# Patient Record
Sex: Female | Born: 1945
Health system: Southern US, Community
[De-identification: ages and names within clinical notes are randomized; demographics above are authoritative.]

## PROBLEM LIST (undated history)

## (undated) DIAGNOSIS — F419 Anxiety disorder, unspecified: Secondary | ICD-10-CM

## (undated) DIAGNOSIS — D490 Neoplasm of unspecified behavior of digestive system: Secondary | ICD-10-CM

## (undated) DIAGNOSIS — F329 Major depressive disorder, single episode, unspecified: Secondary | ICD-10-CM

## (undated) DIAGNOSIS — R0789 Other chest pain: Secondary | ICD-10-CM

## (undated) DIAGNOSIS — D126 Benign neoplasm of colon, unspecified: Secondary | ICD-10-CM

## (undated) DIAGNOSIS — E785 Hyperlipidemia, unspecified: Secondary | ICD-10-CM

## (undated) DIAGNOSIS — E041 Nontoxic single thyroid nodule: Secondary | ICD-10-CM

## (undated) DIAGNOSIS — F32A Depression, unspecified: Secondary | ICD-10-CM

## (undated) DIAGNOSIS — K5909 Other constipation: Secondary | ICD-10-CM

## (undated) DIAGNOSIS — N63 Unspecified lump in unspecified breast: Secondary | ICD-10-CM

## (undated) DIAGNOSIS — K219 Gastro-esophageal reflux disease without esophagitis: Secondary | ICD-10-CM

## (undated) DIAGNOSIS — D493 Neoplasm of unspecified behavior of breast: Secondary | ICD-10-CM

## (undated) DIAGNOSIS — F29 Unspecified psychosis not due to a substance or known physiological condition: Secondary | ICD-10-CM

## (undated) DIAGNOSIS — M549 Dorsalgia, unspecified: Secondary | ICD-10-CM

## (undated) DIAGNOSIS — G43909 Migraine, unspecified, not intractable, without status migrainosus: Secondary | ICD-10-CM

## (undated) DIAGNOSIS — T8859XA Other complications of anesthesia, initial encounter: Secondary | ICD-10-CM

## (undated) DIAGNOSIS — G4700 Insomnia, unspecified: Secondary | ICD-10-CM

## (undated) DIAGNOSIS — G4730 Sleep apnea, unspecified: Secondary | ICD-10-CM

## (undated) DIAGNOSIS — I10 Essential (primary) hypertension: Secondary | ICD-10-CM

## (undated) DIAGNOSIS — Z86018 Personal history of other benign neoplasm: Secondary | ICD-10-CM

## (undated) DIAGNOSIS — K5790 Diverticulosis of intestine, part unspecified, without perforation or abscess without bleeding: Secondary | ICD-10-CM

## (undated) DIAGNOSIS — N289 Disorder of kidney and ureter, unspecified: Secondary | ICD-10-CM

## (undated) DIAGNOSIS — G8929 Other chronic pain: Secondary | ICD-10-CM

## (undated) DIAGNOSIS — T4145XA Adverse effect of unspecified anesthetic, initial encounter: Secondary | ICD-10-CM

## (undated) DIAGNOSIS — M797 Fibromyalgia: Secondary | ICD-10-CM

## (undated) DIAGNOSIS — K449 Diaphragmatic hernia without obstruction or gangrene: Secondary | ICD-10-CM

## (undated) DIAGNOSIS — M359 Systemic involvement of connective tissue, unspecified: Secondary | ICD-10-CM

## (undated) DIAGNOSIS — Z9889 Other specified postprocedural states: Secondary | ICD-10-CM

## (undated) DIAGNOSIS — R112 Nausea with vomiting, unspecified: Secondary | ICD-10-CM

## (undated) HISTORY — PX: TONSILLECTOMY: SUR1361

## (undated) HISTORY — DX: Major depressive disorder, single episode, unspecified: F32.9

## (undated) HISTORY — PX: SHOULDER ARTHROSCOPY: SHX128

## (undated) HISTORY — DX: Other chest pain: R07.89

## (undated) HISTORY — DX: Benign neoplasm of colon, unspecified: D12.6

## (undated) HISTORY — DX: Personal history of other benign neoplasm: Z86.018

## (undated) HISTORY — DX: Depression, unspecified: F32.A

## (undated) HISTORY — PX: COLONOSCOPY: SHX174

## (undated) HISTORY — DX: Other constipation: K59.09

## (undated) HISTORY — DX: Nontoxic single thyroid nodule: E04.1

## (undated) HISTORY — DX: Hyperlipidemia, unspecified: E78.5

## (undated) HISTORY — PX: RIGHT OOPHORECTOMY: SHX2359

## (undated) HISTORY — PX: PARATHYROIDECTOMY: SHX19

## (undated) HISTORY — DX: Fibromyalgia: M79.7

## (undated) HISTORY — DX: Diaphragmatic hernia without obstruction or gangrene: K44.9

## (undated) HISTORY — PX: ABDOMINAL HYSTERECTOMY: SHX81

## (undated) HISTORY — DX: Unspecified psychosis not due to a substance or known physiological condition: F29

## (undated) HISTORY — DX: Dorsalgia, unspecified: M54.9

## (undated) HISTORY — DX: Insomnia, unspecified: G47.00

## (undated) HISTORY — DX: Neoplasm of unspecified behavior of breast: D49.3

## (undated) HISTORY — DX: Diverticulosis of intestine, part unspecified, without perforation or abscess without bleeding: K57.90

## (undated) HISTORY — DX: Anxiety disorder, unspecified: F41.9

## (undated) HISTORY — DX: Gastro-esophageal reflux disease without esophagitis: K21.9

## (undated) HISTORY — DX: Other chronic pain: G89.29

## (undated) HISTORY — DX: Essential (primary) hypertension: I10

## (undated) HISTORY — DX: Migraine, unspecified, not intractable, without status migrainosus: G43.909

## (undated) HISTORY — PX: LUNG BIOPSY: SHX232

## (undated) HISTORY — PX: BREAST EXCISIONAL BIOPSY: SUR124

---

## 1998-12-29 ENCOUNTER — Encounter: Payer: Self-pay | Admitting: *Deleted

## 1998-12-29 ENCOUNTER — Ambulatory Visit (HOSPITAL_COMMUNITY): Admission: RE | Admit: 1998-12-29 | Discharge: 1998-12-29 | Payer: Self-pay | Admitting: *Deleted

## 1999-04-16 ENCOUNTER — Ambulatory Visit (HOSPITAL_BASED_OUTPATIENT_CLINIC_OR_DEPARTMENT_OTHER): Admission: RE | Admit: 1999-04-16 | Discharge: 1999-04-16 | Payer: Self-pay | Admitting: Orthopedic Surgery

## 2000-06-24 HISTORY — PX: TOTAL KNEE ARTHROPLASTY: SHX125

## 2000-10-22 ENCOUNTER — Ambulatory Visit (HOSPITAL_COMMUNITY): Admission: RE | Admit: 2000-10-22 | Discharge: 2000-10-22 | Payer: Self-pay | Admitting: General Surgery

## 2000-10-22 ENCOUNTER — Encounter: Payer: Self-pay | Admitting: General Surgery

## 2000-12-09 ENCOUNTER — Encounter: Payer: Self-pay | Admitting: Internal Medicine

## 2000-12-09 ENCOUNTER — Ambulatory Visit (HOSPITAL_COMMUNITY): Admission: RE | Admit: 2000-12-09 | Discharge: 2000-12-09 | Payer: Self-pay | Admitting: Internal Medicine

## 2001-01-20 ENCOUNTER — Ambulatory Visit (HOSPITAL_COMMUNITY): Admission: RE | Admit: 2001-01-20 | Discharge: 2001-01-20 | Payer: Self-pay | Admitting: Internal Medicine

## 2001-01-20 ENCOUNTER — Encounter: Payer: Self-pay | Admitting: Internal Medicine

## 2001-02-10 ENCOUNTER — Encounter: Payer: Self-pay | Admitting: Specialist

## 2001-02-16 ENCOUNTER — Inpatient Hospital Stay (HOSPITAL_COMMUNITY): Admission: RE | Admit: 2001-02-16 | Discharge: 2001-02-21 | Payer: Self-pay | Admitting: Specialist

## 2001-02-16 ENCOUNTER — Encounter: Payer: Self-pay | Admitting: Specialist

## 2001-06-24 ENCOUNTER — Encounter: Payer: Self-pay | Admitting: Emergency Medicine

## 2001-06-24 ENCOUNTER — Emergency Department (HOSPITAL_COMMUNITY): Admission: EM | Admit: 2001-06-24 | Discharge: 2001-06-24 | Payer: Self-pay | Admitting: Emergency Medicine

## 2001-06-25 ENCOUNTER — Encounter: Payer: Self-pay | Admitting: *Deleted

## 2001-06-25 ENCOUNTER — Emergency Department (HOSPITAL_COMMUNITY): Admission: EM | Admit: 2001-06-25 | Discharge: 2001-06-25 | Payer: Self-pay | Admitting: *Deleted

## 2001-12-30 ENCOUNTER — Observation Stay (HOSPITAL_COMMUNITY): Admission: EM | Admit: 2001-12-30 | Discharge: 2002-01-01 | Payer: Self-pay | Admitting: Internal Medicine

## 2001-12-30 ENCOUNTER — Encounter: Payer: Self-pay | Admitting: Internal Medicine

## 2002-01-14 ENCOUNTER — Encounter (HOSPITAL_COMMUNITY): Admission: RE | Admit: 2002-01-14 | Discharge: 2002-02-13 | Payer: Self-pay | Admitting: Specialist

## 2002-01-25 ENCOUNTER — Encounter: Payer: Self-pay | Admitting: Emergency Medicine

## 2002-01-25 ENCOUNTER — Emergency Department (HOSPITAL_COMMUNITY): Admission: EM | Admit: 2002-01-25 | Discharge: 2002-01-25 | Payer: Self-pay | Admitting: Emergency Medicine

## 2002-02-09 ENCOUNTER — Encounter: Payer: Self-pay | Admitting: Emergency Medicine

## 2002-02-09 ENCOUNTER — Emergency Department (HOSPITAL_COMMUNITY): Admission: EM | Admit: 2002-02-09 | Discharge: 2002-02-09 | Payer: Self-pay | Admitting: Emergency Medicine

## 2002-07-30 ENCOUNTER — Encounter: Payer: Self-pay | Admitting: Internal Medicine

## 2002-07-30 ENCOUNTER — Ambulatory Visit (HOSPITAL_COMMUNITY): Admission: RE | Admit: 2002-07-30 | Discharge: 2002-07-30 | Payer: Self-pay | Admitting: Internal Medicine

## 2003-04-13 ENCOUNTER — Ambulatory Visit (HOSPITAL_COMMUNITY): Admission: RE | Admit: 2003-04-13 | Discharge: 2003-04-13 | Payer: Self-pay | Admitting: *Deleted

## 2003-04-13 ENCOUNTER — Encounter: Payer: Self-pay | Admitting: *Deleted

## 2003-04-29 ENCOUNTER — Ambulatory Visit (HOSPITAL_COMMUNITY): Admission: RE | Admit: 2003-04-29 | Discharge: 2003-04-29 | Payer: Self-pay | Admitting: Internal Medicine

## 2003-05-10 ENCOUNTER — Ambulatory Visit (HOSPITAL_COMMUNITY): Admission: RE | Admit: 2003-05-10 | Discharge: 2003-05-10 | Payer: Self-pay | Admitting: Internal Medicine

## 2003-09-19 ENCOUNTER — Ambulatory Visit (HOSPITAL_COMMUNITY): Admission: RE | Admit: 2003-09-19 | Discharge: 2003-09-19 | Payer: Self-pay | Admitting: Internal Medicine

## 2003-12-19 ENCOUNTER — Emergency Department (HOSPITAL_COMMUNITY): Admission: EM | Admit: 2003-12-19 | Discharge: 2003-12-19 | Payer: Self-pay | Admitting: Emergency Medicine

## 2004-01-20 ENCOUNTER — Ambulatory Visit (HOSPITAL_COMMUNITY): Admission: RE | Admit: 2004-01-20 | Discharge: 2004-01-20 | Payer: Self-pay | Admitting: Internal Medicine

## 2004-02-23 ENCOUNTER — Ambulatory Visit (HOSPITAL_COMMUNITY): Payer: Self-pay | Admitting: Psychiatry

## 2004-03-20 ENCOUNTER — Ambulatory Visit: Payer: Self-pay | Admitting: Psychiatry

## 2004-05-08 ENCOUNTER — Ambulatory Visit (HOSPITAL_COMMUNITY): Admission: RE | Admit: 2004-05-08 | Discharge: 2004-05-08 | Payer: Self-pay | Admitting: Family Medicine

## 2004-05-31 ENCOUNTER — Ambulatory Visit: Payer: Self-pay | Admitting: Psychiatry

## 2004-06-24 DIAGNOSIS — D126 Benign neoplasm of colon, unspecified: Secondary | ICD-10-CM

## 2004-06-24 HISTORY — DX: Benign neoplasm of colon, unspecified: D12.6

## 2004-08-28 ENCOUNTER — Ambulatory Visit (HOSPITAL_COMMUNITY): Admission: RE | Admit: 2004-08-28 | Discharge: 2004-08-28 | Payer: Self-pay | Admitting: Family Medicine

## 2004-09-20 ENCOUNTER — Ambulatory Visit (HOSPITAL_COMMUNITY): Admission: RE | Admit: 2004-09-20 | Discharge: 2004-09-20 | Payer: Self-pay | Admitting: Family Medicine

## 2004-10-04 ENCOUNTER — Ambulatory Visit: Payer: Self-pay | Admitting: Psychiatry

## 2004-10-12 ENCOUNTER — Ambulatory Visit (HOSPITAL_COMMUNITY): Admission: RE | Admit: 2004-10-12 | Discharge: 2004-10-12 | Payer: Self-pay | Admitting: Internal Medicine

## 2004-11-01 ENCOUNTER — Ambulatory Visit: Payer: Self-pay | Admitting: Orthopedic Surgery

## 2004-11-05 ENCOUNTER — Ambulatory Visit (HOSPITAL_COMMUNITY): Admission: RE | Admit: 2004-11-05 | Discharge: 2004-11-05 | Payer: Self-pay | Admitting: Orthopedic Surgery

## 2004-12-03 ENCOUNTER — Ambulatory Visit: Payer: Self-pay | Admitting: Orthopedic Surgery

## 2004-12-06 ENCOUNTER — Encounter (HOSPITAL_COMMUNITY): Admission: RE | Admit: 2004-12-06 | Discharge: 2005-01-05 | Payer: Self-pay | Admitting: Orthopedic Surgery

## 2004-12-13 ENCOUNTER — Ambulatory Visit: Payer: Self-pay | Admitting: Psychiatry

## 2005-01-07 ENCOUNTER — Encounter (HOSPITAL_COMMUNITY): Admission: RE | Admit: 2005-01-07 | Discharge: 2005-02-06 | Payer: Self-pay | Admitting: Orthopedic Surgery

## 2005-02-26 ENCOUNTER — Ambulatory Visit: Payer: Self-pay | Admitting: Psychiatry

## 2005-04-11 ENCOUNTER — Ambulatory Visit (HOSPITAL_COMMUNITY): Admission: RE | Admit: 2005-04-11 | Discharge: 2005-04-11 | Payer: Self-pay | Admitting: Family Medicine

## 2005-05-23 ENCOUNTER — Ambulatory Visit: Payer: Self-pay | Admitting: Psychiatry

## 2005-05-28 ENCOUNTER — Ambulatory Visit: Payer: Self-pay | Admitting: Internal Medicine

## 2005-06-05 ENCOUNTER — Encounter: Payer: Self-pay | Admitting: Internal Medicine

## 2005-06-05 ENCOUNTER — Ambulatory Visit (HOSPITAL_COMMUNITY): Admission: RE | Admit: 2005-06-05 | Discharge: 2005-06-05 | Payer: Self-pay | Admitting: Internal Medicine

## 2005-06-05 ENCOUNTER — Ambulatory Visit: Payer: Self-pay | Admitting: Internal Medicine

## 2005-07-24 ENCOUNTER — Ambulatory Visit: Payer: Self-pay | Admitting: Psychiatry

## 2005-07-30 ENCOUNTER — Ambulatory Visit: Payer: Self-pay | Admitting: Psychiatry

## 2005-08-30 ENCOUNTER — Ambulatory Visit (HOSPITAL_COMMUNITY): Payer: Self-pay | Admitting: Psychiatry

## 2005-09-16 ENCOUNTER — Ambulatory Visit (HOSPITAL_COMMUNITY): Payer: Self-pay | Admitting: Psychiatry

## 2005-09-17 ENCOUNTER — Ambulatory Visit (HOSPITAL_COMMUNITY): Payer: Self-pay | Admitting: Psychiatry

## 2005-09-23 ENCOUNTER — Ambulatory Visit (HOSPITAL_COMMUNITY): Admission: RE | Admit: 2005-09-23 | Discharge: 2005-09-23 | Payer: Self-pay | Admitting: Family Medicine

## 2005-10-08 ENCOUNTER — Ambulatory Visit (HOSPITAL_COMMUNITY): Payer: Self-pay | Admitting: Psychiatry

## 2005-11-01 ENCOUNTER — Ambulatory Visit: Payer: Self-pay | Admitting: Internal Medicine

## 2005-11-08 ENCOUNTER — Ambulatory Visit (HOSPITAL_COMMUNITY): Payer: Self-pay | Admitting: Psychiatry

## 2005-11-21 ENCOUNTER — Ambulatory Visit (HOSPITAL_COMMUNITY): Payer: Self-pay | Admitting: Psychiatry

## 2005-12-06 ENCOUNTER — Ambulatory Visit (HOSPITAL_COMMUNITY): Payer: Self-pay | Admitting: Psychiatry

## 2006-01-06 ENCOUNTER — Ambulatory Visit (HOSPITAL_COMMUNITY): Payer: Self-pay | Admitting: Psychiatry

## 2006-01-16 ENCOUNTER — Ambulatory Visit (HOSPITAL_COMMUNITY): Payer: Self-pay | Admitting: Psychiatry

## 2006-02-13 ENCOUNTER — Ambulatory Visit (HOSPITAL_COMMUNITY): Payer: Self-pay | Admitting: Psychiatry

## 2006-03-18 ENCOUNTER — Ambulatory Visit (HOSPITAL_COMMUNITY): Payer: Self-pay | Admitting: Psychiatry

## 2006-03-25 ENCOUNTER — Ambulatory Visit (HOSPITAL_COMMUNITY): Payer: Self-pay | Admitting: Psychiatry

## 2006-04-29 ENCOUNTER — Ambulatory Visit (HOSPITAL_COMMUNITY): Payer: Self-pay | Admitting: Psychiatry

## 2006-05-13 ENCOUNTER — Ambulatory Visit (HOSPITAL_COMMUNITY): Payer: Self-pay | Admitting: Psychiatry

## 2006-05-29 ENCOUNTER — Ambulatory Visit (HOSPITAL_COMMUNITY): Payer: Self-pay | Admitting: Psychiatry

## 2006-06-12 ENCOUNTER — Ambulatory Visit (HOSPITAL_COMMUNITY): Payer: Self-pay | Admitting: Psychiatry

## 2006-06-27 ENCOUNTER — Ambulatory Visit (HOSPITAL_COMMUNITY): Payer: Self-pay | Admitting: Psychiatry

## 2006-07-30 ENCOUNTER — Ambulatory Visit (HOSPITAL_COMMUNITY): Payer: Self-pay | Admitting: Psychiatry

## 2006-08-12 ENCOUNTER — Ambulatory Visit (HOSPITAL_COMMUNITY): Payer: Self-pay | Admitting: Psychiatry

## 2006-08-27 ENCOUNTER — Ambulatory Visit (HOSPITAL_COMMUNITY): Payer: Self-pay | Admitting: Psychiatry

## 2006-09-23 ENCOUNTER — Ambulatory Visit (HOSPITAL_COMMUNITY): Payer: Self-pay | Admitting: Psychiatry

## 2006-10-01 ENCOUNTER — Ambulatory Visit (HOSPITAL_COMMUNITY): Admission: RE | Admit: 2006-10-01 | Discharge: 2006-10-01 | Payer: Self-pay | Admitting: Family Medicine

## 2006-10-07 ENCOUNTER — Ambulatory Visit (HOSPITAL_COMMUNITY): Payer: Self-pay | Admitting: Psychiatry

## 2006-10-21 ENCOUNTER — Ambulatory Visit (HOSPITAL_COMMUNITY): Payer: Self-pay | Admitting: Psychiatry

## 2006-11-21 ENCOUNTER — Ambulatory Visit (HOSPITAL_COMMUNITY): Payer: Self-pay | Admitting: Psychiatry

## 2006-12-19 ENCOUNTER — Ambulatory Visit (HOSPITAL_COMMUNITY): Payer: Self-pay | Admitting: Psychiatry

## 2006-12-30 ENCOUNTER — Ambulatory Visit (HOSPITAL_COMMUNITY): Payer: Self-pay | Admitting: Psychiatry

## 2007-01-16 ENCOUNTER — Ambulatory Visit (HOSPITAL_COMMUNITY): Payer: Self-pay | Admitting: Psychiatry

## 2007-01-27 ENCOUNTER — Ambulatory Visit (HOSPITAL_COMMUNITY): Payer: Self-pay | Admitting: Psychiatry

## 2007-02-13 ENCOUNTER — Ambulatory Visit (HOSPITAL_COMMUNITY): Payer: Self-pay | Admitting: Psychiatry

## 2007-02-26 ENCOUNTER — Ambulatory Visit (HOSPITAL_COMMUNITY): Payer: Self-pay | Admitting: Psychiatry

## 2007-03-02 ENCOUNTER — Ambulatory Visit (HOSPITAL_COMMUNITY): Payer: Self-pay | Admitting: Psychiatry

## 2007-03-26 ENCOUNTER — Ambulatory Visit (HOSPITAL_COMMUNITY): Payer: Self-pay | Admitting: Psychiatry

## 2007-03-30 ENCOUNTER — Ambulatory Visit (HOSPITAL_COMMUNITY): Payer: Self-pay | Admitting: Psychiatry

## 2007-05-08 ENCOUNTER — Ambulatory Visit (HOSPITAL_COMMUNITY): Payer: Self-pay | Admitting: Psychiatry

## 2007-05-18 ENCOUNTER — Ambulatory Visit (HOSPITAL_COMMUNITY): Admission: RE | Admit: 2007-05-18 | Discharge: 2007-05-18 | Payer: Self-pay | Admitting: Family Medicine

## 2007-05-19 ENCOUNTER — Ambulatory Visit (HOSPITAL_COMMUNITY): Payer: Self-pay | Admitting: Psychiatry

## 2007-05-26 ENCOUNTER — Ambulatory Visit (HOSPITAL_COMMUNITY): Payer: Self-pay | Admitting: Psychiatry

## 2007-06-03 ENCOUNTER — Ambulatory Visit (HOSPITAL_COMMUNITY): Payer: Self-pay | Admitting: Psychiatry

## 2007-06-24 ENCOUNTER — Ambulatory Visit (HOSPITAL_COMMUNITY): Payer: Self-pay | Admitting: Psychiatry

## 2007-07-08 ENCOUNTER — Ambulatory Visit (HOSPITAL_COMMUNITY): Payer: Self-pay | Admitting: Psychiatry

## 2007-07-09 ENCOUNTER — Ambulatory Visit (HOSPITAL_COMMUNITY): Payer: Self-pay | Admitting: Psychiatry

## 2007-07-29 ENCOUNTER — Ambulatory Visit (HOSPITAL_COMMUNITY): Payer: Self-pay | Admitting: Psychiatry

## 2007-08-06 ENCOUNTER — Ambulatory Visit (HOSPITAL_COMMUNITY): Payer: Self-pay | Admitting: Psychiatry

## 2007-09-01 ENCOUNTER — Ambulatory Visit (HOSPITAL_COMMUNITY): Payer: Self-pay | Admitting: Psychiatry

## 2007-09-17 ENCOUNTER — Ambulatory Visit (HOSPITAL_COMMUNITY): Payer: Self-pay | Admitting: Psychiatry

## 2007-09-28 ENCOUNTER — Ambulatory Visit (HOSPITAL_COMMUNITY): Payer: Self-pay | Admitting: Psychiatry

## 2007-10-02 ENCOUNTER — Ambulatory Visit (HOSPITAL_COMMUNITY): Admission: RE | Admit: 2007-10-02 | Discharge: 2007-10-02 | Payer: Self-pay | Admitting: Family Medicine

## 2007-10-27 ENCOUNTER — Ambulatory Visit (HOSPITAL_COMMUNITY): Payer: Self-pay | Admitting: Psychiatry

## 2007-11-17 ENCOUNTER — Ambulatory Visit (HOSPITAL_COMMUNITY): Payer: Self-pay | Admitting: Psychiatry

## 2007-11-23 ENCOUNTER — Ambulatory Visit (HOSPITAL_COMMUNITY): Payer: Self-pay | Admitting: Psychiatry

## 2007-12-23 ENCOUNTER — Ambulatory Visit (HOSPITAL_COMMUNITY): Payer: Self-pay | Admitting: Psychiatry

## 2008-01-14 ENCOUNTER — Ambulatory Visit (HOSPITAL_COMMUNITY): Payer: Self-pay | Admitting: Psychiatry

## 2008-01-27 ENCOUNTER — Ambulatory Visit (HOSPITAL_COMMUNITY): Payer: Self-pay | Admitting: Psychiatry

## 2008-02-09 ENCOUNTER — Ambulatory Visit (HOSPITAL_COMMUNITY): Payer: Self-pay | Admitting: Psychiatry

## 2008-02-23 ENCOUNTER — Ambulatory Visit (HOSPITAL_COMMUNITY): Payer: Self-pay | Admitting: Psychiatry

## 2008-03-22 ENCOUNTER — Ambulatory Visit (HOSPITAL_COMMUNITY): Payer: Self-pay | Admitting: Psychiatry

## 2008-04-07 ENCOUNTER — Ambulatory Visit (HOSPITAL_COMMUNITY): Payer: Self-pay | Admitting: Psychiatry

## 2008-04-21 ENCOUNTER — Ambulatory Visit (HOSPITAL_COMMUNITY): Payer: Self-pay | Admitting: Psychiatry

## 2008-05-18 ENCOUNTER — Ambulatory Visit (HOSPITAL_COMMUNITY): Payer: Self-pay | Admitting: Psychiatry

## 2008-06-09 ENCOUNTER — Ambulatory Visit: Payer: Self-pay | Admitting: Internal Medicine

## 2008-06-13 ENCOUNTER — Ambulatory Visit (HOSPITAL_COMMUNITY): Payer: Self-pay | Admitting: Psychiatry

## 2008-06-24 DIAGNOSIS — Z86018 Personal history of other benign neoplasm: Secondary | ICD-10-CM

## 2008-06-24 HISTORY — DX: Personal history of other benign neoplasm: Z86.018

## 2008-06-28 ENCOUNTER — Ambulatory Visit (HOSPITAL_COMMUNITY): Payer: Self-pay | Admitting: Psychiatry

## 2008-07-04 ENCOUNTER — Ambulatory Visit (HOSPITAL_COMMUNITY): Admission: RE | Admit: 2008-07-04 | Discharge: 2008-07-04 | Payer: Self-pay | Admitting: Internal Medicine

## 2008-07-04 ENCOUNTER — Encounter: Payer: Self-pay | Admitting: Internal Medicine

## 2008-07-04 ENCOUNTER — Ambulatory Visit: Payer: Self-pay | Admitting: Internal Medicine

## 2008-07-11 ENCOUNTER — Ambulatory Visit (HOSPITAL_COMMUNITY): Payer: Self-pay | Admitting: Psychiatry

## 2008-08-01 ENCOUNTER — Ambulatory Visit (HOSPITAL_COMMUNITY): Payer: Self-pay | Admitting: Psychiatry

## 2008-08-25 ENCOUNTER — Ambulatory Visit: Payer: Self-pay | Admitting: Cardiology

## 2008-08-25 ENCOUNTER — Encounter (INDEPENDENT_AMBULATORY_CARE_PROVIDER_SITE_OTHER): Payer: Self-pay | Admitting: *Deleted

## 2008-08-25 LAB — CONVERTED CEMR LAB
ALT: 12 units/L
ALT: 12 units/L
AST: 16 units/L
AST: 16 units/L
Albumin: 4.5 g/dL
Albumin: 4.5 g/dL
Alkaline Phosphatase: 58 units/L
Alkaline Phosphatase: 58 units/L
BUN: 6 mg/dL
BUN: 6 mg/dL
CO2: 23 meq/L
CO2: 23 meq/L
Calcium: 9.8 mg/dL
Calcium: 9.8 mg/dL
Chloride: 104 meq/L
Chloride: 104 meq/L
Creatinine, Ser: 0.79 mg/dL
Creatinine, Ser: 0.8 mg/dL
Glucose, Bld: 87 mg/dL
Glucose, Bld: 87 mg/dL
HCT: 39.4 %
HCT: 39.4 %
Hemoglobin: 13.4 g/dL
Hemoglobin: 13.4 g/dL
MCV: 81 fL
MCV: 81.1 fL
Platelets: 194 10*3/uL
Platelets: 194 10*3/uL
Potassium: 3.6 meq/L
Potassium: 3.6 meq/L
Sodium: 142 meq/L
Sodium: 142 meq/L
TSH: 1.234 microintl units/mL
Total Protein: 6.9 g/dL
Total Protein: 6.9 g/dL
WBC: 5 10*3/uL
WBC: 5 10*3/uL

## 2008-09-05 ENCOUNTER — Ambulatory Visit (HOSPITAL_COMMUNITY): Payer: Self-pay | Admitting: Psychiatry

## 2008-09-07 ENCOUNTER — Ambulatory Visit (HOSPITAL_COMMUNITY): Admission: RE | Admit: 2008-09-07 | Discharge: 2008-09-07 | Payer: Self-pay | Admitting: Cardiology

## 2008-09-07 ENCOUNTER — Ambulatory Visit: Payer: Self-pay | Admitting: Cardiology

## 2008-09-07 ENCOUNTER — Encounter: Payer: Self-pay | Admitting: Cardiology

## 2008-09-20 ENCOUNTER — Ambulatory Visit: Payer: Self-pay | Admitting: Cardiology

## 2008-09-26 ENCOUNTER — Ambulatory Visit (HOSPITAL_COMMUNITY): Payer: Self-pay | Admitting: Psychiatry

## 2008-09-28 ENCOUNTER — Ambulatory Visit: Payer: Self-pay | Admitting: Cardiology

## 2008-09-29 ENCOUNTER — Ambulatory Visit (HOSPITAL_COMMUNITY): Payer: Self-pay | Admitting: Psychiatry

## 2008-10-03 ENCOUNTER — Ambulatory Visit (HOSPITAL_COMMUNITY): Admission: RE | Admit: 2008-10-03 | Discharge: 2008-10-03 | Payer: Self-pay | Admitting: Obstetrics & Gynecology

## 2008-11-01 ENCOUNTER — Ambulatory Visit (HOSPITAL_COMMUNITY): Payer: Self-pay | Admitting: Psychiatry

## 2008-11-29 ENCOUNTER — Ambulatory Visit (HOSPITAL_COMMUNITY): Payer: Self-pay | Admitting: Psychiatry

## 2009-01-03 ENCOUNTER — Ambulatory Visit (HOSPITAL_COMMUNITY): Payer: Self-pay | Admitting: Psychiatry

## 2009-01-18 ENCOUNTER — Ambulatory Visit (HOSPITAL_COMMUNITY): Payer: Self-pay | Admitting: Psychiatry

## 2009-01-25 ENCOUNTER — Encounter: Payer: Self-pay | Admitting: Gastroenterology

## 2009-02-15 ENCOUNTER — Ambulatory Visit (HOSPITAL_COMMUNITY): Payer: Self-pay | Admitting: Psychiatry

## 2009-03-22 ENCOUNTER — Ambulatory Visit (HOSPITAL_COMMUNITY): Payer: Self-pay | Admitting: Psychiatry

## 2009-04-19 ENCOUNTER — Ambulatory Visit (HOSPITAL_COMMUNITY): Payer: Self-pay | Admitting: Psychiatry

## 2009-04-25 ENCOUNTER — Ambulatory Visit (HOSPITAL_COMMUNITY): Payer: Self-pay | Admitting: Psychiatry

## 2009-05-15 ENCOUNTER — Ambulatory Visit (HOSPITAL_COMMUNITY): Payer: Self-pay | Admitting: Psychiatry

## 2009-06-12 ENCOUNTER — Ambulatory Visit (HOSPITAL_COMMUNITY): Payer: Self-pay | Admitting: Psychiatry

## 2009-06-15 LAB — CONVERTED CEMR LAB
ALT: 12 units/L
AST: 18 units/L
Albumin: 4.4 g/dL
Alkaline Phosphatase: 73 units/L
Bilirubin, Direct: 0.19 mg/dL
Cholesterol: 176 mg/dL
HDL: 65 mg/dL
LDL Cholesterol: 99 mg/dL
Total Protein: 6.6 g/dL
Triglycerides: 58 mg/dL

## 2009-07-10 ENCOUNTER — Ambulatory Visit (HOSPITAL_COMMUNITY): Payer: Self-pay | Admitting: Psychiatry

## 2009-07-25 ENCOUNTER — Ambulatory Visit (HOSPITAL_COMMUNITY): Payer: Self-pay | Admitting: Psychiatry

## 2009-08-04 ENCOUNTER — Encounter: Payer: Self-pay | Admitting: Internal Medicine

## 2009-08-04 ENCOUNTER — Ambulatory Visit (HOSPITAL_COMMUNITY): Payer: Self-pay | Admitting: Psychiatry

## 2009-08-16 DIAGNOSIS — I10 Essential (primary) hypertension: Secondary | ICD-10-CM | POA: Insufficient documentation

## 2009-08-16 DIAGNOSIS — E041 Nontoxic single thyroid nodule: Secondary | ICD-10-CM | POA: Insufficient documentation

## 2009-08-16 DIAGNOSIS — M1990 Unspecified osteoarthritis, unspecified site: Secondary | ICD-10-CM | POA: Insufficient documentation

## 2009-08-16 DIAGNOSIS — K219 Gastro-esophageal reflux disease without esophagitis: Secondary | ICD-10-CM | POA: Insufficient documentation

## 2009-08-16 DIAGNOSIS — F32A Depression, unspecified: Secondary | ICD-10-CM | POA: Insufficient documentation

## 2009-08-16 DIAGNOSIS — M549 Dorsalgia, unspecified: Secondary | ICD-10-CM | POA: Insufficient documentation

## 2009-08-16 DIAGNOSIS — IMO0001 Reserved for inherently not codable concepts without codable children: Secondary | ICD-10-CM | POA: Insufficient documentation

## 2009-08-16 DIAGNOSIS — F419 Anxiety disorder, unspecified: Secondary | ICD-10-CM

## 2009-08-16 DIAGNOSIS — F329 Major depressive disorder, single episode, unspecified: Secondary | ICD-10-CM | POA: Insufficient documentation

## 2009-08-22 ENCOUNTER — Ambulatory Visit: Payer: Self-pay | Admitting: Cardiology

## 2009-08-22 DIAGNOSIS — E782 Mixed hyperlipidemia: Secondary | ICD-10-CM | POA: Insufficient documentation

## 2009-08-22 DIAGNOSIS — E785 Hyperlipidemia, unspecified: Secondary | ICD-10-CM | POA: Insufficient documentation

## 2009-08-22 DIAGNOSIS — R002 Palpitations: Secondary | ICD-10-CM | POA: Insufficient documentation

## 2009-08-28 ENCOUNTER — Encounter: Payer: Self-pay | Admitting: Cardiology

## 2009-08-31 ENCOUNTER — Encounter: Payer: Self-pay | Admitting: Internal Medicine

## 2009-09-01 ENCOUNTER — Ambulatory Visit (HOSPITAL_COMMUNITY): Payer: Self-pay | Admitting: Psychiatry

## 2009-09-29 ENCOUNTER — Ambulatory Visit (HOSPITAL_COMMUNITY): Payer: Self-pay | Admitting: Psychiatry

## 2009-10-10 ENCOUNTER — Ambulatory Visit (HOSPITAL_COMMUNITY): Admission: RE | Admit: 2009-10-10 | Discharge: 2009-10-10 | Payer: Self-pay | Admitting: Obstetrics & Gynecology

## 2009-10-18 ENCOUNTER — Encounter: Payer: Self-pay | Admitting: Internal Medicine

## 2009-10-20 ENCOUNTER — Encounter (INDEPENDENT_AMBULATORY_CARE_PROVIDER_SITE_OTHER): Payer: Self-pay | Admitting: *Deleted

## 2009-10-24 ENCOUNTER — Ambulatory Visit (HOSPITAL_COMMUNITY): Payer: Self-pay | Admitting: Psychiatry

## 2009-10-30 ENCOUNTER — Ambulatory Visit (HOSPITAL_COMMUNITY): Payer: Self-pay | Admitting: Psychiatry

## 2009-10-31 ENCOUNTER — Ambulatory Visit: Payer: Self-pay | Admitting: Internal Medicine

## 2009-11-06 ENCOUNTER — Emergency Department (HOSPITAL_COMMUNITY): Admission: EM | Admit: 2009-11-06 | Discharge: 2009-11-06 | Payer: Self-pay | Admitting: Emergency Medicine

## 2009-11-09 DIAGNOSIS — D375 Neoplasm of uncertain behavior of rectum: Secondary | ICD-10-CM

## 2009-11-09 DIAGNOSIS — D371 Neoplasm of uncertain behavior of stomach: Secondary | ICD-10-CM | POA: Insufficient documentation

## 2009-11-09 DIAGNOSIS — K5900 Constipation, unspecified: Secondary | ICD-10-CM | POA: Insufficient documentation

## 2009-11-09 DIAGNOSIS — D378 Neoplasm of uncertain behavior of other specified digestive organs: Secondary | ICD-10-CM

## 2009-11-09 DIAGNOSIS — R1310 Dysphagia, unspecified: Secondary | ICD-10-CM | POA: Insufficient documentation

## 2009-11-10 ENCOUNTER — Ambulatory Visit (HOSPITAL_COMMUNITY): Admission: RE | Admit: 2009-11-10 | Discharge: 2009-11-10 | Payer: Self-pay | Admitting: Internal Medicine

## 2009-11-10 ENCOUNTER — Encounter: Payer: Self-pay | Admitting: Internal Medicine

## 2009-11-14 ENCOUNTER — Telehealth (INDEPENDENT_AMBULATORY_CARE_PROVIDER_SITE_OTHER): Payer: Self-pay

## 2009-11-15 ENCOUNTER — Encounter: Payer: Self-pay | Admitting: Internal Medicine

## 2009-11-15 ENCOUNTER — Encounter (INDEPENDENT_AMBULATORY_CARE_PROVIDER_SITE_OTHER): Payer: Self-pay | Admitting: *Deleted

## 2009-11-23 ENCOUNTER — Encounter: Payer: Self-pay | Admitting: Gastroenterology

## 2009-11-27 ENCOUNTER — Ambulatory Visit (HOSPITAL_COMMUNITY): Payer: Self-pay | Admitting: Psychiatry

## 2009-12-26 ENCOUNTER — Ambulatory Visit (HOSPITAL_COMMUNITY): Payer: Self-pay | Admitting: Psychiatry

## 2010-01-23 ENCOUNTER — Ambulatory Visit (HOSPITAL_COMMUNITY): Payer: Self-pay | Admitting: Psychiatry

## 2010-02-02 ENCOUNTER — Ambulatory Visit (HOSPITAL_COMMUNITY): Payer: Self-pay | Admitting: Psychiatry

## 2010-02-27 ENCOUNTER — Encounter (INDEPENDENT_AMBULATORY_CARE_PROVIDER_SITE_OTHER): Payer: Self-pay | Admitting: *Deleted

## 2010-03-02 ENCOUNTER — Ambulatory Visit (HOSPITAL_COMMUNITY): Payer: Self-pay | Admitting: Psychiatry

## 2010-03-27 ENCOUNTER — Ambulatory Visit (HOSPITAL_COMMUNITY): Payer: Self-pay | Admitting: Psychiatry

## 2010-03-30 ENCOUNTER — Ambulatory Visit (HOSPITAL_COMMUNITY): Payer: Self-pay | Admitting: Psychiatry

## 2010-04-02 ENCOUNTER — Ambulatory Visit (HOSPITAL_COMMUNITY): Admission: RE | Admit: 2010-04-02 | Discharge: 2010-04-02 | Payer: Self-pay | Admitting: Internal Medicine

## 2010-04-03 ENCOUNTER — Ambulatory Visit: Payer: Self-pay | Admitting: Internal Medicine

## 2010-04-27 ENCOUNTER — Ambulatory Visit (HOSPITAL_COMMUNITY): Payer: Self-pay | Admitting: Psychiatry

## 2010-05-01 ENCOUNTER — Ambulatory Visit: Payer: Self-pay | Admitting: Orthopedic Surgery

## 2010-05-01 DIAGNOSIS — M5137 Other intervertebral disc degeneration, lumbosacral region: Secondary | ICD-10-CM | POA: Insufficient documentation

## 2010-05-01 DIAGNOSIS — M7660 Achilles tendinitis, unspecified leg: Secondary | ICD-10-CM | POA: Insufficient documentation

## 2010-05-01 DIAGNOSIS — IMO0002 Reserved for concepts with insufficient information to code with codable children: Secondary | ICD-10-CM | POA: Insufficient documentation

## 2010-05-01 DIAGNOSIS — M51379 Other intervertebral disc degeneration, lumbosacral region without mention of lumbar back pain or lower extremity pain: Secondary | ICD-10-CM | POA: Insufficient documentation

## 2010-05-01 DIAGNOSIS — M1710 Unilateral primary osteoarthritis, unspecified knee: Secondary | ICD-10-CM

## 2010-05-10 ENCOUNTER — Encounter (HOSPITAL_COMMUNITY)
Admission: RE | Admit: 2010-05-10 | Discharge: 2010-06-09 | Payer: Self-pay | Source: Home / Self Care | Attending: Orthopedic Surgery | Admitting: Orthopedic Surgery

## 2010-05-23 ENCOUNTER — Encounter: Payer: Self-pay | Admitting: Orthopedic Surgery

## 2010-06-11 ENCOUNTER — Encounter (HOSPITAL_COMMUNITY)
Admission: RE | Admit: 2010-06-11 | Discharge: 2010-07-11 | Payer: Self-pay | Source: Home / Self Care | Attending: Orthopedic Surgery | Admitting: Orthopedic Surgery

## 2010-06-13 ENCOUNTER — Encounter: Payer: Self-pay | Admitting: Orthopedic Surgery

## 2010-06-14 ENCOUNTER — Ambulatory Visit (HOSPITAL_COMMUNITY): Payer: Self-pay | Admitting: Psychiatry

## 2010-06-26 ENCOUNTER — Ambulatory Visit (HOSPITAL_COMMUNITY)
Admission: RE | Admit: 2010-06-26 | Discharge: 2010-06-26 | Payer: Self-pay | Source: Home / Self Care | Attending: Psychiatry | Admitting: Psychiatry

## 2010-07-10 ENCOUNTER — Encounter (INDEPENDENT_AMBULATORY_CARE_PROVIDER_SITE_OTHER): Payer: Self-pay | Admitting: *Deleted

## 2010-07-12 ENCOUNTER — Ambulatory Visit (HOSPITAL_COMMUNITY)
Admission: RE | Admit: 2010-07-12 | Discharge: 2010-07-12 | Payer: Self-pay | Source: Home / Self Care | Attending: Psychiatry | Admitting: Psychiatry

## 2010-07-14 ENCOUNTER — Encounter: Payer: Self-pay | Admitting: Internal Medicine

## 2010-07-15 ENCOUNTER — Encounter: Payer: Self-pay | Admitting: Obstetrics & Gynecology

## 2010-07-24 NOTE — Medication Information (Signed)
Summary: Tax adviser   Imported By: Diana Eves 01/25/2009 13:42:19  _____________________________________________________________________  External Attachment:    Type:   Image     Comment:   External Document  Appended Document: RX Folder - dexilant    Prescriptions: DEXILANT 60 MG CPDR (DEXLANSOPRAZOLE) one by mouth daily  #30 x 11   Entered and Authorized by:   Leanna Battles. Dixon Boos   Signed by:   Leanna Battles Dixon Boos on 01/25/2009   Method used:   Electronically to        [facility] (retail)       [address]       [place], [place]  [address]       Ph: [phone] or [phone]       Fax: [phone]   RxID:   [id number]

## 2010-07-24 NOTE — Assessment & Plan Note (Signed)
Summary: FU OV IN 3MONTHS,DYSPHAGIA,CONSTIPATION/SS   Visit Type:  Follow-up Visit Primary Care Provider:  Fanta  Chief Complaint:  F/U dysphagia/constipation.  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 She is due for repeat EGD to first 2012. She's had some dysphagia and odynophagia. Maybe reflux. She has been on AcipHex 20 mg orally daily which has been better than other proton pump inhibitors. Also progressive constipation going upwards of a week without a bowel movement. History of colonic adenoma; she"s due f/u colonoscopy 2015.  Sometimes waites up to 3 days before taking MiraLax. No rectal bleeding.    Current Medications (verified): 1)  Zolpidem Tartrate 10 Mg Tabs (Zolpidem Tartrate) .... Take 1 Tab At Bedtime 2)  Amlodipine Besylate 5 Mg Tabs (Amlodipine Besylate) .... Take 1 Tab Daily 3)  Cymbalta 60 Mg Cpep (Duloxetine Hcl) .... Take 1 Cap Daily 4)  Lovastatin 20 Mg Tabs (Lovastatin) .... Take 1 Tab Daily 5)  Neurontin 300 Mg Caps (Gabapentin) .... Take 1 Cap Am 4 Caps Pm 6)  Benazepril-Hydrochlorothiazide 20-12.5 Mg Tabs (Benazepril-Hydrochlorothiazide) .... Take 1 Tab Two Times A Day 7)  Atenolol 50 Mg Tabs (Atenolol) .... Take 2 Tablets in Am and 1 Tablet in The Pm 8)  Hydrocodone-Acetaminophen 5-500 Mg Tabs (Hydrocodone-Acetaminophen) .... Take As Needed For Pain 9)  Aciphex 20 Mg Tbec (Rabeprazole Sodium) .... One By Mouth Before Breakfast Daily 10)  Zyrtec Hives Relief 10 Mg Tabs (Cetirizine Hcl) .... Take 1 Tablet By Mouth Once A Day 11)  Miralax  Powd (Polyethylene Glycol 3350) .... One Dose Daily 12)  Benefiber .... Twice Daily 13)  Antibiotic (For Uti ) .... Take 1 Tablet By Mouth Two Times A Day For 5 Days  Allergies (verified): 1)  ! Penicillin 2)  ! Codeine 3)  ! Sulfa  Past History:  Past Medical History: Last updated: 08/22/2009 Chest  pain Hypertension Hyperlipidemia THYROID NODULE (ICD-241.0) COLONIC POLYPS, ADENOMATOUS (ICD-211.3)-excised during colonoscopy in 2006 & 2010 GERD (ICD-530.81); diverticular disease BACK PAIN, CHRONIC (ICD-724.5) ANXIETY DEPRESSION (ICD-300.4) OSTEOARTHRITIS (ICD-715.90) FIBROMYALGIA (ICD-729.1)  Past Surgical History: Last updated: 08/22/2009 Her right total knee arthroplasty in 2002 Her right knee cartilage repair-1999 Right shoulder surgery for bone spurs Excisional biopsy for benign disease of the left breast Tonsillectomy Hysterectomy Right oophorectomy due to cyst Lung biopsy-negative  Family History: Last updated: Sep 14, 2009 Father:deceased age 75 due to stroke Mother:deceased age 80 myocardial infarction  Social History: Last updated: 14-Sep-2009 Retired  Tobacco Use - No.  Alcohol Use - no Regular Exercise - no Drug Use - no  Risk Factors: Exercise: no (09/14/2009)  Risk Factors: Smoking Status: never (14-Sep-2009)  Vital Signs:  Patient profile:   65 year old female Height:      63 inches Weight:      204 pounds BMI:     36.27 Temp:     98.1 degrees F oral Pulse rate:   60 / minute BP sitting:   130 / 80  (left arm) Cuff size:   large  Vitals Entered By: Cloria Spring LPN (April 03, 2010 10:56 AM)  Physical Exam  General:  alert conversant no acute distress Lungs:  clear to auscultation Heart:  regular and rhythm without murmur gallop rub Abdomen:  nondistended positive bowel sounds soft nontender without appreciable mass or organomegaly  Impression & Recommendations: Impression: A 65 year old lady with long-standing GERD better controlled with AcipHex compared to any other agent tried previously;  now has persistent and prominent symptoms of dysphagia and odynaphagia - status post granular cell tumor removal from her  esophagus. She's slated to have a repeat EGD in February at [facility]. Poorly controlled  constipation. History of colonic  adenoma.  Recommendations: Continue AcipHex 20 mg orally daily. Add Carafate suspension 1 g q.i.d.  I've asked the patient to  contact with Dr. Margaretha Glassing over at [facility] to be seen earlier than February for consideration of a followup EGD sooner given ongoing symptoms.  As far as management of constipation is concerned, Would utilize polyethylene glycol 17 gram orally nightly if no bowel movement on any given day so she will not get get so far behind; if she has a bowel movement she may leave the dose of MiraLax off that day.  She is to continue Benefiber 1 tablespoon daily  She is to keep a stool diary.  Surveillance colonoscopy given history of polyps 2015.  O/V here 3 months.  Other Orders: Est. Patient Level IV ([address])

## 2010-07-24 NOTE — Miscellaneous (Signed)
Summary: PT clinical evaluation  PT clinical evaluation   Imported By: Jacklynn Ganong 05/24/2010 09:59:46  _____________________________________________________________________  External Attachment:    Type:   Image     Comment:   External Document

## 2010-07-24 NOTE — Miscellaneous (Signed)
Summary: LABS CBCD,CMP,TSH,08/25/2008  Clinical Lists Changes  Observations: Added new observation of CALCIUM: 9.8 mg/dL (65/78/4696 29:52) Added new observation of ALBUMIN: 4.5 g/dL (84/13/2440 10:27) Added new observation of PROTEIN, TOT: 6.9 g/dL (25/36/6440 34:74) Added new observation of SGPT (ALT): 12 units/L (08/25/2008 10:51) Added new observation of SGOT (AST): 16 units/L (08/25/2008 10:51) Added new observation of ALK PHOS: 58 units/L (08/25/2008 10:51) Added new observation of CREATININE: 0.79 mg/dL (25/95/6387 56:43) Added new observation of BUN: 6 mg/dL (32/95/1884 16:60) Added new observation of BG RANDOM: 87 mg/dL (63/06/6008 93:23) Added new observation of CO2 PLSM/SER: 23 meq/L (08/25/2008 10:51) Added new observation of CL SERUM: 104 meq/L (08/25/2008 10:51) Added new observation of K SERUM: 3.6 meq/L (08/25/2008 10:51) Added new observation of NA: 142 meq/L (08/25/2008 10:51) Added new observation of PLATELETK/UL: 194 K/uL (08/25/2008 10:51) Added new observation of MCV: 81.1 fL (08/25/2008 10:51) Added new observation of HCT: 39.4 % (08/25/2008 10:51) Added new observation of HGB: 13.4 g/dL (55/73/2202 54:27) Added new observation of WBC COUNT: 5.0 10*3/microliter (08/25/2008 10:51) Added new observation of TSH: 1.234 microintl units/mL (08/25/2008 10:51)

## 2010-07-24 NOTE — Progress Notes (Signed)
Summary: Progress note  Progress note   Imported By: Jacklynn Ganong 04/30/2010 11:06:57  _____________________________________________________________________  External Attachment:    Type:   Image     Comment:   External Document

## 2010-07-24 NOTE — Medication Information (Signed)
Summary: PA for aciphex  PA for aciphex   Imported By: Hendricks Limes LPN 16/03/9603 54:09:81  _____________________________________________________________________  External Attachment:    Type:   Image     Comment:   External Document  Appended Document: PA for aciphex Do I need to do another RX for aciphex?  Appended Document: PA for aciphex no, pt has refills untill 10/2010

## 2010-07-24 NOTE — Letter (Signed)
Summary: External Other  External Other   Imported By: Peggyann Shoals 10/18/2009 11:56:21  _____________________________________________________________________  External Attachment:    Type:   Image     Comment:   External Document

## 2010-07-24 NOTE — Progress Notes (Signed)
Summary: Initial evaluation  Initial evaluation   Imported By: Jacklynn Ganong 04/30/2010 09:48:15  _____________________________________________________________________  External Attachment:    Type:   Image     Comment:   External Document

## 2010-07-24 NOTE — Progress Notes (Signed)
Summary: aciphex rx  Phone Note Call from Patient Call back at Home Phone [phone]   Caller: Patient Summary of Call: pt came by office- she stated Aciphex was working great and would like an Rx sent to [facility].  Initial call taken by: Hendricks Limes LPN,  Nov 14, 2009 2:31 PM     Appended Document: aciphex rx    Prescriptions: ACIPHEX 20 MG TBEC (RABEPRAZOLE SODIUM) one by mouth before breakfast daily  #30 x 11   Entered and Authorized by:   Leanna Battles. Dixon Boos   Signed by:   Leanna Battles Dixon Boos on 11/15/2009   Method used:   Electronically to        [facility] (retail)       [address]       [place], [place]  [address]       Ph: [phone] or [phone]       Fax: [phone]   RxID:   [id number]

## 2010-07-24 NOTE — Letter (Signed)
Summary: progress notes  progress notes   Imported By: Faythe Ghee 08/28/2009 12:09:14  _____________________________________________________________________  External Attachment:    Type:   Image     Comment:   External Document

## 2010-07-24 NOTE — Letter (Signed)
Summary: CONFIRMATION FOR PROCEDURE/[facility]  CONFIRMATION FOR PROCEDURE/[facility]   Imported By: Diana Eves 08/04/2009 12:26:53  _____________________________________________________________________  External Attachment:    Type:   Image     Comment:   External Document

## 2010-07-24 NOTE — Letter (Signed)
Summary: Recall Office Visit  [facility]  [address]   [place], [place] [address]   Phone: [phone]  Fax: [phone]      February 27, 2010   [name] [address] [place], [place]  [address] 01-16-1946   Dear Ms. Walthour,   According to our records, it is time for you to schedule a follow-up office visit with [place].   At your convenience, please call [phone] to schedule an office visit. If you have any questions, concerns, or feel that this letter is in error, we would appreciate your call.   Sincerely,    Diana Eves  [facility] Ph: [phone]   Fax: [phone]

## 2010-07-24 NOTE — Letter (Signed)
Summary: [facility]/PATH/[facility]  [facility]   Imported By: Diana Eves 08/31/2009 13:55:33  _____________________________________________________________________  External Attachment:    Type:   Image     Comment:   External Document

## 2010-07-24 NOTE — Assessment & Plan Note (Signed)
Summary: EVAL/TREAT RT LEG PAIN/NEEDS XRAYS/SEC HORIZON/CAF   Visit Type:  new patient Referring Provider:  Dr. Felecia Shelling Primary Provider:  Felecia Shelling  CC:  bilateral leg pain.  History of Present Illness: I saw [name] in the office today for an initial visit.  She is a 65 years old woman with the complaint of:  bilateral leg pain.  Xrays today.  This is a 65 year old female comes to [place] at the request of Dr. Arlester Marker to complaining of pain in her RIGHT knee that radiates up into her RIGHT hip, and LEFT ankle pain, crepitation, and pain, LEFT knee.  In 2002. She had a RIGHT total knee arthroplasty.  In 2006. MRI was obtained of her lumbar spine show 5 level degenerative disc disease.  Her pain is described as sharp throbbing, stabbing, and intermittent unrelieved by hydrocodone.  Pain level is 9/10.  There is some catching and locking related to the knee pain.    Current Medications (verified): 1)  Zolpidem Tartrate 10 Mg Tabs (Zolpidem Tartrate) .... Take 1 Tab At Bedtime 2)  Amlodipine Besylate 5 Mg Tabs (Amlodipine Besylate) .... Take 1 Tab Daily 3)  Cymbalta 60 Mg Cpep (Duloxetine Hcl) .... Take 1 Cap Daily 4)  Lovastatin 20 Mg Tabs (Lovastatin) .... Take 1 Tab Daily 5)  Neurontin 300 Mg Caps (Gabapentin) .... Take 1 Cap Am 4 Caps Pm 6)  Benazepril-Hydrochlorothiazide 20-12.5 Mg Tabs (Benazepril-Hydrochlorothiazide) .... Take 1 Tab Two Times A Day 7)  Atenolol 50 Mg Tabs (Atenolol) .... Take 2 Tablets in Am and 1 Tablet in The Pm 8)  Hydrocodone-Acetaminophen 5-500 Mg Tabs (Hydrocodone-Acetaminophen) .... Take As Needed For Pain 9)  Aciphex 20 Mg Tbec (Rabeprazole Sodium) .... One By Mouth Before Breakfast Daily 10)  Zyrtec Hives Relief 10 Mg Tabs (Cetirizine Hcl) .... Take 1 Tablet By Mouth Once A Day 11)  Miralax  Powd (Polyethylene Glycol 3350) .... One Dose Daily 12)  Benefiber .... Twice Daily 13)  Antibiotic (For Uti ) .... Take 1 Tablet By Mouth Two Times A Day  For 5 Days  Allergies (verified): 1)  ! Penicillin 2)  ! Codeine 3)  ! Sulfa  Past History:  Past Medical History: Chest pain Hypertension Hyperlipidemia THYROID NODULE (ICD-241.0) COLONIC POLYPS, ADENOMATOUS (ICD-211.3)-excised during colonoscopy in 2006 & 2010 GERD (ICD-530.81); diverticular disease BACK PAIN, CHRONIC (ICD-724.5) ANXIETY DEPRESSION (ICD-300.4) OSTEOARTHRITIS (ICD-715.90) FIBROMYALGIA (ICD-729.1) Migraines Reflux Insomnia Chronic constipation High Cholesterol  Past Surgical History: Her right total knee arthroplasty in 2002 Her right knee cartilage repair-1999 Right shoulder surgery for bone spurs Excisional biopsy for benign disease of the left breast Tonsillectomy Hysterectomy Right oophorectomy due to cyst Lung biopsy-negative Ingrown toenails removed  Family History: Father:deceased age 33 due to stroke Mother:deceased age 65 myocardial infarction                               cancer  lung dx  Social History: Reviewed history from 08/16/2009 and no changes required. Retired  Tobacco Use - No.  Alcohol Use - no Regular Exercise - no Drug Use - no  11th grade  Review of Systems Constitutional:  Complains of weight gain; denies fever, chills, and fatigue. Respiratory:  Complains of couch; denies short of breath, wheezing, tightness, pain on inspiration, and snoring . Gastrointestinal:  Complains of heartburn and constipation; denies nausea, vomiting, diarrhea, and blood in your stools. Musculoskeletal:  Complains of joint pain and stiffness; denies swelling, instability,  redness, heat, and muscle pain. Endocrine:  Complains of heat or cold intolerance; denies excessive thirst and exessive urination. Psychiatric:  Complains of nervousness and depression; denies anxiety and hallucinations. Skin:  Complains of rash and itching; denies changes in the skin, poor healing, and redness. Immunology:  Complains of seasonal allergies; denies sinus  problems and allergic to bee stings. Hemoatologic:  Complains of easy bleeding; denies brusing.  The review of systems is negative for Cardiovascular, Genitourinary, Neurologic, and HEENT.  Physical Exam  Msk:  The patient is well developed and nourished, with normal grooming and hygiene. The body habitus is  large  Weight 202 pounds, height 5 feet 2 inches.  Respiratory rate 18 Pulses:  pulses normal in all 4 extremities Extremities:  RIGHT lower extremity.  LEFT lower extremity, ankle:  Tenderness in the posterior portion of the ankle and Achilles tendon with swelling in the retrocalcaneal bursa. Pain on passive stretch and dorsiflexion, which measures 15. The ankle appears a little lax with a grade 1 anterior drawer test with a firm endpoint. There is no pain with inversion, eversion, and that seems normal. Strength assessment is normal as well. She does have some mild pes planus.  LEFT knee flexion is 120, extension is full. There is tenderness along the lateral joint line. The ligaments appear to be stable. Muscle tone is normal. Strength is normal.  She is a RIGHT total knee replacement. Incision   Adequate flexion is noted. The knee is stable. Muscle tone and strength are normal. Hip rotation is normal.   Neurologic:  The coordination and sensation were normal  The reflexes were normal     Impression & Recommendations:  Problem # 1:  OSTEOARTHRITIS, KNEE, LEFT, MILD (ICD-715.96) Assessment New  radiographs were obtained of the pelvis to evaluate the RIGHT hip to rule out hip disease as the cause of her symptoms.  We also took an x-ray of the LEFT knee to evaluate for osteoarthritis.  AP pelvis. Findings.normal contours of the RIGHT and LEFT hip joints with no pelvic bony abnormality. Leg lengths are equal on x-ray Impression normal x-ray of the pelvis and  3 views LEFT knee.  Findings.mild to moderate medial joint space narrowing with peaking of the tibial  spines. Increased sclerosis of the medial tibial plateau. The patellofemoral joint is well centered.  Impression osteoarthritis LEFT knee  Her updated medication list for this problem includes:    Hydrocodone-acetaminophen 5-500 Mg Tabs (Hydrocodone-acetaminophen) .Marland Kitchen... Take as needed for pain  Problem # 2:  DEGENERATIVE DISC DISEASE, LUMBOSACRAL SPINE (ICD-722.52) Assessment: New  Other Orders: New Patient Level III ([address]) Pelvis x-ray, 1/2 views (60454) Knee x-ray,  3 views ([address]) Joint Aspirate / Injection, Large ([address]) Depo- Medrol 40mg  (J1030)  Patient Instructions: 1)  DIAGNOSIS  2)  RIGHT LEG PAIN CAUSED BY DISC DISEASE OF THE LUMBAR SPINE start PT for the back/leg 3)  LEFT KNEE PAIN CAUSED BY OSTEOARTHRITIS OF THE LEFT KNEE [injected] You have received an injection of cortisone today. You may experience increased pain at the injection site.  4)  Apply ice pack to the area for 20 minutes every 2 hours and take 2 xtra strength tylenol every 8 hours. This increased pain will usually resolve in 24 hours. The injection will take effect in 3-10 days.  5)  ACHILLES TENDONITIS LEFT ANKLE [apply aspercremme three times a day] wear elevated heel shoe  6)  return as needed    Orders Added: 1)  New Patient Level III [91478] 2)  Pelvis x-ray, 1/2 views [72170] 3)  Knee x-ray,  3 views [73562] 4)  Joint Aspirate / Injection, Large [[address]] 5)  Depo- Medrol 40mg  [J1030]

## 2010-07-24 NOTE — Letter (Signed)
Summary: External Other  External Other   Imported By: Peggyann Shoals 11/15/2009 09:46:05  _____________________________________________________________________  External Attachment:    Type:   Image     Comment:   External Document  Appended Document: External Other need path report from [facility]  Appended Document: External Other requested

## 2010-07-24 NOTE — Assessment & Plan Note (Signed)
Summary: fu from procedure/ss   Visit Type:  Follow-up Visit Primary Care Provider:  fanta  Chief Complaint:  follow up from procedure- still having some problems.  History of Present Illness: 65 year old lady returns for followup. She apparently had a granular cell tumor in her esophagus removed via EMR on April 12. I do not have those records for review. She is doing well she does have some vague odynophagia.  She continues to have GERD symptoms. She's failed Prilosec/Nexium previously. No dysphagia. Occasional upper abdominal pain irregular bowel movements on a fiber supplement. She takes MiraLax sporadically and sometimes overshoots her endpoint having some diarrhea. History of colonic adenomas removed; she is due for surveillance 2015. Dr. Margaretha Glassing states she needs to return for repeat endoscopy over there in one year. I certainly agree with that approach.  Current Problems (verified): 1)  Palpitations  (ICD-785.1) 2)  Chest Pain  (ICD-786.50) 3)  Hyperlipidemia  (ICD-272.4) 4)  Hypertension  (ICD-401.9) 5)  Thyroid Nodule  (ICD-241.0) 6)  Colonic Polyps, Adenomatous  (ICD-211.3) 7)  Gerd  (ICD-530.81) 8)  Back Pain, Chronic  (ICD-724.5) 9)  Anxiety Depression  (ICD-300.4) 10)  Osteoarthritis  (ICD-715.90) 11)  Fibromyalgia  (ICD-729.1)  Current Medications (verified): 1)  Zolpidem Tartrate 10 Mg Tabs (Zolpidem Tartrate) .... Take 1 Tab At Bedtime 2)  Amlodipine Besylate 5 Mg Tabs (Amlodipine Besylate) .... Take 1 Tab Daily 3)  Cymbalta 60 Mg Cpep (Duloxetine Hcl) .... Take 1 Cap Daily 4)  Lovastatin 20 Mg Tabs (Lovastatin) .... Take 1 Tab Daily 5)  Neurontin 300 Mg Caps (Gabapentin) .... Take 1 Cap Am 4 Caps Pm 6)  Benazepril-Hydrochlorothiazide 20-12.5 Mg Tabs (Benazepril-Hydrochlorothiazide) .... Take 1 Tab Two Times A Day 7)  Dexilant 60 Mg Cpdr (Dexlansoprazole) .... Take 1 Cap Daily 8)  Atenolol 50 Mg Tabs (Atenolol) .... Take 2 Tablets in Am and 1 Tablet in The Pm 9)   Hydrocodone-Acetaminophen 5-500 Mg Tabs (Hydrocodone-Acetaminophen) .... Take As Needed For Pain  Allergies (verified): 1)  ! Penicillin 2)  ! Codeine 3)  ! Sulfa  Past History:  Past Medical History: Last updated: 08/22/2009 Chest pain Hypertension Hyperlipidemia THYROID NODULE (ICD-241.0) COLONIC POLYPS, ADENOMATOUS (ICD-211.3)-excised during colonoscopy in 2006 & 2010 GERD (ICD-530.81); diverticular disease BACK PAIN, CHRONIC (ICD-724.5) ANXIETY DEPRESSION (ICD-300.4) OSTEOARTHRITIS (ICD-715.90) FIBROMYALGIA (ICD-729.1)  Past Surgical History: Last updated: 08/22/2009 Her right total knee arthroplasty in 2002 Her right knee cartilage repair-1999 Right shoulder surgery for bone spurs Excisional biopsy for benign disease of the left breast Tonsillectomy Hysterectomy Right oophorectomy due to cyst Lung biopsy-negative  Family History: Last updated: 2009/08/31 Father:deceased age 49 due to stroke Mother:deceased age 32 myocardial infarction  Social History: Last updated: 08/31/09 Retired  Tobacco Use - No.  Alcohol Use - no Regular Exercise - no Drug Use - no  Risk Factors: Exercise: no (31-Aug-2009)  Risk Factors: Smoking Status: never (08-31-2009)  Vital Signs:  Patient profile:   65 year old female Height:      63 inches Weight:      201 pounds BMI:     35.73 Temp:     98.4 degrees F oral Pulse rate:   68 / minute BP sitting:   128 / 82  (left arm) Cuff size:   large  Vitals Entered By: Hendricks Limes LPN (Oct 31, 2009 8:26 AM)  Physical Exam  General:  very pleasant lady alert conversant no acute distress Eyes:  no scleral icterus Lungs:  clear to auscultation Heart:  regular rate rhythm  without murmur gallop rub Abdomen:  nondistended obese positive bowel sounds soft, nontender without appreciable mass or organomegaly  Impression & Recommendations: Impression: Ongoing symptoms of GERD. Has failed multiple PPIs. I'm concerned about the  possibility of PPI failure; more likely nonerosive reflux disease/non-acidic  reflux. Granular cell tumor removed from her esophagus recently. Likely having some discomfort related to recent resection. Irregular bowel movements ;history diverticulosis and colonic polyps.  Recommendations: Three-week course of AcipHex 20 mg orally daily. Samples provided. Stop Dexalant.  Carafate suspension 1 g q.i.d. x1 will  Begin Benefiber 1 tablespoon daily  MiraLax 17 g orally every other day p.r.n. no bowel movement in 2 days.  Return visit here in 3 weeks.  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 Patient Level IV ([address]) - Signed      Appended Document: fu from procedure/ss reminder in the computer for 3 month fu.

## 2010-07-24 NOTE — Medication Information (Signed)
Summary: RX Folder  RX Folder   Imported By: Peggyann Shoals 11/15/2009 13:39:04  _____________________________________________________________________  External Attachment:    Type:   Image     Comment:   External Document  Appended Document: RX Folder working on [employer]

## 2010-07-24 NOTE — Miscellaneous (Signed)
Summary: PT order  PT order   Imported By: Cammie Sickle 05/08/2010 19:16:44  _____________________________________________________________________  External Attachment:    Type:   Image     Comment:   External Document

## 2010-07-24 NOTE — Assessment & Plan Note (Signed)
Summary: past due for f/u per pt request/tg   Visit Type:  Follow-up Primary Provider:  Phebe Colla   History of Present Illness: [name] is seen at her request for palpitations.  She neglected to appear for her most recent appointment, which was scheduled for mid 2010.  Overall, she has been fairly stable.  She exercises little due to chronic back pain.  She has been evaluated by Dr. Romeo Apple, who recommended surgical intervention; however, she has declined.  She reports a sense of tachypalpitation after she eats certain foods, with anxiety and with modest exertion.  Symptoms last for minutes and resolve spontaneously.  There is no associated dyspnea, diaphoresis, nausea nor chest discomfort.  She does not have a device that is able to measure blood pressure and heart rate at home.  EKG  Procedure date:  08/22/2009  Findings:      Rhythm Strip  Normal sinus rhythm at a rate of 63 bpm   Current Medications (verified): 1)  Zolpidem Tartrate 10 Mg Tabs (Zolpidem Tartrate) .... Take 1 Tab At Bedtime 2)  Amlodipine Besylate 5 Mg Tabs (Amlodipine Besylate) .... Take 1 Tab Daily 3)  Cymbalta 60 Mg Cpep (Duloxetine Hcl) .... Take 1 Cap Daily 4)  Lovastatin 20 Mg Tabs (Lovastatin) .... Take 1 Tab Daily 5)  Neurontin 300 Mg Caps (Gabapentin) .... Take 1 Cap Am 4 Caps Pm 6)  Benazepril-Hydrochlorothiazide 20-12.5 Mg Tabs (Benazepril-Hydrochlorothiazide) .... Take 1 Tab Two Times A Day 7)  Dexilant 60 Mg Cpdr (Dexlansoprazole) .... Take 1 Cap Daily 8)  Atenolol 50 Mg Tabs (Atenolol) .... Take 2 Tablets in Am and 1 Tablet in The Pm 9)  Hydrocodone-Acetaminophen 5-500 Mg Tabs (Hydrocodone-Acetaminophen) .... Take As Needed For Pain  Allergies (verified): No Known Drug Allergies  Past History:  PMH, FH, and Social History reviewed and updated.  Past Medical History: Chest pain Hypertension Hyperlipidemia THYROID NODULE (ICD-241.0) COLONIC POLYPS, ADENOMATOUS (ICD-211.3;  excised during colonoscopy in 2006 and 2010 Gastroesophageal reflux disease; diverticular disease BACK PAIN, CHRONIC (ICD-724.5) ANXIETY DEPRESSION (ICD-300.4) OSTEOARTHRITIS (ICD-715.90) FIBROMYALGIA (ICD-729.1)  Past Surgical History: Right knee cartlidge repair in 1990 Right total knee arthroplasty in 2002 Right shoulder surgery for bone spurs Excisional left breast biopsy for benign disease Tonsillectomy Hysterectomy; right oophorectomy for a cyst Lung biopsy-negative  Review of Systems       The patient complains of peripheral edema.  The patient denies weight loss, weight gain, vision loss, decreased hearing, hoarseness, chest pain, syncope, dyspnea on exertion, prolonged cough, headaches, hemoptysis, abdominal pain, melena, and hematochezia.    Vital Signs:  Patient profile:   65 year old female Height:      63 inches Weight:      189 pounds BMI:     33.60 Pulse rate:   54 / minute BP sitting:   136 / 88  (right arm)  Vitals Entered By: Dreama Saa, CNA (August 22, 2009 12:49 PM)  Physical Exam  General:  Obese; well developed; no acute distress:   Neck-No JVD; no carotid bruits: Lungs-No tachypnea, no rales; no rhonchi; no wheezes: Cardiovascular-normal PMI; normal S1 and S2; S4 present Abdomen-BS normal; soft and non-tender without masses or organomegaly:  Musculoskeletal-No deformities, no cyanosis or clubbing: Neurologic-Normal cranial nerves; symmetric strength and tone:  Skin-Warm, no significant lesions: Extremities-Nl distal pulses; no edema:     Impression & Recommendations:  Problem # 1:  CHEST PAIN (ICD-786.50) No recurrence in recent months.  Specific etiology was not determined; fibromyalgia is  a likely possibility.  No further evaluation warranted at the present time.  Problem # 2:  HYPERLIPIDEMIA (ICD-272.4) Recent laboratory including a lipid profile will be obtained from the patient's primary care physician.  Problem # 3:  HYPERTENSION  (ICD-401.9) Blood pressure control is reasonable based upon today's measurement.  Problem # 4:  PALPITATIONS (ICD-785.1) Rhythm disturbance does not sound to be of great concern.  Patient carried an event recorder last year without any specific arrhythmia identified.  She is neither willing to wear a 21 day recorder or even a 24 hour device.  We will empirically increase her dose of atenolol and plan to see her again in one month for reassessment of symptoms.  Patient Instructions: 1)  Your physician recommends that you schedule a follow-up appointment in: 1 month 2)  Your physician has recommended you make the following change in your medication:  increase atenolol to 100mg  in am and 50mg  in pm Prescriptions: ATENOLOL 50 MG TABS (ATENOLOL) take 2 tablets in am and 1 tablet in the pm  #90 x 3   Entered by:   Teressa Lower RN   Authorized by:   Kathlen Brunswick, MD, [facility]   Signed by:   Teressa Lower RN on 08/22/2009   Method used:   Electronically to        [facility] (retail)       [address]       [place], [place]  [address]       Ph: [phone] or [phone]       Fax: [phone]   RxID:   [id number]

## 2010-07-24 NOTE — Letter (Signed)
Summary: [facility]-EGD REPORT  [facility]-E[facility] REPORT   Imported By: Ave Filter 11/10/2009 10:14:59  _____________________________________________________________________  External Attachment:    Type:   Image     Comment:   External Document

## 2010-07-26 NOTE — Letter (Signed)
Summary: Recall Office Visit  [facility]  [address]   [place], [place] [address]   Phone: [phone]  Fax: [phone]      July 10, 2010   [name] [address] [place], [place]  [address] 26-Jun-1945   Dear [name],   According to our records, it is time for you to schedule a follow-up office visit with [place].   At your convenience, please call [phone] to schedule an office visit. If you have any questions, concerns, or feel that this letter is in error, we would appreciate your call.   Sincerely,    Diana Eves  [facility] Ph: [phone]   Fax: [phone]

## 2010-07-26 NOTE — Miscellaneous (Signed)
Summary: PT progress note  PT progress note   Imported By: Jacklynn Ganong 06/19/2010 10:14:34  _____________________________________________________________________  External Attachment:    Type:   Image     Comment:   External Document

## 2010-07-30 ENCOUNTER — Encounter: Payer: Self-pay | Admitting: Internal Medicine

## 2010-08-08 ENCOUNTER — Encounter: Payer: Self-pay | Admitting: Orthopedic Surgery

## 2010-08-09 NOTE — Letter (Signed)
Summary: [facility] NOTE  [facility] NOTE   Imported By: Rexene Alberts 07/30/2010 08:43:21  _____________________________________________________________________  External Attachment:    Type:   Image     Comment:   External Document

## 2010-08-10 ENCOUNTER — Encounter (INDEPENDENT_AMBULATORY_CARE_PROVIDER_SITE_OTHER): Payer: Medicare Other | Admitting: Psychiatry

## 2010-08-10 DIAGNOSIS — F39 Unspecified mood [affective] disorder: Secondary | ICD-10-CM

## 2010-08-17 ENCOUNTER — Other Ambulatory Visit (HOSPITAL_COMMUNITY): Payer: Self-pay | Admitting: Internal Medicine

## 2010-08-17 DIAGNOSIS — N6459 Other signs and symptoms in breast: Secondary | ICD-10-CM

## 2010-08-20 ENCOUNTER — Other Ambulatory Visit: Payer: Self-pay | Admitting: Internal Medicine

## 2010-08-20 ENCOUNTER — Other Ambulatory Visit (HOSPITAL_COMMUNITY): Payer: Self-pay | Admitting: Internal Medicine

## 2010-08-20 DIAGNOSIS — N6452 Nipple discharge: Secondary | ICD-10-CM

## 2010-08-21 NOTE — Miscellaneous (Signed)
Summary: No Show for Physical Therapy Report  No Show for Physical Therapy Report   Imported By: Cammie Sickle 08/17/2010 13:57:09  _____________________________________________________________________  External Attachment:    Type:   Image     Comment:   External Document

## 2010-08-23 ENCOUNTER — Ambulatory Visit
Admission: RE | Admit: 2010-08-23 | Discharge: 2010-08-23 | Disposition: A | Discharge status: Home / Self Care | Payer: Medicare Other | Source: Ambulatory Visit | Attending: Internal Medicine | Admitting: Internal Medicine

## 2010-08-23 ENCOUNTER — Other Ambulatory Visit: Payer: Self-pay | Admitting: Diagnostic Radiology

## 2010-08-23 ENCOUNTER — Other Ambulatory Visit: Payer: Self-pay | Admitting: Internal Medicine

## 2010-08-23 DIAGNOSIS — N6452 Nipple discharge: Secondary | ICD-10-CM

## 2010-08-23 DIAGNOSIS — N6320 Unspecified lump in the left breast, unspecified quadrant: Secondary | ICD-10-CM

## 2010-08-23 HISTORY — PX: EUS: SHX5427

## 2010-08-29 ENCOUNTER — Ambulatory Visit (HOSPITAL_COMMUNITY): Payer: Medicare Other

## 2010-09-03 ENCOUNTER — Encounter: Payer: Self-pay | Admitting: Internal Medicine

## 2010-09-07 ENCOUNTER — Encounter (INDEPENDENT_AMBULATORY_CARE_PROVIDER_SITE_OTHER): Payer: Medicare Other | Admitting: Psychiatry

## 2010-09-07 DIAGNOSIS — F39 Unspecified mood [affective] disorder: Secondary | ICD-10-CM

## 2010-09-11 ENCOUNTER — Other Ambulatory Visit (HOSPITAL_COMMUNITY): Payer: Self-pay | Admitting: General Surgery

## 2010-09-11 DIAGNOSIS — D493 Neoplasm of unspecified behavior of breast: Secondary | ICD-10-CM

## 2010-09-11 NOTE — Letter (Signed)
Summary: [facility] GI CLINIC NOTE  [facility] GI CLINIC NOTE   Imported By: Rexene Alberts 09/03/2010 14:17:49  _____________________________________________________________________  External Attachment:    Type:   Image     Comment:   External Document  Appended Document: [facility] GI CLINIC NOTE needs EGD in 2 years per [facility] recommendations  Appended Document: [facility] GI CLINIC NOTE reminder in epic

## 2010-09-17 ENCOUNTER — Other Ambulatory Visit: Payer: Self-pay | Admitting: General Surgery

## 2010-09-17 ENCOUNTER — Encounter (HOSPITAL_COMMUNITY): Payer: Medicare Other

## 2010-09-17 DIAGNOSIS — Z01810 Encounter for preprocedural cardiovascular examination: Secondary | ICD-10-CM | POA: Insufficient documentation

## 2010-09-17 DIAGNOSIS — Z01812 Encounter for preprocedural laboratory examination: Secondary | ICD-10-CM | POA: Insufficient documentation

## 2010-09-17 LAB — BASIC METABOLIC PANEL
BUN: 10 mg/dL (ref 6–23)
CO2: 28 mEq/L (ref 19–32)
Calcium: 10.1 mg/dL (ref 8.4–10.5)
Chloride: 100 mEq/L (ref 96–112)
Creatinine, Ser: 0.8 mg/dL (ref 0.4–1.2)
GFR calc Af Amer: >60 mL/min (ref >60)
GFR calc non Af Amer: >60 mL/min (ref >60)
Glucose, Bld: 94 mg/dL (ref 70–99)
Potassium: 3.4 mEq/L — ABNORMAL LOW (ref 3.5–5.1)
Sodium: 138 mEq/L (ref 135–145)

## 2010-09-17 LAB — CBC
HCT: 42.2 % (ref 36.0–46.0)
Hemoglobin: 13.8 g/dL (ref 12.0–15.0)
MCH: 27 pg (ref 26.0–34.0)
MCHC: 32.7 g/dL (ref 30.0–36.0)
MCV: 82.6 fL (ref 78.0–100.0)
Platelets: 186 10*3/uL (ref 150–400)
RBC: 5.11 MIL/uL (ref 3.87–5.11)
RDW: 14.7 % (ref 11.5–15.5)
WBC: 4.1 10*3/uL (ref 4.0–10.5)

## 2010-09-17 LAB — SURGICAL PCR SCREEN
MRSA, PCR: NEGATIVE
Staphylococcus aureus: NEGATIVE

## 2010-09-18 ENCOUNTER — Other Ambulatory Visit: Payer: Self-pay | Admitting: General Surgery

## 2010-09-19 ENCOUNTER — Ambulatory Visit (HOSPITAL_COMMUNITY)
Admission: RE | Admit: 2010-09-19 | Discharge: 2010-09-19 | Disposition: A | Discharge status: Home / Self Care | Payer: Medicare Other | Source: Ambulatory Visit | Attending: General Surgery | Admitting: General Surgery

## 2010-09-19 ENCOUNTER — Ambulatory Visit (HOSPITAL_COMMUNITY): Payer: Medicare Other

## 2010-09-19 DIAGNOSIS — D249 Benign neoplasm of unspecified breast: Secondary | ICD-10-CM | POA: Insufficient documentation

## 2010-09-19 DIAGNOSIS — D499 Neoplasm of unspecified behavior of unspecified site: Secondary | ICD-10-CM

## 2010-09-19 DIAGNOSIS — D493 Neoplasm of unspecified behavior of breast: Secondary | ICD-10-CM

## 2010-09-19 DIAGNOSIS — Z01812 Encounter for preprocedural laboratory examination: Secondary | ICD-10-CM | POA: Insufficient documentation

## 2010-09-19 DIAGNOSIS — Z79899 Other long term (current) drug therapy: Secondary | ICD-10-CM | POA: Insufficient documentation

## 2010-09-19 DIAGNOSIS — Z01810 Encounter for preprocedural cardiovascular examination: Secondary | ICD-10-CM | POA: Insufficient documentation

## 2010-09-19 DIAGNOSIS — I10 Essential (primary) hypertension: Secondary | ICD-10-CM | POA: Insufficient documentation

## 2010-09-20 NOTE — Op Note (Signed)
  NAMEAERITH, [name]               ACCOUNT NO.:  [id number]  MEDICAL RECORD NO.:  [id number]           PATIENT TYPE:  O  LOCATION:  DAYP                          FACILITY:  [facility]  PHYSICIAN:  Dalia Heading, M.D.  DATE OF BIRTH:  May 01, 1946  DATE OF PROCEDURE:  09/19/2010 DATE OF DISCHARGE:                              OPERATIVE REPORT   PREOPERATIVE DIAGNOSIS:  Left breast neoplasm.  POSTOPERATIVE DIAGNOSIS:  Left breast neoplasm.  PROCEDURE:  Left breast biopsy after needle localization.  SURGEON:  Dalia Heading, MD  ANESTHESIA:  General.  INDICATIONS:  The patient is a 65 year old black female who underwent a needle core biopsy and was found to have a papilloma with sclerosis. The patient now comes to the operating room for a completion biopsy. Risks and benefits of the procedure, including bleeding and infection, were fully explained to the patient, gave informed consent.  PROCEDURE NOTE:  The patient was placed in supine position.  She had undergone a left breast needle localization in the x-ray department. The left breast was prepped and draped in the usual sterile technique with DuraPrep once the patient underwent general anesthesia.  Surgical site confirmation was performed.  A curvilinear incision was made along the medial aspect of the areola. Dissection was taken down to the area of concern on mammography.  This was excised without difficulty.  It was sent to the x-ray department. Specimen radiography revealed the suspicious area and clip to be in the specimen that was removed.  The specimen was then sent to pathology for further examination.  Any bleeding was controlled using Bovie electrocautery.  A 0.5% Sensorcaine was instilled into the surrounding wound.  The skin was closed using a 4-0 Vicryl subcuticular suture. Dermabond was then applied.  All tape and needle counts were correct at the end of the procedure. The patient was awakened and transferred to  PACU in stable condition. Complications none.  SPECIMEN:  Left breast biopsy.  BLOOD LOSS:  Minimal.     Dalia Heading, M.D.     MAJ/MEDQ  D:  09/19/2010  T:  09/20/2010  Job:  [id number]  cc:   Tesfaye D. Felecia Shelling, MD Fax: [phone]  Electronically Signed by Franky Macho M.D. on 09/20/2010 11:49:58 AM

## 2010-09-20 NOTE — H&P (Signed)
  NAMEGRAYLEE, [name]               ACCOUNT NO.:  [id number]  MEDICAL RECORD NO.:  [id number]           PATIENT TYPE:  LOCATION:                                 FACILITY:  PHYSICIAN:  Dalia Heading, M.D.  DATE OF BIRTH:  04-Mar-1946  DATE OF ADMISSION: DATE OF DISCHARGE:  LH                             HISTORY & PHYSICAL   CHIEF COMPLAINT:  Left breast neoplasm.  HISTORY OF PRESENT ILLNESS:  The patient is a 65 year old black female who is referred for evaluation and treatment of a left breast papilloma. This was found on routine mammography.  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  She has had a left breast biopsy in the remote past.  PAST MEDICAL HISTORY:  High cholesterol levels, hypertension.  PAST SURGICAL HISTORY:  Knee replacement, hysterectomy, oophorectomy, left breast biopsy, lung biopsy.  CURRENT MEDICATIONS:  Lovastatin, Cymbalta, Zyrtec, atenolol, Ambien, Neurontin, Lotensin, AcipHex.  ALLERGIES:  CODEINE, SULFUR, PENICILLIN.  REVIEW OF SYSTEMS:  The patient denies tobacco or alcohol use.  She denies any other cardiopulmonary difficulties or bleeding disorders.  FAMILY MEDICAL HISTORY:  Positive for breast cancer in her mother.  PHYSICAL EXAMINATION:  The patient is a well-developed, well-nourished black female in no acute distress.  HEENT examination is unremarkable. Neck is supple without lymphadenopathy.  Lungs are clear to auscultation with equal breath sounds bilaterally.  Heart examination reveals regular rate and rhythm without S3, S4, or murmurs.  The abdomen is unremarkable.  Right breast examination reveals no dominant mass, nipple discharge, or dimpling.  The axilla is negative for palpable nodes. Left breast examination reveals no dominant mass, nipple discharge, or dimpling.  The axilla is negative for palpable nodes.  IMPRESSION:  Papilloma, left breast.  PLAN:  The patient is scheduled for left breast biopsy after  needle localization on September 19, 2010.  The risks and benefits of the procedure including bleeding and infection were fully explained to the patient, gave informed consent.     Dalia Heading, M.D.     MAJ/MEDQ  D:  09/11/2010  T:  09/11/2010  Job:  [id number]  cc:   Chase Picket at [facility].  Tesfaye D. Felecia Shelling, MD Fax: [phone]  Electronically Signed by Franky Macho M.D. on 09/20/2010 11:49:56 AM

## 2010-09-25 ENCOUNTER — Encounter (INDEPENDENT_AMBULATORY_CARE_PROVIDER_SITE_OTHER): Payer: Medicare Other | Admitting: Psychiatry

## 2010-09-25 DIAGNOSIS — F3189 Other bipolar disorder: Secondary | ICD-10-CM

## 2010-09-28 ENCOUNTER — Encounter (HOSPITAL_COMMUNITY): Payer: Medicare Other | Admitting: Psychiatry

## 2010-10-01 ENCOUNTER — Encounter (HOSPITAL_COMMUNITY): Payer: Medicare Other | Admitting: Psychiatry

## 2010-10-02 ENCOUNTER — Other Ambulatory Visit (HOSPITAL_COMMUNITY): Payer: Self-pay | Admitting: Internal Medicine

## 2010-10-02 DIAGNOSIS — Z139 Encounter for screening, unspecified: Secondary | ICD-10-CM

## 2010-10-04 ENCOUNTER — Encounter: Payer: Self-pay | Admitting: Gastroenterology

## 2010-10-04 ENCOUNTER — Ambulatory Visit (INDEPENDENT_AMBULATORY_CARE_PROVIDER_SITE_OTHER): Payer: Medicare Other | Admitting: Gastroenterology

## 2010-10-04 VITALS — BP 140/79 | HR 70 | Temp 98.0°F | Ht 62.0 in | Wt 202.6 lb

## 2010-10-04 DIAGNOSIS — K649 Unspecified hemorrhoids: Secondary | ICD-10-CM

## 2010-10-04 DIAGNOSIS — D126 Benign neoplasm of colon, unspecified: Secondary | ICD-10-CM

## 2010-10-04 DIAGNOSIS — K219 Gastro-esophageal reflux disease without esophagitis: Secondary | ICD-10-CM

## 2010-10-04 DIAGNOSIS — K5900 Constipation, unspecified: Secondary | ICD-10-CM

## 2010-10-04 DIAGNOSIS — R110 Nausea: Secondary | ICD-10-CM

## 2010-10-04 DIAGNOSIS — K229 Disease of esophagus, unspecified: Secondary | ICD-10-CM

## 2010-10-04 MED ORDER — LIDOCAINE-HYDROCORTISONE ACE 3-0.5 % RE KIT: PACK | RECTAL | Status: DC

## 2010-10-04 NOTE — Assessment & Plan Note (Signed)
Increase water consumption. Increase dietary fiber. Take MiraLax daily. 17 g daily causes diarrhea per her therefore recommend she try a half cap full daily.

## 2010-10-04 NOTE — Progress Notes (Signed)
Primary Care Physician: Avon Gully, MD  Primary Gastroenterologist: Dr. Roetta Sessions  Chief Complaint  Patient presents with  . Follow-up    EGD    HPI: [name] is a 65 y.o. female here for followup visit here she recently underwent EGD and EUS by Dr. Harlen Labs at [facility]. No evidence of recurrent granular cell esophageal tumor. They recommended she have a followup EGD by Dr. Jena Gauss in one to 2 years. At time a recent EGD she was found to have a substantial amount of food present in her stomach even know she had fasted. She C/O epigastric fullness, feels like food backing up. PP abdominal swelling. Aciphex doesn't seem to help. Heartburn so bad she hates to eat. Burns all the way into throat. Some vague intermittent dysphagia. None of the other PPIs or Zantac help (tried omeprazole, Nexium, Dexilant, Protonix, Zegerid). BM still constipation. Does not take MiraLax on a regular basis. Hemorrhoids bad. Proctofoam cost $75. Wants something cheaper and non-greasy.   Current Outpatient Prescriptions  Medication Sig Dispense Refill  . atenolol (TENORMIN) 50 MG tablet Take 50 mg by mouth daily.        . benazepril-hydrochlorthiazide (LOTENSIN HCT) 20-12.5 MG per tablet Take 1 tablet by mouth daily.        . DULoxetine (CYMBALTA) 60 MG capsule Take 60 mg by mouth daily.        Marland Kitchen gabapentin (NEURONTIN) 300 MG capsule Take 300 mg by mouth 2 (two) times daily.        Marland Kitchen HYDROcodone-acetaminophen (NORCO) 5-325 MG per tablet Take 1 tablet by mouth every 6 (six) hours as needed.       . lovastatin (MEVACOR) 20 MG tablet Take 20 mg by mouth at bedtime.        . polyethylene glycol (MIRALAX / GLYCOLAX) packet Take 17 g by mouth daily.        . RABEprazole (ACIPHEX) 20 MG tablet Take 20 mg by mouth daily.        Marland Kitchen zolpidem (AMBIEN) 10 MG tablet Take 10 mg by mouth daily.       Marland Kitchen DISCONTD: HYDROcodone-acetaminophen (LORTAB 5) 5-500 MG per tablet Take 1 tablet  by mouth every 6 (six) hours as needed.        Marland Kitchen DISCONTD: zolpidem (AMBIEN CR) 12.5 MG CR tablet Take 12.5 mg by mouth at bedtime as needed.        . Lidocaine-Hydrocortisone Ace (ANAMANTLE HC) 3-0.5 % KIT Apply anorectally bid for two weeks.  28 each  0    Allergies as of 10/04/2010 - Review Complete 10/04/2010  Allergen Reaction Noted  . Codeine    . Penicillins    . Sulfonamide derivatives      ROS:  General: Negative for anorexia, weight loss, fever, chills, fatigue, weakness. ENT: Negative for hoarseness, difficulty swallowing , nasal congestion. CV: Negative for chest pain, angina, palpitations, dyspnea on exertion, peripheral edema.  Respiratory: Negative for dyspnea at rest, dyspnea on exertion, cough, sputum, wheezing.  GI: See history of present illness. GU:  Negative for dysuria, hematuria, urinary incontinence, urinary frequency, nocturnal urination.  Endo: Negative for unusual weight change.    Physical Examination:   BP 140/79  Pulse 70  Temp 98 F (36.7 C)  Ht 5\' 2"  (1.575 m)  Wt 202 lb 9.6 oz (91.899 kg)  BMI 37.06 kg/m2  SpO2 100%  General: Well-nourished, well-developed in no acute distress.  Eyes: No icterus. Mouth: Oropharyngeal mucosa  moist and pink , no lesions erythema or exudate. Lungs: Clear to auscultation bilaterally.  Heart: Regular rate and rhythm, no murmurs rubs or gallops.  Abdomen: Bowel sounds are normal, nontender, nondistended, no hepatosplenomegaly or masses, no abdominal bruits or hernia , no rebound or guarding.   Extremities: No lower extremity edema.  Neuro: Alert and oriented x 4   Skin: Warm and dry, no jaundice.   Psych: Alert and cooperative, normal mood and affect.

## 2010-10-04 NOTE — Progress Notes (Signed)
Reviewed by R. Michael Lashawne Dura, MD FACP FACG 

## 2010-10-04 NOTE — Assessment & Plan Note (Signed)
Trial of AnaMantle twice a day for 2 weeks.

## 2010-10-04 NOTE — Assessment & Plan Note (Signed)
Do surveillance colonoscopy in January 2015.

## 2010-10-04 NOTE — Progress Notes (Signed)
Reminder in epic to have EGD in 02/2012 with RMR

## 2010-10-04 NOTE — Assessment & Plan Note (Signed)
Granular cell tumor of the esophagus. Recent EGD/EUS by Dr. Harlen Labs showed no evidence of recurrent tumor. Next EGD due in September of 2013 by Dr. Jena Gauss.

## 2010-10-04 NOTE — Assessment & Plan Note (Addendum)
Poorly controlled. AcipHex works better than the other PPIs. Never been on twice a day dosing. Symptoms may be exacerbated due to underlying gastroparesis. Obtain gastric emptying study. Also complains of chronic nausea and bloating which may be due to gastroparesis. Further recommendations to follow.

## 2010-10-05 ENCOUNTER — Telehealth: Payer: Self-pay | Admitting: Gastroenterology

## 2010-10-05 MED ORDER — PRAMOXINE-HC 1-2.5 % EX CREA: TOPICAL_CREAM | CUTANEOUS | Status: DC

## 2010-10-05 NOTE — Telephone Encounter (Signed)
Received fax from [facility]. Anamantle not covered on insurance. Try Analpram.

## 2010-10-08 ENCOUNTER — Encounter (HOSPITAL_COMMUNITY)
Admission: RE | Admit: 2010-10-08 | Discharge: 2010-10-08 | Disposition: A | Discharge status: Home / Self Care | Payer: Medicare Other | Source: Ambulatory Visit | Attending: Gastroenterology | Admitting: Gastroenterology

## 2010-10-08 ENCOUNTER — Encounter (HOSPITAL_COMMUNITY): Payer: Self-pay

## 2010-10-08 DIAGNOSIS — R110 Nausea: Secondary | ICD-10-CM | POA: Insufficient documentation

## 2010-10-08 DIAGNOSIS — K219 Gastro-esophageal reflux disease without esophagitis: Secondary | ICD-10-CM

## 2010-10-08 MED ORDER — TECHNETIUM TC 99M SULFUR COLLOID
2.0000 | Freq: Once | INTRAVENOUS | Status: AC | PRN
  Administered 2010-10-08: 2 via ORAL

## 2010-10-12 ENCOUNTER — Encounter (INDEPENDENT_AMBULATORY_CARE_PROVIDER_SITE_OTHER): Payer: Medicare Other | Admitting: Psychiatry

## 2010-10-12 DIAGNOSIS — F319 Bipolar disorder, unspecified: Secondary | ICD-10-CM

## 2010-10-15 NOTE — Progress Notes (Signed)
Addended by: Peggyann Shoals on: 10/15/2010 03:56 PM   Modules accepted: Orders

## 2010-10-15 NOTE — Progress Notes (Signed)
Pt is scheduled for 04/25 @ 7:45- she is aware.Marland KitchenMarland Kitchen

## 2010-10-17 ENCOUNTER — Ambulatory Visit (HOSPITAL_COMMUNITY)
Admission: RE | Admit: 2010-10-17 | Discharge: 2010-10-17 | Disposition: A | Discharge status: Home / Self Care | Payer: Medicare Other | Source: Ambulatory Visit | Attending: Gastroenterology | Admitting: Gastroenterology

## 2010-10-17 DIAGNOSIS — R109 Unspecified abdominal pain: Secondary | ICD-10-CM | POA: Insufficient documentation

## 2010-10-17 DIAGNOSIS — R110 Nausea: Secondary | ICD-10-CM

## 2010-10-25 ENCOUNTER — Other Ambulatory Visit: Payer: Self-pay | Admitting: Internal Medicine

## 2010-10-25 DIAGNOSIS — R110 Nausea: Secondary | ICD-10-CM

## 2010-10-29 ENCOUNTER — Encounter (HOSPITAL_COMMUNITY)
Admission: RE | Admit: 2010-10-29 | Discharge: 2010-10-29 | Disposition: A | Discharge status: Home / Self Care | Payer: Medicare Other | Source: Ambulatory Visit | Attending: Internal Medicine | Admitting: Internal Medicine

## 2010-10-29 ENCOUNTER — Encounter (HOSPITAL_COMMUNITY): Payer: Self-pay

## 2010-10-29 DIAGNOSIS — R109 Unspecified abdominal pain: Secondary | ICD-10-CM | POA: Insufficient documentation

## 2010-10-29 DIAGNOSIS — R110 Nausea: Secondary | ICD-10-CM | POA: Insufficient documentation

## 2010-10-29 MED ORDER — TECHNETIUM TC 99M MEBROFENIN IV KIT
5.0000 | PACK | Freq: Once | INTRAVENOUS | Status: AC | PRN
  Administered 2010-10-29: 4.97 via INTRAVENOUS

## 2010-11-05 ENCOUNTER — Ambulatory Visit (HOSPITAL_COMMUNITY)
Admission: RE | Admit: 2010-11-05 | Discharge: 2010-11-05 | Disposition: A | Discharge status: Home / Self Care | Payer: Medicare Other | Source: Ambulatory Visit | Attending: Internal Medicine | Admitting: Internal Medicine

## 2010-11-05 ENCOUNTER — Encounter: Payer: Self-pay | Admitting: Internal Medicine

## 2010-11-05 DIAGNOSIS — Z139 Encounter for screening, unspecified: Secondary | ICD-10-CM

## 2010-11-05 DIAGNOSIS — Z1231 Encounter for screening mammogram for malignant neoplasm of breast: Secondary | ICD-10-CM | POA: Insufficient documentation

## 2010-11-06 NOTE — H&P (Signed)
[name], [name]               ACCOUNT NO.:  [id number]   MEDICAL RECORD NO.:  [id number]          PATIENT TYPE:  AMB   LOCATION:  DAY                           FACILITY:  [facility]   PHYSICIAN:  R. Roetta Sessions, M.D. DATE OF BIRTH:  1945-11-06   DATE OF ADMISSION:  DATE OF DISCHARGE:  LH                              HISTORY & PHYSICAL   CHIEF COMPLAINT:  Consult for colonoscopy, bad reflux.   HISTORY OF PRESENT ILLNESS:  The patient is a very pleasant 65 year old  African American female who presents today to schedule colonoscopy.  She  has a history of adenomatous cecal polyp removed back in December 2006  by Dr. Jena Gauss and she was recommended to have a 3-year followup.  She  also had an EGD at that time and had a patulous EG junction, but  otherwise normal study.  She has been doing reasonably well.  She tells  me that her heartburn is poorly controlled.  She has it all the time.  She complains of nocturnal symptoms.  She has frequent regurgitations.  Denies any dysphagia or odynophagia.  Complains of lots of indigestion.  She has intermittent lower abdominal pain in the setting of  constipation.  Lately, she has not been able to take the MiraLax, she  states they cause her too many bowel movements.  She denies any blood in  the stool or melena.  She has failed Zegerid, Prevacid, and now  Protonix.  She has been on Protonix for about 3 weeks currently.  She  does not recall ever taking Prilosec, Aciphex, Kapidex, Zegerid, or  Nexium.   CURRENT MEDICATIONS:  1. Nasonex 2 puffs p.r.n.  2. Goody's Powder once a week as needed.  3. Cymbalta 60 mg daily.  4. Atenolol 100 mg daily.  5. Neurontin 300 mg in the morning and at lunch and 900 mg in the      evening.  6. Lotensin/hydrochlorothiazide 20/12.5 mg b.i.d.  7. Ambien 10 mg at bedtime.  8. MiraLax 17 g daily as needed.  9. Pantoprazole 40 mg daily.  10.Lovastatin 20 mg daily.  11.Amlodipine 5 mg daily.   ALLERGIES:   PENICILLIN, CODEINE, and SULFA.   PAST MEDICAL HISTORY:  1. Hypertension.  2. Fibromyalgia.  3. Osteoarthritis.  4. Anxiety depression.  5. Chronic back pain.  6. Angina.  7. History of adenomatous polyps as outlined above.  8. Chronic GERD.  9. The patient states she has a history of thyroid nodules in the      remote past.   PAST SURGICAL HISTORY:  1. Right knee surgery.  2. Cartilage repair of her back in the 90s.  3. She had a total knee replacement in 2002.  4. Right shoulder surgery for bone spurs.  5. Left breast lumpectomy without malignancy.  6. Tonsillectomy.  7. Hysterectomy.  8. Right oophorectomy due to cyst.  9. Lung biopsy, which was benign.   FAMILY HISTORY:  Mother died with MI at age 29.  Father died of stroke  at age 86.  Paternal aunt and uncle had colon cancer and other cousin  had a colon cancer.   SOCIAL HISTORY:  She is single.  She has one child.  She is retired Lawyer.  She has never been a smoker.  No alcohol use.   REVIEW OF SYSTEMS:  GI:  See HPI.  CONSTITUTIONAL:  Her weight is down  from 227 in May 2007 down to 184.  CARDIOPULMONARY:  No chest pain,  shortness of breath, palpitations, or cough.  GENITOURINARY:  No dysuria  or hematuria.   PHYSICAL EXAMINATION:  VITAL SIGNS:  Weight 184, height 5 feet 3 inches,  temperature 97.8, blood pressure 118/82, and pulse 64.  GENERAL:  Pleasant, obese, white female in no acute distress.  SKIN:  Warm and dry.  No jaundice.  HEENT:  Sclerae nonicteric.  Oropharyngeal mucosa moist and pink.  No  lesions, erythema, or exudates.  No lymphadenopathy or thyromegaly.  CHEST:  Lungs are clear to auscultation.  CARDIAC:  Regular rate and rhythm.  Normal S1 and S2.  No murmurs, rubs,  or gallops.  ABDOMEN:  Positive bowel sounds.  Abdomen is soft.  She has mild  epigastric tenderness to deep palpation.  No rebound or guarding.  No  organomegaly or masses.  No abdominal bruits or hernias.  LOWER EXTREMITIES:  No  edema.   IMPRESSION:  The patient is a 65 year old lady with history of chronic  constipation, adenomatous polyps, and refractory gastroesophageal reflux  disease.  She is due for a 3-year surveillance colonoscopy.  Her  constipation undertreated because she developed diarrhea with MiraLax  17g daily.  She has daily refractory gastroesophageal reflux disease  despite multiple proton pump inhibitor therapies.   PLAN:  1. Colonoscopy and EGD in the near future.  2. Kapidex 60 mg daily, #15.  3. Stop pantoprazole.  4. Try MiraLax half a capful daily as needed for constipation.  5. Further recommendations to follow.  6. The patient voiced concerns about her history of thyroid nodules      and sensation of fullness in her neck on a daily basis.  We      recommend she follow up with Dr. Felecia Shelling.      Tana Coast, P.AJonathon Bellows, M.D.  Electronically Signed    LL/MEDQ  D:  06/09/2008  T:  06/10/2008  Job:  [id number]   cc:   Tesfaye D. Felecia Shelling, MD  Fax: [phone]

## 2010-11-06 NOTE — Letter (Signed)
August 25, 2008    Tesfaye D. Felecia Shelling, MD  [address]  [place], [place] [address]   RE:  [name], [name]  MRN:  [id number]  /  DOB:  01/27/1946   Dear Ninetta Lights,   It was my pleasure evaluating [name] in the office today for  episodic syncope associated with diaphoresis and chest discomfort.  This  nice woman was evaluated by Dr. Tenny Craw approximately 8 years ago for  palpitations.  No specific cardiology problems were identified at that  time.  She did well until perhaps a year or two ago when she began to  experience episodes of diaphoresis followed by lightheadedness/falls.  She has not sustained any serious injury.  She cannot specify the  frequency of these events, but they appear to occur fairly frequently.  Typically, the onset is sudden and unpredictable and occurs when she is  in a standing or sitting position.  If she can reach for support from  surrounding objects, she frequently does not fall.  Episodes last a  matter of seconds to minutes at the most.  She has no particular history  of orthostatic hypotension.  She has not recently noted palpitations.  She has some associated chest discomfort.  It is difficult to determine  how frequently this occurs in conjunction with her spells or whether it  is a common accompaniment of the other symptoms.   Past medical history is notable for number of gastrointestinal issues  including GERD, asymptomatic diverticuli, and colonic polyps removed in  both 2006 and 2010.  She has hypertension and hyperlipidemia, but no  known vascular disease.  She also has a history of fibromyalgia,  osteoarthritis, anxiety, depression, and chronic low back pain.   Prior surgeries have been extensive and have included right total knee  arthroplasty, orthopedic surgery including on the lumbosacral spine and  right shoulder.  She has had a left breast excisional biopsy for benign  disease.  Remotely, she underwent tonsillectomy, hysterectomy,  right  oophorectomy, and lung biopsy for benign disease.   Recent medications include:  1. Kapidex ER 60 mg daily.  2. Cymbalta 60 mg daily.  3. Amlodipine 5 mg daily.  4. Atenolol 100 mg daily.  5. Benazepril/HCT 20/12.5 mg b.i.d.  6. Neurontin 300 mg a.m. and 900 mg p.m.  7. Lovastatin 20 mg nightly.  8. Ambien 10 mg nightly.  9. Neurontin was started for fibromyalgia, but continued to assist      with her sleep disturbance.  She is not certain that it is      providing benefit in either of these arenas.   She reports allergies to CODEINE, SULFA DRUGS, and PENICILLIN.   SOCIAL HISTORY:  She has never used tobacco products; she denies  excessive use of alcohol.  She is retired from work as a Lawyer and is  single with 1 child.   FAMILY HISTORY:  Father died due to CVA and mother due to myocardial  infarction.  She has 2 sisters, one of whom has cardiac problems.   REVIEW OF SYSTEMS:  Notable for intermittent headaches, the need for  corrective lenses, partial dentures, intermittent constipation, history  of peptic ulcer disease, continuing discomfort in her knees and elbows  due to osteoarthritis and/or fibromyalgia.  All other systems reviewed  and are negative.   PHYSICAL EXAMINATION:  GENERAL:  Pleasant overweight woman in no acute  distress.  VITAL SIGNS:  The weight is 182.  Blood pressure 130/80 without  orthostatic change,  heart rate 65 and regular, respirations 13 and  unlabored.  HEENT:  Anicteric sclerae; normal lids and conjunctivae; normal oral  mucosa.  NECK:  No jugular venous distention; normal carotid upstrokes without  bruits.  ENDOCRINE:  No thyromegaly.  HEMATOPOIETIC:  No adenopathy.  SKIN:  No significant lesions.  LUNGS:  Clear.  CARDIAC:  Normal first and second heart sounds.  ABDOMEN:  Soft and nontender; no organomegaly.  EXTREMITIES:  Distal pulses intact; no edema.  NEUROLOGIC:  Symmetric strength and tone; normal cranial nerves.   EKG:  Sinus  rhythm; borderline first-degree AV block; PVCs and PACs; ST-  T-wave abnormalities consistent with inferior and anterior ischemia or  LVH.  Comparison with prior tracing of January 06, 2001:  Except for the  presence of ventricular and supraventricular ectopy, there is no  significant change.   IMPRESSION:  Ms. Meigs describes symptoms suggestive of transient  cerebral hypoperfusion, most likely related to cardiac issues.  She  could have paroxysmal bradyarrhythmia, less likely a tachyarrhythmia or  neurocardiogenic syncope.  Her chest discomfort is quite atypical for  coronary artery disease.  We will proceed with stress testing and event  recording.  Basic laboratory studies including a TSH will be obtained.  I will plan to reassess this nice woman again in 1 month after initial  testing has been completed.   Thank you so much for sending her to see me.    Sincerely,      Gerrit Friends. Dietrich Pates, MD, [facility]  Electronically Signed    RMR/MedQ  DD: 08/26/2008  DT: 08/26/2008  Job #: [id number]

## 2010-11-06 NOTE — Assessment & Plan Note (Signed)
NAMEMarland Kitchen  Donna, Houston                CHART#:  [id number]   DATE:  07/06/2008                       DOB:  03-22-1946   Review of pathology from EGD and colonoscopy on July 04, 2008:  1. Colon polyp, adenoma.  Recommend repeat colonoscopy 5 years.  2. Esophageal mass.  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  Discussed the case      with Dr. Dierdre Searles, pathologist, who felt this was a benign lesion and      very much remained intact, and this was only cold biopsied.  I have      called [name] to discuss and left a message on her answering      machine for return call.  I feel this lesion needs further      evaluation and consideration of resection over at [facility].  We will      get her to see Dr. Lanell Matar over there for consideration of repeat      EGD, EUS and resection as he deems appropriate.  We will send the      pertinent medical records.       Jonathon Bellows, M.D.  Electronically Signed     RMR/MEDQ  D:  07/06/2008  T:  07/06/2008  Job:  [id number]   cc:   Tesfaye D. Felecia Shelling, MD

## 2010-11-06 NOTE — Letter (Signed)
September 28, 2008    Tesfaye D. Felecia Shelling, MD  [address]  [place], [place] [address]   RE:  [name], [name]  MRN:  [id number]  /  DOB:  06-Jul-1945   Dear [name]:   [name] returns to the office for continued and assessment treatment  of recurrent and chronic syncope.  Since her last visit, she has had no  further spells.  She did note 1 episode of dizziness, but she was seated  and did not lose consciousness nor fall.  She now tells me that she  experiences a vague sensation in her right neck in association with  episodes of lightheadedness.  There is no vertigo.  She cannot  characterize this sensation to any degree that allows me to understand  exactly what it represents.   She wore an event recorder with multiple episodes of fatigue and chest  pain reported, but no lightheadedness or syncope.  Rhythm was normal  sinus with occasional PACs.   Her basic lab work was normal including a CBC, chemistry profile, and  TSH.   Echocardiogram and stress echocardiogram were also normal.   Medications are unchanged from her last visit.   PHYSICAL EXAMINATION:  GENERAL:  Pleasant, somewhat overweight woman in  no acute distress.  VITAL SIGNS:  The weight is 184, 3 pounds more than at her than in 2002.  Blood pressure 125/80, heart rate 65 and regular, respirations 14 and  unlabored.  NECK:  No jugular venous distention; no carotid bruits.  LUNGS:  Clear.  CARDIAC:  Normal first and second heart sounds; modest systolic murmur  at the left sternal border.  ABDOMEN:  Soft and nontender; no organomegaly.  EXTREMITIES:  No edema; normal distal pulses.   Carotid sinus massage was performed.  There is no significant decrease  in heart rate nor blood pressure.   IMPRESSION:  [name] has an impressive history without any apparent  cardiac disease.  She is not interested in an implantable loop recorder.  I suggested that she increase her salt intake and report any additional  symptoms to me immediately after they occur.  Otherwise, I will reassess  this nice woman in 3 months.    Sincerely,      Gerrit Friends. Dietrich Pates, MD, [facility]  Electronically Signed    RMR/MedQ  DD: 09/28/2008  DT: 09/29/2008  Job #: [id number]

## 2010-11-06 NOTE — Op Note (Signed)
NAMEWALLY, BEHAN               ACCOUNT NO.:  [id number]   MEDICAL RECORD NO.:  [id number]          PATIENT TYPE:  AMB   LOCATION:  DAY                           FACILITY:  [facility]   PHYSICIAN:  R. Roetta Sessions, M.D. DATE OF BIRTH:  08-15-1945   DATE OF PROCEDURE:  DATE OF DISCHARGE:                               OPERATIVE REPORT   EGD with biopsy followed by ileocolonoscopy with snare polypectomy.   INDICATIONS FOR PROCEDURE:  A 65 year old lady with history of colonic  polyps here for surveillance.  She also has refractory gastroesophageal  reflux disease.  Symptoms had failed to respond to multiple proton pump  inhibitors.  EGD and colonoscopy are now being done.  Risks, benefits,  alternatives, limitations have been reviewed.  Please see the  documentation in the medical record.  In the preop area. Ms. Niedermeier  stated she did not take the polyethylene glycol powder and just a  flavoring packet, but she tells me that she did not take the flavoring  packet but did mix up the polyethylene glycol into her prep.  We had  initially planned not to do colonoscopy because she did not take her  prep adequately, but appears that she may well have done so, so I told  Ms. Heeg we go ahead and do the EGD and set out to do a colonoscopy  afterwards if the prep was adequate and we can complete the exam.   PROCEDURE NOTE:  O2 saturation, blood pressure, pulse, respirations were  monitored throughout the entirety of the procedure.   CONSCIOUS SEDATION:  Versed 5 mg IV and Demerol 125 mg IV in divided  doses.   INSTRUMENT:  Pentax video chip system.   PROCEDURE NOTE:  Cetacaine spray for topical pharyngeal anesthesia.   FINDINGS:  EGD examination of the tubular esophagus revealed a 1-cm  broadly based pedunculated lesion with central area of ulceration 2 cm  of the EG junction.  Please see photographs.  The surrounding and  remainder of the entire esophageal mucosa appeared entirely normal.   EG  junction was easily traversed.  Stomach:  Gastric cavity was emptied,  insufflated well with air.  Thorough examination of the gastric mucosa  including retroflexion of the proximal stomach and esophagogastric  junction revealed only a small hiatal hernia.  Pylorus was patent,  easily traversed.  Examination of the bulb and second portion revealed  no abnormalities.   THERAPEUTIC/DIAGNOSTIC MANEUVERS:  The nodular mass in the distal  esophagus was biopsied.  It bled minimally.  The patient tolerated the  procedure well, was prepared for colonoscopy.  Digital rectal exam  revealed no abnormalities.  Endoscopic findings:  Prep was adequate.  Colon:  Colonic mucosa was surveyed from the rectosigmoid junction  through the left transverse, right colon, appendiceal orifice, ileocecal  valve, and cecum.  These structures were well seen and photographed for  the record.  Terminal ileum was intubated 5 cm.  From this level, the  scope was slowly and cautiously withdrawn.  All previously mentioned  mucosal surfaces were again seen.  The patient was noted to  have sigmoid  diverticula, and two 4-mm polyps in the mid descending colon which were  cold snared and recovered.  The remainder of colonic mucosa appeared  normal.  Scope was pulled down the rectum.  A thorough examination of  the rectal mucosa including retroflex view of the anal verge  demonstrated only minimal internal hemorrhoids.  The patient tolerated  both procedures well, was reactive to Endoscopy.   IMPRESSION:  1. A 1-cm pedunculated lesion in distal esophagus as described above,      suspicious for leiomyoma versus other process status post biopsy.      Remainder of the esophageal mucosa appeared normal.  2. A small hiatal hernia, otherwise normal stomach, D1 and D2.   COLONOSCOPY FINDINGS:  Normal rectum, sigmoid diverticula, 2 mid  descending colon polyp status post cold snare removed.  Remainder of  colonic mucosa and  terminal ileal mucosa appeared normal.   RECOMMENDATIONS:  1. Begin Kapidex 60 mg orally daily.  This was the plan when she was      in the office, but Ms. Cassell tells me she got neither samples nor      a prescription.  Per her report, we will have her go up to office      for free samples today, and she will be taking Kapidex 60 mg orally      daily to see how this works for her reflux symptoms.  We will      follow up on path from the nodular biopsy today.  This will need      further attention.  She may actually end up with an endoscopic      ultrasound to further characterize this lesion depending on path.  2. Diverticulosis polyp literature provided to Ms. Hart Rochester.  Followup      on path and polyp removed today.  Further recommendations to follow      in the very near future.      Jonathon Bellows, M.D.  Electronically Signed     RMR/MEDQ  D:  07/04/2008  T:  07/04/2008  Job:  [id number]   cc:   Tesfaye D. Felecia Shelling, MD  Fax: [phone]

## 2010-11-09 ENCOUNTER — Encounter (INDEPENDENT_AMBULATORY_CARE_PROVIDER_SITE_OTHER): Payer: Medicare Other | Admitting: Psychiatry

## 2010-11-09 DIAGNOSIS — F319 Bipolar disorder, unspecified: Secondary | ICD-10-CM

## 2010-11-09 NOTE — Procedures (Signed)
   NAME:  [name], [name]                         ACCOUNT NO.:  [id number]   MEDICAL RECORD NO.:  [id number]                   PATIENT TYPE:  INP   LOCATION:  A219                                 FACILITY:  [facility]   PHYSICIAN:  Fredirick Maudlin, M.D.              DATE OF BIRTH:  05/09/1946   DATE OF PROCEDURE:  12/30/2001  DATE OF DISCHARGE:  01/01/2002                                EKG INTERPRETATION   TIME/DATE:  1055 hours/December 30, 2001.   The rhythm is sinus rhythm with a rate in the 80s.  There are diffuse  nonspecific ST-T wave changes.  Minimally abnormal electrocardiogram.                                               Fredirick Maudlin, M.D.    ELH/MEDQ  D:  02/01/2002  T:  02/07/2002  Job:  [id number]

## 2010-11-09 NOTE — H&P (Signed)
[facility]  Patient:    [name], [name] Visit Number: [id number] MRN: [id number]          Service Type: MED Location: 2A A219 01 Attending Physician:  Avon Gully Dictated by:   Avon Gully, M.D. Admit Date:  12/30/2001 Discharge Date: 01/01/2002                           History and Physical  CHIEF COMPLAINT:  Epigastric and chest pain.  HISTORY OF PRESENT ILLNESS:  This is a 65 year old black female who has a history of multiple medical illnesses who came to the emergency room.  The patient claimed she had the sudden onset of epigastric pain which radiated to the left side of the chest.  The pain awakened her from bed, and it has persisted.  She tried to take some pain medications; however, the symptoms continued.  The patient came to the emergency room where she was give pain medication, nitroglycerin, and GI cocktail.  The pain, however, slightly improved, but she continued to have chest pain on the left side.  No nausea, vomiting, or diaphoresis.  The patient was admitted on telemetry, and serial EKGs and cardiac enzymes were done.  REVIEW OF SYSTEMS:  The patient has a headache.  No cough.  No shortness of breath, palpitations, dysuria, urgency, or frequency of urination.  PAST MEDICAL HISTORY: 1. Hypertension. 2. Chronic headache. 3. Back pain. 4. Degenerative joint disease. 5. Status post knee replacement. 6. Bronchitis. 7. Anxiety and depression disorder.  CURRENT MEDICATIONS: 1. Atenolol 100 mg p.o. q.d. 2. Lotensin HCT 20/12.5 mg p.o. b.i.d. 3. Flexeril 10 mg p.o. b.i.d. 4. Xanax 1 mg p.o. t.i.d. 5. K-Dur 10 mEq p.o. q.d. 6. Lortab 5/500 1 tablet p.o. q.6h.  SOCIAL HISTORY:  The patient is single.  She is currently unemployed.  No history of alcohol, tobacco, or substance abuse.  PHYSICAL EXAMINATION:  GENERAL:  Alert, awake, and sick-looking.  VITAL SIGNS:  Blood pressure 120/60, pulse 70, respiratory rate  20, temperature 99.5 degrees Fahrenheit.  HEENT:  Pupils are equal and reactive.  NECK:  Supple.  CHEST:  Decreased air entry.  Bilateral rhonchi.  CARDIOVASCULAR:  First and second heart sounds heard.  No murmur, no rub.  ABDOMEN:  Soft and relaxed.  Bowel sounds are positive.  No mass, no organomegaly.  EXTREMITIES:  No leg edema.  ADMISSION LABORATORY DATA:  WBC 6.1, hemoglobin 13.2, hematocrit 39.8, platelets 195.  Sodium 139, potassium 3.3, chloride 105, carbon dioxide 28, glucose 139, BUN 14, creatinine 1.0, calcium 9.7.  CPK 87, CK-MB 0.3, troponin 0.01.  ASSESSMENT: 1. Chest pain, rule out myocardial ischemia versus gastroesophageal reflux    disease. 2. Hypertension. 3. Degenerative joint disease. 4. Status post knee replacement. 5. Depression and anxiety disorder.  PLAN:  Will continue EKG and cardiac enzymes.  Will continue Protonix 40 mg p.o. q.d.  Will continue patient on her current regimen. Dictated by:   Avon Gully, M.D. Attending Physician:  Avon Gully DD:  12/31/01 TD:  01/03/02 Job: [id number]

## 2010-11-09 NOTE — Discharge Summary (Signed)
[facility]  Patient:    [name], [name] Visit Number: [id number] MRN: [id number]          Service Type: SUR Location: 4W 0469 01 Attending Physician:  Pierce Crane Dictated by:   Ralene Bathe, P.A. Admit Date:  02/16/2001 Discharge Date: 02/21/2001                             Discharge Summary  ADMISSION DIAGNOSES: 1. End-stage osteoarthritis of right lower extremity. 2. History of asthmatic bronchitis. 3. Migraines. 4. Fibromyalgia. 5. Hemorrhoids.  DISCHARGE DIAGNOSES: 1. End-stage osteoarthritis of right lower extremity. 2. History of asthmatic bronchitis. 3. Migraines. 4. Fibromyalgia. 5. Hemorrhoids. 6. Status post right total knee arthroplasty. 7. Postoperative hemorrhagic anemia requiring transfusion. 8. Postoperative hypokalemia, resolved.  OPERATIONS:  Right total knee arthroplasty.  Surgeon:  Dr. Jene Every. Assistant:  Irena Cords, P.A.C.  Anesthesia:  General.  HISTORY OF PRESENT ILLNESS:  [name] is a 65 year old female with end-stage osteoarthritis refractory to conservative measures.  She was found to have end-stage osteoarthritis affecting her activities of daily living.  She wished to proceed with surgical intervention.  The risks and benefits were discussed with the patient in detail, and she wished to proceed.  HOSPITAL COURSE:  The patient was admitted and underwent the above-noted procedure and tolerated this well.  All appropriate IV antibiotics and analgesics were utilized.  Postoperatively she was placed on DVT and PE prophylaxis with Coumadin.  She was also placed weightbearing as tolerated to the right lower extremity.  Postoperatively the patient was noted to be hypokalemic.  Initially she was treated with IV potassium chloride added to her fluids.  She was still noted to be hypokalemic; therefore, oral supplementations were added.  Repeat checks showed this to be normalized prior to her  discharge.  The patient did have some leukocyte esterase on admission urine.  A recheck showed a normal culture with no evidence of infection. Postoperatively the patient did progress to have a symptomatic postoperative hemorrhagic anemia requiring a transfusion.  Her admission hemoglobin was 13, and she dropped to 8.4 and was symptomatic and, on date February 20, 2001, she was transfused two units with good result.  On her date of discharge she was stable at 10.8.  She had some mild elevation of temperatures to 99 as well as 100; however, bedside incentive spirometry was utilized, and this resumed a normal temperature by postoperative day #4.  She progressed well with therapy. After being transfused she had met all of her discharge goals.  At this time she was stable for discharge to home with home health PT and R.N.  At this time her incision was clean and dry.  She had minimal swelling.  She was voiding and having a bowel movement without difficulty.  The patient was stable for discharge on date February 21, 2001.  LABORATORY DATA:  Admission hemoglobin 13.4, postoperatively down to 8.6 and 8.4.  After transfusion she was at 10.8.  Admission chemistries within normal limits except for mild hypokalemia at 3.3.  Postoperatively she dropped to 3.0.  After supplementation she was normalized at 3.6.  Multiple protimes and INRs found on the chart on Coumadin.  Urinalysis showed small leukocyte esterase on admission.  A urine culture on February 19, 2001, showed multiple species and contaminant noted, two units found transfused in the chart.  X-ray shows chest x-ray with no active disease on date February 10, 2001. Status  post right total knee replacement on February 16, 2001, in good alignment.  CONDITION ON DISCHARGE:  Stable and improved.  ACTIVITY:  The patient is being discharged to home with home health PT and R.N.  She is weightbearing as tolerated.  Total knee patient protocol.  FOLLOW-UP:   Two weeks postoperatively, call for a time.  MEDICATIONS:  Prescriptions given for: 1. Coumadin per pharmacy. 2. Robaxin 500 mg 1 every eight hours p.r.n. spasms. 3. Percocet 5/325 mg 1-2 every four to six hours p.r.n. pain. 4. Trinsicon #60 1 b.i.d.  DISCHARGE INSTRUCTIONS:  She will call the office for any further problems. Resume home medications, home diet.  She has K-Dur which she will take at home once a day. Dictated by:   Ralene Bathe, P.A. Attending Physician:  Pierce Crane DD:  02/26/01 TD:  02/27/01 Job: [id number]

## 2010-11-09 NOTE — Op Note (Signed)
NAMECHANCI, OJALA               ACCOUNT NO.:  [id number]   MEDICAL RECORD NO.:  [id number]          PATIENT TYPE:  AMB   LOCATION:  DAY                           FACILITY:  [facility]   PHYSICIAN:  R. Roetta Sessions, M.D. DATE OF BIRTH:  1946/04/15   DATE OF PROCEDURE:  06/05/2005  DATE OF DISCHARGE:                                 OPERATIVE REPORT   PROCEDURE:  Esophagogastroduodenoscopy, diagnostic, followed by colonoscopy  with biopsy.   INDICATIONS FOR PROCEDURE:  The patient is a 65 year old lady with chronic  lower abdominal pain, constipation, intermittent hematochezia. Barium enema  indicated a sigmoid diverticulosis, possible sessile polyp at the splenic  flexure. Sigmoidoscopy back in 2000 was unremarkable. She has long standing  gastroesophageal reflux disease. EGD and colonoscopy are now being done.  This approach has been discussed with the patient at length. Potential  risks, benefits, and alternatives have been reviewed and questions answered.  She is agreeable. Please see documentation in the medical record.   PROCEDURE NOTE:  O2 saturation, blood pressure, pulse, and respirations were  monitored throughout the entirety of both procedures. Conscious sedation  with Versed 6 mg IV and Demerol 150 mg IV in divided doses. Cetacaine spray  for topical oropharyngeal anesthesia.   INSTRUMENT:  Olympus video chip system.   FINDINGS:  Examination of the tubular esophagus revealed somewhat patulous  EG junction. Otherwise esophageal mucosa appeared normal. EG junction easily  traversed.   Stomach:  Gastric cavity was empty and insufflated well with air. Thorough  examination of gastric mucosa including retroflexed view of the proximal  stomach and esophagogastric junction demonstrated no abnormalities. Pylorus  patent and easily traversed. Examination of bulb and second portion revealed  no abnormalities.   THERAPEUTIC/DIAGNOSTIC MANEUVERS:  None.   The patient tolerated  the procedure well and was prepared for colonoscopy.  Digital rectal revealed no abnormalities.   ENDOSCOPIC FINDINGS:  Prep was adequate.   Rectum:  Examination of the rectal mucosa including retroflexed view of the  anal verge and ____________ view of the anal canal demonstrated only anal  canal hemorrhoids.   Colon:  Colonic mucosa was surveyed from the rectosigmoid junction through  the left, transverse, and right colon to the area of the appendiceal  orifice, ileocecal valve, and cecum. These structures were well seen and  photographed for the record. From this level, the scope was slowly  withdrawn, and all previously mentioned mucosal surfaces were again seen.  The patient had a 2-mm polyp at the base of the cecum. She had sigmoid  diverticula. The remainder of the colonic mucosa appeared normal. The  patient tolerated the procedures well and was reactive to endoscopy.   IMPRESSION:  Patulous esophagogastric junction, otherwise normal esophagus,  stomach, D1 and D2.   Colonoscopy findings:  Anal canal hemorrhoids. Otherwise normal rectum.  Sigmoid diverticulosis. Diminutive polyp at the cecum, cold  biopsied/removed. The remainder of the colonic mucosa appeared normal.   RECOMMENDATIONS:  1.  Begin Zegerid 40 mg orally daily.  2.  Gastroesophageal reflux disease literature provided to Ms. Hart Rochester.  3.  Hemorrhoid and diverticulosis  literature provided to Ms. Hart Rochester.  4.  Daily Metamucil or Citrucel fiber supplement.  5.  Ten-day course of Anusol HC suppositories 1 per rectum at bedtime.  6.  Follow up on pathology.  7.  Further recommendations to follow.      Jonathon Bellows, M.D.  Electronically Signed     RMR/MEDQ  D:  06/05/2005  T:  06/05/2005  Job:  [id number]

## 2010-11-09 NOTE — Discharge Summary (Signed)
   NAME:  [name], [name]                         ACCOUNT NO.:  [id number]   MEDICAL RECORD NO.:  [id number]                   PATIENT TYPE:  INP   LOCATION:  A219                                 FACILITY:  [facility]   PHYSICIAN:  Tesfaye D. Felecia Shelling, M.D.              DATE OF BIRTH:  1946-02-04   DATE OF ADMISSION:  12/30/2001  DATE OF DISCHARGE:  01/01/2002                                 DISCHARGE SUMMARY   DISCHARGE DIAGNOSES:  1. Chest pain, probably noncardiac.  2. Hypertension.  3. Degenerative joint disease.  4. Status post knee replacement.  5. Depression.  6. Anxiety disorder.  7. Gastroesophageal reflux disease.   DISCHARGE MEDICATIONS:  1. Lortab 500 mg one tablet p.o. q.6h. p.r.n.  2. Protonix 40 mg p.o. q.d.  3. Atenolol 50 mg p.o. q.d.  4. Lotensin HCT 20/25 one tablet p.o. b.i.d.  5. __________ 10 mg p.o. b.i.d.  6. Xanax 1 mg p.o. t.i.d.  7. K-Dur 10 mg p.o. q.d.   DISPOSITION:  The patient was discharged home in stable condition.   HOSPITAL COURSE:  This is a 65 year old black female with a history of  multiple medical illnesses, who was admitted due to epigastric and left area  chest pain.  She was admitted under telemetry.  Serial EKG and cardiac  enzymes were done.  All the cardiac enzymes and the EKG were within normal  limits.  The patient symptoms improved with pain medications and Protonix.  She was discharged to home in a stable condition, to be followed in  outpatient.                                               Tesfaye D. Felecia Shelling, M.D.    TDF/MEDQ  D:  01/18/2002  T:  01/24/2002  Job:  [id number]

## 2010-11-09 NOTE — H&P (Signed)
[facility]  Patient:    [name], TOSTENSON Visit Number: [id number] MRN: [id number]          Service Type: Attending:  Javier Docker, M.D. Dictated by:   Ottie Glazier. Wynona Neat, P.A.-C.                           History and Physical  SCHEDULED SURGERY DATE:  February 16, 2001.  DATE OF BIRTH:  [birth year].  CHIEF COMPLAINT:  Right knee pain.  HISTORY OF PRESENT ILLNESS:  [name] is a 65 year old black female with a long history of right knee pain. She has underwent two arthroscopies as well as conservative treatment; however, she complains of increasing right knee pain with mechanical symptoms of popping, clicking, and locking. She states that this pain is now interfering with her normal daily activities.  ALLERGIES:  NONSTEROIDAL ANTI-INFLAMMATORY DRUGS, CODEINE, PENICILLIN, and SULFA.  MEDICATIONS: 1. Tylox one to two p.o. q.4-6h. p.r.n. pain. 2. Lotensin HCT 20/12.5 one q.d. 3. Atenolol 100 mg q.d. 4. KCl 10 mEq one p.o. q.d. 5. Seroquel 100 mg one p.o. q.h.s. 6. Xanax 1 mg t.i.d. 7. Remeron 30 mg q.h.s.  PAST MEDICAL HISTORY: 1. Significant for asthmatic bronchitis with six to seven exacerbations per    year. 2. Migraines. 3. Fibromyalgia. 4. Hemorrhoids.  PAST SURGICAL HISTORY:  She had a tonsillectomy in 1964, hysterectomy and RSO in 1974, left breast lumpectomy, benign findings, in 1972, right knee scope x 2 in 1990 and 2001 as well as a left shoulder "bone spurs removed".  SOCIAL HISTORY:  She is a single black female with one child. 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 She denies any tobacco or alcohol abuse. She lives in a two level home.  FAMILY PHYSICIAN:  Dr. ______ .  CARDIOLOGIST:  Dietrich Pates, M.D., [facility]  FAMILY HISTORY:  Mother with coronary artery disease, hypertension, breast cancer and kidney disease. Father has history of colon cancer, hypertension, coronary artery  disease.  REVIEW OF SYSTEMS:  GENERAL: Patient does have night sweats. Currently is on no hormonal manipulation secondary to history of breast lump. Denies any loss in weight, loss in appetite, fever, or chills. HEENT: She does wear corrective lenses. She does have glasses. She states that she has had a history of a left ear infection, now resolving. She has a history of migraines, has a history of thyroid goiter, now resolved. CHEST: Patient denies any current or chronic productive cough, hemoptysis, or nonproductive cough. CARDIOVASCULAR: Patient states that she does have a fast heartbeat at times for which she has recently seen Dr. Tenny Craw with unremarkable findings. No chest pains, no syncopal episodes. GI/GU: Patient states she has a history of irritable bowel syndrome with bouts of diarrhea and constipation. Past history of blood in her bowel movements secondary to hemorrhoids. No dysuria, nocturia, frequency, or urgency. EXTREMITIES: Please see HPI. NEURO: She states that her right arm frequently falls asleep at night.  PHYSICAL EXAMINATION:  VITAL SIGNS:  Blood pressure is 160/98, pulse 60, respirations 12.  GENERAL:  This is a pleasant 65 year old black female in no acute distress.  HEENT:  Head is atraumatic, normocephalic. She is wearing corrective lenses. She does have dentures. Pharynx is negative.  NECK:  Supple, no palpable masses or carotid bruits noted.  CHEST:  Clear to auscultation bilaterally.  BREAST:  Deferred.  HEART:  S1, S2, regular rate and rhythm. No evidence of  murmurs noted.  ABDOMEN:  Soft, no guarding, no rebound. Bowel sounds were positive. No hepatosplenomegaly.  GENITOURINARY:  Deferred.  EXTREMITIES:  Patient is tender along the right medial joint line up the knee. She has mild patellofemoral pain with compression. Range of motion is approximately 0-140. There is no instability. Ipsilateral hip and ankle exams are unremarkable. 1+ dorsalis  pedis pulse.  LABORATORY, X-RAY, MRI:  There is a radicular tricompartmental osteoarthritis with severe degenerative changes in the medial compartment and tibiofemoral articulation.  IMPRESSION:   End-stage osteoarthritis, right knee.  PLAN:  Right total knee arthroplasty per Dr. Shelle Iron. Patient will be admitted on February 16, 2001 to undergo this per Dr. Shelle Iron at 12:30 p.m. All questions were entertained and the surgery was discussed in detail. The patient states good understanding to the risk and benefits of the surgery prior to entering the operating suite. Dictated by:   Ottie Glazier. Wynona Neat, P.A.-C. Attending:  Javier Docker, M.D. DD:  02/10/01 TD:  02/10/01 Job: [id number]

## 2010-11-09 NOTE — Op Note (Signed)
[facility]  Patient:    MAICEE, ULLMAN EVA Visit Number: [id number] MRN: [id number]          Service Type: SUR Location: 4W [id number] [id number] Attending Physician:  Pierce Crane Proc. Date: 02/16/01 Adm. Date:  02/16/2001                             Operative Report  PREOPERATIVE DIAGNOSIS:  Degenerative joint disease right knee.  POSTOPERATIVE DIAGNOSIS:  Degenerative joint disease right knee.  SURGEON:  Javier Docker, M.D.  ASSISTANT:  Harrie Foreman, P.A.-C.  PROCEDURE:  Right total knee arthroplasty.  ANESTHESIA:  General.  COMPONENTS:  Scorpio cruciate-retaining 5 femoral, 5 tibial, 26 mm patella, 12 mm insert.  BRIEF HISTORY AND INDICATION:  A 65 year old with end-stage osteoarthrosis refractory to conservative treatment.  Operative intervention was indicated for replacement of degenerated knee joint.  Risks and benefits discussed including bleeding, infection, damage to neurovascular structures, component failure, need for revision, etc.  DESCRIPTION OF PROCEDURE:  The patient is in supine position.  After the induction of adequate general anesthesia, 1 g of Kefzol, the right lower extremity was prepped and draped and exsanguinated in the usual sterile fashion.  Thigh tourniquet inflated to 350 mmHg.  A standard anterior incision was made over the knee.  Electrocautery was utilized to achieve hemostasis.  A median parapatellar arthrotomy was performed.  The knee was flexed.  Synovial fluid was evacuated and was clear.  Hypertrophic synovium was noted, and this was excised.  Tricompartmental osteoarthrosis was noted and debrided.  Step drill utilized to enter the femoral canal.  This was irrigated.  Distal femoral cutting block was applied 10 mm.  Distal femoral cut was made, sized to a 5 pin.  Anterior and posterior and chamfer cuts performed.  This was done in the usual fashion.  Next, the ACL was removed, menisci removed,  tibial subluxed forward.  Oscillating saw utilized to remove the tibial spine.  This was sized to a base of 5.  Bushing was applied, tibial canal entered, enlarged, irrigated, and evacuated.  Intermedullary guide placed, 4 mm was selected off the defect which was medially.  The proximal tibial jig was then pinned in the appropriate rotation, oscillating saw utilized to make the proximal tibial cut with intravascular structures and all elements well-protected at all times.  The PCL was preserved.  Osteophytes were removed.  A trial of a 5 tibial tray and a 10 insert was applied.  The tibial rotation was marked with utilization of an external alignment guide. Following this, the patella was then prepared, clamp applied, measured to a 26, although it was a small patella.  Reamed 10 mm, peg holes reamed.  Next, all instrumentation was then removed and pulsatile lavage utilized to clean the joint.  Clean towels were applied.  After the appropriate cement curing, knee was flexed, tibial surfaces were dried.  Just prior to this, the base plate was reattached in the appropriate rotation utilizing the external alignment guide, dissected the medial malleolus just medial to the tibial tubercle, and a punch guide was utilized to further prepare the tibia.  After this was then dried, again, cement was placed in the tibia, the 5 component selected.  This was then impacted in the proximal tibia in the appropriate rotation.  A trial 10 mm insert was placed upon that.  The femur was then cemented to the femur and then the cement removed from both.  The knee was then reduced.  ______ extension with the axial mode applied.  Redundant cement removed.  The patella, a 26, was then cemented to the patella recess, clamp was utilized, redundant cement removed after the appropriate curing. Joint inspection after the trial insert was removed, redundant cement removed. Posteriorly was inspected and no intervening soft  tissue.  The ACL was intact. The knee was then subluxed after trialing up to a 12, and a 12 permanent insert was then placed and impacted and inspected.  The polyethylene was flush.  It was then reduced in range to a 0-140 range, good stability varus valgus stressing 0-30 degrees.  Next, due to slight lateral tracking, the lateral retinaculum reducement was performed.  The wound again copiously irrigated, and Hemovac was placed and brought out through a lateral stab wound in the skin.  Patellar arthrotomy was repaired with #1 Vicryl interrupted figure-of-eight sutures.  The subcutaneous tissue reapproximated with two Vicryl simple sutures.  The skin was reapproximated with staples.  The wound was dressed sterilely.  Marcaine with epinephrine was infiltrated into the joint.  The tourniquet was deflated at about 1 hour and 45 minutes, and adequate revascularization of the lower extremity was appreciated.  The patient tolerated the procedure well with no known complications. Attending Physician:  Pierce Crane DD:  02/16/01 TD:  02/17/01 Job: [id number]

## 2010-11-09 NOTE — Procedures (Signed)
   NAME:  [name], [name]                         ACCOUNT NO.:  [id number]   MEDICAL RECORD NO.:  [id number]                   PATIENT TYPE:  INP   LOCATION:  A219                                 FACILITY:  [facility]   PHYSICIAN:  Fredirick Maudlin, M.D.              DATE OF BIRTH:  17-Oct-1945   DATE OF PROCEDURE:  12/30/2001  DATE OF DISCHARGE:  01/01/2002                                EKG INTERPRETATION   TIME/DATE:  1132 hours/December 30, 2001.   The rhythm is sinus rhythm with a rate in the 70s.  Diffuse nonspecific ST-T  wave changes are seen.  Minimally abnormal electrocardiogram.                                               Fredirick Maudlin, M.D.    ELH/MEDQ  D:  02/01/2002  T:  02/07/2002  Job:  [id number]

## 2010-11-09 NOTE — Consult Note (Signed)
NAMEKRUPA, Houston               ACCOUNT NO.:  [id number]   MEDICAL RECORD NO.:  [id number]          PATIENT TYPE:  AMB   LOCATION:                                FACILITY:  [facility]   PHYSICIAN:  Lionel December, M.D.    DATE OF BIRTH:  October 09, 1945   DATE OF CONSULTATION:  05/28/2005  DATE OF DISCHARGE:                                   CONSULTATION   REFERRING PHYSICIAN:  Melvyn Novas, M.D.   REASON FOR CONSULTATION:  Abdominal pain, constipation, abnormal barium  enema.   HISTORY OF PRESENT ILLNESS:  [name] is a 65 year old, African-American  female patient of Dr. Janna Arch who presents today for further evaluation of  abnormal barium enema, abdominal pain and constipation.  She says she has  been having abdominal cramping with alternating constipation and diarrhea  for years.  Dr. Karilyn Cota saw her in the past for IBS.  However, generally at  this time she is having more constipation.  She may go eight days at a time  without a bowel movement.  Her bowels are usually soft when she does go.  She continues to have intermittent abdominal cramping.  She has noted toilet  tissue hematochezia.  Her biggest concern is that of 4 months ago when she  started having fecal soilage of her undergarments.  She would not be aware  that this was occurring until she went to the bathroom.  Otherwise, she does  not have any fecal incontinence.  About 5 years ago, she was treated by Dr.  Lodema Hong for IBS on high-fiber diet, fiber supplement with stool softener.  She did not like the way this worked and therefore she stopped the  medication.  She has chronic nausea, but no vomiting.  She also has  indigestion all the time.  She had been on Prevacid with fair results, but  this was stopped for unclear reasons according to the patient by Dr. Oval Linsey.  In the past, Nexium has worked for her as well.  She had a  flexible sigmoidoscopy in May 2000, with a few petechiae at the sigmoid  colon, but otherwise normal to the splenic flexure.  In 1989, she had an EGD  which revealed erosive gastritis and granular-appearing duodenal mucosa.   CURRENT MEDICATIONS:  1.  Nasonex 2 puffs daily.  2.  Goody's one per week p.r.n.  3.  Cymbalta 60 mg daily.  4.  Atenolol 100 mg daily.  5.  Xanax 1 mg daily.  6.  Neurontin 300 mg t.i.d.  7.  Abilify 5 mg nightly.  8.  Lotensin/HCTZ 20/12.5 mg b.i.d.  9.  Allegra 60 mg b.i.d.  10. Adderall 10 mg b.i.d.  11. Ambien 10 mg nightly.  12. Ultracet one four times a day as needed.   ALLERGIES:  CODEINE.  SULFA.  PENICILLIN.   PAST MEDICAL HISTORY:  1.  Hypertension.  2.  Fibromyalgia.  3.  Osteoarthritis.  4.  Anxiety.  5.  Depression.  6.  Chronic back pain.  7.  Right knee surgery cartilage repair over 10 years ago, but  in 2002, she      had a total knee replacement.  8.  Right shoulder surgery for bone spurs.  9.  She had a left breast lumpectomy without malignancy.  10. Tonsillectomy.  11. Hysterectomy.  12. Right oophorectomy due to cyst.  13. Lung biopsy which was benign.   FAMILY HISTORY:  Mother died of MI at age 30.  Father died of stroke at age  20.  Paternal aunt and uncle had colon cancer.   SOCIAL HISTORY:  She is single and has one child.  She is disabled.  She has  never been a smoker.  Denies any alcohol use.   REVIEW OF SYSTEMS:  GASTROINTESTINAL:  See HPI.  CARDIOPULMONARY:  No chest  pain or shortness of breath.  GENITOURINARY:  No dysuria.   PHYSICAL EXAMINATION:  VITAL SIGNS:  Weight 231, height 5 feet 3 inches,  temperature 98.1, blood pressure 122/70, pulse 62.  GENERAL:  Pleasant, obese, black female in no acute distress.  SKIN:  Warm and dry, no jaundice.  HEENT:  Conjunctivae are pink.  Sclerae nonicteric.  Oropharyngeal mucosa  moist and pink.  No lesions, erythema or exudate.  No lymphadenopathy or  thyromegaly.  CHEST:  Lungs clear to auscultation.  CARDIAC:  Regular rate and rhythm with  normal S1, S2, no murmurs, rubs or  gallops.  ABDOMEN:  Positive bowel sounds, obese, soft.  She has mild epigastric  tenderness to deep palpation.  No organomegaly or masses.  No rebound  tenderness or guarding.  No abdominal bruits or hernias.  EXTREMITIES:  No edema.   IMPRESSION:  [name] is a 65 year old, African-American female with  history of chronic lower abdominal pain, constipation, felt to be due to  irritable bowel syndrome.  She recently has had some hematochezia, fecal  soilage and abnormal barium enema with minimal sigmoid diverticulosis and a  tiny sessile polyp at the splenic flexure.  She has redundant hepatic  flexure.  She has never had a complete colonoscopy.  She also has chronic  gastroesophageal reflux disease, poorly controlled at this time.   PLAN:  1.  Colonoscopy and EGD.  She will receive GoLYTELY prep.  2.  No aspirin or Goody's powders x4 days prior to procedure.  3.  Nexium 40 mg daily, #15 samples provided.   I would like to thank Dr. Vickey Huger DonDiego for allowing [place] to take  part in the care of this patient.      Tana Coast, P.A.      Lionel December, M.D.  Electronically Signed    LL/MEDQ  D:  05/28/2005  T:  05/28/2005  Job:  [id number]

## 2010-11-26 ENCOUNTER — Other Ambulatory Visit: Payer: Self-pay

## 2010-11-27 MED ORDER — RABEPRAZOLE SODIUM 20 MG PO TBEC: 20.0000 mg | DELAYED_RELEASE_TABLET | Freq: Every day | ORAL | Status: DC

## 2010-12-10 ENCOUNTER — Ambulatory Visit (INDEPENDENT_AMBULATORY_CARE_PROVIDER_SITE_OTHER): Payer: Medicare Other | Admitting: Gastroenterology

## 2010-12-10 ENCOUNTER — Encounter: Payer: Self-pay | Admitting: Gastroenterology

## 2010-12-10 VITALS — BP 147/77 | HR 57 | Temp 97.2°F | Ht 62.0 in | Wt 206.0 lb

## 2010-12-10 DIAGNOSIS — K5900 Constipation, unspecified: Secondary | ICD-10-CM

## 2010-12-10 DIAGNOSIS — R1032 Left lower quadrant pain: Secondary | ICD-10-CM

## 2010-12-10 DIAGNOSIS — R1013 Epigastric pain: Secondary | ICD-10-CM | POA: Insufficient documentation

## 2010-12-10 DIAGNOSIS — K219 Gastro-esophageal reflux disease without esophagitis: Secondary | ICD-10-CM

## 2010-12-10 DIAGNOSIS — K5909 Other constipation: Secondary | ICD-10-CM | POA: Insufficient documentation

## 2010-12-10 MED ORDER — RABEPRAZOLE SODIUM 20 MG PO TBEC: 20.0000 mg | DELAYED_RELEASE_TABLET | Freq: Two times a day (BID) | ORAL | Status: DC

## 2010-12-10 NOTE — Progress Notes (Signed)
Cc to PCP 

## 2010-12-10 NOTE — Assessment & Plan Note (Signed)
She never tried taking half cap full of MiraLax daily as recommended at last office visit. Encouraged her to try this. Otherwise she can take MiraLax one capful at least 3-4 times weekly in order to treat her constipation.

## 2010-12-10 NOTE — Assessment & Plan Note (Signed)
Acute on chronic. CT abdomen and pelvis with IV and oral contrast. Obtain labs.

## 2010-12-10 NOTE — Progress Notes (Signed)
Primary Care Physician: Avon Gully, MD  Primary Gastroenterologist:  Roetta Sessions, MD  Chief Complaint  Patient presents with  . Follow-up    abdominal pain, constipation, gerd    HPI: [name] is a 65 y.o. female here for followup of constipation, abdominal pain, GERD. She was a couple months ago for the same. She underwent a gastric empty study which was normal. Abdominal ultrasound and HIDA scan were normal as well. Complains of burning in chest on Aciphex BID. 50% of time symptoms are controlled. Sometimes wakes up with burning. Really careful with diet. BM still sluggish. Miralax and Benefiber prn. Cannot take daily or gets diarrhea. Abdominal bloating. Pain still present in left lower quadrant, chronic and intermittent but lately has been present most days. No dysuria or hematuria. She was treated for urinary tract infection a couple months ago. Since she was here last she had a breast biopsy which she reports to be benign. She is not satisfied with her surgery stating that she has abnormal contour of the breast as well as persistent pain since biopsy.    Current Outpatient Prescriptions  Medication Sig Dispense Refill  . amLODipine (NORVASC) 5 MG tablet 5 mg daily.       Marland Kitchen atenolol (TENORMIN) 50 MG tablet Take 50 mg by mouth daily.        . DULoxetine (CYMBALTA) 60 MG capsule Take 60 mg by mouth daily.        Marland Kitchen gabapentin (NEURONTIN) 300 MG capsule Take 300 mg by mouth 2 (two) times daily.        Marland Kitchen HYDROcodone-acetaminophen (NORCO) 5-325 MG per tablet Take 1 tablet by mouth every 6 (six) hours as needed.       Marland Kitchen losartan (COZAAR) 100 MG tablet Take 100 mg by mouth daily.        Marland Kitchen lovastatin (MEVACOR) 20 MG tablet Take 20 mg by mouth at bedtime.        . polyethylene glycol (MIRALAX / GLYCOLAX) packet Take 17 g by mouth daily.        . RABEprazole (ACIPHEX) 20 MG tablet Take 1 tablet (20 mg total) by mouth 2 (two) times daily.  60 tablet  5  . zolpidem (AMBIEN) 10 MG tablet  Take 10 mg by mouth daily.       Marland Kitchen DISCONTD: RABEprazole (ACIPHEX) 20 MG tablet Take 1 tablet (20 mg total) by mouth daily.  31 tablet  5  . DISCONTD: RABEprazole (ACIPHEX) 20 MG tablet Take 20 mg by mouth 2 (two) times daily.        . benazepril-hydrochlorthiazide (LOTENSIN HCT) 20-12.5 MG per tablet Take 1 tablet by mouth daily.        . Lidocaine-Hydrocortisone Ace (ANAMANTLE HC) 3-0.5 % KIT Apply anorectally bid for two weeks.  28 each  0  . pramoxine-hydrocortisone cream Apply to affected area 3 times daily  30 g  1    Allergies as of 12/10/2010 - Review Complete 12/10/2010  Allergen Reaction Noted  . Codeine    . Penicillins    . Sulfonamide derivatives      ROS:  General: Negative for anorexia, weight loss, fever, chills, fatigue, weakness. ENT: Negative for hoarseness, difficulty swallowing , nasal congestion. CV: Negative for chest pain, angina, palpitations, dyspnea on exertion, peripheral edema.  Respiratory: Negative for dyspnea at rest, dyspnea on exertion, cough, sputum, wheezing.  GI: See history of present illness. GU:  Negative for dysuria, hematuria, urinary incontinence, urinary frequency, nocturnal urination.  Endo:  Negative for unusual weight change.    Physical Examination:   BP 147/77  Pulse 57  Temp(Src) 97.2 F (36.2 C) (Temporal)  Ht 5\' 2"  (1.575 m)  Wt 206 lb (93.441 kg)  BMI 37.68 kg/m2  General: Well-nourished, well-developed in no acute distress.  Eyes: No icterus. Mouth: Oropharyngeal mucosa moist and pink , no lesions erythema or exudate. Lungs: Clear to auscultation bilaterally.  Heart: Regular rate and rhythm, no murmurs rubs or gallops.  Abdomen: Bowel sounds are normal, mild epigastric and LLQ tenderness, nondistended, no hepatosplenomegaly or masses, no abdominal bruits or hernia , no rebound or guarding.   Extremities: No lower extremity edema.  Neuro: Alert and oriented x 4   Skin: Warm and dry, no jaundice.   Psych: Alert and  cooperative, normal mood and affect.

## 2010-12-10 NOTE — Assessment & Plan Note (Signed)
Failed omeprazole, Nexium, Zegerid, protonix, Dexilant. Some modest improvement on AcipHex twice daily. We'll continue for now. Given epigastric pain, will do blood work and CT abdomen and pelvis with contrast.

## 2010-12-11 LAB — CBC WITH DIFFERENTIAL/PLATELET
Basophils Absolute: 0 10*3/uL (ref 0.0–0.1)
Basophils Relative: 0 % (ref 0–1)
Eosinophils Absolute: 0.1 10*3/uL (ref 0.0–0.7)
Eosinophils Relative: 1 % (ref 0–5)
HCT: 40.7 % (ref 36.0–46.0)
Hemoglobin: 13.1 g/dL (ref 12.0–15.0)
Lymphocytes Relative: 38 % (ref 12–46)
Lymphs Abs: 2 10*3/uL (ref 0.7–4.0)
MCH: 27 pg (ref 26.0–34.0)
MCHC: 32.2 g/dL (ref 30.0–36.0)
MCV: 83.7 fL (ref 78.0–100.0)
Monocytes Absolute: 0.4 10*3/uL (ref 0.1–1.0)
Monocytes Relative: 8 % (ref 3–12)
Neutro Abs: 2.8 10*3/uL (ref 1.7–7.7)
Neutrophils Relative %: 53 % (ref 43–77)
Platelets: 208 10*3/uL (ref 150–400)
RBC: 4.86 MIL/uL (ref 3.87–5.11)
RDW: 16.1 % — ABNORMAL HIGH (ref 11.5–15.5)
WBC: 5.3 10*3/uL (ref 4.0–10.5)

## 2010-12-11 LAB — COMPREHENSIVE METABOLIC PANEL
ALT: 10 U/L (ref 0–35)
AST: 17 U/L (ref 0–37)
Albumin: 4.4 g/dL (ref 3.5–5.2)
Alkaline Phosphatase: 73 U/L (ref 39–117)
BUN: 17 mg/dL (ref 6–23)
CO2: 26 mEq/L (ref 19–32)
Calcium: 10.1 mg/dL (ref 8.4–10.5)
Chloride: 106 mEq/L (ref 96–112)
Creat: 0.83 mg/dL (ref 0.50–1.10)
Glucose, Bld: 101 mg/dL — ABNORMAL HIGH (ref 70–99)
Potassium: 3.8 mEq/L (ref 3.5–5.3)
Sodium: 143 mEq/L (ref 135–145)
Total Bilirubin: 0.3 mg/dL (ref 0.3–1.2)
Total Protein: 6.7 g/dL (ref 6.0–8.3)

## 2010-12-11 LAB — LIPASE: Lipase: 17 U/L (ref 0–75)

## 2010-12-11 LAB — TSH: TSH: 1.145 u[IU]/mL (ref 0.350–4.500)

## 2010-12-12 ENCOUNTER — Ambulatory Visit (HOSPITAL_COMMUNITY): Admission: RE | Admit: 2010-12-12 | Payer: Medicare Other | Source: Ambulatory Visit

## 2010-12-12 NOTE — Progress Notes (Signed)
CT canceled.

## 2010-12-18 ENCOUNTER — Encounter (INDEPENDENT_AMBULATORY_CARE_PROVIDER_SITE_OTHER): Payer: Medicare Other | Admitting: Psychiatry

## 2010-12-18 DIAGNOSIS — F3189 Other bipolar disorder: Secondary | ICD-10-CM

## 2011-01-15 ENCOUNTER — Encounter: Payer: Self-pay | Admitting: Internal Medicine

## 2011-03-12 ENCOUNTER — Encounter (INDEPENDENT_AMBULATORY_CARE_PROVIDER_SITE_OTHER): Payer: Medicare Other | Admitting: Psychiatry

## 2011-03-12 DIAGNOSIS — F3189 Other bipolar disorder: Secondary | ICD-10-CM

## 2011-03-18 NOTE — Progress Notes (Signed)
[name], [name]               ACCOUNT NO.:  [id number]  MEDICAL RECORD NO.:  [id number]  LOCATION:                                 FACILITY:  PHYSICIAN:  Ivy Puryear T. Daveon Arpino, M.D.   DATE OF BIRTH:  1946/06/23                                PROGRESS NOTE   HISTORY OF PRESENT ILLNESS: The patient came in today for her follow-up appointment.  She was last seen on December 18, 2010.  At that visit, the patient was concerned that her pain medicine had been stopped by her primary care doctor, Dr. Felecia Shelling.  However, recently, she was able to get pain medicine from him and now her anxiety and sleep have been much better.  She also recently saw Dr. Gerilyn Pilgrim, who is her neurologist, and the patient has been seeing him for chronic headaches.  I reviewed the records from Dr. Gerilyn Pilgrim.  The patient is taking multiple medications for pain and she was also recently started on a muscle relaxant, Robaxin 500 mg four times a day, by him.  The patient continued to endorse chronic headaches and does not feel that any medicine is able to relieve her headache.  It is also recommended by Dr. Gerilyn Pilgrim that the patient may have rebound headaches by taking too many medications.  The patient reported that she sleeps, but does not sleep very good.  Her mood has been stable.  She denies any side effects of the medications at this time.  MEDICAL HISTORY: 1. Chronic headache. 2. Some memory problems. 3. Hypertension. 4. Fibromyalgia. 5. Insomnia.  CURRENT MEDICATIONS: 1. Cymbalta 60 mg daily. 2. Albuterol inhaler as needed. 3. Vicodin 5/500 mg twice a day. 4. Neurontin 300 mg one in the morning and four at bedtime. 5. Losartan 100 mg one daily. 6. Ambien 10 mg daily. 7. Atenolol 50 mg daily. 8. Amlodipine 5 mg daily. 9. Lovastatin 20 mg daily. 10.Zyrtec 10 mg as needed. 11.Rabeprazole 20 mg one daily. 12.MiraLAX 1 tablespoon once a day. 13.Robaxin 500 mg four times a day.  ALLERGIES: THE PATIENT IS  ALLERGIC TO PENICILLIN.  On her last visit with Dr. Gerilyn Pilgrim, her vitals were BMI 36.1, blood pressure 159/87, height 63 inches, pulse 66 per minute and weight 203 pounds.  MENTAL STATUS EXAMINATION: The patient is casually dressed and appeared to be her stated age.  She maintained fair eye contact.  Her speech is soft, clear and coherent. Her thought process is logical, linear and goal-directed.  She denies any auditory hallucinations, suicidal thoughts or homicidal thoughts. There was no psychosis present.  She is alert and oriented x3.  Her insight, judgment and impulse control are okay.  ASSESSMENT: AXIS I:  Major depressive disorder, recurrent. AXIS II:  Deferred. AXIS III:  See medical history. AXIS IV:  Mild.  PLAN: I talked to the patient in detail about the use of psychiatric medication and pain medication at length.  At this time, I do not see that she has been getting any benefit with the Ambien and that may be actually causing her rebound headache.  I recommended to stop the Ambien at this time and continue her Cymbalta and Neurontin along with the pain  medicine she has been getting from her primary care doctor.  She was also recently started on a muscle relaxant, which should help some of her insomnia.  I recommended that, if she wants, she can take the Neurontin at bedtime to relieve her headache and insomnia.  I explained the risks and benefits of medication in detail.  We will discontinue the Ambien at this time.  However, we will continue the Neurontin and Cymbalta at the present doses.  I will see her again in 1 month.     Ritisha Deitrick T. Lolly Mustache, M.D.     STA/MEDQ  D:  03/12/2011  T:  03/12/2011  Job:  [id number]  Electronically Signed by Kathryne Sharper M.D. on 03/18/2011 01:44:55 PM

## 2011-04-01 ENCOUNTER — Ambulatory Visit (INDEPENDENT_AMBULATORY_CARE_PROVIDER_SITE_OTHER): Payer: Medicare Other | Admitting: Internal Medicine

## 2011-04-01 ENCOUNTER — Encounter: Payer: Self-pay | Admitting: Internal Medicine

## 2011-04-01 VITALS — BP 120/60 | HR 62 | Temp 97.6°F | Ht 62.0 in | Wt 205.6 lb

## 2011-04-01 DIAGNOSIS — R1032 Left lower quadrant pain: Secondary | ICD-10-CM

## 2011-04-01 NOTE — Patient Instructions (Signed)
CT scan abdomen and pelvis w IV / oral contrast - L sided abdominal pain  Increase Miralax to 17 grams twice daily   Stool diary   Further recommendations to follow

## 2011-04-01 NOTE — Progress Notes (Addendum)
Primary Care Physician:  Avon Gully, MD Primary Gastroenterologist:  Dr.   Pre-Procedure History & Physical: HPI:  [name] is a 65 y.o. female here for followup of left-sided abdominal pain. Temporally related to constipation. One bowel movement daily to every 3 days in spite of taking MiraLax 17 g orally daily. Chronic intermittent nausea. No vomiting. Prior GeS normal. Reflux symptoms well controlled with AcipHex. Last colonoscopy 2012. She denies rectal bleeding or melena. CT never done because there were issues with the approval from third party payer.  Past Medical History  Diagnosis Date  . HTN (hypertension)   . Hyperlipidemia   . GERD (gastroesophageal reflux disease)   . Diverticulosis   . Anxiety   . Depression   . Adenomatous colon polyp     Due surveillance 06/2013  . Chronic back pain   . Thyroid nodule   . Fibromyalgia   . GI problem     granular cell esophageal tumor (Dx 06/2008), resected via EMR 2011, due repeat EGD 02/2012  . Migraines   . Insomnia   . Chronic constipation   . High cholesterol   . Asthma     Past Surgical History  Procedure Date  . Tonsillectomy   . Abdominal hysterectomy   . Total knee arthroplasty 2002    right  . Right shoulder surgery   . Lung biopsy     negative  . Breast biopsy      benign  . Right oophorectomy   . Colonoscopy 06/2008    sigmoid tics, tubular adenoma  . Eus 08/2010    [facility] with EGD. Retained food. No recurrent esophageal lesion, bx negative.    Prior to Admission medications   Medication Sig Start Date End Date Taking? Authorizing Provider  albuterol (PROVENTIL HFA;VENTOLIN HFA) 108 (90 BASE) MCG/ACT inhaler Inhale 2 puffs into the lungs every 6 (six) hours as needed.     Yes Historical Provider, MD  amLODipine (NORVASC) 5 MG tablet 5 mg daily.  11/24/10  Yes Historical Provider, MD  atenolol (TENORMIN) 50 MG tablet Take 50 mg by mouth daily.     Yes Historical Provider, MD  benazepril-hydrochlorthiazide  (LOTENSIN HCT) 20-12.5 MG per tablet Take 1 tablet by mouth daily.     Yes Historical Provider, MD  cetirizine (ZYRTEC) 10 MG tablet Take 10 mg by mouth daily.     Yes Historical Provider, MD  DULoxetine (CYMBALTA) 60 MG capsule Take 60 mg by mouth daily.     Yes Historical Provider, MD  ergocalciferol (VITAMIN D2) 50000 UNITS capsule Take 50,000 Units by mouth once a week.     Yes Historical Provider, MD  gabapentin (NEURONTIN) 300 MG capsule Take 300 mg by mouth 2 (two) times daily.     Yes Historical Provider, MD  HYDROcodone-acetaminophen (NORCO) 5-325 MG per tablet Take 1 tablet by mouth every 6 (six) hours as needed.  09/19/10  Yes Historical Provider, MD  losartan (COZAAR) 100 MG tablet Take 100 mg by mouth daily.     Yes Historical Provider, MD  lovastatin (MEVACOR) 20 MG tablet Take 20 mg by mouth at bedtime.     Yes Historical Provider, MD  methocarbamol (ROBAXIN) 500 MG tablet Take 500 mg by mouth 4 (four) times daily.     Yes Historical Provider, MD  polyethylene glycol (MIRALAX / GLYCOLAX) packet Take 17 g by mouth daily.     Yes Historical Provider, MD  RABEprazole (ACIPHEX) 20 MG tablet Take 1 tablet (20 mg total) by  mouth 2 (two) times daily. 12/10/10  Yes Tana Coast, PA  zolpidem (AMBIEN) 10 MG tablet Take 10 mg by mouth daily.  09/25/10  Yes Historical Provider, MD  Lidocaine-Hydrocortisone Ace (ANAMANTLE HC) 3-0.5 % KIT Apply anorectally bid for two weeks. 10/04/10   Tana Coast, PA  pramoxine-hydrocortisone cream Apply to affected area 3 times daily 10/05/10 10/05/11  Tana Coast, PA    Allergies as of 04/01/2011 - Review Complete 04/01/2011  Allergen Reaction Noted  . Codeine Hives, Nausea Only, and Other (See Comments)   . Penicillins Hives   . Sulfonamide derivatives Nausea Only     Family History  Problem Relation Age of Onset  . Stroke Father   . Heart attack Mother     History   Social History  . Marital Status: Single    Spouse Name: N/A    Number of  Children: N/A  . Years of Education: N/A   Occupational History  . Not on file.   Social History Main Topics  . Smoking status: Never Smoker   . Smokeless tobacco: Never Used  . Alcohol Use: No  . Drug Use: No  . Sexually Active: No   Other Topics Concern  . Not on file   Social History Narrative  . No narrative on file    Review of Systems: See HPI, otherwise negative ROS  Physical Exam: BP 120/60  Pulse 62  Temp 97.6 F (36.4 C)  Ht 5\' 2"  (1.575 m)  Wt 205 lb 9.6 oz (93.26 kg)  BMI 37.60 kg/m2 General:   Alert,  Well-developed, well-nourished, pleasant and cooperative in NAD Head:  Normocephalic and atraumatic. Eyes:  Sclera clear, no icterus.   Conjunctiva pink. Ears:  Normal auditory acuity. Nose:  No deformity, discharge,  or lesions. Mouth:  No deformity or lesions, dentition normal. Neck:  Supple; no masses or thyromegaly. Lungs:  Clear throughout to auscultation.   No wheezes, crackles, or rhonchi. No acute distress. Heart:  Regular rate and rhythm; no murmurs, clicks, rubs,  or gallops. Abdomen: Positive bowel sounds Soft, nontender and nondistended. No masses, hepatosplenomegaly or hernias noted. Normal bowel sounds, without guarding, and without rebound.  No succussion splash Msk:  Symmetrical without gross deformities. Normal posture. Pulses:  Normal pulses noted. Extremities:  Surgical scar right knee. Without clubbing or edema. Neurologic:  Alert and  oriented x4;  grossly normal neurologically. Skin:  Intact without significant lesions or rashes. Cervical Nodes:  No significant cervical adenopathy. Psych:  Alert and cooperative. Normal mood and affect.  Impression/Plan:

## 2011-04-01 NOTE — Assessment & Plan Note (Signed)
Continued left sided abdominal pain. Constipation continues to be a problem on hydrocodone. Takes a single dose of MiraLax each morning.  Reflux symptoms well controlled on AcipHex. No blood per rectum. Nausea but no vomiting.  May well be underdosing MiraLax. CT previously recommended not yet done  Recommendations: Abdominal and pelvic CT with IV and oral contrast to evaluate left-sided abdominal pain.  Increased MiraLax 17 g orally twice daily.  A stool diary. Telephone progress report with pain in 2 weeks. Followup CT results as they become available.  Office visit 12 weeks.

## 2011-04-04 ENCOUNTER — Other Ambulatory Visit: Payer: Self-pay | Admitting: Internal Medicine

## 2011-04-04 DIAGNOSIS — R1032 Left lower quadrant pain: Secondary | ICD-10-CM

## 2011-04-05 ENCOUNTER — Ambulatory Visit: Payer: Medicare Other | Admitting: Internal Medicine

## 2011-04-05 ENCOUNTER — Other Ambulatory Visit: Payer: Self-pay | Admitting: Internal Medicine

## 2011-04-06 LAB — CREATININE, SERUM: Creat: 0.83 mg/dL (ref 0.50–1.10)

## 2011-04-08 ENCOUNTER — Encounter: Payer: Self-pay | Admitting: Cardiology

## 2011-04-10 ENCOUNTER — Ambulatory Visit (HOSPITAL_COMMUNITY)
Admission: RE | Admit: 2011-04-10 | Discharge: 2011-04-10 | Disposition: A | Discharge status: Home / Self Care | Payer: Medicare Other | Source: Ambulatory Visit | Attending: Internal Medicine | Admitting: Internal Medicine

## 2011-04-10 ENCOUNTER — Encounter: Payer: Self-pay | Admitting: Cardiology

## 2011-04-10 DIAGNOSIS — K5909 Other constipation: Secondary | ICD-10-CM | POA: Insufficient documentation

## 2011-04-10 DIAGNOSIS — R1032 Left lower quadrant pain: Secondary | ICD-10-CM | POA: Insufficient documentation

## 2011-04-10 DIAGNOSIS — R110 Nausea: Secondary | ICD-10-CM | POA: Insufficient documentation

## 2011-04-10 DIAGNOSIS — R933 Abnormal findings on diagnostic imaging of other parts of digestive tract: Secondary | ICD-10-CM | POA: Insufficient documentation

## 2011-04-10 MED ORDER — IOHEXOL 300 MG/ML  SOLN
100.0000 mL | Freq: Once | INTRAMUSCULAR | Status: AC | PRN
  Administered 2011-04-10: 100 mL via INTRAVENOUS

## 2011-04-11 ENCOUNTER — Encounter (INDEPENDENT_AMBULATORY_CARE_PROVIDER_SITE_OTHER): Payer: Medicare Other | Admitting: Psychiatry

## 2011-04-11 DIAGNOSIS — F3189 Other bipolar disorder: Secondary | ICD-10-CM

## 2011-04-15 ENCOUNTER — Encounter: Payer: Self-pay | Admitting: Cardiology

## 2011-04-15 ENCOUNTER — Encounter: Payer: Self-pay | Admitting: *Deleted

## 2011-04-15 ENCOUNTER — Ambulatory Visit (INDEPENDENT_AMBULATORY_CARE_PROVIDER_SITE_OTHER): Payer: Medicare Other | Admitting: Cardiology

## 2011-04-15 DIAGNOSIS — E785 Hyperlipidemia, unspecified: Secondary | ICD-10-CM

## 2011-04-15 DIAGNOSIS — R1310 Dysphagia, unspecified: Secondary | ICD-10-CM

## 2011-04-15 DIAGNOSIS — M5137 Other intervertebral disc degeneration, lumbosacral region: Secondary | ICD-10-CM

## 2011-04-15 DIAGNOSIS — K219 Gastro-esophageal reflux disease without esophagitis: Secondary | ICD-10-CM

## 2011-04-15 DIAGNOSIS — F32A Depression, unspecified: Secondary | ICD-10-CM

## 2011-04-15 DIAGNOSIS — F341 Dysthymic disorder: Secondary | ICD-10-CM

## 2011-04-15 DIAGNOSIS — I10 Essential (primary) hypertension: Secondary | ICD-10-CM

## 2011-04-15 DIAGNOSIS — R0789 Other chest pain: Secondary | ICD-10-CM

## 2011-04-15 DIAGNOSIS — E041 Nontoxic single thyroid nodule: Secondary | ICD-10-CM

## 2011-04-15 DIAGNOSIS — Z86018 Personal history of other benign neoplasm: Secondary | ICD-10-CM | POA: Insufficient documentation

## 2011-04-15 DIAGNOSIS — M51379 Other intervertebral disc degeneration, lumbosacral region without mention of lumbar back pain or lower extremity pain: Secondary | ICD-10-CM

## 2011-04-15 DIAGNOSIS — Z8719 Personal history of other diseases of the digestive system: Secondary | ICD-10-CM

## 2011-04-15 DIAGNOSIS — F419 Anxiety disorder, unspecified: Secondary | ICD-10-CM

## 2011-04-15 DIAGNOSIS — R002 Palpitations: Secondary | ICD-10-CM

## 2011-04-15 DIAGNOSIS — IMO0001 Reserved for inherently not codable concepts without codable children: Secondary | ICD-10-CM

## 2011-04-15 DIAGNOSIS — D126 Benign neoplasm of colon, unspecified: Secondary | ICD-10-CM

## 2011-04-15 MED ORDER — RABEPRAZOLE SODIUM 20 MG PO TBEC: 20.0000 mg | DELAYED_RELEASE_TABLET | Freq: Two times a day (BID) | ORAL | Status: DC

## 2011-04-15 MED ORDER — LOVASTATIN 40 MG PO TABS: 40.0000 mg | ORAL_TABLET | Freq: Every day | ORAL | Status: DC

## 2011-04-15 MED ORDER — TERAZOSIN HCL 5 MG PO CAPS: 5.0000 mg | ORAL_CAPSULE | Freq: Every day | ORAL | Status: DC

## 2011-04-15 NOTE — Assessment & Plan Note (Addendum)
Chest discomfort is very atypical and unlikely to reflect myocardial ischemia.  Stress echocardiogram was negative 2 years ago.  No provocative testing will be undertaken at present.  Patient is uncertain whether treatment with a PPI has improved her symptoms of gastroesophageal reflux disease.  I've asked her to increase her dosage further to 40 mg b.i.d.  If chest discomfort does not resolve, I would recommend decreasing to 20 mg per day

## 2011-04-15 NOTE — Assessment & Plan Note (Addendum)
I reassured [name] that she is 90% of the way to good blood pressure control and added Hytrin 5 mg per day to her regime.  She will continue to monitor blood pressures at home and return in one month for reassessment by the cardiology nurses at which time her dose of Hytrin will be increased if necessary.

## 2011-04-15 NOTE — Patient Instructions (Signed)
Your physician recommends that you schedule a follow-up appointment in:  1) 2 months with Dr Dietrich Pates 2) BP check in 1 month 3) Nurse visit if chest pain does not improve  Your physician has recommended you make the following change in your medication:  1) START Hytrin 5mg  daily 2) Increase Mevacor to 40mg  daily 3) Increase Aciphex to 40mg  Twice a day  Home blood pressures and bring with you to next visit.  Your physician recommends that you return for lab work in: 1 month (CBC, BMET)

## 2011-04-15 NOTE — Progress Notes (Signed)
HPI : Ms. Almanzar is seen again in the office after an 18 month hiatus at the kind request of Dr. Felecia Shelling for evaluation of chest discomfort and hypertension.  This nice woman has been evaluated intermittently by [facility] cardiology over the past decade, once for recurrent syncope and once for palpitations.  An echocardiogram and dobutamine stress echocardiogram in 2010 were normal.  A pharmacologic stress nuclear study was negative in 2002.  She has never undergone cardiac catheterization.  Hypertension has been difficult to control optimally, but recent values reported by the patient at home have been generally good with systolics not exceeding 150 and diastolics below 90.  In recent months, she has noted mild to moderate aching in the mid to lower substernal region that occurs frequently or perhaps nearly constantly.  There are no associated symptoms.  There is no relationship to meals, time of day or body position.  Symptoms are not elicited by exertion and not relieved by rest.  There may be some associated chest wall tenderness.  Current Outpatient Prescriptions on File Prior to Visit  Medication Sig Dispense Refill  . albuterol (PROVENTIL HFA;VENTOLIN HFA) 108 (90 BASE) MCG/ACT inhaler Inhale 2 puffs into the lungs every 6 (six) hours as needed.        Marland Kitchen amLODipine (NORVASC) 5 MG tablet 10 mg daily.       Marland Kitchen atenolol (TENORMIN) 50 MG tablet Take 50 mg by mouth as directed. 2 qam and 1 qhs      . cetirizine (ZYRTEC) 10 MG tablet Take 10 mg by mouth daily.        . DULoxetine (CYMBALTA) 60 MG capsule Take 60 mg by mouth daily.        . ergocalciferol (VITAMIN D2) 50000 UNITS capsule Take 50,000 Units by mouth once a week.        Marland Kitchen HYDROcodone-acetaminophen (NORCO) 5-325 MG per tablet Take 1 tablet by mouth every 6 (six) hours as needed.       . methocarbamol (ROBAXIN) 500 MG tablet Take 500 mg by mouth 4 (four) times daily.        . polyethylene glycol (MIRALAX / GLYCOLAX) packet Take 17 g by mouth  daily.        . pramoxine-hydrocortisone cream Apply to affected area 3 times daily  30 g  1  . zolpidem (AMBIEN) 10 MG tablet Take 10 mg by mouth daily.          Allergies  Allergen Reactions  . Codeine Hives, Nausea Only and Other (See Comments)    Feels funny  . Penicillins Hives  . Sulfonamide Derivatives Nausea Only      Past medical history, social history, and family history reviewed and updated.  ROS: See history of present illness.  PHYSICAL EXAM: There were no vitals taken for this visit.  General-Well developed; no acute distress Body habitus-proportionate weight and height Neck-No JVD; no carotid bruits Lungs-clear lung fields; resonant to percussion Cardiovascular-normal PMI; normal S1 and S2 Abdomen-normal bowel sounds; soft and non-tender without masses or organomegaly Musculoskeletal-No deformities, no cyanosis or clubbing Neurologic-Normal cranial nerves; symmetric strength and tone Skin-Warm, no significant lesions Extremities-distal pulses intact; no edema  EKG: Sinus bradycardia at a rate of 55 bpm; borderline first-degree AV block; nonspecific T-wave abnormality; comparison with prior EKG performed 08/25/08, ectopy is no longer present, and T-wave abnormalities have improved.   ASSESSMENT AND PLAN:

## 2011-04-15 NOTE — Assessment & Plan Note (Signed)
Control of hyperlipidemia was suboptimal when last assessed in 2010.  Lovastatin will be increased to 40 mg per day and lipid profile will be repeated.

## 2011-04-17 ENCOUNTER — Telehealth: Payer: Self-pay | Admitting: Cardiology

## 2011-04-17 NOTE — Telephone Encounter (Signed)
Patient states that she took Terazosin for the first time last night and "passed out".  States that now this morning she "can't hardly stand up".  Would like return phone call. / tg

## 2011-04-17 NOTE — Telephone Encounter (Signed)
Changed to 1 mg each evening immediately prior to sleep.

## 2011-04-17 NOTE — Telephone Encounter (Signed)
Patient states that she "blacked out and hit her head on floor" about 15 minutes after taking first dose of Terazosin yesterday afternoon.  Took blood pressure and was 120/80 after incident.  No injury sustained.  States that she can barely stand up and feels very weak and dizzy, still yet.  States she is feeling a little better as the day progresses, however is not feeling herself.  Advised her to take BP and call me back if it is abnormal.  In addition, advised her to stop taking Terazosin at this point and go to the emergency room for evaluation since she blacked out.

## 2011-04-18 ENCOUNTER — Other Ambulatory Visit: Payer: Self-pay | Admitting: *Deleted

## 2011-04-18 MED ORDER — TERAZOSIN HCL 1 MG PO CAPS: 1.0000 mg | ORAL_CAPSULE | Freq: Every day | ORAL | Status: DC

## 2011-04-18 NOTE — Telephone Encounter (Signed)
Spoke with patient.  Did not go to the emergency room and feels better.  Advised her to decrease dose to 1mg  and take right at bed time and call with any further issues.

## 2011-04-22 NOTE — Progress Notes (Signed)
Letter was sent to pt on 1/22

## 2011-05-14 ENCOUNTER — Ambulatory Visit (HOSPITAL_COMMUNITY): Payer: Medicare Other | Admitting: Psychiatry

## 2011-05-14 ENCOUNTER — Encounter (HOSPITAL_COMMUNITY): Payer: Medicare Other | Admitting: Psychiatry

## 2011-05-15 ENCOUNTER — Other Ambulatory Visit: Payer: Self-pay | Admitting: Cardiology

## 2011-05-16 LAB — COMPREHENSIVE METABOLIC PANEL
ALT: 13 U/L (ref 0–35)
AST: 19 U/L (ref 0–37)
Albumin: 4.1 g/dL (ref 3.5–5.2)
Alkaline Phosphatase: 89 U/L (ref 39–117)
BUN: 8 mg/dL (ref 6–23)
CO2: 25 mEq/L (ref 19–32)
Calcium: 10.3 mg/dL (ref 8.4–10.5)
Chloride: 105 mEq/L (ref 96–112)
Creat: 0.71 mg/dL (ref 0.50–1.10)
Glucose, Bld: 100 mg/dL — ABNORMAL HIGH (ref 70–99)
Potassium: 3.3 mEq/L — ABNORMAL LOW (ref 3.5–5.3)
Sodium: 142 mEq/L (ref 135–145)
Total Bilirubin: 0.5 mg/dL (ref 0.3–1.2)
Total Protein: 6.2 g/dL (ref 6.0–8.3)

## 2011-05-16 LAB — CBC
HCT: 39.3 % (ref 36.0–46.0)
Hemoglobin: 12.3 g/dL (ref 12.0–15.0)
MCH: 26.5 pg (ref 26.0–34.0)
MCHC: 31.3 g/dL (ref 30.0–36.0)
MCV: 84.7 fL (ref 78.0–100.0)
Platelets: 216 10*3/uL (ref 150–400)
RBC: 4.64 MIL/uL (ref 3.87–5.11)
RDW: 14.8 % (ref 11.5–15.5)
WBC: 4 10*3/uL (ref 4.0–10.5)

## 2011-05-17 ENCOUNTER — Telehealth: Payer: Self-pay | Admitting: *Deleted

## 2011-05-17 ENCOUNTER — Other Ambulatory Visit: Payer: Self-pay | Admitting: *Deleted

## 2011-05-17 ENCOUNTER — Encounter: Payer: Self-pay | Admitting: *Deleted

## 2011-05-17 DIAGNOSIS — E876 Hypokalemia: Secondary | ICD-10-CM

## 2011-05-17 NOTE — Telephone Encounter (Signed)
Normal Results called to patient.  Message left on machine.

## 2011-05-20 ENCOUNTER — Encounter: Payer: Self-pay | Admitting: Internal Medicine

## 2011-05-23 ENCOUNTER — Ambulatory Visit (INDEPENDENT_AMBULATORY_CARE_PROVIDER_SITE_OTHER): Payer: Medicare Other

## 2011-05-23 VITALS — BP 132/86 | HR 58 | Ht 63.0 in | Wt 205.0 lb

## 2011-05-23 DIAGNOSIS — I10 Essential (primary) hypertension: Secondary | ICD-10-CM

## 2011-05-23 NOTE — Progress Notes (Signed)
S: Pt. Arrives in office for 1 month BP check. B: On last OV with Dr. Dietrich Pates on 04-15-11 pt. was advised to start taking Hytrin 5 mg daily, increase Mevacor to 40 mg daily and Aciphex to 40 mg bid.  A: Pt. c/o CP (EKG obtained), fatigue and weakness. Her BP this morning is 132/86 and vitals from last OV are not available. Pt. saw Dr. Jena Gauss on 04-01-11 (about 2 weeks before last OV with [place]) and BP noted at that visit was 120/60. Pt. did not bring her BP diary to this visit but states she will try to bring to office by Monday (12-3).  R: Pt. advised to continue current medical treatment and that we will contact her with Dr. Dietrich Pates recommendations, if any./LV

## 2011-05-25 NOTE — Progress Notes (Signed)
I will review her symptoms and further adjust meds at her return office visit in late December.

## 2011-05-28 ENCOUNTER — Encounter: Payer: Self-pay | Admitting: Cardiology

## 2011-05-28 ENCOUNTER — Other Ambulatory Visit (HOSPITAL_COMMUNITY): Payer: Self-pay | Admitting: Psychiatry

## 2011-05-28 MED ORDER — ZOLPIDEM TARTRATE 10 MG PO TABS: 10.0000 mg | ORAL_TABLET | Freq: Every day | ORAL | Status: DC

## 2011-05-30 ENCOUNTER — Ambulatory Visit: Payer: Medicare Other | Attending: Neurology | Admitting: Sleep Medicine

## 2011-05-30 DIAGNOSIS — G4730 Sleep apnea, unspecified: Secondary | ICD-10-CM

## 2011-05-30 DIAGNOSIS — Z6837 Body mass index (BMI) 37.0-37.9, adult: Secondary | ICD-10-CM | POA: Insufficient documentation

## 2011-05-30 DIAGNOSIS — G4733 Obstructive sleep apnea (adult) (pediatric): Secondary | ICD-10-CM | POA: Insufficient documentation

## 2011-05-30 DIAGNOSIS — G4710 Hypersomnia, unspecified: Secondary | ICD-10-CM

## 2011-05-31 ENCOUNTER — Encounter: Payer: Self-pay | Admitting: Cardiology

## 2011-06-01 NOTE — Procedures (Signed)
[name], [name]               ACCOUNT NO.:  [id number]  MEDICAL RECORD NO.:  [id number]          PATIENT TYPE:  OUT  LOCATION:  SLEEP LAB                     FACILITY:  [facility]  PHYSICIAN:  Avanelle Pixley A. Gerilyn Pilgrim, M.D. DATE OF BIRTH:  Mar 22, 1946  DATE OF STUDY:  05/30/2011                           NOCTURNAL POLYSOMNOGRAM  REFERRING PHYSICIAN:  Ettamae Barkett A. Gerilyn Pilgrim, M.D.  INDICATION:  A 65 year old female who presents with headache, restless sleep, snoring, and obesity.  The study is being been done to evaluate for obstructive sleep apnea syndrome.  MEDICATIONS:  Hyzaar, hydrocodone, Glucotrol, AcipHex, Cymbalta, zolpidem, terazosin, amlodipine, gabapentin, atenolol, lovastatin, methocarbamol.  EPWORTH SLEEPINESS SCALE:  1.  BMI:  37.  ARCHITECTURAL SUMMARY:  The total recording time is 3 70 minutes.  Sleep efficiency 83%.  Sleep latency 29 minutes.  REM latency 97 minutes. Stage N1 of 13.5%, N2 of 51.5%, N3 of 13.2%, and REM sleep 21.7%.  RESPIRATORY SUMMARY:  Baseline oxygen saturation 98, lowest saturation 90.  Diagnostic AHI 6 and RDI 8.  LIMB MOVEMENT SUMMARY:  PLM index 6.3.  ELECTROCARDIOGRAM SUMMARY:  Average heart rate is 70 with no significant dysrhythmias observed.  IMPRESSION: 1. Mild obstructive sleep apnea syndrome. 2. Mild periodic limb movement disorder of sleep.   [name] A. Gerilyn Pilgrim, M.D.    KAD/MEDQ  D:  06/01/2011 18:34:06  T:  06/01/2011 47:82:95  Job:  [id number]

## 2011-06-03 ENCOUNTER — Other Ambulatory Visit (HOSPITAL_COMMUNITY): Payer: Self-pay | Admitting: Psychiatry

## 2011-06-14 ENCOUNTER — Encounter: Payer: Self-pay | Admitting: Internal Medicine

## 2011-06-14 ENCOUNTER — Other Ambulatory Visit: Payer: Self-pay | Admitting: Cardiology

## 2011-06-14 ENCOUNTER — Ambulatory Visit (INDEPENDENT_AMBULATORY_CARE_PROVIDER_SITE_OTHER): Payer: Medicare Other | Admitting: Internal Medicine

## 2011-06-14 VITALS — BP 132/74 | HR 58 | Temp 97.5°F | Ht 62.0 in | Wt 208.6 lb

## 2011-06-14 DIAGNOSIS — K5900 Constipation, unspecified: Secondary | ICD-10-CM

## 2011-06-14 DIAGNOSIS — K921 Melena: Secondary | ICD-10-CM | POA: Insufficient documentation

## 2011-06-14 NOTE — Progress Notes (Signed)
Primary Care Physician:  Avon Gully, MD Primary Gastroenterologist:  Dr.   Pre-Procedure History & Physical: HPI:  [name] is a 65 y.o. female here for followup of refractory constipation passed some blood per rectum recently. States one bowel movement weekly. Recent CT demonstrated some mild thickening of the rectal wall. History of colonic adenoma but normal rectum a colonoscopy about 3 years ago  Past Medical History  Diagnosis Date  . Hypertension   . Hyperlipidemia   . Gastroesophageal reflux disease   . Diverticulosis   . Anxiety and depression   . Adenomatous colon polyp 2006    excised in 2006 & 2010Due surveillance 06/2013  . Chronic back pain   . Thyroid nodule   . Fibromyalgia   . Migraines   . Insomnia   . Chronic constipation   . Asthma   . History of benign esophageal tumor 2010    granular cell esophageal tumor (Dx 06/2008), resected via EMR 2011, due repeat EGD 02/2012  . Chest discomfort   . Hiatal hernia     Past Surgical History  Procedure Date  . Tonsillectomy   . Abdominal hysterectomy   . Total knee arthroplasty 2002    Right; previous arthroscopic surgery  . Shoulder arthroscopy     Right; bone spurs removed  . Lung biopsy     negative  . 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 Right oophorectomy     benign disease  . Colonoscopy 06/2008    sigmoid tics, tubular adenoma  . Eus 08/2010    [facility] with EGD. Retained food. No recurrent esophageal lesion, bx negative.    Prior to Admission medications   Medication Sig Start Date End Date Taking? Authorizing Provider  albuterol (PROVENTIL HFA;VENTOLIN HFA) 108 (90 BASE) MCG/ACT inhaler Inhale 2 puffs into the lungs every 6 (six) hours as needed.     Yes Historical Provider, MD  amLODipine (NORVASC) 5 MG tablet 10 mg daily.  11/24/10  Yes Historical Provider, MD  atenolol (TENORMIN) 50 MG tablet Take 50 mg by mouth as directed. 2 qam and 1 qhs   Yes  Historical Provider, MD  cetirizine (ZYRTEC) 10 MG tablet Take 10 mg by mouth daily.     Yes Historical Provider, MD  DULoxetine (CYMBALTA) 60 MG capsule Take 60 mg by mouth daily.     Yes Historical Provider, MD  ergocalciferol (VITAMIN D2) 50000 UNITS capsule Take 50,000 Units by mouth once a week.     Yes Historical Provider, MD  gabapentin (NEURONTIN) 300 MG capsule Take 300 mg by mouth daily. 1 qam and 4 qhs   Yes Historical Provider, MD  HYDROcodone-acetaminophen (NORCO) 5-325 MG per tablet Take 1 tablet by mouth every 6 (six) hours as needed.  09/19/10  Yes Historical Provider, MD  losartan-hydrochlorothiazide (HYZAAR) 100-25 MG per tablet Take 1 tablet by mouth daily.     Yes Historical Provider, MD  lovastatin (MEVACOR) 40 MG tablet Take 1 tablet (40 mg total) by mouth at bedtime. 04/15/11 04/14/12 Yes Gerrit Friends. Rothbart, MD  methocarbamol (ROBAXIN) 500 MG tablet Take 500 mg by mouth 4 (four) times daily.     Yes Historical Provider, MD  polyethylene glycol (MIRALAX / GLYCOLAX) packet Take 17 g by mouth daily.     Yes Historical Provider, MD  pramoxine-hydrocortisone cream Apply to affected area 3 times daily 10/05/10 10/05/11 Yes Tana Coast, PA  RABEprazole (ACIPHEX) 20 MG tablet Take 40 mg by mouth  2 (two) times daily.   04/15/11 04/14/12 Yes Gerrit Friends. Rothbart, MD  terazosin (HYTRIN) 1 MG capsule Take 1 capsule (1 mg total) by mouth at bedtime. 04/18/11 04/17/12 Yes Gerrit Friends. Rothbart, MD  zolpidem (AMBIEN) 10 MG tablet Take 1 tablet (10 mg total) by mouth daily. 05/28/11  Yes Syed T. Arfeen, MD    Allergies as of 06/14/2011 - Review Complete 06/14/2011  Allergen Reaction Noted  . Codeine Hives, Nausea Only, and Other (See Comments)   . Penicillins Hives   . Sulfonamide derivatives Nausea Only     Family History  Problem Relation Age of Onset  . Stroke Father   . Heart attack Mother     History   Social History  . Marital Status: Single    Spouse Name: N/A    Number of  Children: N/A  . Years of Education: N/A   Occupational History  . Not on file.   Social History Main Topics  . Smoking status: Never Smoker   . Smokeless tobacco: Never Used  . Alcohol Use: No  . Drug Use: No  . Sexually Active: No   Other Topics Concern  . Not on file   Social History Narrative  . No narrative on file    Review of Systems: See HPI, otherwise negative ROS  Physical Exam: BP 132/74  Pulse 58  Temp(Src) 97.5 F (36.4 C) (Temporal)  Ht 5\' 2"  (1.575 m)  Wt 208 lb 9.6 oz (94.62 kg)  BMI 38.15 kg/m2 General:   Alert,  Well-developed, well-nourished, pleasant and cooperative in NAD Skin:  Intact without significant lesions or rashes. Eyes:  Sclera clear, no icterus.   Conjunctiva pink. Ears:  Normal auditory acuity. Nose:  No deformity, discharge,  or lesions. Mouth:  No deformity or lesions. Neck:  Supple; no masses or thyromegaly. No significant cervical adenopathy. Lungs:  Clear throughout to auscultation.   No wheezes, crackles, or rhonchi. No acute distress. Heart:  Regular rate and rhythm; no murmurs, clicks, rubs,  or gallops. Abdomen: Non-distended, normal bowel sounds.  Soft and nontender without appreciable mass or hepatosplenomegaly.  Pulses:  Normal pulses noted. Extremities:  Without clubbing or edema.  Impression/Plan:

## 2011-06-14 NOTE — Patient Instructions (Signed)
We will schedule a colonoscopy to further evaluate blood in your stool the abnormal CT and refractory constipation for January of 2013

## 2011-06-14 NOTE — Assessment & Plan Note (Signed)
Hematochezia recently in the setting of refractory constipation and suggestion of an abnormal rectal wall on recent CT. History of abnormal colonoscopy 3 years ago. I suspect this lady's constipation is largely functional plus minus drug-induced. Weber given this scenario she needs her lower GI tract imaged once again.  Recommendations: Diagnostic colonoscopy the gurney her future.The risks, benefits, limitations, alternatives and imponderables have been reviewed with the patient. Questions have been answered. All parties are agreeable.   Further management recommendations constipation following colonoscopy. She may be an Amitiza candidate

## 2011-06-15 LAB — BASIC METABOLIC PANEL
BUN: 11 mg/dL (ref 6–23)
CO2: 27 mEq/L (ref 19–32)
Calcium: 10.8 mg/dL — ABNORMAL HIGH (ref 8.4–10.5)
Chloride: 104 mEq/L (ref 96–112)
Creat: 0.86 mg/dL (ref 0.50–1.10)
Glucose, Bld: 95 mg/dL (ref 70–99)
Potassium: 3.7 mEq/L (ref 3.5–5.3)
Sodium: 141 mEq/L (ref 135–145)

## 2011-06-20 ENCOUNTER — Ambulatory Visit: Payer: Medicare Other | Admitting: Internal Medicine

## 2011-06-21 ENCOUNTER — Ambulatory Visit: Payer: Medicare Other | Admitting: Cardiology

## 2011-06-26 ENCOUNTER — Other Ambulatory Visit (HOSPITAL_COMMUNITY): Payer: Self-pay | Admitting: Psychiatry

## 2011-06-26 ENCOUNTER — Other Ambulatory Visit: Payer: Self-pay | Admitting: Gastroenterology

## 2011-06-26 DIAGNOSIS — K921 Melena: Secondary | ICD-10-CM

## 2011-06-26 MED ORDER — PEG-KCL-NACL-NASULF-NA ASC-C 100 G PO SOLR: 1.0000 | Freq: Once | ORAL | Status: DC

## 2011-06-26 MED ORDER — ZOLPIDEM TARTRATE 10 MG PO TABS: 10.0000 mg | ORAL_TABLET | Freq: Every day | ORAL | Status: DC

## 2011-07-03 ENCOUNTER — Ambulatory Visit (INDEPENDENT_AMBULATORY_CARE_PROVIDER_SITE_OTHER): Payer: Medicare Other | Admitting: Cardiology

## 2011-07-03 ENCOUNTER — Encounter: Payer: Self-pay | Admitting: Cardiology

## 2011-07-03 VITALS — BP 144/82 | HR 61 | Resp 18 | Ht 62.0 in | Wt 205.0 lb

## 2011-07-03 DIAGNOSIS — E785 Hyperlipidemia, unspecified: Secondary | ICD-10-CM

## 2011-07-03 DIAGNOSIS — R609 Edema, unspecified: Secondary | ICD-10-CM

## 2011-07-03 DIAGNOSIS — R0789 Other chest pain: Secondary | ICD-10-CM

## 2011-07-03 DIAGNOSIS — I10 Essential (primary) hypertension: Secondary | ICD-10-CM

## 2011-07-03 DIAGNOSIS — E782 Mixed hyperlipidemia: Secondary | ICD-10-CM

## 2011-07-03 DIAGNOSIS — D126 Benign neoplasm of colon, unspecified: Secondary | ICD-10-CM

## 2011-07-03 MED ORDER — TERAZOSIN HCL 5 MG PO CAPS: 5.0000 mg | ORAL_CAPSULE | Freq: Every day | ORAL | Status: DC

## 2011-07-03 MED ORDER — AMLODIPINE BESYLATE 5 MG PO TABS: 5.0000 mg | ORAL_TABLET | Freq: Every day | ORAL | Status: DC

## 2011-07-03 NOTE — Progress Notes (Signed)
[name], female   DOB: [birth year], 66 y.o.   MRN: [id number] HPI: Scheduled return visit for this very nice woman with multiple cardiovascular risk factors and history of chest discomfort, but without known coronary or vascular disease.  Since her last visit, she has done well from a cardiac standpoint.  She has remained reasonably active, including performing her housework, without cardiopulmonary symptoms.  Over the past week or 2, she has experienced moderately severe back pain that has been limiting.  She has been treating this with a narcotic analgesic.  She denies unaccustomed activity or apparent injury.  She has not had a significant history of back problems before.  Prior to Admission medications   Medication Sig Start Date End Date Taking? Authorizing Provider  albuterol (PROVENTIL HFA;VENTOLIN HFA) 108 (90 BASE) MCG/ACT inhaler Inhale 2 puffs into the lungs every 6 (six) hours as needed.     Yes Historical Provider, MD  amLODipine (NORVASC) 5 MG tablet Take 1 tablet (5 mg total) by mouth daily. 07/03/11  Yes Gerrit Friends. Fong Mccarry, MD  atenolol (TENORMIN) 50 MG tablet Take 50 mg by mouth as directed. 2 qam and 1 qhs   Yes Historical Provider, MD  cetirizine (ZYRTEC) 10 MG tablet Take 10 mg by mouth daily.     Yes Historical Provider, MD  DULoxetine (CYMBALTA) 60 MG capsule Take 60 mg by mouth daily.     Yes Historical Provider, MD  EPINASTINE HCL OP Apply to eye.   Yes Historical Provider, MD  ergocalciferol (VITAMIN D2) 50000 UNITS capsule Take 50,000 Units by mouth once a week.     Yes Historical Provider, MD  gabapentin (NEURONTIN) 300 MG capsule Take 300 mg by mouth daily. 1 qam and 4 qhs   Yes Historical Provider, MD  HYDROcodone-acetaminophen (NORCO) 5-325 MG per tablet Take 1 tablet by mouth every 6 (six) hours as needed.  09/19/10  Yes Historical Provider, MD  losartan-hydrochlorothiazide (HYZAAR) 100-25 MG per tablet Take 1 tablet by mouth daily.     Yes Historical  Provider, MD  lovastatin (MEVACOR) 40 MG tablet Take 1 tablet (40 mg total) by mouth at bedtime. 04/15/11 04/14/12 Yes Gerrit Friends. Kaydenn Mclear, MD  methocarbamol (ROBAXIN) 500 MG tablet Take 500 mg by mouth 4 (four) times daily.     Yes Historical Provider, MD  polyethylene glycol (MIRALAX / GLYCOLAX) packet Take 17 g by mouth daily.     Yes Historical Provider, MD  pramoxine-hydrocortisone cream Apply to affected area 3 times daily 10/05/10 10/05/11 Yes Tana Coast, PA  RABEprazole (ACIPHEX) 20 MG tablet Take 40 mg by mouth 2 (two) times daily.   04/15/11 04/14/12 Yes Gerrit Friends. Anahis Furgeson, MD  terazosin (HYTRIN) 5 MG capsule Take 1 capsule (5 mg total) by mouth at bedtime. 07/03/11 07/02/12 Yes Gerrit Friends. Joslynn Jamroz, MD  zolpidem (AMBIEN) 10 MG tablet Take 1 tablet (10 mg total) by mouth daily. 06/26/11  Yes Nelly Rout, MD    Allergies  Allergen Reactions  . Codeine Hives, Nausea Only and Other (See Comments)    Feels funny  . Penicillins Hives  . Sulfonamide Derivatives Nausea Only      Past medical history, social history, and family history reviewed and updated.  ROS: Denies orthopnea, PND, palpitations, lightheadedness or syncope.  PHYSICAL EXAM: BP 144/82  Pulse 61  Resp 18  Ht 5\' 2"  (1.575 m)  Wt 92.987 kg (205 lb)  BMI 37.49 kg/m2  General-Well developed; no acute distress Body habitus-obese Neck-No JVD; no  carotid bruits Lungs-clear lung fields; resonant to percussion Cardiovascular-normal PMI; normal S1 and S2 Abdomen-normal bowel sounds; soft and non-tender without masses or organomegaly Musculoskeletal-No deformities, no cyanosis or clubbing Neurologic-Normal cranial nerves; symmetric strength and tone Skin-Warm, no significant lesions Extremities-distal pulses intact; 1+ ankle edema  ASSESSMENT AND PLAN:  Donna Bing, MD 07/03/2011 6:09 PM

## 2011-07-03 NOTE — Assessment & Plan Note (Signed)
Most recent lipid profile available to me from 05/2009 revealed adequate control of hyperlipidemia, especially in light of the absence of known vascular disease.  A repeat study will be obtained.

## 2011-07-03 NOTE — Assessment & Plan Note (Signed)
Ankle edema is mild, but patient finds it distressing.  Her dose of amlodipine will be reduced to 5 mg per day, and the dose of Hytrin advanced to maintain adequate control of hypertension.  She experienced a brief syncopal spell after her 1st Hytrin dose of 5 mg, but I doubt that will recur with a more gradual titration.  Dose will be increased to 2 mg per day for the next 2 weeks and then to 5 mg.

## 2011-07-03 NOTE — Assessment & Plan Note (Addendum)
Control is slightly suboptimal.  Medications will be adjusted as noted above.

## 2011-07-03 NOTE — Patient Instructions (Signed)
**Note De-Identified  Obfuscation** Your physician has recommended you make the following change in your medication: decrease Amlodipine to 5 mg daily, increase Hytrin to 2 mg daily until you finish current bottle then increase to 5 mg daily thereafter and you may take Aleve OTC for back pain (take 1 ti 2 tablets twice daily as needed  Your physician has requested that you regularly monitor and record your blood pressure readings at home. Please use the same machine at the same time of day to check your readings and record them to bring to your follow-up visit. Please record BP's at least for 1 week and bring to your next office visit with [place].  Call this office to report blood pressure reading that are greater than 160 systolic (top number) or 100 diastolic (bottom number)  Your physician recommends that you schedule a follow-up appointment in: 6 months

## 2011-07-03 NOTE — Assessment & Plan Note (Signed)
Chest discomfort is quiescent for now.

## 2011-07-06 LAB — LIPID PANEL
Cholesterol: 143 mg/dL (ref 0–200)
HDL: 54 mg/dL (ref >39)
LDL Cholesterol: 75 mg/dL (ref 0–99)
Total CHOL/HDL Ratio: 2.6 Ratio
Triglycerides: 68 mg/dL (ref <150)
VLDL: 14 mg/dL (ref 0–40)

## 2011-07-08 ENCOUNTER — Encounter (HOSPITAL_COMMUNITY): Payer: Self-pay | Admitting: Pharmacy Technician

## 2011-07-09 MED ORDER — SODIUM CHLORIDE 0.45 % IV SOLN
Freq: Once | INTRAVENOUS | Status: AC
  Administered 2011-07-10: 09:00:00 via INTRAVENOUS

## 2011-07-10 ENCOUNTER — Encounter (HOSPITAL_COMMUNITY)
Admission: RE | Disposition: A | Discharge status: Home Health | Payer: Self-pay | Source: Ambulatory Visit | Attending: Internal Medicine

## 2011-07-10 ENCOUNTER — Encounter (HOSPITAL_COMMUNITY): Payer: Self-pay | Admitting: *Deleted

## 2011-07-10 ENCOUNTER — Ambulatory Visit (HOSPITAL_COMMUNITY)
Admission: RE | Admit: 2011-07-10 | Discharge: 2011-07-10 | Disposition: A | Discharge status: Home Health | Payer: Medicare Other | Source: Ambulatory Visit | Attending: Internal Medicine | Admitting: Internal Medicine

## 2011-07-10 DIAGNOSIS — K5900 Constipation, unspecified: Secondary | ICD-10-CM

## 2011-07-10 DIAGNOSIS — I10 Essential (primary) hypertension: Secondary | ICD-10-CM | POA: Insufficient documentation

## 2011-07-10 DIAGNOSIS — Z860100 Personal history of colon polyps, unspecified: Secondary | ICD-10-CM | POA: Insufficient documentation

## 2011-07-10 DIAGNOSIS — K648 Other hemorrhoids: Secondary | ICD-10-CM | POA: Insufficient documentation

## 2011-07-10 DIAGNOSIS — K921 Melena: Secondary | ICD-10-CM

## 2011-07-10 DIAGNOSIS — K5909 Other constipation: Secondary | ICD-10-CM | POA: Insufficient documentation

## 2011-07-10 DIAGNOSIS — Z79899 Other long term (current) drug therapy: Secondary | ICD-10-CM | POA: Insufficient documentation

## 2011-07-10 DIAGNOSIS — E785 Hyperlipidemia, unspecified: Secondary | ICD-10-CM | POA: Insufficient documentation

## 2011-07-10 HISTORY — PX: COLONOSCOPY: SHX5424

## 2011-07-10 SURGERY — COLONOSCOPY
Anesthesia: Moderate Sedation

## 2011-07-10 MED ORDER — MEPERIDINE HCL 100 MG/ML IJ SOLN
INTRAMUSCULAR | Status: AC
  Filled 2011-07-10: qty 2

## 2011-07-10 MED ORDER — STERILE WATER FOR IRRIGATION IR SOLN
Status: DC | PRN
  Administered 2011-07-10: 10:00:00

## 2011-07-10 MED ORDER — MIDAZOLAM HCL 5 MG/5ML IJ SOLN
INTRAMUSCULAR | Status: AC
  Filled 2011-07-10: qty 10

## 2011-07-10 MED ORDER — MEPERIDINE HCL 100 MG/ML IJ SOLN
INTRAMUSCULAR | Status: DC | PRN
  Administered 2011-07-10: 25 mg via INTRAVENOUS
  Administered 2011-07-10: 50 mg via INTRAVENOUS
  Administered 2011-07-10: 25 mg via INTRAVENOUS
  Administered 2011-07-10: 50 mg via INTRAVENOUS

## 2011-07-10 MED ORDER — MIDAZOLAM HCL 5 MG/5ML IJ SOLN
INTRAMUSCULAR | Status: DC | PRN
  Administered 2011-07-10: 2 mg via INTRAVENOUS
  Administered 2011-07-10 (×2): 1 mg via INTRAVENOUS
  Administered 2011-07-10: 2 mg via INTRAVENOUS

## 2011-07-10 NOTE — H&P (Deleted)
  Colonoscopy Discharge Instructions  Read the instructions outlined below and refer to this sheet in the next few weeks. These discharge instructions provide you with general information on caring for yourself after you leave the hospital. Your doctor may also give you specific instructions. While your treatment has been planned according to the most current medical practices available, unavoidable complications occasionally occur. If you have any problems or questions after discharge, call Dr. Evangelene Vora at [phone]. ACTIVITY You may resume your regular activity, but move at a slower pace for the next 24 hours.  Take frequent rest periods for the next 24 hours.  Walking will help get rid of the air and reduce the bloated feeling in your belly (abdomen).  No driving for 24 hours (because of the medicine (anesthesia) used during the test).   Do not sign any important legal documents or operate any machinery for 24 hours (because of the anesthesia used during the test).  NUTRITION Drink plenty of fluids.  You may resume your normal diet as instructed by your doctor.  Begin with a light meal and progress to your normal diet. Heavy or fried foods are harder to digest and may make you feel sick to your stomach (nauseated).  Avoid alcoholic beverages for 24 hours or as instructed.  MEDICATIONS You may resume your normal medications unless your doctor tells you otherwise.  WHAT YOU CAN EXPECT TODAY Some feelings of bloating in the abdomen.  Passage of more gas than usual.  Spotting of blood in your stool or on the toilet paper.  IF YOU HAD POLYPS REMOVED DURING THE COLONOSCOPY: No aspirin products for 7 days or as instructed.  No alcohol for 7 days or as instructed.  Eat a soft diet for the next 24 hours.  FINDING OUT THE RESULTS OF YOUR TEST Not all test results are available during your visit. If your test results are not back during the visit, make an appointment with your caregiver to find out the  results. Do not assume everything is normal if you have not heard from your caregiver or the medical facility. It is important for you to follow up on all of your test results.  SEEK IMMEDIATE MEDICAL ATTENTION IF: You have more than a spotting of blood in your stool.  Your belly is swollen (abdominal distention).  You are nauseated or vomiting.  You have a temperature over 101.  You have abdominal pain or discomfort that is severe or gets worse throughout the day.   

## 2011-07-10 NOTE — H&P (Signed)
  I have seen & examined the patient prior to the procedure(s) today and reviewed the history and physical/consultation.  Miralax has not worked for constipation. One small episode of hematochezia since seen in the office. Otherwise, There have been no changes.  After consideration of the risks, benefits, alternatives and imponderables, the patient has consented to the procedure(s).

## 2011-07-10 NOTE — Op Note (Signed)
[facility] [address] [place], [place]  [address]  COLONOSCOPY PROCEDURE REPORT  PATIENT:  Donna, Houston  MR#:  [id number] BIRTHDATE:  July 22, 1945, 65 yrs. old  GENDER:  female ENDOSCOPIST:  R. Roetta Sessions, MD FACP [facility] REF. BY:          Dr. Felecia Shelling PROCEDURE DATE:  07/10/2011 PROCEDURE:  diagnostic colonoscopy  INDICATIONS:  constipation; hematochezia  INFORMED CONSENT:  The risks, benefits, alternatives and imponderables including but not limited to bleeding, perforation as well as the possibility of a missed lesion have been reviewed. The potential for biopsy, lesion removal, etc. have also been discussed.  Questions have been answered.  All parties agreeable. Please see the history and physical in the medical record for more information.  MEDICATIONS:  Versed 6 mg and Demerol 50 milligrams in divided doses  DESCRIPTION OF PROCEDURE:  After a digital rectal exam was performed, the EC-[id number] ([id number]) colonoscope was advanced from the anus through the rectum and colon to the area of the cecum, ileocecal valve and appendiceal orifice.  The cecum was deeply intubated.  These structures were well-seen and photographed for the record.  From the level of the cecum and ileocecal valve, the scope was slowly and cautiously withdrawn.  The mucosal surfaces were carefully surveyed utilizing scope tip deflection to facilitate fold flattening as needed.  The scope was pulled down into the rectum where a thorough examination including retroflexion was performed. <<PROCEDUREIMAGES>>  FINDINGS:adequate preparation. Anal canal hemorrhoids; otherwise normal rectum. Scattered submucosal petechiae left colon otherwise normal colonic mucosa.  THERAPEUTIC / DIAGNOSTIC MANEUVERS PERFORMED:none  COMPLICATIONS:  none  CECAL WITHDRAWAL TIME:  9 minutes  IMPRESSION:  Anal canal hemorrhoids likely the cause of hematochezia in the setting of constipation; otherwise normal rectum                     ;submucosal  petechiae in left colon of doubtful clinical significance; otherwise, normal colon  RECOMMENDATIONS:  Begin Amitiza 8 mcg once daily x5 days then increase to twice a day thereafter.  One-week course of Anusol suppositories  ______________________________ R. Roetta Sessions, MD Caleen Essex  CC:  n. eSIGNED:   R. Roetta Sessions at 07/10/2011 10:22 AM  Justine Null, [phone]

## 2011-07-16 ENCOUNTER — Encounter (HOSPITAL_COMMUNITY): Payer: Self-pay | Admitting: Internal Medicine

## 2011-07-16 ENCOUNTER — Ambulatory Visit (INDEPENDENT_AMBULATORY_CARE_PROVIDER_SITE_OTHER): Payer: Medicare Other | Admitting: Psychiatry

## 2011-07-16 DIAGNOSIS — F330 Major depressive disorder, recurrent, mild: Secondary | ICD-10-CM

## 2011-07-16 MED ORDER — DULOXETINE HCL 60 MG PO CPEP: 60.0000 mg | ORAL_CAPSULE | Freq: Every day | ORAL | Status: DC

## 2011-07-16 MED ORDER — ZOLPIDEM TARTRATE 10 MG PO TABS: 10.0000 mg | ORAL_TABLET | Freq: Every day | ORAL | Status: DC

## 2011-07-16 MED ORDER — GABAPENTIN 300 MG PO CAPS: 300.0000 mg | ORAL_CAPSULE | Freq: Two times a day (BID) | ORAL | Status: DC

## 2011-07-16 NOTE — Progress Notes (Signed)
Patient came for her followup appointment. She's been compliant with her medication. She continues to have chronic insomnia however she takes Ambien which helps some sleep. She denies any agitation anger or mood swings. She denies any crying spells. She reported no side effects of medication. On her last visit we have decreased her Neurontin to 300 mg twice a day as she's also taking significant pain medication. She is still on hydrocodone for pain.  Mental status examination Patient is casually dressed and fairly groomed. Her speech is slow but clear and coherent. She described her mood is depressed and her affect is constricted. She denies any active or passive suicidal thoughts or homicidal thoughts. She denies any auditory or visual hallucination. There no psychotic symptoms present at this time. She's alert and oriented x3. Her insight judgment impulse control is okay  Assessment Maj. depressive disorder  Plan I will continue her Cymbalta 60 mg daily Neurontin 300 mg twice a day and Ambien 10 mg at bedtime. I have explained risks and benefits of medication especially taking Ambien pain medication that may cause excessive sedation and confusion. However patient is not taking her Ambien with hydrocodone. She is not abusing or asking early refill of her Ambien. She denies any using drugs or alcohol. I will see her again in 2 months

## 2011-08-14 ENCOUNTER — Encounter: Payer: Self-pay | Admitting: Gastroenterology

## 2011-08-14 ENCOUNTER — Ambulatory Visit (INDEPENDENT_AMBULATORY_CARE_PROVIDER_SITE_OTHER): Payer: Medicare Other | Admitting: Gastroenterology

## 2011-08-14 DIAGNOSIS — K5900 Constipation, unspecified: Secondary | ICD-10-CM

## 2011-08-14 DIAGNOSIS — K219 Gastro-esophageal reflux disease without esophagitis: Secondary | ICD-10-CM

## 2011-08-14 MED ORDER — LUBIPROSTONE 24 MCG PO CAPS: 24.0000 ug | ORAL_CAPSULE | Freq: Two times a day (BID) | ORAL | Status: AC

## 2011-08-14 NOTE — Patient Instructions (Signed)
Continue taking Aciphex. Have your pharmacy contact our office if an authorization is needed.   Please review the reflux diet. Try to exercise most days of the week for at least 30 minutes.   Start taking Amitiza 24 mcg twice a day with meals. If you have any diarrhea, cut back to once per day. Please call us if this does not seem to help. Continue to follow a high fiber diet.   Avoid lifting heavy objects. Monitor the belly button discomfort. If you notice this worsening despite avoiding heavy lifting/pushing/pulling, please call us. The CT did not show a hernia, but I will review this with the radiologist.   We will see you back in 3 months!

## 2011-08-14 NOTE — Progress Notes (Signed)
Referring Provider: Avon Gully, MD Primary Care Physician:  Avon Gully, MD, MD Primary Gastroenterologist: Dr. Jena Gauss   Chief Complaint  Patient presents with  . Follow-up    HPI:   Ms. Schmader presents for a follow-up after TCS done 06/2011 with Dr. Jena Gauss. Anal canal hemorrhoids noted, otherwise unremarkable. Started on amitiza BID. BM one time last week, little hard balls. Week before, none. States was having back pain. +discomfort. No nausea. No rectal bleeding. Trying to follow a high fiber diet.  GERD, on Aciphex. Up 1 lb.   States belly button "sunken in". Feels like it is pulling, worse with lifting/movement. No protrusions per pt, no significant pain.   Past Medical History  Diagnosis Date  . Hypertension   . Hyperlipidemia   . Gastroesophageal reflux disease   . Diverticulosis   . Anxiety and depression   . Adenomatous colon polyp 2006    excised in 2006 & 2010Due surveillance 06/2013  . Chronic back pain   . Thyroid nodule   . Fibromyalgia   . Migraines   . Insomnia   . Chronic constipation   . Asthma   . History of benign esophageal tumor 2010    granular cell esophageal tumor (Dx 06/2008), resected via EMR 2011, due repeat EGD 02/2012  . Chest discomfort   . Hiatal hernia     Past Surgical History  Procedure Date  . Tonsillectomy   . Abdominal hysterectomy   . Total knee arthroplasty 2002    Right; previous arthroscopic surgery  . Shoulder arthroscopy     Right; bone spurs removed  . Lung biopsy     negative  . 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 Right oophorectomy     benign disease  . Colonoscopy 06/2008, 06/2011    sigmoid tics, tubular adenoma; 2013: anal canal hemorrhoids  . Eus 08/2010    [facility] with EGD. Retained food. No recurrent esophageal lesion, bx negative.  . Colonoscopy 07/10/2011    Procedure: COLONOSCOPY;  Surgeon: Corbin Ade, MD;  Location: [facility];  Service: Endoscopy;   Laterality: N/A;  9:45    Current Outpatient Prescriptions  Medication Sig Dispense Refill  . albuterol (PROVENTIL HFA;VENTOLIN HFA) 108 (90 BASE) MCG/ACT inhaler Inhale 2 puffs into the lungs every 6 (six) hours as needed. For shortness of breath      . amLODipine (NORVASC) 5 MG tablet Take 1 tablet (5 mg total) by mouth daily.  30 tablet  6  . atenolol (TENORMIN) 50 MG tablet Take 50-100 mg by mouth 2 (two) times daily. 50 mg in the morning and a 100 at night      . cetirizine (ZYRTEC) 10 MG tablet Take 10 mg by mouth daily.        . DULoxetine (CYMBALTA) 60 MG capsule Take 1 capsule (60 mg total) by mouth daily.  30 capsule  1  . EPINASTINE HCL OP Place 1 drop into both eyes 2 (two) times daily.       . ergocalciferol (VITAMIN D2) 50000 UNITS capsule Take 50,000 Units by mouth once a week.        . gabapentin (NEURONTIN) 300 MG capsule Take 1 capsule (300 mg total) by mouth 2 (two) times daily.  60 capsule  0  . HYDROcodone-acetaminophen (NORCO) 5-325 MG per tablet Take 1 tablet by mouth every 6 (six) hours as needed. For pain      . losartan-hydrochlorothiazide (HYZAAR) 100-25 MG per  tablet Take 1 tablet by mouth daily.        Marland Kitchen lovastatin (MEVACOR) 40 MG tablet Take 1 tablet (40 mg total) by mouth at bedtime.  30 tablet  12  . methocarbamol (ROBAXIN) 500 MG tablet Take 500 mg by mouth 4 (four) times daily as needed. Muscle spasms      . polyethylene glycol (MIRALAX / GLYCOLAX) packet Take 17 g by mouth daily.        . RABEprazole (ACIPHEX) 20 MG tablet Take 40 mg by mouth 2 (two) times daily.        Marland Kitchen terazosin (HYTRIN) 5 MG capsule Take 1 capsule (5 mg total) by mouth at bedtime.  30 capsule  6  . zolpidem (AMBIEN) 10 MG tablet Take 1 tablet (10 mg total) by mouth daily.  30 tablet  1  . lubiprostone (AMITIZA) 24 MCG capsule Take 1 capsule (24 mcg total) by mouth 2 (two) times daily with a meal.  60 capsule  1    Allergies as of 08/14/2011 - Review Complete 08/14/2011  Allergen  Reaction Noted  . Codeine Hives, Nausea Only, and Other (See Comments)   . Penicillins Hives   . Sulfonamide derivatives Nausea Only     Family History  Problem Relation Age of Onset  . Stroke Father   . Heart attack Mother   . Colon cancer Paternal Aunt   . Colon cancer Paternal Uncle     History   Social History  . Marital Status: Single    Spouse Name: N/A    Number of Children: N/A  . Years of Education: N/A   Social History Main Topics  . Smoking status: Never Smoker   . Smokeless tobacco: Never Used  . Alcohol Use: No  . Drug Use: No  . Sexually Active: No   Other Topics Concern  . None   Social History Narrative  . None    Review of Systems: Gen: Denies fever, chills, anorexia. Denies fatigue, weakness, weight loss.  CV: Denies chest pain, palpitations, syncope, peripheral edema, and claudication. Resp: Denies dyspnea at rest, cough, wheezing, coughing up blood, and pleurisy. GI: Denies vomiting blood, jaundice, and fecal incontinence.   Denies dysphagia or odynophagia. Derm: Denies rash, itching, dry skin Psych: Denies depression, anxiety, memory loss, confusion. No homicidal or suicidal ideation.  Heme: Denies bruising, bleeding, and enlarged lymph nodes.  Physical Exam: BP 122/73  Pulse 58  Temp(Src) 97.2 F (36.2 C) (Temporal)  Ht 5\' 3"  (1.6 m)  Wt 209 lb 9.6 oz (95.074 kg)  BMI 37.13 kg/m2 General:   Alert and oriented. No distress noted. Pleasant and cooperative.  Head:  Normocephalic and atraumatic. Eyes:  Conjuctiva clear without scleral icterus. Mouth:  Oral mucosa pink and moist. Good dentition. No lesions. Neck:  Supple, without mass or thyromegaly. Heart:  S1, S2 present without murmurs, rubs, or gallops. Regular rate and rhythm. Abdomen:  +BS, soft, non-tender and non-distended. No rebound or guarding. No HSM noted. ?umbilical hernia Msk:  Symmetrical without gross deformities. Normal posture. Extremities:  Without edema. Neurologic:   Alert and  oriented x4;  grossly normal neurologically. Skin:  Intact without significant lesions or rashes. Cervical Nodes:  No significant cervical adenopathy. Psych:  Alert and cooperative. Normal mood and affect.

## 2011-08-18 ENCOUNTER — Encounter: Payer: Self-pay | Admitting: Gastroenterology

## 2011-08-18 DIAGNOSIS — K5900 Constipation, unspecified: Secondary | ICD-10-CM | POA: Insufficient documentation

## 2011-08-18 NOTE — Assessment & Plan Note (Signed)
Rectal bleeding resolved, likely hemorrhoids cause of trivial bleeding in past. Started on Amitiza 8 mcg BID, continued constipation. No nausea or other issues. Will increase to 24 mcg BID.   As of note, reports "pulling sensation" in belly button, states "sinking in more". I do not appreciate an outright umbilical hernia on exam. Sensation worsened with lifting, exertion. Will monitor, most recent CT without mention of hernia. Will review with radiologist. Pt to call if worsening, avoidance of heavy lifting.   Follow-up in 3 mos

## 2011-08-18 NOTE — Assessment & Plan Note (Signed)
Controlled with Aciphex. Up 1 lb. Continue Aciphex, follow GERD diet. Follow-up in 3 mos.

## 2011-08-19 NOTE — Progress Notes (Signed)
Faxed to PCP

## 2011-09-10 ENCOUNTER — Encounter (HOSPITAL_COMMUNITY): Payer: Self-pay | Admitting: Psychiatry

## 2011-09-10 ENCOUNTER — Ambulatory Visit (HOSPITAL_COMMUNITY): Payer: Medicare Other | Admitting: Psychiatry

## 2011-09-10 ENCOUNTER — Ambulatory Visit (INDEPENDENT_AMBULATORY_CARE_PROVIDER_SITE_OTHER): Payer: Medicare Other | Admitting: Psychiatry

## 2011-09-10 DIAGNOSIS — F330 Major depressive disorder, recurrent, mild: Secondary | ICD-10-CM

## 2011-09-10 MED ORDER — GABAPENTIN 300 MG PO CAPS: 300.0000 mg | ORAL_CAPSULE | Freq: Two times a day (BID) | ORAL | Status: DC

## 2011-09-10 MED ORDER — ZOLPIDEM TARTRATE 10 MG PO TABS: 10.0000 mg | ORAL_TABLET | Freq: Every day | ORAL | Status: DC

## 2011-09-10 MED ORDER — DULOXETINE HCL 60 MG PO CPEP: 60.0000 mg | ORAL_CAPSULE | Freq: Every day | ORAL | Status: DC

## 2011-09-10 NOTE — Progress Notes (Signed)
Chief complaint Medication management  History of presenting illness Patient is 66 year old [place] American female who came for her followup appointment. She's been compliant with her medication and reported no side effects. She continues to have residual pain and does not feel she has been given enough pain medication from her primary care physician and pain Dr. She has some insomnia which is due to pain but overall she is less depressed and less isolated. She likes Cymbalta. She denies any agitation anger or severe mood swings. She denies any crying spells however sometimes she becomes very isolated and withdrawn due to significant pain. She denies any tremors or shakes.  Current psychiatric medication Neurontin 300 mg twice a day Cymbalta 60 mg daily Ambien 10 mg at bedtime.  Medical history Patient has history of hypertension, hyperlipidemia, GERD, diverticulosis, colonic polyp, chronic back pain, fibromyalgia, chronic constipation, benign tumor of esophagus and chronic headache.  Mental status examination Patient is casually dressed and fairly groomed. She is calm cooperative and maintained fair eye contact. She described her mood is anxious and sad and her affect is constricted. Her speech is slow slow but clear and coherent. She denies any active or passive suicidal thoughts or homicidal thoughts. She denies any auditory or visual hallucination. There no psychotic symptoms present at this time. She's alert and oriented x3. Her insight judgment impulse control is okay  Assessment Axis I Maj. depressive disorder Axis II deferred Axis III see medical history Axis IV are to moderate.  Plan I reviewed medication, history and psychosocial stressors. At this time her depression is fairly stable on Cymbalta Neurontin. She is taking Ambien which is working very well for her insomnia. I have explained risks and benefits of medication in detail. I recommend to see her primary care doctor to  adjust her pain medication. She is not abusing her Ambien and taking hydrocodone once a day. She denies drinking alcohol or using any illegal substances. I recommended to call if she has any question or concern about the medication or if she feels worsening of the symptoms. I will see her again in 2 months.

## 2011-09-30 ENCOUNTER — Other Ambulatory Visit (HOSPITAL_COMMUNITY): Payer: Self-pay | Admitting: Internal Medicine

## 2011-09-30 DIAGNOSIS — Z139 Encounter for screening, unspecified: Secondary | ICD-10-CM

## 2011-11-11 ENCOUNTER — Ambulatory Visit: Payer: Medicare Other | Admitting: Gastroenterology

## 2011-11-11 ENCOUNTER — Ambulatory Visit (HOSPITAL_COMMUNITY)
Admission: RE | Admit: 2011-11-11 | Discharge: 2011-11-11 | Disposition: A | Discharge status: Home / Self Care | Payer: Medicare Other | Source: Ambulatory Visit | Attending: Internal Medicine | Admitting: Internal Medicine

## 2011-11-11 ENCOUNTER — Ambulatory Visit: Payer: Medicare Other | Admitting: Urgent Care

## 2011-11-11 DIAGNOSIS — Z139 Encounter for screening, unspecified: Secondary | ICD-10-CM

## 2011-11-11 DIAGNOSIS — Z1231 Encounter for screening mammogram for malignant neoplasm of breast: Secondary | ICD-10-CM | POA: Insufficient documentation

## 2011-11-12 ENCOUNTER — Ambulatory Visit (HOSPITAL_COMMUNITY): Payer: Self-pay | Admitting: Psychiatry

## 2011-11-20 ENCOUNTER — Encounter: Payer: Self-pay | Admitting: Internal Medicine

## 2011-11-21 ENCOUNTER — Ambulatory Visit (INDEPENDENT_AMBULATORY_CARE_PROVIDER_SITE_OTHER): Payer: Medicare Other | Admitting: Urgent Care

## 2011-11-21 ENCOUNTER — Encounter: Payer: Self-pay | Admitting: Urgent Care

## 2011-11-21 VITALS — BP 110/70 | HR 75 | Temp 98.6°F | Ht 62.0 in | Wt 203.2 lb

## 2011-11-21 DIAGNOSIS — Z8719 Personal history of other diseases of the digestive system: Secondary | ICD-10-CM

## 2011-11-21 DIAGNOSIS — Z86018 Personal history of other benign neoplasm: Secondary | ICD-10-CM

## 2011-11-21 DIAGNOSIS — K5900 Constipation, unspecified: Secondary | ICD-10-CM

## 2011-11-21 DIAGNOSIS — K219 Gastro-esophageal reflux disease without esophagitis: Secondary | ICD-10-CM

## 2011-11-21 DIAGNOSIS — D126 Benign neoplasm of colon, unspecified: Secondary | ICD-10-CM

## 2011-11-21 MED ORDER — LUBIPROSTONE 24 MCG PO CAPS: 24.0000 ug | ORAL_CAPSULE | Freq: Two times a day (BID) | ORAL | Status: DC

## 2011-11-21 MED ORDER — RABEPRAZOLE SODIUM 20 MG PO TBEC: 20.0000 mg | DELAYED_RELEASE_TABLET | Freq: Two times a day (BID) | ORAL | Status: DC

## 2011-11-21 NOTE — Assessment & Plan Note (Addendum)
Refractory GERD despite AcipHex 20 mg twice a day. Multiple PPI failure. History of granular cell tumor of esophagus 2010. EUS benign last year by Dr. Margaretha Glassing.  EGD with possible Bravo pH probe placement by Dr. Jena Gauss in the near future on PPI.  I have discussed risks & benefits which include, but are not limited to, bleeding, infection, perforation & drug reaction.  The patient agrees with this plan & written consent will be obtained.    Phenergan 12.5 milligrams IV will be given 30 minutes prior to procedure to augment sedation given multiple psychoactive medications.

## 2011-11-21 NOTE — Progress Notes (Signed)
Referring Provider: Fanta, Tesfaye, MD Primary Care Physician:  FANTA,TESFAYE, MD, MD Primary Gastroenterologist:  Dr. Rourk  Chief Complaint  Patient presents with  . Follow-up    GERD, constipation, history clinical granular cell tumor esophagus    HPI:  [name] is a 65 y.o. female here for follow up for GERD and constipation. 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  EUS by Dr. Conway 08/28/10 without evidence of tumor. History of Chronic constipation.  Taking Amitiza 24mcg BID.  Works well w/ BM 4 times per week.  Denies rectal bleeding or melena.  C/o burning in chest & up esophagus every day.  Awakens in AM w/ symptoms.  Takes Aciphex 20mg @10am & 6pm.  Last meal 6pm.  Goes to bed 11:30pm.  No late night snacks, but does admit to sleepwalking and eating w/ ambien.  Weight stable.  Appetite ok.  C/o choking w/ pills & meat.  Denies dysphagia or odynophagia.  Abdominal ultrasound, HIDA, GES, CT negative last year.  C/o intermittent mild umbilicus pain, worse with lifting.  Past Medical History  Diagnosis Date  . Hypertension   . Hyperlipidemia   . Gastroesophageal reflux disease   . Diverticulosis   . Anxiety and depression   . Adenomatous colon polyp 2006    excised in 2006 & 2010Due surveillance 06/2013  . Chronic back pain   . Thyroid nodule   . Fibromyalgia   . Migraines   . Insomnia   . Chronic constipation   . Asthma   . History of benign esophageal tumor 2010    granular cell esophageal tumor (Dx 06/2008), resected via EMR 2011, due repeat EGD 02/2012  . Chest discomfort   . Hiatal hernia     Past Surgical History  Procedure Date  . Tonsillectomy   . Abdominal hysterectomy   . Total knee arthroplasty 2002    Right; previous arthroscopic surgery  . Shoulder arthroscopy     Right; bone spurs removed  . Lung biopsy     negative  . 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 Right  oophorectomy     benign disease  . Colonoscopy 06/2008, 06/2011    sigmoid tics, tubular adenoma; 2013: anal canal hemorrhoids  . Eus 08/2010    Dr Conway-[facility] with EGD. Retained food. No recurrent esophageal lesion, bx negative.  . Colonoscopy 07/10/2011    Anal canal hemorrhoids likely the cause of hematochezia in the setting of constipation; otherwise normal rectum ;submucosal  petechiae in left colon of doubtful clinical significance; otherwise, normal colon    Current Outpatient Prescriptions  Medication Sig Dispense Refill  . albuterol (PROVENTIL HFA;VENTOLIN HFA) 108 (90 BASE) MCG/ACT inhaler Inhale 2 puffs into the lungs every 6 (six) hours as needed. For shortness of breath      . amLODipine (NORVASC) 5 MG tablet Take 1 tablet (5 mg total) by mouth daily.  30 tablet  6  . atenolol (TENORMIN) 50 MG tablet Take 50-100 mg by mouth 2 (two) times daily. 50 mg in the morning and a 100 at night      . DULoxetine (CYMBALTA) 60 MG capsule Take 1 capsule (60 mg total) by mouth daily.  30 capsule  1  . ergocalciferol (VITAMIN D2) 50000 UNITS capsule Take 50,000 Units by mouth once a week.        . gabapentin (NEURONTIN) 300 MG capsule Take 1 capsule (300 mg total)   by mouth 2 (two) times daily.  60 capsule  1  . losartan-hydrochlorothiazide (HYZAAR) 100-25 MG per tablet Take 1 tablet by mouth daily.        . lovastatin (MEVACOR) 40 MG tablet Take 1 tablet (40 mg total) by mouth at bedtime.  30 tablet  12  . lubiprostone (AMITIZA) 24 MCG capsule Take 1 capsule (24 mcg total) by mouth 2 (two) times daily with a meal.  60 capsule  5  . RABEprazole (ACIPHEX) 20 MG tablet Take 1 tablet (20 mg total) by mouth 2 (two) times daily.  60 tablet  11  . terazosin (HYTRIN) 5 MG capsule Take 1 capsule (5 mg total) by mouth at bedtime.  30 capsule  6  . traMADol (ULTRAM) 50 MG tablet Take 50 mg by mouth every 6 (six) hours as needed.      . zolpidem (AMBIEN) 10 MG tablet Take 1 tablet (10 mg total) by mouth daily.   30 tablet  1  . DISCONTD: RABEprazole (ACIPHEX) 20 MG tablet Take 40 mg by mouth 2 (two) times daily.        . DISCONTD: RABEprazole (ACIPHEX) 20 MG tablet Take 1 tablet (20 mg total) by mouth 2 (two) times daily.  60 tablet  12    Allergies as of 11/21/2011 - Review Complete 11/21/2011  Allergen Reaction Noted  . Codeine Hives, Nausea Only, and Other (See Comments)   . Latex Hives 11/21/2011  . Penicillins Hives   . Sulfonamide derivatives Nausea Only     Review of Systems: Gen: Denies any fever, chills, sweats, anorexia, fatigue, weakness, malaise, weight loss. History of insomnia and sleepwalking CV: Denies chest pain, angina, palpitations, syncope, orthopnea, PND,and claudication. Complaining of bilateral lower extremity edema. Resp: Denies dyspnea at rest, dyspnea with exercise, cough, sputum, wheezing, coughing up blood, and pleurisy. GI: Denies vomiting blood, jaundice, and fecal incontinence.    Derm: Denies rash, itching, dry skin, hives, moles, warts, or unhealing ulcers.  Psych: Denies depression, anxiety, memory loss, suicidal ideation, hallucinations, paranoia, and confusion. Heme: Denies bruising, bleeding, and enlarged lymph nodes.  Physical Exam: BP 110/70  Pulse 75  Temp(Src) 98.6 F (37 C) (Temporal)  Ht 5' 2" (1.575 m)  Wt 203 lb 3.2 oz (92.171 kg)  BMI 37.17 kg/m2 General:   Alert,  Well-developed, well-nourished, pleasant and cooperative in NAD Eyes:  Sclera clear, no icterus.   Conjunctiva pink. Mouth:  No deformity or lesions, oropharynx pink and moist. Neck:  Supple; no masses or thyromegaly. Heart:  Regular rate and rhythm; no murmurs, clicks, rubs,  or gallops. Abdomen:  Normal bowel sounds.  No bruits.  Soft, non-tender and non-distended without masses, hepatosplenomegaly or hernias noted.  No guarding or rebound tenderness.   Rectal:  Deferred. Msk:  Symmetrical without gross deformities.  Pulses:  Normal pulses noted. Extremities:  No clubbing or  edema. Neurologic:  Alert and oriented x4;  grossly normal neurologically. Skin:  Intact without significant lesions or rashes.   

## 2011-11-21 NOTE — Assessment & Plan Note (Signed)
Much improved with Amitiza 24 mcg twice a day.

## 2011-11-21 NOTE — Patient Instructions (Signed)
Continue Amitiza 24 mcg with food twice daily You will need an EGD (upper endoscopy) with Dr. Jena Gauss and he may decide to place a pH probe at that time Continue AcipHex 20 mg before breakfast and dinner Next colonoscopy January 2015

## 2011-11-21 NOTE — Progress Notes (Signed)
Faxed to PCP

## 2011-11-21 NOTE — Assessment & Plan Note (Signed)
Scheduled for EGD as above

## 2011-11-26 ENCOUNTER — Other Ambulatory Visit (HOSPITAL_COMMUNITY): Payer: Self-pay | Admitting: *Deleted

## 2011-11-26 ENCOUNTER — Other Ambulatory Visit (HOSPITAL_COMMUNITY): Payer: Self-pay | Admitting: Psychiatry

## 2011-11-26 DIAGNOSIS — F330 Major depressive disorder, recurrent, mild: Secondary | ICD-10-CM

## 2011-11-26 MED ORDER — ZOLPIDEM TARTRATE 10 MG PO TABS: 10.0000 mg | ORAL_TABLET | Freq: Every day | ORAL | Status: DC

## 2011-11-29 ENCOUNTER — Other Ambulatory Visit (HOSPITAL_COMMUNITY): Payer: Self-pay | Admitting: Psychiatry

## 2011-11-29 ENCOUNTER — Other Ambulatory Visit (HOSPITAL_COMMUNITY): Payer: Self-pay | Admitting: *Deleted

## 2011-11-29 DIAGNOSIS — F330 Major depressive disorder, recurrent, mild: Secondary | ICD-10-CM

## 2011-11-29 MED ORDER — DULOXETINE HCL 60 MG PO CPEP: 60.0000 mg | ORAL_CAPSULE | Freq: Every day | ORAL | Status: DC

## 2011-12-04 ENCOUNTER — Encounter (HOSPITAL_COMMUNITY): Payer: Self-pay | Admitting: Pharmacy Technician

## 2011-12-05 ENCOUNTER — Encounter (HOSPITAL_COMMUNITY): Payer: Self-pay | Admitting: Psychiatry

## 2011-12-05 ENCOUNTER — Ambulatory Visit (INDEPENDENT_AMBULATORY_CARE_PROVIDER_SITE_OTHER): Payer: Medicare Other | Admitting: Psychiatry

## 2011-12-05 DIAGNOSIS — F329 Major depressive disorder, single episode, unspecified: Secondary | ICD-10-CM

## 2011-12-05 DIAGNOSIS — F330 Major depressive disorder, recurrent, mild: Secondary | ICD-10-CM

## 2011-12-05 MED ORDER — DULOXETINE HCL 60 MG PO CPEP: 60.0000 mg | ORAL_CAPSULE | Freq: Every day | ORAL | Status: DC

## 2011-12-05 NOTE — Progress Notes (Signed)
Chief complaint Medication management and followup.  History of presenting illness Patient is 66 year old [place] American female who came for her followup appointment. She's been compliant with her medication and reported no side effects. She continues to have residual pain and recently she has been given tramadol .  Her hydrocodone has been stopped by her primary care physician.  She is taking Neurontin 400 mg 3 times a day.  Overall she has been stable on her medication except for chronic pain.  She sleeping better.  She denies any agitation anger mood swing.  She denies any paranoia or any hallucination.  She still has difficulty leaving her house due to pain.  She admitted being isolated and withdrawn but denies any violence anger or feeling of hopelessness.  She's not drinking or using any illegal substance.  Current psychiatric medication Neurontin 300 mg 3 times a day prescribed by Dr. Harrel Carina.  Cymbalta 60 mg daily Ambien 10 mg at bedtime.  Medical history Patient has history of hypertension, hyperlipidemia, GERD, diverticulosis, colonic polyp, chronic back pain, fibromyalgia, chronic constipation, benign tumor of esophagus and chronic headache.  Her primary care physician is Dr. Harrel Carina.  Mental status examination Patient is casually dressed and fairly groomed. She is calm cooperative and maintained fair eye contact. She described her mood is tired and her affect is mood congruent.  Her speech is nonspontaneous and slow slow.  Her thought processes clear and logical. She denies any active or passive suicidal thoughts or homicidal thoughts. She denies any auditory or visual hallucination. There no psychotic symptoms present at this time. She's alert and oriented x3. Her insight judgment impulse control is okay  Assessment Axis I Maj. depressive disorder Axis II deferred Axis III see medical history Axis IV are to moderate.  Plan At this time her depression is fairly stable on Cymbalta.   She sleeping better with Ambien.  She is taking Neurontin from her primary care physician.  At this time patient does not have any side effects of medication.  She's not drinking or using any illegal substance.  I will continue her current psychiatric medication.  I recommend to call us if she is a question or concern about the medication or if she feel worsening of the symptoms.  I will see her again in 2 months.  Portion of this note is generated with voice recognition software and may contain typographical better.

## 2011-12-11 ENCOUNTER — Encounter (HOSPITAL_COMMUNITY): Payer: Self-pay | Admitting: *Deleted

## 2011-12-11 ENCOUNTER — Ambulatory Visit (HOSPITAL_COMMUNITY)
Admission: RE | Admit: 2011-12-11 | Discharge: 2011-12-11 | Disposition: A | Discharge status: Home / Self Care | Payer: Medicare Other | Source: Ambulatory Visit | Attending: Internal Medicine | Admitting: Internal Medicine

## 2011-12-11 ENCOUNTER — Encounter (HOSPITAL_COMMUNITY)
Admission: RE | Disposition: A | Discharge status: Home / Self Care | Payer: Self-pay | Source: Ambulatory Visit | Attending: Internal Medicine

## 2011-12-11 DIAGNOSIS — K449 Diaphragmatic hernia without obstruction or gangrene: Secondary | ICD-10-CM | POA: Insufficient documentation

## 2011-12-11 DIAGNOSIS — E785 Hyperlipidemia, unspecified: Secondary | ICD-10-CM | POA: Insufficient documentation

## 2011-12-11 DIAGNOSIS — K319 Disease of stomach and duodenum, unspecified: Secondary | ICD-10-CM | POA: Insufficient documentation

## 2011-12-11 DIAGNOSIS — K2960 Other gastritis without bleeding: Secondary | ICD-10-CM

## 2011-12-11 DIAGNOSIS — Z79899 Other long term (current) drug therapy: Secondary | ICD-10-CM | POA: Insufficient documentation

## 2011-12-11 DIAGNOSIS — K219 Gastro-esophageal reflux disease without esophagitis: Secondary | ICD-10-CM | POA: Insufficient documentation

## 2011-12-11 DIAGNOSIS — I10 Essential (primary) hypertension: Secondary | ICD-10-CM | POA: Insufficient documentation

## 2011-12-11 DIAGNOSIS — Z86018 Personal history of other benign neoplasm: Secondary | ICD-10-CM

## 2011-12-11 DIAGNOSIS — K222 Esophageal obstruction: Secondary | ICD-10-CM | POA: Insufficient documentation

## 2011-12-11 HISTORY — PX: BRAVO PH STUDY: SHX5421

## 2011-12-11 HISTORY — PX: ESOPHAGOGASTRODUODENOSCOPY: SHX5428

## 2011-12-11 SURGERY — EGD (ESOPHAGOGASTRODUODENOSCOPY)
Anesthesia: Moderate Sedation

## 2011-12-11 MED ORDER — PROMETHAZINE HCL 25 MG/ML IJ SOLN
12.5000 mg | Freq: Once | INTRAMUSCULAR | Status: AC
  Administered 2011-12-11: 12.5 mg via INTRAVENOUS

## 2011-12-11 MED ORDER — MIDAZOLAM HCL 5 MG/5ML IJ SOLN
INTRAMUSCULAR | Status: AC
  Filled 2011-12-11: qty 10

## 2011-12-11 MED ORDER — MEPERIDINE HCL 100 MG/ML IJ SOLN
INTRAMUSCULAR | Status: AC
  Filled 2011-12-11: qty 1

## 2011-12-11 MED ORDER — MEPERIDINE HCL 100 MG/ML IJ SOLN
INTRAMUSCULAR | Status: DC | PRN
  Administered 2011-12-11: 50 mg via INTRAVENOUS
  Administered 2011-12-11: 25 mg via INTRAVENOUS

## 2011-12-11 MED ORDER — SODIUM CHLORIDE 0.45 % IV SOLN
Freq: Once | INTRAVENOUS | Status: AC
  Administered 2011-12-11: 11:00:00 via INTRAVENOUS

## 2011-12-11 MED ORDER — SODIUM CHLORIDE 0.9 % IJ SOLN
INTRAMUSCULAR | Status: AC
  Filled 2011-12-11: qty 10

## 2011-12-11 MED ORDER — MIDAZOLAM HCL 5 MG/5ML IJ SOLN
INTRAMUSCULAR | Status: DC | PRN
  Administered 2011-12-11: 1 mg via INTRAVENOUS
  Administered 2011-12-11: 2 mg via INTRAVENOUS

## 2011-12-11 MED ORDER — BUTAMBEN-TETRACAINE-BENZOCAINE 2-2-14 % EX AERO
INHALATION_SPRAY | CUTANEOUS | Status: DC | PRN
  Administered 2011-12-11: 2 via TOPICAL

## 2011-12-11 MED ORDER — PROMETHAZINE HCL 25 MG/ML IJ SOLN
INTRAMUSCULAR | Status: AC
  Filled 2011-12-11: qty 1

## 2011-12-11 NOTE — Plan of Care (Signed)
Please schedule pH/impedance study at [facility]. Patient is to remain on twice daily AcipHex for the procedure. I would like her to hold the Carafate during the time of the procedure, however.

## 2011-12-11 NOTE — Discharge Instructions (Signed)
EGD Discharge instructions Please read the instructions outlined below and refer to this sheet in the next few weeks. These discharge instructions provide you with general information on caring for yourself after you leave the hospital. Your doctor may also give you specific instructions. While your treatment has been planned according to the most current medical practices available, unavoidable complications occasionally occur. If you have any problems or questions after discharge, please call your doctor. ACTIVITY  You may resume your regular activity but move at a slower pace for the next 24 hours.   Take frequent rest periods for the next 24 hours.   Walking will help expel (get rid of) the air and reduce the bloated feeling in your abdomen.   No driving for 24 hours (because of the anesthesia (medicine) used during the test).   You may shower.   Do not sign any important legal documents or operate any machinery for 24 hours (because of the anesthesia used during the test).  NUTRITION  Drink plenty of fluids.   You may resume your normal diet.   Begin with a light meal and progress to your normal diet.   Avoid alcoholic beverages for 24 hours or as instructed by your caregiver.  MEDICATIONS  You may resume your normal medications unless your caregiver tells you otherwise.  WHAT YOU CAN EXPECT TODAY  You may experience abdominal discomfort such as a feeling of fullness or "gas" pains.  FOLLOW-UP  Your doctor will discuss the results of your test with you.  SEEK IMMEDIATE MEDICAL ATTENTION IF ANY OF THE FOLLOWING OCCUR:  Excessive nausea (feeling sick to your stomach) and/or vomiting.   Severe abdominal pain and distention (swelling).   Trouble swallowing.   Temperature over 101 F (37.8 C).   Rectal bleeding or vomiting of blood.    Continue AcipHex twice daily.  Add Carafate 1 g suspension 4 times daily to the regimen.  My office will schedule a pH/ Impedence  study [facility] in the near future.  Further recommendations to follow pending review of pathology report.

## 2011-12-11 NOTE — Op Note (Signed)
[facility] [address] [place], [place]  [address]  ENDOSCOPY PROCEDURE REPORT  PATIENT:  [name], [name]  MR#:  [id number] BIRTHDATE:  May 04, 1946, 65 yrs. old  GENDER:  female  ENDOSCOPIST:  R. Roetta Sessions, MD Caleen Essex Referred by:  Glenice Laine, M.D.  PROCEDURE DATE:  12/11/2011 PROCEDURE:  EGD with gastric biopsy  INDICATIONS:   refractory reflux symptoms in spite of taking AcipHex 20 mg orally twice a day. Patient denies dysphagia. History of esophageal granular cell tumor status post EMR previously  INFORMED CONSENT:   The risks, benefits, limitations, alternatives and imponderables have been discussed.  The potential for biopsy, esophogeal dilation, etc. have also been reviewed.  Questions have been answered.  All parties agreeable.  Please see the history and physical in the medical record for more information.  MEDICATIONS:   Phenergan 12.5 mg IV and Versed 3 mg IV Demerol 75 mg IV in divided this. Cetacaine spray.  DESCRIPTION OF PROCEDURE:   The EG-[id number] ([id number]) endoscope was introduced through the mouth and advanced to the second portion of the duodenum without difficulty or limitations.  The mucosal surfaces were surveyed very carefully during advancement of the scope and upon withdrawal.  Retroflexion view of the proximal stomach and esophagogastric junction was performed.  <<PROCEDUREIMAGES>>  FINDINGS:  Noncritical, widely patent Schatzki's ring. No esophagitis. Stomach empty. Small hiatal hernia. Antral erosions extending     into the bulb; otherwise normal-appearing gastric mucosa and duodenal mucosa through the second portion.  THERAPEUTIC / DIAGNOSTIC MANEUVERS PERFORMED:  Biopsies the abnormal antral mucosa taken.  COMPLICATIONS:   None  IMPRESSION:             Noncritical Schatzki's ring; otherwise normal patent tubular esophagus. Small hiatal hernia. Antral and bulbar erosions-              status post gastric  biopsy.  RECOMMENDATIONS:   Add Carafate suspension 1 g 4 times a day to her regimen. Plan to proceed with pH/impedance study while on acid suppression therapy.  Etiology of her symptoms could be related to NERD or non-acidic acid reflux. Inadequately         suppressed acid reflux less likely.  ______________________________ R. Roetta Sessions, MD Caleen Essex  CC:  n. eSIGNED:   R. Casimiro Needle Massimiliano Rohleder at 12/11/2011 12:00 PM  Justine Null, [phone]

## 2011-12-11 NOTE — Interval H&P Note (Signed)
History and Physical Interval Note:  12/11/2011 11:30 AM  [name]  has presented today for surgery, with the diagnosis of History of benign esophageal tumor  The various methods of treatment have been discussed with the patient and family. After consideration of risks, benefits and other options for treatment, the patient has consented to  Procedure(s) (LRB): ESOPHAGOGASTRODUODENOSCOPY (EGD) (N/A) BRAVO PH STUDY (N/A) as a surgical intervention .  The patient's history has been reviewed, patient examined, no change in status, stable for surgery.  I have reviewed the patients' chart and labs.  Questions were answered to the patient's satisfaction.     [name]  Patient is failed to improve on even high-dose PPI therapy. She may have NERD or non-acidic reflux. Depending on findings of today's EGD, she may need pH impedance while on acid suppression therapy at a tertiary referral center.

## 2011-12-11 NOTE — H&P (View-Only) (Signed)
Referring Provider: Avon Gully, MD Primary Care Physician:  Avon Gully, MD, MD Primary Gastroenterologist:  Dr. Jena Gauss  Chief Complaint  Patient presents with  . Follow-up    GERD, constipation, history clinical granular cell tumor esophagus    HPI:  [name] is a 66 y.o. female here for follow up for GERD and constipation. 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  EUS by Dr. Margaretha Glassing 08/28/10 without evidence of tumor. History of Chronic constipation.  Taking Amitiza BID.  Works well w/ BM 4 times per week.  Denies rectal bleeding or melena.  C/o burning in chest & up esophagus every day.  Awakens in AM w/ symptoms.  Takes Aciphex 20mg  @10am  & 6pm.  Last meal 6pm.  Goes to bed 11:30pm.  No late night snacks, but does admit to sleepwalking and eating w/ ambien.  Weight stable.  Appetite ok.  C/o choking w/ pills & meat.  Denies dysphagia or odynophagia.  Abdominal ultrasound, HIDA, GES, CT negative last year.  C/o intermittent mild umbilicus pain, worse with lifting.  Past Medical History  Diagnosis Date  . Hypertension   . Hyperlipidemia   . Gastroesophageal reflux disease   . Diverticulosis   . Anxiety and depression   . Adenomatous colon polyp 2006    excised in 2006 & 2010Due surveillance 06/2013  . Chronic back pain   . Thyroid nodule   . Fibromyalgia   . Migraines   . Insomnia   . Chronic constipation   . Asthma   . History of benign esophageal tumor 2010    granular cell esophageal tumor (Dx 06/2008), resected via EMR 2011, due repeat EGD 02/2012  . Chest discomfort   . Hiatal hernia     Past Surgical History  Procedure Date  . Tonsillectomy   . Abdominal hysterectomy   . Total knee arthroplasty 2002    Right; previous arthroscopic surgery  . Shoulder arthroscopy     Right; bone spurs removed  . Lung biopsy     negative  . 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 Right  oophorectomy     benign disease  . Colonoscopy 06/2008, 06/2011    sigmoid tics, tubular adenoma; 2013: anal canal hemorrhoids  . Eus 08/2010    Dr [facility] with EGD. Retained food. No recurrent esophageal lesion, bx negative.  . Colonoscopy 07/10/2011    Anal canal hemorrhoids likely the cause of hematochezia in the setting of constipation; otherwise normal rectum ;submucosal  petechiae in left colon of doubtful clinical significance; otherwise, normal colon    Current Outpatient Prescriptions  Medication Sig Dispense Refill  . albuterol (PROVENTIL HFA;VENTOLIN HFA) 108 (90 BASE) MCG/ACT inhaler Inhale 2 puffs into the lungs every 6 (six) hours as needed. For shortness of breath      . amLODipine (NORVASC) 5 MG tablet Take 1 tablet (5 mg total) by mouth daily.  30 tablet  6  . atenolol (TENORMIN) 50 MG tablet Take 50-100 mg by mouth 2 (two) times daily. 50 mg in the morning and a 100 at night      . DULoxetine (CYMBALTA) 60 MG capsule Take 1 capsule (60 mg total) by mouth daily.  30 capsule  1  . ergocalciferol (VITAMIN D2) 50000 UNITS capsule Take 50,000 Units by mouth once a week.        . gabapentin (NEURONTIN) 300 MG capsule Take 1 capsule (300 mg total)  by mouth 2 (two) times daily.  60 capsule  1  . losartan-hydrochlorothiazide (HYZAAR) 100-25 MG per tablet Take 1 tablet by mouth daily.        Marland Kitchen lovastatin (MEVACOR) 40 MG tablet Take 1 tablet (40 mg total) by mouth at bedtime.  30 tablet  12  . lubiprostone (AMITIZA) 24 MCG capsule Take 1 capsule (24 mcg total) by mouth 2 (two) times daily with a meal.  60 capsule  5  . RABEprazole (ACIPHEX) 20 MG tablet Take 1 tablet (20 mg total) by mouth 2 (two) times daily.  60 tablet  11  . terazosin (HYTRIN) 5 MG capsule Take 1 capsule (5 mg total) by mouth at bedtime.  30 capsule  6  . traMADol (ULTRAM) 50 MG tablet Take 50 mg by mouth every 6 (six) hours as needed.      . zolpidem (AMBIEN) 10 MG tablet Take 1 tablet (10 mg total) by mouth daily.   30 tablet  1  . DISCONTD: RABEprazole (ACIPHEX) 20 MG tablet Take 40 mg by mouth 2 (two) times daily.        Marland Kitchen DISCONTD: RABEprazole (ACIPHEX) 20 MG tablet Take 1 tablet (20 mg total) by mouth 2 (two) times daily.  60 tablet  12    Allergies as of 11/21/2011 - Review Complete 11/21/2011  Allergen Reaction Noted  . Codeine Hives, Nausea Only, and Other (See Comments)   . Latex Hives 11/21/2011  . Penicillins Hives   . Sulfonamide derivatives Nausea Only     Review of Systems: Gen: Denies any fever, chills, sweats, anorexia, fatigue, weakness, malaise, weight loss. History of insomnia and sleepwalking CV: Denies chest pain, angina, palpitations, syncope, orthopnea, PND,and claudication. Complaining of bilateral lower extremity edema. Resp: Denies dyspnea at rest, dyspnea with exercise, cough, sputum, wheezing, coughing up blood, and pleurisy. GI: Denies vomiting blood, jaundice, and fecal incontinence.    Derm: Denies rash, itching, dry skin, hives, moles, warts, or unhealing ulcers.  Psych: Denies depression, anxiety, memory loss, suicidal ideation, hallucinations, paranoia, and confusion. Heme: Denies bruising, bleeding, and enlarged lymph nodes.  Physical Exam: BP 110/70  Pulse 75  Temp(Src) 98.6 F (37 C) (Temporal)  Ht 5\' 2"  (1.575 m)  Wt 203 lb 3.2 oz (92.171 kg)  BMI 37.17 kg/m2 General:   Alert,  Well-developed, well-nourished, pleasant and cooperative in NAD Eyes:  Sclera clear, no icterus.   Conjunctiva pink. Mouth:  No deformity or lesions, oropharynx pink and moist. Neck:  Supple; no masses or thyromegaly. Heart:  Regular rate and rhythm; no murmurs, clicks, rubs,  or gallops. Abdomen:  Normal bowel sounds.  No bruits.  Soft, non-tender and non-distended without masses, hepatosplenomegaly or hernias noted.  No guarding or rebound tenderness.   Rectal:  Deferred. Msk:  Symmetrical without gross deformities.  Pulses:  Normal pulses noted. Extremities:  No clubbing or  edema. Neurologic:  Alert and oriented x4;  grossly normal neurologically. Skin:  Intact without significant lesions or rashes.

## 2011-12-13 ENCOUNTER — Encounter: Payer: Self-pay | Admitting: Internal Medicine

## 2011-12-16 ENCOUNTER — Encounter (HOSPITAL_COMMUNITY): Payer: Self-pay | Admitting: Internal Medicine

## 2011-12-18 ENCOUNTER — Telehealth: Payer: Self-pay

## 2011-12-18 NOTE — Telephone Encounter (Signed)
Pt came by office- c/o still having pain in her throat since procedure last week. Mouth is dry and it feels like there are" razor blades in throat when she swallows" she has not tried chloroseptic spray, it is not causing her to choke when eating. Drinking makes it feel better temporary. Some nausea, no vomiting, no fever. It is not causing her to have breathing problems. Please advise.   pts number [phone] Pt uses [facility].

## 2011-12-18 NOTE — Telephone Encounter (Signed)
I did not biopsy or dilate her esophagus. Her symptoms likely unrelated to EGD. I recommend she use Chloraseptic spray as needed. Hopefully, pH/Impedence study scheduled in the near future

## 2011-12-18 NOTE — Telephone Encounter (Signed)
Pt aware. She stated she has not been scheduled yet.   Benedetto Goad, please make sure pt is scheduled for test. Thanks.

## 2011-12-19 NOTE — Telephone Encounter (Signed)
Referral for the pH/Impedence test was faxed to [facility] with instructions they will contact the patient for appointment date and time and I [facility] for patient and to call me back if she has any questions.

## 2011-12-30 ENCOUNTER — Other Ambulatory Visit (HOSPITAL_COMMUNITY): Payer: Self-pay | Admitting: *Deleted

## 2011-12-30 DIAGNOSIS — F330 Major depressive disorder, recurrent, mild: Secondary | ICD-10-CM

## 2011-12-31 ENCOUNTER — Ambulatory Visit (HOSPITAL_COMMUNITY): Payer: Self-pay | Admitting: Psychiatry

## 2011-12-31 NOTE — Telephone Encounter (Signed)
Per record, RX from 6/4 was for 30 days + 1 refill. Per pharmacy, RX from 6/4 recorded as 30 days only without a refill. Refill authorized based on original order.

## 2012-01-01 ENCOUNTER — Telehealth: Payer: Self-pay | Admitting: Internal Medicine

## 2012-01-01 NOTE — Telephone Encounter (Signed)
[name] is scheduled for her pH+Impedence study at [facility]  on Monday Aug 12th and patient is aware

## 2012-01-03 ENCOUNTER — Ambulatory Visit: Payer: Self-pay | Admitting: Cardiology

## 2012-01-06 NOTE — Progress Notes (Signed)
EGD JUN 2013

## 2012-01-06 NOTE — Progress Notes (Signed)
REVIEWED.  

## 2012-01-07 ENCOUNTER — Ambulatory Visit (HOSPITAL_COMMUNITY)
Admission: RE | Admit: 2012-01-07 | Discharge: 2012-01-07 | Disposition: A | Discharge status: Home / Self Care | Payer: Medicare Other | Source: Ambulatory Visit | Attending: Internal Medicine | Admitting: Internal Medicine

## 2012-01-07 ENCOUNTER — Other Ambulatory Visit (HOSPITAL_COMMUNITY): Payer: Self-pay | Admitting: Internal Medicine

## 2012-01-07 DIAGNOSIS — S161XXA Strain of muscle, fascia and tendon at neck level, initial encounter: Secondary | ICD-10-CM

## 2012-01-07 DIAGNOSIS — M47812 Spondylosis without myelopathy or radiculopathy, cervical region: Secondary | ICD-10-CM | POA: Insufficient documentation

## 2012-01-07 DIAGNOSIS — S139XXA Sprain of joints and ligaments of unspecified parts of neck, initial encounter: Secondary | ICD-10-CM | POA: Insufficient documentation

## 2012-01-07 DIAGNOSIS — X58XXXA Exposure to other specified factors, initial encounter: Secondary | ICD-10-CM | POA: Insufficient documentation

## 2012-01-09 ENCOUNTER — Ambulatory Visit (INDEPENDENT_AMBULATORY_CARE_PROVIDER_SITE_OTHER): Payer: Medicare Other | Admitting: Psychiatry

## 2012-01-09 DIAGNOSIS — F330 Major depressive disorder, recurrent, mild: Secondary | ICD-10-CM

## 2012-01-09 MED ORDER — DULOXETINE HCL 60 MG PO CPEP: 60.0000 mg | ORAL_CAPSULE | Freq: Every day | ORAL | Status: DC

## 2012-01-09 MED ORDER — ZOLPIDEM TARTRATE 10 MG PO TABS: 10.0000 mg | ORAL_TABLET | Freq: Every day | ORAL | Status: DC

## 2012-01-09 NOTE — Progress Notes (Signed)
Chief complaint Medication management and followup.  History of presenting illness Patient is 66 year old [place] American female who came for her followup appointment. She's been compliant with her medication and reported no side effects. She continues to have residual chronic pain.  She is taking tramadol every day prescribed by her neurologist.  She was given Lortab by her primary care physician however she stopped taking it.  She is taking Neurontin 400 mg 3 times a day.  Overall she has been stable on her medication except for chronic pain.  She sleeping better.  She denies any agitation anger mood swing.  She denies any paranoia or any hallucination.  She has chronic residual symptoms of depression but overall her level of functioning is better.  She's not drinking or using any illegal substance.  Current psychiatric medication Neurontin 300 mg 3 times a day prescribed by Dr. Harrel Carina.  Cymbalta 60 mg daily Ambien 10 mg at bedtime.  Psychiatric history Patient has a long history of depression.  She has been seeing in this office since May 1997.  Patient history of suicidal attempt however endorse chronic depression and anxiety.  In the past she has taken Prozac but did not like how to taking few doses, she also has taken Seroquel, Abilify, Xanax, Pamelor, and amitriptyline.  Patient denies a history of paranoia or delusions however there are times when she has been very depressed and have a lot of negative symptoms.  Alcohol and substance use history Patient denies any history of alcohol or substance use.  Medical history Patient has history of hypertension, hyperlipidemia, GERD, diverticulosis, colonic polyp, chronic back pain, fibromyalgia, chronic constipation, benign tumor of esophagus and chronic headache.  Her primary care physician is Dr. Felecia Shelling and she also sees Dr. Harrel Carina for chronic pain, fibromyalgia and headache.  Mental status examination Patient is casually dressed and fairly  groomed. She is calm cooperative and maintained fair eye contact. She described her mood is neutral and her affect is mood appropriate.  Her speech is coherent but slow.  Her thought processes clear and logical. She denies any active or passive suicidal thoughts or homicidal thoughts. She denies any auditory or visual hallucination. There no psychotic symptoms present at this time. She's alert and oriented x3. Her insight judgment impulse control is okay  Assessment Axis I Maj. depressive disorder Axis II deferred Axis III see medical history Axis IV are to moderate.  Plan At this time her depression is fairly stable on Cymbalta.  She sleeping better with Ambien.  She is taking Neurontin from her primary care physician.  She reported no side effects of medication.   will continue her current psychiatric medication.  I recommend to call us if she is a question or concern about the medication or if she feel worsening of the symptoms.  I will see her again in 2 months.  Portion of this note is generated with voice recognition software and may contain typographical better.

## 2012-01-21 ENCOUNTER — Other Ambulatory Visit: Payer: Self-pay | Admitting: Cardiology

## 2012-01-21 ENCOUNTER — Ambulatory Visit (INDEPENDENT_AMBULATORY_CARE_PROVIDER_SITE_OTHER): Payer: Medicare Other | Admitting: Cardiology

## 2012-01-21 ENCOUNTER — Encounter: Payer: Self-pay | Admitting: Cardiology

## 2012-01-21 VITALS — BP 120/80 | HR 67 | Ht 62.0 in | Wt 202.0 lb

## 2012-01-21 DIAGNOSIS — I10 Essential (primary) hypertension: Secondary | ICD-10-CM

## 2012-01-21 DIAGNOSIS — E785 Hyperlipidemia, unspecified: Secondary | ICD-10-CM

## 2012-01-21 LAB — COMPREHENSIVE METABOLIC PANEL
ALT: 11 U/L (ref 0–35)
AST: 18 U/L (ref 0–37)
Albumin: 4 g/dL (ref 3.5–5.2)
Alkaline Phosphatase: 67 U/L (ref 39–117)
BUN: 10 mg/dL (ref 6–23)
CO2: 28 mEq/L (ref 19–32)
Calcium: 10.2 mg/dL (ref 8.4–10.5)
Chloride: 104 mEq/L (ref 96–112)
Creat: 1.07 mg/dL (ref 0.50–1.10)
Glucose, Bld: 85 mg/dL (ref 70–99)
Potassium: 3 mEq/L — ABNORMAL LOW (ref 3.5–5.3)
Sodium: 141 mEq/L (ref 135–145)
Total Bilirubin: 0.3 mg/dL (ref 0.3–1.2)
Total Protein: 6.1 g/dL (ref 6.0–8.3)

## 2012-01-21 MED ORDER — TERAZOSIN HCL 10 MG PO CAPS: 10.0000 mg | ORAL_CAPSULE | Freq: Every day | ORAL | Status: DC

## 2012-01-21 NOTE — Assessment & Plan Note (Signed)
Adequate but not ideal blood pressure control.  Dose of Hytrin will be increased to 10 mg per day, which should provide optimal hypertension treatment.

## 2012-01-21 NOTE — Assessment & Plan Note (Signed)
Excellent control of hyperlipidemia, especially considering the absence of known vascular disease.  Current treatment with moderate dose lovastatin is quite adequate.

## 2012-01-21 NOTE — Patient Instructions (Signed)
Your physician recommends that you schedule a follow-up appointment in: 1 year  Your physician has recommended you make the following change in your medication:  1 - INCREASE Hytrin to 10 mg daily
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
## 2012-01-22 ENCOUNTER — Encounter: Payer: Self-pay | Admitting: Cardiology

## 2012-01-23 ENCOUNTER — Other Ambulatory Visit: Payer: Self-pay | Admitting: *Deleted

## 2012-01-23 DIAGNOSIS — E876 Hypokalemia: Secondary | ICD-10-CM

## 2012-01-23 MED ORDER — POTASSIUM CHLORIDE CRYS ER 20 MEQ PO TBCR: 20.0000 meq | EXTENDED_RELEASE_TABLET | Freq: Every day | ORAL | Status: DC

## 2012-01-31 ENCOUNTER — Telehealth: Payer: Self-pay | Admitting: Cardiology

## 2012-01-31 NOTE — Telephone Encounter (Signed)
Pt states she was put on potassium and wants to know if this could be the cause of her feet swelling.

## 2012-01-31 NOTE — Telephone Encounter (Signed)
Hytrin can cause dependent edema, or edema most notably at HS from being on her feet all day. Should not worry unless the edema is causing discomfort.

## 2012-01-31 NOTE — Telephone Encounter (Signed)
Patient made aware of recommendations.   

## 2012-01-31 NOTE — Telephone Encounter (Signed)
Patient has had increased edema over the past couple of days, without weight gain or shortness of breath. Hytrin was increased to 10 mg at last visit and she was inquiring as to wether or not this may have caused her edema.

## 2012-02-03 ENCOUNTER — Telehealth: Payer: Self-pay | Admitting: Internal Medicine

## 2012-02-03 NOTE — Telephone Encounter (Signed)
[facility] called and stated that they were unable to incubate [name] through her nostril for her Ph study so procedure was aborted and cancelled

## 2012-02-04 NOTE — Telephone Encounter (Signed)
Difficult clinical scenario without pH/ impedance data. Since she had seen one of the GI doctors over at [facility] previously (I believe may be Dr. Margaretha Glassing), I think would be a good idea just to get her an appointment to go back to their be seen once to get their opinion

## 2012-02-05 NOTE — Telephone Encounter (Signed)
Patient is scheduled on Thursday October 31st at 2:45 at [facility] with Dr. Margaretha Glassing and she is aware

## 2012-02-05 NOTE — Telephone Encounter (Signed)
[name], please refer to [facility].

## 2012-02-06 ENCOUNTER — Other Ambulatory Visit: Payer: Self-pay | Admitting: Neurology

## 2012-02-06 DIAGNOSIS — I639 Cerebral infarction, unspecified: Secondary | ICD-10-CM

## 2012-02-07 ENCOUNTER — Other Ambulatory Visit: Payer: Self-pay | Admitting: Cardiology

## 2012-02-14 ENCOUNTER — Ambulatory Visit (HOSPITAL_COMMUNITY)
Admission: RE | Admit: 2012-02-14 | Discharge: 2012-02-14 | Disposition: A | Discharge status: Home / Self Care | Payer: Medicare Other | Source: Ambulatory Visit | Attending: Neurology | Admitting: Neurology

## 2012-02-14 DIAGNOSIS — G939 Disorder of brain, unspecified: Secondary | ICD-10-CM | POA: Insufficient documentation

## 2012-02-14 DIAGNOSIS — H44529 Atrophy of globe, unspecified eye: Secondary | ICD-10-CM | POA: Insufficient documentation

## 2012-02-14 DIAGNOSIS — M629 Disorder of muscle, unspecified: Secondary | ICD-10-CM | POA: Insufficient documentation

## 2012-02-14 DIAGNOSIS — H0520 Unspecified exophthalmos: Secondary | ICD-10-CM | POA: Insufficient documentation

## 2012-02-14 DIAGNOSIS — E236 Other disorders of pituitary gland: Secondary | ICD-10-CM | POA: Insufficient documentation

## 2012-02-14 DIAGNOSIS — I6529 Occlusion and stenosis of unspecified carotid artery: Secondary | ICD-10-CM | POA: Insufficient documentation

## 2012-02-14 DIAGNOSIS — I639 Cerebral infarction, unspecified: Secondary | ICD-10-CM

## 2012-02-14 DIAGNOSIS — M2420 Disorder of ligament, unspecified site: Secondary | ICD-10-CM | POA: Insufficient documentation

## 2012-02-14 DIAGNOSIS — R51 Headache: Secondary | ICD-10-CM | POA: Insufficient documentation

## 2012-02-21 ENCOUNTER — Other Ambulatory Visit: Payer: Self-pay | Admitting: *Deleted

## 2012-02-21 DIAGNOSIS — E876 Hypokalemia: Secondary | ICD-10-CM

## 2012-03-06 ENCOUNTER — Encounter: Payer: Self-pay | Admitting: *Deleted

## 2012-03-10 ENCOUNTER — Ambulatory Visit (INDEPENDENT_AMBULATORY_CARE_PROVIDER_SITE_OTHER): Payer: Medicare Other | Admitting: Psychiatry

## 2012-03-10 ENCOUNTER — Encounter (HOSPITAL_COMMUNITY): Payer: Self-pay | Admitting: Psychiatry

## 2012-03-10 VITALS — Wt 200.0 lb

## 2012-03-10 DIAGNOSIS — F330 Major depressive disorder, recurrent, mild: Secondary | ICD-10-CM

## 2012-03-10 DIAGNOSIS — F329 Major depressive disorder, single episode, unspecified: Secondary | ICD-10-CM

## 2012-03-10 MED ORDER — ZOLPIDEM TARTRATE 10 MG PO TABS: 10.0000 mg | ORAL_TABLET | Freq: Every day | ORAL | Status: DC

## 2012-03-10 MED ORDER — DULOXETINE HCL 60 MG PO CPEP: 60.0000 mg | ORAL_CAPSULE | Freq: Every day | ORAL | Status: DC

## 2012-03-10 NOTE — Progress Notes (Signed)
Chief complaint Medication management and followup.  History of presenting illness Patient came for her followup appointment.  She's compliant with the medication and reported feeling better with the current psychiatric medication.  She still has some time burning mouth and she has seen multiple Dr. for her chronic burning mouth .  She sleeping better with Ambien.  Her blood patient is still issue and she has taken multiple medication to control her blood pressure.  She has recently seen her cardiologist were adjusted software antihypertensive medication.  She is taking pain medication along with Neurontin 300 mg 3 times a day.  She denies any anger agitation or any mood swing.  She denies any paranoia or any hallucination.  She sleeping better.  She has chronic health issues but she is handing these issues somewhat better than past.  She's not drinking or using any illegal substance.  Current psychiatric medication Neurontin 300 mg 3 times a day prescribed by Dr. Harrel Carina.  Cymbalta 60 mg daily Ambien 10 mg at bedtime.  Psychiatric history Patient has a long history of depression.  She has been seeing in this office since May 1997.  Patient denies any history of suicidal attempt however endorse chronic depression and anxiety.  In the past she has taken Prozac but did not like after taking few doses, she also has taken Seroquel, Abilify, Xanax, Pamelor, and amitriptyline.  Patient denies a history of paranoia or delusions however there are times when she has been very depressed and have a lot of negative symptoms.  Alcohol and substance use history Patient denies any history of alcohol or substance use.  Medical history Patient has history of hypertension, hyperlipidemia, GERD, diverticulosis, colonic polyp, chronic back pain, fibromyalgia, chronic constipation, benign tumor of esophagus and chronic headache.  Her primary care physician is Dr. Felecia Shelling, she sees Dr. Harrel Carina for chronic pain, fibromyalgia  and headache. Her cardiologist is Dr Mylo Red.  Mental status examination Patient is casually dressed and fairly groomed. She is calm cooperative and maintained fair eye contact. She described her mood is okay and her affect is mood appropriate.  Her speech is coherent but slow.  Her thought processes clear and logical. She denies any active or passive suicidal thoughts or homicidal thoughts. She denies any auditory or visual hallucination. There no psychotic symptoms present at this time. She's alert and oriented x3. Her insight judgment impulse control is okay  Assessment Axis I Maj. depressive disorder Axis II deferred Axis III see medical history Axis IV are to moderate.  Plan At this time her depression is fairly stable on Cymbalta and Ambien.  She reported no side effects of medication.  She does not ask for early refills of Ambien.  I have explained risks and benefits of medication especially she is taking multiple pain medication.  I recommend to call us if she is any question or concern if she feels worsening of the symptom.  I will see her again in 2 months.  Portion of this note is generated with voice recognition software and may contain typographical better.

## 2012-05-05 ENCOUNTER — Ambulatory Visit (HOSPITAL_COMMUNITY): Payer: Self-pay | Admitting: Psychiatry

## 2012-05-06 ENCOUNTER — Ambulatory Visit (INDEPENDENT_AMBULATORY_CARE_PROVIDER_SITE_OTHER): Payer: Medicare Other | Admitting: Psychiatry

## 2012-05-06 ENCOUNTER — Encounter (HOSPITAL_COMMUNITY): Payer: Self-pay | Admitting: Psychiatry

## 2012-05-06 VITALS — BP 138/78 | HR 60 | Ht 62.0 in | Wt 203.4 lb

## 2012-05-06 DIAGNOSIS — F330 Major depressive disorder, recurrent, mild: Secondary | ICD-10-CM

## 2012-05-06 DIAGNOSIS — IMO0001 Reserved for inherently not codable concepts without codable children: Secondary | ICD-10-CM

## 2012-05-06 DIAGNOSIS — G4700 Insomnia, unspecified: Secondary | ICD-10-CM | POA: Insufficient documentation

## 2012-05-06 DIAGNOSIS — F32A Depression, unspecified: Secondary | ICD-10-CM

## 2012-05-06 DIAGNOSIS — F329 Major depressive disorder, single episode, unspecified: Secondary | ICD-10-CM

## 2012-05-06 DIAGNOSIS — F419 Anxiety disorder, unspecified: Secondary | ICD-10-CM

## 2012-05-06 DIAGNOSIS — F5105 Insomnia due to other mental disorder: Secondary | ICD-10-CM | POA: Insufficient documentation

## 2012-05-06 MED ORDER — AMITRIPTYLINE HCL 25 MG PO TABS: 25.0000 mg | ORAL_TABLET | Freq: Every day | ORAL | Status: DC

## 2012-05-06 MED ORDER — GABAPENTIN 300 MG PO CAPS: ORAL_CAPSULE | ORAL | Status: DC

## 2012-05-06 MED ORDER — DULOXETINE HCL 20 MG PO CPEP: 40.0000 mg | ORAL_CAPSULE | Freq: Two times a day (BID) | ORAL | Status: DC

## 2012-05-06 NOTE — Progress Notes (Signed)
Chief complaint Medication management and followup.  History of presenting illness Patient came for her followup appointment.  She's complining about memory problems.  She has family members who had Alzheimers and she is concerned about herself.  She heard that the statin may be harming her from the TV.  Discussed her pain regimen and her regimen for insomnia.  She asked if there was an alternitive for the Ambien at night.  I offered Neurontin with Elavil for that. She has been on Remeron which caused weird crazy dreams.  She has black outs in the middle of night where she eats or hangs pictures and such on the Ambien.  She asks about the efficacy of Botox in the relief of muscle tension headaches.  I told her that I do not have any experience in that.  She is willing to try the increased dose of Neurontin at HS along with Elavil for her sedation.  She does not walk or engage in any activities to manage her pain.   She is afraid of doing much walking for fear of her (R) knee buckling.    Current psychiatric medication Neurontin 300 mg 3 times a day prescribed by Dr. Harrel Carina.  Cymbalta 60 mg daily Ambien 10 mg at bedtime.  Psychiatric history Patient has a long history of depression.  She has been seeing in this office since May 1997.  Patient denies any history of suicidal attempt however endorse chronic depression and anxiety.  In the past she has taken Prozac but did not like after taking few doses, she also has taken Seroquel, Abilify, Xanax, Pamelor, and amitriptyline.  Patient denies a history of paranoia or delusions however there are times when she has been very depressed and have a lot of negative symptoms.  Alcohol and substance use history Patient denies any history of alcohol or substance use.  Medical history Patient has history of hypertension, hyperlipidemia, GERD, diverticulosis, colonic polyp, chronic back pain, fibromyalgia, chronic constipation, benign tumor of esophagus and chronic  headache.  Her primary care physician is Dr. Felecia Shelling, she sees Dr. Harrel Carina for chronic pain, fibromyalgia and headache. Her cardiologist is Dr Mylo Red.  Mental status examination Patient is casually dressed and fairly groomed. She is calm cooperative and maintained fair eye contact. She described her mood is okay and her affect is mood appropriate.  Her speech is coherent but slow.  Her thought processes clear and logical. She denies any active or passive suicidal thoughts or homicidal thoughts. She denies any auditory or visual hallucination. There no psychotic symptoms present at this time. She's alert and oriented x3. Her insight judgment impulse control is okay  Assessment Axis I Maj. depressive disorder Axis II deferred Axis III see medical history Axis IV are to moderate.  Plan Took her ht, wt, bp, p.  Reviewed her CC, med hx, surg hx, family hx, symptoms, pain management and routine to manage pain, insomnia and the response to Ambien.  Offered replacement and pt agreed to try back on Elavil and double dose of Neurontin at HS in place of the Ambien.

## 2012-05-06 NOTE — Addendum Note (Signed)
Addended by: Mike Craze on: 05/06/2012 09:37 AM   Modules accepted: Level of Service

## 2012-05-06 NOTE — Patient Instructions (Signed)
Cymbalta is lower dose pill.  Take 2 twice a day  Neurontin may help get to sleep with a doubled up dose at bedtime.  Elavil helps with pain, depression. Anxiety, and insomnia  Call if any problems.

## 2012-05-11 ENCOUNTER — Telehealth (HOSPITAL_COMMUNITY): Payer: Self-pay | Admitting: *Deleted

## 2012-05-11 DIAGNOSIS — F5105 Insomnia due to other mental disorder: Secondary | ICD-10-CM

## 2012-05-11 DIAGNOSIS — F329 Major depressive disorder, single episode, unspecified: Secondary | ICD-10-CM

## 2012-05-11 DIAGNOSIS — F32A Depression, unspecified: Secondary | ICD-10-CM

## 2012-05-11 MED ORDER — AMITRIPTYLINE HCL 25 MG PO TABS: 25.0000 mg | ORAL_TABLET | Freq: Every day | ORAL | Status: DC

## 2012-05-11 NOTE — Telephone Encounter (Signed)
Ordered Amitriptyline and associated it with anxiety/depression as well as insomnia

## 2012-05-11 NOTE — Telephone Encounter (Signed)
Preauth obtained for Elavil for insomnia and depression which seemed to confound the system there.

## 2012-05-13 ENCOUNTER — Other Ambulatory Visit: Payer: Self-pay | Admitting: Cardiology

## 2012-06-03 ENCOUNTER — Encounter (HOSPITAL_COMMUNITY): Payer: Self-pay | Admitting: Psychiatry

## 2012-06-03 ENCOUNTER — Ambulatory Visit (INDEPENDENT_AMBULATORY_CARE_PROVIDER_SITE_OTHER): Payer: Medicare Other | Admitting: Psychiatry

## 2012-06-03 VITALS — Wt 200.8 lb

## 2012-06-03 DIAGNOSIS — F330 Major depressive disorder, recurrent, mild: Secondary | ICD-10-CM

## 2012-06-03 DIAGNOSIS — F5105 Insomnia due to other mental disorder: Secondary | ICD-10-CM

## 2012-06-03 DIAGNOSIS — IMO0001 Reserved for inherently not codable concepts without codable children: Secondary | ICD-10-CM

## 2012-06-03 DIAGNOSIS — F329 Major depressive disorder, single episode, unspecified: Secondary | ICD-10-CM

## 2012-06-03 DIAGNOSIS — F32A Depression, unspecified: Secondary | ICD-10-CM

## 2012-06-03 DIAGNOSIS — F419 Anxiety disorder, unspecified: Secondary | ICD-10-CM

## 2012-06-03 MED ORDER — GABAPENTIN 300 MG PO CAPS: ORAL_CAPSULE | ORAL | Status: DC

## 2012-06-03 MED ORDER — ZOLPIDEM TARTRATE 10 MG PO TABS: 10.0000 mg | ORAL_TABLET | Freq: Every evening | ORAL | Status: AC | PRN

## 2012-06-03 MED ORDER — DULOXETINE HCL 20 MG PO CPEP: 40.0000 mg | ORAL_CAPSULE | Freq: Two times a day (BID) | ORAL | Status: DC

## 2012-06-03 NOTE — Patient Instructions (Addendum)
Could use "Move Free" or "Osteo bi Flex" for arthritic pain.   The important ingredients are Chondrotin Sulfate and Glucosamine.  Tumeric is also helpful for arthritis.   Krill oil and cod liver oil may be helpful for arthritis.   Have a happy holiday.

## 2012-06-03 NOTE — Progress Notes (Signed)
Chief complaint Chief Complaint  Patient presents with  . Depression  . Follow-up  . Medication Refill   Subjective: "I had to go back to the Ambien.  I couldn't take the Elavil.  I'm doing sort of okay.  The holidays are rough".  History of presenting illness Patient came for her followup appointment.  Pt returns reporting that she tried the Elavil and then stopped it and then retried it and every time she noted something hold her feet or her arm or a headache in the AM with it.  She had to go back on the Ambien for the sleep.  She and her daughter will have to sit around and she will end up going to bed on the holidays. She is pretty satisfied with the current regimen without any side effects that she has noticed.  She is doing fairly well.   Current psychiatric medication Neurontin 300 mg 3 times a day prescribed by Dr. Harrel Carina.   Cymbalta 40 mg BID Ambien 10 mg at bedtime  Psychiatric history Patient has a long history of depression.  She has been seeing in this office since May 1997.  Patient denies any history of suicidal attempt however endorse chronic depression and anxiety.  In the past she has taken Prozac but did not like after taking few doses, she also has taken Seroquel, Abilify, Xanax, Pamelor, and amitriptyline.  Patient denies a history of paranoia or delusions however there are times when she has been very depressed and have a lot of negative symptoms.  Alcohol and substance use history Patient denies any history of alcohol or substance use.  Medical history Patient has history of hypertension, hyperlipidemia, GERD, diverticulosis, colonic polyp, chronic back pain, fibromyalgia, chronic constipation, benign tumor of esophagus and chronic headache.  Her primary care physician is Dr. Felecia Shelling, she sees Dr. Harrel Carina for chronic pain, fibromyalgia and headache. Her cardiologist is Dr Mylo Red.  Mental status examination Patient is casually dressed and fairly groomed. She is calm  cooperative and maintained fair eye contact. She described her mood is okay and her affect is mood appropriate.  Her speech is coherent but slow.  Her thought processes clear and logical. She denies any active or passive suicidal thoughts or homicidal thoughts. She denies any auditory or visual hallucination. There no psychotic symptoms present at this time. She's alert and oriented x3. Her insight judgment impulse control is okay  Assessment Axis I Maj. depressive disorder Axis II deferred Axis III see medical history Axis IV are to moderate.  Plan I took her vitals.  I reviewed CC, tobacco/med/surg Hx, meds effects/ side effects, problem list, therapies and responses as well as current situation/symptoms discussed options. See orders and pt instructions for more details.

## 2012-06-05 ENCOUNTER — Telehealth (HOSPITAL_COMMUNITY): Payer: Self-pay | Admitting: Psychiatry

## 2012-06-05 NOTE — Telephone Encounter (Signed)
Phone message completed in the phone message section.  

## 2012-06-20 ENCOUNTER — Emergency Department (HOSPITAL_COMMUNITY)
Admission: EM | Admit: 2012-06-20 | Discharge: 2012-06-20 | Disposition: A | Discharge status: Home / Self Care | Payer: Medicare Other | Attending: Emergency Medicine | Admitting: Emergency Medicine

## 2012-06-20 ENCOUNTER — Emergency Department (HOSPITAL_COMMUNITY): Payer: Medicare Other

## 2012-06-20 ENCOUNTER — Encounter (HOSPITAL_COMMUNITY): Payer: Self-pay

## 2012-06-20 DIAGNOSIS — F341 Dysthymic disorder: Secondary | ICD-10-CM | POA: Insufficient documentation

## 2012-06-20 DIAGNOSIS — IMO0001 Reserved for inherently not codable concepts without codable children: Secondary | ICD-10-CM | POA: Insufficient documentation

## 2012-06-20 DIAGNOSIS — Z8719 Personal history of other diseases of the digestive system: Secondary | ICD-10-CM | POA: Insufficient documentation

## 2012-06-20 DIAGNOSIS — Z79899 Other long term (current) drug therapy: Secondary | ICD-10-CM | POA: Insufficient documentation

## 2012-06-20 DIAGNOSIS — Z860100 Personal history of colon polyps, unspecified: Secondary | ICD-10-CM | POA: Insufficient documentation

## 2012-06-20 DIAGNOSIS — G4700 Insomnia, unspecified: Secondary | ICD-10-CM | POA: Insufficient documentation

## 2012-06-20 DIAGNOSIS — K219 Gastro-esophageal reflux disease without esophagitis: Secondary | ICD-10-CM | POA: Insufficient documentation

## 2012-06-20 DIAGNOSIS — E785 Hyperlipidemia, unspecified: Secondary | ICD-10-CM | POA: Insufficient documentation

## 2012-06-20 DIAGNOSIS — I10 Essential (primary) hypertension: Secondary | ICD-10-CM | POA: Insufficient documentation

## 2012-06-20 DIAGNOSIS — M25519 Pain in unspecified shoulder: Secondary | ICD-10-CM | POA: Insufficient documentation

## 2012-06-20 DIAGNOSIS — M25511 Pain in right shoulder: Secondary | ICD-10-CM

## 2012-06-20 DIAGNOSIS — M549 Dorsalgia, unspecified: Secondary | ICD-10-CM | POA: Insufficient documentation

## 2012-06-20 DIAGNOSIS — G8929 Other chronic pain: Secondary | ICD-10-CM | POA: Insufficient documentation

## 2012-06-20 DIAGNOSIS — Z8679 Personal history of other diseases of the circulatory system: Secondary | ICD-10-CM | POA: Insufficient documentation

## 2012-06-20 DIAGNOSIS — J45909 Unspecified asthma, uncomplicated: Secondary | ICD-10-CM | POA: Insufficient documentation

## 2012-06-20 MED ORDER — OXYCODONE-ACETAMINOPHEN 5-325 MG PO TABS: 1.0000 | ORAL_TABLET | ORAL | Status: DC | PRN

## 2012-06-20 MED ORDER — KETOROLAC TROMETHAMINE 60 MG/2ML IM SOLN
60.0000 mg | Freq: Once | INTRAMUSCULAR | Status: AC
  Administered 2012-06-20: 60 mg via INTRAMUSCULAR
  Filled 2012-06-20: qty 2

## 2012-06-20 MED ORDER — ACYCLOVIR 800 MG PO TABS: 800.0000 mg | ORAL_TABLET | Freq: Every day | ORAL | Status: DC

## 2012-06-20 NOTE — ED Notes (Signed)
Pt reports having right side neck pain that radiates to back, denies any known injury

## 2012-06-20 NOTE — ED Notes (Addendum)
Pt c/o neck and back pain. Pt states pain began in right neck and now radiates down back. Pt states pain was intermittent but is now constant.

## 2012-06-23 NOTE — ED Provider Notes (Signed)
History     CSN: [id number]  Arrival date & time 06/20/12  1305   First MD Initiated Contact with Patient 06/20/12 1457      Chief Complaint  Patient presents with  . Neck Pain  . Back Pain    (Consider location/radiation/quality/duration/timing/severity/associated sxs/prior treatment) HPI Comments: [name] presents with a sharp, burning quality pain that started last night while watching tv.  The pain is constant but worse with movement and with even light touch of her skin at this site.  The pain is starting to radiate across her upper back. She denies injury but does have a history of chronic neck and back pain associated with fibromyalgia.  She denies fevers, chills, rash, recent illness and denies shortness of breath and chest pain.  She denies weakness or numbness in her arms.  She has taken her regularly prescribed tramadol which has not relieved her pain.  Patient is a 66 y.o. female presenting with back pain. The history is provided by the patient.  Back Pain     Past Medical History  Diagnosis Date  . Hypertension   . Hyperlipidemia   . Gastroesophageal reflux disease   . Diverticulosis   . Anxiety and depression   . Adenomatous colon polyp 2006    excised in 2006 & 2010Due surveillance 06/2013  . Chronic back pain   . Thyroid nodule   . Fibromyalgia   . Migraines   . Insomnia   . Chronic constipation   . Asthma   . History of benign esophageal tumor 2010    granular cell esophageal tumor (Dx 06/2008), resected via EMR 2011, due repeat EGD 02/2012  . Chest discomfort   . Hiatal hernia   . Depression   . Anxiety     Past Surgical History  Procedure Date  . Tonsillectomy   . Abdominal hysterectomy   . Total knee arthroplasty 2002    Right; previous arthroscopic surgery  . Shoulder arthroscopy     Right; bone spurs removed  . Lung biopsy     negative  . 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 Right  oophorectomy     benign disease  . Colonoscopy 06/2008, 06/2011    sigmoid tics, tubular adenoma; 2013: anal canal hemorrhoids  . Eus 08/2010    Dr [facility] with EGD. Retained food. No recurrent esophageal lesion, bx negative.  . Colonoscopy 07/10/2011    Anal canal hemorrhoids likely the cause of hematochezia in the setting of constipation; otherwise normal rectum ;submucosal  petechiae in left colon of doubtful clinical significance; otherwise, normal colon  . Esophagogastroduodenoscopy 12/11/2011    Procedure: ESOPHAGOGASTRODUODENOSCOPY (EGD);  Surgeon: Corbin Ade, MD;  Location: [facility];  Service: Endoscopy;  Laterality: N/A;  11:00  . Bravo ph study 12/11/2011    Procedure: BRAVO PH STUDY;  Surgeon: Corbin Ade, MD;  Location: [facility];  Service: Endoscopy;  Laterality: N/A;    Family History  Problem Relation Age of Onset  . Stroke Father   . Heart attack Mother   . Depression Mother   . Colon cancer Paternal Aunt   . Colon cancer Paternal Uncle   . Dementia Maternal Uncle     History  Substance Use Topics  . Smoking status: Never Smoker   . Smokeless tobacco: Never Used  . Alcohol Use: No    OB History    Grav Para Term Preterm Abortions TAB SAB Ect Mult  Living                  Review of Systems  Musculoskeletal: Positive for back pain.    Allergies  Elavil; Codeine; Latex; Penicillins; Sulfonamide derivatives; Ambien; and Remeron  Home Medications   Current Outpatient Rx  Name  Route  Sig  Dispense  Refill  . AMLODIPINE BESYLATE 5 MG PO TABS      TAKE ONE (1) TABLET BY MOUTH EVERY      DAY   30 tablet   5   . ATENOLOL 50 MG PO TABS   Oral   Take 50-100 mg by mouth 2 (two) times daily. 50 mg in the morning and a 100 at night         . DESOXIMETASONE 0.05 % EX CREA   Topical   Apply 1 application topically daily.          . DULOXETINE HCL 20 MG PO CPEP   Oral   Take 2 capsules (40 mg total) by mouth 2 (two) times daily.   120  capsule   1   . GABAPENTIN 300 MG PO CAPS      Take by mouth one twice a day for anxiety/pain and 2 at bedtime for insomnia and anxiety/pain. Monitor for effect on acid reflux   120 capsule   1   . LOSARTAN POTASSIUM-HCTZ 100-25 MG PO TABS   Oral   Take 1 tablet by mouth daily.           Marland Kitchen LOVASTATIN 40 MG PO TABS      TAKE ONE TABLET DAILY AT BEDTIME   30 tablet   6   . LUBIPROSTONE 24 MCG PO CAPS   Oral   Take 1 capsule (24 mcg total) by mouth 2 (two) times daily with a meal.   60 capsule   5   . POTASSIUM CHLORIDE CRYS ER 20 MEQ PO TBCR   Oral   Take 1 tablet (20 mEq total) by mouth daily.   30 tablet   12   . RABEPRAZOLE SODIUM 20 MG PO TBEC   Oral   Take 1 tablet (20 mg total) by mouth 2 (two) times daily.   60 tablet   11   . TERAZOSIN HCL 10 MG PO CAPS   Oral   Take 1 capsule (10 mg total) by mouth at bedtime.   30 capsule   12   . TRAMADOL HCL 50 MG PO TABS   Oral   Take 50 mg by mouth every 6 (six) hours as needed. For pain         . ZOLPIDEM TARTRATE 10 MG PO TABS   Oral   Take 1 tablet (10 mg total) by mouth at bedtime as needed (insomnia).   31 tablet   1   . ACYCLOVIR 800 MG PO TABS   Oral   Take 1 tablet (800 mg total) by mouth 5 (five) times daily.   50 tablet   0   . ALBUTEROL SULFATE HFA 108 (90 BASE) MCG/ACT IN AERS   Inhalation   Inhale 2 puffs into the lungs every 6 (six) hours as needed. For shortness of breath         . HYDROCODONE-ACETAMINOPHEN 5-500 MG PO TABS   Oral   Take 1 tablet by mouth 2 (two) times daily as needed. For pain         . OXYCODONE-ACETAMINOPHEN 5-325 MG PO TABS   Oral  Take 1-2 tablets by mouth every 4 (four) hours as needed for pain.   20 tablet   0     BP 109/57  Pulse 52  Temp 97.9 F (36.6 C) (Oral)  Resp 18  Ht 5\' 3"  (1.6 m)  Wt 205 lb (92.987 kg)  BMI 36.31 kg/m2  SpO2 100%  Physical Exam  Nursing note and vitals reviewed. Constitutional: She appears well-developed and  well-nourished.  HENT:  Head: Normocephalic.  Eyes: Conjunctivae normal are normal.  Neck: Normal range of motion. Neck supple.  Cardiovascular: Normal rate and intact distal pulses.        Pedal pulses normal.  Pulmonary/Chest: Effort normal.  Abdominal: Soft. Bowel sounds are normal. She exhibits no distension and no mass.  Musculoskeletal: Normal range of motion. She exhibits tenderness. She exhibits no edema.       Lumbar back: She exhibits tenderness. She exhibits no swelling, no edema and no spasm.       ttp across right paracervical and upper right posterior shoulder.  Tender to even light touch.  No rash,  No muscle spasm appreciated.    Neurological: She is alert. She has normal strength. She displays no atrophy and no tremor. No sensory deficit. Gait normal.  Reflex Scores:      Patellar reflexes are 2+ on the right side and 2+ on the left side.      Achilles reflexes are 2+ on the right side and 2+ on the left side.      No strength deficit noted in arms.  Equal grip strength.  Less than 3 sec cap refill.  Skin: Skin is warm and dry.  Psychiatric: She has a normal mood and affect.    ED Course  Procedures (including critical care time)  Labs Reviewed - No data to display No results found.   1. Shoulder pain, right       MDM  Symptoms suspicious for possible early shingles flare,  Although pt has no rash, no h/o shingles.  She was prescribed oxycodone in place of tramadol.  Also prescribed acyclovir,  But asked to hold this script and only get filled at first sign of rash outbreak.  Plan to f/u with pcp in 1 week if sx persist or worsen.    Patients labs and/or radiological studies were reviewed during the medical decision making and disposition process.         Burgess Amor, PA 06/23/12 1215

## 2012-06-23 NOTE — ED Provider Notes (Signed)
Medical screening examination/treatment/procedure(s) were performed by non-physician practitioner and as supervising physician I was immediately available for consultation/collaboration.  Raeford Razor, MD 06/23/12 1359

## 2012-07-27 ENCOUNTER — Ambulatory Visit (HOSPITAL_COMMUNITY)
Admission: RE | Admit: 2012-07-27 | Discharge: 2012-07-27 | Disposition: A | Discharge status: Home / Self Care | Payer: Medicare Other | Source: Ambulatory Visit | Attending: Podiatry | Admitting: Podiatry

## 2012-07-27 DIAGNOSIS — IMO0001 Reserved for inherently not codable concepts without codable children: Secondary | ICD-10-CM | POA: Insufficient documentation

## 2012-07-27 DIAGNOSIS — I10 Essential (primary) hypertension: Secondary | ICD-10-CM | POA: Insufficient documentation

## 2012-07-27 DIAGNOSIS — R262 Difficulty in walking, not elsewhere classified: Secondary | ICD-10-CM | POA: Insufficient documentation

## 2012-07-27 DIAGNOSIS — M6281 Muscle weakness (generalized): Secondary | ICD-10-CM | POA: Insufficient documentation

## 2012-07-27 DIAGNOSIS — M25579 Pain in unspecified ankle and joints of unspecified foot: Secondary | ICD-10-CM | POA: Insufficient documentation

## 2012-07-27 NOTE — Evaluation (Signed)
Physical Therapy Evaluation  Patient Details  Name: [name] MRN: [id number] Date of Birth: Nov 23, 1945  Today's Date: 07/27/2012 Time: 1000-1058 PT Time Calculation (min): 58 min Charge:  Eval; there ex x 10 Visit#: 1  of 12   Re-eval: 08/26/12 Assessment Diagnosis: Foot pain Next MD Visit:  (08/23/2012) Prior Therapy: none  Authorization: medicare [employer]  Authorization Time Period:    Authorization Visit#: 1  of 10    Past Medical History:  Past Medical History  Diagnosis Date  . Hypertension   . Hyperlipidemia   . Gastroesophageal reflux disease   . Diverticulosis   . Anxiety and depression   . Adenomatous colon polyp 2006    excised in 2006 & 2010Due surveillance 06/2013  . Chronic back pain   . Thyroid nodule   . Fibromyalgia   . Migraines   . Insomnia   . Chronic constipation   . Asthma   . History of benign esophageal tumor 2010    granular cell esophageal tumor (Dx 06/2008), resected via EMR 2011, due repeat EGD 02/2012  . Chest discomfort   . Hiatal hernia   . Depression   . Anxiety    Past Surgical History:  Past Surgical History  Procedure Date  . Tonsillectomy   . Abdominal hysterectomy   . Total knee arthroplasty 2002    Right; previous arthroscopic surgery  . Shoulder arthroscopy     Right; bone spurs removed  . Lung biopsy     negative  . 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 Right oophorectomy     benign disease  . Colonoscopy 06/2008, 06/2011    sigmoid tics, tubular adenoma; 2013: anal canal hemorrhoids  . Eus 08/2010    Dr [facility] with EGD. Retained food. No recurrent esophageal lesion, bx negative.  . Colonoscopy 07/10/2011    Anal canal hemorrhoids likely the cause of hematochezia in the setting of constipation; otherwise normal rectum ;submucosal  petechiae in left colon of doubtful clinical significance; otherwise, normal colon  . Esophagogastroduodenoscopy 12/11/2011    Procedure:  ESOPHAGOGASTRODUODENOSCOPY (EGD);  Surgeon: Corbin Ade, MD;  Location: [facility];  Service: Endoscopy;  Laterality: N/A;  11:00  . Bravo ph study 12/11/2011    Procedure: BRAVO PH STUDY;  Surgeon: Corbin Ade, MD;  Location: [facility];  Service: Endoscopy;  Laterality: N/A;    Subjective Symptoms/Limitations Symptoms: [name] states that she has been having foot pain for about six months.  She states the pain is progressive in nature and her left foot bothers her more than her right .   She stats that the pain started quickly.  She states the pain in her left foot is mainly one bottom of her foot along the medial aspect where the right foot the pain is mainly on the top of the foot.  She would describe the pain as stabbing.  She is currently on Tramadol which does not seem to help.  She states that they took away the Loratab.   How long can you stand comfortably?: She is able to stand for five minutes. How long can you walk comfortably?: The patient states that she wants to stop walking less than five minute. Pain Assessment Currently in Pain?: Yes Pain Score: 0-No pain (10/10) Pain Location: Foot Pain Orientation: Right Pain Type: Chronic pain Pain Onset: More than a month ago Pain Frequency: Intermittent Pain Relieving Factors: pain meds  Effect of Pain on  Daily Activities: increases.  Multiple Pain Sites: Yes  Precautions/Restrictions  Precautions Precautions: None Restrictions Weight Bearing Restrictions: No  Prior Functioning  Prior Function Vocation: Retired Leisure: Hobbies-yes (Comment) Comments: [facility].   Sensation/Coordination/Flexibility/Functional Tests Functional Tests Functional Tests: LEFS 25/80 = 31%  Assessment RLE AROM (degrees) Right Ankle Dorsiflexion: 0  Right Ankle Plantar Flexion: 60  Right Ankle Inversion: 25  Right Ankle Eversion: 15  RLE Strength Right Ankle Dorsiflexion:  (4-/5) Right Ankle Plantar Flexion:   (4-/5) Right Ankle Inversion:  (4-/5) Right Ankle Eversion:  (4-/5) LLE AROM (degrees) Left Ankle Dorsiflexion: 3  Left Ankle Plantar Flexion: 80  Left Ankle Inversion: 30  Left Ankle Eversion: 20  LLE Strength Left Ankle Dorsiflexion: 4/5 Left Ankle Plantar Flexion: 4/5 Left Ankle Inversion: 4/5 Left Ankle Eversion: 4/5  Exercise/Treatments Mobility/Balance  Static Standing Balance Single Leg Stance - Right Leg: 10  Single Leg Stance - Left Leg: 12    Ankle Stretches Gastroc Stretch: 3 reps;30 seconds   Ankle Exercises - Standing SLS:  (R 10 sec/ L 12)   Ankle Exercises - Supine T-Band:  (10 x all B)      Physical Therapy Assessment and Plan PT Assessment and Plan Clinical Impression Statement: Pt with decreased strength , decreeased ROM and pain who now has difficulty standing or walking for any significant period of time.  Pt will benefit from skilled PT to address deficitys and increase pt functional activity tolerance. Pt will benefit from skilled therapeutic intervention in order to improve on the following deficits: Decreased balance;Decreased range of motion;Decreased strength;Difficulty walking;Pain Rehab Potential: Good PT Frequency: Min 3X/week PT Duration: 4 weeks PT Treatment/Interventions: Gait training;Therapeutic exercise;Modalities;Manual techniques PT Plan: begin iontophoresis(if on prescription) if not requests and do manual to decrease pain; marble pick up B, Baps B level 3    Goals Home Exercise Program Pt will Perform Home Exercise Program: Independently PT Short Term Goals Time to Complete Short Term Goals: 2 weeks PT Short Term Goal 1: Pt to be able to stand for 15 minutes without difficulty PT Short Term Goal 2: Pt to be able to walk for 10 minutes without difficulty PT Short Term Goal 3: Pt pain to be no greater than a 6/10 PT Long Term Goals Time to Complete Long Term Goals: 4 weeks PT Long Term Goal 1: Pt to be I in advance HEP PT Long  Term Goal 2: Pt to be able to stand for 25 minutes without increased pain Long Term Goal 4: Pt to be able to walk for 25 mintues without diffculty PT Long Term Goal 5: Pt pain to be no greater than a 3/10  Problem List Patient Active Problem List  Diagnosis  . THYROID NODULE  . HYPERLIPIDEMIA  . Anxiety and depression  . HYPERTENSION  . Gastroesophageal reflux disease  . DEGENERATIVE DISC DISEASE, LUMBOSACRAL SPINE  . FIBROMYALGIA  . History of benign esophageal tumor  . Adenomatous colon polyp  . Insomnia due to mental disorder  . Difficulty in walking    General Behavior During Session: [facility] for tasks performed PT Plan of Care PT Home Exercise Plan: given  GP Functional Assessment Tool Used: LEFS Functional Limitation: Mobility: Walking and moving around Mobility: Walking and Moving Around Current Status (Z6109): At least 60 percent but less than 80 percent impaired, limited or restricted Mobility: Walking and Moving Around Goal Status [phone]): At least 20 percent but less than 40 percent impaired, limited or restricted  Jontae Adebayo,CINDY 07/27/2012, 12:09 PM  Physician Documentation Your signature is required to indicate approval of the treatment plan as stated above.  Please sign and either send electronically or make a copy of this report for your files and return this physician signed original.   Please mark one 1.__approve of plan  2. ___approve of plan with the following conditions.   ______________________________                                                          _____________________ Physician Signature                                                                                                             Date

## 2012-07-30 ENCOUNTER — Ambulatory Visit (HOSPITAL_COMMUNITY)
Admission: RE | Admit: 2012-07-30 | Discharge: 2012-07-30 | Disposition: A | Discharge status: Home / Self Care | Payer: Medicare Other | Source: Ambulatory Visit | Attending: Podiatry | Admitting: Podiatry

## 2012-07-30 DIAGNOSIS — R262 Difficulty in walking, not elsewhere classified: Secondary | ICD-10-CM

## 2012-07-30 NOTE — Progress Notes (Signed)
Physical Therapy Treatment Patient Details  Name: DELAYNE SANZO MRN: [id number] Date of Birth: 12-18-1945 Charge there ex 28;  Massage 10 Today's Date: 07/30/2012 Time: 1020-1100 PT Time Calculation (min): 40 min  Visit#: 2  of 12   Re-eval: 08/26/12   Authorization:   2 or 12  Authorization Visit#: 2  of 10    Subjective: Symptoms/Limitations Symptoms: Pt states that her feet are hurting equally today.  Pt had no difficulty with her exercises. Pain Assessment Currently in Pain?: Yes Pain Score:   4 Pain Location: Foot Pain Orientation: Right;Left Pain Type: Chronic pain     Exercise/Treatments   Ankle Exercises - Standing Rocker Board: 1 minute;Limitations Rocker Board Limitations: A/P; R/L Ankle Exercises - Seated Marble Pickup: 10 pt has container. BAPS: Standing;Level 3;Limitations BAPS Limitations: R/L; A/P and cirlces Manual Therapy Manual Therapy: Massage Massage: B plantar aspect of fot.  Physical Therapy Assessment and Plan PT Assessment and Plan Clinical Impression Statement: Pt able to complete new exercises with good form when verbally cued.  Therapist suggested pt may want to get orthotics to improve support . PT Plan: begin SLS; slant board stretch, heelraise/toeraise.   Will send order to MD for iontophoresis.  Begin ionto when order comes back. Goals    Problem List Patient Active Problem List  Diagnosis  . THYROID NODULE  . HYPERLIPIDEMIA  . Anxiety and depression  . HYPERTENSION  . Gastroesophageal reflux disease  . DEGENERATIVE DISC DISEASE, LUMBOSACRAL SPINE  . FIBROMYALGIA  . History of benign esophageal tumor  . Adenomatous colon polyp  . Insomnia due to mental disorder  . Difficulty in walking       GP    Maze Corniel,CINDY 07/30/2012, 1:13 PM

## 2012-07-31 ENCOUNTER — Other Ambulatory Visit: Payer: Self-pay | Admitting: Cardiology

## 2012-07-31 ENCOUNTER — Telehealth (HOSPITAL_COMMUNITY): Payer: Self-pay | Admitting: Psychiatry

## 2012-07-31 NOTE — Telephone Encounter (Signed)
Needs to schedule appointment to discuss her intolerance to Ambien and her continuing to take it.

## 2012-08-03 ENCOUNTER — Telehealth (HOSPITAL_COMMUNITY): Payer: Self-pay | Admitting: Psychiatry

## 2012-08-04 ENCOUNTER — Ambulatory Visit (INDEPENDENT_AMBULATORY_CARE_PROVIDER_SITE_OTHER): Payer: Medicare Other | Admitting: Psychiatry

## 2012-08-04 ENCOUNTER — Encounter (HOSPITAL_COMMUNITY): Payer: Self-pay | Admitting: Psychiatry

## 2012-08-04 VITALS — Wt 205.0 lb

## 2012-08-04 DIAGNOSIS — F339 Major depressive disorder, recurrent, unspecified: Secondary | ICD-10-CM

## 2012-08-04 DIAGNOSIS — F5105 Insomnia due to other mental disorder: Secondary | ICD-10-CM

## 2012-08-04 DIAGNOSIS — F419 Anxiety disorder, unspecified: Secondary | ICD-10-CM

## 2012-08-04 DIAGNOSIS — F32A Depression, unspecified: Secondary | ICD-10-CM

## 2012-08-04 DIAGNOSIS — F330 Major depressive disorder, recurrent, mild: Secondary | ICD-10-CM

## 2012-08-04 DIAGNOSIS — IMO0001 Reserved for inherently not codable concepts without codable children: Secondary | ICD-10-CM

## 2012-08-04 MED ORDER — GABAPENTIN 300 MG PO CAPS: ORAL_CAPSULE | ORAL | Status: DC

## 2012-08-04 MED ORDER — DULOXETINE HCL 60 MG PO CPEP: 60.0000 mg | ORAL_CAPSULE | Freq: Two times a day (BID) | ORAL | Status: DC

## 2012-08-04 MED ORDER — ZOLPIDEM TARTRATE 10 MG PO TABS: 10.0000 mg | ORAL_TABLET | Freq: Every evening | ORAL | Status: DC | PRN

## 2012-08-04 NOTE — Progress Notes (Addendum)
[facility] [id number] Progress Note [name] MRN: [id number] DOB: 06/10/1946 Age: 67 y.o.  Date: 08/04/2012 Start Time: 1:00 PM End Time: 1:22 PM  Chief Complaint: Chief Complaint  Patient presents with  . Depression  . Anxiety  . Follow-up  . Medication Refill    Subjective: "My holidays were okay.  I still get up and eat and do house work and not remember when I take the Ambien". Depression 1/10 and Anxiety 6/10, where 1 is the best and 10 is the worst.  Pain is 8/10.  History of presenting illness Patient came for her followup appointment.   Pt reports that she is compliant with the psychotropic medications with mixed benefit and no noticeable side effects.  The Neurontin is giving her good results.  The Ambien works is something she would like to find something else to help with for her sleep and she is not sure if the Cymbalta is helping at all.  She feels pretty sure that her memory problem is partially due to the Ambien.  She has tried Seroquel, Remeron, Trazodone, and Viastaril in the past with the best results with the Ambien. She notes dry mouth and a bad taste in her mouth.  Her insurance will only cover 2 pills of Cymbalta a day so will try 60 MG BID and see if that changes her anxiety adn depression and pain and or makes her mouth worse.  Current psychiatric medication Neurontin 300 mg 3 times a day prescribed by Dr. Harrel Carina.   Cymbalta 40 mg BID Ambien 10 mg at bedtime  Psychiatric history Patient has a long history of depression.  She has been seeing in this office since May 1997.  Patient denies any history of suicidal attempt however endorse chronic depression and anxiety.  In the past she has taken Prozac but did not like after taking few doses, she also has taken Seroquel, Abilify, Xanax, Pamelor, and amitriptyline.  Patient denies a history of paranoia or delusions however there are times when she has been very depressed and have a lot of negative  symptoms.  Alcohol and substance use history Patient denies any history of alcohol or substance use.  Medical history Patient has history of hypertension, hyperlipidemia, GERD, diverticulosis, colonic polyp, chronic back pain, fibromyalgia, chronic constipation, benign tumor of esophagus and chronic headache.  Her primary care physician is Dr. Felecia Shelling, she sees Dr. Harrel Carina for chronic pain, fibromyalgia and headache. Her cardiologist is Dr Mylo Red.  Family History family history includes Alcohol abuse in her father; Anxiety disorder in her mother; Colon cancer in her paternal aunt and paternal uncle; Dementia in her maternal uncle; Depression in her mother; Heart attack in her mother; and Stroke in her father.  There is no history of ADD / ADHD, and Bipolar disorder, and Drug abuse, and OCD, and Paranoid behavior, and Schizophrenia, and Seizures, and Sexual abuse, and Physical abuse, .  Mental status examination Patient is casually dressed and fairly groomed. She is calm cooperative and maintained fair eye contact. She described her mood is okay and her affect is mood appropriate.  Her speech is coherent but slow.  Her thought processes clear and logical. She denies any active or passive suicidal thoughts or homicidal thoughts. She denies any auditory or visual hallucination. There no psychotic symptoms present at this time. She's alert and oriented x3. Her insight judgment impulse control is okay  Lab Results: No results found for this or any previous visit (from the past 8736 hour(s)).  Her cardiologist draws her labs and reportedly there are no problems.  The last ones available to me show that her CMET was essentially WNL.  Assessment Axis I Maj. depressive disorder Axis II deferred Axis III see medical history Axis IV are to moderate.  Plan: I took her vitals.  I reviewed CC, tobacco/med/surg Hx, meds effects/ side effects, problem list, therapies and responses as well as current  situation/symptoms discussed options. See orders and pt instructions for more details.  Medical Decision Making Problem Points:  Established problem, worsening (2), Review of last therapy session (1) and Review of psycho-social stressors (1) Data Points:  Review or order clinical lab tests (1) Review of medication regiment & side effects (2) Review of new medications or change in dosage (2)  I certify that outpatient services furnished can reasonably be expected to improve the patient's condition.   Orson Aloe, MD, [facility]

## 2012-08-04 NOTE — Patient Instructions (Signed)
Call if problems or concerns.  

## 2012-08-04 NOTE — Telephone Encounter (Signed)
Phone message completed in the phone message section.  

## 2012-08-05 ENCOUNTER — Inpatient Hospital Stay (HOSPITAL_COMMUNITY): Admission: RE | Admit: 2012-08-05 | Payer: Self-pay | Source: Ambulatory Visit | Admitting: Physical Therapy

## 2012-08-07 ENCOUNTER — Inpatient Hospital Stay (HOSPITAL_COMMUNITY): Admission: RE | Admit: 2012-08-07 | Payer: Self-pay | Source: Ambulatory Visit

## 2012-08-10 ENCOUNTER — Telehealth (HOSPITAL_COMMUNITY): Payer: Self-pay | Admitting: Psychiatry

## 2012-08-10 DIAGNOSIS — F5105 Insomnia due to other mental disorder: Secondary | ICD-10-CM

## 2012-08-10 MED ORDER — TRAZODONE HCL 50 MG PO TABS: 50.0000 mg | ORAL_TABLET | Freq: Every day | ORAL | Status: DC

## 2012-08-10 NOTE — Telephone Encounter (Signed)
Discussed the notice from the insurance about her only getting 90 Ambien per calendar year.  She is willing to try Trazodone.   Will try until her next appointment.

## 2012-08-11 ENCOUNTER — Ambulatory Visit (HOSPITAL_COMMUNITY)
Admission: RE | Admit: 2012-08-11 | Discharge: 2012-08-11 | Disposition: A | Discharge status: Home / Self Care | Payer: Medicare Other | Source: Ambulatory Visit | Attending: Podiatry | Admitting: Podiatry

## 2012-08-11 NOTE — Progress Notes (Signed)
Physical Therapy Treatment Patient Details  Name: ARLISA LECLERE MRN: [id number] Date of Birth: [birth year]  Today's Date: 08/11/2012 Time: 1020-1115 PT Time Calculation (min): 55 min  Visit#: 3 of 12  Re-eval: 08/26/12 Charges: Therex x 20' Ionto x 20'  Authorization: medicare [employer]  Authorization Visit#: 3 of 10   Subjective: Symptoms/Limitations Symptoms: Pt states that her pain comes and goes. Pain Assessment Currently in Pain?: Yes Pain Score:   5 Pain Location: Foot Pain Orientation: Right;Left   Exercise/Treatments Ankle Exercises - Standing BAPS: Level 3;10 reps;Standing;Limitations BAPS Limitations: Bilateral Rocker Board: 2 minutes Rocker Board Limitations: A/P; R/L  Modalities Modalities: Iontophoresis Iontophoresis Type of Iontophoresis: Dexamethasone Location: B medial dorsal foot Dose: 2 cc  Time: 20'  Physical Therapy Assessment and Plan PT Assessment and Plan Clinical Impression Statement: Pt displays decreased ankle strategy and control. Pt requires multimodal cueing to properly complete BAPS board. Began iontophoresis to decrease pain. PT Plan: Begin SLS; slant board stretch, heelraise/toeraise per PT POC.     Problem List Patient Active Problem List  Diagnosis  . THYROID NODULE  . HYPERLIPIDEMIA  . Anxiety and depression  . HYPERTENSION  . Gastroesophageal reflux disease  . DEGENERATIVE DISC DISEASE, LUMBOSACRAL SPINE  . FIBROMYALGIA  . History of benign esophageal tumor  . Adenomatous colon polyp  . Insomnia due to mental disorder  . Difficulty in walking    General Behavior During Session: [facility] for tasks performed Cognition: [facility] for tasks performed  Seth Bake, PTA  08/11/2012, 12:24 PM

## 2012-08-13 ENCOUNTER — Ambulatory Visit (HOSPITAL_COMMUNITY)
Admission: RE | Admit: 2012-08-13 | Discharge: 2012-08-13 | Disposition: A | Discharge status: Home / Self Care | Payer: Medicare Other | Source: Ambulatory Visit | Attending: Podiatry | Admitting: Podiatry

## 2012-08-13 NOTE — Progress Notes (Signed)
Physical Therapy Treatment Patient Details  Name: [name] MRN: [id number] Date of Birth: 1946/01/07  Today's Date: 08/13/2012 Time: 1030-1120 PT Time Calculation (min): 50 min  Visit#: 4 of 12  Re-eval: 08/26/12 Charges: Therex x 20' Ionto x 25'  Authorization: medicare [employer]  Authorization Visit#: 4 of 10   Subjective: Symptoms/Limitations Symptoms: Pt reports decreased pain after ionto tx. Pain Assessment Currently in Pain?: Yes Pain Score:   3 Pain Location: Foot Pain Orientation: Right;Left   Exercise/Treatments Ankle Stretches Slant Board Stretch: 2 reps;30 seconds Ankle Exercises - Standing BAPS: Level 3;10 reps;Standing;Limitations BAPS Limitations: Bilateral Rocker Board: 2 minutes Rocker Board Limitations: A/P; R/L  Modalities Modalities: Iontophoresis Iontophoresis Type of Iontophoresis: Dexamethasone Location: B medial dorsal foot Dose: 2 cc  Time: 25'  Physical Therapy Assessment and Plan PT Assessment and Plan Clinical Impression Statement: Pt displays improved ankle strategy and control compared to last session. Pt requires decreased cueing for exercises. Began gastroc stretch on slant board with minimal difficulty. Ionto continues secondary to decrease pain with last session. PT Plan: Begin SLS, heelraise/toeraise per PT POC.     Problem List Patient Active Problem List  Diagnosis  . THYROID NODULE  . HYPERLIPIDEMIA  . Anxiety and depression  . HYPERTENSION  . Gastroesophageal reflux disease  . DEGENERATIVE DISC DISEASE, LUMBOSACRAL SPINE  . FIBROMYALGIA  . History of benign esophageal tumor  . Adenomatous colon polyp  . Insomnia due to mental disorder  . Difficulty in walking    General Behavior During Session: [facility] for tasks performed Cognition: [facility] for tasks performed  Seth Bake, PTA  08/13/2012, 12:05 PM

## 2012-08-18 ENCOUNTER — Ambulatory Visit (HOSPITAL_COMMUNITY)
Admission: RE | Admit: 2012-08-18 | Discharge: 2012-08-18 | Disposition: A | Discharge status: Home / Self Care | Payer: Medicare Other | Source: Ambulatory Visit | Attending: Podiatry | Admitting: Podiatry

## 2012-08-18 NOTE — Progress Notes (Signed)
Physical Therapy Treatment Patient Details  Name: [name] MRN: [id number] Date of Birth: [birth year]  Today's Date: 08/18/2012 Time: 1025-1122 PT Time Calculation (min): 57 min  Visit#: 5 of 12  Re-eval: 08/26/12 Authorization: medicare [employer]  Authorization Visit#: 5 of 10  Charges:  therex 28', ionto X 2 units  Subjective: Symptoms/Limitations Symptoms: Pt. reports both her feet have been swelling; reports overall decrease in pain and currently 0/10 B feet. Pain Assessment Currently in Pain?: No/denies   Exercise/Treatments Ankle Stretches Slant Board Stretch: 2 reps;30 seconds Ankle Exercises - Standing BAPS: Level 3;10 reps;Standing;Limitations BAPS Limitations: Bilateral CW/CCW SLS: L: 15", R: 18" Rocker Board: 2 minutes Rocker Board Limitations: A/P; R/L Heel Raises: 10 reps Toe Raise: 10 reps   Modalities Modalities: Iontophoresis Manual Therapy Massage: B plantar aspect of each foot with elevation Iontophoresis Type of Iontophoresis: Dexamethasone Location: B medial dorsal foot Dose: 2 cc  Time: 25'  Physical Therapy Assessment and Plan PT Assessment and Plan Clinical Impression Statement: Pt. hypersensitive to iontophoresis, having to increase slowly.  Added SLS, heel and toe raises without difficulty.  Pt. requires tactile cues to isolate ankle strategy with BAPS board.   Progressing well with overall pain reduction. PT Plan: Continue per POC, elevate LE's with iontophoresis.    Problem List Patient Active Problem List  Diagnosis  . THYROID NODULE  . HYPERLIPIDEMIA  . Anxiety and depression  . HYPERTENSION  . Gastroesophageal reflux disease  . DEGENERATIVE DISC DISEASE, LUMBOSACRAL SPINE  . FIBROMYALGIA  . History of benign esophageal tumor  . Adenomatous colon polyp  . Insomnia due to mental disorder  . Difficulty in walking    General Behavior During Session: [facility] for tasks performed Cognition: [facility] for tasks performed   Lurena Nida, PTA/CLT 08/18/2012, 11:03 AM

## 2012-08-20 ENCOUNTER — Inpatient Hospital Stay (HOSPITAL_COMMUNITY): Admission: RE | Admit: 2012-08-20 | Payer: Self-pay | Source: Ambulatory Visit | Admitting: Physical Therapy

## 2012-08-24 ENCOUNTER — Ambulatory Visit (HOSPITAL_COMMUNITY)
Admission: RE | Admit: 2012-08-24 | Discharge: 2012-08-24 | Disposition: A | Discharge status: Home / Self Care | Payer: Medicare Other | Source: Ambulatory Visit | Attending: Internal Medicine | Admitting: Internal Medicine

## 2012-08-24 DIAGNOSIS — M25579 Pain in unspecified ankle and joints of unspecified foot: Secondary | ICD-10-CM | POA: Insufficient documentation

## 2012-08-24 DIAGNOSIS — R262 Difficulty in walking, not elsewhere classified: Secondary | ICD-10-CM | POA: Insufficient documentation

## 2012-08-24 DIAGNOSIS — IMO0001 Reserved for inherently not codable concepts without codable children: Secondary | ICD-10-CM | POA: Insufficient documentation

## 2012-08-24 DIAGNOSIS — M6281 Muscle weakness (generalized): Secondary | ICD-10-CM | POA: Insufficient documentation

## 2012-08-24 DIAGNOSIS — I10 Essential (primary) hypertension: Secondary | ICD-10-CM | POA: Insufficient documentation

## 2012-08-24 NOTE — Progress Notes (Signed)
Physical Therapy Treatment Patient Details  Name: [name] MRN: [id number] Date of Birth: [birth year]  Today's Date: 08/24/2012 Time: 1020-1110 PT Time Calculation (min): 50 min  Visit#: 6 of 12  Re-eval: 08/26/12 Charges: Therex x 23' Ionto x 23'  Authorization: medicare [employer]   Authorization Visit#: 6 of 10   Subjective: Symptoms/Limitations Symptoms: Pt states that overall her pain has decreased in both feet. Pain Assessment Currently in Pain?: Yes Pain Score:   3 Pain Location: Foot Pain Orientation: Left   Exercise/Treatments Ankle Exercises - Standing BAPS: Level 3;10 reps;Standing;Limitations BAPS Limitations: All directions Rocker Board: 2 minutes Rocker Board Limitations: A/P; R/L Heel Raises: 15 reps Toe Raise: 15 reps Heel Walk (Round Trip): 1 RT Toe Walk (Round Trip): 1 RT  Modalities Modalities: Iontophoresis Iontophoresis Type of Iontophoresis: Dexamethasone Location: B medial dorsal foot Dose: 2 cc  Time: 23'  Physical Therapy Assessment and Plan PT Assessment and Plan Clinical Impression Statement: Pt presents with improved ankle strategy on BAPS but continues to requires vc's for proper technique. Began heel walk and toe walk to improve strength and stability. PT continues to be sensitive to iontophoresis. Pt's pain appears to be gradually decreasing.  PT Plan: Continue to progress ankle strenth and decrease pain per PT POC.     Problem List Patient Active Problem List  Diagnosis  . THYROID NODULE  . HYPERLIPIDEMIA  . Anxiety and depression  . HYPERTENSION  . Gastroesophageal reflux disease  . DEGENERATIVE DISC DISEASE, LUMBOSACRAL SPINE  . FIBROMYALGIA  . History of benign esophageal tumor  . Adenomatous colon polyp  . Insomnia due to mental disorder  . Difficulty in walking    General Behavior During Session: [facility] for tasks performed Cognition: [facility] for tasks performed  Seth Bake, PTA  08/24/2012, 11:41 AM

## 2012-08-26 ENCOUNTER — Inpatient Hospital Stay (HOSPITAL_COMMUNITY): Admission: RE | Admit: 2012-08-26 | Payer: Self-pay | Source: Ambulatory Visit

## 2012-08-28 ENCOUNTER — Inpatient Hospital Stay (HOSPITAL_COMMUNITY): Admission: RE | Admit: 2012-08-28 | Payer: Self-pay | Source: Ambulatory Visit

## 2012-08-31 ENCOUNTER — Ambulatory Visit (HOSPITAL_COMMUNITY)
Admission: RE | Admit: 2012-08-31 | Discharge: 2012-08-31 | Disposition: A | Discharge status: Home / Self Care | Payer: Medicare Other | Source: Ambulatory Visit | Attending: Internal Medicine | Admitting: Internal Medicine

## 2012-08-31 ENCOUNTER — Ambulatory Visit (INDEPENDENT_AMBULATORY_CARE_PROVIDER_SITE_OTHER): Payer: Medicare Other | Admitting: Psychiatry

## 2012-08-31 ENCOUNTER — Encounter (HOSPITAL_COMMUNITY): Payer: Self-pay | Admitting: Psychiatry

## 2012-08-31 VITALS — Wt 206.2 lb

## 2012-08-31 DIAGNOSIS — F32A Depression, unspecified: Secondary | ICD-10-CM

## 2012-08-31 DIAGNOSIS — IMO0001 Reserved for inherently not codable concepts without codable children: Secondary | ICD-10-CM

## 2012-08-31 DIAGNOSIS — F5105 Insomnia due to other mental disorder: Secondary | ICD-10-CM

## 2012-08-31 DIAGNOSIS — F329 Major depressive disorder, single episode, unspecified: Secondary | ICD-10-CM

## 2012-08-31 DIAGNOSIS — F330 Major depressive disorder, recurrent, mild: Secondary | ICD-10-CM

## 2012-08-31 MED ORDER — ZOLPIDEM TARTRATE 10 MG PO TABS: 10.0000 mg | ORAL_TABLET | Freq: Every evening | ORAL | Status: DC | PRN

## 2012-08-31 MED ORDER — DULOXETINE HCL 60 MG PO CPEP: 60.0000 mg | ORAL_CAPSULE | Freq: Two times a day (BID) | ORAL | Status: DC

## 2012-08-31 NOTE — Progress Notes (Signed)
[facility] [id number] Progress Note [name] MRN: [id number] DOB: 20-Oct-1945 Age: 67 y.o.  Date: 08/31/2012 Start Time: 10:30 AM End Time: 11:00AM  Chief Complaint: Chief Complaint  Patient presents with  . Anxiety  . Depression  . Follow-up  . Medication Refill    Subjective: "I sleep much better on the Ambien and the Trazodone cause me to remember some things I didn't want to.  I would rather pay for the Ambien myself it that would be okay". Depression 0/10 and Anxiety 8/10, where 1 is the best and 10 is the worst.  Pain is 10/10.  The Vicodan does not seem to work and the [employer] only helps for a few hours and the pain come right back.  History of presenting illness Patient came for her followup appointment.   Pt reports that she is compliant with the psychotropic medications with mixed benefit and some side effects.  The trazodone caused the above dreams.  Her sleep is best on the Ambien and she would rather pay for it herself since the insurance is not wanting to give her more than 90 tabs a year.  Discussed the options of Minipress and Seroquel.  Seroquel did not help her sleep that much.  She chooses to have a script of both to try the Minipress first and then have an Ambien script to fall back on.  In consulting with her pharmacist it is ill advised to be on both Hytrin and Minipress.  Will give her that medication name and have her discuss a switch to minipress from Hytrin with the prescriber of that medication.  She manages to walk to the mail box each day.  She says that she hurts too much to do much activity at all.    Current psychiatric medication Neurontin 300 mg 3 times a day prescribed by Dr. Harrel Carina.   Cymbalta 60 mg BID Ambien 10 mg at bedtime  Psychiatric history Patient has a long history of depression.  She has been seeing in this office since May 1997.  Patient denies any history of suicidal attempt however endorse chronic depression and anxiety.   In the past she has taken Prozac but did not like after taking few doses, she also has taken Seroquel, Abilify, Xanax, Pamelor, and amitriptyline.  Patient denies a history of paranoia or delusions however there are times when she has been very depressed and have a lot of negative symptoms.  Alcohol and substance use history Patient denies any history of alcohol or substance use.  Medical history Patient has history of hypertension, hyperlipidemia, GERD, diverticulosis, colonic polyp, chronic back pain, fibromyalgia, chronic constipation, benign tumor of esophagus and chronic headache.  Her primary care physician is Dr. Felecia Shelling, she sees Dr. Harrel Carina for chronic pain, fibromyalgia and headache. Her cardiologist is Dr Mylo Red.  Family History family history includes Alcohol abuse in her father; Anxiety disorder in her mother; Colon cancer in her paternal aunt and paternal uncle; Dementia in her maternal uncle; Depression in her mother; Heart attack in her mother; and Stroke in her father.  There is no history of ADD / ADHD, and Bipolar disorder, and Drug abuse, and OCD, and Paranoid behavior, and Schizophrenia, and Seizures, and Sexual abuse, and Physical abuse, .  Mental status examination Patient is casually dressed and fairly groomed. She is calm cooperative and maintained fair eye contact. She described her mood is okay and her affect is mood appropriate.  Her speech is coherent but slow.  Her thought  processes clear and logical. She denies any active or passive suicidal thoughts or homicidal thoughts. She denies any auditory or visual hallucination. There no psychotic symptoms present at this time. She's alert and oriented x3. Her insight judgment impulse control is okay  Lab Results:  Results for orders placed in visit on 01/21/12 (from the past 8736 hour(s))  COMPREHENSIVE METABOLIC PANEL   Collection Time    01/21/12 11:59 AM      Result Value Range   Sodium 141  135 - 145 mEq/L   Potassium  3.0 (*) 3.5 - 5.3 mEq/L   Chloride 104  96 - 112 mEq/L   CO2 28  19 - 32 mEq/L   Glucose, Bld 85  70 - 99 mg/dL   BUN 10  6 - 23 mg/dL   Creat 0.86  5.78 - 4.69 mg/dL   Total Bilirubin 0.3  0.3 - 1.2 mg/dL   Alkaline Phosphatase 67  39 - 117 U/L   AST 18  0 - 37 U/L   ALT 11  0 - 35 U/L   Total Protein 6.1  6.0 - 8.3 g/dL   Albumin 4.0  3.5 - 5.2 g/dL   Calcium 62.9  8.4 - 52.8 mg/dL   Her cardiologist draws her labs and reportedly there are no problems.  The last ones available to me still show that her CMET was essentially WNL.  She has had Vitamin D levels drawn the last was in July when it was determined that she no longer needed it.   Assessment Axis I Maj. depressive disorder Axis II deferred Axis III see medical history Axis IV are to moderate.  Plan: I took her vitals.  I reviewed CC, tobacco/med/surg Hx, meds effects/ side effects, problem list, therapies and responses as well as current situation/symptoms discussed options. Continue current effective medications.  Encourage some extra walking by timer each day.  See orders and pt instructions for more details.  Medical Decision Making Problem Points:  Established problem, worsening (2), Review of last therapy session (1) and Review of psycho-social stressors (1) Data Points:  Review or order clinical lab tests (1) Review of medication regiment & side effects (2)  I certify that outpatient services furnished can reasonably be expected to improve the patient's condition.   Orson Aloe, MD, [facility]

## 2012-08-31 NOTE — Progress Notes (Signed)
Physical Therapy Treatment Patient Details  Name: [name] MRN: [id number] Date of Birth: 1945/08/22  Today's Date: 08/31/2012 Time: 1302-1400 PT Time Calculation (min): 58 min   Visit#: 7 of 12  Re-eval: 08/26/12 Charges: Therex x 25' Ionto x 25'  Authorization: medicare [employer]  Authorization Visit#: 7 of 10   Subjective: Symptoms/Limitations Symptoms: Pt states that she was in bed all weekend with a headache. Pain Assessment Currently in Pain?: Yes Pain Score:   7 Pain Location: Foot  Exercise/Treatments Ankle Exercises - Standing BAPS: Level 3;10 reps;Standing;Limitations BAPS Limitations: All directions Rocker Board: 2 minutes Rocker Board Limitations: A/P; R/L Heel Walk (Round Trip): 1 RT Toe Walk (Round Trip): 1 RT   Modalities Modalities: Iontophoresis Manual Therapy Manual Therapy: Massage Massage: STM to distal anterior tibialis to decrease tightness and spasms Iontophoresis Type of Iontophoresis: Dexamethasone Location: B distal anterior tibialis Dose: 2 cc  Time: 25'  Physical Therapy Assessment and Plan PT Assessment and Plan Clinical Impression Statement: Last iontophoresis tx completed this session. Inflammation noted at distal anterior tibialis. This area is also very sensitive to the touch. STM completed to area to decrease tightness and spasms. Pt completed standing therex well with improved ankle strategy. Pt reports 0/10 pain in L foot and 1/10 pain in R foot at end of session. PT Plan: Continue to progress ankle strenth and decrease pain per PT POC. Assess need for pulsed [place] to B distal anterior tibialis.     Problem List Patient Active Problem List  Diagnosis  . THYROID NODULE  . HYPERLIPIDEMIA  . Anxiety and depression  . HYPERTENSION  . Gastroesophageal reflux disease  . DEGENERATIVE DISC DISEASE, LUMBOSACRAL SPINE  . FIBROMYALGIA  . History of benign esophageal tumor  . Adenomatous colon polyp  . Insomnia due to mental disorder   . Difficulty in walking    General Behavior During Session: [facility] for tasks performed Cognition: [facility] for tasks performed  Seth Bake, PTA 08/31/2012, 2:37 PM

## 2012-08-31 NOTE — Patient Instructions (Signed)
Set a timer for 2 to 3 minutes and walk for that amount of time in the house or in the yard.  Mark "2" or "3" on a calendar for that day.  Do that every day this week.  Then next week or so increase the time by one minute and then mark the calendar with the number of minutes that you  walked for that day.  Each week increase your exercise by one minute.  Keep a record of this so you can see what progress you are making.  Do this every day, just like eating and sleeping.  It is good for pain control, depression, and for your soul/spirit.  Bring the record in for your next visit so we can talk about your effort and how you feel with the new exercise program going and working for you. You can do a combination of holding legs up and at an angle and then gradually increase the overall activity.   Call if problems or concerns.

## 2012-09-01 ENCOUNTER — Telehealth (HOSPITAL_COMMUNITY): Payer: Self-pay | Admitting: Psychiatry

## 2012-09-01 DIAGNOSIS — IMO0001 Reserved for inherently not codable concepts without codable children: Secondary | ICD-10-CM

## 2012-09-01 DIAGNOSIS — F32A Depression, unspecified: Secondary | ICD-10-CM

## 2012-09-01 DIAGNOSIS — F5105 Insomnia due to other mental disorder: Secondary | ICD-10-CM

## 2012-09-01 MED ORDER — GABAPENTIN 300 MG PO CAPS: ORAL_CAPSULE | ORAL | Status: DC

## 2012-09-01 NOTE — Telephone Encounter (Signed)
Refill request approved via eScripts.  

## 2012-09-02 ENCOUNTER — Ambulatory Visit (HOSPITAL_COMMUNITY)
Admission: RE | Admit: 2012-09-02 | Discharge: 2012-09-02 | Disposition: A | Discharge status: Home / Self Care | Payer: Medicare Other | Source: Ambulatory Visit | Attending: Internal Medicine | Admitting: Internal Medicine

## 2012-09-02 ENCOUNTER — Telehealth (HOSPITAL_COMMUNITY): Payer: Self-pay | Admitting: Psychiatry

## 2012-09-02 DIAGNOSIS — F5105 Insomnia due to other mental disorder: Secondary | ICD-10-CM

## 2012-09-02 DIAGNOSIS — IMO0001 Reserved for inherently not codable concepts without codable children: Secondary | ICD-10-CM

## 2012-09-02 DIAGNOSIS — F32A Depression, unspecified: Secondary | ICD-10-CM

## 2012-09-02 MED ORDER — GABAPENTIN 300 MG PO CAPS: ORAL_CAPSULE | ORAL | Status: DC

## 2012-09-02 NOTE — Progress Notes (Signed)
Physical Therapy Treatment Patient Details  Name: [name] MRN: [id number] Date of Birth: 1946/03/16  Today's Date: 09/02/2012 Time: 1021-1120 PT Time Calculation (min): 59 min  Visit#: 8 of 12  Re-eval: 08/26/12 Authorization: medicare [employer]  Authorization Visit#: 8 of 10  Charges:  therex 30', manual 8', ionto Bilateral AT insertion X 1  Subjective: Symptoms/Limitations Symptoms: Pt. states she is not hurting at all today in her ankles.  Reports some mild discomfort in Lt. lateral hip (ITB area) but states that is common for her. Pain Assessment Currently in Pain?: No/denies   Exercise/Treatments Ankle Exercises - Standing BAPS: Level 3;15 reps;Standing;Limitations BAPS Limitations: All directions Rocker Board: 2 minutes Rocker Board Limitations: A/P; R/L Heel Walk (Round Trip): 1 RT Toe Walk (Round Trip): 1 RT    Modalities Modalities: Iontophoresis Manual Therapy Manual Therapy: Massage Massage: STM to distal AT bilaterally to decrease tightness and spasms Iontophoresis Type of Iontophoresis: Dexamethasone Location: B distal anterior tibialis Dose: 2 cc  Time: 25'  Physical Therapy Assessment and Plan PT Assessment and Plan Clinical Impression Statement: Overall improved without pain today.  Pt. does report some discomfort in Lt ITB area but states it hurts from time to time.  Less sensitivity to touch with STM today with overall less swelling and spasms noted.  Lt. continues to be worse than Rt.  Pt had one ionto treatement remaining per 6 treatment POC. PT Plan: Continue to progress ankle strenth and decrease pain per PT POC. Assess need for pulsed [place] to B distal anterior tibialis if pain returns as ionto is discontinued.     Problem List Patient Active Problem List  Diagnosis  . THYROID NODULE  . HYPERLIPIDEMIA  . Anxiety and depression  . HYPERTENSION  . Gastroesophageal reflux disease  . DEGENERATIVE DISC DISEASE, LUMBOSACRAL SPINE  . FIBROMYALGIA   . History of benign esophageal tumor  . Adenomatous colon polyp  . Insomnia due to mental disorder  . Difficulty in walking    General Behavior During Session: [name] Cognition: [name]    Lurena Nida, PTA/CLT 09/02/2012, 11:15 AM

## 2012-09-02 NOTE — Telephone Encounter (Signed)
Refill request approved via eScripts.  

## 2012-09-04 ENCOUNTER — Ambulatory Visit (HOSPITAL_COMMUNITY)
Admission: RE | Admit: 2012-09-04 | Discharge: 2012-09-04 | Disposition: A | Discharge status: Home / Self Care | Payer: Medicare Other | Source: Ambulatory Visit | Attending: Internal Medicine | Admitting: Internal Medicine

## 2012-09-04 NOTE — Progress Notes (Signed)
Physical Therapy Treatment Patient Details  Name: [name] MRN: [id number] Date of Birth: 1945-10-06  Today's Date: 09/04/2012 Time: 1005-1058 PT Time Calculation (min): 53 min  Visit#: 9 of 12  Re-eval: 09/11/12  charge there ex x 53'  Authorization: medicare [employer]  Authorization Time Period:    Authorization Visit#: 9 of 10   Subjective:  Pt states she has no pain.  Completing exercises at home.    Exercise/Treatments  Ankle Stretches Plantar Fascia Stretch: 3 reps;30 seconds Gastroc Stretch: 3 reps;30 seconds Slant Board Stretch: 3 reps;30 seconds   Ankle Exercises - Standing BAPS: Level 3;10 reps BAPS Limitations: all directions Rocker Board: 2 minutes Rocker Board Limitations: A/P; R/L Heel Walk (Round Trip): 1 RT Toe Walk (Round Trip): D/C as tightens gastroc Ankle Exercises - Seated Other Seated Ankle Exercises: Ankle dorsiflexion 4# x 10 Ankle Exercises - Sidelying Ankle Inversion: Right;Left;10 reps;Weights Ankle Inversion Weights (lbs): 4 Ankle Eversion: Right;Left;10 reps;Weights Ankle Eversion Weights (lbs): 4    Physical Therapy Assessment and Plan PT Assessment and Plan Clinical Impression Statement: Pt continues to have decreased strength;  add ROM with wt to improve.  Therapist discontinued toe walk as this contracts gastroc for extended time increasing tightness.  Pt had no pain at end of session today. Pt will benefit from skilled therapeutic intervention in order to improve on the following deficits: Decreased balance;Decreased range of motion;Decreased strength;Difficulty walking;Pain Rehab Potential: Good PT Plan: LEFS to be completed next treatment.  Check to see if pt is complteting t-band exercises at home .    Goals Home Exercise Program Pt will Perform Home Exercise Program: Independently PT Goal: Perform Home Exercise Program - Progress: Met PT Short Term Goals PT Short Term Goal 1: Pt to be able to stand for 15 minutes without  difficulty (due to back not feet.) PT Short Term Goal 1 - Progress: Not met PT Short Term Goal 2: Pt to be able to walk for 10 minutes without difficulty PT Short Term Goal 2 - Progress: Progressing toward goal PT Short Term Goal 3: Pt pain to be no greater than a 6/10 PT Short Term Goal 3 - Progress: Progressing toward goal PT Long Term Goals PT Long Term Goal 1: Pt to be I in advance HEP PT Long Term Goal 1 - Progress: Met PT Long Term Goal 2: Pt to be able to stand for 25 minutes without increased pain PT Long Term Goal 2 - Progress: Not met Long Term Goal 3 Progress: Not met Long Term Goal 4: Pt to be able to walk for 25 mintues without diffculty PT Long Term Goal 5: Pt pain to be no greater than a 3/10 Long Term Goal 5 Progress: Not met  Problem List Patient Active Problem List  Diagnosis  . THYROID NODULE  . HYPERLIPIDEMIA  . Anxiety and depression  . HYPERTENSION  . Gastroesophageal reflux disease  . DEGENERATIVE DISC DISEASE, LUMBOSACRAL SPINE  . FIBROMYALGIA  . History of benign esophageal tumor  . Adenomatous colon polyp  . Insomnia due to mental disorder  . Difficulty in walking    General Behavior During Session: [facility] for tasks performed Cognition: [facility] for tasks performed  GP    RUSSELL,[name] 09/04/2012, 11:07 AM

## 2012-09-08 ENCOUNTER — Inpatient Hospital Stay (HOSPITAL_COMMUNITY): Admission: RE | Admit: 2012-09-08 | Payer: Self-pay | Source: Ambulatory Visit

## 2012-09-09 ENCOUNTER — Ambulatory Visit (HOSPITAL_COMMUNITY)
Admission: RE | Admit: 2012-09-09 | Discharge: 2012-09-09 | Disposition: A | Discharge status: Home / Self Care | Payer: Medicare Other | Source: Ambulatory Visit | Attending: Internal Medicine | Admitting: Internal Medicine

## 2012-09-09 NOTE — Evaluation (Signed)
Physical Therapy Re-evaluation / discharge  Patient Details  Name: [name] MRN: [id number] Date of Birth: [birth year]  Today's Date: 09/09/2012 Time: 4098-1191 PT Time Calculation (min): 34 min       Visit#: 10 of 12  Authorization: medicare [employer]    Authorization Visit#: 10 of 10  Charges:  MMT, ROM testing, PPT  Subjective Symptoms/Limitations Symptoms: Pt states she has not had any pain since last week.  States an overall improvement of 80%.  Pt. voiced feels she can continue HEP on her own. Pain Assessment Currently in Pain?: No/denies   Objective: Functional Tests Functional Tests: LEFS 46/80 = 42% (was 25/80 69%)  RLE AROM (degrees) Right Ankle Dorsiflexion: 0 Right Ankle Plantar Flexion: 40 Right Ankle Inversion: 40 Right Ankle Eversion: 20  RLE Strength Right Ankle Dorsiflexion: 5/5 Right Ankle Plantar Flexion: 5/5 Right Ankle Inversion: 5/5 Right Ankle Eversion: 5/5  LLE AROM (degrees) Left Ankle Dorsiflexion: 0 Left Ankle Plantar Flexion: 40 Left Ankle Inversion: 30 Left Ankle Eversion: 20  LLE Strength Left Ankle Dorsiflexion: 5/5 Left Ankle Plantar Flexion: 5/5 Left Ankle Inversion: 5/5 Left Ankle Eversion: 5/5   Physical Therapy Assessment and Plan PT Assessment and Plan Clinical Impression Statement: Pt. with overall improvement with B ankle ROM and strength now [facility].  Pt has improved 27 points on LEFS and has met all goals at initial evaluation applicable to her individual goals.  Pt. states her only functional limitations now are due to her B knee dysfunction and pain and is to see an orthopedist regarding this.  Pt is independent an compliant with HEP and feels she is ready for discharge to HEP.   PT Plan: Discharge to HEP as all goals met.    Goals PT Short Term Goals Pt will Perform Home Exercise Program: Independently:  Progress:  MET PT Short Term Goal 1: Pt to be able to stand for 15 minutes without difficulty Progress: N/A  per  patient not a goal; has not attempted  PT Short Term Goal 2: Pt to be able to walk for 10 minutes without difficulty:  Progress: Met PT Short Term Goal 3: Pt pain to be no greater than a 6/10:  Progress:  Met  PT Long Term Goals PT Long Term Goal 1: Pt to be I in advance HEP Progress: Met PT Long Term Goal 2: Pt to be able to stand for 25 minutes without increased pain:  Progress: n/a  (Pt has not tried and is not a functional goal of patients) PT Long Term Goal 3: Pt to be able to walk for 25 mintues without diffculty  : Progress: Met PT Long Term Goal 4: Pt pain to be no greater than a 3/10 :  Progress: Met  Problem List Patient Active Problem List  Diagnosis  . THYROID NODULE  . HYPERLIPIDEMIA  . Anxiety and depression  . HYPERTENSION  . Gastroesophageal reflux disease  . DEGENERATIVE DISC DISEASE, LUMBOSACRAL SPINE  . FIBROMYALGIA  . History of benign esophageal tumor  . Adenomatous colon polyp  . Insomnia due to mental disorder  . Difficulty in walking    General Behavior During Session: [facility] for tasks performed Cognition: [facility] for tasks performed  GP Functional Assessment Tool Used: LEFS Functional Limitation: Mobility: Walking and moving around Mobility: Walking and Moving Around Current Status [phone]): At least 20 percent but less than 40 percent impaired, limited or restricted Mobility: Walking and Moving Around Goal Status [phone]): At least 20 percent but less than 40  percent impaired, limited or restricted Mobility: Walking and Moving Around Discharge Status [phone]): At least 20 percent but less than 40 percent impaired, limited or restricted  Lurena Nida, PTA/CLT 09/09/2012, 7:23 PM

## 2012-09-14 ENCOUNTER — Ambulatory Visit (HOSPITAL_COMMUNITY): Payer: Self-pay | Admitting: Physical Therapy

## 2012-09-16 ENCOUNTER — Ambulatory Visit (HOSPITAL_COMMUNITY): Payer: Self-pay | Admitting: *Deleted

## 2012-09-18 ENCOUNTER — Ambulatory Visit (HOSPITAL_COMMUNITY): Payer: Self-pay | Admitting: Physical Therapy

## 2012-09-25 ENCOUNTER — Encounter (HOSPITAL_COMMUNITY): Payer: Self-pay | Admitting: Psychiatry

## 2012-09-25 ENCOUNTER — Ambulatory Visit (INDEPENDENT_AMBULATORY_CARE_PROVIDER_SITE_OTHER): Payer: Medicare Other | Admitting: Psychiatry

## 2012-09-25 VITALS — Wt 210.0 lb

## 2012-09-25 DIAGNOSIS — F419 Anxiety disorder, unspecified: Secondary | ICD-10-CM

## 2012-09-25 DIAGNOSIS — F32A Depression, unspecified: Secondary | ICD-10-CM

## 2012-09-25 DIAGNOSIS — D126 Benign neoplasm of colon, unspecified: Secondary | ICD-10-CM

## 2012-09-25 DIAGNOSIS — F329 Major depressive disorder, single episode, unspecified: Secondary | ICD-10-CM

## 2012-09-25 DIAGNOSIS — F5105 Insomnia due to other mental disorder: Secondary | ICD-10-CM

## 2012-09-25 DIAGNOSIS — IMO0001 Reserved for inherently not codable concepts without codable children: Secondary | ICD-10-CM

## 2012-09-25 DIAGNOSIS — F330 Major depressive disorder, recurrent, mild: Secondary | ICD-10-CM

## 2012-09-25 MED ORDER — GABAPENTIN 100 MG PO CAPS: ORAL_CAPSULE | ORAL | Status: DC

## 2012-09-25 MED ORDER — DULOXETINE HCL 60 MG PO CPEP: 60.0000 mg | ORAL_CAPSULE | Freq: Every day | ORAL | Status: DC

## 2012-09-25 MED ORDER — ZOLPIDEM TARTRATE 10 MG PO TABS: 10.0000 mg | ORAL_TABLET | Freq: Every evening | ORAL | Status: DC | PRN

## 2012-09-25 NOTE — Patient Instructions (Signed)
Keep the walking program going and record your results.  Take care of yourself.  No one else is standing up to do the job and only you know what you need.   GET SERIOUS about taking care of yourself.  Do the next right thing and that often means doing something to care for yourself along the lines of are you hungry, are you angry, are you lonely, are you tired, are you scared?  [employer] is what that stands for.  Call if problems or concerns.

## 2012-09-25 NOTE — Addendum Note (Signed)
Addended by: Mike Craze on: 09/25/2012 11:27 AM   Modules accepted: Orders

## 2012-09-25 NOTE — Progress Notes (Signed)
[facility] [id number] Progress Note BRIAN KOCOUREK MRN: [id number] DOB: 05-11-1946 Age: 67 y.o.  Date: 09/25/2012 Start Time: 11:00 AM End Time: 11:25 AM  Chief Complaint: Chief Complaint  Patient presents with  . Anxiety  . Depression  . Follow-up  . Medication Refill    Subjective: "The Neurontin during the day helps me sleep better during the day". Depression 1/10 and Anxiety 3/10, where 0 is none and 10 is the worst.  Pain is 4 or 5 /10 in her knees and her (L) elbow.  History of presenting illness Patient came for her followup appointment.   Pt reports that she is compliant with the psychotropic medications with mixed benefit and some side effects.  She is noting considerable weight gain and has recently started a new allergy pill.  She will call her prescriber about that.  Discussed how to cut back some on the Neurontin during the day and use the sedation at night for her sleep.  She has been increasing her walking some.  She has been walking further than the mail box.  On her walks she is wondering if she will make it back to the house as she is so sleepy.   Current psychiatric medication Neurontin 300 mg 3 times a day  Cymbalta 60 mg BID Ambien 10 mg at bedtime Vicodan 5/500 rarely Ultram 50 mg about 3 times a day this past week, more than she has ever used it.  Psychiatric history Patient has a long history of depression.  She has been seeing in this office since May 1997.  Patient denies any history of suicidal attempt however endorse chronic depression and anxiety.  In the past she has taken Prozac but did not like after taking few doses, she also has taken Seroquel, Abilify, Xanax, Pamelor, and amitriptyline.  Patient denies a history of paranoia or delusions however there are times when she has been very depressed and have a lot of negative symptoms.  Alcohol and substance use history Patient denies any history of alcohol or substance use.  Medical  history Patient has history of hypertension, hyperlipidemia, GERD, diverticulosis, colonic polyp, chronic back pain, fibromyalgia, chronic constipation, benign tumor of esophagus and chronic headache.  Her primary care physician is Dr. Felecia Shelling, she sees Dr. Harrel Carina for chronic pain, fibromyalgia and headache. Her cardiologist is Dr Mylo Red.  Family History family history includes Alcohol abuse in her father; Anxiety disorder in her mother; Colon cancer in her paternal aunt and paternal uncle; Dementia in her maternal uncle; Depression in her mother; Heart attack in her mother; and Stroke in her father.  There is no history of ADD / ADHD, and Bipolar disorder, and Drug abuse, and OCD, and Paranoid behavior, and Schizophrenia, and Seizures, and Sexual abuse, and Physical abuse, .  Mental status examination Patient is casually dressed and fairly groomed. She is calm cooperative and maintained fair eye contact. She described her mood is okay and her affect is mood appropriate.  Her speech is coherent but slow.  Her thought processes clear and logical. She notes some suicidal thoughts, but no plans, no active or passive homicidal thoughts. She denies any auditory or visual hallucination. There no psychotic symptoms present at this time. She's alert and oriented x3. Her insight judgment impulse control is okay  Lab Results:  Results for orders placed in visit on 01/21/12 (from the past 8736 hour(s))  COMPREHENSIVE METABOLIC PANEL   Collection Time    01/21/12 11:59 AM  Result Value Range   Sodium 141  135 - 145 mEq/L   Potassium 3.0 (*) 3.5 - 5.3 mEq/L   Chloride 104  96 - 112 mEq/L   CO2 28  19 - 32 mEq/L   Glucose, Bld 85  70 - 99 mg/dL   BUN 10  6 - 23 mg/dL   Creat 0.96  0.45 - 4.09 mg/dL   Total Bilirubin 0.3  0.3 - 1.2 mg/dL   Alkaline Phosphatase 67  39 - 117 U/L   AST 18  0 - 37 U/L   ALT 11  0 - 35 U/L   Total Protein 6.1  6.0 - 8.3 g/dL   Albumin 4.0  3.5 - 5.2 g/dL   Calcium 81.1   8.4 - 10.5 mg/dL   Her cardiologist draws her labs and reportedly there are no problems.    Assessment Axis I Maj. depressive disorder Axis II deferred Axis III see medical history Axis IV are to moderate.  Plan: I took her vitals.  I reviewed CC, tobacco/med/surg Hx, meds effects/ side effects, problem list, therapies and responses as well as current situation/symptoms discussed options. Cut back on the daytime Neurontin and Cymbalta per pt request. Keep Ambien See orders and pt instructions for more details.  MEDICATIONS this encounter: Meds ordered this encounter  Medications  . zolpidem (AMBIEN) 10 MG tablet    Sig: Take 1 tablet (10 mg total) by mouth at bedtime as needed for sleep.    Dispense:  30 tablet    Refill:  0  . gabapentin (NEURONTIN) 100 MG capsule    Sig: Take by mouth one to two caps three times a day for anxiety/pain.  May take 3 or 4 or more at night for getting to sleep    Dispense:  900 capsule    Refill:  0    90 day supply  . DULoxetine (CYMBALTA) 60 MG capsule    Sig: Take 1 capsule (60 mg total) by mouth daily.    Dispense:  30 capsule    Refill:  1    Medical Decision Making Problem Points:  Established problem, stable/improving (1), Established problem, worsening (2), Review of last therapy session (1) and Review of psycho-social stressors (1) Data Points:  Review or order clinical lab tests (1) Review of medication regiment & side effects (2) Review of new medications or change in dosage (2)  I certify that outpatient services furnished can reasonably be expected to improve the patient's condition.   Orson Aloe, MD, [facility]

## 2012-10-13 ENCOUNTER — Telehealth (HOSPITAL_COMMUNITY): Payer: Self-pay | Admitting: Psychiatry

## 2012-10-13 NOTE — Telephone Encounter (Signed)
Pt called and described what sounded like restless leg.  She woke and her legs would not stop jumping and she had to calm them by walking around.  She wanted to know what to do with the Ambien.  I mentioned to her that it sounded like restless leg and that iron supplementation helps that.  She did onto connect to that.  I offered her an appointment for tomorrow to discuss this further and she was advised to get Iron wither elemental 100 mg or Iron Sulfate 325 mg and try that.  She wanted to know what to do with the Ambien and I reiterated the notion of using iron to help the restless legs.  She seemed not completely satisfied that I didn't increase her Ambien back up to a higher dose that caused her side effects in the past.

## 2012-10-15 ENCOUNTER — Other Ambulatory Visit (HOSPITAL_COMMUNITY): Payer: Self-pay | Admitting: Internal Medicine

## 2012-10-15 DIAGNOSIS — Z139 Encounter for screening, unspecified: Secondary | ICD-10-CM

## 2012-10-30 ENCOUNTER — Telehealth: Payer: Self-pay

## 2012-10-30 NOTE — Telephone Encounter (Signed)
Pt came by office requesting Amitiza  samples. Pt has not been seen since 10/2011 by KJ. Is it ok to give samples?

## 2012-10-30 NOTE — Telephone Encounter (Signed)
OK to give #[phone].

## 2012-11-03 NOTE — Telephone Encounter (Signed)
Samples at the front desk. Pt is aware. 

## 2012-11-17 ENCOUNTER — Ambulatory Visit (HOSPITAL_COMMUNITY)
Admission: RE | Admit: 2012-11-17 | Discharge: 2012-11-17 | Disposition: A | Discharge status: Home / Self Care | Payer: Medicare Other | Source: Ambulatory Visit | Attending: Internal Medicine | Admitting: Internal Medicine

## 2012-11-17 DIAGNOSIS — Z139 Encounter for screening, unspecified: Secondary | ICD-10-CM

## 2012-11-17 DIAGNOSIS — Z1231 Encounter for screening mammogram for malignant neoplasm of breast: Secondary | ICD-10-CM | POA: Insufficient documentation

## 2012-11-22 DIAGNOSIS — R42 Dizziness and giddiness: Secondary | ICD-10-CM

## 2012-11-25 ENCOUNTER — Ambulatory Visit (INDEPENDENT_AMBULATORY_CARE_PROVIDER_SITE_OTHER): Payer: Medicare Other | Admitting: Psychiatry

## 2012-11-25 ENCOUNTER — Telehealth (HOSPITAL_COMMUNITY): Payer: Self-pay | Admitting: Psychiatry

## 2012-11-25 ENCOUNTER — Encounter (HOSPITAL_COMMUNITY): Payer: Self-pay | Admitting: Psychiatry

## 2012-11-25 VITALS — BP 110/67 | Ht 61.75 in | Wt 207.2 lb

## 2012-11-25 DIAGNOSIS — IMO0001 Reserved for inherently not codable concepts without codable children: Secondary | ICD-10-CM

## 2012-11-25 DIAGNOSIS — F419 Anxiety disorder, unspecified: Secondary | ICD-10-CM

## 2012-11-25 DIAGNOSIS — F5105 Insomnia due to other mental disorder: Secondary | ICD-10-CM

## 2012-11-25 DIAGNOSIS — F329 Major depressive disorder, single episode, unspecified: Secondary | ICD-10-CM

## 2012-11-25 DIAGNOSIS — F32A Depression, unspecified: Secondary | ICD-10-CM

## 2012-11-25 DIAGNOSIS — T50905A Adverse effect of unspecified drugs, medicaments and biological substances, initial encounter: Secondary | ICD-10-CM | POA: Insufficient documentation

## 2012-11-25 MED ORDER — ZOLPIDEM TARTRATE 10 MG PO TABS: 10.0000 mg | ORAL_TABLET | Freq: Every evening | ORAL | Status: DC | PRN

## 2012-11-25 MED ORDER — DULOXETINE HCL 20 MG PO CPEP: ORAL_CAPSULE | ORAL | Status: DC

## 2012-11-25 NOTE — Addendum Note (Signed)
Addended by: Mike Craze on: 11/25/2012 09:43 AM   Modules accepted: Orders

## 2012-11-25 NOTE — Progress Notes (Addendum)
[facility] [id number] Progress Note [name] MRN: [id number] DOB: 1946-04-07 Age: 67 y.o.  Date: 11/25/2012 Start Time: 9:15 AM End Time: 9:42 AM  Chief Complaint: Chief Complaint  Patient presents with  . Anxiety  . Depression  . Follow-up  . Medication Refill    Subjective: "The Iron didn't help the restless legs much.  I noticed on TV that Cymbalta can cause a rash and I have a rash on both wrists and cheek bones.". Depression 0/10 and Anxiety 3/10, where 0 is none and 10 is the worst.  Pain is 2/10 with a headache  History of presenting illness Patient came for her followup appointment.   Pt reports that she is compliant with the psychotropic medications with good benefit and some side effects.  She is noting a rash with the Cymbalta. Will stop and have that go away completely first.  Discussed several options, but she has tried and had poor results or side effects with Wellbutrin, Remeron, Elavil, Abilify, Trazodone.  Discussed with psychopharmacologist and was offered very low dose and very slow titration on Lamictal as an option.  Explained the skin reaction with Lamictal to pt and she agrees to try it.    Day time sleepiness not as much a problem with the Neurontin at 100 mg caps  Current psychiatric medication Neurontin 100 mg 3 times a day  Cymbalta 60 mg daily Ambien 10 mg at bedtime Vicodan 5/500 rarely Ultram 50 mg about 3 times a day this past week, more than she has ever used it.  Psychiatric history Patient has a long history of depression.  She has been seeing in this office since May 1997.  Patient denies any history of suicidal attempt however endorse chronic depression and anxiety.  In the past she has taken Prozac but did not like after taking few doses, she also has taken Seroquel, Abilify, Xanax, Pamelor, and amitriptyline.  Patient denies a history of paranoia or delusions however there are times when she has been very depressed and have a lot of  negative symptoms.  Alcohol and substance use history Patient denies any history of alcohol or substance use.  Allergies: Allergies  Allergen Reactions  . Elavil (Amitriptyline) Other (See Comments)    Felt really nervous and felt like something was hold her feet or arm and/ or AM headache on it and back on it and went away off it.   . Abilify (Aripiprazole) Other (See Comments)    Dystonic reaction  . Trazodone And Nefazodone Other (See Comments)    Brought back bad dreams of things in the past  . Codeine Hives, Nausea Only and Other (See Comments)    Feels funny  . Latex Hives  . Penicillins Hives  . Sulfonamide Derivatives Nausea And Vomiting  . Ambien (Zolpidem) Other (See Comments)    Causes to 'black out'and eat and do work and not remember in the AM.   . Remeron (Mirtazapine) Other (See Comments)    Caused lots of strange, weird, crazy dreams.   Medical History: Past Medical History  Diagnosis Date  . Hypertension   . Hyperlipidemia   . Gastroesophageal reflux disease   . Diverticulosis   . Anxiety and depression   . Adenomatous colon polyp 2006    excised in 2006 & 2010Due surveillance 06/2013  . Chronic back pain   . Thyroid nodule   . Fibromyalgia   . Migraines   . Insomnia   . Chronic constipation   . Asthma   .  History of benign esophageal tumor 2010    granular cell esophageal tumor (Dx 06/2008), resected via EMR 2011, due repeat EGD 02/2012  . Chest discomfort   . Hiatal hernia   . Depression   . Anxiety   Patient has history of hypertension, hyperlipidemia, GERD, diverticulosis, colonic polyp, chronic back pain, fibromyalgia, chronic constipation, benign tumor of esophagus and chronic headache.  Her primary care physician is Dr. Felecia Shelling, she sees Dr. Harrel Carina for chronic pain, fibromyalgia and headache. Her cardiologist is Dr Mylo Red. Surgical History: Past Surgical History  Procedure Laterality Date  . Tonsillectomy    . Abdominal hysterectomy    .  Total knee arthroplasty  2002    Right; previous arthroscopic surgery  . Shoulder arthroscopy      Right; bone spurs removed  . Lung biopsy      negative  . 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 Right oophorectomy      benign disease  . Colonoscopy  06/2008, 06/2011    sigmoid tics, tubular adenoma; 2013: anal canal hemorrhoids  . Eus  08/2010    Dr [facility] with EGD. Retained food. No recurrent esophageal lesion, bx negative.  . Colonoscopy  07/10/2011    Anal canal hemorrhoids likely the cause of hematochezia in the setting of constipation; otherwise normal rectum ;submucosal  petechiae in left colon of doubtful clinical significance; otherwise, normal colon  . Esophagogastroduodenoscopy  12/11/2011    Procedure: ESOPHAGOGASTRODUODENOSCOPY (EGD);  Surgeon: Corbin Ade, MD;  [facility]: [facility];  Service: Endoscopy;  Laterality: N/A;  11:00  . Bravo ph study  12/11/2011    Procedure: BRAVO PH STUDY;  Surgeon: Corbin Ade, MD;  Location: [facility];  Service: Endoscopy;  Laterality: N/A;   Family History: family history includes Alcohol abuse in her father; Anxiety disorder in her mother; Colon cancer in her paternal aunt and paternal uncle; Dementia in her maternal uncle; Depression in her mother; Heart attack in her mother; and Stroke in her father.  There is no history of ADD / ADHD, and Bipolar disorder, and Drug abuse, and OCD, and Paranoid behavior, and Schizophrenia, and Seizures, and Sexual abuse, and Physical abuse, . Reviewed and nothing new today.  Mental status examination Patient is casually dressed and fairly groomed. She is calm cooperative and maintained fair eye contact. She described her mood is okay and her affect is mood appropriate.  Her speech is coherent but slow.  Her thought processes clear and logical. She notes some suicidal thoughts, but no plans, no active or passive homicidal thoughts. She denies any  auditory or visual hallucination. There no psychotic symptoms present at this time. She's alert and oriented x3. Her insight judgment impulse control is okay  Lab Results:  Results for orders placed in visit on 01/21/12 (from the past 8736 hour(s))  COMPREHENSIVE METABOLIC PANEL   Collection Time    01/21/12 11:59 AM      Result Value Range   Sodium 141  135 - 145 mEq/L   Potassium 3.0 (*) 3.5 - 5.3 mEq/L   Chloride 104  96 - 112 mEq/L   CO2 28  19 - 32 mEq/L   Glucose, Bld 85  70 - 99 mg/dL   BUN 10  6 - 23 mg/dL   Creat 1.61  0.96 - 0.45 mg/dL   Total Bilirubin 0.3  0.3 - 1.2 mg/dL   Alkaline Phosphatase 67  39 - 117 U/L  AST 18  0 - 37 U/L   ALT 11  0 - 35 U/L   Total Protein 6.1  6.0 - 8.3 g/dL   Albumin 4.0  3.5 - 5.2 g/dL   Calcium 21.3  8.4 - 08.6 mg/dL   Her cardiologist draws her labs and reportedly there are no problems.    Assessment Axis I Maj. depressive disorder Axis II deferred Axis III see medical history Axis IV are to moderate.  Plan: I took her vitals.  I reviewed CC, tobacco/med/surg Hx, meds effects/ side effects, problem list, therapies and responses as well as current situation/symptoms discussed options. Taper off Cymbalta and 3 days after the rash goes away then try very low dose Lamictal See orders and pt instructions for more details.  MEDICATIONS this encounter: Meds ordered this encounter  Medications  . DULoxetine (CYMBALTA) 20 MG capsule    Sig: Take by mouth 2 on the 5th and 6th then 1 a day for 7th, 8th, 9th, and 10th then STOP    Dispense:  6 capsule    Refill:  0  . zolpidem (AMBIEN) 10 MG tablet    Sig: Take 1 tablet (10 mg total) by mouth at bedtime as needed for sleep.    Dispense:  30 tablet    Refill:  0    Medical Decision Making Problem Points:  Established problem, stable/improving (1), New problem, with additional work-up planned (4), Review of last therapy session (1) and Review of psycho-social stressors (1) Data  Points:  Review or order clinical lab tests (1) Review of new medications or change in dosage (2)  I certify that outpatient services furnished can reasonably be expected to improve the patient's condition.   Orson Aloe, MD, [facility]

## 2012-11-25 NOTE — Patient Instructions (Signed)
Stop the 60 mg Cymbalta and taper off with the new script.  Return in 2 weeks  Call if problems or concerns.

## 2012-12-03 ENCOUNTER — Other Ambulatory Visit: Payer: Self-pay | Admitting: Cardiology

## 2012-12-03 NOTE — Telephone Encounter (Signed)
Medication sent via escribe.  

## 2012-12-08 ENCOUNTER — Other Ambulatory Visit: Payer: Self-pay

## 2012-12-09 ENCOUNTER — Ambulatory Visit (INDEPENDENT_AMBULATORY_CARE_PROVIDER_SITE_OTHER): Payer: Medicare Other | Admitting: Psychiatry

## 2012-12-09 ENCOUNTER — Encounter (HOSPITAL_COMMUNITY): Payer: Self-pay | Admitting: Psychiatry

## 2012-12-09 VITALS — BP 155/90 | Wt 207.4 lb

## 2012-12-09 DIAGNOSIS — IMO0001 Reserved for inherently not codable concepts without codable children: Secondary | ICD-10-CM

## 2012-12-09 DIAGNOSIS — T50905D Adverse effect of unspecified drugs, medicaments and biological substances, subsequent encounter: Secondary | ICD-10-CM

## 2012-12-09 DIAGNOSIS — F329 Major depressive disorder, single episode, unspecified: Secondary | ICD-10-CM

## 2012-12-09 DIAGNOSIS — F32A Depression, unspecified: Secondary | ICD-10-CM

## 2012-12-09 DIAGNOSIS — F5105 Insomnia due to other mental disorder: Secondary | ICD-10-CM

## 2012-12-09 DIAGNOSIS — F419 Anxiety disorder, unspecified: Secondary | ICD-10-CM

## 2012-12-09 MED ORDER — RABEPRAZOLE SODIUM 20 MG PO TBEC: 20.0000 mg | DELAYED_RELEASE_TABLET | Freq: Two times a day (BID) | ORAL | Status: DC

## 2012-12-09 MED ORDER — ZOLPIDEM TARTRATE 10 MG PO TABS: 10.0000 mg | ORAL_TABLET | Freq: Every evening | ORAL | Status: DC | PRN

## 2012-12-09 MED ORDER — GABAPENTIN 100 MG PO CAPS: ORAL_CAPSULE | ORAL | Status: DC

## 2012-12-09 NOTE — Patient Instructions (Addendum)
CUT BACK/CUT OUT on sugar and carbohydrates, that means very limited fruits and starchy vegetables and very limited grains, breads  The goal is low GLYCEMIC INDEX.  CUT OUT all wheat, rye, or barley for the GLUTEN in them.  HIGH fat and LOW carbohydrate diet is the KEY.  Eat avocados, eggs, lean meat like grass fed beef and chicken  Nuts and seeds would be good foods as well.   Stevia is an excellent sweetener.  Safe for the brain.   Truvia is also a good safe sweetener, not the baking blend form of Truvia  Almond butter is awesome.  Check out all this on the Internet.  Dr Purlmutter is on the Internet with some good info about this.   http://www.drperlmutter.com is where that is.  An excellent site for info on this diet is http://paleoleap.com  [employer] makes dark chocolate that is sweetened with Stevia that is safe.  Zevia is a soda sweetened with Stevia and is available at [employer] among other places.  Call if problems or concerns.  

## 2012-12-09 NOTE — Progress Notes (Signed)
[facility] [id number] Progress Note NYKIRA REDDIX MRN: [id number] DOB: December 09, 1945 Age: 67 y.o.  Date: 12/09/2012 Start Time: 9:45 AM End Time: 10:10 AM  Chief Complaint: Chief Complaint  Patient presents with  . Anxiety  . Depression  . Follow-up  . Medication Refill    Subjective: "The rash is clearing up after I got on Predisone.  I'm off the Lamictal too and note no change in mood.  They are taking me off all kinds of medicines to see if that clears up my skin". Depression 2/10 and Anxiety 3/10, where 0 is none and 10 is the worst.  Pain is 6/10 with back and head.  Allergies are bothering me at 8/10.  History of presenting illness Patient came for her followup appointment.   Pt reports that she is compliant with the psychotropic medications with good benefit and some side effects.  She is noting some dizziness.  That could be related ot her BP, very high today. Discussed how diet and getting into creative things could be helpful for her.  She likes to bake things.  Current psychiatric medication Neurontin 100 mg 3 times a day  Ambien 10 mg at bedtime Vicodan 5/500 rarely Ultram 50 mg about 3 times a day this past week, more than she has ever used it.  Vitals: BP 155/90  Wt 207 lb 6.4 oz (94.076 kg)  BMI 38.26 kg/m2  Psychiatric history Patient has a long history of depression.  She has been seeing in this office since May 1997.  Patient denies any history of suicidal attempt however endorse chronic depression and anxiety.  In the past she has taken Prozac but did not like after taking few doses, she also has taken Seroquel, Abilify, Xanax, Pamelor, and amitriptyline.  Patient denies a history of paranoia or delusions however there are times when she has been very depressed and have a lot of negative symptoms.  Alcohol and substance use history Patient denies any history of alcohol or substance use.  Allergies: Allergies  Allergen Reactions  . Elavil  (Amitriptyline) Other (See Comments)    Felt really nervous and felt like something was hold her feet or arm and/ or AM headache on it and back on it and went away off it.   . Abilify (Aripiprazole) Other (See Comments)    Dystonic reaction  . Trazodone And Nefazodone Other (See Comments)    Brought back bad dreams of things in the past  . Codeine Hives, Nausea Only and Other (See Comments)    Feels funny  . Latex Hives  . Penicillins Hives  . Sulfonamide Derivatives Nausea And Vomiting  . Ambien (Zolpidem) Other (See Comments)    Causes to 'black out'and eat and do work and not remember in the AM.   . Remeron (Mirtazapine) Other (See Comments)    Caused lots of strange, weird, crazy dreams.   Medical History: Past Medical History  Diagnosis Date  . Hypertension   . Hyperlipidemia   . Gastroesophageal reflux disease   . Diverticulosis   . Anxiety and depression   . Adenomatous colon polyp 2006    excised in 2006 & 2010Due surveillance 06/2013  . Chronic back pain   . Thyroid nodule   . Fibromyalgia   . Migraines   . Insomnia   . Chronic constipation   . Asthma   . History of benign esophageal tumor 2010    granular cell esophageal tumor (Dx 06/2008), resected via EMR 2011, due repeat EGD  02/2012  . Chest discomfort   . Hiatal hernia   . Depression   . Anxiety   Patient has history of hypertension, hyperlipidemia, GERD, diverticulosis, colonic polyp, chronic back pain, fibromyalgia, chronic constipation, benign tumor of esophagus and chronic headache.  Her primary care physician is Dr. Felecia Shelling, she sees Dr. Harrel Carina for chronic pain, fibromyalgia and headache. Her cardiologist is Dr Mylo Red. Surgical History: Past Surgical History  Procedure Laterality Date  . Tonsillectomy    . Abdominal hysterectomy    . Total knee arthroplasty  2002    Right; previous arthroscopic surgery  . Shoulder arthroscopy      Right; bone spurs removed  . Lung biopsy      negative  . Breast  excisional biopsy  1990s, 2012    Left x2-sclerosing ductal papilloma-2012  . Right oophorectomy      benign disease  . Colonoscopy  06/2008, 06/2011    sigmoid tics, tubular adenoma; 2013: anal canal hemorrhoids  . Eus  08/2010    Dr [facility] with EGD. Retained food. No recurrent esophageal lesion, bx negative.  . Colonoscopy  07/10/2011    Anal canal hemorrhoids likely the cause of hematochezia in the setting of constipation; otherwise normal rectum ;submucosal  petechiae in left colon of doubtful clinical significance; otherwise, normal colon  . Esophagogastroduodenoscopy  12/11/2011    Procedure: ESOPHAGOGASTRODUODENOSCOPY (EGD);  Surgeon: Corbin Ade, MD;  [facility]: [facility];  Service: Endoscopy;  Laterality: N/A;  11:00  . Bravo ph study  12/11/2011    Procedure: BRAVO PH STUDY;  Surgeon: Corbin Ade, MD;  Location: [facility];  Service: Endoscopy;  Laterality: N/A;   Family History: family history includes Alcohol abuse in her father; Anxiety disorder in her mother; Colon cancer in her paternal aunt and paternal uncle; Dementia in her maternal uncle; Depression in her mother; Heart attack in her mother; and Stroke in her father.  There is no history of ADD / ADHD, and Bipolar disorder, and Drug abuse, and OCD, and Paranoid behavior, and Schizophrenia, and Seizures, and Sexual abuse, and Physical abuse, . Reviewed and nothing new today again.  Mental status examination Patient is casually dressed and fairly groomed. She is calm cooperative and maintained fair eye contact. She described her mood is okay and her affect is mood appropriate.  Her speech is coherent but slow.  Her thought processes clear and logical. She notes some suicidal thoughts, but no plans, no active or passive homicidal thoughts. She denies any auditory or visual hallucination. There no psychotic symptoms present at this time. She's alert and oriented x3. Her insight judgment impulse control is okay  Lab  Results:  Results for orders placed in visit on 01/21/12 (from the past 8736 hour(s))  COMPREHENSIVE METABOLIC PANEL   Collection Time    01/21/12 11:59 AM      Result Value Range   Sodium 141  135 - 145 mEq/L   Potassium 3.0 (*) 3.5 - 5.3 mEq/L   Chloride 104  96 - 112 mEq/L   CO2 28  19 - 32 mEq/L   Glucose, Bld 85  70 - 99 mg/dL   BUN 10  6 - 23 mg/dL   Creat 1.61  0.96 - 0.45 mg/dL   Total Bilirubin 0.3  0.3 - 1.2 mg/dL   Alkaline Phosphatase 67  39 - 117 U/L   AST 18  0 - 37 U/L   ALT 11  0 - 35 U/L   Total  Protein 6.1  6.0 - 8.3 g/dL   Albumin 4.0  3.5 - 5.2 g/dL   Calcium 25.3  8.4 - 66.4 mg/dL   Her cardiologist draws her labs and reportedly there are no problems.    Assessment Axis I Maj. depressive disorder Axis II deferred Axis III see medical history Axis IV are to moderate.  Plan: I took her vitals.  I reviewed CC, tobacco/med/surg Hx, meds effects/ side effects, problem list, therapies and responses as well as current situation/symptoms discussed options. Continue Neurontin and Ambien for sleep. See orders and pt instructions for more details.  MEDICATIONS this encounter: No orders of the defined types were placed in this encounter.    Medical Decision Making Problem Points:  Established problem, stable/improving (1), New problem, with additional work-up planned (4), Review of last therapy session (1) and Review of psycho-social stressors (1) Data Points:  Review or order clinical lab tests (1) Review of new medications or change in dosage (2)  I certify that outpatient services furnished can reasonably be expected to improve the patient's condition.   Orson Aloe, MD, [facility]

## 2012-12-26 ENCOUNTER — Inpatient Hospital Stay (HOSPITAL_COMMUNITY)
Admission: EM | Admit: 2012-12-26 | Discharge: 2012-12-31 | DRG: 392 | Disposition: A | Discharge status: Home / Self Care | Payer: Medicare Other | Attending: Internal Medicine | Admitting: Internal Medicine

## 2012-12-26 ENCOUNTER — Observation Stay (HOSPITAL_COMMUNITY): Payer: Medicare Other

## 2012-12-26 ENCOUNTER — Encounter (HOSPITAL_COMMUNITY): Payer: Self-pay | Admitting: Emergency Medicine

## 2012-12-26 ENCOUNTER — Emergency Department (HOSPITAL_COMMUNITY): Payer: Medicare Other

## 2012-12-26 DIAGNOSIS — G43909 Migraine, unspecified, not intractable, without status migrainosus: Secondary | ICD-10-CM | POA: Diagnosis present

## 2012-12-26 DIAGNOSIS — Z96659 Presence of unspecified artificial knee joint: Secondary | ICD-10-CM

## 2012-12-26 DIAGNOSIS — R1013 Epigastric pain: Principal | ICD-10-CM | POA: Diagnosis present

## 2012-12-26 DIAGNOSIS — IMO0001 Reserved for inherently not codable concepts without codable children: Secondary | ICD-10-CM | POA: Diagnosis present

## 2012-12-26 DIAGNOSIS — J9691 Respiratory failure, unspecified with hypoxia: Secondary | ICD-10-CM

## 2012-12-26 DIAGNOSIS — E785 Hyperlipidemia, unspecified: Secondary | ICD-10-CM | POA: Diagnosis present

## 2012-12-26 DIAGNOSIS — J189 Pneumonia, unspecified organism: Secondary | ICD-10-CM

## 2012-12-26 DIAGNOSIS — F411 Generalized anxiety disorder: Secondary | ICD-10-CM | POA: Diagnosis present

## 2012-12-26 DIAGNOSIS — J45909 Unspecified asthma, uncomplicated: Secondary | ICD-10-CM | POA: Diagnosis present

## 2012-12-26 DIAGNOSIS — R7989 Other specified abnormal findings of blood chemistry: Secondary | ICD-10-CM | POA: Diagnosis present

## 2012-12-26 DIAGNOSIS — K219 Gastro-esophageal reflux disease without esophagitis: Secondary | ICD-10-CM | POA: Diagnosis present

## 2012-12-26 DIAGNOSIS — R748 Abnormal levels of other serum enzymes: Secondary | ICD-10-CM | POA: Diagnosis present

## 2012-12-26 DIAGNOSIS — I10 Essential (primary) hypertension: Secondary | ICD-10-CM | POA: Diagnosis present

## 2012-12-26 DIAGNOSIS — K5900 Constipation, unspecified: Secondary | ICD-10-CM | POA: Diagnosis present

## 2012-12-26 DIAGNOSIS — R7402 Elevation of levels of lactic acid dehydrogenase (LDH): Secondary | ICD-10-CM | POA: Diagnosis present

## 2012-12-26 DIAGNOSIS — R7401 Elevation of levels of liver transaminase levels: Secondary | ICD-10-CM | POA: Diagnosis present

## 2012-12-26 DIAGNOSIS — R079 Chest pain, unspecified: Secondary | ICD-10-CM

## 2012-12-26 DIAGNOSIS — G8929 Other chronic pain: Secondary | ICD-10-CM | POA: Diagnosis present

## 2012-12-26 DIAGNOSIS — M549 Dorsalgia, unspecified: Secondary | ICD-10-CM | POA: Diagnosis present

## 2012-12-26 LAB — URINALYSIS, ROUTINE W REFLEX MICROSCOPIC
Bilirubin Urine: NEGATIVE
Glucose, UA: NEGATIVE mg/dL
Hgb urine dipstick: NEGATIVE
Ketones, ur: NEGATIVE mg/dL
Nitrite: NEGATIVE
Protein, ur: NEGATIVE mg/dL
Specific Gravity, Urine: >1.03 — ABNORMAL HIGH (ref 1.005–1.030)
Urobilinogen, UA: 0.2 mg/dL (ref 0.0–1.0)
pH: 5.5 (ref 5.0–8.0)

## 2012-12-26 LAB — LIPASE, BLOOD: Lipase: 47 U/L (ref 11–59)

## 2012-12-26 LAB — CBC WITH DIFFERENTIAL/PLATELET
Basophils Absolute: 0 10*3/uL (ref 0.0–0.1)
Basophils Relative: 0 % (ref 0–1)
Eosinophils Absolute: 0 10*3/uL (ref 0.0–0.7)
Eosinophils Relative: 0 % (ref 0–5)
HCT: 38.8 % (ref 36.0–46.0)
Hemoglobin: 12.6 g/dL (ref 12.0–15.0)
Lymphocytes Relative: 7 % — ABNORMAL LOW (ref 12–46)
Lymphs Abs: 0.3 10*3/uL — ABNORMAL LOW (ref 0.7–4.0)
MCH: 26.6 pg (ref 26.0–34.0)
MCHC: 32.5 g/dL (ref 30.0–36.0)
MCV: 81.9 fL (ref 78.0–100.0)
Monocytes Absolute: 0.1 10*3/uL (ref 0.1–1.0)
Monocytes Relative: 1 % — ABNORMAL LOW (ref 3–12)
Neutro Abs: 4.4 10*3/uL (ref 1.7–7.7)
Neutrophils Relative %: 91 % — ABNORMAL HIGH (ref 43–77)
Platelets: 125 10*3/uL — ABNORMAL LOW (ref 150–400)
RBC: 4.74 MIL/uL (ref 3.87–5.11)
RDW: 15.2 % (ref 11.5–15.5)
WBC: 4.9 10*3/uL (ref 4.0–10.5)

## 2012-12-26 LAB — TROPONIN I
Troponin I: <0.3 ng/mL (ref <0.30)
Troponin I: <0.3 ng/mL (ref <0.30)
Troponin I: <0.3 ng/mL (ref <0.30)

## 2012-12-26 LAB — BASIC METABOLIC PANEL
BUN: 16 mg/dL (ref 6–23)
CO2: 27 mEq/L (ref 19–32)
Calcium: 10.8 mg/dL — ABNORMAL HIGH (ref 8.4–10.5)
Chloride: 104 mEq/L (ref 96–112)
Creatinine, Ser: 0.89 mg/dL (ref 0.50–1.10)
GFR calc Af Amer: 77 mL/min — ABNORMAL LOW (ref >90)
GFR calc non Af Amer: 66 mL/min — ABNORMAL LOW (ref >90)
Glucose, Bld: 137 mg/dL — ABNORMAL HIGH (ref 70–99)
Potassium: 3.2 mEq/L — ABNORMAL LOW (ref 3.5–5.1)
Sodium: 139 mEq/L (ref 135–145)

## 2012-12-26 LAB — D-DIMER, QUANTITATIVE: D-Dimer, Quant: 0.31 ug/mL-FEU (ref 0.00–0.48)

## 2012-12-26 LAB — HEPATIC FUNCTION PANEL
ALT: 121 U/L — ABNORMAL HIGH (ref 0–35)
AST: 247 U/L — ABNORMAL HIGH (ref 0–37)
Albumin: 3.8 g/dL (ref 3.5–5.2)
Alkaline Phosphatase: 90 U/L (ref 39–117)
Bilirubin, Direct: 0.4 mg/dL — ABNORMAL HIGH (ref 0.0–0.3)
Indirect Bilirubin: 0.4 mg/dL (ref 0.3–0.9)
Total Bilirubin: 0.8 mg/dL (ref 0.3–1.2)
Total Protein: 6.5 g/dL (ref 6.0–8.3)

## 2012-12-26 LAB — URINE MICROSCOPIC-ADD ON

## 2012-12-26 MED ORDER — TERAZOSIN HCL 5 MG PO CAPS
10.0000 mg | ORAL_CAPSULE | Freq: Every day | ORAL | Status: DC
  Administered 2012-12-26 – 2012-12-30 (×5): 10 mg via ORAL
  Filled 2012-12-26 (×5): qty 2

## 2012-12-26 MED ORDER — HYDROCODONE-ACETAMINOPHEN 5-325 MG PO TABS
1.0000 | ORAL_TABLET | Freq: Four times a day (QID) | ORAL | Status: DC | PRN
  Administered 2012-12-26 – 2012-12-30 (×3): 1 via ORAL
  Filled 2012-12-26 (×3): qty 1

## 2012-12-26 MED ORDER — ACETAMINOPHEN 650 MG RE SUPP: 650.0000 mg | Freq: Four times a day (QID) | RECTAL | Status: DC | PRN

## 2012-12-26 MED ORDER — GI COCKTAIL ~~LOC~~
30.0000 mL | Freq: Two times a day (BID) | ORAL | Status: DC | PRN
  Administered 2012-12-26 – 2012-12-28 (×4): 30 mL via ORAL
  Filled 2012-12-26 (×4): qty 30

## 2012-12-26 MED ORDER — ENOXAPARIN SODIUM 40 MG/0.4ML ~~LOC~~ SOLN
40.0000 mg | SUBCUTANEOUS | Status: DC
  Administered 2012-12-26 – 2012-12-29 (×4): 40 mg via SUBCUTANEOUS
  Filled 2012-12-26 (×4): qty 0.4

## 2012-12-26 MED ORDER — ATENOLOL 25 MG PO TABS
50.0000 mg | ORAL_TABLET | Freq: Every day | ORAL | Status: DC
  Administered 2012-12-27 – 2012-12-30 (×4): 50 mg via ORAL
  Filled 2012-12-26 (×4): qty 2

## 2012-12-26 MED ORDER — ACETAMINOPHEN 325 MG PO TABS: 650.0000 mg | ORAL_TABLET | Freq: Once | ORAL | Status: AC

## 2012-12-26 MED ORDER — LORATADINE 10 MG PO TABS
10.0000 mg | ORAL_TABLET | Freq: Every day | ORAL | Status: DC
  Administered 2012-12-26 – 2012-12-30 (×5): 10 mg via ORAL
  Filled 2012-12-26 (×5): qty 1

## 2012-12-26 MED ORDER — NITROGLYCERIN 0.4 MG SL SUBL
0.4000 mg | SUBLINGUAL_TABLET | SUBLINGUAL | Status: DC | PRN
  Administered 2012-12-26: 0.4 mg via SUBLINGUAL
  Filled 2012-12-26: qty 25

## 2012-12-26 MED ORDER — PANTOPRAZOLE SODIUM 40 MG PO TBEC
40.0000 mg | DELAYED_RELEASE_TABLET | Freq: Every day | ORAL | Status: DC
  Administered 2012-12-26 – 2012-12-30 (×5): 40 mg via ORAL
  Filled 2012-12-26 (×5): qty 1

## 2012-12-26 MED ORDER — ACETAMINOPHEN 325 MG PO TABS
650.0000 mg | ORAL_TABLET | Freq: Four times a day (QID) | ORAL | Status: DC | PRN
  Administered 2012-12-27 – 2012-12-31 (×3): 650 mg via ORAL
  Filled 2012-12-26 (×3): qty 2

## 2012-12-26 MED ORDER — ATENOLOL 25 MG PO TABS
100.0000 mg | ORAL_TABLET | Freq: Every day | ORAL | Status: DC
  Administered 2012-12-27 – 2012-12-30 (×4): 100 mg via ORAL
  Filled 2012-12-26 (×4): qty 4

## 2012-12-26 MED ORDER — SODIUM CHLORIDE 0.9 % IJ SOLN
3.0000 mL | Freq: Two times a day (BID) | INTRAMUSCULAR | Status: DC
  Administered 2012-12-27 – 2012-12-28 (×2): 3 mL via INTRAVENOUS

## 2012-12-26 MED ORDER — ACETAMINOPHEN 325 MG PO TABS
ORAL_TABLET | ORAL | Status: AC
  Administered 2012-12-26: 650 mg via ORAL
  Filled 2012-12-26: qty 2

## 2012-12-26 MED ORDER — ALBUTEROL SULFATE HFA 108 (90 BASE) MCG/ACT IN AERS
2.0000 | INHALATION_SPRAY | Freq: Four times a day (QID) | RESPIRATORY_TRACT | Status: DC | PRN
  Filled 2012-12-26: qty 6.7

## 2012-12-26 MED ORDER — GABAPENTIN 300 MG PO CAPS
300.0000 mg | ORAL_CAPSULE | Freq: Every day | ORAL | Status: DC
  Administered 2012-12-26 – 2012-12-30 (×5): 300 mg via ORAL
  Filled 2012-12-26 (×5): qty 1

## 2012-12-26 MED ORDER — LEVOCETIRIZINE DIHYDROCHLORIDE 5 MG PO TABS: 5.0000 mg | ORAL_TABLET | Freq: Every evening | ORAL | Status: DC

## 2012-12-26 MED ORDER — AMLODIPINE BESYLATE 5 MG PO TABS
5.0000 mg | ORAL_TABLET | Freq: Every day | ORAL | Status: DC
  Administered 2012-12-27 – 2012-12-30 (×4): 5 mg via ORAL
  Filled 2012-12-26 (×4): qty 1

## 2012-12-26 MED ORDER — SODIUM CHLORIDE 0.9 % IV SOLN
INTRAVENOUS | Status: DC
  Administered 2012-12-26 – 2012-12-31 (×8): via INTRAVENOUS

## 2012-12-26 MED ORDER — GABAPENTIN 100 MG PO CAPS
200.0000 mg | ORAL_CAPSULE | Freq: Three times a day (TID) | ORAL | Status: DC
  Administered 2012-12-26 – 2012-12-31 (×14): 200 mg via ORAL
  Filled 2012-12-26 (×8): qty 2
  Filled 2012-12-26: qty 1
  Filled 2012-12-26 (×4): qty 2
  Filled 2012-12-26: qty 1
  Filled 2012-12-26: qty 2

## 2012-12-26 MED ORDER — SIMVASTATIN 20 MG PO TABS
20.0000 mg | ORAL_TABLET | Freq: Every day | ORAL | Status: DC
  Administered 2012-12-26 – 2012-12-30 (×5): 20 mg via ORAL
  Filled 2012-12-26 (×5): qty 1

## 2012-12-26 MED ORDER — ZOLPIDEM TARTRATE 5 MG PO TABS
5.0000 mg | ORAL_TABLET | Freq: Every evening | ORAL | Status: DC | PRN
  Administered 2012-12-26 – 2012-12-30 (×5): 5 mg via ORAL
  Filled 2012-12-26 (×6): qty 1

## 2012-12-26 MED ORDER — POTASSIUM CHLORIDE CRYS ER 20 MEQ PO TBCR
20.0000 meq | EXTENDED_RELEASE_TABLET | Freq: Every day | ORAL | Status: DC
  Administered 2012-12-26 – 2012-12-30 (×5): 20 meq via ORAL
  Filled 2012-12-26 (×5): qty 1

## 2012-12-26 MED ORDER — ATENOLOL 25 MG PO TABS: 50.0000 mg | ORAL_TABLET | Freq: Two times a day (BID) | ORAL | Status: DC

## 2012-12-26 MED ORDER — SODIUM CHLORIDE 0.9 % IV SOLN: INTRAVENOUS | Status: AC

## 2012-12-26 NOTE — ED Provider Notes (Signed)
Date: 12/26/2012    1610  Rate:98  Rhythm: normal sinus rhythm  QRS Axis: normal  Intervals: normal  ST/T Wave abnormalities: nonspecific T wave changes  Conduction Disutrbances:none  Narrative Interpretation:   Old EKG Reviewed: unchanged c/w 09/17/10   Nicoletta Dress. Colon Branch, MD 12/26/12 [phone]

## 2012-12-26 NOTE — ED Notes (Signed)
Onset of anterior chest pain, describes as sharp, with radiation into abdominal area.  NTG and ASA administered by EMS without any change.  States she now has a headache. NO nausea or vomiting

## 2012-12-26 NOTE — H&P (Signed)
[facility] History and Physical  [name] WJX:[id number] DOB: 05/29/1946 DOA: 12/26/2012  Referring physician: Dr. Clarene Duke PCP: Avon Gully, MD  Specialists: none  Chief Complaint: epigastric pain  HPI: [name] is a 67 y.o. female has a past medical history significant for HTN, GERD, Depression, anxiety, presents with a chief complaint of chest/epigastric pain that started this morning around 5 am. She points to her epigastric area and states that it irradiates down in her abdomen. She initially thought that it is her reflux pain but this time it persisted. Mild nausea without vomiting. Pain is worse with a deep breath and is worse with palpation. Her pain irradiates in her back. She reports similar pains in the past somewhat related to what she eats and has certain foods that she avoids because of her GERD. No lightheadedness or dizziness, endorses mild HA after initial nitro given by EMS. In the ED she had mild LFT elevation of unclear significance at this point.   Review of Systems: as per HPI otherwise negative.  Past Medical History  Diagnosis Date  . Hypertension   . Hyperlipidemia   . Gastroesophageal reflux disease   . Diverticulosis   . Anxiety and depression   . Adenomatous colon polyp 2006    excised in 2006 & 2010Due surveillance 06/2013  . Chronic back pain   . Thyroid nodule   . Fibromyalgia   . Migraines   . Insomnia   . Chronic constipation   . Asthma   . History of benign esophageal tumor 2010    granular cell esophageal tumor (Dx 06/2008), resected via EMR 2011, due repeat EGD 02/2012  . Chest discomfort   . Hiatal hernia   . Depression   . Anxiety    Past Surgical History  Procedure Laterality Date  . Tonsillectomy    . Abdominal hysterectomy    . Total knee arthroplasty  2002    Right; previous arthroscopic surgery  . Shoulder arthroscopy      Right; bone spurs removed  . Lung biopsy      negative  . Breast excisional biopsy   1990s, 2012    Left x2-sclerosing ductal papilloma-2012  . Right oophorectomy      benign disease  . Colonoscopy  06/2008, 06/2011    sigmoid tics, tubular adenoma; 2013: anal canal hemorrhoids  . Eus  08/2010    Dr [facility] with EGD. Retained food. No recurrent esophageal lesion, bx negative.  . Colonoscopy  07/10/2011    Anal canal hemorrhoids likely the cause of hematochezia in the setting of constipation; otherwise normal rectum ;submucosal  petechiae in left colon of doubtful clinical significance; otherwise, normal colon  . Esophagogastroduodenoscopy  12/11/2011    Procedure: ESOPHAGOGASTRODUODENOSCOPY (EGD);  Surgeon: Corbin Ade, MD;  Location: [facility];  Service: Endoscopy;  Laterality: N/A;  11:00  . Bravo ph study  12/11/2011    Procedure: BRAVO PH STUDY;  Surgeon: Corbin Ade, MD;  Location: [facility];  Service: Endoscopy;  Laterality: N/A;   Social History:  reports that she has never smoked. She has never used smokeless tobacco. She reports that she does not drink alcohol or use illicit drugs.  Allergies  Allergen Reactions  . Elavil (Amitriptyline) Other (See Comments)    Felt really nervous and felt like something was hold her feet or arm and/ or AM headache on it and back on it and went away off it.   . Abilify (Aripiprazole) Other (See Comments)  Dystonic reaction  . Trazodone And Nefazodone Other (See Comments)    Brought back bad dreams of things in the past  . Codeine Hives, Nausea Only and Other (See Comments)    Feels funny  . Latex Hives  . Penicillins Hives  . Polyethylene Glycol     Per allergy test  . Sulfonamide Derivatives Nausea And Vomiting  . Remeron (Mirtazapine) Other (See Comments)    Caused lots of strange, weird, crazy dreams.    Family History  Problem Relation Age of Onset  . Stroke Father   . Alcohol abuse Father   . Heart attack Mother   . Depression Mother   . Anxiety disorder Mother   . Colon cancer Paternal Aunt   .  Colon cancer Paternal Uncle   . Dementia Maternal Uncle   . ADD / ADHD Neg Hx   . Bipolar disorder Neg Hx   . Drug abuse Neg Hx   . OCD Neg Hx   . Paranoid behavior Neg Hx   . Schizophrenia Neg Hx   . Seizures Neg Hx   . Sexual abuse Neg Hx   . Physical abuse Neg Hx    Prior to Admission medications   Medication Sig Start Date End Date Taking? Authorizing Provider  albuterol (PROVENTIL HFA;VENTOLIN HFA) 108 (90 BASE) MCG/ACT inhaler Inhale 2 puffs into the lungs every 6 (six) hours as needed. For shortness of breath   Yes Historical Provider, MD  amLODipine (NORVASC) 5 MG tablet TAKE ONE (1) TABLET BY MOUTH EVERY DAY 07/31/12  Yes Kathlen Brunswick, MD  atenolol (TENORMIN) 50 MG tablet Take 50-100 mg by mouth 2 (two) times daily. 100 mg in the morning and  50 at night   Yes Historical Provider, MD  ferrous fumarate (HEMOCYTE - 106 MG FE) 325 (106 FE) MG TABS Take 1 tablet by mouth.   Yes Historical Provider, MD  gabapentin (NEURONTIN) 100 MG capsule Take 100-200 mg by mouth 3 (three) times daily. May take 3-4 more at night to help get to sleep   Yes Historical Provider, MD  HYDROcodone-acetaminophen (NORCO/VICODIN) 5-325 MG per tablet Take 1 tablet by mouth 2 (two) times daily as needed for pain.   Yes Historical Provider, MD  levocetirizine (XYZAL) 5 MG tablet Take 5 mg by mouth every evening.   Yes Historical Provider, MD  lovastatin (MEVACOR) 40 MG tablet TAKE ONE (1) TABLET AT BEDTIME 12/03/12  Yes Kathlen Brunswick, MD  potassium chloride SA (K-DUR,KLOR-CON) 20 MEQ tablet Take 1 tablet (20 mEq total) by mouth daily. 01/23/12 01/22/13 Yes Kathlen Brunswick, MD  promethazine (PHENERGAN) 25 MG tablet Take 25 mg by mouth every 6 (six) hours as needed for nausea.   Yes Historical Provider, MD  RABEprazole (ACIPHEX) 20 MG tablet Take 1 tablet (20 mg total) by mouth 2 (two) times daily. 12/08/12 12/08/13 Yes Tiffany Kocher, PA-C  terazosin (HYTRIN) 10 MG capsule Take 1 capsule (10 mg total) by mouth at  bedtime. 01/21/12 01/20/13 Yes Kathlen Brunswick, MD  traMADol (ULTRAM) 50 MG tablet Take 50 mg by mouth every 6 (six) hours as needed. For pain   Yes Historical Provider, MD  zolpidem (AMBIEN) 10 MG tablet Take 1 tablet (10 mg total) by mouth at bedtime as needed for sleep. 12/09/12  Yes Mike Craze, MD   Physical Exam: Filed Vitals:   12/26/12 0610 12/26/12 0700 12/26/12 0843 12/26/12 0900  BP: 102/47 116/63 161/89 136/75  Pulse: 93 97 109 104  Temp: 99.7 F (37.6 C)  99.7 F (37.6 C)   TempSrc: Oral  Oral   Resp: 20  12 19   Height: 5\' 3"  (1.6 m)     Weight: 92.987 kg (205 lb)     SpO2: 96% 98% 98% 97%     General:  No apparent distress  Eyes: PERRL, EOMI, no scleral icterus  ENT: moist oropharynx  Neck: supple, no JVD  Cardiovascular: regular rate without MRG; 2+ peripheral pulses  Respiratory: CTA biL, good air movement without wheezing, rhonchi or crackled  Abdomen: soft, tender to palpation epigastric area, positive bowel sounds, no guarding, no rebound  Skin: no rashes  Musculoskeletal: no peripheral edema  Psychiatric: normal mood and affect  Neurologic: CN 2-12 grossly intact, MS 5/5 in all 4  Labs on Admission:  Basic Metabolic Panel:  Recent Labs Lab 12/26/12 0649  NA 139  K 3.2*  CL 104  CO2 27  GLUCOSE 137*  BUN 16  CREATININE 0.89  CALCIUM 10.8*   Liver Function Tests:  Recent Labs Lab 12/26/12 0743  AST 247*  ALT 121*  ALKPHOS 90  BILITOT 0.8  PROT 6.5  ALBUMIN 3.8    Recent Labs Lab 12/26/12 0743  LIPASE 47   CBC:  Recent Labs Lab 12/26/12 0649  WBC 4.9  NEUTROABS 4.4  HGB 12.6  HCT 38.8  MCV 81.9  PLT 125*   Cardiac Enzymes:  Recent Labs Lab 12/26/12 0649  TROPONINI <0.30   Radiological Exams on Admission: Dg Chest Port 1 View  12/26/2012   *RADIOLOGY REPORT*  Clinical Data: Shortness of breath.  Onset of anterior chest pain. Radiates to the abdominal area.  PORTABLE CHEST - 1 VIEW  Comparison: None.   Findings: Borderline heart size with normal pulmonary vascularity. No focal consolidation or airspace disease in the lungs.  No blunting of costophrenic angles.  No pneumothorax.  Mediastinal contours appear intact.  IMPRESSION: Borderline heart size.  No evidence of active pulmonary disease.   Original Report Authenticated By: Burman Nieves, M.D.    EKG: Independently reviewed.  Assessment/Plan Active Problems:   HYPERTENSION   Gastroesophageal reflux disease   Epigastric pain   Elevated LFTs  Epigastric pain - admit to telemetry to r/o ACS, CE x 3 - with LFT elevation c/f other etiologies, check abdominal US.  - some features of pancreatitis in her pain but lipase normal, monitor clinically  GERD - continue PPI  DVT Prophylaxis - lovenox s.q.  Code Status: Presumed Full  Family Communication: none  Disposition Plan: obs  Time spent: 30  Costin M. Elvera Lennox, MD [facility] Pager [phone]  If 7PM-7AM, please contact night-coverage [email or web address] Password [email or web address] 12/26/2012, 10:19 AM

## 2012-12-26 NOTE — ED Notes (Signed)
Pt also c/o headache.  edp notified.

## 2012-12-26 NOTE — ED Provider Notes (Signed)
History    CSN: [id number] Arrival date & time 12/26/12  0605  First MD Initiated Contact with Patient 12/26/12 [phone]     Chief Complaint  Patient presents with  . Chest Pain    HPI Pt was seen at 0730.  Per pt, c/o gradual onset and persistence of constant lower mid-sternal chest "pain" that woke her up from sleep approx 0500 PTA. Describes the pain as "heaviness," with radiation into her upper abd area. EMS gave ASA and SL ntg with partial improvement in pain. Denies palpitations, no back pain, no SOB/cough, no N/V/D, no fevers.     Past Medical History  Diagnosis Date  . Hypertension   . Hyperlipidemia   . Gastroesophageal reflux disease   . Diverticulosis   . Anxiety and depression   . Adenomatous colon polyp 2006    excised in 2006 & 2010Due surveillance 06/2013  . Chronic back pain   . Thyroid nodule   . Fibromyalgia   . Migraines   . Insomnia   . Chronic constipation   . Asthma   . History of benign esophageal tumor 2010    granular cell esophageal tumor (Dx 06/2008), resected via EMR 2011, due repeat EGD 02/2012  . Chest discomfort   . Hiatal hernia   . Depression   . Anxiety    Past Surgical History  Procedure Laterality Date  . Tonsillectomy    . Abdominal hysterectomy    . Total knee arthroplasty  2002    Right; previous arthroscopic surgery  . Shoulder arthroscopy      Right; bone spurs removed  . Lung biopsy      negative  . 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 Right oophorectomy      benign disease  . Colonoscopy  06/2008, 06/2011    sigmoid tics, tubular adenoma; 2013: anal canal hemorrhoids  . Eus  08/2010    Dr [facility] with EGD. Retained food. No recurrent esophageal lesion, bx negative.  . Colonoscopy  07/10/2011    Anal canal hemorrhoids likely the cause of hematochezia in the setting of constipation; otherwise normal rectum ;submucosal  petechiae in left colon of doubtful clinical significance;  otherwise, normal colon  . Esophagogastroduodenoscopy  12/11/2011    Procedure: ESOPHAGOGASTRODUODENOSCOPY (EGD);  Surgeon: Corbin Ade, MD;  Location: [facility];  Service: Endoscopy;  Laterality: N/A;  11:00  . Bravo ph study  12/11/2011    Procedure: BRAVO PH STUDY;  Surgeon: Corbin Ade, MD;  Location: [facility];  Service: Endoscopy;  Laterality: N/A;   Family History  Problem Relation Age of Onset  . Stroke Father   . Alcohol abuse Father   . Heart attack Mother   . Depression Mother   . Anxiety disorder Mother   . Colon cancer Paternal Aunt   . Colon cancer Paternal Uncle   . Dementia Maternal Uncle   . ADD / ADHD Neg Hx   . Bipolar disorder Neg Hx   . Drug abuse Neg Hx   . OCD Neg Hx   . Paranoid behavior Neg Hx   . Schizophrenia Neg Hx   . Seizures Neg Hx   . Sexual abuse Neg Hx   . Physical abuse Neg Hx    History  Substance Use Topics  . Smoking status: Never Smoker   . Smokeless tobacco: Never Used  . Alcohol Use: No    Review of Systems\ ROS: Statement: All  systems negative except as marked or noted in the HPI; Constitutional: Negative for fever and chills. ; ; Eyes: Negative for eye pain, redness and discharge. ; ; ENMT: Negative for ear pain, hoarseness, nasal congestion, sinus pressure and sore throat. ; ; Cardiovascular: +CP. Negative for palpitations, diaphoresis, dyspnea and peripheral edema. ; ; Respiratory: Negative for cough, wheezing and stridor. ; ; Gastrointestinal: Negative for nausea, vomiting, diarrhea, abdominal pain, blood in stool, hematemesis, jaundice and rectal bleeding. . ; ; Genitourinary: Negative for dysuria, flank pain and hematuria. ; ; Musculoskeletal: Negative for back pain and neck pain. Negative for swelling and trauma.; ; Skin: Negative for pruritus, rash, abrasions, blisters, bruising and skin lesion.; ; Neuro: Negative for headache, lightheadedness and neck stiffness. Negative for weakness, altered level of consciousness ,  altered mental status, extremity weakness, paresthesias, involuntary movement, seizure and syncope.       Allergies  Elavil; Abilify; Trazodone and nefazodone; Codeine; Latex; Penicillins; Polyethylene glycol; Sulfonamide derivatives; and Remeron  Home Medications   Current Outpatient Rx  Name  Route  Sig  Dispense  Refill  . albuterol (PROVENTIL HFA;VENTOLIN HFA) 108 (90 BASE) MCG/ACT inhaler   Inhalation   Inhale 2 puffs into the lungs every 6 (six) hours as needed. For shortness of breath         . amLODipine (NORVASC) 5 MG tablet      TAKE ONE (1) TABLET BY MOUTH EVERY DAY   30 tablet   11   . atenolol (TENORMIN) 50 MG tablet   Oral   Take 50-100 mg by mouth 2 (two) times daily. 100 mg in the morning and  50 at night         . ferrous fumarate (HEMOCYTE - 106 MG FE) 325 (106 FE) MG TABS   Oral   Take 1 tablet by mouth.         . gabapentin (NEURONTIN) 100 MG capsule   Oral   Take 100-200 mg by mouth 3 (three) times daily. May take 3-4 more at night to help get to sleep         . HYDROcodone-acetaminophen (NORCO/VICODIN) 5-325 MG per tablet   Oral   Take 1 tablet by mouth 2 (two) times daily as needed for pain.         Marland Kitchen levocetirizine (XYZAL) 5 MG tablet   Oral   Take 5 mg by mouth every evening.         . lovastatin (MEVACOR) 40 MG tablet      TAKE ONE (1) TABLET AT BEDTIME   30 tablet   1   . potassium chloride SA (K-DUR,KLOR-CON) 20 MEQ tablet   Oral   Take 1 tablet (20 mEq total) by mouth daily.   30 tablet   12   . promethazine (PHENERGAN) 25 MG tablet   Oral   Take 25 mg by mouth every 6 (six) hours as needed for nausea.         . RABEprazole (ACIPHEX) 20 MG tablet   Oral   Take 1 tablet (20 mg total) by mouth 2 (two) times daily.   60 tablet   5   . terazosin (HYTRIN) 10 MG capsule   Oral   Take 1 capsule (10 mg total) by mouth at bedtime.   30 capsule   12   . traMADol (ULTRAM) 50 MG tablet   Oral   Take 50 mg by mouth  every 6 (six) hours as needed. For pain         .  zolpidem (AMBIEN) 10 MG tablet   Oral   Take 1 tablet (10 mg total) by mouth at bedtime as needed for sleep.   30 tablet   3    BP 136/75  Pulse 104  Temp(Src) 99.7 F (37.6 C) (Oral)  Resp 19  Ht 5\' 3"  (1.6 m)  Wt 205 lb (92.987 kg)  BMI 36.32 kg/m2  SpO2 97% Physical Exam 0735: Physical examination:  Nursing notes reviewed; Vital signs and O2 SAT reviewed;  Constitutional: Well developed, Well nourished, Well hydrated, In no acute distress; Head:  Normocephalic, atraumatic; Eyes: EOMI, PERRL, No scleral icterus; ENMT: Mouth and pharynx normal, Mucous membranes moist; Neck: Supple, Full range of motion, No lymphadenopathy; Cardiovascular: Regular rate and rhythm, No gallop; Respiratory: Breath sounds clear & equal bilaterally, No rales, rhonchi, wheezes.  Speaking full sentences with ease, Normal respiratory effort/excursion; Chest: Nontender, Movement normal; Abdomen: Soft, Nontender, Nondistended, Normal bowel sounds; Genitourinary: No CVA tenderness; Extremities: Pulses normal, No tenderness, No edema, No calf edema or asymmetry.; Neuro: AA&Ox3, Major CN grossly intact.  Speech clear. No gross focal motor or sensory deficits in extremities.; Skin: Color normal, Warm, Dry.; Psych:  Affect flat, poor eye contact.    ED Course  Procedures    MDM  MDM Reviewed: previous chart, nursing note and vitals Reviewed previous: labs and ECG Interpretation: labs and ECG    Date: 12/26/2012  Rate: 98  Rhythm: normal sinus rhythm  QRS Axis: normal  Intervals: PR prolonged  ST/T Wave abnormalities: nonspecific T wave changes, flattened T-waves diffusely  Conduction Disutrbances:first-degree A-V block   Narrative Interpretation:   Old EKG Reviewed: unchanged; no significant changes from previous EKG dated 09/17/2010.   Results for orders placed during the hospital encounter of 12/26/12  CBC WITH DIFFERENTIAL      Result Value Range    WBC 4.9  4.0 - 10.5 K/uL   RBC 4.74  3.87 - 5.11 MIL/uL   Hemoglobin 12.6  12.0 - 15.0 g/dL   HCT 16.1  09.6 - 04.5 %   MCV 81.9  78.0 - 100.0 fL   MCH 26.6  26.0 - 34.0 pg   MCHC 32.5  30.0 - 36.0 g/dL   RDW 40.9  81.1 - 91.4 %   Platelets 125 (*) 150 - 400 K/uL   Neutrophils Relative % 91 (*) 43 - 77 %   Neutro Abs 4.4  1.7 - 7.7 K/uL   Lymphocytes Relative 7 (*) 12 - 46 %   Lymphs Abs 0.3 (*) 0.7 - 4.0 K/uL   Monocytes Relative 1 (*) 3 - 12 %   Monocytes Absolute 0.1  0.1 - 1.0 K/uL   Eosinophils Relative 0  0 - 5 %   Eosinophils Absolute 0.0  0.0 - 0.7 K/uL   Basophils Relative 0  0 - 1 %   Basophils Absolute 0.0  0.0 - 0.1 K/uL  BASIC METABOLIC PANEL      Result Value Range   Sodium 139  135 - 145 mEq/L   Potassium 3.2 (*) 3.5 - 5.1 mEq/L   Chloride 104  96 - 112 mEq/L   CO2 27  19 - 32 mEq/L   Glucose, Bld 137 (*) 70 - 99 mg/dL   BUN 16  6 - 23 mg/dL   Creatinine, Ser 7.82  0.50 - 1.10 mg/dL   Calcium 95.6 (*) 8.4 - 10.5 mg/dL   GFR calc non Af Amer 66 (*) >90 mL/min   GFR calc Af Amer 77 (*) >  90 mL/min  URINALYSIS, ROUTINE W REFLEX MICROSCOPIC      Result Value Range   Color, Urine YELLOW  YELLOW   APPearance CLEAR  CLEAR   Specific Gravity, Urine >1.030 (*) 1.005 - 1.030   pH 5.5  5.0 - 8.0   Glucose, UA NEGATIVE  NEGATIVE mg/dL   Hgb urine dipstick NEGATIVE  NEGATIVE   Bilirubin Urine NEGATIVE  NEGATIVE   Ketones, ur NEGATIVE  NEGATIVE mg/dL   Protein, ur NEGATIVE  NEGATIVE mg/dL   Urobilinogen, UA 0.2  0.0 - 1.0 mg/dL   Nitrite NEGATIVE  NEGATIVE   Leukocytes, UA TRACE (*) NEGATIVE  TROPONIN I      Result Value Range   Troponin I <0.30  <0.30 ng/mL  LIPASE, BLOOD      Result Value Range   Lipase 47  11 - 59 U/L  HEPATIC FUNCTION PANEL      Result Value Range   Total Protein 6.5  6.0 - 8.3 g/dL   Albumin 3.8  3.5 - 5.2 g/dL   AST 308 (*) 0 - 37 U/L   ALT 121 (*) 0 - 35 U/L   Alkaline Phosphatase 90  39 - 117 U/L   Total Bilirubin 0.8  0.3 - 1.2  mg/dL   Bilirubin, Direct 0.4 (*) 0.0 - 0.3 mg/dL   Indirect Bilirubin 0.4  0.3 - 0.9 mg/dL  D-DIMER, QUANTITATIVE      Result Value Range   D-Dimer, Quant 0.31  0.00 - 0.48 ug/mL-FEU  URINE MICROSCOPIC-ADD ON      Result Value Range   Squamous Epithelial / LPF RARE  RARE   WBC, UA 3-6  <3 WBC/hpf   RBC / HPF 0-2  <3 RBC/hpf   Bacteria, UA RARE  RARE   Urine-Other MUCOUS PRESENT     Dg Chest Port 1 View 12/26/2012   *RADIOLOGY REPORT*  Clinical Data: Shortness of breath.  Onset of anterior chest pain. Radiates to the abdominal area.  PORTABLE CHEST - 1 VIEW  Comparison: None.  Findings: Borderline heart size with normal pulmonary vascularity. No focal consolidation or airspace disease in the lungs.  No blunting of costophrenic angles.  No pneumothorax.  Mediastinal contours appear intact.  IMPRESSION: Borderline heart size.  No evidence of active pulmonary disease.   Original Report Authenticated By: Burman Nieves, M.D.   Results for HAPPY, BEGEMAN (MRN [id number]) as of 12/26/2012 09:45  Ref. Range 08/25/2008 00:00 06/15/2009 00:00 12/10/2010 12:06 05/15/2011 10:50 01/21/2012 11:59 12/26/2012 07:43  AST Latest Range: 0-37 U/L 16 18 17 19 18  247 (H)  ALT Latest Range: 0-35 U/L 12 12 10 13 11  121 (H)    0920: Pt given ASA and SL ntg with improvement in her CP. LFT's elevated; no hx of same. Will obtain US abd.  Dx and testing d/w pt.  Questions answered.  Verb understanding, agreeable to observation admit.  T/C to [facility] Dr. Elvera Lennox, case discussed, including:  HPI, pertinent PM/SHx, VS/PE, dx testing, ED course and treatment:  Agreeable to observation admit, requests to write temporary orders, obtain tele bed to Dr. Letitia Neri service.        Laray Anger, DO 12/27/12 [phone]

## 2012-12-27 LAB — COMPREHENSIVE METABOLIC PANEL
ALT: 263 U/L — ABNORMAL HIGH (ref 0–35)
AST: 211 U/L — ABNORMAL HIGH (ref 0–37)
Albumin: 3.2 g/dL — ABNORMAL LOW (ref 3.5–5.2)
Alkaline Phosphatase: 96 U/L (ref 39–117)
BUN: 12 mg/dL (ref 6–23)
CO2: 24 mEq/L (ref 19–32)
Calcium: 9.8 mg/dL (ref 8.4–10.5)
Chloride: 112 mEq/L (ref 96–112)
Creatinine, Ser: 0.81 mg/dL (ref 0.50–1.10)
GFR calc Af Amer: 86 mL/min — ABNORMAL LOW (ref >90)
GFR calc non Af Amer: 74 mL/min — ABNORMAL LOW (ref >90)
Glucose, Bld: 104 mg/dL — ABNORMAL HIGH (ref 70–99)
Potassium: 3.7 mEq/L (ref 3.5–5.1)
Sodium: 142 mEq/L (ref 135–145)
Total Bilirubin: 0.8 mg/dL (ref 0.3–1.2)
Total Protein: 6 g/dL (ref 6.0–8.3)

## 2012-12-27 LAB — URINE CULTURE: Colony Count: 7000

## 2012-12-27 LAB — CBC
HCT: 37.3 % (ref 36.0–46.0)
Hemoglobin: 12.1 g/dL (ref 12.0–15.0)
MCH: 26.4 pg (ref 26.0–34.0)
MCHC: 32.4 g/dL (ref 30.0–36.0)
MCV: 81.3 fL (ref 78.0–100.0)
Platelets: 139 10*3/uL — ABNORMAL LOW (ref 150–400)
RBC: 4.59 MIL/uL (ref 3.87–5.11)
RDW: 15.3 % (ref 11.5–15.5)
WBC: 5.1 10*3/uL (ref 4.0–10.5)

## 2012-12-27 LAB — HEPATITIS C ANTIBODY: HCV Ab: NEGATIVE

## 2012-12-27 LAB — LIPASE, BLOOD: Lipase: 80 U/L — ABNORMAL HIGH (ref 11–59)

## 2012-12-27 LAB — HEPATITIS B SURFACE ANTIGEN: Hepatitis B Surface Ag: NEGATIVE

## 2012-12-27 NOTE — Progress Notes (Signed)
[id number] 

## 2012-12-28 ENCOUNTER — Observation Stay (HOSPITAL_COMMUNITY): Payer: Medicare Other

## 2012-12-28 LAB — HEPATIC FUNCTION PANEL
ALT: 162 U/L — ABNORMAL HIGH (ref 0–35)
AST: 70 U/L — ABNORMAL HIGH (ref 0–37)
Albumin: 3.2 g/dL — ABNORMAL LOW (ref 3.5–5.2)
Alkaline Phosphatase: 87 U/L (ref 39–117)
Bilirubin, Direct: 0.1 mg/dL (ref 0.0–0.3)
Indirect Bilirubin: 0.4 mg/dL (ref 0.3–0.9)
Total Bilirubin: 0.5 mg/dL (ref 0.3–1.2)
Total Protein: 5.9 g/dL — ABNORMAL LOW (ref 6.0–8.3)

## 2012-12-28 LAB — BASIC METABOLIC PANEL
BUN: 11 mg/dL (ref 6–23)
CO2: 22 mEq/L (ref 19–32)
Calcium: 10 mg/dL (ref 8.4–10.5)
Chloride: 113 mEq/L — ABNORMAL HIGH (ref 96–112)
Creatinine, Ser: 0.79 mg/dL (ref 0.50–1.10)
GFR calc Af Amer: >90 mL/min (ref >90)
GFR calc non Af Amer: 85 mL/min — ABNORMAL LOW (ref >90)
Glucose, Bld: 106 mg/dL — ABNORMAL HIGH (ref 70–99)
Potassium: 3.8 mEq/L (ref 3.5–5.1)
Sodium: 143 mEq/L (ref 135–145)

## 2012-12-28 NOTE — Progress Notes (Signed)
NAMEFRONIE, HOLSTEIN NO.:  [id number]  MEDICAL RECORD NO.:  [id number]  LOCATION:  A321                          FACILITY:  [facility]  PHYSICIAN:  Melvyn Novas, MDDATE OF BIRTH:  1945-11-13  DATE OF PROCEDURE: DATE OF DISCHARGE:                                PROGRESS NOTE   The patient has history of GERD, hypertension, depression, anxiety. Yesterday, she complained of some midsternal chest discomfort, mostly burning which went down to her epigastric area, and felt some epigastric discomfort.  She has seen in the ER at the hospital evaluated and admitted to rule out any acute coronary syndrome, as well as acid reflux symptomatology.  She has liver function test elevations and were not sure as the etiology of this could be steatohepatitis.  Her cardiac enzymes are negative x2.  Lipase is within normal limits.  The patient currently tolerating dysphagia diet.  Blood pressure 134/76, temperature 98.6, pulse 78 and regular, respiratory rate is 20, hemoglobin 12.1, troponins negative x2.  Potassium 3.2, currently controlled on potassium 20 mEq daily, atenolol 100, and Norvasc 5, as well as Zocor 20.  Lungs are clear.  No rales, wheezes, or rhonchi.  Heart, regular rhythm.  No murmurs, gallops, or rubs.  No epigastric tenderness.  No right upper quadrant tenderness.  The plan right now is to obtain repeat lipase, hepatitis B surface antigen, hepatitis C antibody.  Continue PPI, IV q.12 hours.  Continue dysphagia diet.  The patient was seen for EGD by Dr. __________ 6 months ago.  We will allow Dr. Felecia Shelling to decide a GI consult.  We will also do abdominal ultrasound.  She has not had a cholecystectomy.     Melvyn Novas, MD     RMD/MEDQ  D:  12/27/2012  T:  12/27/2012  Job:  [id number]

## 2012-12-28 NOTE — Progress Notes (Signed)
Subjective: Patient was admitted over the weekend due to epigastric and chest pain. Her LFT and lipase level was elevated.  Objective: Vital signs in last 24 hours: Temp:  [98.3 F (36.8 C)-98.7 F (37.1 C)] 98.3 F (36.8 C) (07/07 0634) Pulse Rate:  [71-85] 73 (07/07 0634) Resp:  [18-20] 18 (07/07 0634) BP: (134-150)/(72-84) 144/78 mmHg (07/07 0634) SpO2:  [96 %-100 %] 100 % (07/07 0634) Weight change:  Last BM Date: 12/25/12  Intake/Output from previous day: 07/06 0701 - 07/07 0700 In: 2308.8 [P.O.:420; I.V.:1888.8] Out: 1200 [Urine:1200]  PHYSICAL EXAM General appearance: alert and no distress Resp: clear to auscultation bilaterally Cardio: S1, S2 normal GI: soft, non-tender; bowel sounds normal; no masses,  no organomegaly Extremities: extremities normal, atraumatic, no cyanosis or edema  Lab Results:    @labtest @ [facility] No results found for this basename: PHART, PCO2, PO2ART, TCO2, HCO3,  in the last 72 hours CULTURES Recent Results (from the past 240 hour(s))  URINE CULTURE     Status: None   Collection Time    12/26/12  8:41 AM      Result Value Range Status   Specimen Description URINE, CLEAN CATCH   Final   Special Requests NONE   Final   Culture  Setup Time 12/26/2012 19:29   Final   Colony Count 7,000 COLONIES/ML   Final   Culture INSIGNIFICANT GROWTH   Final   Report Status 12/27/2012 FINAL   Final   Studies/Results: US Abdomen Limited Ruq  12/26/2012   *RADIOLOGY REPORT*  Clinical Data:  Elevated LFTs  GALLBLADDER ULTRASOUND  Comparison:  None  Findings:  Gallbladder:  The gallbladder wall is prominent measuring to 2.9 mm in thickness.  No gallstones or pericholecystic fluid.  Negative sonographic Murphy's sign.  Common Bile Duct:  Measures 7 mm in diameter  Liver:  No focal lesion identified.  Within normal limits in parenchymal echogenicity.  IMPRESSION:  1.  Prominence of the gallbladder wall.  No secondary signs of acute cholecystitis. 2.  Mild  increased caliber of the common bile duct.   Original Report Authenticated By: Signa Kell, M.D.    Medications: I have reviewed the patient's current medications.  Assesment:   Active Problems:   HYPERTENSION   Gastroesophageal reflux disease   Epigastric pain   Elevated LFTs elevated lipase   Plan: Medications reviewed Will follow ultrasound result GI consult,    LOS: 2 days   Gwendolen Hewlett 12/28/2012, 8:01 AM

## 2012-12-29 ENCOUNTER — Encounter (HOSPITAL_COMMUNITY): Payer: Self-pay | Admitting: Anesthesiology

## 2012-12-29 DIAGNOSIS — K219 Gastro-esophageal reflux disease without esophagitis: Secondary | ICD-10-CM

## 2012-12-29 DIAGNOSIS — R7401 Elevation of levels of liver transaminase levels: Secondary | ICD-10-CM

## 2012-12-29 DIAGNOSIS — R740 Nonspecific elevation of levels of transaminase and lactic acid dehydrogenase [LDH]: Secondary | ICD-10-CM

## 2012-12-29 DIAGNOSIS — K838 Other specified diseases of biliary tract: Secondary | ICD-10-CM

## 2012-12-29 LAB — HEPATIC FUNCTION PANEL
ALT: 112 U/L — ABNORMAL HIGH (ref 0–35)
AST: 31 U/L (ref 0–37)
Albumin: 3.3 g/dL — ABNORMAL LOW (ref 3.5–5.2)
Alkaline Phosphatase: 86 U/L (ref 39–117)
Bilirubin, Direct: 0.1 mg/dL (ref 0.0–0.3)
Indirect Bilirubin: 0.4 mg/dL (ref 0.3–0.9)
Total Bilirubin: 0.5 mg/dL (ref 0.3–1.2)
Total Protein: 6.2 g/dL (ref 6.0–8.3)

## 2012-12-29 MED ORDER — LEVOFLOXACIN IN D5W 500 MG/100ML IV SOLN
500.0000 mg | INTRAVENOUS | Status: DC
  Administered 2012-12-29 – 2012-12-30 (×2): 500 mg via INTRAVENOUS
  Filled 2012-12-29 (×3): qty 100

## 2012-12-29 MED ORDER — SODIUM CHLORIDE 0.9 % IV SOLN: INTRAVENOUS | Status: DC

## 2012-12-29 NOTE — Consult Note (Signed)
Reason for Consult: elevated LFTS Referring Physician:   [name] is an 67 y.o. female.  HPI: Admitted Saturday morning. She was admitted with substernal chest pain radiating into her left upper abdomen. The pain lasted for a couple of hrs. She says the NTG relieved her pain. Noted on admission transaminases were elevated. 12/26/2012 AST 247, ALT 121, ALP 90. 12/28/2012 AST 31, ALT 112, ALP 86. Numbers are trending down.  She tells me she has had pain rt upper quadrant for over a year. She denies jaundice.  Her appetite has been okay. Frequent acid reflux. Takes Aciphex for reflux. She has actually gained weight. She tells me she will gain and then loose. She takes Amitiza for her BM. She has a BM once every 3 days.  No tattoos or IV drug use. On admission she did have a fever of 100.4 Hepatitis B and C markers are negative. Total rt knee 2002. She is a patient of Dr. Jena Houston.    CMP     Component Value Date/Time   NA 143 12/28/2012 0514   K 3.8 12/28/2012 0514   CL 113* 12/28/2012 0514   CO2 22 12/28/2012 0514   GLUCOSE 106* 12/28/2012 0514   BUN 11 12/28/2012 0514   CREATININE 0.79 12/28/2012 0514   CREATININE 1.07 01/21/2012 1159   CALCIUM 10.0 12/28/2012 0514   PROT 6.2 12/29/2012 0559   ALBUMIN 3.3* 12/29/2012 0559   AST 31 12/29/2012 0559   ALT 112* 12/29/2012 0559   ALKPHOS 86 12/29/2012 0559   BILITOT 0.5 12/29/2012 0559   GFRNONAA 85* 12/28/2012 0514   GFRAA >90 12/28/2012 0514      Past Medical History  Diagnosis Date  . Hypertension   . Hyperlipidemia   . Gastroesophageal reflux disease   . Diverticulosis   . Anxiety and depression   . Adenomatous colon polyp 2006    excised in 2006 & 2010Due surveillance 06/2013  . Chronic back pain   . Thyroid nodule   . Fibromyalgia   . Migraines   . Insomnia   . Chronic constipation   . Asthma   . History of benign esophageal tumor 2010    granular cell esophageal tumor (Dx 06/2008), resected via EMR 2011, due repeat EGD 02/2012  . Chest  discomfort   . Hiatal hernia   . Depression   . Anxiety     Past Surgical History  Procedure Laterality Date  . Tonsillectomy    . Abdominal hysterectomy    . Total knee arthroplasty  2002    Right; previous arthroscopic surgery  . Shoulder arthroscopy      Right; bone spurs removed  . Lung biopsy      negative  . 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 Right oophorectomy      benign disease  . Colonoscopy  06/2008, 06/2011    sigmoid tics, tubular adenoma; 2013: anal canal hemorrhoids  . Eus  08/2010    Dr [facility] with EGD. Retained food. No recurrent esophageal lesion, bx negative.  . Colonoscopy  07/10/2011    Anal canal hemorrhoids likely the cause of hematochezia in the setting of constipation; otherwise normal rectum ;submucosal  petechiae in left colon of doubtful clinical significance; otherwise, normal colon  . Esophagogastroduodenoscopy  12/11/2011    Procedure: ESOPHAGOGASTRODUODENOSCOPY (EGD);  Surgeon: Corbin Ade, MD;  Location: [facility];  Service: Endoscopy;  Laterality: N/A;  11:00  . Bravo ph study  12/11/2011    Procedure: BRAVO PH STUDY;  Surgeon: Corbin Ade, MD;  Location: [facility];  Service: Endoscopy;  Laterality: N/A;    Family History  Problem Relation Age of Onset  . Stroke Father   . Alcohol abuse Father   . Heart attack Mother   . Depression Mother   . Anxiety disorder Mother   . Colon cancer Paternal Aunt   . Colon cancer Paternal Uncle   . Dementia Maternal Uncle   . ADD / ADHD Neg Hx   . Bipolar disorder Neg Hx   . Drug abuse Neg Hx   . OCD Neg Hx   . Paranoid behavior Neg Hx   . Schizophrenia Neg Hx   . Seizures Neg Hx   . Sexual abuse Neg Hx   . Physical abuse Neg Hx     Social History:  reports that she has never smoked. She has never used smokeless tobacco. She reports that she does not drink alcohol or use illicit drugs.  Allergies:  Allergies  Allergen Reactions  .  Elavil (Amitriptyline) Other (See Comments)    Felt really nervous and felt like something was hold her feet or arm and/ or AM headache on it and back on it and went away off it.   . Abilify (Aripiprazole) Other (See Comments)    Dystonic reaction  . Trazodone And Nefazodone Other (See Comments)    Brought back bad dreams of things in the past  . Codeine Hives, Nausea Only and Other (See Comments)    Feels funny  . Latex Hives  . Penicillins Hives  . Polyethylene Glycol     Per allergy test  . Sulfonamide Derivatives Nausea And Vomiting  . Remeron (Mirtazapine) Other (See Comments)    Caused lots of strange, weird, crazy dreams.    Medications: I have reviewed the patient's current medications.  Results for orders placed during the hospital encounter of 12/26/12 (from the past 48 hour(s))  HEPATITIS B SURFACE ANTIGEN     Status: None   Collection Time    12/27/12  1:47 PM      Result Value Range   Hepatitis B Surface Ag NEGATIVE  NEGATIVE  HEPATITIS C ANTIBODY     Status: None   Collection Time    12/27/12  1:47 PM      Result Value Range   HCV Ab NEGATIVE  NEGATIVE  LIPASE, BLOOD     Status: Abnormal   Collection Time    12/27/12  1:47 PM      Result Value Range   Lipase 80 (*) 11 - 59 U/L  HEPATIC FUNCTION PANEL     Status: Abnormal   Collection Time    12/28/12  5:14 AM      Result Value Range   Total Protein 5.9 (*) 6.0 - 8.3 g/dL   Albumin 3.2 (*) 3.5 - 5.2 g/dL   AST 70 (*) 0 - 37 U/L   ALT 162 (*) 0 - 35 U/L   Alkaline Phosphatase 87  39 - 117 U/L   Total Bilirubin 0.5  0.3 - 1.2 mg/dL   Bilirubin, Direct 0.1  0.0 - 0.3 mg/dL   Indirect Bilirubin 0.4  0.3 - 0.9 mg/dL  BASIC METABOLIC PANEL     Status: Abnormal   Collection Time    12/28/12  5:14 AM      Result Value Range   Sodium 143  135 - 145 mEq/L   Potassium 3.8  3.5 - 5.1 mEq/L   Chloride 113 (*) 96 - 112 mEq/L   CO2 22  19 - 32 mEq/L   Glucose, Bld 106 (*) 70 - 99 mg/dL   BUN 11  6 - 23 mg/dL    Creatinine, Ser 1.61  0.50 - 1.10 mg/dL   Calcium 09.6  8.4 - 04.5 mg/dL   GFR calc non Af Amer 85 (*) >90 mL/min   GFR calc Af Amer >90  >90 mL/min   Comment:            The eGFR has been calculated     using the CKD EPI equation.     This calculation has not been     validated in all clinical     situations.     eGFR's persistently     <90 mL/min signify     possible Chronic Kidney Disease.  HEPATIC FUNCTION PANEL     Status: Abnormal   Collection Time    12/29/12  5:59 AM      Result Value Range   Total Protein 6.2  6.0 - 8.3 g/dL   Albumin 3.3 (*) 3.5 - 5.2 g/dL   AST 31  0 - 37 U/L   ALT 112 (*) 0 - 35 U/L   Alkaline Phosphatase 86  39 - 117 U/L   Total Bilirubin 0.5  0.3 - 1.2 mg/dL   Bilirubin, Direct 0.1  0.0 - 0.3 mg/dL   Indirect Bilirubin 0.4  0.3 - 0.9 mg/dL    US Abdomen Complete  12/28/2012   *RADIOLOGY REPORT*  Clinical Data:  Elevated liver function tests.  COMPLETE ABDOMINAL ULTRASOUND  Comparison:  10/17/2010 and CT dated 04/10/2011.  Findings:  Gallbladder:  Borderline diffuse wall thickening measuring up to 2.9 mm in thickness.  No gallstones or pericholecystic fluid.  The patient was not tender over the gallbladder.  Common bile duct:  Mildly dilated, measuring up to 8.5 mm in diameter proximally.  Liver:  No focal lesion identified.  Within normal limits in parenchymal echogenicity.  IVC:  Appears normal.  Pancreas:  No focal abnormality seen.  Spleen:  Normal, measuring 8.6 cm in length.  Right Kidney:  Normal, measuring 10.6 cm in length.  Left Kidney:  Normal, measuring 10.3 cm in length.  Abdominal aorta:  Atheromatous plaque without aneurysm.  IMPRESSION: Mildly dilated common duct.  That this is concerning for a non- visualized distal common duct stone or mass.  Consideration of MRCP or ERCP is recommended.   Original Report Authenticated By: Beckie Salts, M.D.    ROS Blood pressure 149/89, pulse 66, temperature 98.5 F (36.9 C), temperature source Oral,  resp. rate 20, height 5\' 2"  (1.575 m), weight 205 lb 14.6 oz (93.4 kg), SpO2 99.00%. Physical Exam Alert and oriented. Skin warm and dry. Oral mucosa is moist.   . Sclera anicteric, conjunctivae is pink. Thyroid not enlarged. No cervical lymphadenopathy. Lungs clear. Heart regular rate and rhythm.  Abdomen is soft. Bowel sounds are positive. No hepatomegaly. No abdominal masses felt. No tenderness.  No edema to lower extremities.    Assessment/Plan: Elevated transaminases trending down.  Mildly dilated CBD. ? CBD stone or mass.  Will discuss with Dr. Karilyn Cota.   SETZER,TERRI W 12/29/2012, 8:39 AM   GI attending note; Patient interviewed and examined. Lab studies and ultrasound reviewed. Patient's symptoms are suggestive of choledocholithiasis or she could also have SOD dysfunction. Serum lipase is mildly elevated and what appeared to be nonspecific. Her examination does  not suggest biliary pancreatitis. Patient will need ERCP for diagnostic and therapeutic purposes which be delayed until tomorrow since she received Lovenox this morning. Procedure will be performed by Dr. Jena Houston, patient's primary gastroenterologist. Will check INR and start patient on Levaquin 500 mg IV every 24 hours

## 2012-12-29 NOTE — Progress Notes (Signed)
Subjective: Patient is resting. She feels better. Her abdominal ultrasound showed dilated CBD. Gi consult is pending. Objective: Vital signs in last 24 hours: Temp:  [98.1 F (36.7 C)-98.6 F (37 C)] 98.5 F (36.9 C) (07/08 0530) Pulse Rate:  [66-82] 66 (07/08 0530) Resp:  [18-20] 20 (07/08 0530) BP: (138-170)/(72-97) 149/89 mmHg (07/08 0530) SpO2:  [98 %-100 %] 99 % (07/08 0530) Weight change:  Last BM Date: 12/27/12  Intake/Output from previous day: 07/07 0701 - 07/08 0700 In: 3003.8 [P.O.:1200; I.V.:1803.8] Out: -   PHYSICAL EXAM General appearance: alert and no distress Resp: clear to auscultation bilaterally Cardio: S1, S2 normal GI: soft, non-tender; bowel sounds normal; no masses,  no organomegaly Extremities: extremities normal, atraumatic, no cyanosis or edema  Lab Results:    @labtest @ [facility] No results found for this basename: PHART, PCO2, PO2ART, TCO2, HCO3,  in the last 72 hours CULTURES Recent Results (from the past 240 hour(s))  URINE CULTURE     Status: None   Collection Time    12/26/12  8:41 AM      Result Value Range Status   Specimen Description URINE, CLEAN CATCH   Final   Special Requests NONE   Final   Culture  Setup Time 12/26/2012 19:29   Final   Colony Count 7,000 COLONIES/ML   Final   Culture INSIGNIFICANT GROWTH   Final   Report Status 12/27/2012 FINAL   Final   Studies/Results: US Abdomen Complete  12/28/2012   *RADIOLOGY REPORT*  Clinical Data:  Elevated liver function tests.  COMPLETE ABDOMINAL ULTRASOUND  Comparison:  10/17/2010 and CT dated 04/10/2011.  Findings:  Gallbladder:  Borderline diffuse wall thickening measuring up to 2.9 mm in thickness.  No gallstones or pericholecystic fluid.  The patient was not tender over the gallbladder.  Common bile duct:  Mildly dilated, measuring up to 8.5 mm in diameter proximally.  Liver:  No focal lesion identified.  Within normal limits in parenchymal echogenicity.  IVC:  Appears normal.  Pancreas:   No focal abnormality seen.  Spleen:  Normal, measuring 8.6 cm in length.  Right Kidney:  Normal, measuring 10.6 cm in length.  Left Kidney:  Normal, measuring 10.3 cm in length.  Abdominal aorta:  Atheromatous plaque without aneurysm.  IMPRESSION: Mildly dilated common duct.  That this is concerning for a non- visualized distal common duct stone or mass.  Consideration of MRCP or ERCP is recommended.   Original Report Authenticated By: Beckie Salts, M.D.    Medications: I have reviewed the patient's current medications.  Assesment:   Active Problems:   HYPERTENSION   Gastroesophageal reflux disease   Epigastric pain   Elevated LFTs elevated lipase   Plan: Medications reviewed GI consult pending    LOS: 3 days   [name] 12/29/2012, 7:33 AM

## 2012-12-29 NOTE — Progress Notes (Signed)
Utilization Review Complete  

## 2012-12-30 ENCOUNTER — Inpatient Hospital Stay (HOSPITAL_COMMUNITY): Payer: Medicare Other

## 2012-12-30 ENCOUNTER — Encounter (HOSPITAL_COMMUNITY)
Admission: EM | Disposition: A | Discharge status: Home / Self Care | Payer: Self-pay | Source: Home / Self Care | Attending: Internal Medicine

## 2012-12-30 DIAGNOSIS — R7989 Other specified abnormal findings of blood chemistry: Secondary | ICD-10-CM

## 2012-12-30 LAB — PROTIME-INR
INR: 1.09 (ref 0.00–1.49)
Prothrombin Time: 13.9 seconds (ref 11.6–15.2)

## 2012-12-30 LAB — HEPATIC FUNCTION PANEL
ALT: 82 U/L — ABNORMAL HIGH (ref 0–35)
AST: 22 U/L (ref 0–37)
Albumin: 3.4 g/dL — ABNORMAL LOW (ref 3.5–5.2)
Alkaline Phosphatase: 84 U/L (ref 39–117)
Bilirubin, Direct: 0.1 mg/dL (ref 0.0–0.3)
Indirect Bilirubin: 0.4 mg/dL (ref 0.3–0.9)
Total Bilirubin: 0.5 mg/dL (ref 0.3–1.2)
Total Protein: 6.2 g/dL (ref 6.0–8.3)

## 2012-12-30 SURGERY — ERCP, WITH INTERVENTION IF INDICATED
Anesthesia: General

## 2012-12-30 MED ORDER — GADOBENATE DIMEGLUMINE 529 MG/ML IV SOLN
19.0000 mL | Freq: Once | INTRAVENOUS | Status: AC | PRN
  Administered 2012-12-30: 19 mL via INTRAVENOUS

## 2012-12-30 NOTE — Care Management Note (Unsigned)
    Page 1 of 1   12/30/2012     3:13:07 PM   CARE MANAGEMENT NOTE 12/30/2012  Patient:  [name], [name]   Account Number:  [id number]  Date Initiated:  12/30/2012  Documentation initiated by:  Rosemary Holms  Subjective/Objective Assessment:   Pt admitted from home where she lives with her daughter (67yo). They "take care of each other". States she will return home and follow up later with the procedure. "polyps"     Action/Plan:   Anticipated DC Date:  12/30/2012   Anticipated DC Plan:  HOME/SELF CARE      DC Planning Services  CM consult      Choice offered to / List presented to:             Status of service:  In process, will continue to follow Medicare Important Message given?   (If response is "NO", the following Medicare IM given date fields will be blank) Date Medicare IM given:   Date Additional Medicare IM given:    Discharge Disposition:    Per UR Regulation:    If discussed at Long Length of Stay Meetings, dates discussed:    Comments:  12/30/12 Tamey Wanek Leanord Hawking RN BSN

## 2012-12-30 NOTE — Progress Notes (Signed)
Subjective: Vague, left-sided abdominal discomfort, intermittent, but no nausea, vomiting. Feels better since admission.   Objective: Vital signs in last 24 hours: Temp:  [98.3 F (36.8 C)-99.2 F (37.3 C)] 98.3 F (36.8 C) (07/09 0500) Pulse Rate:  [61-74] 61 (07/09 0500) Resp:  [17-20] 17 (07/09 0500) BP: (129-185)/(74-93) 165/79 mmHg (07/09 0500) SpO2:  [95 %-99 %] 95 % (07/09 0500) Last BM Date: 12/28/12 General:   Alert and oriented, pleasant Head:  Normocephalic and atraumatic. Eyes:  No icterus, sclera clear. Conjuctiva pink.  Heart:  S1, S2 present, no murmurs noted.  Lungs: Clear to auscultation bilaterally, without wheezing, rales, or rhonchi.  Abdomen:  Bowel sounds present, soft, very mild tenderness LUQ but no rebound or guarding, non-distended. No RUQ discomfort with palpation.  Msk:  Symmetrical without gross deformities. Normal posture. Extremities:  Without clubbing or edema. Neurologic:  Alert and  oriented x4;  grossly normal neurologically. Skin:  Warm and dry, intact without significant lesions.  Psych:  Alert and cooperative. Normal mood and affect.  Intake/Output from previous day: 07/08 0701 - 07/09 0700 In: 2484.3 [P.O.:480; I.V.:1904.3; IV Piggyback:100] Out: -  Intake/Output this shift:    BMET  Recent Labs  12/28/12 0514  NA 143  K 3.8  CL 113*  CO2 22  GLUCOSE 106*  BUN 11  CREATININE 0.79  CALCIUM 10.0   LFT  Recent Labs  12/28/12 0514 12/29/12 0559 12/30/12 0545  PROT 5.9* 6.2 6.2  ALBUMIN 3.2* 3.3* 3.4*  AST 70* 31 22  ALT 162* 112* 82*  ALKPHOS 87 86 84  BILITOT 0.5 0.5 0.5  BILIDIR 0.1 0.1 0.1  IBILI 0.4 0.4 0.4   PT/INR  Recent Labs  12/30/12 0545  LABPROT 13.9  INR 1.09   Hepatitis Panel  Recent Labs  12/27/12 1347  HEPBSAG NEGATIVE  HCVAB NEGATIVE     Studies/Results: [place] of abdomen December 28, 2012: Mildly dilated common duct. Measures up to 8.5 mm in diameter proximally. Borderline diffuse  wall-thickening of gallbladder  Assessment: 67 year old female admitted with elevated transaminases, normal bilirubin, epigastric pain, with [place] of abdomen noted mildly dilated common bile duct. She is clinically improving since admission, with LFTs continuing to trend towards baseline. Question microlithiasis as culprit; we will hold off on ERCP this morning and proceed with an MRCP first. She may ultimately need an ERCP, but this will be decided after further imaging. She has had a knee replacement, and Kathlene November in MRI is aware. No contraindication.  Plan: MRCP today Remain NPO for now Consider ERCP if evidence of choledocholithiasis on MRCP Further recommendations to follow  Nira Retort, ANP-BC [facility]  8:37 AM   LOS: 4 days    12/30/2012, 8:28 AM

## 2012-12-30 NOTE — Progress Notes (Signed)
Subjective: Patient is resting. She is scheduled for ERCP today. No new complaint. Objective: Vital signs in last 24 hours: Temp:  [98.3 F (36.8 C)-99.2 F (37.3 C)] 98.3 F (36.8 C) (07/09 0500) Pulse Rate:  [61-74] 61 (07/09 0500) Resp:  [17-20] 17 (07/09 0500) BP: (129-185)/(74-93) 165/79 mmHg (07/09 0500) SpO2:  [95 %-99 %] 95 % (07/09 0500) Weight change:  Last BM Date: 12/28/12  Intake/Output from previous day: 07/08 0701 - 07/09 0700 In: 2484.3 [P.O.:480; I.V.:1904.3; IV Piggyback:100] Out: -   PHYSICAL EXAM General appearance: alert and no distress Resp: clear to auscultation bilaterally Cardio: S1, S2 normal GI: soft, non-tender; bowel sounds normal; no masses,  no organomegaly Extremities: extremities normal, atraumatic, no cyanosis or edema  Lab Results:    @labtest @ [facility] No results found for this basename: PHART, PCO2, PO2ART, TCO2, HCO3,  in the last 72 hours CULTURES Recent Results (from the past 240 hour(s))  URINE CULTURE     Status: None   Collection Time    12/26/12  8:41 AM      Result Value Range Status   Specimen Description URINE, CLEAN CATCH   Final   Special Requests NONE   Final   Culture  Setup Time 12/26/2012 19:29   Final   Colony Count 7,000 COLONIES/ML   Final   Culture INSIGNIFICANT GROWTH   Final   Report Status 12/27/2012 FINAL   Final   Studies/Results: US Abdomen Complete  12/28/2012   *RADIOLOGY REPORT*  Clinical Data:  Elevated liver function tests.  COMPLETE ABDOMINAL ULTRASOUND  Comparison:  10/17/2010 and CT dated 04/10/2011.  Findings:  Gallbladder:  Borderline diffuse wall thickening measuring up to 2.9 mm in thickness.  No gallstones or pericholecystic fluid.  The patient was not tender over the gallbladder.  Common bile duct:  Mildly dilated, measuring up to 8.5 mm in diameter proximally.  Liver:  No focal lesion identified.  Within normal limits in parenchymal echogenicity.  IVC:  Appears normal.  Pancreas:  No focal  abnormality seen.  Spleen:  Normal, measuring 8.6 cm in length.  Right Kidney:  Normal, measuring 10.6 cm in length.  Left Kidney:  Normal, measuring 10.3 cm in length.  Abdominal aorta:  Atheromatous plaque without aneurysm.  IMPRESSION: Mildly dilated common duct.  That this is concerning for a non- visualized distal common duct stone or mass.  Consideration of MRCP or ERCP is recommended.   Original Report Authenticated By: Beckie Salts, M.D.    Medications: I have reviewed the patient's current medications.  Assesment:   Active Problems:   HYPERTENSION   Gastroesophageal reflux disease   Epigastric pain   Elevated LFTs elevated lipase Dilated CBD   Plan: Medications reviewed GI consult appreciated ERCP as planned    LOS: 4 days   [name] 12/30/2012, 7:27 AM

## 2012-12-30 NOTE — Progress Notes (Signed)
Attending note:  Reviewed MRCP with Dr. Molli Posey. Maximal diameter of common bile duct no more than 4 mm. No filling defect. The pancreas appears normal. Small gallbladder polyp likely. No biliary stones seen. Cystic duct runs parallel to common bile duct and may have contributed to an over estimation of CBD diameter on ultrasonography.  I agree, patient most likely passed a small stone. No need for an ERCP at this time. Given findings of ultrasound and MRCP, would recommend elective cholecystectomy. The patient is hungry. Abdominal pain has essentially completely resolved at this time. Will advance to a low fat diet. Surgical consultation per Dr. Felecia Shelling.

## 2012-12-31 LAB — HEPATIC FUNCTION PANEL
ALT: 65 U/L — ABNORMAL HIGH (ref 0–35)
AST: 18 U/L (ref 0–37)
Albumin: 3.7 g/dL (ref 3.5–5.2)
Alkaline Phosphatase: 89 U/L (ref 39–117)
Bilirubin, Direct: 0.1 mg/dL (ref 0.0–0.3)
Indirect Bilirubin: 0.5 mg/dL (ref 0.3–0.9)
Total Bilirubin: 0.6 mg/dL (ref 0.3–1.2)
Total Protein: 6.8 g/dL (ref 6.0–8.3)

## 2012-12-31 NOTE — Progress Notes (Signed)
Patient states understanding of discharge instructions.  

## 2012-12-31 NOTE — Discharge Summary (Signed)
Physician Discharge Summary  [name] MRN: [id number] DOB/AGE: Nov 02, 1945 67 y.o. Primary Care Physician:Jillayne Witte, MD Admit date: 12/26/2012 Discharge date: 12/31/2012    Discharge Diagnoses:   Active Problems:   HYPERTENSION   Gastroesophageal reflux disease   Epigastric pain   Elevated LFTs Dilated CBD   Medication List         albuterol 108 (90 BASE) MCG/ACT inhaler  Commonly known as:  PROVENTIL HFA;VENTOLIN HFA  Inhale 2 puffs into the lungs every 6 (six) hours as needed. For shortness of breath     amLODipine 5 MG tablet  Commonly known as:  NORVASC  TAKE ONE (1) TABLET BY MOUTH EVERY DAY     atenolol 50 MG tablet  Commonly known as:  TENORMIN  Take 50-100 mg by mouth 2 (two) times daily. 100 mg in the morning and  50 at night     BOTOX 200 UNITS Solr  Generic drug:  Botulinum Toxin Type A  Inject 200 Units into the skin every 3 (three) months.     ferrous fumarate 325 (106 FE) MG Tabs  Commonly known as:  HEMOCYTE - 106 mg FE  Take 1 tablet by mouth.     gabapentin 100 MG capsule  Commonly known as:  NEURONTIN  Take 200 mg by mouth 3 (three) times daily.     gabapentin 300 MG capsule  Commonly known as:  NEURONTIN  Take 300 mg by mouth at bedtime.     HYDROcodone-acetaminophen 5-325 MG per tablet  Commonly known as:  NORCO/VICODIN  Take 1 tablet by mouth 2 (two) times daily as needed for pain.     levocetirizine 5 MG tablet  Commonly known as:  XYZAL  Take 5 mg by mouth every evening.     lovastatin 40 MG tablet  Commonly known as:  MEVACOR  TAKE ONE (1) TABLET AT BEDTIME     montelukast 10 MG tablet  Commonly known as:  SINGULAIR  Take 1 tablet by mouth daily.     PATANASE 0.6 % Soln  Generic drug:  Olopatadine HCl  Place 1 drop into both eyes daily.     potassium chloride SA 20 MEQ tablet  Commonly known as:  K-DUR,KLOR-CON  Take 1 tablet (20 mEq total) by mouth daily.     PRESCRIPTION MEDICATION  Inject 1 Units  into the skin once. Patient received steroid injection at MD office on Thursday 12/24/12     promethazine 25 MG tablet  Commonly known as:  PHENERGAN  Take 25 mg by mouth every 6 (six) hours as needed for nausea.     RABEprazole 20 MG tablet  Commonly known as:  ACIPHEX  Take 1 tablet (20 mg total) by mouth 2 (two) times daily.     terazosin 10 MG capsule  Commonly known as:  HYTRIN  Take 1 capsule (10 mg total) by mouth at bedtime.     traMADol 50 MG tablet  Commonly known as:  ULTRAM  Take 50 mg by mouth every 6 (six) hours as needed. For pain     zolpidem 10 MG tablet  Commonly known as:  AMBIEN  Take 1 tablet (10 mg total) by mouth at bedtime as needed for sleep.        Discharged Condition: improved    Consults: GI   Significant Diagnostic Studies: US Abdomen Complete  12/28/2012   *RADIOLOGY REPORT*  Clinical Data:  Elevated liver function tests.  COMPLETE ABDOMINAL ULTRASOUND  Comparison:  10/17/2010  and CT dated 04/10/2011.  Findings:  Gallbladder:  Borderline diffuse wall thickening measuring up to 2.9 mm in thickness.  No gallstones or pericholecystic fluid.  The patient was not tender over the gallbladder.  Common bile duct:  Mildly dilated, measuring up to 8.5 mm in diameter proximally.  Liver:  No focal lesion identified.  Within normal limits in parenchymal echogenicity.  IVC:  Appears normal.  Pancreas:  No focal abnormality seen.  Spleen:  Normal, measuring 8.6 cm in length.  Right Kidney:  Normal, measuring 10.6 cm in length.  Left Kidney:  Normal, measuring 10.3 cm in length.  Abdominal aorta:  Atheromatous plaque without aneurysm.  IMPRESSION: Mildly dilated common duct.  That this is concerning for a non- visualized distal common duct stone or mass.  Consideration of MRCP or ERCP is recommended.   Original Report Authenticated By: Beckie Salts, M.D.   Mr 3d Recon At Scanner  12/30/2012   *RADIOLOGY REPORT*  Clinical Data:  .  Mild common bile duct dilatation with  elevated transaminases.  The patient is improving clinically and lab work is also improving.  MRI ABDOMEN WITHOUT AND WITH CONTRAST (MRCP)  Technique:  Multiplanar multisequence MR imaging of the abdomen was performed without and with contrast, including heavily T2-weighted images of the biliary and pancreatic ducts.  Three-dimensional MR images were rendered by post processing of the original MR data.  Contrast: 19mL MULTIHANCE GADOBENATE DIMEGLUMINE 529 MG/ML IV SOLN  Comparison:  Ultrasound exam from 12/28/2012.  Findings:  Liver size is normal at 14 cm in cranial caudal length. No focal intrahepatic parenchymal abnormality. Portal vein is patent.  The hepatic veins are patent.  Spleen is unremarkable. The stomach, duodenum, and adrenal glands are normal.  T2-weighted images show a 2 mm nodular focus along the non dependent wall of the gallbladder which is probably a tiny cholesterol polyp.  No definite gallstones.  Cystic duct is not dilated.  The common duct is not dilated, measuring 4 mm in diameter.  After confluence of the cystic and common duct, the common bile duct in the head of the pancreas also measures 4 mm in diameter.  There is no pancreatic ductal dilatation.  No mass lesion within the head of the pancreas.  Tiny cortical cysts are noted in the kidneys.  No enhancing renal mass.  No hydronephrosis.  No abdominal aortic aneurysm.  No evidence for lymphadenopathy within the visualized portions of the abdomen.  No free fluid is seen within the visualized abdomen.  IMPRESSION: No evidence for intra or extrahepatic biliary duct dilatation. No pancreatic head mass or pancreatic ductal dilatation.  Probable tiny cholesterol polyp in the gallbladder.   Original Report Authenticated By: Kennith Center, M.D.   Dg Chest Port 1 View  12/26/2012   *RADIOLOGY REPORT*  Clinical Data: Shortness of breath.  Onset of anterior chest pain. Radiates to the abdominal area.  PORTABLE CHEST - 1 VIEW  Comparison: None.   Findings: Borderline heart size with normal pulmonary vascularity. No focal consolidation or airspace disease in the lungs.  No blunting of costophrenic angles.  No pneumothorax.  Mediastinal contours appear intact.  IMPRESSION: Borderline heart size.  No evidence of active pulmonary disease.   Original Report Authenticated By: Burman Nieves, M.D.   Mr Abd W/wo Cm/mrcp  12/30/2012   *RADIOLOGY REPORT*  Clinical Data:  .  Mild common bile duct dilatation with elevated transaminases.  The patient is improving clinically and lab work is also improving.  MRI ABDOMEN WITHOUT  AND WITH CONTRAST (MRCP)  Technique:  Multiplanar multisequence MR imaging of the abdomen was performed without and with contrast, including heavily T2-weighted images of the biliary and pancreatic ducts.  Three-dimensional MR images were rendered by post processing of the original MR data.  Contrast: 19mL MULTIHANCE GADOBENATE DIMEGLUMINE 529 MG/ML IV SOLN  Comparison:  Ultrasound exam from 12/28/2012.  Findings:  Liver size is normal at 14 cm in cranial caudal length. No focal intrahepatic parenchymal abnormality. Portal vein is patent.  The hepatic veins are patent.  Spleen is unremarkable. The stomach, duodenum, and adrenal glands are normal.  T2-weighted images show a 2 mm nodular focus along the non dependent wall of the gallbladder which is probably a tiny cholesterol polyp.  No definite gallstones.  Cystic duct is not dilated.  The common duct is not dilated, measuring 4 mm in diameter.  After confluence of the cystic and common duct, the common bile duct in the head of the pancreas also measures 4 mm in diameter.  There is no pancreatic ductal dilatation.  No mass lesion within the head of the pancreas.  Tiny cortical cysts are noted in the kidneys.  No enhancing renal mass.  No hydronephrosis.  No abdominal aortic aneurysm.  No evidence for lymphadenopathy within the visualized portions of the abdomen.  No free fluid is seen within the  visualized abdomen.  IMPRESSION: No evidence for intra or extrahepatic biliary duct dilatation. No pancreatic head mass or pancreatic ductal dilatation.  Probable tiny cholesterol polyp in the gallbladder.   Original Report Authenticated By: Kennith Center, M.D.   US Abdomen Limited Ruq  12/26/2012   *RADIOLOGY REPORT*  Clinical Data:  Elevated LFTs  GALLBLADDER ULTRASOUND  Comparison:  None  Findings:  Gallbladder:  The gallbladder wall is prominent measuring to 2.9 mm in thickness.  No gallstones or pericholecystic fluid.  Negative sonographic Murphy's sign.  Common Bile Duct:  Measures 7 mm in diameter  Liver:  No focal lesion identified.  Within normal limits in parenchymal echogenicity.  IMPRESSION:  1.  Prominence of the gallbladder wall.  No secondary signs of acute cholecystitis. 2.  Mild increased caliber of the common bile duct.   Original Report Authenticated By: Signa Kell, M.D.    Lab Results: Basic Metabolic Panel: No results found for this basename: NA, K, CL, CO2, GLUCOSE, BUN, CREATININE, CALCIUM, MG, PHOS,  in the last 72 hours Liver Function Tests:  Recent Labs  12/29/12 0559 12/30/12 0545  AST 31 22  ALT 112* 82*  ALKPHOS 86 84  BILITOT 0.5 0.5  PROT 6.2 6.2  ALBUMIN 3.3* 3.4*     CBC: No results found for this basename: WBC, NEUTROABS, HGB, HCT, MCV, PLT,  in the last 72 hours  Recent Results (from the past 240 hour(s))  URINE CULTURE     Status: None   Collection Time    12/26/12  8:41 AM      Result Value Range Status   Specimen Description URINE, CLEAN CATCH   Final   Special Requests NONE   Final   Culture  Setup Time 12/26/2012 19:29   Final   Colony Count 7,000 COLONIES/ML   Final   Culture INSIGNIFICANT GROWTH   Final   Report Status 12/27/2012 FINAL   Final     Hospital Course:  This is a 67 years old female patient was admitted due to vague abdominal and chest pain. Her LFT was elevated. Ultrasound of the abdomen showed dilated CBD. GI consult  was done and MRCP was done which confirmed dilated duct without stone or obstruction. Patient improved and will discharge home in stable condition with a plan to refer to general surgery for possible elective cholecystectomy.  Discharge Exam: Blood pressure 156/72, pulse 66, temperature 98 F (36.7 C), temperature source Oral, resp. rate 20, height 5\' 2"  (1.575 m), weight 93.4 kg (205 lb 14.6 oz), SpO2 100.00%.    Disposition:  stable       Future Appointments Provider Department Dept Phone   01/22/2013 11:30 AM Kathlen Brunswick, MD [facility] [place] [phone]      Follow-up Information   Follow up with [facility], MD In 1 week.   Contact information:   [address] [place] [address] [phone]       Signed: Avon Gully  12/31/2012, 7:48 AM

## 2013-01-07 ENCOUNTER — Emergency Department (HOSPITAL_COMMUNITY): Payer: Medicare Other

## 2013-01-07 ENCOUNTER — Emergency Department (HOSPITAL_COMMUNITY)
Admission: EM | Admit: 2013-01-07 | Discharge: 2013-01-07 | Disposition: A | Discharge status: Home / Self Care | Payer: Medicare Other | Attending: Emergency Medicine | Admitting: Emergency Medicine

## 2013-01-07 ENCOUNTER — Encounter (HOSPITAL_COMMUNITY): Payer: Self-pay | Admitting: *Deleted

## 2013-01-07 DIAGNOSIS — Z8639 Personal history of other endocrine, nutritional and metabolic disease: Secondary | ICD-10-CM | POA: Insufficient documentation

## 2013-01-07 DIAGNOSIS — G43909 Migraine, unspecified, not intractable, without status migrainosus: Secondary | ICD-10-CM | POA: Insufficient documentation

## 2013-01-07 DIAGNOSIS — G4700 Insomnia, unspecified: Secondary | ICD-10-CM | POA: Insufficient documentation

## 2013-01-07 DIAGNOSIS — I10 Essential (primary) hypertension: Secondary | ICD-10-CM | POA: Insufficient documentation

## 2013-01-07 DIAGNOSIS — Z862 Personal history of diseases of the blood and blood-forming organs and certain disorders involving the immune mechanism: Secondary | ICD-10-CM | POA: Insufficient documentation

## 2013-01-07 DIAGNOSIS — Z8719 Personal history of other diseases of the digestive system: Secondary | ICD-10-CM | POA: Insufficient documentation

## 2013-01-07 DIAGNOSIS — F3289 Other specified depressive episodes: Secondary | ICD-10-CM | POA: Insufficient documentation

## 2013-01-07 DIAGNOSIS — S52123A Displaced fracture of head of unspecified radius, initial encounter for closed fracture: Secondary | ICD-10-CM | POA: Insufficient documentation

## 2013-01-07 DIAGNOSIS — Z880 Allergy status to penicillin: Secondary | ICD-10-CM | POA: Insufficient documentation

## 2013-01-07 DIAGNOSIS — J45909 Unspecified asthma, uncomplicated: Secondary | ICD-10-CM | POA: Insufficient documentation

## 2013-01-07 DIAGNOSIS — F329 Major depressive disorder, single episode, unspecified: Secondary | ICD-10-CM | POA: Insufficient documentation

## 2013-01-07 DIAGNOSIS — Z860100 Personal history of colon polyps, unspecified: Secondary | ICD-10-CM | POA: Insufficient documentation

## 2013-01-07 DIAGNOSIS — E785 Hyperlipidemia, unspecified: Secondary | ICD-10-CM | POA: Insufficient documentation

## 2013-01-07 DIAGNOSIS — W010XXA Fall on same level from slipping, tripping and stumbling without subsequent striking against object, initial encounter: Secondary | ICD-10-CM | POA: Insufficient documentation

## 2013-01-07 DIAGNOSIS — M549 Dorsalgia, unspecified: Secondary | ICD-10-CM | POA: Insufficient documentation

## 2013-01-07 DIAGNOSIS — Z8679 Personal history of other diseases of the circulatory system: Secondary | ICD-10-CM | POA: Insufficient documentation

## 2013-01-07 DIAGNOSIS — K219 Gastro-esophageal reflux disease without esophagitis: Secondary | ICD-10-CM | POA: Insufficient documentation

## 2013-01-07 DIAGNOSIS — G8929 Other chronic pain: Secondary | ICD-10-CM | POA: Insufficient documentation

## 2013-01-07 DIAGNOSIS — Z79899 Other long term (current) drug therapy: Secondary | ICD-10-CM | POA: Insufficient documentation

## 2013-01-07 DIAGNOSIS — Y9301 Activity, walking, marching and hiking: Secondary | ICD-10-CM | POA: Insufficient documentation

## 2013-01-07 DIAGNOSIS — S52121A Displaced fracture of head of right radius, initial encounter for closed fracture: Secondary | ICD-10-CM

## 2013-01-07 DIAGNOSIS — F411 Generalized anxiety disorder: Secondary | ICD-10-CM | POA: Insufficient documentation

## 2013-01-07 DIAGNOSIS — Z91040 Latex allergy status: Secondary | ICD-10-CM | POA: Insufficient documentation

## 2013-01-07 DIAGNOSIS — Y929 Unspecified place or not applicable: Secondary | ICD-10-CM | POA: Insufficient documentation

## 2013-01-07 MED ORDER — OXYCODONE-ACETAMINOPHEN 5-325 MG PO TABS
2.0000 | ORAL_TABLET | Freq: Once | ORAL | Status: AC
  Administered 2013-01-07: 2 via ORAL
  Filled 2013-01-07: qty 2

## 2013-01-07 MED ORDER — NAPROXEN 500 MG PO TABS: 500.0000 mg | ORAL_TABLET | Freq: Two times a day (BID) | ORAL | Status: DC

## 2013-01-07 MED ORDER — OXYCODONE-ACETAMINOPHEN 5-325 MG PO TABS: 2.0000 | ORAL_TABLET | ORAL | Status: DC | PRN

## 2013-01-07 NOTE — ED Notes (Signed)
Tripped on sidewalk and fell hitting R arm.  Pain in R elbow, increased w/movement.

## 2013-01-07 NOTE — ED Provider Notes (Signed)
History    This chart was scribed for Donna Roller, MD by Quintella Reichert, ED scribe.  This patient was seen in room APFT21/APFT21 and the patient's care was started at 3:34 PM.  CSN: [id number]  Arrival date & time 01/07/13  1446     Chief Complaint  Patient presents with  . Fall  . Arm Pain    The history is provided by the patient. No language interpreter was used.    HPI Comments: [name] is a 67 y.o. female who presents to the Emergency Department complaining of a fall that occurred several hours ago, with subsequent arm pain.  Pt states she was walking to her mailbox when she tripped and fell onto her right arm and bilateral knees.  She denies head impact or LOC.  She states that she immediately developed constant, moderate pain throughout the right arm but most severe at the right elbow that is exacerbated by movement.  She attempted to treat pain with one hydrocodone 2 hours ago, without relief.  She also notes mild bilateral knee pain.  She denies pain or injury to any other area.  She has been ambulatory since the fall and denies balance problems.  No neck pain, no numbness, no swelling.  Past Medical History  Diagnosis Date  . Hypertension   . Hyperlipidemia   . Gastroesophageal reflux disease   . Diverticulosis   . Anxiety and depression   . Adenomatous colon polyp 2006    excised in 2006 & 2010Due surveillance 06/2013  . Chronic back pain   . Thyroid nodule   . Fibromyalgia   . Migraines   . Insomnia   . Chronic constipation   . Asthma   . History of benign esophageal tumor 2010    granular cell esophageal tumor (Dx 06/2008), resected via EMR 2011, due repeat EGD 02/2012  . Chest discomfort   . Hiatal hernia   . Depression   . Anxiety    Past Surgical History  Procedure Laterality Date  . Tonsillectomy    . Abdominal hysterectomy    . Total knee arthroplasty  2002    Right; previous arthroscopic surgery  . Shoulder arthroscopy      Right; bone  spurs removed  . Lung biopsy      negative  . 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 Right oophorectomy      benign disease  . Colonoscopy  06/2008, 06/2011    sigmoid tics, tubular adenoma; 2013: anal canal hemorrhoids  . Eus  08/2010    Dr [facility] with EGD. Retained food. No recurrent esophageal lesion, bx negative.  . Colonoscopy  07/10/2011    Anal canal hemorrhoids likely the cause of hematochezia in the setting of constipation; otherwise normal rectum ;submucosal  petechiae in left colon of doubtful clinical significance; otherwise, normal colon  . Esophagogastroduodenoscopy  12/11/2011    Procedure: ESOPHAGOGASTRODUODENOSCOPY (EGD);  Surgeon: Corbin Ade, MD;  Location: [facility];  Service: Endoscopy;  Laterality: N/A;  11:00  . Bravo ph study  12/11/2011    Procedure: BRAVO PH STUDY;  Surgeon: Corbin Ade, MD;  Location: [facility];  Service: Endoscopy;  Laterality: N/A;   Family History  Problem Relation Age of Onset  . Stroke Father   . Alcohol abuse Father   . Heart attack Mother   . Depression Mother   . Anxiety disorder Mother   . Colon cancer Paternal  Aunt   . Colon cancer Paternal Uncle   . Dementia Maternal Uncle   . ADD / ADHD Neg Hx   . Bipolar disorder Neg Hx   . Drug abuse Neg Hx   . OCD Neg Hx   . Paranoid behavior Neg Hx   . Schizophrenia Neg Hx   . Seizures Neg Hx   . Sexual abuse Neg Hx   . Physical abuse Neg Hx    History  Substance Use Topics  . Smoking status: Never Smoker   . Smokeless tobacco: Never Used  . Alcohol Use: No   OB History   Grav Para Term Preterm Abortions TAB SAB Ect Mult Living                  Review of Systems  Musculoskeletal: Positive for arthralgias.  Skin: Negative for wound.  Neurological: Negative for weakness.     Allergies  Elavil; Abilify; Trazodone and nefazodone; Codeine; Latex; Penicillins; Polyethylene glycol; Sulfonamide derivatives; and  Remeron  Home Medications   Current Outpatient Rx  Name  Route  Sig  Dispense  Refill  . albuterol (PROVENTIL HFA;VENTOLIN HFA) 108 (90 BASE) MCG/ACT inhaler   Inhalation   Inhale 2 puffs into the lungs every 6 (six) hours as needed. For shortness of breath         . amLODipine (NORVASC) 5 MG tablet      TAKE ONE (1) TABLET BY MOUTH EVERY DAY   30 tablet   11   . atenolol (TENORMIN) 50 MG tablet   Oral   Take 50-100 mg by mouth 2 (two) times daily. 100 mg in the morning and  50 at night         . BOTOX 200 UNITS SOLR   Subcutaneous   Inject 200 Units into the skin every 3 (three) months.         . ferrous fumarate (HEMOCYTE - 106 MG FE) 325 (106 FE) MG TABS   Oral   Take 1 tablet by mouth.         . gabapentin (NEURONTIN) 100 MG capsule   Oral   Take 200 mg by mouth 3 (three) times daily.         Marland Kitchen gabapentin (NEURONTIN) 300 MG capsule   Oral   Take 300 mg by mouth at bedtime.         Marland Kitchen HYDROcodone-acetaminophen (NORCO/VICODIN) 5-325 MG per tablet   Oral   Take 1 tablet by mouth 2 (two) times daily as needed for pain.         Marland Kitchen levocetirizine (XYZAL) 5 MG tablet   Oral   Take 5 mg by mouth every evening.         . lovastatin (MEVACOR) 40 MG tablet      TAKE ONE (1) TABLET AT BEDTIME   30 tablet   1   . montelukast (SINGULAIR) 10 MG tablet   Oral   Take 1 tablet by mouth daily.         . naproxen (NAPROSYN) 500 MG tablet   Oral   Take 1 tablet (500 mg total) by mouth 2 (two) times daily with a meal.   30 tablet   0   . oxyCODONE-acetaminophen (PERCOCET/ROXICET) 5-325 MG per tablet   Oral   Take 2 tablets by mouth every 4 (four) hours as needed for pain.   15 tablet   0   . PATANASE 0.6 % SOLN   Both Eyes  Place 1 drop into both eyes daily.         . potassium chloride SA (K-DUR,KLOR-CON) 20 MEQ tablet   Oral   Take 1 tablet (20 mEq total) by mouth daily.   30 tablet   12   . PRESCRIPTION MEDICATION   Subcutaneous    Inject 1 Units into the skin once. Patient received steroid injection at MD office on Thursday 12/24/12         . promethazine (PHENERGAN) 25 MG tablet   Oral   Take 25 mg by mouth every 6 (six) hours as needed for nausea.         . RABEprazole (ACIPHEX) 20 MG tablet   Oral   Take 1 tablet (20 mg total) by mouth 2 (two) times daily.   60 tablet   5   . terazosin (HYTRIN) 10 MG capsule   Oral   Take 1 capsule (10 mg total) by mouth at bedtime.   30 capsule   12   . traMADol (ULTRAM) 50 MG tablet   Oral   Take 50 mg by mouth every 6 (six) hours as needed. For pain         . zolpidem (AMBIEN) 10 MG tablet   Oral   Take 1 tablet (10 mg total) by mouth at bedtime as needed for sleep.   30 tablet   3    BP 144/72  Pulse 56  Temp(Src) 98.4 F (36.9 C) (Oral)  Resp 16  Ht 5\' 2"  (1.575 m)  Wt 204 lb (92.534 kg)  BMI 37.3 kg/m2  SpO2 98%  Physical Exam  Nursing note and vitals reviewed. Constitutional: She appears well-developed and well-nourished. No distress.  HENT:  Head: Normocephalic and atraumatic.  Mouth/Throat: Oropharynx is clear and moist. No oropharyngeal exudate.  Eyes: Conjunctivae and EOM are normal. Pupils are equal, round, and reactive to light. Right eye exhibits no discharge. Left eye exhibits no discharge. No scleral icterus.  Neck: Normal range of motion. Neck supple. No JVD present. No thyromegaly present.  Cardiovascular: Normal rate, regular rhythm, normal heart sounds and intact distal pulses.  Exam reveals no gallop and no friction rub.   No murmur heard. Pulmonary/Chest: Effort normal and breath sounds normal. No respiratory distress. She has no wheezes. She has no rales.  Abdominal: Soft. Bowel sounds are normal. She exhibits no distension and no mass. There is no tenderness.  Musculoskeletal: She exhibits tenderness. She exhibits no edema.  No pain with pronation and supination of the right forearm and elbow. Tenderness over the proximal ulna  on the extensor surface Pain at extremes of extension. Of the right upper extremity at the elbow Pain at 90 degrees of flexion. Of the right upper extremity at the elbow No pain with abduction or adduction of the shoulder, no pain at the wrist. Normal grip on the right. The patient is able to straight leg raise bilaterally without difficulty and meds both of her knees without difficulty, no obvious skin injuries, swelling, hematomas or contusions of the bilateral lower extremities especially at the knees  Lymphadenopathy:    She has no cervical adenopathy.  Neurological: She is alert. Coordination normal.  Motor sensation intact to right upper extremity.  Skin: Skin is warm and dry. No rash noted. No erythema.  Normal skin, no abrasions, no contusion.  Psychiatric: She has a normal mood and affect. Her behavior is normal.    ED Course  Procedures (including critical care time)  DIAGNOSTIC STUDIES: Oxygen  Saturation is 98% on room air, normal by my interpretation.    COORDINATION OF CARE: 3:37 PM-Discussed treatment plan which includes pain medication and elbow x-ray with pt at bedside and pt agreed to plan.     Labs Reviewed - No data to display Dg Elbow Complete Right  01/07/2013   *RADIOLOGY REPORT*  Clinical Data: Larey Seat and landed on the right elbow.  RIGHT ELBOW - COMPLETE 3+ VIEW  Comparison: None.  Findings: Possible nondisplaced fracture involving the radial head. No visible fractures elsewhere.  No evidence of dislocation.  Well- preserved joint spaces.  Well-preserved bone mineral density. Posterior fat pad on the lateral image.  IMPRESSION: Possible nondisplaced fracture involving the radial head, please correlate with point tenderness.  No visible fractures elsewhere. Elbow joint effusion/hemarthrosis with posterior fat pad.   Original Report Authenticated By: Hulan Saas, M.D.   Dg Forearm Right  01/07/2013   *RADIOLOGY REPORT*  Clinical Data: Larey Seat onto the right elbow.   Proximal forearm pain.  RIGHT FOREARM - 2 VIEW  Comparison: Right elbow x-rays obtained concurrently.  Findings: Again, possible nondisplaced fracture involving the radial head.  No fractures elsewhere involving the radius or ulna. Mild degenerative changes involving the visualized wrist joint.  IMPRESSION: Possible nondisplaced fracture involving the radial head.  No fractures elsewhere involving the radius or ulna.   Original Report Authenticated By: Hulan Saas, M.D.   1. Radial head fracture, closed, right, initial encounter     MDM  Overall the patient appears well, she does have some tenderness in her right elbow but no obvious deformity. The patient is quite obese and has a significant amount of soft tissue surrounding her elbow as it is difficult to tell clinically if there is deformity however given her range of motion I doubt any significant or large fracture. Imaging pending, pain medications ordered, patient appears clinically stable and has no focal neurologic deficit or head injury or neck pain. The cause of the fall appears to be mechanical  I have personally seen and interpreted the x-rays and agree with the radiology interpretation that there is a likely minimally or nondisplaced radial head fracture. She has a slight posterior fat pad sign consistent with a possible fracture, she will be immobilized, sling, splint, followup with orthopedics.   Meds given in ED:  Medications  oxyCODONE-acetaminophen (PERCOCET/ROXICET) 5-325 MG per tablet 2 tablet (2 tablets Oral Given 01/07/13 1546)    New Prescriptions   NAPROXEN (NAPROSYN) 500 MG TABLET    Take 1 tablet (500 mg total) by mouth 2 (two) times daily with a meal.   OXYCODONE-ACETAMINOPHEN (PERCOCET/ROXICET) 5-325 MG PER TABLET    Take 2 tablets by mouth every 4 (four) hours as needed for pain.    I personally performed the services described in this documentation, which was scribed in my presence. The recorded information has  been reviewed and is accurate.        Donna Roller, MD 01/07/13 [phone]

## 2013-01-13 ENCOUNTER — Encounter: Payer: Self-pay | Admitting: Orthopedic Surgery

## 2013-01-13 ENCOUNTER — Ambulatory Visit (INDEPENDENT_AMBULATORY_CARE_PROVIDER_SITE_OTHER): Payer: Medicare Other | Admitting: Orthopedic Surgery

## 2013-01-13 VITALS — BP 158/96 | Ht 62.0 in | Wt 205.0 lb

## 2013-01-13 DIAGNOSIS — S42401A Unspecified fracture of lower end of right humerus, initial encounter for closed fracture: Secondary | ICD-10-CM

## 2013-01-13 DIAGNOSIS — S42409A Unspecified fracture of lower end of unspecified humerus, initial encounter for closed fracture: Secondary | ICD-10-CM

## 2013-01-13 DIAGNOSIS — S52121A Displaced fracture of head of right radius, initial encounter for closed fracture: Secondary | ICD-10-CM

## 2013-01-13 DIAGNOSIS — S52123A Displaced fracture of head of unspecified radius, initial encounter for closed fracture: Secondary | ICD-10-CM

## 2013-01-13 MED ORDER — HYDROCODONE-ACETAMINOPHEN 5-325 MG PO TABS: 1.0000 | ORAL_TABLET | ORAL | Status: DC | PRN

## 2013-01-13 NOTE — Progress Notes (Signed)
  Subjective:    [name], female    DOB: 1945/09/03, 67 y.o.   MRN: [id number]  Chief Complaint  Patient presents with  . Elbow Pain    Right elbow fracture d/t injury 01/04/13    HPI 67 years old. Date of injury July 14 Mechanism fall Symptoms sharp throbbing stabbing burning pain right elbow intensity 9/10 Timing constant Associated symptoms tingling swelling decreased range of motion pain increases with movement Current pain medication hydrocodone current treatment sling   Review of Systems Positive findings fever and chills, alert vision and watering of the eyes. Chest pain and palpitations. Shortness of breath. Heartburn, nausea with vomiting constipation difficulty urinating numbness tingling dizziness nervousness anxiety depression easy bruising seasonal allergies negative findings excessive thirst musculoskeletal as stated  The past, family history and social history have been reviewed and are recorded in the corresponding sections of epic      Objective:   Physical Exam BP 158/96  Ht 5\' 2"  (1.575 m)  Wt 205 lb (92.987 kg)  BMI 37.49 kg/m2 General appearance is normal, the patient is alert and oriented x3 with normal mood and affect. She is ambulating entirely well  Her right elbow is tender she has decreased range of motion but passive range of motion is normal she has tenderness over the lateral elbow and radial head. Her elbow is reduced without instability. The wrist and hand have normal range of motion and strength muscle tone is normal in the right arm the skin is intact without laceration is a good radial and ulnar pulses normal color and shows normal sensation in the hand      Assessment & Plan:Radiographs show nondisplaced radial head fracture  Impression radial head fracture  Plan active range of motion sling support continue hydrocodone x-ray when she comes back    diagnosis radial head fracture  Plan active range of motion sling  support hydrocodone for pain x-ray when she comes back

## 2013-01-20 ENCOUNTER — Ambulatory Visit: Payer: Self-pay | Admitting: Cardiology

## 2013-01-21 ENCOUNTER — Encounter: Payer: Self-pay | Admitting: Cardiology

## 2013-01-21 ENCOUNTER — Ambulatory Visit (INDEPENDENT_AMBULATORY_CARE_PROVIDER_SITE_OTHER): Payer: Medicare Other | Admitting: Cardiology

## 2013-01-21 VITALS — BP 126/72 | HR 60 | Ht 62.5 in | Wt 201.0 lb

## 2013-01-21 DIAGNOSIS — I10 Essential (primary) hypertension: Secondary | ICD-10-CM

## 2013-01-21 DIAGNOSIS — R42 Dizziness and giddiness: Secondary | ICD-10-CM | POA: Insufficient documentation

## 2013-01-21 DIAGNOSIS — R7989 Other specified abnormal findings of blood chemistry: Secondary | ICD-10-CM

## 2013-01-21 MED ORDER — PRAZOSIN HCL 1 MG PO CAPS: 2.0000 mg | ORAL_CAPSULE | Freq: Two times a day (BID) | ORAL | Status: DC

## 2013-01-21 MED ORDER — AMLODIPINE BESYLATE 5 MG PO TABS: 2.5000 mg | ORAL_TABLET | Freq: Every day | ORAL | Status: DC

## 2013-01-21 NOTE — Assessment & Plan Note (Signed)
Blood pressure control was somewhat suboptimal in hospital and by patient's determinations at home. Her psychiatrist, Dr. Dan Humphreys, recommends prazosin, as he feels it may provide benefit for her restless leg syndrome.  I was not aware of that effect of this drug, but have no objection to trying it as an additional antihypertensive agent.

## 2013-01-21 NOTE — Patient Instructions (Addendum)
Your physician recommends that you schedule a follow-up appointment in:ONE MONTH   Your physician has recommended that you wear an event monitor. Event monitors are medical devices that record the heart's electrical activity. Doctors most often [place] these monitors to diagnose arrhythmias. Arrhythmias are problems with the speed or rhythm of the heartbeat. The monitor is a small, portable device. You can wear one while you do your normal daily activities. This is usually used to diagnose what is causing palpitations/syncope (passing out).FOR 3 WEEKS,UNTIL 02-10-13 WE WILL CALL YOU WITH RESULTS  Your physician has recommended you make the following change in your medication:   1) PRAZOSIN 2MG  TWICE DAILY

## 2013-01-21 NOTE — Progress Notes (Deleted)
Name: [name]    DOB: 02-09-1946  Age: 67 y.o.  MR#: [id number]       PCP:  Avon Gully, MD      Insurance: Payor: [employer] MEDICARE / Plan: AARP MEDICARE COMPLETE / Product Type: *No Product type* /   CC:    Chief Complaint  Patient presents with  . Hypertension   LIST VS Filed Vitals:   01/21/13 1150  BP: 124/82  Pulse: 60  Height: 5' 2.5" (1.588 m)  Weight: 201 lb (91.173 kg)    Weights Current Weight  01/21/13 201 lb (91.173 kg)  01/13/13 205 lb (92.987 kg)  01/07/13 204 lb (92.534 kg)    Blood Pressure  BP Readings from Last 3 Encounters:  01/21/13 124/82  01/13/13 158/96  01/07/13 144/72     Admit date:  (Not on file) Last encounter with RMR:  01/20/2013   Allergy Elavil; Abilify; Trazodone and nefazodone; Codeine; Latex; Penicillins; Polyethylene glycol; Sulfonamide derivatives; and Remeron  Current Outpatient Prescriptions  Medication Sig Dispense Refill  . albuterol (PROVENTIL HFA;VENTOLIN HFA) 108 (90 BASE) MCG/ACT inhaler Inhale 2 puffs into the lungs every 6 (six) hours as needed. For shortness of breath      . atenolol (TENORMIN) 50 MG tablet Take 50-100 mg by mouth 2 (two) times daily. 100 mg in the morning and  50 at night      . BOTOX 200 UNITS SOLR Inject 200 Units into the skin every 3 (three) months.      . ferrous fumarate (HEMOCYTE - 106 MG FE) 325 (106 FE) MG TABS Take 1 tablet by mouth.      . gabapentin (NEURONTIN) 100 MG capsule Take 200 mg by mouth 3 (three) times daily.      Marland Kitchen gabapentin (NEURONTIN) 300 MG capsule Take 300 mg by mouth at bedtime.      Marland Kitchen HYDROcodone-acetaminophen (NORCO/VICODIN) 5-325 MG per tablet Take 1 tablet by mouth every 4 (four) hours as needed for pain.  60 tablet  0  . levocetirizine (XYZAL) 5 MG tablet Take 5 mg by mouth every evening.      . lovastatin (MEVACOR) 40 MG tablet TAKE ONE (1) TABLET AT BEDTIME  30 tablet  1  . montelukast (SINGULAIR) 10 MG tablet Take 1 tablet by mouth daily.      .  naproxen (NAPROSYN) 500 MG tablet Take 1 tablet (500 mg total) by mouth 2 (two) times daily with a meal.  30 tablet  0  . PATANASE 0.6 % SOLN Place 1 drop into both eyes daily.      . potassium chloride SA (K-DUR,KLOR-CON) 20 MEQ tablet Take 1 tablet (20 mEq total) by mouth daily.  30 tablet  12  . PRESCRIPTION MEDICATION Inject 1 Units into the skin once. Patient received steroid injection at MD office on Thursday 12/24/12      . RABEprazole (ACIPHEX) 20 MG tablet Take 1 tablet (20 mg total) by mouth 2 (two) times daily.  60 tablet  5  . traMADol (ULTRAM) 50 MG tablet Take 50 mg by mouth every 6 (six) hours as needed. For pain      . zolpidem (AMBIEN) 10 MG tablet Take 1 tablet (10 mg total) by mouth at bedtime as needed for sleep.  30 tablet  3  . terazosin (HYTRIN) 10 MG capsule Take 1 capsule (10 mg total) by mouth at bedtime.  30 capsule  12   No current facility-administered medications for this visit.  Discontinued Meds:    Medications Discontinued During This Encounter  Medication Reason  . amLODipine (NORVASC) 5 MG tablet Error  . promethazine (PHENERGAN) 25 MG tablet Error  . oxyCODONE-acetaminophen (PERCOCET/ROXICET) 5-325 MG per tablet Error    Patient Active Problem List   Diagnosis Date Noted  . Epigastric pain 12/26/2012  . Elevated LFTs 12/26/2012  . Drug reaction 11/25/2012  . Difficulty in walking 07/27/2012  . Insomnia due to mental disorder 05/06/2012  . History of benign esophageal tumor   . Adenomatous colon polyp   . DEGENERATIVE DISC DISEASE, LUMBOSACRAL SPINE 05/01/2010  . HYPERLIPIDEMIA 08/22/2009  . THYROID NODULE 08/16/2009  . Anxiety and depression 08/16/2009  . HYPERTENSION 08/16/2009  . Gastroesophageal reflux disease 08/16/2009  . FIBROMYALGIA 08/16/2009    LABS    Component Value Date/Time   NA 143 12/28/2012 0514   NA 142 12/27/2012 0603   NA 139 12/26/2012 0649   K 3.8 12/28/2012 0514   K 3.7 12/27/2012 0603   K 3.2* 12/26/2012 0649   CL 113*  12/28/2012 0514   CL 112 12/27/2012 0603   CL 104 12/26/2012 0649   CO2 22 12/28/2012 0514   CO2 24 12/27/2012 0603   CO2 27 12/26/2012 0649   GLUCOSE 106* 12/28/2012 0514   GLUCOSE 104* 12/27/2012 0603   GLUCOSE 137* 12/26/2012 0649   BUN 11 12/28/2012 0514   BUN 12 12/27/2012 0603   BUN 16 12/26/2012 0649   CREATININE 0.79 12/28/2012 0514   CREATININE 0.81 12/27/2012 0603   CREATININE 0.89 12/26/2012 0649   CREATININE 1.07 01/21/2012 1159   CREATININE 0.86 06/14/2011 1145   CREATININE 0.71 05/15/2011 1050   CALCIUM 10.0 12/28/2012 0514   CALCIUM 9.8 12/27/2012 0603   CALCIUM 10.8* 12/26/2012 0649   GFRNONAA 85* 12/28/2012 0514   GFRNONAA 74* 12/27/2012 0603   GFRNONAA 66* 12/26/2012 0649   GFRAA >90 12/28/2012 0514   GFRAA 86* 12/27/2012 0603   GFRAA 77* 12/26/2012 0649   CMP     Component Value Date/Time   NA 143 12/28/2012 0514   K 3.8 12/28/2012 0514   CL 113* 12/28/2012 0514   CO2 22 12/28/2012 0514   GLUCOSE 106* 12/28/2012 0514   BUN 11 12/28/2012 0514   CREATININE 0.79 12/28/2012 0514   CREATININE 1.07 01/21/2012 1159   CALCIUM 10.0 12/28/2012 0514   PROT 6.8 12/31/2012 0836   ALBUMIN 3.7 12/31/2012 0836   AST 18 12/31/2012 0836   ALT 65* 12/31/2012 0836   ALKPHOS 89 12/31/2012 0836   BILITOT 0.6 12/31/2012 0836   GFRNONAA 85* 12/28/2012 0514   GFRAA >90 12/28/2012 0514       Component Value Date/Time   WBC 5.1 12/27/2012 0603   WBC 4.9 12/26/2012 0649   WBC 4.0 05/15/2011 1050   HGB 12.1 12/27/2012 0603   HGB 12.6 12/26/2012 0649   HGB 12.3 05/15/2011 1050   HCT 37.3 12/27/2012 0603   HCT 38.8 12/26/2012 0649   HCT 39.3 05/15/2011 1050   MCV 81.3 12/27/2012 0603   MCV 81.9 12/26/2012 0649   MCV 84.7 05/15/2011 1050    Lipid Panel     Component Value Date/Time   CHOL 143 07/06/2011 0920   TRIG 68 07/06/2011 0920   HDL 54 07/06/2011 0920   CHOLHDL 2.6 07/06/2011 0920   VLDL 14 07/06/2011 0920   LDLCALC 75 07/06/2011 0920    ABG No results found for this basename: phart, pco2, pco2art, po2, po2art, hco3, tco2, acidbasedef, o2sat  Lab Results  Component Value Date   TSH 1.145 12/10/2010   BNP (last 3 results) No results found for this basename: PROBNP,  in the last 8760 hours Cardiac Panel (last 3 results) No results found for this basename: CKTOTAL, CKMB, TROPONINI, RELINDX,  in the last 72 hours  Iron/TIBC/Ferritin No results found for this basename: iron, tibc, ferritin     EKG Orders placed during the hospital encounter of 12/26/12  . ED EKG  . ED EKG  . EKG  . EKG 12-LEAD  . EKG 12-LEAD     Prior Assessment and Plan Problem List as of 01/21/2013   Insomnia due to mental disorder   Drug reaction   THYROID NODULE   HYPERLIPIDEMIA   Last Assessment & Plan   01/21/2012 Office Visit Written 01/21/2012 12:16 PM by Kathlen Brunswick, MD     Excellent control of hyperlipidemia, especially considering the absence of known vascular disease.  Current treatment with moderate dose lovastatin is quite adequate.    Anxiety and depression   HYPERTENSION   Last Assessment & Plan   01/21/2012 Office Visit Written 01/21/2012 12:15 PM by Kathlen Brunswick, MD     Adequate but not ideal blood pressure control.  Dose of Hytrin will be increased to 10 mg per day, which should provide optimal hypertension treatment.    Gastroesophageal reflux disease   Last Assessment & Plan   11/21/2011 Office Visit Edited 11/21/2011  8:56 AM by Joselyn Arrow, NP     Refractory GERD despite AcipHex 20 mg twice a day. Multiple PPI failure. History of granular cell tumor of esophagus 2010. EUS benign last year by Dr. Margaretha Glassing.  EGD with possible Bravo pH probe placement by Dr. Jena Gauss in the near future on PPI.  I have discussed risks & benefits which include, but are not limited to, bleeding, infection, perforation & drug reaction.  The patient agrees with this plan & written consent will be obtained.    Phenergan 12.5 milligrams IV will be given 30 minutes prior to procedure to augment sedation given multiple psychoactive medications.      DEGENERATIVE DISC DISEASE, LUMBOSACRAL SPINE   FIBROMYALGIA   History of benign esophageal tumor   Last Assessment & Plan   11/21/2011 Office Visit Written 11/21/2011  8:55 AM by Joselyn Arrow, NP     Scheduled for EGD as above    Adenomatous colon polyp   Difficulty in walking   Epigastric pain   Elevated LFTs       Imaging: Dg Elbow Complete Right  01/07/2013   *RADIOLOGY REPORT*  Clinical Data: Larey Seat and landed on the right elbow.  RIGHT ELBOW - COMPLETE 3+ VIEW  Comparison: None.  Findings: Possible nondisplaced fracture involving the radial head. No visible fractures elsewhere.  No evidence of dislocation.  Well- preserved joint spaces.  Well-preserved bone mineral density. Posterior fat pad on the lateral image.  IMPRESSION: Possible nondisplaced fracture involving the radial head, please correlate with point tenderness.  No visible fractures elsewhere. Elbow joint effusion/hemarthrosis with posterior fat pad.   Original Report Authenticated By: Hulan Saas, M.D.   Dg Forearm Right  01/07/2013   *RADIOLOGY REPORT*  Clinical Data: Larey Seat onto the right elbow.  Proximal forearm pain.  RIGHT FOREARM - 2 VIEW  Comparison: Right elbow x-rays obtained concurrently.  Findings: Again, possible nondisplaced fracture involving the radial head.  No fractures elsewhere involving the radius or ulna. Mild degenerative changes involving the visualized wrist joint.  IMPRESSION:  Possible nondisplaced fracture involving the radial head.  No fractures elsewhere involving the radius or ulna.   Original Report Authenticated By: Hulan Saas, M.D.   US Abdomen Complete  12/28/2012   *RADIOLOGY REPORT*  Clinical Data:  Elevated liver function tests.  COMPLETE ABDOMINAL ULTRASOUND  Comparison:  10/17/2010 and CT dated 04/10/2011.  Findings:  Gallbladder:  Borderline diffuse wall thickening measuring up to 2.9 mm in thickness.  No gallstones or pericholecystic fluid.  The patient was not tender over the  gallbladder.  Common bile duct:  Mildly dilated, measuring up to 8.5 mm in diameter proximally.  Liver:  No focal lesion identified.  Within normal limits in parenchymal echogenicity.  IVC:  Appears normal.  Pancreas:  No focal abnormality seen.  Spleen:  Normal, measuring 8.6 cm in length.  Right Kidney:  Normal, measuring 10.6 cm in length.  Left Kidney:  Normal, measuring 10.3 cm in length.  Abdominal aorta:  Atheromatous plaque without aneurysm.  IMPRESSION: Mildly dilated common duct.  That this is concerning for a non- visualized distal common duct stone or mass.  Consideration of MRCP or ERCP is recommended.   Original Report Authenticated By: Beckie Salts, M.D.   Mr 3d Recon At Scanner  12/30/2012   *RADIOLOGY REPORT*  Clinical Data:  .  Mild common bile duct dilatation with elevated transaminases.  The patient is improving clinically and lab work is also improving.  MRI ABDOMEN WITHOUT AND WITH CONTRAST (MRCP)  Technique:  Multiplanar multisequence MR imaging of the abdomen was performed without and with contrast, including heavily T2-weighted images of the biliary and pancreatic ducts.  Three-dimensional MR images were rendered by post processing of the original MR data.  Contrast: 19mL MULTIHANCE GADOBENATE DIMEGLUMINE 529 MG/ML IV SOLN  Comparison:  Ultrasound exam from 12/28/2012.  Findings:  Liver size is normal at 14 cm in cranial caudal length. No focal intrahepatic parenchymal abnormality. Portal vein is patent.  The hepatic veins are patent.  Spleen is unremarkable. The stomach, duodenum, and adrenal glands are normal.  T2-weighted images show a 2 mm nodular focus along the non dependent wall of the gallbladder which is probably a tiny cholesterol polyp.  No definite gallstones.  Cystic duct is not dilated.  The common duct is not dilated, measuring 4 mm in diameter.  After confluence of the cystic and common duct, the common bile duct in the head of the pancreas also measures 4 mm in diameter.   There is no pancreatic ductal dilatation.  No mass lesion within the head of the pancreas.  Tiny cortical cysts are noted in the kidneys.  No enhancing renal mass.  No hydronephrosis.  No abdominal aortic aneurysm.  No evidence for lymphadenopathy within the visualized portions of the abdomen.  No free fluid is seen within the visualized abdomen.  IMPRESSION: No evidence for intra or extrahepatic biliary duct dilatation. No pancreatic head mass or pancreatic ductal dilatation.  Probable tiny cholesterol polyp in the gallbladder.   Original Report Authenticated By: Kennith Center, M.D.   Dg Chest Port 1 View  12/26/2012   *RADIOLOGY REPORT*  Clinical Data: Shortness of breath.  Onset of anterior chest pain. Radiates to the abdominal area.  PORTABLE CHEST - 1 VIEW  Comparison: None.  Findings: Borderline heart size with normal pulmonary vascularity. No focal consolidation or airspace disease in the lungs.  No blunting of costophrenic angles.  No pneumothorax.  Mediastinal contours appear intact.  IMPRESSION: Borderline heart size.  No evidence of active pulmonary  disease.   Original Report Authenticated By: Burman Nieves, M.D.   Mr Abd W/wo Cm/mrcp  12/30/2012   *RADIOLOGY REPORT*  Clinical Data:  .  Mild common bile duct dilatation with elevated transaminases.  The patient is improving clinically and lab work is also improving.  MRI ABDOMEN WITHOUT AND WITH CONTRAST (MRCP)  Technique:  Multiplanar multisequence MR imaging of the abdomen was performed without and with contrast, including heavily T2-weighted images of the biliary and pancreatic ducts.  Three-dimensional MR images were rendered by post processing of the original MR data.  Contrast: 19mL MULTIHANCE GADOBENATE DIMEGLUMINE 529 MG/ML IV SOLN  Comparison:  Ultrasound exam from 12/28/2012.  Findings:  Liver size is normal at 14 cm in cranial caudal length. No focal intrahepatic parenchymal abnormality. Portal vein is patent.  The hepatic veins are patent.   Spleen is unremarkable. The stomach, duodenum, and adrenal glands are normal.  T2-weighted images show a 2 mm nodular focus along the non dependent wall of the gallbladder which is probably a tiny cholesterol polyp.  No definite gallstones.  Cystic duct is not dilated.  The common duct is not dilated, measuring 4 mm in diameter.  After confluence of the cystic and common duct, the common bile duct in the head of the pancreas also measures 4 mm in diameter.  There is no pancreatic ductal dilatation.  No mass lesion within the head of the pancreas.  Tiny cortical cysts are noted in the kidneys.  No enhancing renal mass.  No hydronephrosis.  No abdominal aortic aneurysm.  No evidence for lymphadenopathy within the visualized portions of the abdomen.  No free fluid is seen within the visualized abdomen.  IMPRESSION: No evidence for intra or extrahepatic biliary duct dilatation. No pancreatic head mass or pancreatic ductal dilatation.  Probable tiny cholesterol polyp in the gallbladder.   Original Report Authenticated By: Kennith Center, M.D.   US Abdomen Limited Ruq  12/26/2012   *RADIOLOGY REPORT*  Clinical Data:  Elevated LFTs  GALLBLADDER ULTRASOUND  Comparison:  None  Findings:  Gallbladder:  The gallbladder wall is prominent measuring to 2.9 mm in thickness.  No gallstones or pericholecystic fluid.  Negative sonographic Murphy's sign.  Common Bile Duct:  Measures 7 mm in diameter  Liver:  No focal lesion identified.  Within normal limits in parenchymal echogenicity.  IMPRESSION:  1.  Prominence of the gallbladder wall.  No secondary signs of acute cholecystitis. 2.  Mild increased caliber of the common bile duct.   Original Report Authenticated By: Signa Kell, M.D.

## 2013-01-21 NOTE — Assessment & Plan Note (Addendum)
Patient complains of fairly frequent episodes of lightheadedness. She denies vertigo. Blood pressure medications have been adjusted in the past without benefit. I doubt that there is a cardiac cause for these symptoms, but we'll undertake event recording to verify that arrhythmia is not the cause.  Lightheadedness is of significant concern, since it was associated with her fall and right radial fracture.

## 2013-01-21 NOTE — Progress Notes (Signed)
[name], female   DOB: 06-23-1946, 67 y.o.   MRN: [id number]  HPI: Scheduled return visit for this nice woman with long-standing hypertension plus additional cardiovascular risk factors but no known vascular disease. She was hospitalized earlier this month for chest and abdominal pain and found to have abnormal LFTs and a dilated common bile duct; however, ERCP was otherwise unremarkable. She fell recently fracturing her right radial head. She reports tripping over an uneven sidewalk, but also notes that she was dizzy at the time. She experiences dizziness 2 or 3 times per week with episodes lasting briefly to nearly all day. She notes no palpitations. She sometimes experiences orthostatic symptoms, but not regularly.  Current Outpatient Prescriptions  Medication Sig Dispense Refill  . albuterol (PROVENTIL HFA;VENTOLIN HFA) 108 (90 BASE) MCG/ACT inhaler Inhale 2 puffs into the lungs every 6 (six) hours as needed. For shortness of breath      . atenolol (TENORMIN) 50 MG tablet Take 50-100 mg by mouth 2 (two) times daily. 100 mg in the morning and  50 at night      . BOTOX 200 UNITS SOLR Inject 200 Units into the skin every 3 (three) months.      . ferrous fumarate (HEMOCYTE - 106 MG FE) 325 (106 FE) MG TABS Take 1 tablet by mouth.      . gabapentin (NEURONTIN) 100 MG capsule Take 200 mg by mouth 3 (three) times daily.      Marland Kitchen gabapentin (NEURONTIN) 300 MG capsule Take 300 mg by mouth at bedtime.      Marland Kitchen HYDROcodone-acetaminophen (NORCO/VICODIN) 5-325 MG per tablet Take 1 tablet by mouth every 4 (four) hours as needed for pain.  60 tablet  0  . levocetirizine (XYZAL) 5 MG tablet Take 5 mg by mouth every evening.      . lovastatin (MEVACOR) 40 MG tablet TAKE ONE (1) TABLET AT BEDTIME  30 tablet  1  . montelukast (SINGULAIR) 10 MG tablet Take 1 tablet by mouth daily.      . naproxen (NAPROSYN) 500 MG tablet Take 1 tablet (500 mg total) by mouth 2 (two) times daily with a meal.  30 tablet   0  . PATANASE 0.6 % SOLN Place 1 drop into both eyes daily.      . potassium chloride SA (K-DUR,KLOR-CON) 20 MEQ tablet Take 1 tablet (20 mEq total) by mouth daily.  30 tablet  12  . PRESCRIPTION MEDICATION Inject 1 Units into the skin once. Patient received steroid injection at MD office on Thursday 12/24/12      . RABEprazole (ACIPHEX) 20 MG tablet Take 1 tablet (20 mg total) by mouth 2 (two) times daily.  60 tablet  5  . traMADol (ULTRAM) 50 MG tablet Take 50 mg by mouth every 6 (six) hours as needed. For pain      . zolpidem (AMBIEN) 10 MG tablet Take 1 tablet (10 mg total) by mouth at bedtime as needed for sleep.  30 tablet  3  . desoximetasone (TOPICORT) 0.05 % cream       . metoCLOPramide (REGLAN) 5 MG tablet       . terazosin (HYTRIN) 10 MG capsule Take 1 capsule (10 mg total) by mouth at bedtime.  30 capsule  12   No current facility-administered medications for this visit.   Allergies  Allergen Reactions  . Elavil (Amitriptyline) Other (See Comments)    Felt really nervous and felt like something was hold her feet or  arm and/ or AM headache on it and back on it and went away off it.   . Abilify (Aripiprazole) Other (See Comments)    Dystonic reaction  . Trazodone And Nefazodone Other (See Comments)    Brought back bad dreams of things in the past  . Codeine Hives, Nausea Only and Other (See Comments)    Feels funny  . Latex Hives  . Penicillins Hives  . Polyethylene Glycol     Per allergy test  . Sulfonamide Derivatives Nausea And Vomiting  . Remeron (Mirtazapine) Other (See Comments)    Caused lots of strange, weird, crazy dreams.     Past medical history, social history, and family history reviewed and updated.  ROS: Denies chest or abdominal pain since hospital discharge. No episodes of loss of consciousness. No nausea, emesis, diarrhea or constipation. All other systems reviewed and are negative.  PHYSICAL EXAM: BP 124/82  Pulse 60  Ht 5' 2.5" (1.588 m)  Wt 91.173  kg (201 lb)  BMI 36.15 kg/m2;  Body mass index is 36.15 kg/(m^2).  No orthostatic change in blood pressure. General-Well developed; no acute distress Body habitus-moderately overweight Neck-No JVD; no carotid bruits Lungs-clear lung fields; resonant to percussion Cardiovascular-normal PMI; distant S1 and S2 Abdomen-normal bowel sounds; soft and non-tender without masses or organomegaly Musculoskeletal-No deformities, no cyanosis or clubbing Neurologic-Normal cranial nerves; symmetric strength and tone Skin-Warm, no significant lesions Extremities-distal pulses intact; no edema; right arm in a sling  EKG: Sinus bradycardia at a rate of 54 bpm; otherwise normal. No previous tracing for comparison.  Corinth Bing, MD 01/21/2013  12:12 PM  ASSESSMENT AND PLAN

## 2013-01-22 ENCOUNTER — Ambulatory Visit: Payer: Self-pay | Admitting: Cardiology

## 2013-01-26 ENCOUNTER — Ambulatory Visit (INDEPENDENT_AMBULATORY_CARE_PROVIDER_SITE_OTHER): Payer: Medicare Other | Admitting: Orthopedic Surgery

## 2013-01-26 ENCOUNTER — Ambulatory Visit (INDEPENDENT_AMBULATORY_CARE_PROVIDER_SITE_OTHER): Payer: Medicare Other

## 2013-01-26 ENCOUNTER — Encounter: Payer: Self-pay | Admitting: Orthopedic Surgery

## 2013-01-26 VITALS — BP 151/84 | Ht 62.0 in | Wt 205.0 lb

## 2013-01-26 DIAGNOSIS — S42309D Unspecified fracture of shaft of humerus, unspecified arm, subsequent encounter for fracture with routine healing: Secondary | ICD-10-CM

## 2013-01-26 DIAGNOSIS — S42401D Unspecified fracture of lower end of right humerus, subsequent encounter for fracture with routine healing: Secondary | ICD-10-CM

## 2013-01-26 MED ORDER — HYDROCODONE-ACETAMINOPHEN 7.5-325 MG PO TABS: 1.0000 | ORAL_TABLET | Freq: Four times a day (QID) | ORAL | Status: DC | PRN

## 2013-01-26 NOTE — Patient Instructions (Signed)
Trigger thumb  You have received a steroid shot. 15% of patients experience increased pain at the injection site with in the next 24 hours. This is best treated with ice and tylenol extra strength 2 tabs every 8 hours. If you are still having pain please call the office.   Knee contusion

## 2013-01-26 NOTE — Progress Notes (Signed)
[name], female   DOB: [birth year], 67 y.o.   MRN: [id number] Chief Complaint  Patient presents with  . Follow-up    2 week recheck on right elbow with xray. DOI 01-07-13.   BP 151/84  Ht 5\' 2"  (1.575 m)  Wt 205 lb (92.987 kg)  BMI 37.49 kg/m2  Right radial head fracture nondisplaced treated with sling immobilization and pain medication  Current medication Norco 5 mg not relieving pain also complains of pain in the forearm  X-ray today shows fracture healing without displacement  Clinical exam shows she does have some tenderness in the forearm but this appears to be from keeping the arm in flexion  We will remove the splint today. We will increase her pain medication to Norco 7.5 mg. Followup in 4 weeks.

## 2013-02-05 ENCOUNTER — Other Ambulatory Visit: Payer: Self-pay | Admitting: Cardiology

## 2013-02-17 ENCOUNTER — Other Ambulatory Visit: Payer: Self-pay | Admitting: *Deleted

## 2013-02-17 ENCOUNTER — Ambulatory Visit: Payer: Self-pay | Admitting: Cardiology

## 2013-02-17 DIAGNOSIS — I10 Essential (primary) hypertension: Secondary | ICD-10-CM

## 2013-02-17 DIAGNOSIS — R42 Dizziness and giddiness: Secondary | ICD-10-CM

## 2013-02-23 ENCOUNTER — Ambulatory Visit (INDEPENDENT_AMBULATORY_CARE_PROVIDER_SITE_OTHER): Payer: Medicare Other | Admitting: Orthopedic Surgery

## 2013-02-23 ENCOUNTER — Encounter: Payer: Self-pay | Admitting: Orthopedic Surgery

## 2013-02-23 VITALS — BP 165/85 | Ht 62.0 in | Wt 205.0 lb

## 2013-02-23 DIAGNOSIS — S52121D Displaced fracture of head of right radius, subsequent encounter for closed fracture with routine healing: Secondary | ICD-10-CM

## 2013-02-23 DIAGNOSIS — S5290XD Unspecified fracture of unspecified forearm, subsequent encounter for closed fracture with routine healing: Secondary | ICD-10-CM

## 2013-02-23 NOTE — Patient Instructions (Addendum)
maxfreeze apply 3 times a day to painful areas   Shoulder Range of Motion Exercises The shoulder is the most flexible joint in the human body. Because of this it is also the most unstable joint in the body. All ages can develop shoulder problems. Early treatment of problems is necessary for a good outcome. People react to shoulder pain by decreasing the movement of the joint. After a brief period of time, the shoulder can become "frozen". This is an almost complete loss of the ability to move the damaged shoulder. Following injuries your caregivers can give you instructions on exercises to keep your range of motion (ability to move your shoulder freely), or regain it if it has been lost.  EXERCISES EXERCISES TO MAINTAIN THE MOBILITY OF YOUR SHOULDER: Codman's Exercise or Pendulum Exercise  This exercise may be performed in a prone (face-down) lying position or standing while leaning on a chair with the opposite arm. Its purpose is to relax the muscles in your shoulder and slowly but surely increase the range of motion and to relieve pain.  Lie on your stomach close to the side edge of the bed. Let your weak arm hang over the edge of the bed. Relax your shoulder, arm and hand. Let your shoulder blade relax and drop down.  Slowly and gently swing your arm forward and back. Do not use your neck muscles; relax them. It might be easier to have someone else gently start swinging your arm.  As pain decreases, increase your swing. To start, arm swing should begin at 15 degree angles. In time and as pain lessens, move to 30-45 degree angles. Start with swinging for about 15 seconds, and work towards swinging for 3 to 5 minutes.  This exercise may also be performed in a standing/bent over position.  Stand and hold onto a sturdy chair with your good arm. Bend forward at the waist and bend your knees slightly to help protect your back. Relax your weak arm, let it hang limp. Relax your shoulder blade and let it  drop.  Keep your shoulder relaxed and use body motion to swing your arm in small circles.  Stand up tall and relax.  Repeat motion and change direction of circles.  Start with swinging for about 30 seconds, and work towards swinging for 3 to 5 minutes. STRETCHING EXERCISES:  Lift your arm out in front of you with the elbow bent at 90 degrees. Using your other arm gently pull the elbow forward and across your body.  Bend one arm behind you with the palm facing outward. Using the other arm, hold a towel or rope and reach this arm up above your head, then bend it at the elbow to move your wrist to behind your neck. Grab the free end of the towel with the hand behind your back. Gently pull the towel up with the hand behind your neck, gradually increasing the pull on the hand behind the small of your back. Then, gradually pull down with the hand behind the small of your back. This will pull the hand and arm behind your neck further. Both shoulders will have an increased range of motion with repetition of this exercise. STRENGTHENING EXERCISES:  Standing with your arm at your side and straight out from your shoulder with the elbow bent at 90 degrees, hold onto a small weight and slowly raise your hand so it points straight up in the air. Repeat this five times to begin with, and gradually increase to  ten times. Do this four times per day. As you grow stronger you can gradually increase the weight.  Repeat the above exercise, only this time using an elastic band. Start with your hand up in the air and pull down until your hand is by your side. As you grow stronger, gradually increase the amount you pull by increasing the number or size of the elastic bands. Use the same amount of repetitions.  Standing with your hand at your side and holding onto a weight, gradually lift the hand in front of you until it is over your head. Do the same also with the hand remaining at your side and lift the hand away from  your body until it is again over your head. Repeat this five times to begin with, and gradually increase to ten times. Do this four times per day. As you grow stronger you can gradually increase the weight. Document Released: 03/09/2003 Document Revised: 09/02/2011 Document Reviewed: 06/10/2005 [facility] Patient Information 2014 [place], [place].

## 2013-02-23 NOTE — Progress Notes (Signed)
[name], female   DOB: [birth year], 67 y.o.   MRN: [id number]  Chief Complaint  Patient presents with  . Follow-up    4 week recheck right elbow fracture DOI 01/07/13    Ht 5\' 2"  (1.575 m)  Wt 205 lb (92.987 kg)  BMI 37.49 kg/m2  Encounter Diagnosis  Name Primary?  . Radial head fracture, closed, right, with routine healing, subsequent encounter Yes    The elbow is doing well she has some mild elbow pain some mild forearm pain near the distal portion of the forearm just above the wrist area on the volar side some palpable tenderness there no strength deficits no neurologic deficit she also has some pain at her deltoid insertion with some painful for elevation suggesting rotator cuff disease without tear  Recommend [facility] [employer] in physical therapy which she wants to do at home  I did not take this is a new problem is related to her fracture and her elbow  Followup with [place] as needed  OK to have gallbladder surgery

## 2013-03-04 ENCOUNTER — Ambulatory Visit (INDEPENDENT_AMBULATORY_CARE_PROVIDER_SITE_OTHER): Payer: Medicare Other | Admitting: Cardiology

## 2013-03-04 ENCOUNTER — Encounter: Payer: Self-pay | Admitting: Cardiology

## 2013-03-04 VITALS — BP 139/74 | HR 56 | Ht 62.0 in | Wt 199.8 lb

## 2013-03-04 DIAGNOSIS — R42 Dizziness and giddiness: Secondary | ICD-10-CM

## 2013-03-04 DIAGNOSIS — I10 Essential (primary) hypertension: Secondary | ICD-10-CM

## 2013-03-04 NOTE — Progress Notes (Signed)
Clinical Summary [name] is a 67 y.o.female  1. Lightheadedness  - started approx 1 year ago. Feeling of lightheadedness, but at times can feel like the room is spinning. Can happen laying down, sitting down, or standing. No association w/ exertion. No SOB, no palpitaions, no chest pain associated w/ it. Sedentary lifestyle b/c of chronic knee pain, highest level of exertion is walking around house. Stable in frequency over the last year, notes symptoms feels slightly worst to her. No history of syncope.  -bp meds altered multiple times in the past w/o benefit per notes - event monitor shows just mild sinus brady which is her baseline - fall w/ radial fracture, described as mechanical fall. Tripped over uneven sidewalk but does report she was dizzy at the time.  - event monitor shows baseline sinus brady in 50s, symptoms of dizziness correlate w/ sinus brady in 50s.    Past Medical History  Diagnosis Date  . Hypertension   . Hyperlipidemia   . Gastroesophageal reflux disease   . Diverticulosis   . Anxiety and depression   . Adenomatous colon polyp 2006    excised in 2006 & 2010Due surveillance 06/2013  . Chronic back pain   . Thyroid nodule   . Fibromyalgia   . Migraines   . Insomnia   . Chronic constipation   . Asthma   . History of benign esophageal tumor 2010    granular cell esophageal tumor (Dx 06/2008), resected via EMR 2011, due repeat EGD 02/2012  . Chest discomfort   . Hiatal hernia   . Depression   . Anxiety      Allergies  Allergen Reactions  . Elavil [Amitriptyline] Other (See Comments)    Felt really nervous and felt like something was hold her feet or arm and/ or AM headache on it and back on it and went away off it.   . Abilify [Aripiprazole] Other (See Comments)    Dystonic reaction  . Trazodone And Nefazodone Other (See Comments)    Brought back bad dreams of things in the past  . Codeine Hives, Nausea Only and Other (See Comments)    Feels funny  .  Latex Hives  . Penicillins Hives  . Polyethylene Glycol     Per allergy test  . Sulfonamide Derivatives Nausea And Vomiting  . Remeron [Mirtazapine] Other (See Comments)    Caused lots of strange, weird, crazy dreams.     Current Outpatient Prescriptions  Medication Sig Dispense Refill  . albuterol (PROVENTIL HFA;VENTOLIN HFA) 108 (90 BASE) MCG/ACT inhaler Inhale 2 puffs into the lungs every 6 (six) hours as needed. For shortness of breath      . amLODipine (NORVASC) 5 MG tablet Take 0.5 tablets (2.5 mg total) by mouth daily.  90 tablet  1  . atenolol (TENORMIN) 50 MG tablet Take 50-100 mg by mouth 2 (two) times daily. 100 mg in the morning and  50 at night      . BOTOX 200 UNITS SOLR Inject 200 Units into the skin every 3 (three) months.      . desoximetasone (TOPICORT) 0.05 % cream       . ferrous fumarate (HEMOCYTE - 106 MG FE) 325 (106 FE) MG TABS Take 1 tablet by mouth.      . gabapentin (NEURONTIN) 100 MG capsule Take 200 mg by mouth 3 (three) times daily.      Marland Kitchen gabapentin (NEURONTIN) 300 MG capsule Take 300 mg by mouth at bedtime.      Marland Kitchen  HYDROcodone-acetaminophen (NORCO) 7.5-325 MG per tablet Take 1 tablet by mouth every 6 (six) hours as needed for pain.  56 tablet  2  . KLOR-CON M20 20 MEQ tablet TAKE ONE (1) TABLET EACH DAY  30 tablet  6  . levocetirizine (XYZAL) 5 MG tablet Take 5 mg by mouth every evening.      . lovastatin (MEVACOR) 40 MG tablet TAKE ONE (1) TABLET AT BEDTIME  30 tablet  6  . metoCLOPramide (REGLAN) 5 MG tablet       . montelukast (SINGULAIR) 10 MG tablet Take 1 tablet by mouth daily.      . naproxen (NAPROSYN) 500 MG tablet Take 1 tablet (500 mg total) by mouth 2 (two) times daily with a meal.  30 tablet  0  . PATANASE 0.6 % SOLN Place 1 drop into both eyes daily.      . prazosin (MINIPRESS) 1 MG capsule Take 2 capsules (2 mg total) by mouth 2 (two) times daily.  120 capsule  6  . PRESCRIPTION MEDICATION Inject 1 Units into the skin once. Patient received  steroid injection at MD office on Thursday 12/24/12      . RABEprazole (ACIPHEX) 20 MG tablet Take 1 tablet (20 mg total) by mouth 2 (two) times daily.  60 tablet  5  . terazosin (HYTRIN) 10 MG capsule TAKE ONE CAPSULE AT BEDTIME  30 capsule  6  . traMADol (ULTRAM) 50 MG tablet Take 50 mg by mouth every 6 (six) hours as needed. For pain      . zolpidem (AMBIEN) 10 MG tablet Take 1 tablet (10 mg total) by mouth at bedtime as needed for sleep.  30 tablet  3   No current facility-administered medications for this visit.     Past Surgical History  Procedure Laterality Date  . Tonsillectomy    . Abdominal hysterectomy    . Total knee arthroplasty  2002    Right; previous arthroscopic surgery  . Shoulder arthroscopy      Right; bone spurs removed  . Lung biopsy      negative  . 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 Right oophorectomy      benign disease  . Colonoscopy  06/2008, 06/2011    sigmoid tics, tubular adenoma; 2013: anal canal hemorrhoids  . Eus  08/2010    Dr [facility] with EGD. Retained food. No recurrent esophageal lesion, bx negative.  . Colonoscopy  07/10/2011    Anal canal hemorrhoids likely the cause of hematochezia in the setting of constipation; otherwise normal rectum ;submucosal  petechiae in left colon of doubtful clinical significance; otherwise, normal colon  . Esophagogastroduodenoscopy  12/11/2011    Procedure: ESOPHAGOGASTRODUODENOSCOPY (EGD);  Surgeon: Corbin Ade, MD;  Location: [facility];  Service: Endoscopy;  Laterality: N/A;  11:00  . Bravo ph study  12/11/2011    Procedure: BRAVO PH STUDY;  Surgeon: Corbin Ade, MD;  Location: [facility];  Service: Endoscopy;  Laterality: N/A;     Allergies  Allergen Reactions  . Elavil [Amitriptyline] Other (See Comments)    Felt really nervous and felt like something was hold her feet or arm and/ or AM headache on it and back on it and went away off it.   .  Abilify [Aripiprazole] Other (See Comments)    Dystonic reaction  . Trazodone And Nefazodone Other (See Comments)    Brought back bad dreams of things in the  past  . Codeine Hives, Nausea Only and Other (See Comments)    Feels funny  . Latex Hives  . Penicillins Hives  . Polyethylene Glycol     Per allergy test  . Sulfonamide Derivatives Nausea And Vomiting  . Remeron [Mirtazapine] Other (See Comments)    Caused lots of strange, weird, crazy dreams.      Family History  Problem Relation Age of Onset  . Stroke Father   . Alcohol abuse Father   . Heart attack Mother   . Depression Mother   . Anxiety disorder Mother   . Colon cancer Paternal Aunt   . Colon cancer Paternal Uncle   . Dementia Maternal Uncle   . ADD / ADHD Neg Hx   . Bipolar disorder Neg Hx   . Drug abuse Neg Hx   . OCD Neg Hx   . Paranoid behavior Neg Hx   . Schizophrenia Neg Hx   . Seizures Neg Hx   . Sexual abuse Neg Hx   . Physical abuse Neg Hx      Social History Ms. Quarry reports that she has never smoked. She has never used smokeless tobacco. Ms. Ferrante reports that she does not drink alcohol.   Review of Systems 12 point ROS negative other than reported in HPI  Physical Examination p 139/74 p 56 Gen: NAD CV: RRR, no m/r/g, no JVD, no carotid bruits Pulm: CTAB Abd: soft, NT, ND Ext: warm, no edema Neuro:A&Ox3, no focal deficits     Diagnostic Studies 01/21/13 EKG: sinus brady rate 54, no block, no ischemic changes 02/17/13 Event monitor: baseline sinus brady in 50s, symptoms of dizziness correlate w/ sinus brady in 50s. Occasional PVCs, 1 PVC couplet.  01/2012 Carotid US: minimal plaque at bifurcations, no stenosis  Pertinent labs 12/2012: Na 142 K 3.7 Cl 112 Cr 0.8  Assessment and Plan  1. Dizziness: mixed history, she gives me symptoms of lightheadedness as well as vertigo at times. Event monitor shows mild sinus brady in mid 50s associated w/ her symptoms, however this is also her  baseline. She is on several other medications that are associated w/ dizziness. I have asked her to ask the physicians who are prescribing these meds if she could stop or try alternative (ex. Gabapentin, norco, reglan etc.) I have asked her to keep a blood pressure log, her bp in clinic is at goal but she reports elevated numbers at home sometimes. I educated her on appropriate technique on measuring her bp at home, and asked she bring her cuff w/ her next visit. She is to call in 1 week w/ her bp numbers, at that time will likely adjust meds including likely decreasing atenolol.    Antoine Poche, M.D., F.A.C.C.

## 2013-03-04 NOTE — Patient Instructions (Addendum)
Your physician has requested that you regularly monitor and record your blood pressure readings at home. Please use the same machine at the same time of day to check your readings and record them to bring to your follow-up visit.  Call office in one week to report BP reading. [phone]  Your physician recommends that you schedule a follow-up appointment in: TO BE DETERMINED

## 2013-03-10 ENCOUNTER — Ambulatory Visit (INDEPENDENT_AMBULATORY_CARE_PROVIDER_SITE_OTHER): Payer: Medicare Other | Admitting: Psychiatry

## 2013-03-10 ENCOUNTER — Ambulatory Visit (HOSPITAL_COMMUNITY): Payer: Self-pay | Admitting: Psychiatry

## 2013-03-10 ENCOUNTER — Encounter (HOSPITAL_COMMUNITY): Payer: Self-pay | Admitting: Psychiatry

## 2013-03-10 VITALS — BP 160/98 | Ht 62.0 in | Wt 196.0 lb

## 2013-03-10 DIAGNOSIS — F5105 Insomnia due to other mental disorder: Secondary | ICD-10-CM

## 2013-03-10 DIAGNOSIS — F329 Major depressive disorder, single episode, unspecified: Secondary | ICD-10-CM

## 2013-03-10 DIAGNOSIS — F32A Depression, unspecified: Secondary | ICD-10-CM

## 2013-03-10 MED ORDER — GABAPENTIN 300 MG PO CAPS: 300.0000 mg | ORAL_CAPSULE | Freq: Every day | ORAL | Status: DC

## 2013-03-10 MED ORDER — GABAPENTIN 100 MG PO CAPS: 100.0000 mg | ORAL_CAPSULE | Freq: Two times a day (BID) | ORAL | Status: DC

## 2013-03-10 MED ORDER — ESCITALOPRAM OXALATE 10 MG PO TABS: 10.0000 mg | ORAL_TABLET | Freq: Every day | ORAL | Status: DC

## 2013-03-10 MED ORDER — ZOLPIDEM TARTRATE 10 MG PO TABS: 10.0000 mg | ORAL_TABLET | Freq: Every evening | ORAL | Status: DC | PRN

## 2013-03-10 NOTE — Progress Notes (Signed)
[name], female   DOB: 10-Nov-1945, 67 y.o.   MRN: [id number] [facility] [id number] Progress Note LILIAH DORIAN MRN: [id number] DOB: February 25, 1946 Age: 67 y.o.  Date: 03/10/2013 Start Time: 9:45 AM End Time: 10:10 AM  Chief Complaint: Chief Complaint  Patient presents with  . Anxiety  . Depression  . Medication Refill    Subjective: "I've been worried lately."  This patient is a 67 year old black female who lives with her 8 year old daughter who has mild mental retardation. She lives in [place]. She is a retired Lawyer. The patient states that she's been depressed "all my life.". She can't give any specific reasons for the depression but notes that at age 41 she was already taking medication to help with sleep. In her 29s her family doctor diagnosed with schizophrenia although she had no symptoms of paranoia or delusions or hallucinations. She was given medication for this.  Since 1997 she's been coming here with symptoms of depression. She's also had significant problems with fibromyalgia and fatigue. In Neurontin and Ambien have helped. Lately however she's become more depressed and worried. She has had gallbladder surgery and she's concerned about how is going to go. For some reason she's been thinking a lot about death. She doesn't have anyone to stay with her daughter something goes wrong. She's been more negative but is densely not suicidal but her energy has dropped. In general she is a very functional person.  Current psychiatric medication Neurontin 100 mg every morning and 400 mg each bedtime Ambien 10 mg at bedtime   Vitals: BP 160/98  Ht 5\' 2"  (1.575 m)  Wt 196 lb (88.905 kg)  BMI 35.84 kg/m2  Psychiatric history Patient has a long history of depression.  She has been seeing in this office since May 1997.  Patient denies any history of suicidal attempt however endorse chronic depression and anxiety.  In the past she has taken Prozac but did not  like after taking few doses, she also has taken Seroquel, Abilify, Xanax, Pamelor, and amitriptyline.  Patient denies a history of paranoia or delusions however there are times when she has been very depressed and have a lot of negative symptoms.  Alcohol and substance use history Patient denies any history of alcohol or substance use.  Allergies: Allergies  Allergen Reactions  . Elavil [Amitriptyline] Other (See Comments)    Felt really nervous and felt like something was hold her feet or arm and/ or AM headache on it and back on it and went away off it.   . Abilify [Aripiprazole] Other (See Comments)    Dystonic reaction  . Trazodone And Nefazodone Other (See Comments)    Brought back bad dreams of things in the past  . Codeine Hives, Nausea Only and Other (See Comments)    Feels funny  . Cymbalta [Duloxetine Hcl]     rash  . Latex Hives  . Penicillins Hives  . Polyethylene Glycol     Per allergy test  . Sulfonamide Derivatives Nausea And Vomiting  . Remeron [Mirtazapine] Other (See Comments)    Caused lots of strange, weird, crazy dreams.   Medical History: Past Medical History  Diagnosis Date  . Hypertension   . Hyperlipidemia   . Gastroesophageal reflux disease   . Diverticulosis   . Anxiety and depression   . Adenomatous colon polyp 2006    excised in 2006 & 2010Due surveillance 06/2013  . Chronic back pain   . Thyroid nodule   .  Fibromyalgia   . Migraines   . Insomnia   . Chronic constipation   . Asthma   . History of benign esophageal tumor 2010    granular cell esophageal tumor (Dx 06/2008), resected via EMR 2011, due repeat EGD 02/2012  . Chest discomfort   . Hiatal hernia   . Depression   . Anxiety   Patient has history of hypertension, hyperlipidemia, GERD, diverticulosis, colonic polyp, chronic back pain, fibromyalgia, chronic constipation, benign tumor of esophagus and chronic headache.  Her primary care physician is Dr. Felecia Shelling, she sees Dr. Harrel Carina for  chronic pain, fibromyalgia and headache. Her cardiologist is Dr Mylo Red. Surgical History: Past Surgical History  Procedure Laterality Date  . Tonsillectomy    . Abdominal hysterectomy    . Total knee arthroplasty  2002    Right; previous arthroscopic surgery  . Shoulder arthroscopy      Right; bone spurs removed  . Lung biopsy      negative  . 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 Right oophorectomy      benign disease  . Colonoscopy  06/2008, 06/2011    sigmoid tics, tubular adenoma; 2013: anal canal hemorrhoids  . Eus  08/2010    Dr [facility] with EGD. Retained food. No recurrent esophageal lesion, bx negative.  . Colonoscopy  07/10/2011    Anal canal hemorrhoids likely the cause of hematochezia in the setting of constipation; otherwise normal rectum ;submucosal  petechiae in left colon of doubtful clinical significance; otherwise, normal colon  . Esophagogastroduodenoscopy  12/11/2011    Procedure: ESOPHAGOGASTRODUODENOSCOPY (EGD);  Surgeon: Corbin Ade, MD;  Location: [facility];  Service: Endoscopy;  Laterality: N/A;  11:00  . Bravo ph study  12/11/2011    Procedure: BRAVO PH STUDY;  Surgeon: Corbin Ade, MD;  Location: [facility];  Service: Endoscopy;  Laterality: N/A;   Family History: family history includes Alcohol abuse in her father; Anxiety disorder in her mother; Colon cancer in her paternal aunt and paternal uncle; Dementia in her maternal uncle; Depression in her mother; Heart attack in her mother; Stroke in her father. There is no history of ADD / ADHD, Bipolar disorder, Drug abuse, OCD, Paranoid behavior, Schizophrenia, Seizures, Sexual abuse, or Physical abuse. Reviewed and nothing new today again.  Mental status examination Patient is casually dressed and fairly groomed. She is calm cooperative and maintained fair eye contact. She described her mood is sad and worried and her affect is mood appropriate.  Her  speech is coherent but slow.  Her thought processes clear and logical. She admits to being sad but denies suicidal ideation but no plans, no active or passive homicidal thoughts. She denies any auditory or visual hallucination. There no psychotic symptoms present at this time. She's alert and oriented x3. Her insight judgment impulse control is okay  Lab Results:  Results for orders placed during the hospital encounter of 12/26/12 (from the past 8736 hour(s))  CBC WITH DIFFERENTIAL   Collection Time    12/26/12  6:49 AM      Result Value Range   WBC 4.9  4.0 - 10.5 K/uL   RBC 4.74  3.87 - 5.11 MIL/uL   Hemoglobin 12.6  12.0 - 15.0 g/dL   HCT 96.0  45.4 - 09.8 %   MCV 81.9  78.0 - 100.0 fL   MCH 26.6  26.0 - 34.0 pg   MCHC 32.5  30.0 - 36.0 g/dL  RDW 15.2  11.5 - 15.5 %   Platelets 125 (*) 150 - 400 K/uL   Neutrophils Relative % 91 (*) 43 - 77 %   Neutro Abs 4.4  1.7 - 7.7 K/uL   Lymphocytes Relative 7 (*) 12 - 46 %   Lymphs Abs 0.3 (*) 0.7 - 4.0 K/uL   Monocytes Relative 1 (*) 3 - 12 %   Monocytes Absolute 0.1  0.1 - 1.0 K/uL   Eosinophils Relative 0  0 - 5 %   Eosinophils Absolute 0.0  0.0 - 0.7 K/uL   Basophils Relative 0  0 - 1 %   Basophils Absolute 0.0  0.0 - 0.1 K/uL  BASIC METABOLIC PANEL   Collection Time    12/26/12  6:49 AM      Result Value Range   Sodium 139  135 - 145 mEq/L   Potassium 3.2 (*) 3.5 - 5.1 mEq/L   Chloride 104  96 - 112 mEq/L   CO2 27  19 - 32 mEq/L   Glucose, Bld 137 (*) 70 - 99 mg/dL   BUN 16  6 - 23 mg/dL   Creatinine, Ser 4.09  0.50 - 1.10 mg/dL   Calcium 81.1 (*) 8.4 - 10.5 mg/dL   GFR calc non Af Amer 66 (*) >90 mL/min   GFR calc Af Amer 77 (*) >90 mL/min  TROPONIN I   Collection Time    12/26/12  6:49 AM      Result Value Range   Troponin I <0.30  <0.30 ng/mL  LIPASE, BLOOD   Collection Time    12/26/12  7:43 AM      Result Value Range   Lipase 47  11 - 59 U/L  HEPATIC FUNCTION PANEL   Collection Time    12/26/12  7:43 AM       Result Value Range   Total Protein 6.5  6.0 - 8.3 g/dL   Albumin 3.8  3.5 - 5.2 g/dL   AST 914 (*) 0 - 37 U/L   ALT 121 (*) 0 - 35 U/L   Alkaline Phosphatase 90  39 - 117 U/L   Total Bilirubin 0.8  0.3 - 1.2 mg/dL   Bilirubin, Direct 0.4 (*) 0.0 - 0.3 mg/dL   Indirect Bilirubin 0.4  0.3 - 0.9 mg/dL  D-DIMER, QUANTITATIVE   Collection Time    12/26/12  7:43 AM      Result Value Range   D-Dimer, Quant 0.31  0.00 - 0.48 ug/mL-FEU  URINE CULTURE   Collection Time    12/26/12  8:41 AM      Result Value Range   Specimen Description URINE, CLEAN CATCH     Special Requests NONE     Culture  Setup Time 12/26/2012 19:29     Colony Count 7,000 COLONIES/ML     Culture INSIGNIFICANT GROWTH     Report Status 12/27/2012 FINAL    URINALYSIS, ROUTINE W REFLEX MICROSCOPIC   Collection Time    12/26/12  8:41 AM      Result Value Range   Color, Urine YELLOW  YELLOW   APPearance CLEAR  CLEAR   Specific Gravity, Urine >1.030 (*) 1.005 - 1.030   pH 5.5  5.0 - 8.0   Glucose, UA NEGATIVE  NEGATIVE mg/dL   Hgb urine dipstick NEGATIVE  NEGATIVE   Bilirubin Urine NEGATIVE  NEGATIVE   Ketones, ur NEGATIVE  NEGATIVE mg/dL   Protein, ur NEGATIVE  NEGATIVE mg/dL   Urobilinogen, UA 0.2  0.0 - 1.0 mg/dL   Nitrite NEGATIVE  NEGATIVE   Leukocytes, UA TRACE (*) NEGATIVE  URINE MICROSCOPIC-ADD ON   Collection Time    12/26/12  8:41 AM      Result Value Range   Squamous Epithelial / LPF RARE  RARE   WBC, UA 3-6  <3 WBC/hpf   RBC / HPF 0-2  <3 RBC/hpf   Bacteria, UA RARE  RARE   Urine-Other MUCOUS PRESENT    TROPONIN I   Collection Time    12/26/12 12:30 PM      Result Value Range   Troponin I <0.30  <0.30 ng/mL  TROPONIN I   Collection Time    12/26/12  5:25 PM      Result Value Range   Troponin I <0.30  <0.30 ng/mL  COMPREHENSIVE METABOLIC PANEL   Collection Time    12/27/12  6:03 AM      Result Value Range   Sodium 142  135 - 145 mEq/L   Potassium 3.7  3.5 - 5.1 mEq/L   Chloride 112  96 -  112 mEq/L   CO2 24  19 - 32 mEq/L   Glucose, Bld 104 (*) 70 - 99 mg/dL   BUN 12  6 - 23 mg/dL   Creatinine, Ser 6.21  0.50 - 1.10 mg/dL   Calcium 9.8  8.4 - 30.8 mg/dL   Total Protein 6.0  6.0 - 8.3 g/dL   Albumin 3.2 (*) 3.5 - 5.2 g/dL   AST 657 (*) 0 - 37 U/L   ALT 263 (*) 0 - 35 U/L   Alkaline Phosphatase 96  39 - 117 U/L   Total Bilirubin 0.8  0.3 - 1.2 mg/dL   GFR calc non Af Amer 74 (*) >90 mL/min   GFR calc Af Amer 86 (*) >90 mL/min  CBC   Collection Time    12/27/12  6:03 AM      Result Value Range   WBC 5.1  4.0 - 10.5 K/uL   RBC 4.59  3.87 - 5.11 MIL/uL   Hemoglobin 12.1  12.0 - 15.0 g/dL   HCT 84.6  96.2 - 95.2 %   MCV 81.3  78.0 - 100.0 fL   MCH 26.4  26.0 - 34.0 pg   MCHC 32.4  30.0 - 36.0 g/dL   RDW 84.1  32.4 - 40.1 %   Platelets 139 (*) 150 - 400 K/uL  HEPATITIS B SURFACE ANTIGEN   Collection Time    12/27/12  1:47 PM      Result Value Range   Hepatitis B Surface Ag NEGATIVE  NEGATIVE  HEPATITIS C ANTIBODY   Collection Time    12/27/12  1:47 PM      Result Value Range   HCV Ab NEGATIVE  NEGATIVE  LIPASE, BLOOD   Collection Time    12/27/12  1:47 PM      Result Value Range   Lipase 80 (*) 11 - 59 U/L  HEPATIC FUNCTION PANEL   Collection Time    12/28/12  5:14 AM      Result Value Range   Total Protein 5.9 (*) 6.0 - 8.3 g/dL   Albumin 3.2 (*) 3.5 - 5.2 g/dL   AST 70 (*) 0 - 37 U/L   ALT 162 (*) 0 - 35 U/L   Alkaline Phosphatase 87  39 - 117 U/L   Total Bilirubin 0.5  0.3 - 1.2 mg/dL   Bilirubin, Direct 0.1  0.0 - 0.3  mg/dL   Indirect Bilirubin 0.4  0.3 - 0.9 mg/dL  BASIC METABOLIC PANEL   Collection Time    12/28/12  5:14 AM      Result Value Range   Sodium 143  135 - 145 mEq/L   Potassium 3.8  3.5 - 5.1 mEq/L   Chloride 113 (*) 96 - 112 mEq/L   CO2 22  19 - 32 mEq/L   Glucose, Bld 106 (*) 70 - 99 mg/dL   BUN 11  6 - 23 mg/dL   Creatinine, Ser 6.21  0.50 - 1.10 mg/dL   Calcium 30.8  8.4 - 65.7 mg/dL   GFR calc non Af Amer 85 (*) >90  mL/min   GFR calc Af Amer >90  >90 mL/min  HEPATIC FUNCTION PANEL   Collection Time    12/29/12  5:59 AM      Result Value Range   Total Protein 6.2  6.0 - 8.3 g/dL   Albumin 3.3 (*) 3.5 - 5.2 g/dL   AST 31  0 - 37 U/L   ALT 112 (*) 0 - 35 U/L   Alkaline Phosphatase 86  39 - 117 U/L   Total Bilirubin 0.5  0.3 - 1.2 mg/dL   Bilirubin, Direct 0.1  0.0 - 0.3 mg/dL   Indirect Bilirubin 0.4  0.3 - 0.9 mg/dL  HEPATIC FUNCTION PANEL   Collection Time    12/30/12  5:45 AM      Result Value Range   Total Protein 6.2  6.0 - 8.3 g/dL   Albumin 3.4 (*) 3.5 - 5.2 g/dL   AST 22  0 - 37 U/L   ALT 82 (*) 0 - 35 U/L   Alkaline Phosphatase 84  39 - 117 U/L   Total Bilirubin 0.5  0.3 - 1.2 mg/dL   Bilirubin, Direct 0.1  0.0 - 0.3 mg/dL   Indirect Bilirubin 0.4  0.3 - 0.9 mg/dL  PROTIME-INR   Collection Time    12/30/12  5:45 AM      Result Value Range   Prothrombin Time 13.9  11.6 - 15.2 seconds   INR 1.09  0.00 - 1.49  HEPATIC FUNCTION PANEL   Collection Time    12/31/12  8:36 AM      Result Value Range   Total Protein 6.8  6.0 - 8.3 g/dL   Albumin 3.7  3.5 - 5.2 g/dL   AST 18  0 - 37 U/L   ALT 65 (*) 0 - 35 U/L   Alkaline Phosphatase 89  39 - 117 U/L   Total Bilirubin 0.6  0.3 - 1.2 mg/dL   Bilirubin, Direct 0.1  0.0 - 0.3 mg/dL   Indirect Bilirubin 0.5  0.3 - 0.9 mg/dL   Her cardiologist draws her labs and reportedly there are no problems.    Assessment Axis I Maj. depressive disorder Axis II deferred Axis III see medical history Axis IV are to moderate.  Plan: I took her vitals.  I reviewed CC, tobacco/med/surg Hx, meds effects/ side effects, problem list, therapies and responses as well as current situation/symptoms discussed options. Continue Neurontin and Ambien for sleep. I will add Lexapro 10 mg every morning. She'll return in 6 weeks after she's had her gallbladder surgery See orders and pt instructions for more details.  MEDICATIONS this encounter: Meds ordered this  encounter  Medications  . gabapentin (NEURONTIN) 300 MG capsule    Sig: Take 1 capsule (300 mg total) by mouth at bedtime.  Dispense:  30 capsule    Refill:  2  . gabapentin (NEURONTIN) 100 MG capsule    Sig: Take 1 capsule (100 mg total) by mouth 2 (two) times daily.    Dispense:  60 capsule    Refill:  2  . zolpidem (AMBIEN) 10 MG tablet    Sig: Take 1 tablet (10 mg total) by mouth at bedtime as needed for sleep.    Dispense:  30 tablet    Refill:  3  . escitalopram (LEXAPRO) 10 MG tablet    Sig: Take 1 tablet (10 mg total) by mouth daily.    Dispense:  30 tablet    Refill:  2    Medical Decision Making Problem Points:  Established problem, stable/improving (1), New problem, with additional work-up planned (4), Review of last therapy session (1) and Review of psycho-social stressors (1) Data Points:  Review or order clinical lab tests (1) Review of new medications or change in dosage (2)  I certify that outpatient services furnished can reasonably be expected to improve the patient's condition.   Diannia Ruder, MD

## 2013-04-01 ENCOUNTER — Encounter (HOSPITAL_COMMUNITY): Payer: Self-pay | Admitting: Pharmacy Technician

## 2013-04-01 NOTE — H&P (Signed)
  NTS SOAP Note  Vital Signs:  Vitals as of: 04/01/2013: Systolic 169: Diastolic 80: Heart Rate 64: Temp 97.45F: Height 76ft 2.5in: Weight 205Lbs 0 Ounces: BMI 36.9  BMI : 36.9 kg/m2  Subjective: This 67 Years old Female presents for of    ABDOMINAL PAIN : ,Was in hospital for abdominal pain.  MRCP showed no choledocholithiasis, did have gallstones.  Now presents for cholecystectomy.  Patient recently fell and injured her right elbow, which is in a sling.  Being seen by Ortho.  Currently asymptomatic.  Review of Symptoms:  Constitutional:unremarkable   Head:unremarkable    Eyes:unremarkable   Nose/Mouth/Throat:unremarkable Cardiovascular:  unremarkable   Respiratory:unremarkable   Gastrointestinal:  unremarkable   Genitourinary:unremarkable     Skin:unremarkable Hematolgic/Lymphatic:unremarkable     Allergic/Immunologic:unremarkable     Past Medical History:    Reviewed   Past Medical History  Surgical History: left breast biopsy 2012, knee replacement, [facility]-BSO, lumg biopsy Medical Problems:  High Blood pressure, High cholesterol Allergies: codeine, sulfur, PCN Medications: lovastatin, zyrtec, atenolol, ambien, neurotin, lotensin   Social History:Reviewed  Social History  Preferred Language: English Race:  Black or African American Ethnicity: Not Hispanic / Latino Age: 87 Years 11 Months Marital Status:  M Alcohol:  No Recreational drug(s):  No   Smoking Status: Never smoker reviewed on 01/14/2013 Functional Status reviewed on mm/dd/yyyy ------------------------------------------------ Bathing: Normal Cooking: Normal Dressing: Normal Driving: Normal Eating: Normal Managing Meds: Normal Oral Care: Normal Shopping: Normal Toileting: Normal Transferring: Normal Walking: Normal Cognitive Status reviewed on mm/dd/yyyy ------------------------------------------------ Attention: Normal Decision Making: Normal Language:  Normal Memory: Normal Motor: Normal Perception: Normal Problem Solving: Normal Visual and Spatial: Normal   Family History:  Reviewed  Family Health History Family History is Unknown    Objective Information: General:  Well appearing, well nourished in no distress. Heart:  RRR, no murmur Lungs:    CTA bilaterally, no wheezes, rhonchi, rales.  Breathing unlabored. Abdomen:Soft, NT/ND, no HSM, no masses.   Right arm in sling  Assessment:Cholelithaisis, biliary colic  Diagnosis &amp; Procedure Smart Code   Plan:Scheduled for laparoscopic cholecystectomy o 04/09/13.   Patient Education:Alternative treatments to surgery were discussed with patient (and family).  Risks and benefits  of procedure were fully explained to the patient (and family) who gave informed consent. Patient/family questions were addressed.  Follow-up:Pending Surgery

## 2013-04-05 ENCOUNTER — Encounter (HOSPITAL_COMMUNITY): Payer: Self-pay

## 2013-04-05 ENCOUNTER — Encounter (HOSPITAL_COMMUNITY)
Admission: RE | Admit: 2013-04-05 | Discharge: 2013-04-05 | Disposition: A | Discharge status: Home / Self Care | Payer: Medicare Other | Source: Ambulatory Visit | Attending: General Surgery | Admitting: General Surgery

## 2013-04-05 ENCOUNTER — Other Ambulatory Visit: Payer: Self-pay

## 2013-04-05 DIAGNOSIS — Z01810 Encounter for preprocedural cardiovascular examination: Secondary | ICD-10-CM | POA: Insufficient documentation

## 2013-04-05 DIAGNOSIS — Z01812 Encounter for preprocedural laboratory examination: Secondary | ICD-10-CM | POA: Insufficient documentation

## 2013-04-05 DIAGNOSIS — Z01818 Encounter for other preprocedural examination: Secondary | ICD-10-CM | POA: Insufficient documentation

## 2013-04-05 HISTORY — DX: Sleep apnea, unspecified: G47.30

## 2013-04-05 LAB — HEPATIC FUNCTION PANEL
ALT: 12 U/L (ref 0–35)
AST: 14 U/L (ref 0–37)
Albumin: 3.8 g/dL (ref 3.5–5.2)
Alkaline Phosphatase: 70 U/L (ref 39–117)
Bilirubin, Direct: 0.1 mg/dL (ref 0.0–0.3)
Indirect Bilirubin: 0.4 mg/dL (ref 0.3–0.9)
Total Bilirubin: 0.5 mg/dL (ref 0.3–1.2)
Total Protein: 6.4 g/dL (ref 6.0–8.3)

## 2013-04-05 LAB — BASIC METABOLIC PANEL
BUN: 14 mg/dL (ref 6–23)
CO2: 28 mEq/L (ref 19–32)
Calcium: 10.7 mg/dL — ABNORMAL HIGH (ref 8.4–10.5)
Chloride: 105 mEq/L (ref 96–112)
Creatinine, Ser: 0.95 mg/dL (ref 0.50–1.10)
GFR calc Af Amer: 70 mL/min — ABNORMAL LOW (ref >90)
GFR calc non Af Amer: 61 mL/min — ABNORMAL LOW (ref >90)
Glucose, Bld: 93 mg/dL (ref 70–99)
Potassium: 4.4 mEq/L (ref 3.5–5.1)
Sodium: 143 mEq/L (ref 135–145)

## 2013-04-05 LAB — CBC WITH DIFFERENTIAL/PLATELET
Basophils Absolute: 0 10*3/uL (ref 0.0–0.1)
Basophils Relative: 0 % (ref 0–1)
Eosinophils Absolute: 0.1 10*3/uL (ref 0.0–0.7)
Eosinophils Relative: 2 % (ref 0–5)
HCT: 40.9 % (ref 36.0–46.0)
Hemoglobin: 13.2 g/dL (ref 12.0–15.0)
Lymphocytes Relative: 32 % (ref 12–46)
Lymphs Abs: 1.5 10*3/uL (ref 0.7–4.0)
MCH: 26.8 pg (ref 26.0–34.0)
MCHC: 32.3 g/dL (ref 30.0–36.0)
MCV: 83.1 fL (ref 78.0–100.0)
Monocytes Absolute: 0.3 10*3/uL (ref 0.1–1.0)
Monocytes Relative: 7 % (ref 3–12)
Neutro Abs: 2.8 10*3/uL (ref 1.7–7.7)
Neutrophils Relative %: 59 % (ref 43–77)
Platelets: 175 10*3/uL (ref 150–400)
RBC: 4.92 MIL/uL (ref 3.87–5.11)
RDW: 15.8 % — ABNORMAL HIGH (ref 11.5–15.5)
WBC: 4.8 10*3/uL (ref 4.0–10.5)

## 2013-04-05 MED ORDER — CHLORHEXIDINE GLUCONATE 4 % EX LIQD: 1.0000 "application " | Freq: Once | CUTANEOUS | Status: DC

## 2013-04-05 NOTE — Progress Notes (Signed)
04/05/13 1308  OBSTRUCTIVE SLEEP APNEA  Have you ever been diagnosed with sleep apnea through a sleep study? Yes  If yes, do you have and use a CPAP or BPAP machine every night? 0 (Patient said Dr. Gerilyn Pilgrim told her she had "a little bit" of sleep apnea, but not bad enough to treat)  Do you snore loudly (loud enough to be heard through closed doors)?  1  Do you often feel tired, fatigued, or sleepy during the daytime? 1  Has anyone observed you stop breathing during your sleep? 0  Do you have, or are you being treated for high blood pressure? 1  BMI more than 35 kg/m2? 1  Age over 71 years old? 1  Neck circumference greater than 40 cm/18 inches? 0  Gender: 0  Obstructive Sleep Apnea Score 5  Score 4 or greater  Results sent to PCP

## 2013-04-05 NOTE — Patient Instructions (Signed)
[name]  04/05/2013   Your procedure is scheduled on:  04/09/13  Report to [facility] at 08:25 AM.  Call this number if you have problems the morning of surgery: [phone]   Remember:   Do not eat food or drink liquids after midnight.   Take these medicines the morning of surgery with A SIP OF WATER: Amlodipine, Atenolol, Lexapro, Aciphex and Gabapentin. You make take your Hydrocodone, Zofran and Tramadol if needed.   Do not wear jewelry, make-up or nail polish.  Do not wear lotions, powders, or perfumes.   Do not shave 48 hours prior to surgery. Men may shave face and neck.  Do not bring valuables to the hospital.  [facility] is not responsible for any belongings or valuables.               Contacts, dentures or bridgework may not be worn into surgery.  Leave suitcase in the car. After surgery it may be brought to your room.  For patients admitted to the hospital, discharge time is determined by your treatment team.               Patients discharged the day of surgery will not be allowed to drive home.   Special Instructions: Shower using CHG 2 nights before surgery and the night before surgery.  If you shower the day of surgery use CHG.  Use special wash - you have one bottle of CHG for all showers.  You should use approximately 1/3 of the bottle for each shower.   Please read over the following fact sheets that you were given: Pain Booklet, Surgical Site Infection Prevention, Anesthesia Post-op Instructions and Care and Recovery After Surgery    Laparoscopic Cholecystectomy Laparoscopic cholecystectomy is surgery to remove the gallbladder. The gallbladder is located slightly to the right of center in the abdomen, behind the liver. It is a concentrating and storage sac for the bile produced in the liver. Bile aids in the digestion and absorption of fats. Gallbladder disease (cholecystitis) is an inflammation of your gallbladder. This condition is usually caused by a  buildup of gallstones (cholelithiasis) in your gallbladder. Gallstones can block the flow of bile, resulting in inflammation and pain. In severe cases, emergency surgery may be required. When emergency surgery is not required, you will have time to prepare for the procedure. Laparoscopic surgery is an alternative to open surgery. Laparoscopic surgery usually has a shorter recovery time. Your common bile duct may also need to be examined and explored. Your caregiver will discuss this with you if he or she feels this should be done. If stones are found in the common bile duct, they may be removed. LET YOUR CAREGIVER KNOW ABOUT:  Allergies to food or medicine.  Medicines taken, including vitamins, herbs, eyedrops, over-the-counter medicines, and creams.  Use of steroids (by mouth or creams).  Previous problems with anesthetics or numbing medicines.  History of bleeding problems or blood clots.  Previous surgery.  Other health problems, including diabetes and kidney problems.  Possibility of pregnancy, if this applies. RISKS AND COMPLICATIONS All surgery is associated with risks. Some problems that may occur following this procedure include:  Infection.  Damage to the common bile duct, nerves, arteries, veins, or other internal organs such as the stomach or intestines.  Bleeding.  A stone may remain in the common bile duct. BEFORE THE PROCEDURE  Do not take aspirin for 3 days prior to surgery or blood thinners for 1 week prior to  surgery.  Do not eat or drink anything after midnight the night before surgery.  Let your caregiver know if you develop a cold or other infectious problem prior to surgery.  You should be present 60 minutes before the procedure or as directed. PROCEDURE  You will be given medicine that makes you sleep (general anesthetic). When you are asleep, your surgeon will make several small cuts (incisions) in your abdomen. One of these incisions is used to insert a  small, lighted scope (laparoscope) into the abdomen. The laparoscope helps the surgeon see into your abdomen. Carbon dioxide gas will be pumped into your abdomen. The gas allows more room for the surgeon to perform your surgery. Other operating instruments are inserted through the other incisions. Laparoscopic procedures may not be appropriate when:  There is major scarring from previous surgery.  The gallbladder is extremely inflamed.  There are bleeding disorders or unexpected cirrhosis of the liver.  A pregnancy is near term.  Other conditions make the laparoscopic procedure impossible. If your surgeon feels it is not safe to continue with a laparoscopic procedure, he or she will perform an open abdominal procedure. In this case, the surgeon will make an incision to open the abdomen. This gives the surgeon a larger view and field to work within. This may allow the surgeon to perform procedures that sometimes cannot be performed with a laparoscope alone. Open surgery has a longer recovery time. AFTER THE PROCEDURE  You will be taken to the recovery area where a nurse will watch and check your progress.  You may be allowed to go home the same day.  Do not resume physical activities until directed by your caregiver.  You may resume a normal diet and activities as directed. Document Released: 06/10/2005 Document Revised: 09/02/2011 Document Reviewed: 11/23/2010 [facility] Patient Information 2014 [place], [place].    PATIENT INSTRUCTIONS POST-ANESTHESIA  IMMEDIATELY FOLLOWING SURGERY:  Do not drive or operate machinery for the first twenty four hours after surgery.  Do not make any important decisions for twenty four hours after surgery or while taking narcotic pain medications or sedatives.  If you develop intractable nausea and vomiting or a severe headache please notify your doctor immediately.  FOLLOW-UP:  Please make an appointment with your surgeon as instructed. You do not need to  follow up with anesthesia unless specifically instructed to do so.  WOUND CARE INSTRUCTIONS (if applicable):  Keep a dry clean dressing on the anesthesia/puncture wound site if there is drainage.  Once the wound has quit draining you may leave it open to air.  Generally you should leave the bandage intact for twenty four hours unless there is drainage.  If the epidural site drains for more than 36-48 hours please call the anesthesia department.  QUESTIONS?:  Please feel free to call your physician or the hospital operator if you have any questions, and they will be happy to assist you.

## 2013-04-09 ENCOUNTER — Encounter (HOSPITAL_COMMUNITY): Payer: Medicare Other | Admitting: Anesthesiology

## 2013-04-09 ENCOUNTER — Encounter (HOSPITAL_COMMUNITY)
Admission: RE | Disposition: A | Discharge status: Home / Self Care | Payer: Self-pay | Source: Ambulatory Visit | Attending: General Surgery

## 2013-04-09 ENCOUNTER — Encounter (HOSPITAL_COMMUNITY): Payer: Self-pay | Admitting: *Deleted

## 2013-04-09 ENCOUNTER — Ambulatory Visit (HOSPITAL_COMMUNITY)
Admission: RE | Admit: 2013-04-09 | Discharge: 2013-04-09 | Disposition: A | Discharge status: Home / Self Care | Payer: Medicare Other | Source: Ambulatory Visit | Attending: General Surgery | Admitting: General Surgery

## 2013-04-09 ENCOUNTER — Ambulatory Visit (HOSPITAL_COMMUNITY): Payer: Medicare Other | Admitting: Anesthesiology

## 2013-04-09 DIAGNOSIS — I10 Essential (primary) hypertension: Secondary | ICD-10-CM | POA: Insufficient documentation

## 2013-04-09 DIAGNOSIS — K8010 Calculus of gallbladder with chronic cholecystitis without obstruction: Secondary | ICD-10-CM | POA: Insufficient documentation

## 2013-04-09 DIAGNOSIS — S42401D Unspecified fracture of lower end of right humerus, subsequent encounter for fracture with routine healing: Secondary | ICD-10-CM

## 2013-04-09 HISTORY — PX: CHOLECYSTECTOMY: SHX55

## 2013-04-09 SURGERY — LAPAROSCOPIC CHOLECYSTECTOMY
Anesthesia: General | Site: Abdomen | Wound class: Contaminated

## 2013-04-09 MED ORDER — ONDANSETRON HCL 4 MG/2ML IJ SOLN
4.0000 mg | Freq: Once | INTRAMUSCULAR | Status: AC
  Administered 2013-04-09: 4 mg via INTRAVENOUS

## 2013-04-09 MED ORDER — PROPOFOL 10 MG/ML IV BOLUS
INTRAVENOUS | Status: AC
  Filled 2013-04-09: qty 20

## 2013-04-09 MED ORDER — CLINDAMYCIN PHOSPHATE 900 MG/50ML IV SOLN
INTRAVENOUS | Status: AC
  Filled 2013-04-09: qty 50

## 2013-04-09 MED ORDER — LIDOCAINE HCL (CARDIAC) 10 MG/ML IV SOLN
INTRAVENOUS | Status: DC | PRN
  Administered 2013-04-09: 20 mg via INTRAVENOUS

## 2013-04-09 MED ORDER — GLYCOPYRROLATE 0.2 MG/ML IJ SOLN
INTRAMUSCULAR | Status: AC
  Filled 2013-04-09: qty 2

## 2013-04-09 MED ORDER — HEMOSTATIC AGENTS (NO CHARGE) OPTIME
TOPICAL | Status: DC | PRN
  Administered 2013-04-09: 1 via TOPICAL

## 2013-04-09 MED ORDER — LACTATED RINGERS IV SOLN
INTRAVENOUS | Status: DC
  Administered 2013-04-09 (×2): 1000 mL via INTRAVENOUS

## 2013-04-09 MED ORDER — GLYCOPYRROLATE 0.2 MG/ML IJ SOLN
INTRAMUSCULAR | Status: AC
  Filled 2013-04-09: qty 1

## 2013-04-09 MED ORDER — SODIUM CHLORIDE 0.9 % IR SOLN
Status: DC | PRN
  Administered 2013-04-09: 500 mL

## 2013-04-09 MED ORDER — ROCURONIUM BROMIDE 50 MG/5ML IV SOLN
INTRAVENOUS | Status: AC
  Filled 2013-04-09: qty 1

## 2013-04-09 MED ORDER — MIDAZOLAM HCL 2 MG/2ML IJ SOLN
INTRAMUSCULAR | Status: AC
  Filled 2013-04-09: qty 2

## 2013-04-09 MED ORDER — PROPOFOL 10 MG/ML IV BOLUS
INTRAVENOUS | Status: DC | PRN
  Administered 2013-04-09: 30 mg via INTRAVENOUS
  Administered 2013-04-09: 120 mg via INTRAVENOUS

## 2013-04-09 MED ORDER — LIDOCAINE HCL (PF) 1 % IJ SOLN
INTRAMUSCULAR | Status: AC
  Filled 2013-04-09: qty 5

## 2013-04-09 MED ORDER — FENTANYL CITRATE 0.05 MG/ML IJ SOLN
25.0000 ug | INTRAMUSCULAR | Status: DC | PRN
  Administered 2013-04-09 (×3): 50 ug via INTRAVENOUS
  Filled 2013-04-09 (×2): qty 2

## 2013-04-09 MED ORDER — ONDANSETRON HCL 4 MG/2ML IJ SOLN
INTRAMUSCULAR | Status: AC
  Filled 2013-04-09: qty 2

## 2013-04-09 MED ORDER — FENTANYL CITRATE 0.05 MG/ML IJ SOLN
INTRAMUSCULAR | Status: DC | PRN
  Administered 2013-04-09 (×2): 50 ug via INTRAVENOUS

## 2013-04-09 MED ORDER — HYDROCODONE-ACETAMINOPHEN 7.5-325 MG PO TABS: 1.0000 | ORAL_TABLET | Freq: Four times a day (QID) | ORAL | Status: DC | PRN

## 2013-04-09 MED ORDER — GLYCOPYRROLATE 0.2 MG/ML IJ SOLN
0.2000 mg | Freq: Once | INTRAMUSCULAR | Status: AC
  Administered 2013-04-09: 0.2 mg via INTRAVENOUS

## 2013-04-09 MED ORDER — FENTANYL CITRATE 0.05 MG/ML IJ SOLN
INTRAMUSCULAR | Status: AC
  Filled 2013-04-09: qty 5

## 2013-04-09 MED ORDER — ROCURONIUM BROMIDE 100 MG/10ML IV SOLN
INTRAVENOUS | Status: DC | PRN
  Administered 2013-04-09: 5 mg via INTRAVENOUS
  Administered 2013-04-09: 25 mg via INTRAVENOUS

## 2013-04-09 MED ORDER — BUPIVACAINE HCL (PF) 0.5 % IJ SOLN
INTRAMUSCULAR | Status: AC
  Filled 2013-04-09: qty 30

## 2013-04-09 MED ORDER — MIDAZOLAM HCL 2 MG/2ML IJ SOLN
1.0000 mg | INTRAMUSCULAR | Status: DC | PRN
  Administered 2013-04-09 (×2): 2 mg via INTRAVENOUS

## 2013-04-09 MED ORDER — GLYCOPYRROLATE 0.2 MG/ML IJ SOLN
INTRAMUSCULAR | Status: DC | PRN
  Administered 2013-04-09: 0.2 mg via INTRAVENOUS
  Administered 2013-04-09: 0.4 mg via INTRAVENOUS

## 2013-04-09 MED ORDER — CLINDAMYCIN PHOSPHATE 900 MG/50ML IV SOLN
900.0000 mg | Freq: Once | INTRAVENOUS | Status: AC
  Administered 2013-04-09: 900 mg via INTRAVENOUS

## 2013-04-09 MED ORDER — KETOROLAC TROMETHAMINE 30 MG/ML IJ SOLN
30.0000 mg | Freq: Once | INTRAMUSCULAR | Status: AC
  Administered 2013-04-09: 30 mg via INTRAVENOUS
  Filled 2013-04-09: qty 1

## 2013-04-09 MED ORDER — NEOSTIGMINE METHYLSULFATE 1 MG/ML IJ SOLN
INTRAMUSCULAR | Status: AC
  Filled 2013-04-09: qty 1

## 2013-04-09 MED ORDER — ONDANSETRON HCL 4 MG/2ML IJ SOLN
4.0000 mg | Freq: Once | INTRAMUSCULAR | Status: AC | PRN
  Administered 2013-04-09: 4 mg via INTRAVENOUS
  Filled 2013-04-09: qty 2

## 2013-04-09 MED ORDER — NEOSTIGMINE METHYLSULFATE 1 MG/ML IJ SOLN
INTRAMUSCULAR | Status: DC | PRN
  Administered 2013-04-09: 2 mg via INTRAVENOUS
  Administered 2013-04-09: 1 mg via INTRAVENOUS

## 2013-04-09 MED ORDER — BUPIVACAINE HCL (PF) 0.5 % IJ SOLN
INTRAMUSCULAR | Status: DC | PRN
  Administered 2013-04-09: 10 mL

## 2013-04-09 SURGICAL SUPPLY — 44 items
APPLIER CLIP LAPSCP 10X32 DD (CLIP) ×2 IMPLANT
BAG HAMPER (MISCELLANEOUS) ×2 IMPLANT
BAG SPEC RTRVL LRG 6X4 10 (ENDOMECHANICALS) ×1
CLOTH BEACON ORANGE TIMEOUT ST (SAFETY) ×2 IMPLANT
COVER LIGHT HANDLE STERIS (MISCELLANEOUS) ×4 IMPLANT
DECANTER SPIKE VIAL GLASS SM (MISCELLANEOUS) ×2 IMPLANT
DURAPREP 26ML APPLICATOR (WOUND CARE) ×2 IMPLANT
ELECT REM PT RETURN 9FT ADLT (ELECTROSURGICAL) ×2
ELECTRODE REM PT RTRN 9FT ADLT (ELECTROSURGICAL) ×1 IMPLANT
FILTER SMOKE EVAC LAPAROSHD (FILTER) ×2 IMPLANT
FORMALIN 10 PREFIL 120ML (MISCELLANEOUS) ×2 IMPLANT
GLOVE BIO SURGEON STRL SZ7.5 (GLOVE) IMPLANT
GLOVE INDICATOR 7.0 STRL GRN (GLOVE) ×2 IMPLANT
GLOVE INDICATOR 7.5 STRL GRN (GLOVE) ×2 IMPLANT
GLOVE SKINSENSE NS SZ6.5 (GLOVE) ×1
GLOVE SKINSENSE NS SZ7.0 (GLOVE) ×1
GLOVE SKINSENSE NS SZ7.5 (GLOVE) ×1
GLOVE SKINSENSE STRL SZ6.5 (GLOVE) ×1 IMPLANT
GLOVE SKINSENSE STRL SZ7.0 (GLOVE) ×1 IMPLANT
GLOVE SKINSENSE STRL SZ7.5 (GLOVE) ×1 IMPLANT
GOWN STRL REIN XL XLG (GOWN DISPOSABLE) ×6 IMPLANT
HEMOSTAT SNOW SURGICEL 2X4 (HEMOSTASIS) ×2 IMPLANT
INST SET LAPROSCOPIC AP (KITS) ×2 IMPLANT
IV NS IRRIG 3000ML ARTHROMATIC (IV SOLUTION) IMPLANT
KIT ROOM TURNOVER APOR (KITS) ×2 IMPLANT
MANIFOLD NEPTUNE II (INSTRUMENTS) ×2 IMPLANT
NEEDLE INSUFFLATION 14GA 120MM (NEEDLE) ×2 IMPLANT
NS IRRIG 1000ML POUR BTL (IV SOLUTION) ×2 IMPLANT
PACK LAP CHOLE LZT030E (CUSTOM PROCEDURE TRAY) ×2 IMPLANT
PAD ARMBOARD 7.5X6 YLW CONV (MISCELLANEOUS) ×2 IMPLANT
POUCH SPECIMEN RETRIEVAL 10MM (ENDOMECHANICALS) ×2 IMPLANT
SET BASIN LINEN APH (SET/KITS/TRAYS/PACK) ×2 IMPLANT
SET TUBE IRRIG SUCTION NO TIP (IRRIGATION / IRRIGATOR) IMPLANT
SLEEVE ENDOPATH XCEL 5M (ENDOMECHANICALS) ×2 IMPLANT
SPONGE GAUZE 2X2 8PLY STRL LF (GAUZE/BANDAGES/DRESSINGS) ×8 IMPLANT
STAPLER VISISTAT (STAPLE) ×2 IMPLANT
SUT VICRYL 0 UR6 27IN ABS (SUTURE) ×2 IMPLANT
TAPE CLOTH SURG 4X10 WHT LF (GAUZE/BANDAGES/DRESSINGS) ×2 IMPLANT
TROCAR ENDO BLADELESS 11MM (ENDOMECHANICALS) ×2 IMPLANT
TROCAR XCEL NON-BLD 5MMX100MML (ENDOMECHANICALS) ×2 IMPLANT
TROCAR XCEL UNIV SLVE 11M 100M (ENDOMECHANICALS) ×2 IMPLANT
TUBING INSUFFLATION (TUBING) ×2 IMPLANT
WARMER LAPAROSCOPE (MISCELLANEOUS) ×2 IMPLANT
YANKAUER SUCT 12FT TUBE ARGYLE (SUCTIONS) ×2 IMPLANT

## 2013-04-09 NOTE — Anesthesia Preprocedure Evaluation (Signed)
Anesthesia Evaluation  Patient identified by MRN, date of birth, ID band Patient awake    Reviewed: Allergy & Precautions, H&P , NPO status , Patient's Chart, lab work & pertinent test results, reviewed documented beta blocker date and time   History of Anesthesia Complications Negative for: history of anesthetic complications  Airway Mallampati: I TM Distance: >3 FB Neck ROM: Full    Dental  (+) Edentulous Upper and Partial Lower   Pulmonary asthma , sleep apnea ,  breath sounds clear to auscultation        Cardiovascular hypertension, Pt. on medications and Pt. on home beta blockers Rhythm:Regular Rate:Normal     Neuro/Psych  Headaches, PSYCHIATRIC DISORDERS Anxiety Depression    GI/Hepatic hiatal hernia, GERD-  Medicated,  Endo/Other    Renal/GU      Musculoskeletal   Abdominal   Peds  Hematology   Anesthesia Other Findings   Reproductive/Obstetrics                           Anesthesia Physical Anesthesia Plan  ASA: III  Anesthesia Plan: General   Post-op Pain Management:    Induction: Intravenous, Rapid sequence and Cricoid pressure planned  Airway Management Planned: Oral ETT  Additional Equipment:   Intra-op Plan:   Post-operative Plan: Extubation in OR  Informed Consent: I have reviewed the patients History and Physical, chart, labs and discussed the procedure including the risks, benefits and alternatives for the proposed anesthesia with the patient or authorized representative who has indicated his/her understanding and acceptance.     Plan Discussed with:   Anesthesia Plan Comments:         Anesthesia Quick Evaluation

## 2013-04-09 NOTE — Op Note (Signed)
Patient:  [name]  DOB:  25-Jul-1945  MRN:  [id number]   Preop Diagnosis:  Biliary colic, cholelithiasis  Postop Diagnosis:  Same  Procedure:  Laparoscopic cholecystectomy  Surgeon:  Franky Macho, M.D.  Anes:  General endotracheal  Indications:  Patient is a 67 year old black female presents with biliary colic secondary to cholelithiasis. The risks and benefits of the procedure including bleeding, infection, hepatobiliary injury, and the possibility of an open procedure were fully explained to the patient, who gave informed consent.  Procedure note:  The patient was placed in the supine position. After induction of general endotracheal anesthesia, the abdomen was prepped and draped using usual sterile technique with DuraPrep. Surgical site confirmation was performed.  A supraumbilical incision was made down to the fascia. A Veress needle was introduced into the abdominal cavity and confirmation of placement was done using the saline drop test. The abdomen was then insufflated to 16 mm mercury pressure. An 11 mm trocar was introduced into the abdominal cavity under direct visualization without difficulty. The patient was placed in reverse Trendelenburg position and additional 11 mm trocar was placed the epigastric region and 5 mm trochars were placed the right upper quadrant and right flank regions. The liver was inspected and noted within normal limits. The gallbladder was retracted in a dynamic fashion in order to expose the triangle of Calot. The cystic duct was first identified. Its juncture to the infundibulum was fully identified. Endoclips were placed proximally and distally on the cystic duct, and the cystic duct was divided. This was likewise done to the cystic artery. The gallbladder was then freed away from the gallbladder fossa using Bovie electrocautery. The gallbladder was delivered through the epigastric trocar site using an Endo Catch bag. The gallbladder fossa was inspected  and no abnormal bleeding or bile leakage was noted. Surgicel is placed the gallbladder fossa. All fluid and air were then evacuated from the abdominal cavity prior to removal of the trochars.  All wounds were irrigated with normal saline. All wounds were checked with 0.5% Sensorcaine. The supraumbilical fascia was reapproximated using an 0 Vicryl interrupted suture. All skin incisions were closed using staples. Betadine ointment and dry sterile dressings were applied.  All tape and needle counts were correct at the end of the procedure. Patient was extubated in the operating room and transferred to PACU in stable condition.  Complications:  None  EBL:  Minimal  Specimen:  Gallbladder

## 2013-04-09 NOTE — Anesthesia Procedure Notes (Signed)

## 2013-04-09 NOTE — Transfer of Care (Signed)
Immediate Anesthesia Transfer of Care Note  Patient: [name]  Procedure(s) Performed: Procedure(s) (LRB): LAPAROSCOPIC CHOLECYSTECTOMY (N/A)  Patient Location: PACU  Anesthesia Type: General  Level of Consciousness: awake  Airway & Oxygen Therapy: Patient Spontanous Breathing and non-rebreather face mask  Post-op Assessment: Report given to PACU RN, Post -op Vital signs reviewed and stable and Patient moving all extremities  Post vital signs: Reviewed and stable  Complications: No apparent anesthesia complications

## 2013-04-09 NOTE — Anesthesia Postprocedure Evaluation (Signed)
Anesthesia Post Note  Patient: [name]  Procedure(s) Performed: Procedure(s) (LRB): LAPAROSCOPIC CHOLECYSTECTOMY (N/A)  Anesthesia type: General  Patient location: PACU  Post pain: Pain level controlled  Post assessment: Post-op Vital signs reviewed, Patient's Cardiovascular Status Stable, Respiratory Function Stable, Patent Airway, No signs of Nausea or vomiting and Pain level controlled  Last Vitals:  Filed Vitals:   04/09/13 0952  BP: 156/86  Pulse: 83  Temp: 36.6 C  Resp: 18    Post vital signs: Reviewed and stable  Level of consciousness: awake and alert   Complications: No apparent anesthesia complications

## 2013-04-09 NOTE — Interval H&P Note (Signed)
History and Physical Interval Note:  04/09/2013 8:51 AM  [name]  has presented today for surgery, with the diagnosis of cholelithiasis  The various methods of treatment have been discussed with the patient and family. After consideration of risks, benefits and other options for treatment, the patient has consented to  Procedure(s): LAPAROSCOPIC CHOLECYSTECTOMY (N/A) as a surgical intervention .  The patient's history has been reviewed, patient examined, no change in status, stable for surgery.  I have reviewed the patient's chart and labs.  Questions were answered to the patient's satisfaction.     Franky Macho A

## 2013-04-12 ENCOUNTER — Encounter (HOSPITAL_COMMUNITY): Payer: Self-pay | Admitting: General Surgery

## 2013-04-21 ENCOUNTER — Encounter (HOSPITAL_COMMUNITY): Payer: Self-pay | Admitting: Psychiatry

## 2013-04-21 ENCOUNTER — Ambulatory Visit (INDEPENDENT_AMBULATORY_CARE_PROVIDER_SITE_OTHER): Payer: Medicare Other | Admitting: Psychiatry

## 2013-04-21 VITALS — BP 130/88 | Ht 62.0 in | Wt 194.0 lb

## 2013-04-21 DIAGNOSIS — F32A Depression, unspecified: Secondary | ICD-10-CM

## 2013-04-21 DIAGNOSIS — F329 Major depressive disorder, single episode, unspecified: Secondary | ICD-10-CM

## 2013-04-21 MED ORDER — GABAPENTIN 300 MG PO CAPS: 300.0000 mg | ORAL_CAPSULE | Freq: Two times a day (BID) | ORAL | Status: DC

## 2013-04-21 MED ORDER — ESCITALOPRAM OXALATE 20 MG PO TABS: 20.0000 mg | ORAL_TABLET | Freq: Every day | ORAL | Status: DC

## 2013-04-21 NOTE — Progress Notes (Signed)
[name], female   DOB: [birth year], 67 y.o.   MRN: [id number] Patient ID: CECELIA Houston, female   DOB: 20-Aug-1945, 67 y.o.   MRN: [id number] [facility] [id number] Progress Note [name] MRN: [id number] DOB: 07-Oct-1945 Age: 67 y.o.  Date: 04/21/2013 Start Time: 9:45 AM End Time: 10:10 AM  Chief Complaint: Chief Complaint  Patient presents with  . Anxiety  . Depression  . Follow-up    Subjective: "I'm doing a little better."  This patient is a 67 year old black female who lives with her 55 year old daughter who has mild mental retardation. She lives in [place]. She is a retired Lawyer. The patient states that she's been depressed "all my life.". She can't give any specific reasons for the depression but notes that at age 45 she was already taking medication to help with sleep. In her 63s her family doctor diagnosed with schizophrenia although she had no symptoms of paranoia or delusions or hallucinations. She was given medication for this.  Since 1997 she's been coming here with symptoms of depression. She's also had significant problems with fibromyalgia and fatigue. In Neurontin and Ambien have helped. Lately however she's become more depressed and worried. She has had gallbladder surgery and she's concerned about how is going to go. For some reason she's been thinking a lot about death. She doesn't have anyone to stay with her daughter something goes wrong. She's been more negative but is densely not suicidal but her energy has dropped. In general she is a very functional person  The patient returns after 4 weeks. She had her gallbladder surgery 2 weeks ago which went well She's now on Lexapro 10 mg every morning. She's feeling a little bit better. Her energy waxes and wanes. Last couple of days she's been depressed again and thinking about death. She wonders how  her funeral. Could be arranged and how her daughter would handle it. I suggested she talk to  funeral home and get this arranged in advance to give her some peace of mind. We also discussed how she should improve her life while she is still living. She used to be involved at the senior center and also working on [employer]. She's got involved with helping other people she met at the senior center with all her appointments. It's hard for her to say no but she knows she needs to. She agrees to work on this and take more time for herself and her interests.  Current psychiatric medication Neurontin 200 mg twice a day Ambien 10 mg at bedtime Lexapro 10 mg every morning  Vitals: BP 130/88  Ht 5\' 2"  (1.575 m)  Wt 194 lb (87.998 kg)  BMI 35.47 kg/m2  Psychiatric history Patient has a long history of depression.  She has been seeing in this office since May 1997.  Patient denies any history of suicidal attempt however endorse chronic depression and anxiety.  In the past she has taken Prozac but did not like after taking few doses, she also has taken Seroquel, Abilify, Xanax, Pamelor, and amitriptyline.  Patient denies a history of paranoia or delusions however there are times when she has been very depressed and have a lot of negative symptoms.  Alcohol and substance use history Patient denies any history of alcohol or substance use.  Allergies: Allergies  Allergen Reactions  . Elavil [Amitriptyline] Other (See Comments)    Felt really nervous and felt like something was hold her feet or arm and/ or AM  headache on it and back on it and went away off it.   . Abilify [Aripiprazole] Other (See Comments)    Dystonic reaction  . Trazodone And Nefazodone Other (See Comments)    Brought back bad dreams of things in the past  . Codeine Hives, Nausea Only and Other (See Comments)    Feels funny  . Latex Hives  . Penicillins Hives  . Polyethylene Glycol Other (See Comments)    Per allergy test  . Sulfonamide Derivatives Nausea And Vomiting  . Cymbalta [Duloxetine Hcl] Rash  .  Remeron [Mirtazapine] Other (See Comments)    Caused lots of strange, weird, crazy dreams.   Medical History: Past Medical History  Diagnosis Date  . Hypertension   . Hyperlipidemia   . Gastroesophageal reflux disease   . Diverticulosis   . Anxiety and depression   . Adenomatous colon polyp 2006    excised in 2006 & 2010Due surveillance 06/2013  . Chronic back pain   . Thyroid nodule   . Fibromyalgia   . Migraines   . Insomnia   . Chronic constipation   . Asthma   . History of benign esophageal tumor 2010    granular cell esophageal tumor (Dx 06/2008), resected via EMR 2011, due repeat EGD 02/2012  . Chest discomfort   . Hiatal hernia   . Depression   . Anxiety   . Sleep apnea     Stop Bang score of 5. Pt said she was told by Dr. Gerilyn Pilgrim that she had "a little bit" of sleep apena, but not bad enough to treat.  Patient has history of hypertension, hyperlipidemia, GERD, diverticulosis, colonic polyp, chronic back pain, fibromyalgia, chronic constipation, benign tumor of esophagus and chronic headache.  Her primary care physician is Dr. Felecia Shelling, she sees Dr. Harrel Carina for chronic pain, fibromyalgia and headache. Her cardiologist is Dr Mylo Red. Surgical History: Past Surgical History  Procedure Laterality Date  . Tonsillectomy    . Abdominal hysterectomy    . Total knee arthroplasty  2002    Right; previous arthroscopic surgery  . Shoulder arthroscopy      Right; bone spurs removed  . Lung biopsy      negative  . 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 Right oophorectomy      benign disease  . Colonoscopy  06/2008, 06/2011    sigmoid tics, tubular adenoma; 2013: anal canal hemorrhoids  . Eus  08/2010    Dr [facility] with EGD. Retained food. No recurrent esophageal lesion, bx negative.  . Colonoscopy  07/10/2011    Anal canal hemorrhoids likely the cause of hematochezia in the setting of constipation; otherwise normal rectum  ;submucosal  petechiae in left colon of doubtful clinical significance; otherwise, normal colon  . Esophagogastroduodenoscopy  12/11/2011    Procedure: ESOPHAGOGASTRODUODENOSCOPY (EGD);  Surgeon: Corbin Ade, MD;  Location: [facility];  Service: Endoscopy;  Laterality: N/A;  11:00  . Bravo ph study  12/11/2011    Procedure: BRAVO PH STUDY;  Surgeon: Corbin Ade, MD;  Location: [facility];  Service: Endoscopy;  Laterality: N/A;  . Cholecystectomy N/A 04/09/2013    Procedure: LAPAROSCOPIC CHOLECYSTECTOMY;  Surgeon: Dalia Heading, MD;  Location: [facility];  Service: General;  Laterality: N/A;   Family History: family history includes Alcohol abuse in her father; Anxiety disorder in her mother; Colon cancer in her paternal aunt and paternal uncle; Dementia in her maternal uncle; Depression in  her mother; Heart attack in her mother; Stroke in her father. There is no history of ADD / ADHD, Bipolar disorder, Drug abuse, OCD, Paranoid behavior, Schizophrenia, Seizures, Sexual abuse, or Physical abuse. Reviewed and nothing new today again.  Mental status examination Patient is casually dressed and fairly groomed. She is calm cooperative and maintained fair eye contact. She described her mood is better but she still is a bit sad and her affect is mood appropriate. She still has chronic pain  Her speech is coherent but slow.  Her thought processes clear and logical. She admits to being sad but denies suicidal ideation but no plans, no active or passive homicidal thoughts. She denies any auditory or visual hallucination. There no psychotic symptoms present at this time. She's alert and oriented x3. Her insight judgment impulse control is okay  Lab Results:  Results for orders placed during the hospital encounter of 04/05/13 (from the past 8736 hour(s))  HEPATIC FUNCTION PANEL   Collection Time    04/05/13  1:15 PM      Result Value Range   Total Protein 6.4  6.0 - 8.3 g/dL   Albumin 3.8  3.5 - 5.2  g/dL   AST 14  0 - 37 U/L   ALT 12  0 - 35 U/L   Alkaline Phosphatase 70  39 - 117 U/L   Total Bilirubin 0.5  0.3 - 1.2 mg/dL   Bilirubin, Direct 0.1  0.0 - 0.3 mg/dL   Indirect Bilirubin 0.4  0.3 - 0.9 mg/dL  BASIC METABOLIC PANEL   Collection Time    04/05/13  1:15 PM      Result Value Range   Sodium 143  135 - 145 mEq/L   Potassium 4.4  3.5 - 5.1 mEq/L   Chloride 105  96 - 112 mEq/L   CO2 28  19 - 32 mEq/L   Glucose, Bld 93  70 - 99 mg/dL   BUN 14  6 - 23 mg/dL   Creatinine, Ser 4.54  0.50 - 1.10 mg/dL   Calcium 09.8 (*) 8.4 - 10.5 mg/dL   GFR calc non Af Amer 61 (*) >90 mL/min   GFR calc Af Amer 70 (*) >90 mL/min  CBC WITH DIFFERENTIAL   Collection Time    04/05/13  1:15 PM      Result Value Range   WBC 4.8  4.0 - 10.5 K/uL   RBC 4.92  3.87 - 5.11 MIL/uL   Hemoglobin 13.2  12.0 - 15.0 g/dL   HCT 11.9  14.7 - 82.9 %   MCV 83.1  78.0 - 100.0 fL   MCH 26.8  26.0 - 34.0 pg   MCHC 32.3  30.0 - 36.0 g/dL   RDW 56.2 (*) 13.0 - 86.5 %   Platelets 175  150 - 400 K/uL   Neutrophils Relative % 59  43 - 77 %   Neutro Abs 2.8  1.7 - 7.7 K/uL   Lymphocytes Relative 32  12 - 46 %   Lymphs Abs 1.5  0.7 - 4.0 K/uL   Monocytes Relative 7  3 - 12 %   Monocytes Absolute 0.3  0.1 - 1.0 K/uL   Eosinophils Relative 2  0 - 5 %   Eosinophils Absolute 0.1  0.0 - 0.7 K/uL   Basophils Relative 0  0 - 1 %   Basophils Absolute 0.0  0.0 - 0.1 K/uL  Results for orders placed during the hospital encounter of 12/26/12 (from the past 8736  hour(s))  CBC WITH DIFFERENTIAL   Collection Time    12/26/12  6:49 AM      Result Value Range   WBC 4.9  4.0 - 10.5 K/uL   RBC 4.74  3.87 - 5.11 MIL/uL   Hemoglobin 12.6  12.0 - 15.0 g/dL   HCT 16.1  09.6 - 04.5 %   MCV 81.9  78.0 - 100.0 fL   MCH 26.6  26.0 - 34.0 pg   MCHC 32.5  30.0 - 36.0 g/dL   RDW 40.9  81.1 - 91.4 %   Platelets 125 (*) 150 - 400 K/uL   Neutrophils Relative % 91 (*) 43 - 77 %   Neutro Abs 4.4  1.7 - 7.7 K/uL   Lymphocytes  Relative 7 (*) 12 - 46 %   Lymphs Abs 0.3 (*) 0.7 - 4.0 K/uL   Monocytes Relative 1 (*) 3 - 12 %   Monocytes Absolute 0.1  0.1 - 1.0 K/uL   Eosinophils Relative 0  0 - 5 %   Eosinophils Absolute 0.0  0.0 - 0.7 K/uL   Basophils Relative 0  0 - 1 %   Basophils Absolute 0.0  0.0 - 0.1 K/uL  BASIC METABOLIC PANEL   Collection Time    12/26/12  6:49 AM      Result Value Range   Sodium 139  135 - 145 mEq/L   Potassium 3.2 (*) 3.5 - 5.1 mEq/L   Chloride 104  96 - 112 mEq/L   CO2 27  19 - 32 mEq/L   Glucose, Bld 137 (*) 70 - 99 mg/dL   BUN 16  6 - 23 mg/dL   Creatinine, Ser 7.82  0.50 - 1.10 mg/dL   Calcium 95.6 (*) 8.4 - 10.5 mg/dL   GFR calc non Af Amer 66 (*) >90 mL/min   GFR calc Af Amer 77 (*) >90 mL/min  TROPONIN I   Collection Time    12/26/12  6:49 AM      Result Value Range   Troponin I <0.30  <0.30 ng/mL  LIPASE, BLOOD   Collection Time    12/26/12  7:43 AM      Result Value Range   Lipase 47  11 - 59 U/L  HEPATIC FUNCTION PANEL   Collection Time    12/26/12  7:43 AM      Result Value Range   Total Protein 6.5  6.0 - 8.3 g/dL   Albumin 3.8  3.5 - 5.2 g/dL   AST 213 (*) 0 - 37 U/L   ALT 121 (*) 0 - 35 U/L   Alkaline Phosphatase 90  39 - 117 U/L   Total Bilirubin 0.8  0.3 - 1.2 mg/dL   Bilirubin, Direct 0.4 (*) 0.0 - 0.3 mg/dL   Indirect Bilirubin 0.4  0.3 - 0.9 mg/dL  D-DIMER, QUANTITATIVE   Collection Time    12/26/12  7:43 AM      Result Value Range   D-Dimer, Quant 0.31  0.00 - 0.48 ug/mL-FEU  URINE CULTURE   Collection Time    12/26/12  8:41 AM      Result Value Range   Specimen Description URINE, CLEAN CATCH     Special Requests NONE     Culture  Setup Time 12/26/2012 19:29     Colony Count 7,000 COLONIES/ML     Culture INSIGNIFICANT GROWTH     Report Status 12/27/2012 FINAL    URINALYSIS, ROUTINE W REFLEX MICROSCOPIC   Collection Time  12/26/12  8:41 AM      Result Value Range   Color, Urine YELLOW  YELLOW   APPearance CLEAR  CLEAR   Specific  Gravity, Urine >1.030 (*) 1.005 - 1.030   pH 5.5  5.0 - 8.0   Glucose, UA NEGATIVE  NEGATIVE mg/dL   Hgb urine dipstick NEGATIVE  NEGATIVE   Bilirubin Urine NEGATIVE  NEGATIVE   Ketones, ur NEGATIVE  NEGATIVE mg/dL   Protein, ur NEGATIVE  NEGATIVE mg/dL   Urobilinogen, UA 0.2  0.0 - 1.0 mg/dL   Nitrite NEGATIVE  NEGATIVE   Leukocytes, UA TRACE (*) NEGATIVE  URINE MICROSCOPIC-ADD ON   Collection Time    12/26/12  8:41 AM      Result Value Range   Squamous Epithelial / LPF RARE  RARE   WBC, UA 3-6  <3 WBC/hpf   RBC / HPF 0-2  <3 RBC/hpf   Bacteria, UA RARE  RARE   Urine-Other MUCOUS PRESENT    TROPONIN I   Collection Time    12/26/12 12:30 PM      Result Value Range   Troponin I <0.30  <0.30 ng/mL  TROPONIN I   Collection Time    12/26/12  5:25 PM      Result Value Range   Troponin I <0.30  <0.30 ng/mL  COMPREHENSIVE METABOLIC PANEL   Collection Time    12/27/12  6:03 AM      Result Value Range   Sodium 142  135 - 145 mEq/L   Potassium 3.7  3.5 - 5.1 mEq/L   Chloride 112  96 - 112 mEq/L   CO2 24  19 - 32 mEq/L   Glucose, Bld 104 (*) 70 - 99 mg/dL   BUN 12  6 - 23 mg/dL   Creatinine, Ser 4.54  0.50 - 1.10 mg/dL   Calcium 9.8  8.4 - 09.8 mg/dL   Total Protein 6.0  6.0 - 8.3 g/dL   Albumin 3.2 (*) 3.5 - 5.2 g/dL   AST 119 (*) 0 - 37 U/L   ALT 263 (*) 0 - 35 U/L   Alkaline Phosphatase 96  39 - 117 U/L   Total Bilirubin 0.8  0.3 - 1.2 mg/dL   GFR calc non Af Amer 74 (*) >90 mL/min   GFR calc Af Amer 86 (*) >90 mL/min  CBC   Collection Time    12/27/12  6:03 AM      Result Value Range   WBC 5.1  4.0 - 10.5 K/uL   RBC 4.59  3.87 - 5.11 MIL/uL   Hemoglobin 12.1  12.0 - 15.0 g/dL   HCT 14.7  82.9 - 56.2 %   MCV 81.3  78.0 - 100.0 fL   MCH 26.4  26.0 - 34.0 pg   MCHC 32.4  30.0 - 36.0 g/dL   RDW 13.0  86.5 - 78.4 %   Platelets 139 (*) 150 - 400 K/uL  HEPATITIS B SURFACE ANTIGEN   Collection Time    12/27/12  1:47 PM      Result Value Range   Hepatitis B Surface  Ag NEGATIVE  NEGATIVE  HEPATITIS C ANTIBODY   Collection Time    12/27/12  1:47 PM      Result Value Range   HCV Ab NEGATIVE  NEGATIVE  LIPASE, BLOOD   Collection Time    12/27/12  1:47 PM      Result Value Range   Lipase 80 (*) 11 - 59 U/L  HEPATIC FUNCTION PANEL   Collection Time    12/28/12  5:14 AM      Result Value Range   Total Protein 5.9 (*) 6.0 - 8.3 g/dL   Albumin 3.2 (*) 3.5 - 5.2 g/dL   AST 70 (*) 0 - 37 U/L   ALT 162 (*) 0 - 35 U/L   Alkaline Phosphatase 87  39 - 117 U/L   Total Bilirubin 0.5  0.3 - 1.2 mg/dL   Bilirubin, Direct 0.1  0.0 - 0.3 mg/dL   Indirect Bilirubin 0.4  0.3 - 0.9 mg/dL  BASIC METABOLIC PANEL   Collection Time    12/28/12  5:14 AM      Result Value Range   Sodium 143  135 - 145 mEq/L   Potassium 3.8  3.5 - 5.1 mEq/L   Chloride 113 (*) 96 - 112 mEq/L   CO2 22  19 - 32 mEq/L   Glucose, Bld 106 (*) 70 - 99 mg/dL   BUN 11  6 - 23 mg/dL   Creatinine, Ser 4.54  0.50 - 1.10 mg/dL   Calcium 09.8  8.4 - 11.9 mg/dL   GFR calc non Af Amer 85 (*) >90 mL/min   GFR calc Af Amer >90  >90 mL/min  HEPATIC FUNCTION PANEL   Collection Time    12/29/12  5:59 AM      Result Value Range   Total Protein 6.2  6.0 - 8.3 g/dL   Albumin 3.3 (*) 3.5 - 5.2 g/dL   AST 31  0 - 37 U/L   ALT 112 (*) 0 - 35 U/L   Alkaline Phosphatase 86  39 - 117 U/L   Total Bilirubin 0.5  0.3 - 1.2 mg/dL   Bilirubin, Direct 0.1  0.0 - 0.3 mg/dL   Indirect Bilirubin 0.4  0.3 - 0.9 mg/dL  HEPATIC FUNCTION PANEL   Collection Time    12/30/12  5:45 AM      Result Value Range   Total Protein 6.2  6.0 - 8.3 g/dL   Albumin 3.4 (*) 3.5 - 5.2 g/dL   AST 22  0 - 37 U/L   ALT 82 (*) 0 - 35 U/L   Alkaline Phosphatase 84  39 - 117 U/L   Total Bilirubin 0.5  0.3 - 1.2 mg/dL   Bilirubin, Direct 0.1  0.0 - 0.3 mg/dL   Indirect Bilirubin 0.4  0.3 - 0.9 mg/dL  PROTIME-INR   Collection Time    12/30/12  5:45 AM      Result Value Range   Prothrombin Time 13.9  11.6 - 15.2 seconds   INR  1.09  0.00 - 1.49  HEPATIC FUNCTION PANEL   Collection Time    12/31/12  8:36 AM      Result Value Range   Total Protein 6.8  6.0 - 8.3 g/dL   Albumin 3.7  3.5 - 5.2 g/dL   AST 18  0 - 37 U/L   ALT 65 (*) 0 - 35 U/L   Alkaline Phosphatase 89  39 - 117 U/L   Total Bilirubin 0.6  0.3 - 1.2 mg/dL   Bilirubin, Direct 0.1  0.0 - 0.3 mg/dL   Indirect Bilirubin 0.5  0.3 - 0.9 mg/dL   Her cardiologist draws her labs and reportedly there are no problems.    Assessment Axis I Maj. depressive disorder Axis II deferred Axis III see medical history Axis IV are to moderate.  Plan: I took her vitals.  I  reviewed CC, tobacco/med/surg Hx, meds effects/ side effects, problem list, therapies and responses as well as current situation/symptoms discussed options. She will increase Neurontin to 300 mg twice a day and Lexapro to 20 mg every morning. She will continue to use Ambien as needed. At her request she'll return in 3 months but call if any symptoms worsen before that  See orders and pt instructions for more details.  MEDICATIONS this encounter: Meds ordered this encounter  Medications  . gabapentin (NEURONTIN) 300 MG capsule    Sig: Take 1 capsule (300 mg total) by mouth 2 (two) times daily.    Dispense:  30 capsule    Refill:  2  . escitalopram (LEXAPRO) 20 MG tablet    Sig: Take 1 tablet (20 mg total) by mouth daily.    Dispense:  30 tablet    Refill:  2    Medical Decision Making Problem Points:  Established problem, stable/improving (1), New problem, with additional work-up planned (4), Review of last therapy session (1) and Review of psycho-social stressors (1) Data Points:  Review or order clinical lab tests (1) Review of new medications or change in dosage (2)  I certify that outpatient services furnished can reasonably be expected to improve the patient's condition.   Diannia Ruder, MD

## 2013-05-11 ENCOUNTER — Telehealth: Payer: Self-pay | Admitting: Cardiovascular Disease

## 2013-05-11 MED ORDER — LOVASTATIN 40 MG PO TABS: 40.0000 mg | ORAL_TABLET | Freq: Every day | ORAL | Status: DC

## 2013-05-11 NOTE — Telephone Encounter (Signed)
Needs RX for Lovastatin sent to [facility]

## 2013-05-11 NOTE — Telephone Encounter (Signed)
rx sent to pharmacy by e-script  

## 2013-06-08 ENCOUNTER — Ambulatory Visit (HOSPITAL_COMMUNITY)
Admission: RE | Admit: 2013-06-08 | Discharge: 2013-06-08 | Disposition: A | Discharge status: Home / Self Care | Payer: Medicare Other | Source: Ambulatory Visit | Attending: Internal Medicine | Admitting: Internal Medicine

## 2013-06-08 ENCOUNTER — Other Ambulatory Visit (HOSPITAL_COMMUNITY): Payer: Self-pay | Admitting: Internal Medicine

## 2013-06-08 DIAGNOSIS — M25522 Pain in left elbow: Secondary | ICD-10-CM

## 2013-06-08 DIAGNOSIS — M25529 Pain in unspecified elbow: Secondary | ICD-10-CM | POA: Insufficient documentation

## 2013-06-28 ENCOUNTER — Telehealth: Payer: Self-pay

## 2013-06-28 NOTE — Telephone Encounter (Signed)
Message copied by Jolee Ewing on Mon Jun 28, 2013  1:54 PM ------      Message from: Shara Blazing A      Created: Mon Jun 28, 2013  1:32 PM      Regarding: RE: F/U Appt?       Pt was supposed to call back a week after 9-11 apt to update BP and pt never called the office back to advise BP per that was the determining factor, office number and instructions to call office printed in AVS      ----- Message -----         From: Leveda Anna         Sent: 06/28/2013   1:16 PM           To: Alba Destine, LPN      Subject: F/U Appt?                                                Kim,            Can you tell when this patient is supposed to come back?       ------

## 2013-06-28 NOTE — Telephone Encounter (Signed)
Called patient and left message for patient to call back for a f/u appt.  Per last Office note, patient was to call with BP readings to determine next appointment.  Patient did not call with readings, patient contacted to see if appointment needed.

## 2013-07-07 ENCOUNTER — Other Ambulatory Visit (HOSPITAL_COMMUNITY): Payer: Self-pay | Admitting: Psychiatry

## 2013-07-15 ENCOUNTER — Encounter: Payer: Self-pay | Admitting: Gastroenterology

## 2013-07-15 ENCOUNTER — Encounter (INDEPENDENT_AMBULATORY_CARE_PROVIDER_SITE_OTHER): Payer: Self-pay

## 2013-07-15 ENCOUNTER — Ambulatory Visit (INDEPENDENT_AMBULATORY_CARE_PROVIDER_SITE_OTHER): Payer: Medicare HMO | Admitting: Gastroenterology

## 2013-07-15 VITALS — BP 114/69 | HR 73 | Temp 97.4°F | Wt 195.8 lb

## 2013-07-15 DIAGNOSIS — K5900 Constipation, unspecified: Secondary | ICD-10-CM

## 2013-07-15 DIAGNOSIS — K5909 Other constipation: Secondary | ICD-10-CM | POA: Insufficient documentation

## 2013-07-15 DIAGNOSIS — R1013 Epigastric pain: Secondary | ICD-10-CM

## 2013-07-15 DIAGNOSIS — D126 Benign neoplasm of colon, unspecified: Secondary | ICD-10-CM

## 2013-07-15 DIAGNOSIS — R1032 Left lower quadrant pain: Secondary | ICD-10-CM

## 2013-07-15 DIAGNOSIS — K219 Gastro-esophageal reflux disease without esophagitis: Secondary | ICD-10-CM

## 2013-07-15 MED ORDER — LINACLOTIDE 145 MCG PO CAPS: ORAL_CAPSULE | ORAL | Status: DC

## 2013-07-15 NOTE — Progress Notes (Addendum)
Primary Care Physician: Rosita Fire, MD  Primary Gastroenterologist:  Garfield Cornea, MD   Chief Complaint  Patient presents with  . Colonoscopy    HPI: [name] is a 68 y.o. female here for f/u. Last seen during hospitalization in July 2014 when she presented with abdominal pain, abnormal LFTs. It was felt that she likely passed a stone. She ultimately underwent a cholecystectomy by Dr. Arnoldo Morale in October 2014.  She states she continues to have similar abdominal pain as before. She has pain located in the left lower abdomen which radiates upward. She has pain in epigastrium. Sometimes hurts with meals. Denies any right upper quadrant pain. BM every 3-4 days. Strains/stools hard. No melena. No brbpr. +mucous. Appetite good. Heartburn terrible, burning in esophagus. Has been on all the PPIs. No difference when does not take the Aciphex. Pepcid OTC helped for short period of time. Heartburn every day. Abdominal pain every day but some days worse than others. Daily pain in the left abdomen, epigastric pain comes and goes. No dysphagia to solid foods or pills.  Neurontin seems to make heartburn worse. Doesn't feel like getting gallbladder out has helped.  Her last endoscopy was in June 2013. She had a noncritical, widely patent Schatzki ring, small hiatal hernia, antral erosions. Biopsy showed reactive gastropathy. H. pylori negative. Last colonoscopy January 2013 with anal canal hemorrhoids, scattered submucosal petechiae left: Of doubtful clinical significance. She has a history of previous 2 adenomas and next colonoscopy is due for January 2018.   She was sent to [facility] back in 2013 for pH/impedance study given her refractory heartburn. Unfortunately they could not do the study as her nasal cavities could not be intubated with the probe. She subsequently saw Dr. Steward Drone who offered repeat EGD and gastric emptying study but the patient was not interested.   Current Outpatient  Prescriptions  Medication Sig Dispense Refill  . albuterol (PROVENTIL HFA;VENTOLIN HFA) 108 (90 BASE) MCG/ACT inhaler Inhale 2 puffs into the lungs every 6 (six) hours as needed. For shortness of breath      . amLODipine (NORVASC) 5 MG tablet Take 5 mg by mouth daily.      Marland Kitchen atenolol (TENORMIN) 50 MG tablet Take 50-100 mg by mouth 2 (two) times daily. 100 mg in the morning and  50 at night      . BOTOX 200 UNITS SOLR Inject 200 Units into the skin every 3 (three) months.      . escitalopram (LEXAPRO) 20 MG tablet Take 1 tablet (20 mg total) by mouth daily.  30 tablet  2  . gabapentin (NEURONTIN) 300 MG capsule Take 1 capsule (300 mg total) by mouth 2 (two) times daily.  30 capsule  2  . HYDROcodone-acetaminophen (NORCO) 7.5-325 MG per tablet Take 1 tablet by mouth every 6 (six) hours as needed for pain.  30 tablet  0  . levocetirizine (XYZAL) 5 MG tablet Take 5 mg by mouth every evening.       . lovastatin (MEVACOR) 40 MG tablet Take 1 tablet (40 mg total) by mouth at bedtime.  30 tablet  5  . ondansetron (ZOFRAN) 4 MG tablet Take 4 mg by mouth every 8 (eight) hours as needed.       . potassium chloride SA (K-DUR,KLOR-CON) 20 MEQ tablet Take 20 mEq by mouth 2 (two) times daily.      . RABEprazole (ACIPHEX) 20 MG tablet Take 1 tablet (20 mg total) by mouth 2 (two) times daily.  60 tablet  5  . terazosin (HYTRIN) 10 MG capsule Take 10 mg by mouth at bedtime.      . traMADol (ULTRAM) 50 MG tablet Take 50 mg by mouth every 6 (six) hours as needed. For pain      . zolpidem (AMBIEN) 10 MG tablet Take 1 tablet (10 mg total) by mouth at bedtime as needed for sleep.  30 tablet  3   No current facility-administered medications for this visit.    Allergies as of 07/15/2013 - Review Complete 07/15/2013  Allergen Reaction Noted  . Elavil [amitriptyline] Other (See Comments) 06/03/2012  . Abilify [aripiprazole] Other (See Comments) 11/25/2012  . Trazodone and nefazodone Other (See Comments) 08/31/2012  .  Codeine Hives, Nausea Only, and Other (See Comments)   . Latex Hives 11/21/2011  . Penicillins Hives   . Polyethylene glycol Other (See Comments) 12/26/2012  . Sulfonamide derivatives Nausea And Vomiting   . Cymbalta [duloxetine hcl] Rash 03/10/2013  . Remeron [mirtazapine] Other (See Comments) 05/06/2012   Past Medical History  Diagnosis Date  . Hypertension   . Hyperlipidemia   . Gastroesophageal reflux disease   . Diverticulosis   . Anxiety and depression   . Adenomatous colon polyp 2006    excised in 2006 & 2010Due surveillance 06/2013  . Chronic back pain   . Thyroid nodule   . Fibromyalgia   . Migraines   . Insomnia   . Chronic constipation   . Asthma   . History of benign esophageal tumor 2010    granular cell esophageal tumor (Dx 06/2008), resected via EMR 2011, due repeat EGD 02/2012  . Chest discomfort   . Hiatal hernia   . Depression   . Anxiety   . Sleep apnea     Stop Bang score of 5. Pt said she was told by Dr. Merlene Laughter that she had "a little bit" of sleep apena, but not bad enough to treat.   Past Surgical History  Procedure Laterality Date  . Tonsillectomy    . Abdominal hysterectomy    . Total knee arthroplasty  2002    Right; previous arthroscopic surgery  . Shoulder arthroscopy      Right; bone spurs removed  . Lung biopsy      negative  . 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 Right oophorectomy      benign disease  . Colonoscopy  06/2008, 06/2011    sigmoid tics, tubular adenoma; 2013: anal canal hemorrhoids  . Eus  08/2010    Dr [facility] with EGD. Retained food. No recurrent esophageal lesion, bx negative.  . Colonoscopy  07/10/2011    Anal canal hemorrhoids likely the cause of hematochezia in the setting of constipation; otherwise normal rectum ;submucosal  petechiae in left colon of doubtful clinical significance; otherwise, normal colon  . Esophagogastroduodenoscopy  12/11/2011    [id number]  Schatzki's ring; otherwise normal/Small hiatal hernia. Antral and bulbar erosions  . [facility] ph study  12/11/2011    Procedure: BRAVO [place] STUDY;  Surgeon: Daneil Dolin, MD;  Location: [facility] SUITE;  Service: Endoscopy;  Laterality: N/A;  . Cholecystectomy N/A 04/09/2013    Procedure: LAPAROSCOPIC CHOLECYSTECTOMY;  Surgeon: Jamesetta So, MD;  Location: [facility] ORS;  Service: General;  Laterality: N/A;   Family History  Problem Relation Age of Onset  . Stroke Father   . Alcohol abuse Father   . Heart attack Mother   . Depression Mother   .  Anxiety disorder Mother   . Colon cancer Paternal Aunt   . Colon cancer Paternal Uncle   . Dementia Maternal Uncle   . ADD / ADHD Neg Hx   . Bipolar disorder Neg Hx   . Drug abuse Neg Hx   . OCD Neg Hx   . Paranoid behavior Neg Hx   . Schizophrenia Neg Hx   . Seizures Neg Hx   . Sexual abuse Neg Hx   . Physical abuse Neg Hx    History   Social History  . Marital Status: Single    Spouse Name: N/A    Number of Children: 1  . Years of Education: N/A   Occupational History  . disabled    Social History Main Topics  . Smoking status: Never Smoker   . Smokeless tobacco: Never Used  . Alcohol Use: No  . Drug Use: No  . Sexual Activity: No   Other Topics Concern  . None   Social History Narrative  . None    ROS:  General: Negative for anorexia, weight loss, fever, chills, fatigue, weakness. ENT: Negative for hoarseness, difficulty swallowing , nasal congestion. CV: Negative for chest pain, angina, palpitations, dyspnea on exertion, peripheral edema.  Respiratory: Negative for dyspnea at rest, dyspnea on exertion, cough, sputum, wheezing.  GI: See history of present illness. GU:  Negative for dysuria, hematuria, urinary incontinence, urinary frequency, nocturnal urination.  Endo: Negative for unusual weight change.    Physical Examination:   BP 114/69  Pulse 73  Temp(Src) 97.4 F (36.3 C) (Oral)  Wt 195 lb 12.8 oz (88.814  kg)  General: Well-nourished, well-developed in no acute distress.  Eyes: No icterus. Mouth: Oropharyngeal mucosa moist and pink , no lesions erythema or exudate. Lungs: Clear to auscultation bilaterally.  Heart: Regular rate and rhythm, no murmurs rubs or gallops.  Abdomen: Bowel sounds are normal, minimal LLQ/epig tenderness, nondistended, no hepatosplenomegaly or masses, no abdominal bruits or hernia , no rebound or guarding.   Extremities: No lower extremity edema. No clubbing or deformities. Neuro: Alert and oriented x 4   Skin: Warm and dry, no jaundice.   Psych: Alert and cooperative, normal mood and affect.  Labs:  Lab Results  Component Value Date   ALT 12 04/05/2013   AST 14 04/05/2013   ALKPHOS 70 04/05/2013   BILITOT 0.5 04/05/2013   Lab Results  Component Value Date   CREATININE 0.95 04/05/2013   BUN 14 04/05/2013   NA 143 04/05/2013   K 4.4 04/05/2013   CL 105 04/05/2013   CO2 28 04/05/2013   Lab Results  Component Value Date   WBC 4.8 04/05/2013   HGB 13.2 04/05/2013   HCT 40.9 04/05/2013   MCV 83.1 04/05/2013   PLT 175 04/05/2013    Imaging Studies: No results found.

## 2013-07-15 NOTE — Assessment & Plan Note (Signed)
Likely secondary to constipation. She is up-to-date on her colonoscopy. Not due until 2018. Trial of Linzess 14mcg daily. Keep a stool diary and notations about abdominal pain. Ov in six weeks with Dr. Gala Romney.

## 2013-07-15 NOTE — Assessment & Plan Note (Addendum)
Ongoing, intermittent epigastric pain. States no better since her gb was removed. ?related to GERD or gastropathy. We will have her check LFTs 6-8 hours after next significant episode to make sure no biliary etiology. Ov with Dr. Gala Romney in six weeks.

## 2013-07-15 NOTE — Assessment & Plan Note (Signed)
Continues to be ongoing issues for her. She has had multiple PPI failure as previously noted. H/O granular cell tumor of the esophagus in 2010. EUS by Dr. Newman Pies in 2012 showed no recurrent tumor. EGD in 2013 showed no recurrent tumor. She saw Dr. Newman Pies in January 2013 for refractory heartburn, EGD and gastric emptying study offered the patient did not want to pursue any further workup at that time. Patient states she to stop the AcipHex and has no change in her symptoms. To discuss further with Dr. Gala Romney. Since she was unable to have a pH/impeded study due to difficult nasal intubation, wonder if she would be a candidate for pH study through [facility] a pH probe placement instead.

## 2013-07-15 NOTE — Patient Instructions (Signed)
1. Start Linzess 162mcg daily on empty stomach for your constipation. 2. Have your lab work done 6-8 hours after your next episode of upper abdominal pain. 3. Office visit with Dr. Gala Romney in 6 weeks.  4. Keep a stool diary and document your abdominal pain as well.

## 2013-07-19 ENCOUNTER — Telehealth (HOSPITAL_COMMUNITY): Payer: Self-pay | Admitting: Psychiatry

## 2013-07-19 NOTE — Telephone Encounter (Signed)
Supposed to be on neurontin 300 mg bid

## 2013-07-19 NOTE — Progress Notes (Signed)
cc'd to pcp 

## 2013-07-22 ENCOUNTER — Encounter (HOSPITAL_COMMUNITY): Payer: Self-pay | Admitting: Psychiatry

## 2013-07-22 ENCOUNTER — Ambulatory Visit (INDEPENDENT_AMBULATORY_CARE_PROVIDER_SITE_OTHER): Payer: Managed Care, Other (non HMO) | Admitting: Psychiatry

## 2013-07-22 VITALS — BP 140/88 | Ht 62.0 in | Wt 193.0 lb

## 2013-07-22 DIAGNOSIS — F32A Depression, unspecified: Secondary | ICD-10-CM

## 2013-07-22 DIAGNOSIS — F329 Major depressive disorder, single episode, unspecified: Secondary | ICD-10-CM

## 2013-07-22 DIAGNOSIS — F5105 Insomnia due to other mental disorder: Secondary | ICD-10-CM

## 2013-07-22 DIAGNOSIS — F341 Dysthymic disorder: Secondary | ICD-10-CM

## 2013-07-22 DIAGNOSIS — F419 Anxiety disorder, unspecified: Principal | ICD-10-CM

## 2013-07-22 DIAGNOSIS — F489 Nonpsychotic mental disorder, unspecified: Secondary | ICD-10-CM

## 2013-07-22 MED ORDER — GABAPENTIN 300 MG PO CAPS: 300.0000 mg | ORAL_CAPSULE | Freq: Two times a day (BID) | ORAL | Status: DC

## 2013-07-22 MED ORDER — ZOLPIDEM TARTRATE 10 MG PO TABS: 10.0000 mg | ORAL_TABLET | Freq: Every evening | ORAL | Status: DC | PRN

## 2013-07-22 MED ORDER — CLONAZEPAM 0.25 MG PO TBDP: ORAL_TABLET | ORAL | Status: DC

## 2013-07-22 MED ORDER — HYDROXYZINE PAMOATE 50 MG PO CAPS: 50.0000 mg | ORAL_CAPSULE | Freq: Every day | ORAL | Status: DC

## 2013-07-22 MED ORDER — ESCITALOPRAM OXALATE 20 MG PO TABS: 20.0000 mg | ORAL_TABLET | Freq: Every day | ORAL | Status: DC

## 2013-07-22 MED ORDER — CLONAZEPAM 0.5 MG PO TABS: ORAL_TABLET | ORAL | Status: DC

## 2013-07-22 NOTE — Progress Notes (Signed)
[name], female   DOB: [birth year], 68 y.o.   MRN: [id number] [name], female   DOB: [birth year], 68 y.o.   MRN: [id number] [name], female   DOB: [birth year], 68 y.o.   MRN: [id number] [facility] [id number] Progress Note [name] MRN: [id number] DOB: January 02, 1946 Age: 68 y.o.  Date: 07/22/2013 Start Time: 9:45 AM End Time: 10:10 AM  Chief Complaint: Chief Complaint  Patient presents with  . Anxiety  . Depression  . Follow-up    Subjective: "I'm doing a little better."  This patient is a 68 year old black female who lives with her 25 year old daughter who has mild mental retardation. She lives in [place]. She is a retired [employer]. The patient states that she's been depressed "all my life.". She can't give any specific reasons for the depression but notes that at age 68 she was already taking medication to help with sleep. In her 4s her family doctor diagnosed with schizophrenia although she had no symptoms of paranoia or delusions or hallucinations. She was given medication for this.  Since 1997 she's been coming here with symptoms of depression. She's also had significant problems with fibromyalgia and fatigue. In Neurontin and Ambien have helped. Lately however she's become more depressed and worried. She has had gallbladder surgery and she's concerned about how is going to go. For some reason she's been thinking a lot about death. She doesn't have anyone to stay with her daughter something goes wrong. She's been more negative but is densely not suicidal but her energy has dropped. In general she is a very functional person  The patient returns after 2 months. She's been a lot more anxious and having panic attacks. 4 of her cousins died in the last month. She seems more stressed. She also mentions mild memory loss which may be secondary to [place]. She's tried hydroxyzine for sleep in the past and this might be a safer  alternative. She's not getting out as much because of the cold but plans to go back to the senior center when it warms up.   Current psychiatric medication Neurontin 300 mg twice a day Ambien 10 mg at bedtime Lexapro 10 mg every morning  Vitals: BP 140/88  Ht 5\' 2"  (1.575 m)  Wt 193 lb (87.544 kg)  BMI 35.29 kg/m2  Psychiatric history Patient has a long history of depression.  She has been seeing in this office since May 1997.  Patient denies any history of suicidal attempt however endorse chronic depression and anxiety.  In the past she has taken Prozac but did not like after taking few doses, she also has taken Seroquel, Abilify, Xanax, Pamelor, and amitriptyline.  Patient denies a history of paranoia or delusions however there are times when she has been very depressed and have a lot of negative symptoms.  Alcohol and substance use history Patient denies any history of alcohol or substance use.  Allergies: Allergies  Allergen Reactions  . Elavil [Amitriptyline] Other (See Comments)    Felt really nervous and felt like something was hold her feet or arm and/ or AM headache on it and back on it and went away off it.   . Abilify [Aripiprazole] Other (See Comments)    Dystonic reaction  . Trazodone And Nefazodone Other (See Comments)    Brought back bad dreams of things in the past  . Codeine Hives, Nausea Only and Other (See Comments)    Feels funny  .  Latex Hives  . Penicillins Hives  . Polyethylene Glycol Other (See Comments)    Per allergy test  . Sulfonamide Derivatives Nausea And Vomiting  . Cymbalta [Duloxetine Hcl] Rash  . Remeron [Mirtazapine] Other (See Comments)    Caused lots of strange, weird, crazy dreams.   Medical History: Past Medical History  Diagnosis Date  . Hypertension   . Hyperlipidemia   . Gastroesophageal reflux disease   . Diverticulosis   . Anxiety and depression   . Adenomatous colon polyp 2006    excised in 2006 & 2010Due surveillance 06/2013   . Chronic back pain   . Thyroid nodule   . Fibromyalgia   . Migraines   . Insomnia   . Chronic constipation   . Asthma   . History of benign esophageal tumor 2010    granular cell esophageal tumor (Dx 06/2008), resected via EMR 2011, due repeat EGD 02/2012  . Chest discomfort   . Hiatal hernia   . Depression   . Anxiety   . Sleep apnea     Stop Bang score of 5. Pt said she was told by Dr. Merlene Laughter that she had "a little bit" of sleep apena, but not bad enough to treat.  Patient has history of hypertension, hyperlipidemia, GERD, diverticulosis, colonic polyp, chronic back pain, fibromyalgia, chronic constipation, benign tumor of esophagus and chronic headache.  Her primary care physician is Dr. Legrand Rams, she sees Dr. Paulita Cradle for chronic pain, fibromyalgia and headache. Her cardiologist is Dr Elvera Maria. Surgical History: Past Surgical History  Procedure Laterality Date  . Tonsillectomy    . Abdominal hysterectomy    . Total knee arthroplasty  2002    Right; previous arthroscopic surgery  . Shoulder arthroscopy      Right; bone spurs removed  . Lung biopsy      negative  . 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 Right oophorectomy      benign disease  . Colonoscopy  06/2008, 06/2011    sigmoid tics, tubular adenoma; 2013: anal canal hemorrhoids  . Eus  08/2010    Dr [facility] with EGD. Retained food. No recurrent esophageal lesion, bx negative.  . Colonoscopy  07/10/2011    Anal canal hemorrhoids likely the cause of hematochezia in the setting of constipation; otherwise normal rectum ;submucosal  petechiae in left colon of doubtful clinical significance; otherwise, normal colon  . Esophagogastroduodenoscopy  12/11/2011    [id number] Schatzki's ring; otherwise normal/Small hiatal hernia. Antral and bulbar erosions  . Bravo ph study  12/11/2011    Procedure: BRAVO [place] STUDY;  Surgeon: Daneil Dolin, MD;  Location: [facility];  Service:  Endoscopy;  Laterality: N/A;  . Cholecystectomy N/A 04/09/2013    Procedure: LAPAROSCOPIC CHOLECYSTECTOMY;  Surgeon: Jamesetta So, MD;  Location: [facility];  Service: General;  Laterality: N/A;   Family History: family history includes Alcohol abuse in her father; Anxiety disorder in her mother; Colon cancer in her paternal aunt and paternal uncle; Dementia in her maternal uncle; Depression in her mother; Heart attack in her mother; Stroke in her father. There is no history of ADD / ADHD, Bipolar disorder, Drug abuse, OCD, Paranoid behavior, Schizophrenia, Seizures, Sexual abuse, or Physical abuse. Reviewed and nothing new today again.  Mental status examination Patient is casually dressed and fairly groomed. She is calm cooperative and maintained fair eye contact. She described her mood is  is a bit sad and her affect is  mood appropriate. She still has chronic pain  Her speech is coherent but slow.  Her thought processes clear and logical. She admits to being sad but denies suicidal ideation but no plans, no active or passive homicidal thoughts. She denies any auditory or visual hallucination. There no psychotic symptoms present at this time. She's alert and oriented x3. Her insight judgment impulse control is okay  Lab Results:  Results for orders placed during the hospital encounter of 04/05/13 (from the past 8736 hour(s))  HEPATIC FUNCTION PANEL   Collection Time    04/05/13  1:15 PM      Result Value Range   Total Protein 6.4  6.0 - 8.3 g/dL   Albumin 3.8  3.5 - 5.2 g/dL   AST 14  0 - 37 U/L   ALT 12  0 - 35 U/L   Alkaline Phosphatase 70  39 - 117 U/L   Total Bilirubin 0.5  0.3 - 1.2 mg/dL   Bilirubin, Direct 0.1  0.0 - 0.3 mg/dL   Indirect Bilirubin 0.4  0.3 - 0.9 mg/dL  BASIC METABOLIC PANEL   Collection Time    04/05/13  1:15 PM      Result Value Range   Sodium 143  135 - 145 mEq/L   Potassium 4.4  3.5 - 5.1 mEq/L   Chloride 105  96 - 112 mEq/L   CO2 28  19 - 32 mEq/L    Glucose, Bld 93  70 - 99 mg/dL   BUN 14  6 - 23 mg/dL   Creatinine, Ser 0.95  0.50 - 1.10 mg/dL   Calcium 10.7 (*) 8.4 - 10.5 mg/dL   GFR calc non Af Amer 61 (*) >90 mL/min   GFR calc Af Amer 70 (*) >90 mL/min  CBC WITH DIFFERENTIAL   Collection Time    04/05/13  1:15 PM      Result Value Range   WBC 4.8  4.0 - 10.5 K/uL   RBC 4.92  3.87 - 5.11 MIL/uL   Hemoglobin 13.2  12.0 - 15.0 g/dL   HCT 40.9  36.0 - 46.0 %   MCV 83.1  78.0 - 100.0 fL   MCH 26.8  26.0 - 34.0 pg   MCHC 32.3  30.0 - 36.0 g/dL   RDW 15.8 (*) 11.5 - 15.5 %   Platelets 175  150 - 400 K/uL   Neutrophils Relative % 59  43 - 77 %   Neutro Abs 2.8  1.7 - 7.7 K/uL   Lymphocytes Relative 32  12 - 46 %   Lymphs Abs 1.5  0.7 - 4.0 K/uL   Monocytes Relative 7  3 - 12 %   Monocytes Absolute 0.3  0.1 - 1.0 K/uL   Eosinophils Relative 2  0 - 5 %   Eosinophils Absolute 0.1  0.0 - 0.7 K/uL   Basophils Relative 0  0 - 1 %   Basophils Absolute 0.0  0.0 - 0.1 K/uL  Results for orders placed during the hospital encounter of 12/26/12 (from the past 8736 hour(s))  CBC WITH DIFFERENTIAL   Collection Time    12/26/12  6:49 AM      Result Value Range   WBC 4.9  4.0 - 10.5 K/uL   RBC 4.74  3.87 - 5.11 MIL/uL   Hemoglobin 12.6  12.0 - 15.0 g/dL   HCT 38.8  36.0 - 46.0 %   MCV 81.9  78.0 - 100.0 fL   MCH 26.6  26.0 -  34.0 pg   MCHC 32.5  30.0 - 36.0 g/dL   RDW 15.2  11.5 - 15.5 %   Platelets 125 (*) 150 - 400 K/uL   Neutrophils Relative % 91 (*) 43 - 77 %   Neutro Abs 4.4  1.7 - 7.7 K/uL   Lymphocytes Relative 7 (*) 12 - 46 %   Lymphs Abs 0.3 (*) 0.7 - 4.0 K/uL   Monocytes Relative 1 (*) 3 - 12 %   Monocytes Absolute 0.1  0.1 - 1.0 K/uL   Eosinophils Relative 0  0 - 5 %   Eosinophils Absolute 0.0  0.0 - 0.7 K/uL   Basophils Relative 0  0 - 1 %   Basophils Absolute 0.0  0.0 - 0.1 K/uL  BASIC METABOLIC PANEL   Collection Time    12/26/12  6:49 AM      Result Value Range   Sodium 139  135 - 145 mEq/L   Potassium 3.2 (*)  3.5 - 5.1 mEq/L   Chloride 104  96 - 112 mEq/L   CO2 27  19 - 32 mEq/L   Glucose, Bld 137 (*) 70 - 99 mg/dL   BUN 16  6 - 23 mg/dL   Creatinine, Ser 0.89  0.50 - 1.10 mg/dL   Calcium 10.8 (*) 8.4 - 10.5 mg/dL   GFR calc non Af Amer 66 (*) >90 mL/min   GFR calc Af Amer 77 (*) >90 mL/min  TROPONIN I   Collection Time    12/26/12  6:49 AM      Result Value Range   Troponin I <0.30  <0.30 ng/mL  LIPASE, BLOOD   Collection Time    12/26/12  7:43 AM      Result Value Range   Lipase 47  11 - 59 U/L  HEPATIC FUNCTION PANEL   Collection Time    12/26/12  7:43 AM      Result Value Range   Total Protein 6.5  6.0 - 8.3 g/dL   Albumin 3.8  3.5 - 5.2 g/dL   AST 247 (*) 0 - 37 U/L   ALT 121 (*) 0 - 35 U/L   Alkaline Phosphatase 90  39 - 117 U/L   Total Bilirubin 0.8  0.3 - 1.2 mg/dL   Bilirubin, Direct 0.4 (*) 0.0 - 0.3 mg/dL   Indirect Bilirubin 0.4  0.3 - 0.9 mg/dL  D-DIMER, QUANTITATIVE   Collection Time    12/26/12  7:43 AM      Result Value Range   D-Dimer, Quant 0.31  0.00 - 0.48 ug/mL-FEU  URINE CULTURE   Collection Time    12/26/12  8:41 AM      Result Value Range   Specimen Description URINE, CLEAN CATCH     Special Requests NONE     Culture  Setup Time 12/26/2012 19:29     Colony Count 7,000 COLONIES/ML     Culture INSIGNIFICANT GROWTH     Report Status 12/27/2012 FINAL    URINALYSIS, ROUTINE W REFLEX MICROSCOPIC   Collection Time    12/26/12  8:41 AM      Result Value Range   Color, Urine YELLOW  YELLOW   APPearance CLEAR  CLEAR   Specific Gravity, Urine >1.030 (*) 1.005 - 1.030   pH 5.5  5.0 - 8.0   Glucose, UA NEGATIVE  NEGATIVE mg/dL   Hgb urine dipstick NEGATIVE  NEGATIVE   Bilirubin Urine NEGATIVE  NEGATIVE   Ketones, ur NEGATIVE  NEGATIVE mg/dL  Protein, ur NEGATIVE  NEGATIVE mg/dL   Urobilinogen, UA 0.2  0.0 - 1.0 mg/dL   Nitrite NEGATIVE  NEGATIVE   Leukocytes, UA TRACE (*) NEGATIVE  URINE MICROSCOPIC-ADD ON   Collection Time    12/26/12  8:41 AM       Result Value Range   Squamous Epithelial / LPF RARE  RARE   WBC, UA 3-6  <3 WBC/hpf   RBC / HPF 0-2  <3 RBC/hpf   Bacteria, UA RARE  RARE   Urine-Other MUCOUS PRESENT    TROPONIN I   Collection Time    12/26/12 12:30 PM      Result Value Range   Troponin I <0.30  <0.30 ng/mL  TROPONIN I   Collection Time    12/26/12  5:25 PM      Result Value Range   Troponin I <0.30  <0.30 ng/mL  COMPREHENSIVE METABOLIC PANEL   Collection Time    12/27/12  6:03 AM      Result Value Range   Sodium 142  135 - 145 mEq/L   Potassium 3.7  3.5 - 5.1 mEq/L   Chloride 112  96 - 112 mEq/L   CO2 24  19 - 32 mEq/L   Glucose, Bld 104 (*) 70 - 99 mg/dL   BUN 12  6 - 23 mg/dL   Creatinine, Ser 0.81  0.50 - 1.10 mg/dL   Calcium 9.8  8.4 - 10.5 mg/dL   Total Protein 6.0  6.0 - 8.3 g/dL   Albumin 3.2 (*) 3.5 - 5.2 g/dL   AST 211 (*) 0 - 37 U/L   ALT 263 (*) 0 - 35 U/L   Alkaline Phosphatase 96  39 - 117 U/L   Total Bilirubin 0.8  0.3 - 1.2 mg/dL   GFR calc non Af Amer 74 (*) >90 mL/min   GFR calc Af Amer 86 (*) >90 mL/min  CBC   Collection Time    12/27/12  6:03 AM      Result Value Range   WBC 5.1  4.0 - 10.5 K/uL   RBC 4.59  3.87 - 5.11 MIL/uL   Hemoglobin 12.1  12.0 - 15.0 g/dL   HCT 37.3  36.0 - 46.0 %   MCV 81.3  78.0 - 100.0 fL   MCH 26.4  26.0 - 34.0 pg   MCHC 32.4  30.0 - 36.0 g/dL   RDW 15.3  11.5 - 15.5 %   Platelets 139 (*) 150 - 400 K/uL  HEPATITIS B SURFACE ANTIGEN   Collection Time    12/27/12  1:47 PM      Result Value Range   Hepatitis B Surface Ag NEGATIVE  NEGATIVE  HEPATITIS C ANTIBODY   Collection Time    12/27/12  1:47 PM      Result Value Range   HCV Ab NEGATIVE  NEGATIVE  LIPASE, BLOOD   Collection Time    12/27/12  1:47 PM      Result Value Range   Lipase 80 (*) 11 - 59 U/L  HEPATIC FUNCTION PANEL   Collection Time    12/28/12  5:14 AM      Result Value Range   Total Protein 5.9 (*) 6.0 - 8.3 g/dL   Albumin 3.2 (*) 3.5 - 5.2 g/dL   AST 70 (*) 0 - 37  U/L   ALT 162 (*) 0 - 35 U/L   Alkaline Phosphatase 87  39 - 117 U/L   Total Bilirubin 0.5  0.3 - 1.2 mg/dL   Bilirubin, Direct 0.1  0.0 - 0.3 mg/dL   Indirect Bilirubin 0.4  0.3 - 0.9 mg/dL  BASIC METABOLIC PANEL   Collection Time    12/28/12  5:14 AM      Result Value Range   Sodium 143  135 - 145 mEq/L   Potassium 3.8  3.5 - 5.1 mEq/L   Chloride 113 (*) 96 - 112 mEq/L   CO2 22  19 - 32 mEq/L   Glucose, Bld 106 (*) 70 - 99 mg/dL   BUN 11  6 - 23 mg/dL   Creatinine, Ser 0.79  0.50 - 1.10 mg/dL   Calcium 10.0  8.4 - 10.5 mg/dL   GFR calc non Af Amer 85 (*) >90 mL/min   GFR calc Af Amer >90  >90 mL/min  HEPATIC FUNCTION PANEL   Collection Time    12/29/12  5:59 AM      Result Value Range   Total Protein 6.2  6.0 - 8.3 g/dL   Albumin 3.3 (*) 3.5 - 5.2 g/dL   AST 31  0 - 37 U/L   ALT 112 (*) 0 - 35 U/L   Alkaline Phosphatase 86  39 - 117 U/L   Total Bilirubin 0.5  0.3 - 1.2 mg/dL   Bilirubin, Direct 0.1  0.0 - 0.3 mg/dL   Indirect Bilirubin 0.4  0.3 - 0.9 mg/dL  HEPATIC FUNCTION PANEL   Collection Time    12/30/12  5:45 AM      Result Value Range   Total Protein 6.2  6.0 - 8.3 g/dL   Albumin 3.4 (*) 3.5 - 5.2 g/dL   AST 22  0 - 37 U/L   ALT 82 (*) 0 - 35 U/L   Alkaline Phosphatase 84  39 - 117 U/L   Total Bilirubin 0.5  0.3 - 1.2 mg/dL   Bilirubin, Direct 0.1  0.0 - 0.3 mg/dL   Indirect Bilirubin 0.4  0.3 - 0.9 mg/dL  PROTIME-INR   Collection Time    12/30/12  5:45 AM      Result Value Range   Prothrombin Time 13.9  11.6 - 15.2 seconds   INR 1.09  0.00 - 1.49  HEPATIC FUNCTION PANEL   Collection Time    12/31/12  8:36 AM      Result Value Range   Total Protein 6.8  6.0 - 8.3 g/dL   Albumin 3.7  3.5 - 5.2 g/dL   AST 18  0 - 37 U/L   ALT 65 (*) 0 - 35 U/L   Alkaline Phosphatase 89  39 - 117 U/L   Total Bilirubin 0.6  0.3 - 1.2 mg/dL   Bilirubin, Direct 0.1  0.0 - 0.3 mg/dL   Indirect Bilirubin 0.5  0.3 - 0.9 mg/dL   Her cardiologist draws her labs and  reportedly there are no problems.    Assessment Axis I Maj. depressive disorder Axis II deferred Axis III see medical history Axis IV are to moderate.  Plan: I took her vitals.  I reviewed CC, tobacco/med/surg Hx, meds effects/ side effects, problem list, therapies and responses as well as current situation/symptoms discussed options. She will continue Lexapro Neurontin and add clonazepam 0.25 mg twice a day as needed for panic attacks She will continue to use Ambien as needed. She'll return in four-week and I will also have her restart her counseling with Maurice Small but call if any symptoms worsen before that  See orders and pt  instructions for more details.  MEDICATIONS this encounter: Meds ordered this encounter  Medications  . gabapentin (NEURONTIN) 300 MG capsule    Sig: Take 1 capsule (300 mg total) by mouth 2 (two) times daily.    Dispense:  60 capsule    Refill:  2  . escitalopram (LEXAPRO) 20 MG tablet    Sig: Take 1 tablet (20 mg total) by mouth daily.    Dispense:  30 tablet    Refill:  2  . zolpidem (AMBIEN) 10 MG tablet    Sig: Take 1 tablet (10 mg total) by mouth at bedtime as needed for sleep.    Dispense:  30 tablet    Refill:  3  . hydrOXYzine (VISTARIL) 50 MG capsule    Sig: Take 1 capsule (50 mg total) by mouth at bedtime.    Dispense:  30 capsule    Refill:  0  . DISCONTD: clonazePAM (KLONOPIN) 0.25 MG disintegrating tablet    Sig: Take one tablet twice a day as needed for anxiety    Dispense:  60 tablet    Refill:  2  . clonazePAM (KLONOPIN) 0.5 MG tablet    Sig: Take one half table bid as needed for anxiety    Dispense:  30 tablet    Refill:  2    Medical Decision Making Problem Points:  Established problem, stable/improving (1), New problem, with additional work-up planned (4), Review of last therapy session (1) and Review of psycho-social stressors (1) Data Points:  Review or order clinical lab tests (1) Review of new medications or change in  dosage (2)  I certify that outpatient services furnished can reasonably be expected to improve the patient's condition.   Levonne Spiller, MD

## 2013-08-02 ENCOUNTER — Ambulatory Visit (INDEPENDENT_AMBULATORY_CARE_PROVIDER_SITE_OTHER): Payer: Managed Care, Other (non HMO) | Admitting: Cardiology

## 2013-08-02 ENCOUNTER — Encounter: Payer: Self-pay | Admitting: Cardiology

## 2013-08-02 VITALS — BP 127/58 | HR 55 | Ht 62.0 in | Wt 195.0 lb

## 2013-08-02 DIAGNOSIS — R002 Palpitations: Secondary | ICD-10-CM

## 2013-08-02 DIAGNOSIS — R42 Dizziness and giddiness: Secondary | ICD-10-CM

## 2013-08-02 NOTE — Patient Instructions (Signed)
Your physician recommends that you schedule a follow-up appointment in: [place] will receive a reminder letter two months in advance reminding you to call and schedule your appointment. If you don't receive this letter, please contact our office.  Your physician has recommended you make the following change in your medication:  STOP HYTRIN

## 2013-08-02 NOTE — Progress Notes (Signed)
Clinical Summary [name] is a 68 y.o.female seen today for follow up of the following medical problems.   1. Lightheadedness/Dizziness - started approx 1 year ago. Feeling of lightheadedness, but at times can feel like the room is spinning. Can happen laying down, sitting down, or standing. No association w/ exertion. Symptoms today reported as more the feeling of the room spinning, often can roll over in bed and feel as if bed is spinning.  - event monitor shows just mild sinus brady which is her baseline   2. Palpitations - no significant arrythmias on 21 day event monitor - continued on atenolol  Past Medical History  Diagnosis Date  . Hypertension   . Hyperlipidemia   . Gastroesophageal reflux disease   . Diverticulosis   . Anxiety and depression   . Adenomatous colon polyp 2006    excised in 2006 & 2010Due surveillance 06/2013  . Chronic back pain   . Thyroid nodule   . Fibromyalgia   . Migraines   . Insomnia   . Chronic constipation   . Asthma   . History of benign esophageal tumor 2010    granular cell esophageal tumor (Dx 06/2008), resected via EMR 2011, due repeat EGD 02/2012  . Chest discomfort   . Hiatal hernia   . Depression   . Anxiety   . Sleep apnea     Stop Bang score of 5. Pt said she was told by Dr. Merlene Laughter that she had "a little bit" of sleep apena, but not bad enough to treat.     Allergies  Allergen Reactions  . Elavil [Amitriptyline] Other (See Comments)    Felt really nervous and felt like something was hold her feet or arm and/ or AM headache on it and back on it and went away off it.   . Abilify [Aripiprazole] Other (See Comments)    Dystonic reaction  . Trazodone And Nefazodone Other (See Comments)    Brought back bad dreams of things in the past  . Codeine Hives, Nausea Only and Other (See Comments)    Feels funny  . Latex Hives  . Penicillins Hives  . Polyethylene Glycol Other (See Comments)    Per allergy test  . Sulfonamide  Derivatives Nausea And Vomiting  . Cymbalta [Duloxetine Hcl] Rash  . Remeron [Mirtazapine] Other (See Comments)    Caused lots of strange, weird, crazy dreams.     Current Outpatient Prescriptions  Medication Sig Dispense Refill  . albuterol (PROVENTIL HFA;VENTOLIN HFA) 108 (90 BASE) MCG/ACT inhaler Inhale 2 puffs into the lungs every 6 (six) hours as needed. For shortness of breath      . amLODipine (NORVASC) 5 MG tablet Take 5 mg by mouth daily.      Marland Kitchen atenolol (TENORMIN) 50 MG tablet Take 50-100 mg by mouth 2 (two) times daily. 100 mg in the morning and  50 at night      . BOTOX 200 UNITS SOLR Inject 200 Units into the skin every 3 (three) months.      . clonazePAM (KLONOPIN) 0.5 MG tablet Take one half table bid as needed for anxiety  30 tablet  2  . escitalopram (LEXAPRO) 20 MG tablet Take 1 tablet (20 mg total) by mouth daily.  30 tablet  2  . gabapentin (NEURONTIN) 300 MG capsule Take 1 capsule (300 mg total) by mouth 2 (two) times daily.  60 capsule  2  . HYDROcodone-acetaminophen (NORCO) 7.5-325 MG per tablet Take 1  tablet by mouth every 6 (six) hours as needed for pain.  30 tablet  0  . hydrOXYzine (VISTARIL) 50 MG capsule Take 1 capsule (50 mg total) by mouth at bedtime.  30 capsule  0  . levocetirizine (XYZAL) 5 MG tablet Take 5 mg by mouth every evening.       Marland Kitchen Linaclotide (LINZESS) 145 MCG CAPS capsule One capsule daily on empty stomach.  30 capsule  5  . lovastatin (MEVACOR) 40 MG tablet Take 1 tablet (40 mg total) by mouth at bedtime.  30 tablet  5  . ondansetron (ZOFRAN) 4 MG tablet Take 4 mg by mouth every 8 (eight) hours as needed.       . potassium chloride SA (K-DUR,KLOR-CON) 20 MEQ tablet Take 20 mEq by mouth 2 (two) times daily.      . RABEprazole (ACIPHEX) 20 MG tablet Take 1 tablet (20 mg total) by mouth 2 (two) times daily.  60 tablet  5  . terazosin (HYTRIN) 10 MG capsule Take 10 mg by mouth at bedtime.      . traMADol (ULTRAM) 50 MG tablet Take 50 mg by mouth  every 6 (six) hours as needed. For pain      . zolpidem (AMBIEN) 10 MG tablet Take 1 tablet (10 mg total) by mouth at bedtime as needed for sleep.  30 tablet  3   No current facility-administered medications for this visit.     Past Surgical History  Procedure Laterality Date  . Tonsillectomy    . Abdominal hysterectomy    . Total knee arthroplasty  2002    Right; previous arthroscopic surgery  . Shoulder arthroscopy      Right; bone spurs removed  . Lung biopsy      negative  . 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 Right oophorectomy      benign disease  . Colonoscopy  06/2008, 06/2011    sigmoid tics, tubular adenoma; 2013: anal canal hemorrhoids  . Eus  08/2010    Dr [facility] with EGD. Retained food. No recurrent esophageal lesion, bx negative.  . Colonoscopy  07/10/2011    Anal canal hemorrhoids likely the cause of hematochezia in the setting of constipation; otherwise normal rectum ;submucosal  petechiae in left colon of doubtful clinical significance; otherwise, normal colon  . Esophagogastroduodenoscopy  12/11/2011    [id number] Schatzki's ring; otherwise normal/Small hiatal hernia. Antral and bulbar erosions  . Bravo ph study  12/11/2011    Procedure: BRAVO [place] STUDY;  Surgeon: Daneil Dolin, MD;  Location: [facility];  Service: Endoscopy;  Laterality: N/A;  . Cholecystectomy N/A 04/09/2013    Procedure: LAPAROSCOPIC CHOLECYSTECTOMY;  Surgeon: Jamesetta So, MD;  Location: [facility];  Service: General;  Laterality: N/A;     Allergies  Allergen Reactions  . Elavil [Amitriptyline] Other (See Comments)    Felt really nervous and felt like something was hold her feet or arm and/ or AM headache on it and back on it and went away off it.   . Abilify [Aripiprazole] Other (See Comments)    Dystonic reaction  . Trazodone And Nefazodone Other (See Comments)    Brought back bad dreams of things in the past  . Codeine Hives,  Nausea Only and Other (See Comments)    Feels funny  . Latex Hives  . Penicillins Hives  . Polyethylene Glycol Other (See Comments)    Per allergy test  . Sulfonamide  Derivatives Nausea And Vomiting  . Cymbalta [Duloxetine Hcl] Rash  . Remeron [Mirtazapine] Other (See Comments)    Caused lots of strange, weird, crazy dreams.      Family History  Problem Relation Age of Onset  . Stroke Father   . Alcohol abuse Father   . Heart attack Mother   . Depression Mother   . Anxiety disorder Mother   . Colon cancer Paternal Aunt   . Colon cancer Paternal Uncle   . Dementia Maternal Uncle   . ADD / ADHD Neg Hx   . Bipolar disorder Neg Hx   . Drug abuse Neg Hx   . OCD Neg Hx   . Paranoid behavior Neg Hx   . Schizophrenia Neg Hx   . Seizures Neg Hx   . Sexual abuse Neg Hx   . Physical abuse Neg Hx      Social History [name] reports that she has never smoked. She has never used smokeless tobacco. [name] reports that she does not drink alcohol.   Review of Systems CONSTITUTIONAL: No weight loss, fever, chills, weakness or fatigue.  HEENT: Eyes: No visual loss, blurred vision, double vision or yellow sclerae.No hearing loss, sneezing, congestion, runny nose or sore throat.  SKIN: No rash or itching.  CARDIOVASCULAR: per HPI RESPIRATORY: No shortness of breath, cough or sputum.  GASTROINTESTINAL: No anorexia, nausea, vomiting or diarrhea. No abdominal pain or blood.  GENITOURINARY: No burning on urination, no polyuria NEUROLOGICAL: Dizziness MUSCULOSKELETAL: No muscle, back pain, joint pain or stiffness.  LYMPHATICS: No enlarged nodes. No history of splenectomy.  PSYCHIATRIC: No history of depression or anxiety.  ENDOCRINOLOGIC: No reports of sweating, cold or heat intolerance. No polyuria or polydipsia.  Marland Kitchen   Physical Examination p 55 bp 127/58 Wt 195 lbs BMI 36  Orthostatics: Lying p 56 bp 132/72 Sitting p 55 bp 126/61 Standing p 55 bp 112/60 Gen: resting  comfortably, no acute distress HEENT: no scleral icterus, pupils equal round and reactive, no palptable cervical adenopathy,  CV: RRR, no m/r/g, no JVD, no carotid bruits Resp: Clear to auscultation bilaterally GI: abdomen is soft, non-tender, non-distended, normal bowel sounds, no hepatosplenomegaly MSK: extremities are warm, no edema.  Skin: warm, no rash Neuro:  no focal deficits Psych: appropriate affect   Diagnostic Studies 01/21/13 EKG: sinus brady rate 54, no block, no ischemic changes   02/17/13 Event monitor: baseline sinus brady in 50s, symptoms of dizziness correlate w/ sinus brady in 50s. Occasional PVCs, 1 PVC couplet.   01/2012 Carotid US: minimal plaque at bifurcations, no stenosis   Pertinent labs 12/2012: Na 142 K 3.7 Cl 112 Cr 0.8     Assessment and Plan  1. Dizziness - symptoms most suggestive of vertigo - she is orthostatic by blood pressure today in clinic - will stop her hytrin - I have asked her to discuss vertigo with her PCP and neurologist.  2. Palpitations - negative event monitor - continue atenolol   Follow up 6 monhts   Arnoldo Lenis, M.D., F.A.C.C.

## 2013-08-04 ENCOUNTER — Telehealth: Payer: Self-pay | Admitting: Gastroenterology

## 2013-08-04 MED ORDER — SUCRALFATE 1 G PO TABS: 1.0000 g | ORAL_TABLET | Freq: Three times a day (TID) | ORAL | Status: DC

## 2013-08-04 NOTE — Telephone Encounter (Signed)
Spoke with Pam at [facility]- tablets can be crushed by pt and put into water. She said it wont be as thick as the suspension and she will try to instruct the patient as much as she can.   Tried to call pt- LMOM

## 2013-08-04 NOTE — Telephone Encounter (Signed)
Pt is aware.  [name], please schedule ov in March with RMR

## 2013-08-04 NOTE — Telephone Encounter (Signed)
Please let patient know, Dr. Gala Romney does not recommend pH study or EGD done here. He wants to try her on Carafate. Looks like pills on formulary but suspension is not. Please call [facility] and find out if the pills can be made into a suspension to provide better esophageal coating.  She is supposed to come back and see Dr. Gala Romney first of 08/2013. Schedule now if possible.

## 2013-08-05 ENCOUNTER — Encounter: Payer: Self-pay | Admitting: Internal Medicine

## 2013-08-05 NOTE — Telephone Encounter (Signed)
Pt is aware of OV on 3/17 at 9am with RMR and appt card was mailed

## 2013-08-11 ENCOUNTER — Telehealth (HOSPITAL_COMMUNITY): Payer: Self-pay | Admitting: *Deleted

## 2013-08-12 ENCOUNTER — Telehealth (HOSPITAL_COMMUNITY): Payer: Self-pay | Admitting: *Deleted

## 2013-08-12 NOTE — Telephone Encounter (Signed)
done

## 2013-08-13 ENCOUNTER — Telehealth (HOSPITAL_COMMUNITY): Payer: Self-pay | Admitting: *Deleted

## 2013-08-13 NOTE — Telephone Encounter (Signed)
done

## 2013-08-17 ENCOUNTER — Ambulatory Visit (HOSPITAL_COMMUNITY): Payer: Self-pay | Admitting: Psychiatry

## 2013-08-23 ENCOUNTER — Ambulatory Visit (INDEPENDENT_AMBULATORY_CARE_PROVIDER_SITE_OTHER): Payer: Managed Care, Other (non HMO) | Admitting: Psychiatry

## 2013-08-23 DIAGNOSIS — F330 Major depressive disorder, recurrent, mild: Secondary | ICD-10-CM

## 2013-08-23 NOTE — Progress Notes (Signed)
Patient:  [name]   DOB: 10-25-1945  MR Number: [id number]  Location: [place]:  [address] [place],  [place], [address]  Start: Monday 09/02/2013 10:00 AM End: Monday 09/02/2013 10:50 AM  Provider/Observer:     Maurice Small, MSW, LCSW   Chief Complaint:      Chief Complaint  Patient presents with  . Anxiety  . Depression    Reason For Service:     The patient is a returning patient to this clinician and is resuming services per her psychiatrist Dr.  Harrington Challenger' recommendation. Patient has a long-standing history of recurrent periods of depression. She is experiencing increased anxiety and depressed mood which appears to have been triggered by the recent  deaths of four relatives.  Interventions Strategy:  Supportive therapy, cognitive behavioral therapy  Participation Level:   Active  Participation Quality:  Appropriate      Behavioral Observation:  Casual, Alert, and Tearful.   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  Patient Progress:   Patient reports increased nervousness and sadness since the recent deaths 04 family members. She also reports stress and anxiety related to her best friend of 14 years moving to [facility] tomorrow. Patient has increased thoughts about previous losses.  Shealso reports worry about who will take care of her should she become ill and need assistance. Therapist works with patient to process feelings and to identify her thinking patterns and effects on mood and behavior. Therapist also works with patient to discuss possible resources as well as identify coping statements. Therapist and patient also discuss possible  ways to maintain contact with her friend who was moving. Therapist recommends that patient returns for follow up appointment in 3-4 weeks. However patient is worried about copayment but says she will attend next session in about 8 weeks but will call should she need services for that time.  Target Goals:   Reduce anxiety  Last Reviewed:     Goals Addressed Today:    Reduce anxiety  Impression/Diagnosis:   The patient has a long-standing history of recurrent periods of depression. Symptoms have worsened in recent weeks which appears to have been triggered by several losses in patient's life in the past few weeks. She currently is experiencing increased anxiety and depressed mood along with grief and loss issues. Diagnoses major depressive disorder, recurrent  Diagnosis:  Axis I: Major depressive disorder, recurrent episode, mild          Axis II: Deferred

## 2013-08-23 NOTE — Patient Instructions (Signed)
Discussed orally 

## 2013-08-26 ENCOUNTER — Ambulatory Visit (HOSPITAL_COMMUNITY): Payer: Self-pay | Admitting: Psychiatry

## 2013-09-06 ENCOUNTER — Other Ambulatory Visit (HOSPITAL_COMMUNITY): Payer: Self-pay | Admitting: Psychiatry

## 2013-09-07 ENCOUNTER — Encounter (INDEPENDENT_AMBULATORY_CARE_PROVIDER_SITE_OTHER): Payer: Self-pay

## 2013-09-07 ENCOUNTER — Ambulatory Visit (INDEPENDENT_AMBULATORY_CARE_PROVIDER_SITE_OTHER): Payer: Managed Care, Other (non HMO) | Admitting: Internal Medicine

## 2013-09-07 ENCOUNTER — Encounter: Payer: Self-pay | Admitting: Internal Medicine

## 2013-09-07 VITALS — BP 134/78 | HR 58 | Temp 98.3°F | Wt 193.4 lb

## 2013-09-07 DIAGNOSIS — K219 Gastro-esophageal reflux disease without esophagitis: Secondary | ICD-10-CM

## 2013-09-07 DIAGNOSIS — K648 Other hemorrhoids: Secondary | ICD-10-CM

## 2013-09-07 DIAGNOSIS — K5900 Constipation, unspecified: Secondary | ICD-10-CM

## 2013-09-07 NOTE — Progress Notes (Signed)
Primary Care Physician:  Rosita Fire, MD Primary Gastroenterologist:  Dr. Gala Romney  Pre-Procedure History & Physical: HPI:  [name] is a 68 y.o. female here for followup of GERD. Off acid suppression therapy for now; taking Carafate suspension. Reflux symptoms not well controlled. No dysphagia. Complains of ongoing constipation (Linzess 145 has helped). Has intermittent low volume paper hematochezia, rectal burning itching and discharge. Wants hemorrhoid banding.  No dysphagia. Has a history of granular cell tumor of the esophagus. Has seen Dr. Newman Pies at [facility] since that diagnosis. Offered GES and EGD there previously but patient declined. Patient did not get LFTs done. Past Medical History  Diagnosis Date  . Hypertension   . Hyperlipidemia   . Gastroesophageal reflux disease   . Diverticulosis   . Anxiety and depression   . Adenomatous colon polyp 2006    excised in 2006 & 2010Due surveillance 06/2013  . Chronic back pain   . Thyroid nodule   . Fibromyalgia   . Migraines   . Insomnia   . Chronic constipation   . Asthma   . History of benign esophageal tumor 2010    granular cell esophageal tumor (Dx 06/2008), resected via EMR 2011, due repeat EGD 02/2012  . Chest discomfort   . Hiatal hernia   . Depression   . Anxiety   . Sleep apnea     Stop Bang score of 5. Pt said she was told by Dr. Merlene Laughter that she had "a little bit" of sleep apena, but not bad enough to treat.    Past Surgical History  Procedure Laterality Date  . Tonsillectomy    . Abdominal hysterectomy    . Total knee arthroplasty  2002    Right; previous arthroscopic surgery  . Shoulder arthroscopy      Right; bone spurs removed  . Lung biopsy      negative  . 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 Right oophorectomy      benign disease  . Colonoscopy  06/2008, 06/2011    sigmoid tics, tubular adenoma; 2013: anal canal hemorrhoids  . Eus  08/2010    Dr  [facility] with EGD. Retained food. No recurrent esophageal lesion, bx negative.  . Colonoscopy  07/10/2011    Anal canal hemorrhoids likely the cause of hematochezia in the setting of constipation; otherwise normal rectum ;submucosal  petechiae in left colon of doubtful clinical significance; otherwise, normal colon  . Esophagogastroduodenoscopy  12/11/2011    [id number] Schatzki's ring; otherwise normal/Small hiatal hernia. Antral and bulbar erosions  . Bravo ph study  12/11/2011    Procedure: BRAVO [place] STUDY;  Surgeon: Daneil Dolin, MD;  Location: [facility];  Service: Endoscopy;  Laterality: N/A;  . Cholecystectomy N/A 04/09/2013    Procedure: LAPAROSCOPIC CHOLECYSTECTOMY;  Surgeon: Jamesetta So, MD;  Location: [facility];  Service: General;  Laterality: N/A;    Prior to Admission medications   Medication Sig Start Date End Date Taking? Authorizing Provider  albuterol (PROVENTIL HFA;VENTOLIN HFA) 108 (90 BASE) MCG/ACT inhaler Inhale 2 puffs into the lungs every 6 (six) hours as needed. For shortness of breath   Yes Historical Provider, MD  amLODipine (NORVASC) 5 MG tablet Take 10 mg by mouth daily.  01/21/13  Yes Yehuda Savannah, MD  clonazePAM Bobbye Charleston) 0.5 MG tablet Take one half table bid as needed for anxiety 07/22/13  Yes Levonne Spiller, MD  escitalopram (LEXAPRO) 20 MG tablet Take 1 tablet (20  mg total) by mouth daily. 07/22/13 07/22/14 Yes Levonne Spiller, MD  gabapentin (NEURONTIN) 300 MG capsule Take 1 capsule (300 mg total) by mouth 2 (two) times daily. 07/22/13  Yes Levonne Spiller, MD  hydrOXYzine (VISTARIL) 50 MG capsule Take 1 capsule (50 mg total) by mouth at bedtime. 07/22/13  Yes Levonne Spiller, MD  levocetirizine (XYZAL) 5 MG tablet Take 5 mg by mouth every evening.  07/07/13  Yes Historical Provider, MD  Linaclotide Rolan Lipa) 145 MCG CAPS capsule One capsule daily on empty stomach. 07/15/13  Yes Mahala Menghini, PA-C  lovastatin (MEVACOR) 40 MG tablet Take 1 tablet (40 mg total) by  mouth at bedtime. 05/11/13  Yes Arnoldo Lenis, MD  ondansetron (ZOFRAN) 4 MG tablet Take 4 mg by mouth every 8 (eight) hours as needed.  02/27/13  Yes Historical Provider, MD  potassium chloride SA (K-DUR,KLOR-CON) 20 MEQ tablet Take 20 mEq by mouth 2 (two) times daily.   Yes Historical Provider, MD  sucralfate (CARAFATE) 1 G tablet Take 1 tablet (1 g total) by mouth 4 (four) times daily -  with meals and at bedtime. As needed for "heartburn" and upper abdominal pain. 08/04/13  Yes Mahala Menghini, PA-C  traMADol (ULTRAM) 50 MG tablet Take 50 mg by mouth every 6 (six) hours as needed. For pain   Yes Historical Provider, MD  zolpidem (AMBIEN) 10 MG tablet Take 1 tablet (10 mg total) by mouth at bedtime as needed for sleep. 07/22/13  Yes Levonne Spiller, MD  atenolol (TENORMIN) 50 MG tablet Take 50-100 mg by mouth 2 (two) times daily. 100 mg in the morning and  50 at night    Historical Provider, MD  BOTOX 200 UNITS SOLR Inject 200 Units into the skin every 3 (three) months. 11/11/12   Historical Provider, MD  HYDROcodone-acetaminophen (NORCO) 7.5-325 MG per tablet Take 1 tablet by mouth every 6 (six) hours as needed for pain. 04/09/13   Jamesetta So, MD  RABEprazole (ACIPHEX) 20 MG tablet Take 1 tablet (20 mg total) by mouth 2 (two) times daily. 12/08/12 12/08/13  Mahala Menghini, PA-C    Allergies as of 09/07/2013 - Review Complete 09/07/2013  Allergen Reaction Noted  . Elavil [amitriptyline] Other (See Comments) 06/03/2012  . Abilify [aripiprazole] Other (See Comments) 11/25/2012  . Trazodone and nefazodone Other (See Comments) 08/31/2012  . Codeine Hives, Nausea Only, and Other (See Comments)   . Latex Hives 11/21/2011  . Penicillin v Itching 08/02/2013  . Penicillins Hives   . Polyethylene glycol Other (See Comments) 12/26/2012  . Sulfonamide derivatives Nausea And Vomiting   . Cymbalta [duloxetine hcl] Rash 03/10/2013  . Remeron [mirtazapine] Other (See Comments) 05/06/2012    Family History   Problem Relation Age of Onset  . Stroke Father   . Alcohol abuse Father   . Heart attack Mother   . Depression Mother   . Anxiety disorder Mother   . Colon cancer Paternal Aunt   . Colon cancer Paternal Uncle   . Dementia Maternal Uncle   . ADD / ADHD Neg Hx   . Bipolar disorder Neg Hx   . Drug abuse Neg Hx   . OCD Neg Hx   . Paranoid behavior Neg Hx   . Schizophrenia Neg Hx   . Seizures Neg Hx   . Sexual abuse Neg Hx   . Physical abuse Neg Hx     History   Social History  . Marital Status: Single    Spouse Name:  N/A    Number of Children: 1  . Years of Education: N/A   Occupational History  . disabled    Social History Main Topics  . Smoking status: Never Smoker   . Smokeless tobacco: Never Used  . Alcohol Use: No  . Drug Use: No  . Sexual Activity: No   Other Topics Concern  . Not on file   Social History Narrative  . No narrative on file    Review of Systems: See HPI, otherwise negative ROS  Physical Exam: BP 134/78  Pulse 58  Temp(Src) 98.3 F (36.8 C) (Oral)  Wt 193 lb 6.4 oz (87.726 kg) General:   Alert,  Well-developed, obese well-nourished, pleasant and cooperative in NAD Skin:  Intact without significant lesions or rashes. Eyes:  Sclera clear, no icterus.   Conjunctiva pink. Ears:  Normal auditory acuity. Nose:  No deformity, discharge,  or lesions. Mouth:  No deformity or lesions. Neck:  Supple; no masses or thyromegaly. No significant cervical adenopathy. Lungs:  Clear throughout to auscultation.   No wheezes, crackles, or rhonchi. No acute distress. Heart:  Regular rate and rhythm; no murmurs, clicks, rubs,  or gallops. Abdomen: Obese. Positive bowel sounds soft minimal epigastric tenderness. The mass or organomegaly Pulses:  Normal pulses noted. Extremities:  Without clubbing or edema.   Impression:  68 year old obese lady long-standing GERD. History of granular cell tumor of the esophagus-likely not an issue these days. Off acid  suppression therapy. Carafate making some difference, however. No alarm symptoms. Constipation notably improved still not optimized on low-dose linzess. She wants hemorrhoids banded. She has a current colonoscopy on file. She does have hemorrhoids.    Recommendations:  Weight loss recommended  GERD information  Try linzess 290 daily - can alternate 298 with 145 every other day if 290 daily too much.  3 week trial of Dexilant 60 mg daily  Continue Caraate suspension 4x daily  OV with [place] in 4 months- Hopefully, constipation treatment will be optimized and we could likely perform banding at that time. That procedure has been discussed today.  Telephone progress report in 3 weeks

## 2013-09-07 NOTE — Patient Instructions (Addendum)
Weight loss recommended  GERD information  Try linzess 290 daily  3 week trial of Dexilant 60 mg daily  Continue Caraate suspension 4x daily  OV with [place] in 4 months  Telephone progress report in 3 weeks

## 2013-09-28 ENCOUNTER — Other Ambulatory Visit: Payer: Self-pay | Admitting: Cardiology

## 2013-09-29 ENCOUNTER — Other Ambulatory Visit: Payer: Self-pay | Admitting: *Deleted

## 2013-09-29 MED ORDER — LOVASTATIN 40 MG PO TABS: 40.0000 mg | ORAL_TABLET | Freq: Every day | ORAL | Status: DC

## 2013-10-12 ENCOUNTER — Ambulatory Visit (INDEPENDENT_AMBULATORY_CARE_PROVIDER_SITE_OTHER): Payer: Medicare HMO | Admitting: Orthopedic Surgery

## 2013-10-12 ENCOUNTER — Encounter: Payer: Self-pay | Admitting: Orthopedic Surgery

## 2013-10-12 VITALS — BP 153/78 | Ht 62.0 in | Wt 192.0 lb

## 2013-10-12 DIAGNOSIS — M6530 Trigger finger, unspecified finger: Secondary | ICD-10-CM | POA: Insufficient documentation

## 2013-10-12 NOTE — Patient Instructions (Signed)

## 2013-10-12 NOTE — Progress Notes (Signed)
[name], female   DOB: 1946-01-28, 68 y.o.   MRN: [id number]  Chief Complaint  Patient presents with  . Hand Pain    Left thumb locking and jumping out of place. Referred by Dr. Legrand Rams    HISTORY: Triggering left thumb last treated 6 months ago started again back in March of this year treated with oral prednisone no improvement locking catching left thumb no trauma pain is 10 out of 10 over the A1 pulley  History of blurred vision eye pain and watering of the eyes chest pain shortness of breath wheezing heartburn nausea constipation amongst other things  Past Medical History  Diagnosis Date  . Hypertension   . Hyperlipidemia   . Gastroesophageal reflux disease   . Diverticulosis   . Anxiety and depression   . Adenomatous colon polyp 2006    excised in 2006 & 2010Due surveillance 06/2013  . Chronic back pain   . Thyroid nodule   . Fibromyalgia   . Migraines   . Insomnia   . Chronic constipation   . Asthma   . History of benign esophageal tumor 2010    granular cell esophageal tumor (Dx 06/2008), resected via EMR 2011, due repeat EGD 02/2012  . Chest discomfort   . Hiatal hernia   . Depression   . Anxiety   . Sleep apnea     Stop Bang score of 5. Pt said she was told by Dr. Merlene Laughter that she had "a little bit" of sleep apena, but not bad enough to treat.    Vital signs: BP 153/78  Ht 5\' 2"  (1.575 m)  Wt 192 lb (87.091 kg)  BMI 35.11 kg/m2   General the patient is well-developed and well-nourished grooming and hygiene are normal Oriented x3 Mood and affect normal Ambulation normal  Inspection of the left thumb shows tenderness and swelling over the A1 pulley with locking catching despite full range of motion at DIP joint. No instability. Flexion power is normal. Full range of motion All joints are stable Motor exam is normal Skin clean dry and intact  Cardiovascular exam is normal Sensory exam normal   Encounter Diagnosis  Name Primary?  Marland Kitchen  Acquired trigger finger Yes    Procedure note trigger finger injection  Diagnosis trigger finger Postop diagnosis trigger finger Procedure injection of trigger finger Finger injected left thumb Details of procedure: After verbal consent and timeout to confirm site left thumb injected with 1 cc of 40 mg of Depo-Medrol and 1 cc of 1% lidocaine  The procedure was tolerated well without complication

## 2013-10-13 ENCOUNTER — Other Ambulatory Visit (HOSPITAL_COMMUNITY): Payer: Self-pay | Admitting: Internal Medicine

## 2013-10-13 DIAGNOSIS — Z1231 Encounter for screening mammogram for malignant neoplasm of breast: Secondary | ICD-10-CM

## 2013-10-18 ENCOUNTER — Ambulatory Visit (INDEPENDENT_AMBULATORY_CARE_PROVIDER_SITE_OTHER): Payer: Managed Care, Other (non HMO) | Admitting: Psychiatry

## 2013-10-18 DIAGNOSIS — F330 Major depressive disorder, recurrent, mild: Secondary | ICD-10-CM

## 2013-10-18 NOTE — Patient Instructions (Signed)
Discussed orally 

## 2013-10-18 NOTE — Progress Notes (Signed)
   THERAPIST PROGRESS NOTE  Session Time: Monday 10/18/2013 11:05 AM - 11:40 AM  Participation Level: Active  Behavioral Response: CasualAlertAnxious/sad  Type of Therapy: Individual Therapy  Treatment Goals addressed: Reduce anxiety  Interventions: CBT and Supportive  Summary: [name] is a 68 y.o. female who is a returning patient to this clinician and is resuming services per her psychiatrist Dr. Harrington Challenger' recommendation. Patient has a long-standing history of recurrent periods of depression. She is experiencing decreased anxiety and depressed mood which appears to have been triggered by the recent deaths of four relatives as well as a close friend moving away. Since last session 7 weeks ago, patient reports decreased anxiety but continued sadness. She says she hasn't been dwelling on the loss of her relatives but expresses acceptance they are gone and will not be back. She is tearful as she discusses her friend moving away and shares memories of their friendship. She has had no contact with friend since the move but plans to write friend a letter. She has concerns about her future health and welfare but has not been dwelling on this. She was sick three weeks ago and reports her daughter was very helpful.   Suicidal/Homicidal: No  Therapist Response: Therapist works with patient to process her feelings, identify coping techniques and coping statements, identify ways to expand support system and nurture current relationships.  Plan: Patient reports feeling better and able to manage issues without therapy. Therefore, psychotherapy services will be discontinued at this time. Patient is encouraged to contact this practice should she decide to pursue services in the future. Patient will continue to see psychiatrist Dr. Harrington Challenger for medication management..  Diagnosis: Axis I: MDD    Axis II: Deferred    BYNUM,PEGGY, LCSW 10/18/2013

## 2013-10-28 ENCOUNTER — Other Ambulatory Visit (HOSPITAL_COMMUNITY): Payer: Self-pay | Admitting: Psychiatry

## 2013-10-28 ENCOUNTER — Ambulatory Visit (INDEPENDENT_AMBULATORY_CARE_PROVIDER_SITE_OTHER): Payer: Medicare HMO | Admitting: Orthopedic Surgery

## 2013-10-28 VITALS — BP 153/85 | Ht 62.0 in | Wt 198.0 lb

## 2013-10-28 DIAGNOSIS — M6530 Trigger finger, unspecified finger: Secondary | ICD-10-CM

## 2013-10-28 MED ORDER — OXYCODONE-ACETAMINOPHEN 5-325 MG PO TABS: 1.0000 | ORAL_TABLET | ORAL | Status: AC | PRN

## 2013-10-28 NOTE — Progress Notes (Signed)
[name], female   DOB: 04-27-1946, 68 y.o.   MRN: [id number]  Chief Complaint  Patient presents with  . Follow-up    2 week recheck trigger finger left thumb s/p injection    The patient reports improvement with catching and locking but still having some clicking  Repeat injection left thumb Procedure note trigger finger injection Procedure note trigger thumb injection  Diagnosis trigger finger Postop diagnosis trigger finger Procedure injection of trigger finger Finger injected left thumb Details of procedure: After verbal consent and timeout to confirm site the RIGHT thumb was injected with 1 cc of 40 mg of Depo-Medrol and 1 cc of 1% lidocaine  The procedure was tolerated well without complication

## 2013-10-29 ENCOUNTER — Ambulatory Visit (INDEPENDENT_AMBULATORY_CARE_PROVIDER_SITE_OTHER): Payer: Managed Care, Other (non HMO) | Admitting: Psychiatry

## 2013-10-29 ENCOUNTER — Encounter (HOSPITAL_COMMUNITY): Payer: Self-pay | Admitting: Psychiatry

## 2013-10-29 VITALS — BP 140/90 | Ht 62.0 in | Wt 198.0 lb

## 2013-10-29 DIAGNOSIS — F5105 Insomnia due to other mental disorder: Secondary | ICD-10-CM

## 2013-10-29 DIAGNOSIS — F330 Major depressive disorder, recurrent, mild: Secondary | ICD-10-CM

## 2013-10-29 DIAGNOSIS — F329 Major depressive disorder, single episode, unspecified: Secondary | ICD-10-CM

## 2013-10-29 MED ORDER — CLONAZEPAM 0.5 MG PO TABS: ORAL_TABLET | ORAL | Status: DC

## 2013-10-29 MED ORDER — ZOLPIDEM TARTRATE 10 MG PO TABS: 10.0000 mg | ORAL_TABLET | Freq: Every evening | ORAL | Status: DC | PRN

## 2013-10-29 MED ORDER — ESCITALOPRAM OXALATE 20 MG PO TABS: 20.0000 mg | ORAL_TABLET | Freq: Every day | ORAL | Status: DC

## 2013-10-29 MED ORDER — GABAPENTIN 300 MG PO CAPS
300.0000 mg | ORAL_CAPSULE | Freq: Two times a day (BID) | ORAL | Status: DC
Start: 2013-10-29 — End: 2014-02-22

## 2013-10-29 NOTE — Progress Notes (Signed)
[name], female   DOB: 12-11-1945, 68 y.o.   MRN: [id number] [name], female   DOB: Nov 15, 1945, 68 y.o.   MRN: [id number] [name], female   DOB: 1946/05/29, 68 y.o.   MRN: [id number] [name], female   DOB: 06-08-1946, 68 y.o.   MRN: [id number] [facility] [id number] Progress Note [name] MRN: [id number] DOB: [birth year] Age: 68 y.o.  Date: 10/29/2013 Start Time: 9:45 AM End Time: 10:10 AM  Chief Complaint: Chief Complaint  Patient presents with  . Anxiety  . Depression  . Follow-up    Subjective: "I'm doing a little better."  This patient is a 68 year old black female who lives with her 62 year old daughter who has mild mental retardation. She lives in [place]. She is a retired [employer] manager. The patient states that she's been depressed "all my life.". She can't give any specific reasons for the depression but notes that at age 89 she was already taking medication to help with sleep. In her 86s her family doctor diagnosed with schizophrenia although she had no symptoms of paranoia or delusions or hallucinations. She was given medication for this.  Since 1997 she's been coming here with symptoms of depression. She's also had significant problems with fibromyalgia and fatigue. In Neurontin and Ambien have helped. Lately however she's become more depressed and worried. She has had gallbladder surgery and she's concerned about how is going to go. For some reason she's been thinking a lot about death. She doesn't have anyone to stay with her daughter something goes wrong. She's been more negative but is densely not suicidal but her energy has dropped. In general she is a very functional person  The patient returns after 4 months. She's doing a bit better. Her mood has improved. She's watching a 68 year old girl after school and her daughter seems to be enjoying the company. She's sleeping well with Ambien. She denies  suicidal ideation.  Current psychiatric medication Neurontin 300 mg twice a day Ambien 10 mg at bedtime Lexapro 10 mg every morning  Vitals: BP 140/90  Ht 5\' 2"  (1.575 m)  Wt 198 lb (89.812 kg)  BMI 36.21 kg/m2  Psychiatric history Patient has a long history of depression.  She has been seeing in this office since May 1997.  Patient denies any history of suicidal attempt however endorse chronic depression and anxiety.  In the past she has taken Prozac but did not like after taking few doses, she also has taken Seroquel, Abilify, Xanax, Pamelor, and amitriptyline.  Patient denies a history of paranoia or delusions however there are times when she has been very depressed and have a lot of negative symptoms.  Alcohol and substance use history Patient denies any history of alcohol or substance use.  Allergies: Allergies  Allergen Reactions  . Elavil [Amitriptyline] Other (See Comments)    Felt really nervous and felt like something was hold her feet or arm and/ or AM headache on it and back on it and went away off it.   . Abilify [Aripiprazole] Other (See Comments)    Dystonic reaction  . Trazodone And Nefazodone Other (See Comments)    Brought back bad dreams of things in the past  . Codeine Hives, Nausea Only and Other (See Comments)    Feels funny  . Latex Hives  . Penicillin V Itching  . Penicillins Hives  . Polyethylene Glycol Other (See Comments)    Per allergy test  .  Sulfonamide Derivatives Nausea And Vomiting  . Cymbalta [Duloxetine Hcl] Rash  . Remeron [Mirtazapine] Other (See Comments)    Caused lots of strange, weird, crazy dreams.   Medical History: Past Medical History  Diagnosis Date  . Hypertension   . Hyperlipidemia   . Gastroesophageal reflux disease   . Diverticulosis   . Anxiety and depression   . Adenomatous colon polyp 2006    excised in 2006 & 2010Due surveillance 06/2013  . Chronic back pain   . Thyroid nodule   . Fibromyalgia   . Migraines   .  Insomnia   . Chronic constipation   . Asthma   . History of benign esophageal tumor 2010    granular cell esophageal tumor (Dx 06/2008), resected via EMR 2011, due repeat EGD 02/2012  . Chest discomfort   . Hiatal hernia   . Depression   . Anxiety   . Sleep apnea     Stop Bang score of 5. Pt said she was told by Dr. Merlene Laughter that she had "a little bit" of sleep apena, but not bad enough to treat.  Patient has history of hypertension, hyperlipidemia, GERD, diverticulosis, colonic polyp, chronic back pain, fibromyalgia, chronic constipation, benign tumor of esophagus and chronic headache.  Her primary care physician is Dr. Legrand Rams, she sees Dr. Paulita Cradle for chronic pain, fibromyalgia and headache. Her cardiologist is Dr Elvera Maria. Surgical History: Past Surgical History  Procedure Laterality Date  . Tonsillectomy    . Abdominal hysterectomy    . Total knee arthroplasty  2002    Right; previous arthroscopic surgery  . Shoulder arthroscopy      Right; bone spurs removed  . Lung biopsy      negative  . 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 Right oophorectomy      benign disease  . Colonoscopy  06/2008, 06/2011    sigmoid tics, tubular adenoma; 2013: anal canal hemorrhoids  . Eus  08/2010    Dr [facility] with EGD. Retained food. No recurrent esophageal lesion, bx negative.  . Colonoscopy  07/10/2011    Anal canal hemorrhoids likely the cause of hematochezia in the setting of constipation; otherwise normal rectum ;submucosal  petechiae in left colon of doubtful clinical significance; otherwise, normal colon  . Esophagogastroduodenoscopy  12/11/2011    [id number] Schatzki's ring; otherwise normal/Small hiatal hernia. Antral and bulbar erosions  . Bravo ph study  12/11/2011    Procedure: BRAVO [place]ing STUDY;  Surgeon: Daneil Dolin, MD;  Location: [facility] SUITE;  Service: Endoscopy;  Laterality: N/A;  . Cholecystectomy N/A 04/09/2013    Procedure:  LAPAROSCOPIC CHOLECYSTECTOMY;  Surgeon: Jamesetta So, MD;  Location: [facility] ORS;  Service: General;  Laterality: N/A;   Family History: family history includes Alcohol abuse in her father; Anxiety disorder in her mother; Colon cancer in her paternal aunt and paternal uncle; Dementia in her maternal uncle; Depression in her mother; Heart attack in her mother; Stroke in her father. There is no history of ADD / ADHD, Bipolar disorder, Drug abuse, OCD, Paranoid behavior, Schizophrenia, Seizures, Sexual abuse, or Physical abuse. Reviewed and nothing new today again.  Mental status examination Patient is casually dressed and fairly groomed. She is calm cooperative and maintained fair eye contact. She described her mood as good and her affect is mood appropriate. She still has chronic pain  Her speech is coherent but slow.  Her thought processes clear and logical. Shedenies suicidal ideation but  no plans, no active or passive homicidal thoughts. She denies any auditory or visual hallucination. There no psychotic symptoms present at this time. She's alert and oriented x3. Her insight judgment impulse control is okay  Lab Results:  Results for orders placed during the hospital encounter of 04/05/13 (from the past 8736 hour(s))  HEPATIC FUNCTION PANEL   Collection Time    04/05/13  1:15 PM      Result Value Ref Range   Total Protein 6.4  6.0 - 8.3 g/dL   Albumin 3.8  3.5 - 5.2 g/dL   AST 14  0 - 37 U/L   ALT 12  0 - 35 U/L   Alkaline Phosphatase 70  39 - 117 U/L   Total Bilirubin 0.5  0.3 - 1.2 mg/dL   Bilirubin, Direct 0.1  0.0 - 0.3 mg/dL   Indirect Bilirubin 0.4  0.3 - 0.9 mg/dL  BASIC METABOLIC PANEL   Collection Time    04/05/13  1:15 PM      Result Value Ref Range   Sodium 143  135 - 145 mEq/L   Potassium 4.4  3.5 - 5.1 mEq/L   Chloride 105  96 - 112 mEq/L   CO2 28  19 - 32 mEq/L   Glucose, Bld 93  70 - 99 mg/dL   BUN 14  6 - 23 mg/dL   Creatinine, Ser 0.95  0.50 - 1.10 mg/dL   Calcium  10.7 (*) 8.4 - 10.5 mg/dL   GFR calc non Af Amer 61 (*) >90 mL/min   GFR calc Af Amer 70 (*) >90 mL/min  CBC WITH DIFFERENTIAL   Collection Time    04/05/13  1:15 PM      Result Value Ref Range   WBC 4.8  4.0 - 10.5 K/uL   RBC 4.92  3.87 - 5.11 MIL/uL   Hemoglobin 13.2  12.0 - 15.0 g/dL   HCT 40.9  36.0 - 46.0 %   MCV 83.1  78.0 - 100.0 fL   MCH 26.8  26.0 - 34.0 pg   MCHC 32.3  30.0 - 36.0 g/dL   RDW 15.8 (*) 11.5 - 15.5 %   Platelets 175  150 - 400 K/uL   Neutrophils Relative % 59  43 - 77 %   Neutro Abs 2.8  1.7 - 7.7 K/uL   Lymphocytes Relative 32  12 - 46 %   Lymphs Abs 1.5  0.7 - 4.0 K/uL   Monocytes Relative 7  3 - 12 %   Monocytes Absolute 0.3  0.1 - 1.0 K/uL   Eosinophils Relative 2  0 - 5 %   Eosinophils Absolute 0.1  0.0 - 0.7 K/uL   Basophils Relative 0  0 - 1 %   Basophils Absolute 0.0  0.0 - 0.1 K/uL  Results for orders placed during the hospital encounter of 12/26/12 (from the past 8736 hour(s))  CBC WITH DIFFERENTIAL   Collection Time    12/26/12  6:49 AM      Result Value Ref Range   WBC 4.9  4.0 - 10.5 K/uL   RBC 4.74  3.87 - 5.11 MIL/uL   Hemoglobin 12.6  12.0 - 15.0 g/dL   HCT 38.8  36.0 - 46.0 %   MCV 81.9  78.0 - 100.0 fL   MCH 26.6  26.0 - 34.0 pg   MCHC 32.5  30.0 - 36.0 g/dL   RDW 15.2  11.5 - 15.5 %   Platelets 125 (*) 150 -  400 K/uL   Neutrophils Relative % 91 (*) 43 - 77 %   Neutro Abs 4.4  1.7 - 7.7 K/uL   Lymphocytes Relative 7 (*) 12 - 46 %   Lymphs Abs 0.3 (*) 0.7 - 4.0 K/uL   Monocytes Relative 1 (*) 3 - 12 %   Monocytes Absolute 0.1  0.1 - 1.0 K/uL   Eosinophils Relative 0  0 - 5 %   Eosinophils Absolute 0.0  0.0 - 0.7 K/uL   Basophils Relative 0  0 - 1 %   Basophils Absolute 0.0  0.0 - 0.1 K/uL  BASIC METABOLIC PANEL   Collection Time    12/26/12  6:49 AM      Result Value Ref Range   Sodium 139  135 - 145 mEq/L   Potassium 3.2 (*) 3.5 - 5.1 mEq/L   Chloride 104  96 - 112 mEq/L   CO2 27  19 - 32 mEq/L   Glucose, Bld 137 (*)  70 - 99 mg/dL   BUN 16  6 - 23 mg/dL   Creatinine, Ser 0.89  0.50 - 1.10 mg/dL   Calcium 10.8 (*) 8.4 - 10.5 mg/dL   GFR calc non Af Amer 66 (*) >90 mL/min   GFR calc Af Amer 77 (*) >90 mL/min  TROPONIN I   Collection Time    12/26/12  6:49 AM      Result Value Ref Range   Troponin I <0.30  <0.30 ng/mL  LIPASE, BLOOD   Collection Time    12/26/12  7:43 AM      Result Value Ref Range   Lipase 47  11 - 59 U/L  HEPATIC FUNCTION PANEL   Collection Time    12/26/12  7:43 AM      Result Value Ref Range   Total Protein 6.5  6.0 - 8.3 g/dL   Albumin 3.8  3.5 - 5.2 g/dL   AST 247 (*) 0 - 37 U/L   ALT 121 (*) 0 - 35 U/L   Alkaline Phosphatase 90  39 - 117 U/L   Total Bilirubin 0.8  0.3 - 1.2 mg/dL   Bilirubin, Direct 0.4 (*) 0.0 - 0.3 mg/dL   Indirect Bilirubin 0.4  0.3 - 0.9 mg/dL  D-DIMER, QUANTITATIVE   Collection Time    12/26/12  7:43 AM      Result Value Ref Range   D-Dimer, Quant 0.31  0.00 - 0.48 ug/mL-FEU  URINE CULTURE   Collection Time    12/26/12  8:41 AM      Result Value Ref Range   Specimen Description URINE, CLEAN CATCH     Special Requests NONE     Culture  Setup Time 12/26/2012 19:29     Colony Count 7,000 COLONIES/ML     Culture INSIGNIFICANT GROWTH     Report Status 12/27/2012 FINAL    URINALYSIS, ROUTINE W REFLEX MICROSCOPIC   Collection Time    12/26/12  8:41 AM      Result Value Ref Range   Color, Urine YELLOW  YELLOW   APPearance CLEAR  CLEAR   Specific Gravity, Urine >1.030 (*) 1.005 - 1.030   pH 5.5  5.0 - 8.0   Glucose, UA NEGATIVE  NEGATIVE mg/dL   Hgb urine dipstick NEGATIVE  NEGATIVE   Bilirubin Urine NEGATIVE  NEGATIVE   Ketones, ur NEGATIVE  NEGATIVE mg/dL   Protein, ur NEGATIVE  NEGATIVE mg/dL   Urobilinogen, UA 0.2  0.0 - 1.0 mg/dL  Nitrite NEGATIVE  NEGATIVE   Leukocytes, UA TRACE (*) NEGATIVE  URINE MICROSCOPIC-ADD ON   Collection Time    12/26/12  8:41 AM      Result Value Ref Range   Squamous Epithelial / LPF RARE  RARE    WBC, UA 3-6  <3 WBC/hpf   RBC / HPF 0-2  <3 RBC/hpf   Bacteria, UA RARE  RARE   Urine-Other MUCOUS PRESENT    TROPONIN I   Collection Time    12/26/12 12:30 PM      Result Value Ref Range   Troponin I <0.30  <0.30 ng/mL  TROPONIN I   Collection Time    12/26/12  5:25 PM      Result Value Ref Range   Troponin I <0.30  <0.30 ng/mL  COMPREHENSIVE METABOLIC PANEL   Collection Time    12/27/12  6:03 AM      Result Value Ref Range   Sodium 142  135 - 145 mEq/L   Potassium 3.7  3.5 - 5.1 mEq/L   Chloride 112  96 - 112 mEq/L   CO2 24  19 - 32 mEq/L   Glucose, Bld 104 (*) 70 - 99 mg/dL   BUN 12  6 - 23 mg/dL   Creatinine, Ser 0.81  0.50 - 1.10 mg/dL   Calcium 9.8  8.4 - 10.5 mg/dL   Total Protein 6.0  6.0 - 8.3 g/dL   Albumin 3.2 (*) 3.5 - 5.2 g/dL   AST 211 (*) 0 - 37 U/L   ALT 263 (*) 0 - 35 U/L   Alkaline Phosphatase 96  39 - 117 U/L   Total Bilirubin 0.8  0.3 - 1.2 mg/dL   GFR calc non Af Amer 74 (*) >90 mL/min   GFR calc Af Amer 86 (*) >90 mL/min  CBC   Collection Time    12/27/12  6:03 AM      Result Value Ref Range   WBC 5.1  4.0 - 10.5 K/uL   RBC 4.59  3.87 - 5.11 MIL/uL   Hemoglobin 12.1  12.0 - 15.0 g/dL   HCT 37.3  36.0 - 46.0 %   MCV 81.3  78.0 - 100.0 fL   MCH 26.4  26.0 - 34.0 pg   MCHC 32.4  30.0 - 36.0 g/dL   RDW 15.3  11.5 - 15.5 %   Platelets 139 (*) 150 - 400 K/uL  HEPATITIS B SURFACE ANTIGEN   Collection Time    12/27/12  1:47 PM      Result Value Ref Range   Hepatitis B Surface Ag NEGATIVE  NEGATIVE  HEPATITIS C ANTIBODY   Collection Time    12/27/12  1:47 PM      Result Value Ref Range   HCV Ab NEGATIVE  NEGATIVE  LIPASE, BLOOD   Collection Time    12/27/12  1:47 PM      Result Value Ref Range   Lipase 80 (*) 11 - 59 U/L  HEPATIC FUNCTION PANEL   Collection Time    12/28/12  5:14 AM      Result Value Ref Range   Total Protein 5.9 (*) 6.0 - 8.3 g/dL   Albumin 3.2 (*) 3.5 - 5.2 g/dL   AST 70 (*) 0 - 37 U/L   ALT 162 (*) 0 - 35 U/L    Alkaline Phosphatase 87  39 - 117 U/L   Total Bilirubin 0.5  0.3 - 1.2 mg/dL   Bilirubin, Direct 0.1  0.0 - 0.3 mg/dL   Indirect Bilirubin 0.4  0.3 - 0.9 mg/dL  BASIC METABOLIC PANEL   Collection Time    12/28/12  5:14 AM      Result Value Ref Range   Sodium 143  135 - 145 mEq/L   Potassium 3.8  3.5 - 5.1 mEq/L   Chloride 113 (*) 96 - 112 mEq/L   CO2 22  19 - 32 mEq/L   Glucose, Bld 106 (*) 70 - 99 mg/dL   BUN 11  6 - 23 mg/dL   Creatinine, Ser 0.79  0.50 - 1.10 mg/dL   Calcium 10.0  8.4 - 10.5 mg/dL   GFR calc non Af Amer 85 (*) >90 mL/min   GFR calc Af Amer >90  >90 mL/min  HEPATIC FUNCTION PANEL   Collection Time    12/29/12  5:59 AM      Result Value Ref Range   Total Protein 6.2  6.0 - 8.3 g/dL   Albumin 3.3 (*) 3.5 - 5.2 g/dL   AST 31  0 - 37 U/L   ALT 112 (*) 0 - 35 U/L   Alkaline Phosphatase 86  39 - 117 U/L   Total Bilirubin 0.5  0.3 - 1.2 mg/dL   Bilirubin, Direct 0.1  0.0 - 0.3 mg/dL   Indirect Bilirubin 0.4  0.3 - 0.9 mg/dL  HEPATIC FUNCTION PANEL   Collection Time    12/30/12  5:45 AM      Result Value Ref Range   Total Protein 6.2  6.0 - 8.3 g/dL   Albumin 3.4 (*) 3.5 - 5.2 g/dL   AST 22  0 - 37 U/L   ALT 82 (*) 0 - 35 U/L   Alkaline Phosphatase 84  39 - 117 U/L   Total Bilirubin 0.5  0.3 - 1.2 mg/dL   Bilirubin, Direct 0.1  0.0 - 0.3 mg/dL   Indirect Bilirubin 0.4  0.3 - 0.9 mg/dL  PROTIME-INR   Collection Time    12/30/12  5:45 AM      Result Value Ref Range   Prothrombin Time 13.9  11.6 - 15.2 seconds   INR 1.09  0.00 - 1.49  HEPATIC FUNCTION PANEL   Collection Time    12/31/12  8:36 AM      Result Value Ref Range   Total Protein 6.8  6.0 - 8.3 g/dL   Albumin 3.7  3.5 - 5.2 g/dL   AST 18  0 - 37 U/L   ALT 65 (*) 0 - 35 U/L   Alkaline Phosphatase 89  39 - 117 U/L   Total Bilirubin 0.6  0.3 - 1.2 mg/dL   Bilirubin, Direct 0.1  0.0 - 0.3 mg/dL   Indirect Bilirubin 0.5  0.3 - 0.9 mg/dL   Her cardiologist draws her labs and reportedly there are  no problems.    Assessment Axis I Maj. depressive disorder Axis II deferred Axis III see medical history Axis IV are to moderate.  Plan: I took her vitals.  I reviewed CC, tobacco/med/surg Hx, meds effects/ side effects, problem list, therapies and responses as well as current situation/symptoms discussed options. She will continue Lexapro Neurontin and  clonazepam 0.25 mg twice a day as needed for panic attacks She will continue to use Ambien as needed. She'll return in 3 months but call if any symptoms worsen before that  See orders and pt instructions for more details.  MEDICATIONS this encounter: Meds ordered this encounter  Medications  .  escitalopram (LEXAPRO) 20 MG tablet    Sig: Take 1 tablet (20 mg total) by mouth daily.    Dispense:  30 tablet    Refill:  2  . gabapentin (NEURONTIN) 300 MG capsule    Sig: Take 1 capsule (300 mg total) by mouth 2 (two) times daily.    Dispense:  60 capsule    Refill:  2  . clonazePAM (KLONOPIN) 0.5 MG tablet    Sig: Take one half table bid as needed for anxiety    Dispense:  30 tablet    Refill:  2  . zolpidem (AMBIEN) 10 MG tablet    Sig: Take 1 tablet (10 mg total) by mouth at bedtime as needed for sleep.    Dispense:  30 tablet    Refill:  3    Medical Decision Making Problem Points:  Established problem, stable/improving (1), New problem, with additional work-up planned (4), Review of last therapy session (1) and Review of psycho-social stressors (1) Data Points:  Review or order clinical lab tests (1) Review of new medications or change in dosage (2)  I certify that outpatient services furnished can reasonably be expected to improve the patient's condition.   Levonne Spiller, MD

## 2013-11-18 ENCOUNTER — Ambulatory Visit (HOSPITAL_COMMUNITY)
Admission: RE | Admit: 2013-11-18 | Discharge: 2013-11-18 | Disposition: A | Discharge status: Home / Self Care | Payer: Medicare HMO | Source: Ambulatory Visit | Attending: Internal Medicine | Admitting: Internal Medicine

## 2013-11-18 DIAGNOSIS — Z803 Family history of malignant neoplasm of breast: Secondary | ICD-10-CM | POA: Insufficient documentation

## 2013-11-18 DIAGNOSIS — Z1231 Encounter for screening mammogram for malignant neoplasm of breast: Secondary | ICD-10-CM

## 2013-11-23 ENCOUNTER — Ambulatory Visit (INDEPENDENT_AMBULATORY_CARE_PROVIDER_SITE_OTHER): Payer: Medicare HMO | Admitting: Orthopedic Surgery

## 2013-11-23 VITALS — BP 135/75 | Ht 62.0 in | Wt 198.0 lb

## 2013-11-23 DIAGNOSIS — M6530 Trigger finger, unspecified finger: Secondary | ICD-10-CM | POA: Diagnosis not present

## 2013-11-23 NOTE — Progress Notes (Signed)
[name], female   DOB: 12-29-1945, 68 y.o.   MRN: [id number] Chief Complaint  Patient presents with  . Follow-up    Recheck left trigger thumb.    To injections triggering left thumb still has pain  Request repeat injection  I told her if she doesn't improve then we will have to do a surgical release.  Procedure note trigger thumb injection  Diagnosis trigger finger Postop diagnosis trigger finger Procedure injection of trigger finger Finger injected left  thumb Details of procedure: After verbal consent and timeout to confirm site the left thumb was injected with 1 cc of 40 mg of Depo-Medrol and 1 cc of 1% lidocaine  The procedure was tolerated well without complication

## 2013-12-20 ENCOUNTER — Other Ambulatory Visit: Payer: Self-pay | Admitting: Gastroenterology

## 2013-12-21 ENCOUNTER — Ambulatory Visit: Payer: Medicare HMO | Admitting: Orthopedic Surgery

## 2013-12-22 ENCOUNTER — Encounter: Payer: Self-pay | Admitting: Orthopedic Surgery

## 2014-01-05 ENCOUNTER — Other Ambulatory Visit (HOSPITAL_COMMUNITY): Payer: Self-pay | Admitting: Psychiatry

## 2014-01-11 ENCOUNTER — Other Ambulatory Visit: Payer: Self-pay | Admitting: Gastroenterology

## 2014-01-12 LAB — HEPATIC FUNCTION PANEL
ALT: 17 U/L (ref 0–35)
AST: 23 U/L (ref 0–37)
Albumin: 3.9 g/dL (ref 3.5–5.2)
Alkaline Phosphatase: 56 U/L (ref 39–117)
Bilirubin, Direct: 0.1 mg/dL (ref 0.0–0.3)
Indirect Bilirubin: 0.4 mg/dL (ref 0.2–1.2)
Total Bilirubin: 0.5 mg/dL (ref 0.2–1.2)
Total Protein: 6.3 g/dL (ref 6.0–8.3)

## 2014-01-18 ENCOUNTER — Ambulatory Visit: Payer: Medicare HMO | Admitting: Orthopedic Surgery

## 2014-01-18 ENCOUNTER — Telehealth (HOSPITAL_COMMUNITY): Payer: Self-pay | Admitting: *Deleted

## 2014-01-18 NOTE — Progress Notes (Signed)
Quick Note:  Let pt know LFTS are normal. ______

## 2014-01-19 ENCOUNTER — Encounter: Payer: Self-pay | Admitting: Internal Medicine

## 2014-01-24 NOTE — Telephone Encounter (Signed)
noted 

## 2014-01-28 ENCOUNTER — Ambulatory Visit (HOSPITAL_COMMUNITY): Payer: Self-pay | Admitting: Psychiatry

## 2014-01-31 ENCOUNTER — Encounter (HOSPITAL_COMMUNITY): Payer: Self-pay | Admitting: *Deleted

## 2014-01-31 ENCOUNTER — Other Ambulatory Visit (HOSPITAL_COMMUNITY): Payer: Self-pay | Admitting: *Deleted

## 2014-01-31 NOTE — Progress Notes (Signed)
[phone] Prior Auth # [id number] Zolpidem 10 mg QD PRN 30 Tablet Dispense  Authorized from 01-31-2014 to 06-23-2014  Spoke with Juliann Pulse  The Pharmacist (01-31-2014)

## 2014-01-31 NOTE — Telephone Encounter (Signed)
Opened in Error.

## 2014-02-01 ENCOUNTER — Ambulatory Visit (HOSPITAL_COMMUNITY): Payer: Self-pay | Admitting: Psychiatry

## 2014-02-03 ENCOUNTER — Ambulatory Visit (INDEPENDENT_AMBULATORY_CARE_PROVIDER_SITE_OTHER): Payer: Medicare HMO

## 2014-02-03 ENCOUNTER — Ambulatory Visit (INDEPENDENT_AMBULATORY_CARE_PROVIDER_SITE_OTHER): Payer: Medicare HMO | Admitting: Orthopedic Surgery

## 2014-02-03 VITALS — BP 168/92 | Ht 62.0 in | Wt 198.0 lb

## 2014-02-03 DIAGNOSIS — M25569 Pain in unspecified knee: Secondary | ICD-10-CM

## 2014-02-03 DIAGNOSIS — M25562 Pain in left knee: Secondary | ICD-10-CM

## 2014-02-03 DIAGNOSIS — M1710 Unilateral primary osteoarthritis, unspecified knee: Secondary | ICD-10-CM

## 2014-02-03 DIAGNOSIS — M1712 Unilateral primary osteoarthritis, left knee: Secondary | ICD-10-CM

## 2014-02-03 MED ORDER — HYDROCODONE-ACETAMINOPHEN 5-325 MG PO TABS: 1.0000 | ORAL_TABLET | Freq: Four times a day (QID) | ORAL | Status: DC | PRN

## 2014-02-03 NOTE — Patient Instructions (Signed)
You have received a steroid shot. 15% of patients experience increased pain at the injection site with in the next 24 hours. This is best treated with ice and tylenol extra strength 2 tabs every 8 hours. If you are still having pain please call the office.    

## 2014-02-04 ENCOUNTER — Encounter: Payer: Self-pay | Admitting: Orthopedic Surgery

## 2014-02-04 NOTE — Progress Notes (Signed)
Chief Complaint  Patient presents with  . Knee Pain    Left knee pain, no injry.    BP 168/92  Ht 5\' 2"  (1.575 m)  Wt 198 lb (89.812 kg)  BMI 36.21 kg/m2  HISTORY: PAIN SWELLING CATCHING STIFFNESS GIVING OUT  STABBING RADIATING   10/10  Past Medical History  Diagnosis Date  . Hypertension   . Hyperlipidemia   . Gastroesophageal reflux disease   . Diverticulosis   . Anxiety and depression   . Adenomatous colon polyp 2006    excised in 2006 & 2010Due surveillance 06/2013  . Chronic back pain   . Thyroid nodule   . Fibromyalgia   . Migraines   . Insomnia   . Chronic constipation   . Asthma   . History of benign esophageal tumor 2010    granular cell esophageal tumor (Dx 06/2008), resected via EMR 2011, due repeat EGD 02/2012  . Chest discomfort   . Hiatal hernia   . Depression   . Anxiety   . Sleep apnea     Stop Bang score of 5. Pt said she was told by Dr. Merlene Laughter that she had "a little bit" of sleep apena, but not bad enough to treat.   Past Surgical History  Procedure Laterality Date  . Tonsillectomy    . Abdominal hysterectomy    . Total knee arthroplasty  2002    Right; previous arthroscopic surgery  . Shoulder arthroscopy      Right; bone spurs removed  . Lung biopsy      negative  . 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 Right oophorectomy      benign disease  . Colonoscopy  06/2008, 06/2011    sigmoid tics, tubular adenoma; 2013: anal canal hemorrhoids  . Eus  08/2010    Dr [facility] with EGD. Retained food. No recurrent esophageal lesion, bx negative.  . Colonoscopy  07/10/2011    Anal canal hemorrhoids likely the cause of hematochezia in the setting of constipation; otherwise normal rectum ;submucosal  petechiae in left colon of doubtful clinical significance; otherwise, normal colon  . Esophagogastroduodenoscopy  12/11/2011    [id number] Schatzki's ring; otherwise normal/Small hiatal hernia.  Antral and bulbar erosions  . Bravo ph study  12/11/2011    Procedure: BRAVO [place] STUDY;  Surgeon: Daneil Dolin, MD;  Location: [facility];  Service: Endoscopy;  Laterality: N/A;  . Cholecystectomy N/A 04/09/2013    Procedure: LAPAROSCOPIC CHOLECYSTECTOMY;  Surgeon: Jamesetta So, MD;  Location: [facility] OR[facility];  Service: General;  Laterality: N/A;   Review of systems has been recorded reviewed and signed and scanned into the chart   General appearance is normal, the patient is alert and oriented x3 with normal mood and affect. BP 168/92  Ht 5\' 2"  (1.575 m)  Wt 198 lb (89.812 kg)  BMI 36.21 kg/m2 GAIT NORMAL   LEFT KNEE:  MEDIAL JOINT LINE PAIN, TENDERNESS ROM 120 STABLE  MOTOR 5/5  SKIN NORMAL   CV NORMAL  SENSATION NORMAL LYMPH NORMAL  DTR's normal   OA  on x-rays MILD  OA KNEE  Knee  Injection Procedure Note  Pre-operative Diagnosis: left knee oa  Post-operative Diagnosis: same  Indications: pain  Anesthesia: ethyl chloride   Procedure Details   Verbal consent was obtained for the procedure. Time out was completed.The joint was prepped with alcohol, followed by  Ethyl chloride spray and A 20 gauge needle was inserted into  the knee via lateral approach; 22ml 1% lidocaine and 1 ml of depomedrol  was then injected into the joint . The needle was removed and the area cleansed and dressed.  Complications:  None; patient tolerated the procedure well.

## 2014-02-07 ENCOUNTER — Ambulatory Visit (INDEPENDENT_AMBULATORY_CARE_PROVIDER_SITE_OTHER): Payer: Medicare HMO | Admitting: Cardiology

## 2014-02-07 ENCOUNTER — Telehealth: Payer: Self-pay | Admitting: *Deleted

## 2014-02-07 ENCOUNTER — Other Ambulatory Visit: Payer: Self-pay

## 2014-02-07 ENCOUNTER — Encounter: Payer: Self-pay | Admitting: Cardiology

## 2014-02-07 VITALS — BP 142/82 | HR 65 | Ht 62.0 in | Wt 197.0 lb

## 2014-02-07 DIAGNOSIS — R002 Palpitations: Secondary | ICD-10-CM

## 2014-02-07 DIAGNOSIS — I10 Essential (primary) hypertension: Secondary | ICD-10-CM

## 2014-02-07 MED ORDER — ATENOLOL 50 MG PO TABS: 50.0000 mg | ORAL_TABLET | Freq: Two times a day (BID) | ORAL | Status: DC

## 2014-02-07 NOTE — Progress Notes (Signed)
Clinical Summary [name] is a 68 y.o.female seen today for follow up of the following medical problems.   1. Lightheadedness/Dizziness  - started approx 1 year ago. Feeling of lightheadedness, but at times can feel like the room is spinning. Can happen laying down, sitting down, or standing. No association w/ exertion. Symptoms today reported as more the feeling of the room spinning, often can roll over in bed and feel as if bed is spinning.  - event monitor shows just mild sinus brady which is her baseline  - she was orthostatic at last visit, last visit stopped her terazosin  - from medication review she is actually taking atenolol 50mg  in AM and 100mg  in PM. - she was also orthostatic in clinic last visit, continues to report poor oral intake  2. Palpitations  - no significant arrythmias on 21 day event monitor  - continued on atenolol - reports occasional palpitations, approx once a week   Past Medical History  Diagnosis Date  . Hypertension   . Hyperlipidemia   . Gastroesophageal reflux disease   . Diverticulosis   . Anxiety and depression   . Adenomatous colon polyp 2006    excised in 2006 & 2010Due surveillance 06/2013  . Chronic back pain   . Thyroid nodule   . Fibromyalgia   . Migraines   . Insomnia   . Chronic constipation   . Asthma   . History of benign esophageal tumor 2010    granular cell esophageal tumor (Dx 06/2008), resected via EMR 2011, due repeat EGD 02/2012  . Chest discomfort   . Hiatal hernia   . Depression   . Anxiety   . Sleep apnea     Stop Bang score of 5. Pt said she was told by Dr. Merlene Laughter that she had "a little bit" of sleep apena, but not bad enough to treat.     Allergies  Allergen Reactions  . Elavil [Amitriptyline] Other (See Comments)    Felt really nervous and felt like something was hold her feet or arm and/ or AM headache on it and back on it and went away off it.   . Abilify [Aripiprazole] Other (See Comments)   Dystonic reaction  . Trazodone And Nefazodone Other (See Comments)    Brought back bad dreams of things in the past  . Codeine Hives, Nausea Only and Other (See Comments)    Feels funny  . Latex Hives  . Penicillin V Itching  . Penicillins Hives  . Polyethylene Glycol Other (See Comments)    Per allergy test  . Sulfonamide Derivatives Nausea And Vomiting  . Cymbalta [Duloxetine Hcl] Rash  . Remeron [Mirtazapine] Other (See Comments)    Caused lots of strange, weird, crazy dreams.     Current Outpatient Prescriptions  Medication Sig Dispense Refill  . albuterol (PROVENTIL HFA;VENTOLIN HFA) 108 (90 BASE) MCG/ACT inhaler Inhale 2 puffs into the lungs every 6 (six) hours as needed. For shortness of breath      . amLODipine (NORVASC) 5 MG tablet Take 10 mg by mouth daily.       Marland Kitchen atenolol (TENORMIN) 50 MG tablet Take 50-100 mg by mouth 2 (two) times daily. 100 mg in the morning and  50 at night      . BOTOX 200 UNITS SOLR Inject 200 Units into the skin every 3 (three) months.      . clonazePAM (KLONOPIN) 0.5 MG tablet Take one half table bid as needed for anxiety  30 tablet  2  . escitalopram (LEXAPRO) 20 MG tablet Take 1 tablet (20 mg total) by mouth daily.  30 tablet  2  . gabapentin (NEURONTIN) 300 MG capsule Take 1 capsule (300 mg total) by mouth 2 (two) times daily.  60 capsule  2  . HYDROcodone-acetaminophen (NORCO) 5-325 MG per tablet Take 1 tablet by mouth every 6 (six) hours as needed for moderate pain.  120 tablet  0  . levocetirizine (XYZAL) 5 MG tablet Take 5 mg by mouth every evening.       Marland Kitchen Linaclotide (LINZESS) 145 MCG CAPS capsule One capsule daily on empty stomach.  30 capsule  5  . lovastatin (MEVACOR) 40 MG tablet Take 1 tablet (40 mg total) by mouth at bedtime.  90 tablet  3  . ondansetron (ZOFRAN) 4 MG tablet Take 4 mg by mouth every 8 (eight) hours as needed.       . potassium chloride SA (K-DUR,KLOR-CON) 20 MEQ tablet Take 20 mEq by mouth 2 (two) times daily.      .  potassium chloride SA (K-DUR,KLOR-CON) 20 MEQ tablet TAKE ONE (1) TABLET EACH DAY  180 tablet  3  . RABEprazole (ACIPHEX) 20 MG tablet Take 1 tablet (20 mg total) by mouth 2 (two) times daily.  60 tablet  5  . sucralfate (CARAFATE) 1 G tablet TAKE ONE TABLET BY MOUTH FOUR TIMES A DAY WITH MEALS AND AT BEDTIME AS NEEDED FOR HEARTBURN AND UPPER ABDOMINAL PAIN  120 tablet  0  . traMADol (ULTRAM) 50 MG tablet Take 50 mg by mouth every 6 (six) hours as needed. For pain      . zolpidem (AMBIEN) 10 MG tablet Take 1 tablet (10 mg total) by mouth at bedtime as needed for sleep.  30 tablet  3   No current facility-administered medications for this visit.     Past Surgical History  Procedure Laterality Date  . Tonsillectomy    . Abdominal hysterectomy    . Total knee arthroplasty  2002    Right; previous arthroscopic surgery  . Shoulder arthroscopy      Right; bone spurs removed  . Lung biopsy      negative  . 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 Right oophorectomy      benign disease  . Colonoscopy  06/2008, 06/2011    sigmoid tics, tubular adenoma; 2013: anal canal hemorrhoids  . Eus  08/2010    Dr [facility] with EGD. Retained food. No recurrent esophageal lesion, bx negative.  . Colonoscopy  07/10/2011    Anal canal hemorrhoids likely the cause of hematochezia in the setting of constipation; otherwise normal rectum ;submucosal  petechiae in left colon of doubtful clinical significance; otherwise, normal colon  . Esophagogastroduodenoscopy  12/11/2011    [id number] Schatzki's ring; otherwise normal/Small hiatal hernia. Antral and bulbar erosions  . Bravo ph study  12/11/2011    Procedure: BRAVO [place] STUDY;  Surgeon: Daneil Dolin, MD;  Location: [facility];  Service: Endoscopy;  Laterality: N/A;  . Cholecystectomy N/A 04/09/2013    Procedure: LAPAROSCOPIC CHOLECYSTECTOMY;  Surgeon: Jamesetta So, MD;  Location: [facility];  Service: General;   Laterality: N/A;     Allergies  Allergen Reactions  . Elavil [Amitriptyline] Other (See Comments)    Felt really nervous and felt like something was hold her feet or arm and/ or AM headache on it and back on it and went away  off it.   . Abilify [Aripiprazole] Other (See Comments)    Dystonic reaction  . Trazodone And Nefazodone Other (See Comments)    Brought back bad dreams of things in the past  . Codeine Hives, Nausea Only and Other (See Comments)    Feels funny  . Latex Hives  . Penicillin V Itching  . Penicillins Hives  . Polyethylene Glycol Other (See Comments)    Per allergy test  . Sulfonamide Derivatives Nausea And Vomiting  . Cymbalta [Duloxetine Hcl] Rash  . Remeron [Mirtazapine] Other (See Comments)    Caused lots of strange, weird, crazy dreams.      Family History  Problem Relation Age of Onset  . Stroke Father   . Alcohol abuse Father   . Heart attack Mother   . Depression Mother   . Anxiety disorder Mother   . Colon cancer Paternal Aunt   . Colon cancer Paternal Uncle   . Dementia Maternal Uncle   . ADD / ADHD Neg Hx   . Bipolar disorder Neg Hx   . Drug abuse Neg Hx   . OCD Neg Hx   . Paranoid behavior Neg Hx   . Schizophrenia Neg Hx   . Seizures Neg Hx   . Sexual abuse Neg Hx   . Physical abuse Neg Hx      Social History [name] reports that she has never smoked. She has never used smokeless tobacco. [name] reports that she does not drink alcohol.   Review of Systems CONSTITUTIONAL: No weight loss, fever, chills, weakness or fatigue.  HEENT: Eyes: No visual loss, blurred vision, double vision or yellow sclerae.No hearing loss, sneezing, congestion, runny nose or sore throat.  SKIN: No rash or itching.  CARDIOVASCULAR: no chest pain, no orthopnea, no PND RESPIRATORY: No shortness of breath, cough or sputum.  GASTROINTESTINAL: No anorexia, nausea, vomiting or diarrhea. No abdominal pain or blood.  GENITOURINARY: No burning on  urination, no polyuria NEUROLOGICAL: per HPI MUSCULOSKELETAL: No muscle, back pain, joint pain or stiffness.  LYMPHATICS: No enlarged nodes. No history of splenectomy.  PSYCHIATRIC: No history of depression or anxiety.  ENDOCRINOLOGIC: No reports of sweating, cold or heat intolerance. No polyuria or polydipsia.  Marland Kitchen   Physical Examination p 65 bp 142/82 Wt 197 lbs BMI 36 Gen: resting comfortably, no acute distress HEENT: no scleral icterus, pupils equal round and reactive, no palptable cervical adenopathy,  CV: RRR, no m/r/g, no JVD, no carotid bruits Resp: Clear to auscultation bilaterally GI: abdomen is soft, non-tender, non-distended, normal bowel sounds, no hepatosplenomegaly MSK: extremities are warm, no edema.  Skin: warm, no rash Neuro:  no focal deficits Psych: appropriate affect   Diagnostic Studies 01/21/13 EKG: sinus brady rate 54, no block, no ischemic changes   02/17/13 Event monitor: baseline sinus brady in 50s, symptoms of dizziness correlate w/ sinus brady in 50s. Occasional PVCs, 1 PVC couplet.   01/2012 Carotid US: minimal plaque at bifurcations, no stenosis   Pertinent labs 12/2012: Na 142 K 3.7 Cl 112 Cr 0.8     Assessment and Plan  1. Dizziness  - symptoms most suggestive of vertigo  - she was however orthostatic by blood pressure at our last appointment, terazosin was stopped - encouraged better oral hydration - she is also taking more than recommended dose of atenolol, with sinus brady on EKG. Prior monitor showed sinus brady in 50s at baseline. Decrease atenolol to 50mg  bid - she will discuss possible vertigo with pcp, she  will discuss her pscyh meds and possible side effects including neurontin with her psychiatrist if these initial changes don't help  2. Palpitations  - negative event monitor  - continue atenolol         Arnoldo Lenis, M.D., F.A.C.C.

## 2014-02-07 NOTE — Telephone Encounter (Signed)
Pharmacy didn't get our e-scribe stating dose was changed, they are unable to view,gave verbal to pharmacist

## 2014-02-07 NOTE — Telephone Encounter (Signed)
Pharmacy has questions about directions for atenolol that was called in today.

## 2014-02-07 NOTE — Patient Instructions (Signed)
Your physician wants you to follow-up in: 6 months You will receive a reminder letter in the mail two months in advance. If you don't receive a letter, please call our office to schedule the follow-up appointment.     Your physician has recommended you make the following change in your medication:      DECREASE Atenolol to 50 mg twice a day      Thank you for choosing [facility] !

## 2014-02-22 ENCOUNTER — Ambulatory Visit (INDEPENDENT_AMBULATORY_CARE_PROVIDER_SITE_OTHER): Payer: Medicare HMO | Admitting: Psychiatry

## 2014-02-22 ENCOUNTER — Encounter (HOSPITAL_COMMUNITY): Payer: Self-pay | Admitting: Psychiatry

## 2014-02-22 VITALS — BP 139/73 | HR 61 | Ht 62.0 in | Wt 192.2 lb

## 2014-02-22 DIAGNOSIS — F330 Major depressive disorder, recurrent, mild: Secondary | ICD-10-CM

## 2014-02-22 DIAGNOSIS — F329 Major depressive disorder, single episode, unspecified: Secondary | ICD-10-CM

## 2014-02-22 MED ORDER — ZOLPIDEM TARTRATE 10 MG PO TABS: 10.0000 mg | ORAL_TABLET | Freq: Every day | ORAL | Status: DC

## 2014-02-22 MED ORDER — CLONAZEPAM 0.5 MG PO TABS: ORAL_TABLET | ORAL | Status: DC

## 2014-02-22 MED ORDER — GABAPENTIN 300 MG PO CAPS: 300.0000 mg | ORAL_CAPSULE | Freq: Three times a day (TID) | ORAL | Status: DC

## 2014-02-22 MED ORDER — ESCITALOPRAM OXALATE 20 MG PO TABS: 20.0000 mg | ORAL_TABLET | Freq: Every day | ORAL | Status: DC

## 2014-02-22 NOTE — Progress Notes (Signed)
[name], female   DOB: 12-25-1945, 68 y.o.   MRN: [id number] [name], female   DOB: Apr 07, 1946, 68 y.o.   MRN: [id number] [name], female   DOB: October 20, 1945, 68 y.o.   MRN: [id number] [name], female   DOB: 11-04-1945, 68 y.o.   MRN: [id number] [name], female   DOB: 08/10/1945, 68 y.o.   MRN: [id number] [facility] [id number] Progress Note [name] MRN: [id number] DOB: April 02, 1946 Age: 68 y.o.  Date: 02/22/2014 Start Time: 9:45 AM End Time: 10:10 AM  Chief Complaint: Chief Complaint  Patient presents with  . Anxiety  . Depression  . Follow-up    Subjective: "I'm not sleeping well."  This patient is a 68 year old black female who lives with her 65 year old daughter who has mild mental retardation. She lives in [place]. She is a retired [employer]. The patient states that she's been depressed "all my life.". She can't give any specific reasons for the depression but notes that at age 53 she was already taking medication to help with sleep. In her 88s her family doctor diagnosed with schizophrenia although she had no symptoms of paranoia or delusions or hallucinations. She was given medication for this.  Since 1997 she's been coming here with symptoms of depression. She's also had significant problems with fibromyalgia and fatigue. In Neurontin and Ambien have helped. Lately however she's become more depressed and worried. She has had gallbladder surgery and she's concerned about how is going to go. For some reason she's been thinking a lot about death. She doesn't have anyone to stay with her daughter something goes wrong. She's been more negative but is densely not suicidal but her energy has dropped. In general she is a very functional person  The patient returns after 4 months. She's doing a bit better. She's been more depressed lately. Several cousins have died in the last month. She's not  sleeping well and doesn't think the Ambien is working for her anymore. She's tried trazodone mirtazapine and they don't work. I told her we could try a new medicine called Belsommra. Her mood is somewhat low but she's not suicidal and I think we need to get her sleep improved  Current psychiatric medication Neurontin 300 mg 3 times a day Ambien 10 mg at bedtime Lexapro 20 mg every morning Clonazepam 0.5 mg when necessary twice a day Vitals: BP 139/73  Pulse 61  Ht 5\' 2"  (1.575 m)  Wt 192 lb 3.2 oz (87.181 kg)  BMI 35.14 kg/m2  Psychiatric history Patient has a long history of depression.  She has been seeing in this office since May 1997.  Patient denies any history of suicidal attempt however endorse chronic depression and anxiety.  In the past she has taken Prozac but did not like after taking few doses, she also has taken Seroquel, Abilify, Xanax, Pamelor, and amitriptyline.  Patient denies a history of paranoia or delusions however there are times when she has been very depressed and have a lot of negative symptoms.  Alcohol and substance use history Patient denies any history of alcohol or substance use.  Allergies: Allergies  Allergen Reactions  . Elavil [Amitriptyline] Other (See Comments)    Felt really nervous and felt like something was hold her feet or arm and/ or AM headache on it and back on it and went away off it.   . Abilify [Aripiprazole] Other (See Comments)  Dystonic reaction  . Trazodone And Nefazodone Other (See Comments)    Brought back bad dreams of things in the past  . Codeine Hives, Nausea Only and Other (See Comments)    Feels funny  . Latex Hives  . Penicillin V Itching  . Penicillins Hives  . Polyethylene Glycol Other (See Comments)    Per allergy test  . Sulfonamide Derivatives Nausea And Vomiting  . Cymbalta [Duloxetine Hcl] Rash  . Remeron [Mirtazapine] Other (See Comments)    Caused lots of strange, weird, crazy dreams.   Medical  History: Past Medical History  Diagnosis Date  . Hypertension   . Hyperlipidemia   . Gastroesophageal reflux disease   . Diverticulosis   . Anxiety and depression   . Adenomatous colon polyp 2006    excised in 2006 & 2010Due surveillance 06/2013  . Chronic back pain   . Thyroid nodule   . Fibromyalgia   . Migraines   . Insomnia   . Chronic constipation   . Asthma   . History of benign esophageal tumor 2010    granular cell esophageal tumor (Dx 06/2008), resected via EMR 2011, due repeat EGD 02/2012  . Chest discomfort   . Hiatal hernia   . Depression   . Anxiety   . Sleep apnea     Stop Bang score of 5. Pt said she was told by Dr. Merlene Laughter that she had "a little bit" of sleep apena, but not bad enough to treat.  Patient has history of hypertension, hyperlipidemia, GERD, diverticulosis, colonic polyp, chronic back pain, fibromyalgia, chronic constipation, benign tumor of esophagus and chronic headache.  Her primary care physician is Dr. Legrand Rams, she sees Dr. Paulita Cradle for chronic pain, fibromyalgia and headache. Her cardiologist is Dr Elvera Maria. Surgical History: Past Surgical History  Procedure Laterality Date  . Tonsillectomy    . Abdominal hysterectomy    . Total knee arthroplasty  2002    Right; previous arthroscopic surgery  . Shoulder arthroscopy      Right; bone spurs removed  . Lung biopsy      negative  . 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 Right oophorectomy      benign disease  . Colonoscopy  06/2008, 06/2011    sigmoid tics, tubular adenoma; 2013: anal canal hemorrhoids  . Eus  08/2010    [facility] with [facility]GD. Retained food. No recurrent esophageal lesion, bx negative.  . Colonoscopy  07/10/2011    Anal canal hemorrhoids likely the cause of hematochezia in the setting of constipation; otherwise normal rectum ;submucosal  petechiae in left colon of doubtful clinical significance; otherwise, normal colon  .  Esophagogastroduodenoscopy  12/11/2011    [id number] Schatzki's ring; otherwise normal/Small hiatal hernia. Antral and bulbar erosions  . Bravo ph study  12/11/2011    Procedure: BRAVO Tiro STUDY;  Surgeon: Daneil Dolin, MD;  Location: [facility] SUITE;  Service: Endoscopy;  Laterality: N/A;  . Cholecystectomy N/A 04/09/2013    Procedure: LAPAROSCOPIC CHOLECYSTECTOMY;  Surgeon: Jamesetta So, MD;  Location: [facility] ORS;  Service: General;  Laterality: N/A;   Family History: family history includes Alcohol abuse in her father; Anxiety disorder in her mother; Colon cancer in her paternal aunt and paternal uncle; Dementia in her maternal uncle; Depression in her mother; Heart attack in her mother; Stroke in her father. There is no history of ADD / ADHD, Bipolar disorder, Drug abuse, OCD, Paranoid behavior, Schizophrenia, Seizures, Sexual  abuse, or Physical abuse. Reviewed and nothing new today again.  Mental status examination Patient is casually dressed and fairly groomed. She is calm cooperative and maintained fair eye contact. She described her mood as a bit low and her affect is somewhat flat She still has chronic pain  Her speech is coherent but slow.  Her thought processes clear and logical. She denies suicidal ideation but no plans, no active or passive homicidal thoughts. She denies any auditory or visual hallucination. There no psychotic symptoms present at this time. She's alert and oriented x3. Her insight judgment impulse control is okay  Lab Results:  Results for orders placed in visit on 01/11/14 (from the past 8736 hour(s))  HEPATIC FUNCTION PANEL   Collection Time    01/11/14  4:14 PM      Result Value Ref Range   Total Bilirubin 0.5  0.2 - 1.2 mg/dL   Bilirubin, Direct 0.1  0.0 - 0.3 mg/dL   Indirect Bilirubin 0.4  0.2 - 1.2 mg/dL   Alkaline Phosphatase 56  39 - 117 U/L   AST 23  0 - 37 U/L   ALT 17  0 - 35 U/L   Total Protein 6.3  6.0 - 8.3 g/dL   Albumin 3.9  3.5 - 5.2 g/dL   Results for orders placed during the hospital encounter of 04/05/13 (from the past 8736 hour(s))  HEPATIC FUNCTION PANEL   Collection Time    04/05/13  1:15 PM      Result Value Ref Range   Total Protein 6.4  6.0 - 8.3 g/dL   Albumin 3.8  3.5 - 5.2 g/dL   AST 14  0 - 37 U/L   ALT 12  0 - 35 U/L   Alkaline Phosphatase 70  39 - 117 U/L   Total Bilirubin 0.5  0.3 - 1.2 mg/dL   Bilirubin, Direct 0.1  0.0 - 0.3 mg/dL   Indirect Bilirubin 0.4  0.3 - 0.9 mg/dL  BASIC METABOLIC PANEL   Collection Time    04/05/13  1:15 PM      Result Value Ref Range   Sodium 143  135 - 145 mEq/L   Potassium 4.4  3.5 - 5.1 mEq/L   Chloride 105  96 - 112 mEq/L   CO2 28  19 - 32 mEq/L   Glucose, Bld 93  70 - 99 mg/dL   BUN 14  6 - 23 mg/dL   Creatinine, Ser 0.95  0.50 - 1.10 mg/dL   Calcium 10.7 (*) 8.4 - 10.5 mg/dL   GFR calc non Af Amer 61 (*) >90 mL/min   GFR calc Af Amer 70 (*) >90 mL/min  CBC WITH DIFFERENTIAL   Collection Time    04/05/13  1:15 PM      Result Value Ref Range   WBC 4.8  4.0 - 10.5 K/uL   RBC 4.92  3.87 - 5.11 MIL/uL   Hemoglobin 13.2  12.0 - 15.0 g/dL   HCT 40.9  36.0 - 46.0 %   MCV 83.1  78.0 - 100.0 fL   MCH 26.8  26.0 - 34.0 pg   MCHC 32.3  30.0 - 36.0 g/dL   RDW 15.8 (*) 11.5 - 15.5 %   Platelets 175  150 - 400 K/uL   Neutrophils Relative % 59  43 - 77 %   Neutro Abs 2.8  1.7 - 7.7 K/uL   Lymphocytes Relative 32  12 - 46 %   Lymphs Abs 1.5  0.7 - 4.0 K/uL   Monocytes Relative 7  3 - 12 %   Monocytes Absolute 0.3  0.1 - 1.0 K/uL   Eosinophils Relative 2  0 - 5 %   Eosinophils Absolute 0.1  0.0 - 0.7 K/uL   Basophils Relative 0  0 - 1 %   Basophils Absolute 0.0  0.0 - 0.1 K/uL   Her cardiologist draws her labs and reportedly there are no problems.    Assessment Axis I Maj. depressive disorder Axis II deferred Axis III see medical history Axis IV are to moderate.  Plan: I took her vitals.  I reviewed CC, tobacco/med/surg Hx, meds effects/ side effects, problem  list, therapies and responses as well as current situation/symptoms discussed options. She will continue Lexapro Neurontin and  clonazepam 0.25 mg twice a day as needed for panic attacks She will discontinue Ambien and try Belsommra 20 mg at bedtime. She's been given samples and will let us know if this helps She'll return in  4 weeks but call if any symptoms worsen before that  See orders and pt instructions for more details.  MEDICATIONS this encounter: Meds ordered this encounter  Medications  . DISCONTD: gabapentin (NEURONTIN) 300 MG capsule    Sig: Take 300 mg by mouth 3 (three) times daily.  Marland Kitchen DISCONTD: zolpidem (AMBIEN) 10 MG tablet    Sig: Take 10 mg by mouth at bedtime.  Marland Kitchen zolpidem (AMBIEN) 10 MG tablet    Sig: Take 1 tablet (10 mg total) by mouth at bedtime.    Dispense:  30 tablet    Refill:  2  . clonazePAM (KLONOPIN) 0.5 MG tablet    Sig: Take one half table bid as needed for anxiety    Dispense:  30 tablet    Refill:  2  . escitalopram (LEXAPRO) 20 MG tablet    Sig: Take 1 tablet (20 mg total) by mouth daily.    Dispense:  30 tablet    Refill:  2  . gabapentin (NEURONTIN) 300 MG capsule    Sig: Take 1 capsule (300 mg total) by mouth 3 (three) times daily.    Dispense:  90 capsule    Refill:  2    Medical Decision Making Problem Points:  Established problem, stable/improving (1), New problem, with additional work-up planned (4), Review of last therapy session (1) and Review of psycho-social stressors (1) Data Points:  Review or order clinical lab tests (1) Review of new medications or change in dosage (2)  I certify that outpatient services furnished can reasonably be expected to improve the patient's condition.   Levonne Spiller, MD

## 2014-03-16 ENCOUNTER — Ambulatory Visit: Payer: Self-pay | Admitting: Gastroenterology

## 2014-03-24 ENCOUNTER — Encounter (HOSPITAL_COMMUNITY): Payer: Self-pay | Admitting: Psychiatry

## 2014-03-24 ENCOUNTER — Ambulatory Visit (HOSPITAL_COMMUNITY): Payer: Self-pay | Admitting: Psychiatry

## 2014-04-12 ENCOUNTER — Ambulatory Visit (INDEPENDENT_AMBULATORY_CARE_PROVIDER_SITE_OTHER): Payer: Medicare HMO | Admitting: Gastroenterology

## 2014-04-12 ENCOUNTER — Telehealth: Payer: Self-pay

## 2014-04-12 ENCOUNTER — Encounter: Payer: Self-pay | Admitting: Gastroenterology

## 2014-04-12 VITALS — BP 148/88 | HR 60 | Temp 98.6°F | Resp 18 | Ht 62.0 in | Wt 194.0 lb

## 2014-04-12 DIAGNOSIS — K219 Gastro-esophageal reflux disease without esophagitis: Secondary | ICD-10-CM

## 2014-04-12 DIAGNOSIS — R1032 Left lower quadrant pain: Secondary | ICD-10-CM

## 2014-04-12 DIAGNOSIS — K5900 Constipation, unspecified: Secondary | ICD-10-CM

## 2014-04-12 MED ORDER — LUBIPROSTONE 8 MCG PO CAPS: 8.0000 ug | ORAL_CAPSULE | Freq: Two times a day (BID) | ORAL | Status: DC

## 2014-04-12 MED ORDER — DEXLANSOPRAZOLE 60 MG PO CPDR: 60.0000 mg | DELAYED_RELEASE_CAPSULE | Freq: Every day | ORAL | Status: DC

## 2014-04-12 NOTE — Telephone Encounter (Signed)
She has failed every other PPI. Let's try a P.A.

## 2014-04-12 NOTE — Telephone Encounter (Signed)
[facility] called- dexilant is not covered and they are wanting to know if we can change her to something else.

## 2014-04-12 NOTE — Patient Instructions (Signed)
1. Start Dexilant 60mg  daily before breakfast. RX sent to pharmacy. 2. Start Amitiza 72mcg one to two times daily with food. RX sent to pharmacy. 3. Today, take one bottle of Magnesium Citrate to purge your colon. 4. Please request referral to gynecologist from Dr. Legrand Rams. Derrek Monaco NP at [facility] in [place] [phone]) or [facility] in [place] [phone]). 5. Call in 2-3 weeks with a progress report. If you still have abdominal pain despite improvement in bowel function, then we will consider labs and CT scan.

## 2014-04-12 NOTE — Progress Notes (Signed)
Primary Care Physician: Rosita Fire, MD  Primary Gastroenterologist:  Garfield Cornea, MD   Chief Complaint  Patient presents with  . Follow-up    HPI: [name] is a 68 y.o. female here for followup. Last seen in March 2015. History of refractory GERD, constipation. Also with history of granular cell tumor of the esophagus (2010) followed previously by Dr. Newman Pies at [place] in 2012 Dr. Newman Pies showed no recurrent tumor. EGD in 2013 by Dr. Gala Romney showed no recurrent tumor. Seen by Dr. Newman Pies in January 2013 for refractory heartburn. EGD and gastric emptying study offered, the patient declined further workup.  No longer on the carafate because it was worse than the heartburn. Dexilant seemed to help some but didn't call for RX after ran out of samples. Complains of burning all the way into mouth. Intermittent but every day. Wake up in the morning with it. No nocturnal heartburn. No dysphagia. No n/v. No early satiety since cholecystectomy. BM once per week but not very much. Linzess not covered by insurance. They will pay for amitiza. C/o griping. MOM just causes a big mess. LLQ intermittently, not sure if related to BMs or food. No melena, brbpr. Last BM Saturday. Four months of vaginal itching, has tried multiple OTC antiyeast creams.   Interested in hemorrhoid banding at some point. This is been delayed until constipation better managed.   Current Outpatient Prescriptions  Medication Sig Dispense Refill  . albuterol (PROVENTIL HFA;VENTOLIN HFA) 108 (90 BASE) MCG/ACT inhaler Inhale 2 puffs into the lungs every 6 (six) hours as needed. For shortness of breath      . amLODipine (NORVASC) 5 MG tablet Take 10 mg by mouth daily.       Marland Kitchen atenolol (TENORMIN) 50 MG tablet Take 1 tablet (50 mg total) by mouth 2 (two) times daily.  180 tablet  3  . BOTOX 200 UNITS SOLR Inject 200 Units into the skin every 3 (three) months.      . clonazePAM (KLONOPIN) 0.5 MG tablet Take one half  table bid as needed for anxiety  30 tablet  2  . escitalopram (LEXAPRO) 20 MG tablet Take 1 tablet (20 mg total) by mouth daily.  30 tablet  2  . gabapentin (NEURONTIN) 300 MG capsule Take 1 capsule (300 mg total) by mouth 3 (three) times daily.  90 capsule  2  . HYDROcodone-acetaminophen (NORCO) 5-325 MG per tablet Take 1 tablet by mouth every 6 (six) hours as needed for moderate pain.  120 tablet  0  . levocetirizine (XYZAL) 5 MG tablet Take 5 mg by mouth every evening.       . lovastatin (MEVACOR) 40 MG tablet Take 1 tablet (40 mg total) by mouth at bedtime.  90 tablet  3  . ondansetron (ZOFRAN) 4 MG tablet Take 4 mg by mouth every 8 (eight) hours as needed.       . potassium chloride SA (K-DUR,KLOR-CON) 20 MEQ tablet Take 20 mEq by mouth once.       . traMADol (ULTRAM) 50 MG tablet Take 50 mg by mouth every 6 (six) hours as needed. For pain      . zolpidem (AMBIEN) 10 MG tablet Take 1 tablet (10 mg total) by mouth at bedtime.  30 tablet  2  . alendronate (FOSAMAX) 70 MG tablet 70 mg once a week.       No current facility-administered medications for this visit.    Allergies as of 04/12/2014 - Review  Complete 04/12/2014  Allergen Reaction Noted  . Elavil [amitriptyline] Other (See Comments) 06/03/2012  . Abilify [aripiprazole] Other (See Comments) 11/25/2012  . Trazodone and nefazodone Other (See Comments) 08/31/2012  . Codeine Hives, Nausea Only, and Other (See Comments)   . Latex Hives 11/21/2011  . Penicillin v Itching 08/02/2013  . Penicillins Hives   . Polyethylene glycol Other (See Comments) 12/26/2012  . Sulfonamide derivatives Nausea And Vomiting   . Cymbalta [duloxetine hcl] Rash 03/10/2013  . Remeron [mirtazapine] Other (See Comments) 05/06/2012    ROS:  General: Negative for anorexia, weight loss, fever, chills, fatigue, weakness. ENT: Negative for hoarseness, difficulty swallowing , nasal congestion. CV: Negative for chest pain, angina, palpitations, dyspnea on  exertion, peripheral edema.  Respiratory: Negative for dyspnea at rest, dyspnea on exertion, cough, sputum, wheezing.  GI: See history of present illness. GU:  Negative for dysuria, hematuria, urinary incontinence, urinary frequency, nocturnal urination. See history of present illness Endo: Negative for unusual weight change.    Physical Examination:   BP 148/88  Pulse 60  Temp(Src) 98.6 F (37 C) (Oral)  Resp 18  Ht 5\' 2"  (1.575 m)  Wt 194 lb (87.998 kg)  BMI 35.47 kg/m2  General: Well-nourished, well-developed in no acute distress.  Eyes: No icterus. Mouth: Oropharyngeal mucosa moist and pink , no lesions erythema or exudate. Lungs: Clear to auscultation bilaterally.  Heart: Regular rate and rhythm, no murmurs rubs or gallops.  Abdomen: Bowel sounds are normal, nontender, nondistended, no hepatosplenomegaly or masses, no abdominal bruits or hernia , no rebound or guarding.   Extremities: No lower extremity edema. No clubbing or deformities. Neuro: Alert and oriented x 4   Skin: Warm and dry, no jaundice.   Psych: Alert and cooperative, normal mood and affect.    Impression/plan: Ongoing GERD symptoms, currently not on any PPI therapy or Carafate. Samples of Dexilant provided at last office visit, took 3 weeks worth and seemed to do better. Never called for prescription. Has failed all other PPI therapies. We will work on getting this covered by her insurance company. She states Carafate caused more burning so she stopped it. Discussed antireflux measures.  Continues to have constipation issues. Reluctant to try anything that suggested today stating she either had abdominal cramping/griping or was ineffective. She decided to try Amitiza again 74mcg BID. She will do MagCitrate purge first. Suspect her left lower quadrant pain related to constipation. If she continues to have pain despite adequate improvement in bowel function, consider labs and CT scan at that time. She will call in  2-3 weeks with a progress report.  Regarding chronic vaginal itching, suggested that she followup with a gynecologist. She was given numbers for [facility] and [facility].

## 2014-04-13 NOTE — Telephone Encounter (Signed)
Pharmacy is aware and will fax info to [place].

## 2014-04-15 ENCOUNTER — Encounter: Payer: Self-pay | Admitting: Gastroenterology

## 2014-04-20 NOTE — Progress Notes (Signed)
cc'ed to pcp °

## 2014-04-22 ENCOUNTER — Ambulatory Visit (INDEPENDENT_AMBULATORY_CARE_PROVIDER_SITE_OTHER): Payer: Medicare HMO | Admitting: Psychiatry

## 2014-04-22 ENCOUNTER — Encounter (HOSPITAL_COMMUNITY): Payer: Self-pay | Admitting: Psychiatry

## 2014-04-22 VITALS — BP 150/82 | HR 64 | Ht 62.0 in | Wt 193.4 lb

## 2014-04-22 DIAGNOSIS — F329 Major depressive disorder, single episode, unspecified: Secondary | ICD-10-CM

## 2014-04-22 DIAGNOSIS — F330 Major depressive disorder, recurrent, mild: Secondary | ICD-10-CM

## 2014-04-22 MED ORDER — CLONAZEPAM 1 MG PO TABS
1.0000 mg | ORAL_TABLET | Freq: Two times a day (BID) | ORAL | Status: DC
Start: 2014-04-22 — End: 2014-06-09

## 2014-04-22 MED ORDER — ESCITALOPRAM OXALATE 20 MG PO TABS: 20.0000 mg | ORAL_TABLET | Freq: Two times a day (BID) | ORAL | Status: DC

## 2014-04-22 MED ORDER — ZOLPIDEM TARTRATE 10 MG PO TABS: 10.0000 mg | ORAL_TABLET | Freq: Every day | ORAL | Status: DC

## 2014-04-22 MED ORDER — GABAPENTIN 300 MG PO CAPS: 300.0000 mg | ORAL_CAPSULE | Freq: Three times a day (TID) | ORAL | Status: DC

## 2014-04-22 NOTE — Progress Notes (Signed)
[name], female   DOB: 01-24-1946, 68 y.o.   MRN: [id number] [name], female   DOB: 20-Jan-1946, 68 y.o.   MRN: [id number] [name], female   DOB: 1945/09/21, 68 y.o.   MRN: [id number] [name], female   DOB: 14-Jul-1945, 68 y.o.   MRN: [id number] [name], female   DOB: November 27, 1945, 68 y.o.   MRN: [id number] [name], female   DOB: 12-03-1945, 68 y.o.   MRN: [id number] [facility] [address] Progress Note [name] MRN: [id number] DOB: [birth year] Age: 68 y.o.  Date: 04/22/2014 Start Time: 9:45 AM End Time: 10:10 AM  Chief Complaint: Chief Complaint  Patient presents with  . Anxiety  . Depression  . Follow-up    Subjective: "I'm stressed about money "  This patient is a 68 year old black female who lives with her 24 year old daughter who has mild mental retardation. She lives in [place]. She is a retired [employer]. The patient states that she's been depressed "all my life.". She can't give any specific reasons for the depression but notes that at age 68 she was already taking medication to help with sleep. In her 72s her family doctor diagnosed with schizophrenia although she had no symptoms of paranoia or delusions or hallucinations. She was given medication for this.  Since 1997 she's been coming here with symptoms of depression. She's also had significant problems with fibromyalgia and fatigue. In Neurontin and Ambien have helped. Lately however she's become more depressed and worried. She has had gallbladder surgery and she's concerned about how is going to go. For some reason she's been thinking a lot about death. She doesn't have anyone to stay with her daughter something goes wrong. She's been more negative but is densely not suicidal but her energy has dropped. In general she is a very functional person  The patient returns after 2 months. Several more cousins have died  since I last saw her. She's had numerous deaths in her family this year which has gotten her more depressed. 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 She denies being suicidal but just feels more sad and overwhelmed. Her sleep is variable in none of the medicines ever help all that well even the newest one, Belsomra. I suggested we increase her antidepressant and perhaps the medicine for anxiety as well  Current psychiatric medication Neurontin 300 mg 3 times a day Ambien 10 mg at bedtime Lexapro 20 mg every morning Clonazepam 0.5 mg when necessary twice a day Vitals: BP 150/82  Pulse 64  Ht 5\' 2"  (1.575 m)  Wt 193 lb 6.4 oz (87.726 kg)  BMI 35.36 kg/m2  Psychiatric history Patient has a long history of depression.  She has been seeing in this office since May 1997.  Patient denies any history of suicidal attempt however endorse chronic depression and anxiety.  In the past she has taken Prozac but did not like after taking few doses, she also has taken Seroquel, Abilify, Xanax, Pamelor, and amitriptyline.  Patient denies a history of paranoia or delusions however there are times when she has been very depressed and have a lot of negative symptoms.  Alcohol and substance use history Patient denies any history of alcohol or substance use.  Allergies: Allergies  Allergen Reactions  . Elavil [Amitriptyline] Other (See Comments)    Felt really nervous and felt like something was hold her feet or  arm and/ or AM headache on it and back on it and went away off it.   . Abilify [Aripiprazole] Other (See Comments)    Dystonic reaction  . Trazodone And Nefazodone Other (See Comments)    Brought back bad dreams of things in the past  . Codeine Hives, Nausea Only and Other (See Comments)    Feels funny  . Latex Hives  . Penicillin V Itching  . Penicillins Hives  . Polyethylene Glycol Other (See Comments)    Per allergy test  . Sulfonamide Derivatives Nausea And Vomiting   . Cymbalta [Duloxetine Hcl] Rash  . Remeron [Mirtazapine] Other (See Comments)    Caused lots of strange, weird, crazy dreams.   Medical History: Past Medical History  Diagnosis Date  . Hypertension   . Hyperlipidemia   . Gastroesophageal reflux disease   . Diverticulosis   . Anxiety and depression   . Adenomatous colon polyp 2006    excised in 2006 & 2010Due surveillance 06/2013  . Chronic back pain   . Thyroid nodule   . Fibromyalgia   . Migraines   . Insomnia   . Chronic constipation   . Asthma   . History of benign esophageal tumor 2010    granular cell esophageal tumor (Dx 06/2008), resected via EMR 2011, due repeat EGD 02/2012  . Chest discomfort   . Hiatal hernia   . Depression   . Anxiety   . Sleep apnea     Stop Bang score of 5. Pt said she was told by Dr. Merlene Laughter that she had "a little bit" of sleep apena, but not bad enough to treat.  Patient has history of hypertension, hyperlipidemia, GERD, diverticulosis, colonic polyp, chronic back pain, fibromyalgia, chronic constipation, benign tumor of esophagus and chronic headache.  Her primary care physician is Dr. Legrand Rams, she sees Dr. Paulita Cradle for chronic pain, fibromyalgia and headache. Her cardiologist is Dr Elvera Maria. Surgical History: Past Surgical History  Procedure Laterality Date  . Tonsillectomy    . Abdominal hysterectomy    . Total knee arthroplasty  2002    Right; previous arthroscopic surgery  . Shoulder arthroscopy      Right; bone spurs removed  . Lung biopsy      negative  . 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 Right oophorectomy      benign disease  . Colonoscopy  06/2008, 06/2011    sigmoid tics, tubular adenoma; 2013: anal canal hemorrhoids  . Eus  08/2010    Dr [facility] with EGD. Retained food. No recurrent esophageal lesion, bx negative.  . Colonoscopy  07/10/2011    Anal canal hemorrhoids likely the cause of hematochezia in the setting of  constipation; otherwise normal rectum ;submucosal  petechiae in left colon of doubtful clinical significance; otherwise, normal colon  . Esophagogastroduodenoscopy  12/11/2011    [id number] Schatzki's ring; otherwise normal/Small hiatal hernia. Antral and bulbar erosions  . Bravo ph study  12/11/2011    Procedure: BRAVO [place] STUDY;  Surgeon: Daneil Dolin, MD;  Location: [facility];  Service: Endoscopy;  Laterality: N/A;  . Cholecystectomy N/A 04/09/2013    Procedure: LAPAROSCOPIC CHOLECYSTECTOMY;  Surgeon: Jamesetta So, MD;  Location: [facility] ORS;  Service: General;  Laterality: N/A;   Family History: family history includes Alcohol abuse in her father; Anxiety disorder in her mother; Colon cancer in her paternal aunt and paternal uncle; Dementia in her maternal uncle; Depression in her mother;  Heart attack in her mother; Stroke in her father. There is no history of ADD / ADHD, Bipolar disorder, Drug abuse, OCD, Paranoid behavior, Schizophrenia, Seizures, Sexual abuse, or Physical abuse. Reviewed and nothing new today again.  Mental status examination Patient is casually dressed and fairly groomed. She is calm cooperative and maintained fair eye contact. She described her mood as as depressed and her affect is somewhat flat She still has chronic pain  Her speech is coherent but slow.  Her thought processes clear and logical. She denies suicidal ideation but no plans, no active or passive homicidal thoughts. She denies any auditory or visual hallucination. There no psychotic symptoms present at this time. She's alert and oriented x3. Her insight judgment impulse control is okay  Lab Results:  Results for orders placed in visit on 01/11/14 (from the past 8736 hour(s))  HEPATIC FUNCTION PANEL   Collection Time    01/11/14  4:14 PM      Result Value Ref Range   Total Bilirubin 0.5  0.2 - 1.2 mg/dL   Bilirubin, Direct 0.1  0.0 - 0.3 mg/dL   Indirect Bilirubin 0.4  0.2 - 1.2 mg/dL   Alkaline  Phosphatase 56  39 - 117 U/L   AST 23  0 - 37 U/L   ALT 17  0 - 35 U/L   Total Protein 6.3  6.0 - 8.3 g/dL   Albumin 3.9  3.5 - 5.2 g/dL   Her cardiologist draws her labs and reportedly there are no problems.    Assessment Axis I Maj. depressive disorder Axis II deferred Axis III see medical history Axis IV are to moderate.  Plan: I took her vitals.  I reviewed CC, tobacco/med/surg Hx, meds effects/ side effects, problem list, therapies and responses as well as current situation/symptoms discussed options. She will increase Lexapro to 20 mg twice a day, continue Neurontin 300 mg 3 times a day and Ambien 10 mg daily at bedtime. She'll increase clonazepam to 1 mg twice a day as needed She will return in 2 months  See orders and pt instructions for more details.  MEDICATIONS this encounter: Meds ordered this encounter  Medications  . escitalopram (LEXAPRO) 20 MG tablet    Sig: Take 1 tablet (20 mg total) by mouth 2 (two) times daily.    Dispense:  60 tablet    Refill:  2  . gabapentin (NEURONTIN) 300 MG capsule    Sig: Take 1 capsule (300 mg total) by mouth 3 (three) times daily.    Dispense:  90 capsule    Refill:  2  . clonazePAM (KLONOPIN) 1 MG tablet    Sig: Take 1 tablet (1 mg total) by mouth 2 (two) times daily.    Dispense:  60 tablet    Refill:  2  . zolpidem (AMBIEN) 10 MG tablet    Sig: Take 1 tablet (10 mg total) by mouth at bedtime.    Dispense:  30 tablet    Refill:  2    Medical Decision Making Problem Points:  Established problem, stable/improving (1), New problem, with additional work-up planned (4), Review of last therapy session (1) and Review of psycho-social stressors (1) Data Points:  Review or order clinical lab tests (1) Review of new medications or change in dosage (2)  I certify that outpatient services furnished can reasonably be expected to improve the patient's condition.   Levonne Spiller, MD

## 2014-04-25 ENCOUNTER — Other Ambulatory Visit (HOSPITAL_COMMUNITY): Payer: Self-pay | Admitting: Internal Medicine

## 2014-04-25 ENCOUNTER — Ambulatory Visit (HOSPITAL_COMMUNITY)
Admission: RE | Admit: 2014-04-25 | Discharge: 2014-04-25 | Disposition: A | Discharge status: Home / Self Care | Payer: Medicare HMO | Source: Ambulatory Visit | Attending: Internal Medicine | Admitting: Internal Medicine

## 2014-04-25 DIAGNOSIS — M25562 Pain in left knee: Secondary | ICD-10-CM | POA: Insufficient documentation

## 2014-04-25 DIAGNOSIS — M179 Osteoarthritis of knee, unspecified: Secondary | ICD-10-CM | POA: Insufficient documentation

## 2014-04-25 DIAGNOSIS — W19XXXA Unspecified fall, initial encounter: Secondary | ICD-10-CM | POA: Insufficient documentation

## 2014-04-26 ENCOUNTER — Telehealth (HOSPITAL_COMMUNITY): Payer: Self-pay | Admitting: *Deleted

## 2014-04-27 ENCOUNTER — Encounter (HOSPITAL_COMMUNITY): Payer: Self-pay | Admitting: Emergency Medicine

## 2014-04-27 ENCOUNTER — Emergency Department (HOSPITAL_COMMUNITY): Payer: Medicare HMO

## 2014-04-27 ENCOUNTER — Emergency Department (HOSPITAL_COMMUNITY)
Admission: EM | Admit: 2014-04-27 | Discharge: 2014-04-27 | Disposition: A | Discharge status: Home / Self Care | Payer: Medicare HMO | Attending: Emergency Medicine | Admitting: Emergency Medicine

## 2014-04-27 DIAGNOSIS — F419 Anxiety disorder, unspecified: Secondary | ICD-10-CM | POA: Insufficient documentation

## 2014-04-27 DIAGNOSIS — G43909 Migraine, unspecified, not intractable, without status migrainosus: Secondary | ICD-10-CM | POA: Insufficient documentation

## 2014-04-27 DIAGNOSIS — Z79899 Other long term (current) drug therapy: Secondary | ICD-10-CM | POA: Diagnosis not present

## 2014-04-27 DIAGNOSIS — M549 Dorsalgia, unspecified: Secondary | ICD-10-CM | POA: Insufficient documentation

## 2014-04-27 DIAGNOSIS — I10 Essential (primary) hypertension: Secondary | ICD-10-CM | POA: Insufficient documentation

## 2014-04-27 DIAGNOSIS — G479 Sleep disorder, unspecified: Secondary | ICD-10-CM | POA: Diagnosis not present

## 2014-04-27 DIAGNOSIS — M25462 Effusion, left knee: Secondary | ICD-10-CM

## 2014-04-27 DIAGNOSIS — Z91040 Latex allergy status: Secondary | ICD-10-CM | POA: Diagnosis not present

## 2014-04-27 DIAGNOSIS — M1712 Unilateral primary osteoarthritis, left knee: Secondary | ICD-10-CM | POA: Diagnosis not present

## 2014-04-27 DIAGNOSIS — Z8639 Personal history of other endocrine, nutritional and metabolic disease: Secondary | ICD-10-CM | POA: Diagnosis not present

## 2014-04-27 DIAGNOSIS — Z880 Allergy status to penicillin: Secondary | ICD-10-CM | POA: Diagnosis not present

## 2014-04-27 DIAGNOSIS — K219 Gastro-esophageal reflux disease without esophagitis: Secondary | ICD-10-CM | POA: Insufficient documentation

## 2014-04-27 DIAGNOSIS — Z7983 Long term (current) use of bisphosphonates: Secondary | ICD-10-CM | POA: Insufficient documentation

## 2014-04-27 DIAGNOSIS — G8929 Other chronic pain: Secondary | ICD-10-CM | POA: Diagnosis not present

## 2014-04-27 DIAGNOSIS — M25562 Pain in left knee: Secondary | ICD-10-CM | POA: Diagnosis present

## 2014-04-27 DIAGNOSIS — Z86010 Personal history of colonic polyps: Secondary | ICD-10-CM | POA: Insufficient documentation

## 2014-04-27 DIAGNOSIS — W19XXXA Unspecified fall, initial encounter: Secondary | ICD-10-CM

## 2014-04-27 DIAGNOSIS — F329 Major depressive disorder, single episode, unspecified: Secondary | ICD-10-CM | POA: Insufficient documentation

## 2014-04-27 DIAGNOSIS — K5900 Constipation, unspecified: Secondary | ICD-10-CM | POA: Insufficient documentation

## 2014-04-27 NOTE — ED Notes (Signed)
Pt reports she was seen by Dr. Legrand Rams on Monday for her knee, xrays completed then.

## 2014-04-27 NOTE — ED Notes (Signed)
Pt reports she is tired of waiting. Did not want to wait for discharge papers. Pt was seen and evaluated by the EDPa. Pt ambulatory and in no acute distress.

## 2014-04-27 NOTE — ED Provider Notes (Signed)
CSN: [id number]     Arrival date & time 04/27/14  1140 History   None    Chief Complaint  Patient presents with  . Knee Pain     (Consider location/radiation/quality/duration/timing/severity/associated sxs/prior Treatment) HPI Comments: Patient is a 68 year old female presents to the emergency department with complaint of left knee pain. The patient states that she fell from a standing position, tripping over the threshold for a door and injured the left knee 4 days ago. The patient was seen by Dr. Legrand Rams, had x-rays done, but has not had the results. The patient states that she is able to bear weight and ambulate, but has pain, she is also frustrated that she has not heard from the x-ray reports. She presents to the emergency department for assistance with her pain and to see if she can obtain the report from x-ray. The patient has not had previous operations or procedures on the left knee.Tylenol and over-the-counter arthritis ribs are not helping the knee pain.  Patient is a 68 y.o. female presenting with knee pain. The history is provided by the patient.  Knee Pain Associated symptoms: back pain   Associated symptoms: no neck pain     Past Medical History  Diagnosis Date  . Hypertension   . Hyperlipidemia   . Gastroesophageal reflux disease   . Diverticulosis   . Anxiety and depression   . Adenomatous colon polyp 2006    excised in 2006 & 2010Due surveillance 06/2013  . Chronic back pain   . Thyroid nodule   . Fibromyalgia   . Migraines   . Insomnia   . Chronic constipation   . Asthma   . History of benign esophageal tumor 2010    granular cell esophageal tumor (Dx 06/2008), resected via EMR 2011, due repeat EGD 02/2012  . Chest discomfort   . Hiatal hernia   . Depression   . Anxiety   . Sleep apnea     Stop Bang score of 5. Pt said she was told by Dr. Merlene Laughter that she had "a little bit" of sleep apena, but not bad enough to treat.   Past Surgical History  Procedure  Laterality Date  . Tonsillectomy    . Abdominal hysterectomy    . Total knee arthroplasty  2002    Right; previous arthroscopic surgery  . Shoulder arthroscopy      Right; bone spurs removed  . Lung biopsy      negative  . 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 Right oophorectomy      benign disease  . Colonoscopy  06/2008, 06/2011    sigmoid tics, tubular adenoma; 2013: anal canal hemorrhoids  . Eus  08/2010    Dr [facility] with EGD. Retained food. No recurrent esophageal lesion, bx negative.  . Colonoscopy  07/10/2011    Anal canal hemorrhoids likely the cause of hematochezia in the setting of constipation; otherwise normal rectum ;submucosal  petechiae in left colon of doubtful clinical significance; otherwise, normal colon  . Esophagogastroduodenoscopy  12/11/2011    [id number] Schatzki's ring; otherwise normal/Small hiatal hernia. Antral and bulbar erosions  . Bravo ph study  12/11/2011    Procedure: BRAVO [place] STUDY;  Surgeon: Daneil Dolin, MD;  Location: [facility];  Service: Endoscopy;  Laterality: N/A;  . Cholecystectomy N/A 04/09/2013    Procedure: LAPAROSCOPIC CHOLECYSTECTOMY;  Surgeon: Jamesetta So, MD;  Location: [facility];  Service: General;  Laterality: N/A;  Family History  Problem Relation Age of Onset  . Stroke Father   . Alcohol abuse Father   . Heart attack Mother   . Depression Mother   . Anxiety disorder Mother   . Colon cancer Paternal Aunt   . Colon cancer Paternal Uncle   . Dementia Maternal Uncle   . ADD / ADHD Neg Hx   . Bipolar disorder Neg Hx   . Drug abuse Neg Hx   . OCD Neg Hx   . Paranoid behavior Neg Hx   . Schizophrenia Neg Hx   . Seizures Neg Hx   . Sexual abuse Neg Hx   . Physical abuse Neg Hx    History  Substance Use Topics  . Smoking status: Never Smoker   . Smokeless tobacco: Never Used  . Alcohol Use: No   OB History    No data available     Review of Systems   Constitutional: Negative for activity change.       All ROS Neg except as noted in HPI  Eyes: Negative for photophobia and discharge.  Respiratory: Negative for cough, shortness of breath and wheezing.   Cardiovascular: Negative for chest pain and palpitations.  Gastrointestinal: Positive for constipation. Negative for abdominal pain and blood in stool.  Genitourinary: Negative for dysuria, frequency and hematuria.  Musculoskeletal: Positive for back pain and arthralgias. Negative for neck pain.  Skin: Negative.   Neurological: Positive for headaches. Negative for dizziness, seizures and speech difficulty.  Psychiatric/Behavioral: Positive for sleep disturbance. Negative for hallucinations and confusion. The patient is nervous/anxious.       Allergies  Elavil; Abilify; Trazodone and nefazodone; Codeine; Latex; Penicillin v; Penicillins; Polyethylene glycol; Sulfonamide derivatives; Cymbalta; and Remeron  Home Medications   Prior to Admission medications   Medication Sig Start Date End Date Taking? Authorizing Provider  albuterol (PROVENTIL HFA;VENTOLIN HFA) 108 (90 BASE) MCG/ACT inhaler Inhale 2 puffs into the lungs every 6 (six) hours as needed. For shortness of breath    Historical Provider, MD  alendronate (FOSAMAX) 70 MG tablet 70 mg once a week. 02/24/14   Historical Provider, MD  amLODipine (NORVASC) 5 MG tablet Take 10 mg by mouth daily.  01/21/13   Yehuda Savannah, MD  atenolol (TENORMIN) 50 MG tablet Take 1 tablet (50 mg total) by mouth 2 (two) times daily. 02/07/14   Arnoldo Lenis, MD  BOTOX 200 UNITS SOLR Inject 200 Units into the skin every 3 (three) months. 11/11/12   Historical Provider, MD  clonazePAM (KLONOPIN) 1 MG tablet Take 1 tablet (1 mg total) by mouth 2 (two) times daily. 04/22/14 04/22/15  Levonne Spiller, MD  dexlansoprazole (DEXILANT) 60 MG capsule Take 1 capsule (60 mg total) by mouth daily. 04/12/14   Mahala Menghini, PA-C  escitalopram (LEXAPRO) 20 MG tablet  Take 1 tablet (20 mg total) by mouth 2 (two) times daily. 04/22/14 04/22/15  Levonne Spiller, MD  gabapentin (NEURONTIN) 300 MG capsule Take 1 capsule (300 mg total) by mouth 3 (three) times daily. 04/22/14   Levonne Spiller, MD  HYDROcodone-acetaminophen (NORCO) 5-325 MG per tablet Take 1 tablet by mouth every 6 (six) hours as needed for moderate pain. 02/03/14   Carole Civil, MD  levocetirizine (XYZAL) 5 MG tablet Take 5 mg by mouth as needed.  07/07/13   Historical Provider, MD  lovastatin (MEVACOR) 40 MG tablet Take 1 tablet (40 mg total) by mouth at bedtime. 09/29/13   Arnoldo Lenis, MD  l[facility]) 8  MCG capsule Take 1 capsule (8 mcg total) by mouth 2 (two) times daily with a meal. 04/12/14   Mahala Menghini, PA-C  ondansetron (ZOFRAN) 4 MG tablet Take 4 mg by mouth every 8 (eight) hours as needed.  02/27/13   Historical Provider, MD  potassium chloride SA (K-DUR,KLOR-CON) 20 MEQ tablet Take 20 mEq by mouth once.     Historical Provider, MD  traMADol (ULTRAM) 50 MG tablet Take 50 mg by mouth every 6 (six) hours as needed. For pain    Historical Provider, MD  Vitamin D, Ergocalciferol, (DRISDOL) 50000 UNITS CAPS capsule Take 50,000 Units by mouth every 7 (seven) days.    Historical Provider, MD  zolpidem (AMBIEN) 10 MG tablet Take 1 tablet (10 mg total) by mouth at bedtime. 04/22/14   Levonne Spiller, MD   BP 145/71 mmHg  Pulse 77  Temp(Src) 98.7 F (37.1 C) (Oral)  Resp 18  Ht 5\' 2"  (1.575 m)  Wt 193 lb (87.544 kg)  BMI 35.29 kg/m2  SpO2 100% Physical Exam  Constitutional: She is oriented to person, place, and time. She appears well-developed and well-nourished.  Non-toxic appearance.  HENT:  Head: Normocephalic.  Right Ear: Tympanic membrane and external ear normal.  Left Ear: Tympanic membrane and external ear normal.  Eyes: EOM and lids are normal. Pupils are equal, round, and reactive to light.  Neck: Normal range of motion. Neck supple. Carotid bruit is not present.   Cardiovascular: Normal rate, regular rhythm, normal heart sounds, intact distal pulses and normal pulses.   Pulmonary/Chest: Breath sounds normal. No respiratory distress.  Abdominal: Soft. Bowel sounds are normal. There is no tenderness. There is no guarding.  Musculoskeletal:       Left knee: She exhibits decreased range of motion and effusion. She exhibits no deformity and no erythema. Tenderness found. Medial joint line and patellar tendon tenderness noted.  Lymphadenopathy:       Head (right side): No submandibular adenopathy present.       Head (left side): No submandibular adenopathy present.    She has no cervical adenopathy.  Neurological: She is alert and oriented to person, place, and time. She has normal strength. No cranial nerve deficit or sensory deficit.  Skin: Skin is warm and dry.  Psychiatric: She has a normal mood and affect. Her speech is normal.  Nursing note and vitals reviewed.   ED Course  Procedures (including critical care time) Labs Review Labs Reviewed - No data to display  Imaging Review No results found. Results for orders placed or performed in visit on 01/11/14  Hepatic function panel  Result Value Ref Range   Total Bilirubin 0.5 0.2 - 1.2 mg/dL   Bilirubin, Direct 0.1 0.0 - 0.3 mg/dL   Indirect Bilirubin 0.4 0.2 - 1.2 mg/dL   Alkaline Phosphatase 56 39 - 117 U/L   AST 23 0 - 37 U/L   ALT 17 0 - 35 U/L   Total Protein 6.3 6.0 - 8.3 g/dL   Albumin 3.9 3.5 - 5.2 g/dL   Dg Knee Complete 4 Views Left  04/25/2014   CLINICAL DATA:  Generalized left knee pain 1 year. Fall two days ago with bilateral knee pain left greater than right.  EXAM: LEFT KNEE - COMPLETE 4+ VIEW  COMPARISON:  02/03/2014  FINDINGS: Examination demonstrates mild to moderate tricompartmental osteoarthritis most prominent over the patellofemoral joint. There is a suggestion of a small joint effusion. There is no acute fracture or dislocation. Mild chondrocalcinosis over  the medial and  lateral compartments.  IMPRESSION: No acute fracture.  Mild to moderate osteoarthritis.  Small joint effusion.   Electronically Signed   By: Marin Olp M.D.   On: 04/25/2014 14:16     EKG Interpretation None      MDM  Vital signs are well within normal limits. X-ray of the left knee that was done on November 2 was retrieved and reviewed by me. No acute fractures noted. There is a moderate amount of bone osteoarthritis present as well as a small joint effusion, present.  I presented this information to the patient. We discussed pain management and the patient now acknowledges that she is being treated for her pain by Dr. Aline Brochure, she says sometimes it works and sometimes does not. I offered the patient an injection here in the emergency department, however she states she is driving and does not have anyone to come and pick her up. I requested the patient to follow-up with her primary doctor as well as her specialist to inform them of her difficulty with pain management at this point.   Final diagnoses:  Fall    *I have reviewed nursing notes, vital signs, and all appropriate lab and imaging results for this patient.**    Lenox Ahr, PA-C 04/29/14 [place] Alvino Chapel, MD 04/29/14 [id number]

## 2014-04-27 NOTE — ED Notes (Signed)
Pt reports fell and landed on left knee after tripping over door frame on Saturday. No obvious deformity noted. Pt able to bear weight and ambulate with steady gait.

## 2014-04-29 NOTE — Discharge Instructions (Signed)
Arthritis, Nonspecific PLEASE SEE DR Aline Brochure AND DR Legrand Rams CONCERNING YOUR PAIN MANAGEMENT. YOU XRAY IS NEGATIVE FOR FRACTURE OR DISLOCATION. IT DOES REVEAL A SMALL AMOUNT OF FLUID(EFFUSION), AND SEVERAL AREAS OF ARTHRITIS.                                                        Arthritis is pain, redness, warmth, or puffiness (inflammation) of a joint. The joint may be stiff or hurt when you move it. One or more joints may be affected. There are many types of arthritis. Your doctor may not know what type you have right away. The most common cause of arthritis is wear and tear on the joint (osteoarthritis). HOME CARE   Only take medicine as told by your doctor.  Rest the joint as much as possible.  Raise (elevate) your joint if it is puffy.  Use crutches if the painful joint is in your leg.  Drink enough fluids to keep your pee (urine) clear or pale yellow.  Follow your doctor's diet instructions.  Use cold packs for very bad joint pain for 10 to 15 minutes every hour. Ask your doctor if it is okay for you to use hot packs.  Exercise as told by your doctor.  Take a warm shower if you have stiffness in the morning.  Move your sore joints throughout the day. GET HELP RIGHT AWAY IF:   You have a fever.  You have very bad joint pain, puffiness, or redness.  You have many joints that are painful and puffy.  You are not getting better with treatment.  You have very bad back pain or leg weakness.  You cannot control when you poop (bowel movement) or pee (urinate).  You do not feel better in 24 hours or are getting worse.  You are having side effects from your medicine. MAKE SURE YOU:   Understand these instructions.  Will watch your condition.  Will get help right away if you are not doing well or get worse. Document Released: 09/04/2009 Document Revised: 12/10/2011 Document Reviewed: 09/04/2009 [facility] Patient Information 2015 [place], [place]. This information is not  intended to replace advice given to you by your health care provider. Make sure you discuss any questions you have with your health care provider.

## 2014-05-02 ENCOUNTER — Ambulatory Visit (INDEPENDENT_AMBULATORY_CARE_PROVIDER_SITE_OTHER): Payer: Medicare HMO | Admitting: Orthopedic Surgery

## 2014-05-02 ENCOUNTER — Encounter: Payer: Self-pay | Admitting: Orthopedic Surgery

## 2014-05-02 VITALS — BP 137/80 | Ht 62.0 in | Wt 193.0 lb

## 2014-05-02 DIAGNOSIS — S8000XA Contusion of unspecified knee, initial encounter: Secondary | ICD-10-CM | POA: Insufficient documentation

## 2014-05-02 MED ORDER — HYDROCODONE-ACETAMINOPHEN 10-325 MG PO TABS: 1.0000 | ORAL_TABLET | ORAL | Status: DC | PRN

## 2014-05-02 NOTE — Patient Instructions (Signed)
Apply heat three times daily Heel slides #25 3 times daily

## 2014-05-02 NOTE — Progress Notes (Signed)
[name], female   DOB: [birth year], 68 y.o.   MRN: [id number] Chief Complaint  Patient presents with  . Knee Injury    left knee injury, fall, DOI 04/23/14   History 68 year old female tripped over the threshold of her doors at home and landed on both knees. Status post right total knee. Sustained bilateral knee contusions with negative x-ray in the ER. Patient fell approximately 9 days ago presents with continued complaints of pain. The hydrocodone 5 mg not helping her. She takes that for other pain-related issues.  She is status post visit with her primary care doctor I have his notes he is referred her on to me for further treatment after x-rays were negative  Most of her pain is over the front of the knee associated with ecchymosis stiffness swelling.  Past Medical History  Diagnosis Date  . Hypertension   . Hyperlipidemia   . Gastroesophageal reflux disease   . Diverticulosis   . Anxiety and depression   . Adenomatous colon polyp 2006    excised in 2006 & 2010Due surveillance 06/2013  . Chronic back pain   . Thyroid nodule   . Fibromyalgia   . Migraines   . Insomnia   . Chronic constipation   . Asthma   . History of benign esophageal tumor 2010    granular cell esophageal tumor (Dx 06/2008), resected via EMR 2011, due repeat EGD 02/2012  . Chest discomfort   . Hiatal hernia   . Depression   . Anxiety   . Sleep apnea     Stop Bang score of 5. Pt said she was told by Dr. Merlene Laughter that she had "a little bit" of sleep apena, but not bad enough to treat.    Review of systems denies numbness or tingling Complains of some weakness in the legs  BP 137/80 mmHg  Ht 5\' 2"  (1.575 m)  Wt 193 lb (87.544 kg)  BMI 35.29 kg/m2 She has no gross abnormalities. She is oriented 3. Her mood is normal. Affect flat. Ambulation is slow but no limp  Right knee tender to palpation over the anterior knee with ecchymosis and the skin well-healed incision is noted passive range  of motion is 110 knee feels stable. Strength is normal straight leg raise intact skin ecchymotic pulses normal sensation normal  Left knee much more ecchymosis in the knee and lower leg with tenderness throughout passive range of motion 115 knees feel stable motor exam is normal skin is ecchymotic pulses are intact and sensation is normal  My independent interpretation of the left knee x-ray is that she has no acute process but does have underlying osteoarthritis and chondrocalcinosis  Impression.dx  Encounter Diagnosis  Name Primary?  . Knee contusion, unspecified laterality, initial encounter Yes    My recommendation is that she use heat Knee slides Hydrocodone 10 mg Return in 2 weeks  Meds ordered this encounter  Medications  . HYDROcodone-acetaminophen (NORCO) 10-325 MG per tablet    Sig: Take 1 tablet by mouth every 4 (four) hours as needed.    Dispense:  84 tablet    Refill:  0

## 2014-05-09 ENCOUNTER — Encounter (HOSPITAL_COMMUNITY): Payer: Self-pay | Admitting: *Deleted

## 2014-05-09 NOTE — Progress Notes (Signed)
Prior Auth Escitalopram 20mg  BID Approved until 06-24-15 Approval code [id number]

## 2014-05-10 ENCOUNTER — Ambulatory Visit (INDEPENDENT_AMBULATORY_CARE_PROVIDER_SITE_OTHER): Payer: Medicare HMO | Admitting: Orthopedic Surgery

## 2014-05-10 ENCOUNTER — Encounter: Payer: Self-pay | Admitting: Orthopedic Surgery

## 2014-05-10 VITALS — BP 153/87 | Ht 62.0 in | Wt 193.0 lb

## 2014-05-10 DIAGNOSIS — M7150 Other bursitis, not elsewhere classified, unspecified site: Secondary | ICD-10-CM

## 2014-05-10 DIAGNOSIS — S8000XD Contusion of unspecified knee, subsequent encounter: Secondary | ICD-10-CM

## 2014-05-10 DIAGNOSIS — M7050 Other bursitis of knee, unspecified knee: Secondary | ICD-10-CM

## 2014-05-10 MED ORDER — PREDNISONE (PAK) 10 MG PO TABS: ORAL_TABLET | ORAL | Status: DC

## 2014-05-10 NOTE — Patient Instructions (Signed)
Start dose pack for 12 days

## 2014-05-10 NOTE — Progress Notes (Signed)
Subjective:     [name], female   DOB: 02/11/1946, 68 y.o.   MRN: [id number] Chief Complaint  Patient presents with  . Follow-up    3 month recheck Left knee s/p injection     HPI 68 year old female tripped over the threshold of the doors of her home on October 31 injured both knees. I treated her with hydrocodone 10 which made her itch and knee slides. She presents now with continued pain swelling and tenderness and ecchymosisin both legs left worse than right  Review of Systems Complains of plantar foot pain    Objective:   Physical Exam Left knee tenderness and swelling of the pes bursa right knee the same. No instability in either knee. Ambulatory status normal. Tenderness both feet plantar. Ecchymosis and swelling left ankle.    Assessment:     pes bursitis and inflammation from subcutaneous bleeding     Plan:     Steroid Dosepak for 2 weeks

## 2014-05-18 ENCOUNTER — Telehealth: Payer: Self-pay | Admitting: *Deleted

## 2014-05-18 NOTE — Telephone Encounter (Signed)
PA  DEXILANT CAPSULE DR APPROVED FROM 06/24/2013--06/24/2015

## 2014-05-31 ENCOUNTER — Encounter: Payer: Self-pay | Admitting: Orthopedic Surgery

## 2014-05-31 ENCOUNTER — Ambulatory Visit (INDEPENDENT_AMBULATORY_CARE_PROVIDER_SITE_OTHER): Payer: Medicare HMO | Admitting: Orthopedic Surgery

## 2014-05-31 ENCOUNTER — Other Ambulatory Visit: Payer: Self-pay | Admitting: *Deleted

## 2014-05-31 VITALS — BP 130/70 | Ht 62.0 in | Wt 193.0 lb

## 2014-05-31 DIAGNOSIS — M7050 Other bursitis of knee, unspecified knee: Secondary | ICD-10-CM

## 2014-05-31 DIAGNOSIS — M7150 Other bursitis, not elsewhere classified, unspecified site: Secondary | ICD-10-CM

## 2014-05-31 MED ORDER — HYDROCODONE-ACETAMINOPHEN 10-325 MG PO TABS: 1.0000 | ORAL_TABLET | ORAL | Status: DC | PRN

## 2014-05-31 NOTE — Progress Notes (Signed)
[name], female   DOB: [birth year], 68 y.o.   MRN: [id number]  Complaint recheck left knee and right knee status post injury October 31 where she sustained contusions to both legs. She was treated with prednisone for bursitis of legs.  She still having bursitis although she did get some improvement from the Dosepak  Review of systems leg swelling.  She has tenderness to palpation of the pain is answering bursa of both legs with swelling she has excellent range of motion in both knees symmetric both knees are stable. Motor exam bilaterally normal. Skin normal as well and each leg.  We injected both lower extremities at the pes bursa  Procedure note  Injection  Verbal consent was obtained to inject the  right and left has anserine bursa  Timeout procedure was completed to confirm injection site  Diagnosis bursitis  Medications used Depo-Medrol 40 mg 1 cc Lidocaine 1% plain 3 cc  Anesthesia was provided by ethyl chloride spray  Prep was performed with alcohol  Technique of injection  25-gauge needle was used to inject the area of maximal tenderness   No complications were noted

## 2014-05-31 NOTE — Patient Instructions (Signed)

## 2014-06-09 ENCOUNTER — Telehealth (HOSPITAL_COMMUNITY): Payer: Self-pay | Admitting: *Deleted

## 2014-06-09 ENCOUNTER — Ambulatory Visit (INDEPENDENT_AMBULATORY_CARE_PROVIDER_SITE_OTHER): Payer: Medicare HMO | Admitting: Psychiatry

## 2014-06-09 ENCOUNTER — Encounter (HOSPITAL_COMMUNITY): Payer: Self-pay | Admitting: Psychiatry

## 2014-06-09 VITALS — BP 160/100 | Ht 62.0 in | Wt 203.0 lb

## 2014-06-09 DIAGNOSIS — F330 Major depressive disorder, recurrent, mild: Secondary | ICD-10-CM

## 2014-06-09 DIAGNOSIS — F329 Major depressive disorder, single episode, unspecified: Secondary | ICD-10-CM

## 2014-06-09 MED ORDER — GABAPENTIN 300 MG PO CAPS: 300.0000 mg | ORAL_CAPSULE | Freq: Three times a day (TID) | ORAL | Status: DC

## 2014-06-09 MED ORDER — SUVOREXANT 20 MG PO TABS: 20.0000 mg | ORAL_TABLET | Freq: Every day | ORAL | Status: DC

## 2014-06-09 MED ORDER — CLONAZEPAM 1 MG PO TABS: 1.0000 mg | ORAL_TABLET | Freq: Two times a day (BID) | ORAL | Status: DC

## 2014-06-09 MED ORDER — VILAZODONE HCL 40 MG PO TABS: 40.0000 mg | ORAL_TABLET | Freq: Every day | ORAL | Status: DC

## 2014-06-09 MED ORDER — ZOLPIDEM TARTRATE 10 MG PO TABS: 10.0000 mg | ORAL_TABLET | Freq: Every day | ORAL | Status: DC

## 2014-06-09 NOTE — Telephone Encounter (Signed)
She can come back and get samples. In the meantime try to get it aurhorized

## 2014-06-09 NOTE — Progress Notes (Signed)
[name], female   DOB: 1945-12-04, 68 y.o.   MRN: [id number] [name], female   DOB: 29-Dec-1945, 68 y.o.   MRN: [id number] [name], female   DOB: 1945/07/14, 68 y.o.   MRN: [id number] [name], female   DOB: 1945-08-08, 68 y.o.   MRN: [id number] [name], female   DOB: [birth year], 68 y.o.   MRN: [id number] [name], female   DOB: 1945-11-12, 68 y.o.   MRN: [id number] [name], female   DOB: 1946-01-09, 68 y.o.   MRN: [id number] [facility] [id number] Progress Note [name] MRN: [id number] DOB: Jun 25, 1945 Age: 68 y.o.  Date: 06/09/2014 Start Time: 9:45 AM End Time: 10:10 AM  Chief Complaint: Chief Complaint  Patient presents with  . Depression  . Anxiety  . Follow-up    Subjective: "I'm stressed about money "  This patient is a 68 year old black female who lives with her 56 year old daughter who has mild mental retardation. She lives in [place]. She is a retired [employer]. The patient states that she's been depressed "all my life.". She can't give any specific reasons for the depression but notes that at age 68 she was already taking medication to help with sleep. In her 68s her family doctor diagnosed with schizophrenia although she had no symptoms of paranoia or delusions or hallucinations. She was given medication for this.  Since 1997 she's been coming here with symptoms of depression. She's also had significant problems with fibromyalgia and fatigue. In Neurontin and Ambien have helped. Lately however she's become more depressed and worried. She has had gallbladder surgery and she's concerned about how is going to go. For some reason she's been thinking a lot about death. She doesn't have anyone to stay with her daughter something goes wrong. She's been more negative but is densely not suicidal but her energy has dropped. In general she is a very  functional person  The patient returns after 2 months. She states that she is still depressed despite the increase in the Lexapro. She fell recently at a family gathering and hurt her knee so she is in chronic pain now. She is getting injections in her knee and is taking Norco. Her mood has been low. Her sister was supposed to visit her on Thanksgiving but never showed up which is really upset her. She only sleeps with Ambien but she reports confusion and memory loss and I strongly suggested that she go back to [place]. She would also like to try a different antidepressant and she is already been on many so we will try Viibryd. She denies suicidal ideation  Current psychiatric medication Neurontin 300 mg 3 times a day Ambien 10 mg at bedtime Lexapro 20 mg every morning Clonazepam 0.5 mg when necessary twice a day Vitals: BP 160/100 mmHg  Ht 5\' 2"  (1.575 m)  Wt 203 lb (92.08 kg)  BMI 37.12 kg/m2  Psychiatric history Patient has a long history of depression.  She has been seeing in this office since May 1997.  Patient denies any history of suicidal attempt however endorse chronic depression and anxiety.  In the past she has taken Prozac but did not like after taking few doses, she also has taken Seroquel, Abilify, Xanax, Pamelor, and amitriptyline.  Patient denies a history of paranoia or delusions however there are times when she has been very depressed and have a lot of  negative symptoms.  Alcohol and substance use history Patient denies any history of alcohol or substance use.  Allergies: Allergies  Allergen Reactions  . Elavil [Amitriptyline] Other (See Comments)    Felt really nervous and felt like something was hold her feet or arm and/ or AM headache on it and back on it and went away off it.   . Abilify [Aripiprazole] Other (See Comments)    Dystonic reaction  . Trazodone And Nefazodone Other (See Comments)    Brought back bad dreams of things in the past  . Codeine Hives, Nausea  Only and Other (See Comments)    Feels funny  . Latex Hives  . Penicillin V Itching  . Penicillins Hives  . Polyethylene Glycol Other (See Comments)    Per allergy test  . Sulfonamide Derivatives Nausea And Vomiting  . Cymbalta [Duloxetine Hcl] Rash  . Remeron [Mirtazapine] Other (See Comments)    Caused lots of strange, weird, crazy dreams.   Medical History: Past Medical History  Diagnosis Date  . Hypertension   . Hyperlipidemia   . Gastroesophageal reflux disease   . Diverticulosis   . Anxiety and depression   . Adenomatous colon polyp 2006    excised in 2006 & 2010Due surveillance 06/2013  . Chronic back pain   . Thyroid nodule   . Fibromyalgia   . Migraines   . Insomnia   . Chronic constipation   . Asthma   . History of benign esophageal tumor 2010    granular cell esophageal tumor (Dx 06/2008), resected via EMR 2011, due repeat EGD 02/2012  . Chest discomfort   . Hiatal hernia   . Depression   . Anxiety   . Sleep apnea     Stop Bang score of 5. Pt said she was told by Dr. Merlene Laughter that she had "a little bit" of sleep apena, but not bad enough to treat.  Patient has history of hypertension, hyperlipidemia, GERD, diverticulosis, colonic polyp, chronic back pain, fibromyalgia, chronic constipation, benign tumor of esophagus and chronic headache.  Her primary care physician is Dr. Legrand Rams, she sees Dr. Paulita Cradle for chronic pain, fibromyalgia and headache. Her cardiologist is Dr Elvera Maria. Surgical History: Past Surgical History  Procedure Laterality Date  . Tonsillectomy    . Abdominal hysterectomy    . Total knee arthroplasty  2002    Right; previous arthroscopic surgery  . Shoulder arthroscopy      Right; bone spurs removed  . Lung biopsy      negative  . 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 Right oophorectomy      benign disease  . Colonoscopy  06/2008, 06/2011    sigmoid tics, tubular adenoma; 2013: anal canal  hemorrhoids  . Eus  08/2010    Dr [facility] with EGD. Retained food. No recurrent esophageal lesion, bx negative.  . Colonoscopy  07/10/2011    Anal canal hemorrhoids likely the cause of hematochezia in the setting of constipation; otherwise normal rectum ;submucosal  petechiae in left colon of doubtful clinical significance; otherwise, normal colon  . Esophagogastroduodenoscopy  12/11/2011    IRJ:JOACZYSAYTK Schatzki's ring; otherwise normal/Small hiatal hernia. Antral and bulbar erosions  . Bravo ph study  12/11/2011    Procedure: BRAVO [place] STUDY;  Surgeon: Daneil Dolin, MD;  Location: [facility] SUITE;  Service: Endoscopy;  Laterality: N/A;  . Cholecystectomy N/A 04/09/2013    Procedure: LAPAROSCOPIC CHOLECYSTECTOMY;  Surgeon: Jamesetta So, MD;  Location: [facility] ORS;  Service: General;  Laterality: N/A;   Family History: family history includes Alcohol abuse in her father; Anxiety disorder in her mother; Colon cancer in her paternal aunt and paternal uncle; Dementia in her maternal uncle; Depression in her mother; Heart attack in her mother; Stroke in her father. There is no history of ADD / ADHD, Bipolar disorder, Drug abuse, OCD, Paranoid behavior, Schizophrenia, Seizures, Sexual abuse, or Physical abuse. Reviewed and nothing new today again.  Mental status examination Patient is casually dressed and fairly groomed. She is calm cooperative and maintained fair eye contact. She described her mood as as depressed and her affect ist flat She still has chronic pain  Her speech is coherent but slow.  Her thought processes clear and logical. She denies suicidal ideation but no plans, no active or passive homicidal thoughts. She denies any auditory or visual hallucination. There no psychotic symptoms present at this time. She's alert and oriented x3. Her insight judgment impulse control is okay  Lab Results:  Results for orders placed or performed in visit on 01/11/14 (from the past 8736 hour(s))   Hepatic function panel   Collection Time: 01/11/14  4:14 PM  Result Value Ref Range   Total Bilirubin 0.5 0.2 - 1.2 mg/dL   Bilirubin, Direct 0.1 0.0 - 0.3 mg/dL   Indirect Bilirubin 0.4 0.2 - 1.2 mg/dL   Alkaline Phosphatase 56 39 - 117 U/L   AST 23 0 - 37 U/L   ALT 17 0 - 35 U/L   Total Protein 6.3 6.0 - 8.3 g/dL   Albumin 3.9 3.5 - 5.2 g/dL   Her cardiologist draws her labs and reportedly there are no problems.    Assessment Axis I Maj. depressive disorder Axis II deferred Axis III see medical history Axis IV are to moderate.  Plan: I took her vitals.  I reviewed CC, tobacco/med/surg Hx, meds effects/ side effects, problem list, therapies and responses as well as current situation/symptoms discussed options. She will discontinue Lexapro and start Viibryd dose pack ending and 40 mg,, continue Neurontin 300 mg 3 times a day and clonazepam 1 mg twice a day . She will discontinue Ambien 10 mg daily at bedtime and start Belsomra 20 mg at bedtime . She'll return in 6 weeks  See orders and pt instructions for more details.  MEDICATIONS this encounter: Meds ordered this encounter  Medications  . gabapentin (NEURONTIN) 300 MG capsule    Sig: Take 1 capsule (300 mg total) by mouth 3 (three) times daily.    Dispense:  90 capsule    Refill:  2  . Vilazodone HCl (VIIBRYD) 40 MG TABS    Sig: Take 1 tablet (40 mg total) by mouth daily.    Dispense:  30 tablet    Refill:  2  . clonazePAM (KLONOPIN) 1 MG tablet    Sig: Take 1 tablet (1 mg total) by mouth 2 (two) times daily.    Dispense:  60 tablet    Refill:  2  . zolpidem (AMBIEN) 10 MG tablet    Sig: Take 1 tablet (10 mg total) by mouth at bedtime.    Dispense:  30 tablet    Refill:  2  . Suvorexant (BELSOMRA) 20 MG TABS    Sig: Take 20 mg by mouth at bedtime.    Dispense:  30 tablet    Refill:  2    Medical Decision Making Problem Points:  Established problem, stable/improving (1), New problem, with additional work-up  planned (4), Review of last therapy session (1) and Review of psycho-social stressors (1) Data Points:  Review or order clinical lab tests (1) Review of new medications or change in dosage (2)  I certify that outpatient services furnished can reasonably be expected to improve the patient's condition.   Levonne Spiller, MD

## 2014-06-09 NOTE — Telephone Encounter (Signed)
Pt pharmacy states they tried to fill pt Belsomra and it's coming back as not covered and it does not say it needs prior auth. Pharmarcy would like to know what to do. Pharmacy number is [phone]

## 2014-06-10 ENCOUNTER — Ambulatory Visit (HOSPITAL_COMMUNITY): Payer: Self-pay | Admitting: Psychiatry

## 2014-06-14 ENCOUNTER — Encounter: Payer: Self-pay | Admitting: Orthopedic Surgery

## 2014-06-14 ENCOUNTER — Ambulatory Visit (INDEPENDENT_AMBULATORY_CARE_PROVIDER_SITE_OTHER): Payer: Medicare HMO | Admitting: Orthopedic Surgery

## 2014-06-14 VITALS — BP 147/78 | Ht 62.0 in | Wt 193.0 lb

## 2014-06-14 DIAGNOSIS — M7050 Other bursitis of knee, unspecified knee: Secondary | ICD-10-CM

## 2014-06-14 DIAGNOSIS — M7150 Other bursitis, not elsewhere classified, unspecified site: Secondary | ICD-10-CM

## 2014-06-14 MED ORDER — DICLOFENAC POTASSIUM 50 MG PO TABS: 50.0000 mg | ORAL_TABLET | Freq: Two times a day (BID) | ORAL | Status: DC

## 2014-06-14 NOTE — Telephone Encounter (Signed)
Called pt pharmacy and spoke the pharmacist and he stated that pt insurance will not pay for pt Belsomra due to them not participating with this medication. Per pharmacist, pt insurance are not asking or requiring Prior Auth, pt insurance will not pay for medication. Pt pharmacist to talk to provider to either switch pt to another medication or pt can just pay out of pocket for this medication. Called pt to inform her of what was going on. Informed pt that she could go on the [place] web-site to apply for financial assistance to still be receiving this medication. Pt stated she would rather just come to our office to keep picking up samples for now. Informed pt that we are limited on supply and pt showed understanding.

## 2014-06-14 NOTE — Progress Notes (Signed)
Chief Complaint  Patient presents with  . Follow-up    2 week recheck on bilateral knees after injections and medication.    Recheck both knees still having some bursitis pain recommend repeat injections  Procedure note  Injection  Verbal consent was obtained to inject the  pes bursa of the right and left knee  Timeout procedure was completed to confirm injection site  Diagnosis has bursitis  Medications used Depo-Medrol 40 mg 1 cc Lidocaine 1% plain 3 cc  Anesthesia was provided by ethyl chloride spray  Prep was performed with alcohol  Technique of injection  right knee and then left knee was injected with the medication as listed above with the following technique. The patient's knee was prepped with alcohol and ethyl chloride injection was performed right knee and repeated left knee  No complications were noted

## 2014-06-14 NOTE — Telephone Encounter (Signed)
Please give pts 2 weeks of samples

## 2014-06-21 NOTE — Telephone Encounter (Signed)
Pt is aware and shows understanding 

## 2014-06-28 ENCOUNTER — Ambulatory Visit: Payer: Self-pay | Admitting: Orthopedic Surgery

## 2014-07-06 ENCOUNTER — Telehealth (HOSPITAL_COMMUNITY): Payer: Self-pay | Admitting: *Deleted

## 2014-07-06 NOTE — Telephone Encounter (Signed)
Please route to [facility]

## 2014-07-06 NOTE — Telephone Encounter (Signed)
Patient is here to pick up medication, sample.   She don't know which one she is supposed to pick up.   [name] told her to come when she run out of the Ambien.

## 2014-07-07 ENCOUNTER — Encounter: Payer: Self-pay | Admitting: Orthopedic Surgery

## 2014-07-07 ENCOUNTER — Ambulatory Visit (INDEPENDENT_AMBULATORY_CARE_PROVIDER_SITE_OTHER): Payer: Medicare HMO | Admitting: Orthopedic Surgery

## 2014-07-07 VITALS — BP 140/84 | Ht 62.0 in | Wt 193.0 lb

## 2014-07-07 DIAGNOSIS — S8002XA Contusion of left knee, initial encounter: Secondary | ICD-10-CM

## 2014-07-07 NOTE — Progress Notes (Signed)
[name], female   DOB: Jan 10, 1946, 69 y.o.   MRN: [id number]  Chief Complaint  Patient presents with  . Follow-up    Left knee new injury, DOI 06/22/14 FALL- previously treated for bursitis 06/14/14 w/ injection    HPI [name] is a 69 y.o. female.  Who I have been following for bursitis of her knees who fell on December 30 after breast biopsy at [facility]. She felt the hospital was readmitted to the hospital after ER visit. At that time she had a CT of her head which showed no acute finding she had CT of the cervical spine which showed some degenerative disc disease she had x-rays of her knee which showed osteoarthritis with no acute fracture  She was eventually discharged. She comes in for follow-up. She complains of pain over the anteromedial aspect of her left knee. Swelling bruising over the same area. Knee motion seems intact and she is not requiring assistive device for ambulation  HPI  Review of Systems  System review she reports no dizziness no real weakness no fever chills. No dizziness tremors seizure activity.  Past Medical History  Diagnosis Date  . Hypertension   . Hyperlipidemia   . Gastroesophageal reflux disease   . Diverticulosis   . Anxiety and depression   . Adenomatous colon polyp 2006    excised in 2006 & 2010Due surveillance 06/2013  . Chronic back pain   . Thyroid nodule   . Fibromyalgia   . Migraines   . Insomnia   . Chronic constipation   . Asthma   . History of benign esophageal tumor 2010    granular cell esophageal tumor (Dx 06/2008), resected via EMR 2011, due repeat EGD 02/2012  . Chest discomfort   . Hiatal hernia   . Depression   . Anxiety   . Sleep apnea     Stop Bang score of 5. Pt said she was told by Dr. Merlene Laughter that she had "a little bit" of sleep apena, but not bad enough to treat.    Past Surgical History  Procedure Laterality Date  . Tonsillectomy    . Abdominal hysterectomy    . Total knee  arthroplasty  2002    Right; previous arthroscopic surgery  . Shoulder arthroscopy      Right; bone spurs removed  . Lung biopsy      negative  . 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 Right oophorectomy      benign disease  . Colonoscopy  06/2008, 06/2011    sigmoid tics, tubular adenoma; 2013: anal canal hemorrhoids  . Eus  08/2010    Dr [facility] with EGD. Retained food. No recurrent esophageal lesion, bx negative.  . Colonoscopy  07/10/2011    Anal canal hemorrhoids likely the cause of hematochezia in the setting of constipation; otherwise normal rectum ;submucosal  petechiae in left colon of doubtful clinical significance; otherwise, normal colon  . Esophagogastroduodenoscopy  12/11/2011    BPZ:WCHENIDPOEU Schatzki's ring; otherwise normal/Small hiatal hernia. Antral and bulbar erosions  . Bravo ph study  12/11/2011    Procedure: BRAVO [place] STUDY;  Surgeon: Daneil Dolin, MD;  Location: [facility];  Service: Endoscopy;  Laterality: N/A;  . Cholecystectomy N/A 04/09/2013    Procedure: LAPAROSCOPIC CHOLECYSTECTOMY;  Surgeon: Jamesetta So, MD;  Location: [facility] ORS;  Service: General;  Laterality: N/A;    Family History  Problem Relation Age of Onset  .  Stroke Father   . Alcohol abuse Father   . Heart attack Mother   . Depression Mother   . Anxiety disorder Mother   . Colon cancer Paternal Aunt   . Colon cancer Paternal Uncle   . Dementia Maternal Uncle   . ADD / ADHD Neg Hx   . Bipolar disorder Neg Hx   . Drug abuse Neg Hx   . OCD Neg Hx   . Paranoid behavior Neg Hx   . Schizophrenia Neg Hx   . Seizures Neg Hx   . Sexual abuse Neg Hx   . Physical abuse Neg Hx     Social History History  Substance Use Topics  . Smoking status: Never Smoker   . Smokeless tobacco: Never Used  . Alcohol Use: No    Allergies  Allergen Reactions  . Elavil [Amitriptyline] Other (See Comments)    Felt really nervous and felt like something  was hold her feet or arm and/ or AM headache on it and back on it and went away off it.   . Abilify [Aripiprazole] Other (See Comments)    Dystonic reaction  . Trazodone And Nefazodone Other (See Comments)    Brought back bad dreams of things in the past  . Codeine Hives, Nausea Only and Other (See Comments)    Feels funny  . Latex Hives  . Penicillin V Itching  . Penicillins Hives  . Polyethylene Glycol Other (See Comments)    Per allergy test  . Sulfonamide Derivatives Nausea And Vomiting  . Cymbalta [Duloxetine Hcl] Rash  . Remeron [Mirtazapine] Other (See Comments)    Caused lots of strange, weird, crazy dreams.    Current Outpatient Prescriptions  Medication Sig Dispense Refill  . albuterol (PROVENTIL HFA;VENTOLIN HFA) 108 (90 BASE) MCG/ACT inhaler Inhale 2 puffs into the lungs every 6 (six) hours as needed. For shortness of breath    . alendronate (FOSAMAX) 70 MG tablet 70 mg once a week.    Marland Kitchen amLODipine (NORVASC) 5 MG tablet Take 10 mg by mouth daily.     Marland Kitchen atenolol (TENORMIN) 50 MG tablet Take 1 tablet (50 mg total) by mouth 2 (two) times daily. 180 tablet 3  . BOTOX 200 UNITS SOLR Inject 200 Units into the skin every 3 (three) months.    . clonazePAM (KLONOPIN) 1 MG tablet Take 1 tablet (1 mg total) by mouth 2 (two) times daily. 60 tablet 2  . dexlansoprazole (DEXILANT) 60 MG capsule Take 1 capsule (60 mg total) by mouth daily. 30 capsule 11  . diclofenac (CATAFLAM) 50 MG tablet Take 1 tablet (50 mg total) by mouth 2 (two) times daily. 60 tablet 1  . doxycycline (VIBRA-TABS) 100 MG tablet Take 100 mg by mouth 2 (two) times daily.    Marland Kitchen gabapentin (NEURONTIN) 300 MG capsule Take 1 capsule (300 mg total) by mouth 3 (three) times daily. 90 capsule 2  . HYDROcodone-acetaminophen (NORCO) 10-325 MG per tablet Take 1 tablet by mouth every 4 (four) hours as needed. 180 tablet 0  . levocetirizine (XYZAL) 5 MG tablet Take 5 mg by mouth as needed.     . lovastatin (MEVACOR) 40 MG tablet  Take 1 tablet (40 mg total) by mouth at bedtime. 90 tablet 3  . lubiprostone (AMITIZA) 8 MCG capsule Take 1 capsule (8 mcg total) by mouth 2 (two) times daily with a meal. 60 capsule 11  . metroNIDAZOLE (METROGEL) 0.75 % gel Apply 1 application topically 2 (two) times daily.    Marland Kitchen  mupirocin ointment (BACTROBAN) 2 % Place 1 application into the nose 2 (two) times daily.    Marland Kitchen nystatin ointment (MYCOSTATIN) Apply 1 application topically 2 (two) times daily.    . ondansetron (ZOFRAN) 4 MG tablet Take 4 mg by mouth every 8 (eight) hours as needed.     . potassium chloride SA (K-DUR,KLOR-CON) 20 MEQ tablet Take 20 mEq by mouth once.     . Suvorexant (BELSOMRA) 20 MG TABS Take 20 mg by mouth at bedtime. 30 tablet 2  . traMADol (ULTRAM) 50 MG tablet Take 50 mg by mouth every 6 (six) hours as needed. For pain    . Vilazodone HCl (VIIBRYD) 40 MG TABS Take 1 tablet (40 mg total) by mouth daily. 30 tablet 2  . Vitamin D, Ergocalciferol, (DRISDOL) 50000 UNITS CAPS capsule Take 50,000 Units by mouth every 7 (seven) days.    Marland Kitchen zolpidem (AMBIEN) 10 MG tablet Take 1 tablet (10 mg total) by mouth at bedtime. 30 tablet 2   No current facility-administered medications for this visit.       Physical Exam Blood pressure 140/84, height 5\' 2"  (1.575 m), weight 193 lb (87.544 kg). Physical Exam No gross deformities. She is ambulatory without assistive device. She's oriented 3. Her mood is flatter affect is flat. She has palpable tenderness over the medial femoral condyle medial joint line and proximal tibia. Knee flexion is free and easy without restriction. Mild pain. Knee stable in anterior posterior plane and medial collateral plane. Motor exam is normal skin is bruised but intact over the medial portion of her leg. She has some distal peripheral edema which is chronic but has normal perfusion to the limb sensation remains intact   Data Reviewed Reports of her knee x-rays CT scans are included and they are  negative except for the osteoarthritis which is chronic she had a left tib-fib x-ray as well which was negative she has some calcific tendinitis noted in the Achilles tendon. We do not have the x-rays because they are on the disc and the computer software will not allow visualization. I find no reason to repeat these x-rays as reports are negative and a clinical exam does not warrant repeating film.  Assessment    Encounter Diagnosis  Name Primary?  . Knee contusion, left, initial encounter Yes   I will see any structural damage. She does seem to have a contusion. I'll reexamine her knee next week on her regular scheduled appointment     Plan    Follow-up next week repeat exam determine if MRI necessary.

## 2014-07-07 NOTE — Telephone Encounter (Signed)
Pt was instructed to come to office to get samples of her Belsomra due to pt insurance not covering this medication and pt rejecting to fill out pt assistant form for this medication. Per pt she will just come to office to pick up samples for now. Called pt phone at 8:12am 07-07-14 but it was giving a busy tone and was unable to leave message.

## 2014-07-07 NOTE — Patient Instructions (Signed)
Keep already scheduled follow up appointment next week

## 2014-07-11 ENCOUNTER — Telehealth (HOSPITAL_COMMUNITY): Payer: Self-pay | Admitting: *Deleted

## 2014-07-11 NOTE — Telephone Encounter (Signed)
Per pt, she is unable to afford her co-pay for her Belsomra ($75). Pt Dr. Harrington Challenger to give pt 2-3 samples of Belsomra  And have pt apply for pt assistance for this medication. Per pt she will come to office 07-11-14 morning to pick samples up and will try to apply for pt assistance for this medication

## 2014-07-11 NOTE — Telephone Encounter (Signed)
Per Dr. Harrington Challenger to give pick at least 2-3 packs of Belsomra due to pt request and to inform pt that she would need to apply for pt assistant to get medication for free or at a reduced rate. Per pt she can not afford her co-pay for this medication ($75). Pt came in 07-11-14 to pick up samples for Belsomra. Pt D/L number is [id number]. Pt assistance form was printed for pt and given to her and pt agreed to bring completed form to office when she completes her portion of the form she will bring it back to office for office to submit the form.

## 2014-07-12 ENCOUNTER — Encounter: Payer: Self-pay | Admitting: Orthopedic Surgery

## 2014-07-12 ENCOUNTER — Ambulatory Visit (INDEPENDENT_AMBULATORY_CARE_PROVIDER_SITE_OTHER): Payer: Medicare HMO | Admitting: Orthopedic Surgery

## 2014-07-12 VITALS — BP 136/81 | Ht 62.0 in | Wt 193.0 lb

## 2014-07-12 DIAGNOSIS — S8002XD Contusion of left knee, subsequent encounter: Secondary | ICD-10-CM

## 2014-07-12 MED ORDER — HYDROCODONE-ACETAMINOPHEN 10-325 MG PO TABS: 1.0000 | ORAL_TABLET | ORAL | Status: DC | PRN

## 2014-07-12 NOTE — Progress Notes (Signed)
Chief Complaint  Patient presents with  . Follow-up    One-week follow-up after repeat injury to her left knee   History the patient fell after procedure over [facility] presents for reevaluation of left knee  Review of systems no numbness or tingling but persistent pain both knees although improving  She is ambulatory now without assistive devices no noticeable limp she still has tenderness over the medial aspect of her left knee and proximal tibia. She exhibits good range of motion in her left knee 0-1 20 stability tests were normal strength normal skin intact sensation intact  Right knee doing well post knee replacement without swelling or tenderness and full range of motion as been restored  Impression knee contusion  Follow-up one month continue hydrocodone 10 mg

## 2014-07-21 ENCOUNTER — Encounter (HOSPITAL_COMMUNITY): Payer: Self-pay | Admitting: Psychiatry

## 2014-07-21 ENCOUNTER — Ambulatory Visit (INDEPENDENT_AMBULATORY_CARE_PROVIDER_SITE_OTHER): Payer: Medicare HMO | Admitting: Psychiatry

## 2014-07-21 VITALS — BP 140/77 | HR 78 | Ht 62.0 in | Wt 202.0 lb

## 2014-07-21 DIAGNOSIS — F330 Major depressive disorder, recurrent, mild: Secondary | ICD-10-CM

## 2014-07-21 MED ORDER — ZOLPIDEM TARTRATE 10 MG PO TABS: 10.0000 mg | ORAL_TABLET | Freq: Every day | ORAL | Status: DC

## 2014-07-21 MED ORDER — GABAPENTIN 300 MG PO CAPS: 300.0000 mg | ORAL_CAPSULE | Freq: Three times a day (TID) | ORAL | Status: DC

## 2014-07-21 MED ORDER — VILAZODONE HCL 40 MG PO TABS: 40.0000 mg | ORAL_TABLET | Freq: Every day | ORAL | Status: DC

## 2014-07-21 MED ORDER — CLONAZEPAM 1 MG PO TABS: 1.0000 mg | ORAL_TABLET | Freq: Two times a day (BID) | ORAL | Status: DC

## 2014-07-21 NOTE — Progress Notes (Signed)
[name], female   DOB: 1946-01-06, 69 y.o.   MRN: [id number] [name], female   DOB: 1945/08/04, 69 y.o.   MRN: [id number] [name], female   DOB: 07/01/1945, 69 y.o.   MRN: [id number] [name], female   DOB: [birth year], 69 y.o.   MRN: [id number] [name], female   DOB: 1946-02-28, 69 y.o.   MRN: [id number] [name], female   DOB: [birth year], 69 y.o.   MRN: [id number] [name], female   DOB: [birth year], 69 y.o.   MRN: [id number] [name], female   DOB: 1945/08/28, 69 y.o.   MRN: [id number] [facility] [id number] Progress Note [name] MRN: [id number] DOB: September 24, 1945 Age: 69 y.o.  Date: 07/21/2014 Start Time: 9:45 AM End Time: 10:10 AM  Chief Complaint: Chief Complaint  Patient presents with  . Depression  . Anxiety  . Follow-up    Subjective: "I'm a little better "  This patient is a 69 year old black female who lives with her 41 year old daughter who has mild mental retardation. She lives in [place]. She is a retired [employer]. The patient states that she's been depressed "all my life.". She can't give any specific reasons for the depression but notes that at age 79 she was already taking medication to help with sleep. In her 77s her family doctor diagnosed with schizophrenia although she had no symptoms of paranoia or delusions or hallucinations. She was given medication for this.  Since 1997 she's been coming here with symptoms of depression. She's also had significant problems with fibromyalgia and fatigue. In Neurontin and Ambien have helped. Lately however she's become more depressed and worried. She has had gallbladder surgery and she's concerned about how is going to go. For some reason she's been thinking a lot about death. She doesn't have anyone to stay with her daughter something goes wrong. She's been more negative but is densely  not suicidal but her energy has dropped. In general she is a very functional person  The patient returns after 4 weeks. She's been having more medical issues. She has a benign tumor in her right breast and has to have part of the breast removed next week. When she went in for the biopsy she fell in the hospital afterwards and reinjured her left knee. She is still stressed about money issues. Nevertheless, she thinks the Viibryd is helping her mood. She could not get the Belsomra covered by her insurance and wants to go back to Ambien to help her sleep. She is applying for patient assistance for the [place]. She denies suicidal ideation  Current psychiatric medication Neurontin 300 mg 3 times a day Ambien 10 mg at bedtime Viibryd 40 mg daily Clonazepam 0.5 mg when necessary twice a day Vitals: BP 140/77 mmHg  Pulse 78  Ht 5\' 2"  (1.575 m)  Wt 202 lb (91.627 kg)  BMI 36.94 kg/m2  Psychiatric history Patient has a long history of depression.  She has been seeing in this office since May 1997.  Patient denies any history of suicidal attempt however endorse chronic depression and anxiety.  In the past she has taken Prozac but did not like after taking few doses, she also has taken Seroquel, Abilify, Xanax, Pamelor, and amitriptyline.  Patient denies a history of paranoia or delusions however there are times when she has been very depressed and have a lot of  negative symptoms.  Alcohol and substance use history Patient denies any history of alcohol or substance use.  Allergies: Allergies  Allergen Reactions  . Elavil [Amitriptyline] Other (See Comments)    Felt really nervous and felt like something was hold her feet or arm and/ or AM headache on it and back on it and went away off it.   . Abilify [Aripiprazole] Other (See Comments)    Dystonic reaction  . Trazodone And Nefazodone Other (See Comments)    Brought back bad dreams of things in the past  . Codeine Hives, Nausea Only and Other  (See Comments)    Feels funny  . Latex Hives  . Penicillin V Itching  . Penicillins Hives  . Polyethylene Glycol Other (See Comments)    Per allergy test  . Sulfonamide Derivatives Nausea And Vomiting  . Cymbalta [Duloxetine Hcl] Rash  . Remeron [Mirtazapine] Other (See Comments)    Caused lots of strange, weird, crazy dreams.   Medical History: Past Medical History  Diagnosis Date  . Hypertension   . Hyperlipidemia   . Gastroesophageal reflux disease   . Diverticulosis   . Anxiety and depression   . Adenomatous colon polyp 2006    excised in 2006 & 2010Due surveillance 06/2013  . Chronic back pain   . Thyroid nodule   . Fibromyalgia   . Migraines   . Insomnia   . Chronic constipation   . Asthma   . History of benign esophageal tumor 2010    granular cell esophageal tumor (Dx 06/2008), resected via EMR 2011, due repeat EGD 02/2012  . Chest discomfort   . Hiatal hernia   . Depression   . Anxiety   . Sleep apnea     Stop Bang score of 5. Pt said she was told by Dr. Merlene Laughter that she had "a little bit" of sleep apena, but not bad enough to treat.  . Breast tumor   Patient has history of hypertension, hyperlipidemia, GERD, diverticulosis, colonic polyp, chronic back pain, fibromyalgia, chronic constipation, benign tumor of esophagus and chronic headache.  Her primary care physician is Dr. Legrand Rams, she sees Dr. Paulita Cradle for chronic pain, fibromyalgia and headache. Her cardiologist is Dr Elvera Maria. Surgical History: Past Surgical History  Procedure Laterality Date  . Tonsillectomy    . Abdominal hysterectomy    . Total knee arthroplasty  2002    Right; previous arthroscopic surgery  . Shoulder arthroscopy      Right; bone spurs removed  . Lung biopsy      negative  . 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 Right oophorectomy      benign disease  . Colonoscopy  06/2008, 06/2011    sigmoid tics, tubular adenoma; 2013: anal canal  hemorrhoids  . Eus  08/2010    Dr [facility] with EGD. Retained food. No recurrent esophageal lesion, bx negative.  . Colonoscopy  07/10/2011    Anal canal hemorrhoids likely the cause of hematochezia in the setting of constipation; otherwise normal rectum ;submucosal  petechiae in left colon of doubtful clinical significance; otherwise, normal colon  . Esophagogastroduodenoscopy  12/11/2011    LKG:[id number] Schatzki's ring; otherwise normal/Small hiatal hernia. Antral and bulbar erosions  . Bravo ph study  12/11/2011    Procedure: BRAVO [place] STUDY;  Surgeon: Daneil Dolin, MD;  Location: [facility];  Service: Endoscopy;  Laterality: N/A;  . Cholecystectomy N/A 04/09/2013    Procedure: LAPAROSCOPIC CHOLECYSTECTOMY;  Surgeon: Jamesetta So, MD;  Location: [facility] ORS;  Service: General;  Laterality: N/A;   Family History: family history includes Alcohol abuse in her father; Anxiety disorder in her mother; Colon cancer in her paternal aunt and paternal uncle; Dementia in her maternal uncle; Depression in her mother; Heart attack in her mother; Stroke in her father. There is no history of ADD / ADHD, Bipolar disorder, Drug abuse, OCD, Paranoid behavior, Schizophrenia, Seizures, Sexual abuse, or Physical abuse. Reviewed and nothing new today again.  Mental status examination Patient is casually dressed and fairly groomed. She is calm cooperative and maintained fair eye contact. She described her mood as somewhat depressed but improved and her affect remains flat She still has chronic pain  Her speech is coherent but slow.  Her thought processes clear and logical. She denies suicidal ideation but no plans, no active or passive homicidal thoughts. She denies any auditory or visual hallucination. There no psychotic symptoms present at this time. She's alert and oriented x3. Her insight judgment impulse control is okay  Lab Results:  Results for orders placed or performed in visit on 01/11/14 (from the past  8736 hour(s))  Hepatic function panel   Collection Time: 01/11/14  4:14 PM  Result Value Ref Range   Total Bilirubin 0.5 0.2 - 1.2 mg/dL   Bilirubin, Direct 0.1 0.0 - 0.3 mg/dL   Indirect Bilirubin 0.4 0.2 - 1.2 mg/dL   Alkaline Phosphatase 56 39 - 117 U/L   AST 23 0 - 37 U/L   ALT 17 0 - 35 U/L   Total Protein 6.3 6.0 - 8.3 g/dL   Albumin 3.9 3.5 - 5.2 g/dL   Her cardiologist draws her labs and reportedly there are no problems.    Assessment Axis I Maj. depressive disorder Axis II deferred Axis III see medical history Axis IV are to moderate.  Plan: I took her vitals.  I reviewed CC, tobacco/med/surg Hx, meds effects/ side effects, problem list, therapies and responses as well as current situation/symptoms discussed options. She will continue Viibryd  40 mg,, continue Neurontin 300 mg 3 times a day and clonazepam 1 mg twice a day . She will continue Ambien 10 mg daily at bedtime for now. She'll return in 2 months  See orders and pt instructions for more details.  MEDICATIONS this encounter: Meds ordered this encounter  Medications  . gabapentin (NEURONTIN) 300 MG capsule    Sig: Take 1 capsule (300 mg total) by mouth 3 (three) times daily.    Dispense:  90 capsule    Refill:  2  . Vilazodone HCl (VIIBRYD) 40 MG TABS    Sig: Take 1 tablet (40 mg total) by mouth daily.    Dispense:  30 tablet    Refill:  2  . clonazePAM (KLONOPIN) 1 MG tablet    Sig: Take 1 tablet (1 mg total) by mouth 2 (two) times daily.    Dispense:  60 tablet    Refill:  2  . zolpidem (AMBIEN) 10 MG tablet    Sig: Take 1 tablet (10 mg total) by mouth at bedtime.    Dispense:  30 tablet    Refill:  2    Medical Decision Making Problem Points:  Established problem, stable/improving (1), New problem, with additional work-up planned (4), Review of last therapy session (1) and Review of psycho-social stressors (1) Data Points:  Review or order clinical lab tests (1) Review of new medications or change  in dosage (2)  I certify that outpatient services furnished can reasonably be expected to improve the patient's condition.   Levonne Spiller, MD

## 2014-08-05 ENCOUNTER — Other Ambulatory Visit (HOSPITAL_COMMUNITY): Payer: Self-pay | Admitting: Psychiatry

## 2014-08-11 ENCOUNTER — Telehealth (HOSPITAL_COMMUNITY): Payer: Self-pay | Admitting: *Deleted

## 2014-08-16 ENCOUNTER — Ambulatory Visit: Payer: Medicare HMO | Admitting: Orthopedic Surgery

## 2014-08-23 ENCOUNTER — Encounter: Payer: Self-pay | Admitting: Orthopedic Surgery

## 2014-08-23 ENCOUNTER — Ambulatory Visit: Payer: Medicare HMO | Admitting: Orthopedic Surgery

## 2014-08-29 ENCOUNTER — Encounter: Payer: Self-pay | Admitting: Orthopedic Surgery

## 2014-08-29 ENCOUNTER — Ambulatory Visit (INDEPENDENT_AMBULATORY_CARE_PROVIDER_SITE_OTHER): Payer: Medicare HMO | Admitting: Orthopedic Surgery

## 2014-08-29 VITALS — BP 107/65 | Ht 62.0 in | Wt 193.0 lb

## 2014-08-29 DIAGNOSIS — M48061 Spinal stenosis, lumbar region without neurogenic claudication: Secondary | ICD-10-CM

## 2014-08-29 DIAGNOSIS — M7050 Other bursitis of knee, unspecified knee: Secondary | ICD-10-CM

## 2014-08-29 DIAGNOSIS — M1712 Unilateral primary osteoarthritis, left knee: Secondary | ICD-10-CM

## 2014-08-29 DIAGNOSIS — M7150 Other bursitis, not elsewhere classified, unspecified site: Secondary | ICD-10-CM

## 2014-08-29 DIAGNOSIS — S8002XD Contusion of left knee, subsequent encounter: Secondary | ICD-10-CM

## 2014-08-29 DIAGNOSIS — M4806 Spinal stenosis, lumbar region: Secondary | ICD-10-CM

## 2014-08-29 MED ORDER — METHYLPREDNISOLONE ACETATE 80 MG/ML IJ SUSP: 80.0000 mg | Freq: Once | INTRAMUSCULAR | Status: DC

## 2014-08-29 MED ORDER — METHYLPREDNISOLONE ACETATE 40 MG/ML IJ SUSP
80.0000 mg | Freq: Once | INTRAMUSCULAR | Status: AC
  Administered 2014-08-29: 80 mg via INTRAMUSCULAR

## 2014-08-29 NOTE — Progress Notes (Signed)
[name], female   DOB: 11/14/1945, 69 y.o.   MRN: [id number]  Chief Complaint  Patient presents with  . Follow-up    1 month follow up bilateral knees    HPI [name] is a 69 y.o. female.  This patient fell in his had subsequent and frequent falls over the last years with a history of lumbar spinal stenosis diagnosed in 2006 with MRI. Left leg will give way on her at times. She has back pain she says is stable  I'm concerned that her giving way symptoms are lumbar-related. Her knee contusion has subsequently resolved her bursitis has improved with injections     Past Medical History  Diagnosis Date  . Hypertension   . Hyperlipidemia   . Gastroesophageal reflux disease   . Diverticulosis   . Anxiety and depression   . Adenomatous colon polyp 2006    excised in 2006 & 2010Due surveillance 06/2013  . Chronic back pain   . Thyroid nodule   . Fibromyalgia   . Migraines   . Insomnia   . Chronic constipation   . Asthma   . History of benign esophageal tumor 2010    granular cell esophageal tumor (Dx 06/2008), resected via EMR 2011, due repeat EGD 02/2012  . Chest discomfort   . Hiatal hernia   . Depression   . Anxiety   . Sleep apnea     Stop Bang score of 5. Pt said she was told by Dr. Merlene Laughter that she had "a little bit" of sleep apena, but not bad enough to treat.  . Breast tumor     Past Surgical History  Procedure Laterality Date  . Tonsillectomy    . Abdominal hysterectomy    . Total knee arthroplasty  2002    Right; previous arthroscopic surgery  . Shoulder arthroscopy      Right; bone spurs removed  . Lung biopsy      negative  . 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 Right oophorectomy      benign disease  . Colonoscopy  06/2008, 06/2011    sigmoid tics, tubular adenoma; 2013: anal canal hemorrhoids  . Eus  08/2010    Dr [facility] with EGD. Retained food. No recurrent esophageal lesion, bx  negative.  . Colonoscopy  07/10/2011    Anal canal hemorrhoids likely the cause of hematochezia in the setting of constipation; otherwise normal rectum ;submucosal  petechiae in left colon of doubtful clinical significance; otherwise, normal colon  . Esophagogastroduodenoscopy  12/11/2011    [id number] Schatzki's ring; otherwise normal/Small hiatal hernia. Antral and bulbar erosions  . Bravo ph study  12/11/2011    Procedure: BRAVO [place] STUDY;  Surgeon: Daneil Dolin, MD;  Location: [facility];  Service: Endoscopy;  Laterality: N/A;  . Cholecystectomy N/A 04/09/2013    Procedure: LAPAROSCOPIC CHOLECYSTECTOMY;  Surgeon: Jamesetta So, MD;  Location: [facility];  Service: General;  Laterality: N/A;     Allergies  Allergen Reactions  . Elavil [Amitriptyline] Other (See Comments)    Felt really nervous and felt like something was hold her feet or arm and/ or AM headache on it and back on it and went away off it.   . Abilify [Aripiprazole] Other (See Comments)    Dystonic reaction  . Trazodone And Nefazodone Other (See Comments)    Brought back bad dreams of things in the past  . Codeine Hives, Nausea Only  and Other (See Comments)    Feels funny  . Latex Hives  . Penicillin V Itching  . Penicillins Hives  . Polyethylene Glycol Other (See Comments)    Per allergy test  . Sulfonamide Derivatives Nausea And Vomiting  . Cymbalta [Duloxetine Hcl] Rash  . Remeron [Mirtazapine] Other (See Comments)    Caused lots of strange, weird, crazy dreams.    Current Outpatient Prescriptions  Medication Sig Dispense Refill  . albuterol (PROVENTIL HFA;VENTOLIN HFA) 108 (90 BASE) MCG/ACT inhaler Inhale 2 puffs into the lungs every 6 (six) hours as needed. For shortness of breath    . alendronate (FOSAMAX) 70 MG tablet 70 mg once a week.    Marland Kitchen amLODipine (NORVASC) 5 MG tablet Take 10 mg by mouth daily.     Marland Kitchen atenolol (TENORMIN) 50 MG tablet Take 1 tablet (50 mg total) by mouth 2 (two) times daily. 180  tablet 3  . BOTOX 200 UNITS SOLR Inject 200 Units into the skin every 3 (three) months.    . clonazePAM (KLONOPIN) 1 MG tablet Take 1 tablet (1 mg total) by mouth 2 (two) times daily. 60 tablet 2  . dexlansoprazole (DEXILANT) 60 MG capsule Take 1 capsule (60 mg total) by mouth daily. 30 capsule 11  . diclofenac (CATAFLAM) 50 MG tablet Take 1 tablet (50 mg total) by mouth 2 (two) times daily. 60 tablet 1  . escitalopram (LEXAPRO) 20 MG tablet Take 20 mg by mouth daily.    Marland Kitchen gabapentin (NEURONTIN) 300 MG capsule Take 1 capsule (300 mg total) by mouth 3 (three) times daily. 90 capsule 2  . HYDROcodone-acetaminophen (NORCO) 10-325 MG per tablet Take 1 tablet by mouth every 4 (four) hours as needed. 180 tablet 0  . lisinopril-hydrochlorothiazide (PRINZIDE,ZESTORETIC) 20-25 MG per tablet Take 1 tablet by mouth daily.    Marland Kitchen lovastatin (MEVACOR) 40 MG tablet Take 1 tablet (40 mg total) by mouth at bedtime. 90 tablet 3  . lubiprostone (AMITIZA) 8 MCG capsule Take 1 capsule (8 mcg total) by mouth 2 (two) times daily with a meal. 60 capsule 11  . meclizine (ANTIVERT) 25 MG tablet Take 25 mg by mouth 3 (three) times daily as needed for dizziness.    . mupirocin ointment (BACTROBAN) 2 % Place 1 application into the nose 2 (two) times daily.    Marland Kitchen nystatin ointment (MYCOSTATIN) Apply 1 application topically 2 (two) times daily.    . Olopatadine HCl 0.2 % SOLN Apply to eye.    . potassium chloride SA (K-DUR,KLOR-CON) 20 MEQ tablet Take 20 mEq by mouth once.     . promethazine (PHENERGAN) 25 MG tablet Take 25 mg by mouth every 6 (six) hours as needed for nausea or vomiting.    . traMADol (ULTRAM) 50 MG tablet Take 50 mg by mouth every 6 (six) hours as needed. For pain    . Vilazodone HCl (VIIBRYD) 40 MG TABS Take 1 tablet (40 mg total) by mouth daily. 30 tablet 2  . Vitamin D, Ergocalciferol, (DRISDOL) 50000 UNITS CAPS capsule Take 50,000 Units by mouth every 7 (seven) days.    Marland Kitchen zolpidem (AMBIEN) 10 MG tablet  Take 1 tablet (10 mg total) by mouth at bedtime. 30 tablet 2  . Suvorexant (BELSOMRA) 20 MG TABS Take 20 mg by mouth at bedtime. (Patient not taking: Reported on 08/29/2014) 30 tablet 2   No current facility-administered medications for this visit.    Review of Systems Review of Systems  Again frequent lumbar spine pain  with giving way symptoms left leg knee will collapse she has generalized aches and pains could not get out of bed last week for couple of days. She is maxed out on hydrocodone 10 mg at this point and cannot have any further narcotic pain medication  Physical Exam Blood pressure 107/65, height 5\' 2"  (1.575 m), weight 193 lb (87.544 kg). Physical Exam  Constitutional: She is oriented to person, place, and time. She appears well-developed and well-nourished. No distress.  Musculoskeletal:  She has bilateral peripheral edema She has tenderness lumbar spine and both SI joints left buttock  Do not detect weakness or sensory changes in either leg  Neurological: She is oriented to person, place, and time.  Skin: Skin is warm and dry.  Psychiatric: She has a normal mood and affect. Her behavior is normal.  Vitals reviewed.   Data Reviewed MRI from 2006 that report indicates she had spinal stenosis Bactroban  Assessment    Encounter Diagnoses  Name Primary?  . Knee contusion, left, subsequent encounter   . Primary osteoarthritis of left knee   . Pes anserinus bursitis   . Spinal stenosis of lumbar region Yes        Plan    I think she will need her lumbar spine worked up. I gave her 80 mg Depo-Medrol IM shot today to help with her generalized inflammation. Again she is maxed out on Norco's 10 mg  Recommend lumbar spine x-ray when she comes back       [employer] 08/29/2014, 10:36 AM

## 2014-08-29 NOTE — Addendum Note (Signed)
Addended by: Baldomero Lamy B on: 08/29/2014 10:57 AM   Modules accepted: Orders

## 2014-08-30 ENCOUNTER — Encounter: Payer: Self-pay | Admitting: Cardiology

## 2014-08-30 ENCOUNTER — Ambulatory Visit (INDEPENDENT_AMBULATORY_CARE_PROVIDER_SITE_OTHER): Payer: Medicare HMO | Admitting: Cardiology

## 2014-08-30 VITALS — BP 132/74 | HR 67 | Ht 62.0 in | Wt 212.0 lb

## 2014-08-30 DIAGNOSIS — R600 Localized edema: Secondary | ICD-10-CM

## 2014-08-30 DIAGNOSIS — R0602 Shortness of breath: Secondary | ICD-10-CM

## 2014-08-30 DIAGNOSIS — R42 Dizziness and giddiness: Secondary | ICD-10-CM

## 2014-08-30 MED ORDER — FUROSEMIDE 40 MG PO TABS: ORAL_TABLET | ORAL | Status: DC

## 2014-08-30 NOTE — Progress Notes (Signed)
Clinical Summary Ms. Bouton is a 69 y.o.female seen today for follow up of the following medical problems.   1. Lightheadedness/Dizziness  - started approx 1 year ago. Feeling of lightheadedness, but at times can feel like the room is spinning. Can happen laying down, sitting down, or standing. No association w/ exertion.Sometimes brought on by rolling over in bed. - event monitor shows just mild sinus brady which is her baseline  - she was orthostatic at last visit. We stopped her terazosin and encouraged increased oral intake which she has done. We also decreased her atenolol to 50mg  bid, she had been mistakingly taking 50mg  in AM and 100mg  in pm  - since last visit continues to have dizziness. 2 falling episodes but both were mechnical.   2. LE edema - bilateral, started around December - not improved with HCTZ started by her pcp   Past Medical History  Diagnosis Date  . Hypertension   . Hyperlipidemia   . Gastroesophageal reflux disease   . Diverticulosis   . Anxiety and depression   . Adenomatous colon polyp 2006    excised in 2006 & 2010Due surveillance 06/2013  . Chronic back pain   . Thyroid nodule   . Fibromyalgia   . Migraines   . Insomnia   . Chronic constipation   . Asthma   . History of benign esophageal tumor 2010    granular cell esophageal tumor (Dx 06/2008), resected via EMR 2011, due repeat EGD 02/2012  . Chest discomfort   . Hiatal hernia   . Depression   . Anxiety   . Sleep apnea     Stop Bang score of 5. Pt said she was told by Dr. Merlene Laughter that she had "a little bit" of sleep apena, but not bad enough to treat.  . Breast tumor      Allergies  Allergen Reactions  . Elavil [Amitriptyline] Other (See Comments)    Felt really nervous and felt like something was hold her feet or arm and/ or AM headache on it and back on it and went away off it.   . Abilify [Aripiprazole] Other (See Comments)    Dystonic reaction  . Trazodone And Nefazodone  Other (See Comments)    Brought back bad dreams of things in the past  . Codeine Hives, Nausea Only and Other (See Comments)    Feels funny  . Latex Hives  . Penicillin V Itching  . Penicillins Hives  . Polyethylene Glycol Other (See Comments)    Per allergy test  . Sulfonamide Derivatives Nausea And Vomiting  . Cymbalta [Duloxetine Hcl] Rash  . Remeron [Mirtazapine] Other (See Comments)    Caused lots of strange, weird, crazy dreams.     Current Outpatient Prescriptions  Medication Sig Dispense Refill  . albuterol (PROVENTIL HFA;VENTOLIN HFA) 108 (90 BASE) MCG/ACT inhaler Inhale 2 puffs into the lungs every 6 (six) hours as needed. For shortness of breath    . alendronate (FOSAMAX) 70 MG tablet 70 mg once a week.    Marland Kitchen atenolol (TENORMIN) 50 MG tablet Take 1 tablet (50 mg total) by mouth 2 (two) times daily. 180 tablet 3  . BOTOX 200 UNITS SOLR Inject 200 Units into the skin every 3 (three) months.    . clonazePAM (KLONOPIN) 1 MG tablet Take 1 tablet (1 mg total) by mouth 2 (two) times daily. 60 tablet 2  . dexlansoprazole (DEXILANT) 60 MG capsule Take 1 capsule (60 mg total) by mouth daily. (  Patient taking differently: Take 60 mg by mouth 2 (two) times daily. ) 30 capsule 11  . diclofenac (CATAFLAM) 50 MG tablet Take 1 tablet (50 mg total) by mouth 2 (two) times daily. 60 tablet 1  . gabapentin (NEURONTIN) 300 MG capsule Take 1 capsule (300 mg total) by mouth 3 (three) times daily. 90 capsule 2  . HYDROcodone-acetaminophen (NORCO) 10-325 MG per tablet Take 1 tablet by mouth every 4 (four) hours as needed. 180 tablet 0  . lisinopril-hydrochlorothiazide (PRINZIDE,ZESTORETIC) 20-25 MG per tablet Take 1 tablet by mouth daily.    Marland Kitchen lovastatin (MEVACOR) 40 MG tablet Take 1 tablet (40 mg total) by mouth at bedtime. 90 tablet 3  . lubiprostone (AMITIZA) 8 MCG capsule Take 1 capsule (8 mcg total) by mouth 2 (two) times daily with a meal. 60 capsule 11  . meclizine (ANTIVERT) 25 MG tablet Take  25 mg by mouth 3 (three) times daily as needed for dizziness.    . nystatin ointment (MYCOSTATIN) Apply 1 application topically 2 (two) times daily.    . Olopatadine HCl 0.2 % SOLN Apply to eye.    . potassium chloride SA (K-DUR,KLOR-CON) 20 MEQ tablet Take 20 mEq by mouth once.     . promethazine (PHENERGAN) 25 MG tablet Take 25 mg by mouth every 6 (six) hours as needed for nausea or vomiting.    . traMADol (ULTRAM) 50 MG tablet Take 50 mg by mouth every 6 (six) hours as needed. For pain    . Vilazodone HCl (VIIBRYD) 40 MG TABS Take 1 tablet (40 mg total) by mouth daily. 30 tablet 2  . Vitamin D, Ergocalciferol, (DRISDOL) 50000 UNITS CAPS capsule Take 50,000 Units by mouth every 30 (thirty) days.     Marland Kitchen zolpidem (AMBIEN) 10 MG tablet Take 1 tablet (10 mg total) by mouth at bedtime. 30 tablet 2  . amLODipine (NORVASC) 5 MG tablet Take 10 mg by mouth daily.      No current facility-administered medications for this visit.     Past Surgical History  Procedure Laterality Date  . Tonsillectomy    . Abdominal hysterectomy    . Total knee arthroplasty  2002    Right; previous arthroscopic surgery  . Shoulder arthroscopy      Right; bone spurs removed  . Lung biopsy      negative  . 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 Right oophorectomy      benign disease  . Colonoscopy  06/2008, 06/2011    sigmoid tics, tubular adenoma; 2013: anal canal hemorrhoids  . Eus  08/2010    Dr [facility] with EGD. Retained food. No recurrent esophageal lesion, bx negative.  . Colonoscopy  07/10/2011    Anal canal hemorrhoids likely the cause of hematochezia in the setting of constipation; otherwise normal rectum ;submucosal  petechiae in left colon of doubtful clinical significance; otherwise, normal colon  . Esophagogastroduodenoscopy  12/11/2011    [id number] Schatzki's ring; otherwise normal/Small hiatal hernia. Antral and bulbar erosions  . Bravo ph study   12/11/2011    Procedure: BRAVO [place] STUDY;  Surgeon: Daneil Dolin, MD;  Location: [facility];  Service: Endoscopy;  Laterality: N/A;  . Cholecystectomy N/A 04/09/2013    Procedure: LAPAROSCOPIC CHOLECYSTECTOMY;  Surgeon: Jamesetta So, MD;  Location: [facility];  Service: General;  Laterality: N/A;     Allergies  Allergen Reactions  . Elavil [Amitriptyline] Other (See Comments)    Felt  really nervous and felt like something was hold her feet or arm and/ or AM headache on it and back on it and went away off it.   . Abilify [Aripiprazole] Other (See Comments)    Dystonic reaction  . Trazodone And Nefazodone Other (See Comments)    Brought back bad dreams of things in the past  . Codeine Hives, Nausea Only and Other (See Comments)    Feels funny  . Latex Hives  . Penicillin V Itching  . Penicillins Hives  . Polyethylene Glycol Other (See Comments)    Per allergy test  . Sulfonamide Derivatives Nausea And Vomiting  . Cymbalta [Duloxetine Hcl] Rash  . Remeron [Mirtazapine] Other (See Comments)    Caused lots of strange, weird, crazy dreams.      Family History  Problem Relation Age of Onset  . Stroke Father   . Alcohol abuse Father   . Heart attack Mother   . Depression Mother   . Anxiety disorder Mother   . Colon cancer Paternal Aunt   . Colon cancer Paternal Uncle   . Dementia Maternal Uncle   . ADD / ADHD Neg Hx   . Bipolar disorder Neg Hx   . Drug abuse Neg Hx   . OCD Neg Hx   . Paranoid behavior Neg Hx   . Schizophrenia Neg Hx   . Seizures Neg Hx   . Sexual abuse Neg Hx   . Physical abuse Neg Hx      Social History Ms. Gauger reports that she has never smoked. She has never used smokeless tobacco. Ms. Storie reports that she does not drink alcohol.   Review of Systems CONSTITUTIONAL: No weight loss, fever, chills, weakness or fatigue.  HEENT: Eyes: No visual loss, blurred vision, double vision or yellow sclerae.No hearing loss, sneezing, congestion, runny  nose or sore throat.  SKIN: No rash or itching.  CARDIOVASCULAR: no chest pain, no palpitations RESPIRATORY: No shortness of breath, cough or sputum.  GASTROINTESTINAL: No anorexia, nausea, vomiting or diarrhea. No abdominal pain or blood.  GENITOURINARY: No burning on urination, no polyuria NEUROLOGICAL: +dizziness MUSCULOSKELETAL: No muscle, back pain, joint pain or stiffness.  LYMPHATICS: No enlarged nodes. No history of splenectomy.  PSYCHIATRIC: No history of depression or anxiety.  ENDOCRINOLOGIC: No reports of sweating, cold or heat intolerance. No polyuria or polydipsia.  Marland Kitchen   Physical Examination Filed Vitals:   08/30/14 0814  BP: 132/74  Pulse: 67   Filed Weights   08/30/14 0814  Weight: 212 lb (96.163 kg)    Gen: resting comfortably, no acute distress HEENT: no scleral icterus, pupils equal round and reactive, no palptable cervical adenopathy,  CV: RRR, no m/r/g, no JVD Resp: Clear to auscultation bilaterally GI: abdomen is soft, non-tender, non-distended, normal bowel sounds, no hepatosplenomegaly MSK: extremities are warm, 1+ bilateral edema  Skin: warm, no rash Neuro:  no focal deficits Psych: appropriate affect   Diagnostic Studies 01/21/13 EKG: sinus brady rate 54, no block, no ischemic changes   02/17/13 Event monitor: baseline sinus brady in 50s, symptoms of dizziness correlate w/ sinus brady in 50s. Occasional PVCs, 1 PVC couplet.   01/2012 Carotid US: minimal plaque at bifurcations, no stenosis   Pertinent labs 12/2012: Na 142 K 3.7 Cl 112 Cr 0.8    Assessment and Plan   1. Dizziness  - symptoms most suggestive of vertigo, as they are positional and often brought on with changing positions while laying down, feeling of room spinning - she  is no longer orthostatic. Bradycardia resolved with decreased atenolol. Prior monitor showed no arrhythmias. - overall no evidence of ongoing symptoms having a cardiac cause. Defer further workup to pcp  2. LE  edema - will obtain echo - given Rx for prn lasix 40mg     F/u 1 month    Arnoldo Lenis, M.D.

## 2014-08-30 NOTE — Patient Instructions (Signed)
Your physician recommends that you schedule a follow-up appointment in: 4 weeks with Jory Sims NP   START Lasix 40 mg daily as needed for swelling   Your physician has requested that you have an echocardiogram. Echocardiography is a painless test that uses sound waves to create images of your heart. It provides your doctor with information about the size and shape of your heart and how well your heart's chambers and valves are working. This procedure takes approximately one hour. There are no restrictions for this procedure.     Thank you for choosing [facility] !

## 2014-09-02 ENCOUNTER — Ambulatory Visit (HOSPITAL_COMMUNITY): Payer: Medicare HMO

## 2014-09-05 ENCOUNTER — Telehealth (HOSPITAL_COMMUNITY): Payer: Self-pay | Admitting: *Deleted

## 2014-09-05 NOTE — Telephone Encounter (Signed)
Prior authorization received for Ambien. Completed online with covermymeds.

## 2014-09-06 ENCOUNTER — Ambulatory Visit (HOSPITAL_COMMUNITY)
Admission: RE | Admit: 2014-09-06 | Discharge: 2014-09-06 | Disposition: A | Discharge status: Home / Self Care | Payer: Medicare HMO | Source: Ambulatory Visit | Attending: Cardiology | Admitting: Cardiology

## 2014-09-06 DIAGNOSIS — R0602 Shortness of breath: Secondary | ICD-10-CM | POA: Diagnosis present

## 2014-09-06 DIAGNOSIS — R0600 Dyspnea, unspecified: Secondary | ICD-10-CM

## 2014-09-06 NOTE — Progress Notes (Signed)
  Echocardiogram 2D Echocardiogram has been performed.  [name] 09/06/2014, 12:24 PM

## 2014-09-19 ENCOUNTER — Ambulatory Visit (HOSPITAL_COMMUNITY): Payer: Self-pay | Admitting: Psychiatry

## 2014-09-20 ENCOUNTER — Encounter (HOSPITAL_COMMUNITY): Payer: Self-pay | Admitting: Psychiatry

## 2014-09-20 ENCOUNTER — Ambulatory Visit (INDEPENDENT_AMBULATORY_CARE_PROVIDER_SITE_OTHER): Payer: Medicare HMO | Admitting: Psychiatry

## 2014-09-20 VITALS — BP 120/72 | Ht 62.0 in | Wt 209.0 lb

## 2014-09-20 DIAGNOSIS — F330 Major depressive disorder, recurrent, mild: Secondary | ICD-10-CM

## 2014-09-20 MED ORDER — VILAZODONE HCL 40 MG PO TABS: 40.0000 mg | ORAL_TABLET | ORAL | Status: DC

## 2014-09-20 MED ORDER — ZOLPIDEM TARTRATE 10 MG PO TABS: 10.0000 mg | ORAL_TABLET | Freq: Every day | ORAL | Status: DC

## 2014-09-20 MED ORDER — CLONAZEPAM 1 MG PO TABS: 1.0000 mg | ORAL_TABLET | Freq: Two times a day (BID) | ORAL | Status: DC

## 2014-09-20 MED ORDER — GABAPENTIN 300 MG PO CAPS: 300.0000 mg | ORAL_CAPSULE | Freq: Three times a day (TID) | ORAL | Status: DC

## 2014-09-20 NOTE — Progress Notes (Signed)
[name], female   DOB: Jan 29, 1946, 69 y.o.   MRN: [id number] [name], female   DOB: 1945-10-11, 69 y.o.   MRN: [id number] [name], female   DOB: [birth year], 69 y.o.   MRN: [id number] [name], female   DOB: 07-13-1945, 69 y.o.   MRN: [id number] [name], female   DOB: Nov 14, 1945, 69 y.o.   MRN: [id number] [name], female   DOB: April 17, 1946, 69 y.o.   MRN: [id number] [name], female   DOB: [birth year], 69 y.o.   MRN: [id number] [name], female   DOB: 1945-08-11, 69 y.o.   MRN: [id number] [name], female   DOB: 1945/12/18, 69 y.o.   MRN: [id number] [facility] [id number] Progress Note [name] MRN: [id number] DOB: January 08, 1946 Age: 69 y.o.  Date: 09/20/2014 Start Time: 9:45 AM End Time: 10:10 AM  Chief Complaint: Chief Complaint  Patient presents with  . Depression  . Anxiety  . Follow-up    Subjective: "I'm a little better "  This patient is a 69 year old black female who lives with her 5 year old daughter who has mild mental retardation. She lives in [place]. She is a retired [employer]. The patient states that she's been depressed "all my life.". She can't give any specific reasons for the depression but notes that at age 71 she was already taking medication to help with sleep. In her 43s her family doctor diagnosed with schizophrenia although she had no symptoms of paranoia or delusions or hallucinations. She was given medication for this.  Since 1997 she's been coming here with symptoms of depression. She's also had significant problems with fibromyalgia and fatigue. In Neurontin and Ambien have helped. Lately however she's become more depressed and worried. She has had gallbladder surgery and she's concerned about how is going to go. For some reason she's been thinking a lot about death. She doesn't have anyone to stay  with her daughter something goes wrong. She's been more negative but is densely not suicidal but her energy has dropped. In general she is a very functional person  The patient returns after 2 months. She recently had her breast surgery and had a fibroma removed at [facility]. We do not have the records of this but she stated it was benign and she does not need chemotherapy or radiation. Her mood is been fairly stable and she thinks the Viibryd is helped but she also thinks it gives her bad dreams. I've suggested she move it up to dinnertime or even morning. She still having a lot of leg pain and Dr. Aline Brochure in orthopedics is trying to evaluate this. She's not sure if the Neurontin helps that much but it would probably worse without it. She only uses the clonazepam once in a while. She denies suicidal ideation  Current psychiatric medication Neurontin 300 mg 3 times a day Ambien 10 mg at bedtime Viibryd 40 mg daily Clonazepam 1 mg when necessary twice a day Vitals: BP 120/72 mmHg  Ht 5\' 2"  (1.575 m)  Wt 209 lb (94.802 kg)  BMI 38.22 kg/m2  Psychiatric history Patient has a long history of depression.  She has been seeing in this office since May 1997.  Patient denies any history of suicidal attempt however endorse chronic depression and anxiety.  In the past she has taken Prozac but did not like after taking few  doses, she also has taken Seroquel, Abilify, Xanax, Pamelor, and amitriptyline.  Patient denies a history of paranoia or delusions however there are times when she has been very depressed and have a lot of negative symptoms.  Alcohol and substance use history Patient denies any history of alcohol or substance use.  Allergies: Allergies  Allergen Reactions  . Elavil [Amitriptyline] Other (See Comments)    Felt really nervous and felt like something was hold her feet or arm and/ or AM headache on it and back on it and went away off it.   . Abilify [Aripiprazole] Other (See  Comments)    Dystonic reaction  . Trazodone And Nefazodone Other (See Comments)    Brought back bad dreams of things in the past  . Codeine Hives, Nausea Only and Other (See Comments)    Feels funny  . Latex Hives  . Penicillin V Itching  . Penicillins Hives  . Polyethylene Glycol Other (See Comments)    Per allergy test  . Sulfonamide Derivatives Nausea And Vomiting  . Cymbalta [Duloxetine Hcl] Rash  . Remeron [Mirtazapine] Other (See Comments)    Caused lots of strange, weird, crazy dreams.   Medical History: Past Medical History  Diagnosis Date  . Hypertension   . Hyperlipidemia   . Gastroesophageal reflux disease   . Diverticulosis   . Anxiety and depression   . Adenomatous colon polyp 2006    excised in 2006 & 2010Due surveillance 06/2013  . Chronic back pain   . Thyroid nodule   . Fibromyalgia   . Migraines   . Insomnia   . Chronic constipation   . Asthma   . History of benign esophageal tumor 2010    granular cell esophageal tumor (Dx 06/2008), resected via EMR 2011, due repeat EGD 02/2012  . Chest discomfort   . Hiatal hernia   . Depression   . Anxiety   . Sleep apnea     Stop Bang score of 5. Pt said she was told by Dr. Merlene Laughter that she had "a little bit" of sleep apena, but not bad enough to treat.  . Breast tumor   Patient has history of hypertension, hyperlipidemia, GERD, diverticulosis, colonic polyp, chronic back pain, fibromyalgia, chronic constipation, benign tumor of esophagus and chronic headache.  Her primary care physician is Dr. Legrand Rams, she sees Dr. Paulita Cradle for chronic pain, fibromyalgia and headache. Her cardiologist is Dr Elvera Maria. Surgical History: Past Surgical History  Procedure Laterality Date  . Tonsillectomy    . Abdominal hysterectomy    . Total knee arthroplasty  2002    Right; previous arthroscopic surgery  . Shoulder arthroscopy      Right; bone spurs removed  . Lung biopsy      negative  . 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 Right oophorectomy      benign disease  . Colonoscopy  06/2008, 06/2011    sigmoid tics, tubular adenoma; 2013: anal canal hemorrhoids  . Eus  08/2010    [facility] with EGD. Retained food. No recurrent esophageal lesion, bx negative.  . Colonoscopy  07/10/2011    Anal canal hemorrhoids likely the cause of hematochezia in the setting of constipation; otherwise normal rectum ;submucosal  petechiae in left colon of doubtful clinical significance; otherwise, normal colon  . Esophagogastroduodenoscopy  12/11/2011    XBL:[id number] Schatzki's ring; otherwise normal/Small hiatal hernia. Antral and bulbar erosions  . [facility] ph study  12/11/2011  Procedure: BRAVO Nunez;  Surgeon: Daneil Dolin, MD;  Location: [facility] SUITE;  Service: Endoscopy;  Laterality: N/A;  . Cholecystectomy N/A 04/09/2013    Procedure: LAPAROSCOPIC CHOLECYSTECTOMY;  Surgeon: Jamesetta So, MD;  Location: [facility] ORS;  Service: General;  Laterality: N/A;   Family History: family history includes Alcohol abuse in her father; Anxiety disorder in her mother; Colon cancer in her paternal aunt and paternal uncle; Dementia in her maternal uncle; Depression in her mother; Heart attack in her mother; Stroke in her father. There is no history of ADD / ADHD, Bipolar disorder, Drug abuse, OCD, Paranoid behavior, Schizophrenia, Seizures, Sexual abuse, or Physical abuse. Reviewed and nothing new today again.  Mental status examination Patient is casually dressed and fairly groomed. She is calm cooperative and maintained fair eye contact. She described her mood as somewhat depressed but improved and her affect remains flat she did smile more today She still has chronic pain  Her speech is coherent but slow.  Her thought processes clear and logical. She denies suicidal ideation but no plans, no active or passive homicidal thoughts. She denies any auditory or visual hallucination. There no psychotic  symptoms present at this time. She's alert and oriented x3. Her insight judgment impulse control is okay  Lab Results:  Results for orders placed or performed in visit on 01/11/14 (from the past 8736 hour(s))  Hepatic function panel   Collection Time: 01/11/14  4:14 PM  Result Value Ref Range   Total Bilirubin 0.5 0.2 - 1.2 mg/dL   Bilirubin, Direct 0.1 0.0 - 0.3 mg/dL   Indirect Bilirubin 0.4 0.2 - 1.2 mg/dL   Alkaline Phosphatase 56 39 - 117 U/L   AST 23 0 - 37 U/L   ALT 17 0 - 35 U/L   Total Protein 6.3 6.0 - 8.3 g/dL   Albumin 3.9 3.5 - 5.2 g/dL   Her cardiologist draws her labs and reportedly there are no problems.    Assessment Axis I Maj. depressive disorder Axis II deferred Axis III see medical history Axis IV are to moderate.  Plan: I took her vitals.  I reviewed CC, tobacco/med/surg Hx, meds effects/ side effects, problem list, therapies and responses as well as current situation/symptoms discussed options. She will continue Viibryd  40 mg,, continue Neurontin 300 mg 3 times a day and clonazepam 1 mg twice a day . She will continue Ambien 10 mg daily at bedtime for now. She'll return in 3 months  See orders and pt instructions for more details.  MEDICATIONS this encounter: Meds ordered this encounter  Medications  . Vilazodone HCl (VIIBRYD) 40 MG TABS    Sig: Take 1 tablet (40 mg total) by mouth every morning.    Dispense:  30 tablet    Refill:  2  . gabapentin (NEURONTIN) 300 MG capsule    Sig: Take 1 capsule (300 mg total) by mouth 3 (three) times daily.    Dispense:  90 capsule    Refill:  2  . zolpidem (AMBIEN) 10 MG tablet    Sig: Take 1 tablet (10 mg total) by mouth at bedtime.    Dispense:  30 tablet    Refill:  2  . clonazePAM (KLONOPIN) 1 MG tablet    Sig: Take 1 tablet (1 mg total) by mouth 2 (two) times daily.    Dispense:  60 tablet    Refill:  2    Medical Decision Making Problem Points:  Established problem, stable/improving (1), New  problem,  with additional work-up planned (4), Review of last therapy session (1) and Review of psycho-social stressors (1) Data Points:  Review or order clinical lab tests (1) Review of new medications or change in dosage (2)  I certify that outpatient services furnished can reasonably be expected to improve the patient's condition.   Levonne Spiller, MD

## 2014-09-27 ENCOUNTER — Ambulatory Visit (INDEPENDENT_AMBULATORY_CARE_PROVIDER_SITE_OTHER): Payer: Medicare HMO | Admitting: Adult Health

## 2014-09-27 ENCOUNTER — Encounter: Payer: Self-pay | Admitting: Adult Health

## 2014-09-27 VITALS — BP 142/86 | HR 77 | Ht 62.0 in | Wt 214.0 lb

## 2014-09-27 DIAGNOSIS — E785 Hyperlipidemia, unspecified: Secondary | ICD-10-CM | POA: Diagnosis not present

## 2014-09-27 DIAGNOSIS — I10 Essential (primary) hypertension: Secondary | ICD-10-CM | POA: Diagnosis not present

## 2014-09-27 NOTE — Progress Notes (Deleted)
Name: [name]    DOB: February 28, 1946  Age: 69 y.o.  MR#: [id number]       PCP:  Rosita Fire, MD      Insurance: Payor: [employer] MEDICARE / Plan: [employer] MEDICARE HMO/PPO / Product Type: *No Product type* /   CC:    Chief Complaint  Patient presents with  . Dizziness  . Bradycardia    VS Filed Vitals:   09/27/14 1300  BP: 142/86  Pulse: 77  Height: 5\' 2"  (1.575 m)  Weight: 214 lb (97.07 kg)  SpO2: 95%    Weights Current Weight  09/27/14 214 lb (97.07 kg)  09/20/14 209 lb (94.802 kg)  08/30/14 212 lb (96.163 kg)    Blood Pressure  BP Readings from Last 3 Encounters:  09/27/14 142/86  09/20/14 120/72  08/30/14 132/74     Admit date:  (Not on file) Last encounter with RMR:  Visit date not found   Allergy Elavil; Abilify; Trazodone and nefazodone; Codeine; Latex; Penicillin v; Penicillins; Polyethylene glycol; Sulfonamide derivatives; Cymbalta; and Remeron  Current Outpatient Prescriptions  Medication Sig Dispense Refill  . albuterol (PROVENTIL HFA;VENTOLIN HFA) 108 (90 BASE) MCG/ACT inhaler Inhale 2 puffs into the lungs every 6 (six) hours as needed. For shortness of breath    . alendronate (FOSAMAX) 70 MG tablet 70 mg once a week.    Marland Kitchen amLODipine (NORVASC) 5 MG tablet Take 10 mg by mouth daily.     Marland Kitchen atenolol (TENORMIN) 50 MG tablet Take 1 tablet (50 mg total) by mouth 2 (two) times daily. 180 tablet 3  . clonazePAM (KLONOPIN) 1 MG tablet Take 1 tablet (1 mg total) by mouth 2 (two) times daily. 60 tablet 2  . dexlansoprazole (DEXILANT) 60 MG capsule Take 1 capsule (60 mg total) by mouth daily. (Patient taking differently: Take 60 mg by mouth 2 (two) times daily. ) 30 capsule 11  . diclofenac (CATAFLAM) 50 MG tablet Take 1 tablet (50 mg total) by mouth 2 (two) times daily. 60 tablet 1  . furosemide (LASIX) 40 MG tablet May take daily AS NEEDED for swelling 90 tablet 3  . gabapentin (NEURONTIN) 300 MG capsule Take 1 capsule (300 mg total) by mouth 3 (three) times daily. 90  capsule 2  . HYDROcodone-acetaminophen (NORCO) 10-325 MG per tablet Take 1 tablet by mouth every 4 (four) hours as needed. 180 tablet 0  . lisinopril-hydrochlorothiazide (PRINZIDE,ZESTORETIC) 20-25 MG per tablet Take 1 tablet by mouth daily.    Marland Kitchen lovastatin (MEVACOR) 40 MG tablet Take 1 tablet (40 mg total) by mouth at bedtime. 90 tablet 3  . lubiprostone (AMITIZA) 8 MCG capsule Take 1 capsule (8 mcg total) by mouth 2 (two) times daily with a meal. 60 capsule 11  . meclizine (ANTIVERT) 25 MG tablet Take 25 mg by mouth 3 (three) times daily as needed for dizziness.    . nystatin ointment (MYCOSTATIN) Apply 1 application topically 2 (two) times daily.    . Olopatadine HCl 0.2 % SOLN Apply to eye.    . potassium chloride SA (K-DUR,KLOR-CON) 20 MEQ tablet Take 20 mEq by mouth once.     . promethazine (PHENERGAN) 25 MG tablet Take 25 mg by mouth every 6 (six) hours as needed for nausea or vomiting.    . traMADol (ULTRAM) 50 MG tablet Take 50 mg by mouth every 6 (six) hours as needed. For pain    . Vilazodone HCl (VIIBRYD) 40 MG TABS Take 1 tablet (40 mg total) by mouth every morning.  30 tablet 2  . Vitamin D, Ergocalciferol, (DRISDOL) 50000 UNITS CAPS capsule Take 50,000 Units by mouth every 30 (thirty) days.     Marland Kitchen zolpidem (AMBIEN) 10 MG tablet Take 1 tablet (10 mg total) by mouth at bedtime. 30 tablet 2   No current facility-administered medications for this visit.    Discontinued Meds:    Medications Discontinued During This Encounter  Medication Reason  . BOTOX 200 UNITS SOLR Error    Patient Active Problem List   Diagnosis Date Noted  . Knee contusion 05/02/2014  . Acquired trigger finger 10/12/2013  . Constipation 07/15/2013  . Abdominal pain, epigastric 07/15/2013  . LLQ pain 07/15/2013  . Lightheaded 01/21/2013  . Elevated LFTs 12/26/2012  . History of benign esophageal tumor   . Adenomatous colon polyp   . Nellie DISEASE, LUMBOSACRAL SPINE 05/01/2010  .  HYPERLIPIDEMIA 08/22/2009  . THYROID NODULE 08/16/2009  . Anxiety and depression 08/16/2009  . HYPERTENSION 08/16/2009  . Gastroesophageal reflux disease 08/16/2009  . FIBROMYALGIA 08/16/2009    LABS    Component Value Date/Time   NA 143 04/05/2013 1315   NA 143 12/28/2012 0514   NA 142 12/27/2012 0603   K 4.4 04/05/2013 1315   K 3.8 12/28/2012 0514   K 3.7 12/27/2012 0603   CL 105 04/05/2013 1315   CL 113* 12/28/2012 0514   CL 112 12/27/2012 0603   CO2 28 04/05/2013 1315   CO2 22 12/28/2012 0514   CO2 24 12/27/2012 0603   GLUCOSE 93 04/05/2013 1315   GLUCOSE 106* 12/28/2012 0514   GLUCOSE 104* 12/27/2012 0603   BUN 14 04/05/2013 1315   BUN 11 12/28/2012 0514   BUN 12 12/27/2012 0603   CREATININE 0.95 04/05/2013 1315   CREATININE 0.79 12/28/2012 0514   CREATININE 0.81 12/27/2012 0603   CREATININE 1.07 01/21/2012 1159   CREATININE 0.86 06/14/2011 1145   CREATININE 0.71 05/15/2011 1050   CALCIUM 10.7* 04/05/2013 1315   CALCIUM 10.0 12/28/2012 0514   CALCIUM 9.8 12/27/2012 0603   GFRNONAA 61* 04/05/2013 1315   GFRNONAA 85* 12/28/2012 0514   GFRNONAA 74* 12/27/2012 0603   GFRAA 70* 04/05/2013 1315   GFRAA >90 12/28/2012 0514   GFRAA 86* 12/27/2012 0603   CMP     Component Value Date/Time   NA 143 04/05/2013 1315   K 4.4 04/05/2013 1315   CL 105 04/05/2013 1315   CO2 28 04/05/2013 1315   GLUCOSE 93 04/05/2013 1315   BUN 14 04/05/2013 1315   CREATININE 0.95 04/05/2013 1315   CREATININE 1.07 01/21/2012 1159   CALCIUM 10.7* 04/05/2013 1315   PROT 6.3 01/11/2014 1614   ALBUMIN 3.9 01/11/2014 1614   AST 23 01/11/2014 1614   ALT 17 01/11/2014 1614   ALKPHOS 56 01/11/2014 1614   BILITOT 0.5 01/11/2014 1614   GFRNONAA 61* 04/05/2013 1315   GFRAA 70* 04/05/2013 1315       Component Value Date/Time   WBC 4.8 04/05/2013 1315   WBC 5.1 12/27/2012 0603   WBC 4.9 12/26/2012 0649   HGB 13.2 04/05/2013 1315   HGB 12.1 12/27/2012 0603   HGB 12.6 12/26/2012 0649    HCT 40.9 04/05/2013 1315   HCT 37.3 12/27/2012 0603   HCT 38.8 12/26/2012 0649   MCV 83.1 04/05/2013 1315   MCV 81.3 12/27/2012 0603   MCV 81.9 12/26/2012 0649    Lipid Panel     Component Value Date/Time   CHOL 143 07/06/2011 0920   TRIG 68  07/06/2011 0920   HDL 54 07/06/2011 0920   CHOLHDL 2.6 07/06/2011 0920   VLDL 14 07/06/2011 0920   LDLCALC 75 07/06/2011 0920    ABG No results found for: PHART, PCO2ART, PO2ART, HCO3, TCO2, ACIDBASEDEF, O2SAT   Lab Results  Component Value Date   TSH 1.145 12/10/2010   BNP (last 3 results) No results for input(s): BNP in the last 8760 hours.  ProBNP (last 3 results) No results for input(s): PROBNP in the last 8760 hours.  Cardiac Panel (last 3 results) No results for input(s): CKTOTAL, CKMB, TROPONINI, RELINDX in the last 72 hours.  Iron/TIBC/Ferritin/ %Sat No results found for: IRON, TIBC, FERRITIN, IRONPCTSAT   EKG Orders placed or performed in visit on 02/07/14  . EKG 12-Lead     Prior Assessment and Plan Problem List as of 09/27/2014      Cardiovascular and Mediastinum   HYPERTENSION   Last Assessment & Plan 01/21/2013 Office Visit Written 01/21/2013 12:37 PM by Yehuda Savannah, MD    Blood pressure control was somewhat suboptimal in hospital and by patient's determinations at home. Her psychiatrist, Dr. Gilford Rile, recommends prazosin, as he feels it may provide benefit for her restless leg syndrome.  I was not aware of that effect of this drug, but have no objection to trying it as an additional antihypertensive agent.        Digestive   Gastroesophageal reflux disease   Last Assessment & Plan 07/15/2013 Office Visit Written 07/15/2013  2:33 PM by Mahala Menghini, PA-C    Continues to be ongoing issues for her. She has had multiple PPI failure as previously noted. H/O granular cell tumor of the esophagus in 2010. EUS by Dr. Newman Pies in 2012 showed no recurrent tumor. EGD in 2013 showed no recurrent tumor. She saw Dr. Newman Pies in  January 2013 for refractory heartburn, EGD and gastric emptying study offered the patient did not want to pursue any further workup at that time. Patient states she to stop the AcipHex and has no change in her symptoms. To discuss further with Dr. Gala Romney. Since she was unable to have a pH/impeded study due to difficult nasal intubation, wonder if she would be a candidate for pH study through [facility] a pH probe placement instead.      Adenomatous colon polyp   Constipation     Endocrine   THYROID NODULE     Musculoskeletal and Integument   DEGENERATIVE DISC DISEASE, LUMBOSACRAL SPINE   FIBROMYALGIA   Acquired trigger finger     Other   HYPERLIPIDEMIA   Last Assessment & Plan 01/21/2012 Office Visit Written 01/21/2012 12:16 PM by Yehuda Savannah, MD    Excellent control of hyperlipidemia, especially considering the absence of known vascular disease.  Current treatment with moderate dose lovastatin is quite adequate.      Anxiety and depression   History of benign esophageal tumor   Last Assessment & Plan 11/21/2011 Office Visit Written 11/21/2011  8:55 AM by Andria Meuse, NP    Scheduled for EGD as above      Elevated LFTs   Lightheaded   Last Assessment & Plan 01/21/2013 Office Visit Edited 01/21/2013 12:36 PM by Yehuda Savannah, MD    Patient complains of fairly frequent episodes of lightheadedness. She denies vertigo. Blood pressure medications have been adjusted in the past without benefit. I doubt that there is a cardiac cause for these symptoms, but we'll undertake event recording to verify that arrhythmia is not the cause.  Lightheadedness is of significant concern, since it was associated with her fall and right radial fracture.      Abdominal pain, epigastric   Last Assessment & Plan 07/15/2013 Office Visit Edited 07/15/2013  2:35 PM by Mahala Menghini, PA-C    Ongoing, intermittent epigastric pain. States no better since her gb was removed. ?related to GERD or gastropathy. We  will have her check LFTs 6-8 hours after next significant episode to make sure no biliary etiology. Ov with Dr. Gala Romney in six weeks.      LLQ pain   Last Assessment & Plan 07/15/2013 Office Visit Written 07/15/2013  2:30 PM by Mahala Menghini, PA-C    Likely secondary to constipation. She is up-to-date on her colonoscopy. Not due until 2018. Trial of Linzess 147mcg daily. Keep a stool diary and notations about abdominal pain. Ov in six weeks with Dr. Gala Romney.      Knee contusion       Imaging: No results found.

## 2014-09-27 NOTE — Patient Instructions (Addendum)
Your physician wants you to follow-up in: 1 year with Jory Sims, NP.  You will receive a reminder letter in the mail two months in advance. If you don't receive a letter, please call our office to schedule the follow-up appointment.  Your physician recommends that you continue on your current medications as directed. Please refer to the Current Medication list given to you today.  Your physician recommends that you return for lab work in: FASTING (CBC, Magnesium, BMET, Lipid)  Thank you for choosing [facility]!

## 2014-09-27 NOTE — Progress Notes (Signed)
Cardiology Office Note   Date:  09/27/2014   ID:  [name], DOB [birth year], MRN [id number]  PCP:  Rosita Fire, MD  Cardiologist: Cloria Spring, NP   Chief Complaint  Patient presents with  . Dizziness  . Bradycardia      History of Present Illness: [name] is a 69 y.o. female who presents for presents for ongoing assessment and management of lightheadedness and dizziness along with a history of bilateral lower extremity edema, other history includes hypertension, hyperlipidemia, GERD, anxiety, and depression.  The patient was last seen in our office on 08/30/2014, echocardiogram was obtained, she was given a prescription for when necessary, Lasix, her atenolol had been decreased on a prior appointment.  Echocardiogram completed on 09/06/2014, revealing normal LV systolic function with an EF of 60-65% with grade 1 diastolic dysfunction.  No changes are made on her medication regimen on this study.  She does a day, not remembering that she recently had the echocardiogram.The patient is under some stress.  His been having complaints of PVCs.  She has a family member undergoing surgery, this morning.  She denies any significant chest pain, but does have some GERD symptoms.she is medically compliant.  She is followed by a GI specialist   Past Medical History  Diagnosis Date  . Hypertension   . Hyperlipidemia   . Gastroesophageal reflux disease   . Diverticulosis   . Anxiety and depression   . Adenomatous colon polyp 2006    excised in 2006 & 2010Due surveillance 06/2013  . Chronic back pain   . Thyroid nodule   . Fibromyalgia   . Migraines   . Insomnia   . Chronic constipation   . Asthma   . History of benign esophageal tumor 2010    granular cell esophageal tumor (Dx 06/2008), resected via EMR 2011, due repeat EGD 02/2012  . Chest discomfort   . Hiatal hernia   . Depression   . Anxiety   . Sleep apnea     Stop Bang score of 5. Pt said she was told by  Dr. Merlene Laughter that she had "a little bit" of sleep apena, but not bad enough to treat.  . Breast tumor     Past Surgical History  Procedure Laterality Date  . Tonsillectomy    . Abdominal hysterectomy    . Total knee arthroplasty  2002    Right; previous arthroscopic surgery  . Shoulder arthroscopy      Right; bone spurs removed  . Lung biopsy      negative  . 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 Right oophorectomy      benign disease  . Colonoscopy  06/2008, 06/2011    sigmoid tics, tubular adenoma; 2013: anal canal hemorrhoids  . Eus  08/2010    Dr [facility] with EGD. Retained food. No recurrent esophageal lesion, bx negative.  . Colonoscopy  07/10/2011    Anal canal hemorrhoids likely the cause of hematochezia in the setting of constipation; otherwise normal rectum ;submucosal  petechiae in left colon of doubtful clinical significance; otherwise, normal colon  . Esophagogastroduodenoscopy  12/11/2011    EHM:[id number] Schatzki's ring; otherwise normal/Small hiatal hernia. Antral and bulbar erosions  . Bravo ph study  12/11/2011    Procedure: BRAVO [place] STUDY;  Surgeon: Daneil Dolin, MD;  Location: [facility] SUITE;  Service: Endoscopy;  Laterality: N/A;  . Cholecystectomy N/A 04/09/2013    Procedure: LAPAROSCOPIC CHOLECYSTECTOMY;  Surgeon: Jamesetta So, MD;  Location: [facility] ORS;  Service: General;  Laterality: N/A;     Current Outpatient Prescriptions  Medication Sig Dispense Refill  . albuterol (PROVENTIL HFA;VENTOLIN HFA) 108 (90 BASE) MCG/ACT inhaler Inhale 2 puffs into the lungs every 6 (six) hours as needed. For shortness of breath    . alendronate (FOSAMAX) 70 MG tablet 70 mg once a week.    Marland Kitchen amLODipine (NORVASC) 5 MG tablet Take 10 mg by mouth daily.     Marland Kitchen atenolol (TENORMIN) 50 MG tablet Take 1 tablet (50 mg total) by mouth 2 (two) times daily. 180 tablet 3  . BOTOX 200 UNITS SOLR Inject 200 Units into the skin every 3 (three)  months.    . clonazePAM (KLONOPIN) 1 MG tablet Take 1 tablet (1 mg total) by mouth 2 (two) times daily. 60 tablet 2  . dexlansoprazole (DEXILANT) 60 MG capsule Take 1 capsule (60 mg total) by mouth daily. (Patient taking differently: Take 60 mg by mouth 2 (two) times daily. ) 30 capsule 11  . diclofenac (CATAFLAM) 50 MG tablet Take 1 tablet (50 mg total) by mouth 2 (two) times daily. 60 tablet 1  . furosemide (LASIX) 40 MG tablet May take daily AS NEEDED for swelling 90 tablet 3  . gabapentin (NEURONTIN) 300 MG capsule Take 1 capsule (300 mg total) by mouth 3 (three) times daily. 90 capsule 2  . HYDROcodone-acetaminophen (NORCO) 10-325 MG per tablet Take 1 tablet by mouth every 4 (four) hours as needed. 180 tablet 0  . lisinopril-hydrochlorothiazide (PRINZIDE,ZESTORETIC) 20-25 MG per tablet Take 1 tablet by mouth daily.    Marland Kitchen lovastatin (MEVACOR) 40 MG tablet Take 1 tablet (40 mg total) by mouth at bedtime. 90 tablet 3  . lubiprostone (AMITIZA) 8 MCG capsule Take 1 capsule (8 mcg total) by mouth 2 (two) times daily with a meal. 60 capsule 11  . meclizine (ANTIVERT) 25 MG tablet Take 25 mg by mouth 3 (three) times daily as needed for dizziness.    . nystatin ointment (MYCOSTATIN) Apply 1 application topically 2 (two) times daily.    . Olopatadine HCl 0.2 % SOLN Apply to eye.    . potassium chloride SA (K-DUR,KLOR-CON) 20 MEQ tablet Take 20 mEq by mouth once.     . promethazine (PHENERGAN) 25 MG tablet Take 25 mg by mouth every 6 (six) hours as needed for nausea or vomiting.    . traMADol (ULTRAM) 50 MG tablet Take 50 mg by mouth every 6 (six) hours as needed. For pain    . Vilazodone HCl (VIIBRYD) 40 MG TABS Take 1 tablet (40 mg total) by mouth every morning. 30 tablet 2  . Vitamin D, Ergocalciferol, (DRISDOL) 50000 UNITS CAPS capsule Take 50,000 Units by mouth every 30 (thirty) days.     Marland Kitchen zolpidem (AMBIEN) 10 MG tablet Take 1 tablet (10 mg total) by mouth at bedtime. 30 tablet 2   No current  facility-administered medications for this visit.    Allergies:   Elavil; Abilify; Trazodone and nefazodone; Codeine; Latex; Penicillin v; Penicillins; Polyethylene glycol; Sulfonamide derivatives; Cymbalta; and Remeron    Social History:  The patient  reports that she has never smoked. She has never used smokeless tobacco. She reports that she does not drink alcohol or use illicit drugs.   Family History:  The patient's family history includes Alcohol abuse in her father; Anxiety disorder in her mother; Colon cancer in her paternal aunt and paternal uncle; Dementia in her maternal  uncle; Depression in her mother; Heart attack in her mother; Stroke in her father. There is no history of ADD / ADHD, Bipolar disorder, Drug abuse, OCD, Paranoid behavior, Schizophrenia, Seizures, Sexual abuse, or Physical abuse.    ROS: .   All other systems are reviewed and negative.Unless otherwise mentioned in H&P above.   PHYSICAL EXAM: VS:  There were no vitals taken for this visit. , BMI There is no weight on file to calculate BMI. GEN: Well nourished, well developed, in no acute distress HEENT: normal Neck: no JVD, carotid bruits, or masses Cardiac: RRR; occasional PVC,  no murmurs, rubs, or gallops,no edema  Respiratory:  Clear to auscultation bilaterally, normal work of breathing GI: soft, nontender, nondistended, + BS MS: no deformity or atrophy Skin: warm and dry, no rash Neuro:  Strength and sensation are intact Psych: euthymic mood, full affect   Recent Labs: 01/11/2014: ALT 17    Lipid Panel    Component Value Date/Time   CHOL 143 07/06/2011 0920   TRIG 68 07/06/2011 0920   HDL 54 07/06/2011 0920   CHOLHDL 2.6 07/06/2011 0920   VLDL 14 07/06/2011 0920   LDLCALC 75 07/06/2011 0920      Wt Readings from Last 3 Encounters:  09/20/14 209 lb (94.802 kg)  08/30/14 212 lb (96.163 kg)  08/29/14 193 lb (87.544 kg)      Other studies Reviewed: Additional studies/ records that were  reviewed today include: most recent office visit.  Echocardiogram completed last month Review of the above records demonstrates:normal test results   ASSESSMENT AND PLAN:  1. Atypical chest pain: Appears to be more GI in etiology.  GERD-like symptoms from the abdomen into the throat.  She had a normal tress test or echocardiogram for the last 6 months.1 make any changes in her medication regimen.  We will see her again in one year unless she is symptomatic.  She is advised to followup with GI.  2. Hypertension: blood pressure still elevated today.  She is anxious on this visit and the family members undergoing surgery today.  We will not make any changes in her medication regimen refills are authorized.   Current medicines are reviewed at length with the patient today.    Labs/ tests ordered today include: BMET, lipids, CBC, magnesium.  .No orders of the defined types were placed in this encounter.     Disposition:   FU with 1 year Signed, Jory Sims, NP  09/27/2014 7:17 AM    [facility] [address] [place], [place] [address] Phone: [phone]; Fax: [phone]

## 2014-09-28 LAB — CBC WITH DIFFERENTIAL/PLATELET
Basophils Absolute: 0.1 10*3/uL (ref 0.0–0.1)
Basophils Relative: 1 % (ref 0–1)
Eosinophils Absolute: 0.1 10*3/uL (ref 0.0–0.7)
Eosinophils Relative: 2 % (ref 0–5)
HCT: 40 % (ref 36.0–46.0)
Hemoglobin: 12.7 g/dL (ref 12.0–15.0)
Lymphocytes Relative: 34 % (ref 12–46)
Lymphs Abs: 2 10*3/uL (ref 0.7–4.0)
MCH: 25.8 pg — ABNORMAL LOW (ref 26.0–34.0)
MCHC: 31.8 g/dL (ref 30.0–36.0)
MCV: 81.3 fL (ref 78.0–100.0)
MPV: 9.5 fL (ref 8.6–12.4)
Monocytes Absolute: 0.5 10*3/uL (ref 0.1–1.0)
Monocytes Relative: 8 % (ref 3–12)
Neutro Abs: 3.2 10*3/uL (ref 1.7–7.7)
Neutrophils Relative %: 55 % (ref 43–77)
Platelets: 207 10*3/uL (ref 150–400)
RBC: 4.92 MIL/uL (ref 3.87–5.11)
RDW: 15.8 % — ABNORMAL HIGH (ref 11.5–15.5)
WBC: 5.8 10*3/uL (ref 4.0–10.5)

## 2014-09-28 LAB — BASIC METABOLIC PANEL
BUN: 15 mg/dL (ref 6–23)
CO2: 29 mEq/L (ref 19–32)
Calcium: 9.8 mg/dL (ref 8.4–10.5)
Chloride: 101 mEq/L (ref 96–112)
Creat: 1.05 mg/dL (ref 0.50–1.10)
Glucose, Bld: 94 mg/dL (ref 70–99)
Potassium: 3.8 mEq/L (ref 3.5–5.3)
Sodium: 141 mEq/L (ref 135–145)

## 2014-09-28 LAB — MAGNESIUM: Magnesium: 1.6 mg/dL (ref 1.5–2.5)

## 2014-09-28 LAB — LIPID PANEL
Cholesterol: 173 mg/dL (ref 0–200)
HDL: 61 mg/dL (ref >=46)
LDL Cholesterol: 94 mg/dL (ref 0–99)
Total CHOL/HDL Ratio: 2.8 Ratio
Triglycerides: 88 mg/dL (ref <150)
VLDL: 18 mg/dL (ref 0–40)

## 2014-09-29 ENCOUNTER — Telehealth: Payer: Self-pay

## 2014-09-29 DIAGNOSIS — I10 Essential (primary) hypertension: Secondary | ICD-10-CM

## 2014-09-29 NOTE — Telephone Encounter (Signed)
-----   Message from Lendon Colonel, NP sent at 09/29/2014 12:31 PM EDT ----- Magnesium low at 1.6. Start magnesium 400 mg po daily. Repeat labs (Mg and BMET in one month). Otherwise no changes in her medical regimen as other labs are good.

## 2014-09-29 NOTE — Telephone Encounter (Signed)
Spoke with patient,mailed lab slips and written instructions for magnesium use,pt verbalized understanding

## 2014-10-04 ENCOUNTER — Ambulatory Visit (INDEPENDENT_AMBULATORY_CARE_PROVIDER_SITE_OTHER): Payer: Medicare HMO | Admitting: Orthopedic Surgery

## 2014-10-04 ENCOUNTER — Encounter: Payer: Self-pay | Admitting: Orthopedic Surgery

## 2014-10-04 ENCOUNTER — Ambulatory Visit (INDEPENDENT_AMBULATORY_CARE_PROVIDER_SITE_OTHER): Payer: Medicare HMO

## 2014-10-04 VITALS — BP 144/81 | Ht 62.0 in | Wt 214.0 lb

## 2014-10-04 DIAGNOSIS — M4806 Spinal stenosis, lumbar region: Secondary | ICD-10-CM | POA: Diagnosis not present

## 2014-10-04 DIAGNOSIS — M48061 Spinal stenosis, lumbar region without neurogenic claudication: Secondary | ICD-10-CM

## 2014-10-04 DIAGNOSIS — G8929 Other chronic pain: Secondary | ICD-10-CM

## 2014-10-04 DIAGNOSIS — M7150 Other bursitis, not elsewhere classified, unspecified site: Secondary | ICD-10-CM

## 2014-10-04 DIAGNOSIS — M1712 Unilateral primary osteoarthritis, left knee: Secondary | ICD-10-CM

## 2014-10-04 DIAGNOSIS — R52 Pain, unspecified: Secondary | ICD-10-CM

## 2014-10-04 DIAGNOSIS — M7050 Other bursitis of knee, unspecified knee: Secondary | ICD-10-CM

## 2014-10-04 MED ORDER — HYDROCODONE-ACETAMINOPHEN 10-325 MG PO TABS: 1.0000 | ORAL_TABLET | ORAL | Status: DC | PRN

## 2014-10-04 NOTE — Progress Notes (Signed)
Chief Complaint  Patient presents with  . Follow-up    5 week recheck bilateral knees + xray L-spine    Encounter Diagnoses  Name Primary?  . Spinal stenosis of lumbar region Yes  . Chronic generalized pain   . Primary osteoarthritis of left knee   . Pes anserinus bursitis     The patient's bursitis has improved and is asymptomatic. She still has osteoarthritis in the left knee which is stable. She has chronic generalized pain control with Norco milligrams  She is brought in today to workup her spine to assess why she is having what she calls jelly legs  Her symptoms include giving way symptoms of both legs when she stands for long periods of time, associated with back pain. The symptoms have been going on for several years now. Muscle spasm has not been a significant factor  Neurologic symptoms such as numbness and tingling are denied she has no fever chills or significant weight loss  Past Medical History  Diagnosis Date  . Hypertension   . Hyperlipidemia   . Gastroesophageal reflux disease   . Diverticulosis   . Anxiety and depression   . Adenomatous colon polyp 2006    excised in 2006 & 2010Due surveillance 06/2013  . Chronic back pain   . Thyroid nodule   . Fibromyalgia   . Migraines   . Insomnia   . Chronic constipation   . Asthma   . History of benign esophageal tumor 2010    granular cell esophageal tumor (Dx 06/2008), resected via EMR 2011, due repeat EGD 02/2012  . Chest discomfort   . Hiatal hernia   . Depression   . Anxiety   . Sleep apnea     Stop Bang score of 5. Pt said she was told by Dr. Merlene Laughter that she had "a little bit" of sleep apena, but not bad enough to treat.  . Breast tumor    BP 144/81 mmHg  Ht 5\' 2"  (1.575 m)  Wt 214 lb (97.07 kg)  BMI 39.13 kg/m2 She is in good spirits she is well-groomed she is oriented 3 her mood is pleasant she is ambulatory without significant difficulty. She has tenderness in her lower back shows decreased range of  motion of the spine her pain is somewhat relieved with flexion or motor exam is normal she has normal skin in both lower extremities she does not have any evidence of rash or erythema. Distally she has intact sensation and normal pulses bilaterally  X-rays show facet joint arthritis disc spaces are maintained  Impression probable spinal stenosis  Recommend a repeat injection of 80 mg of Depo-Medrol as she got excellent relief from this and during her last visit on March 7  Follow-up as needed continue current pain management

## 2014-10-25 ENCOUNTER — Other Ambulatory Visit (HOSPITAL_COMMUNITY): Payer: Self-pay | Admitting: Internal Medicine

## 2014-10-25 DIAGNOSIS — Z09 Encounter for follow-up examination after completed treatment for conditions other than malignant neoplasm: Secondary | ICD-10-CM

## 2014-10-25 DIAGNOSIS — D219 Benign neoplasm of connective and other soft tissue, unspecified: Secondary | ICD-10-CM

## 2014-10-28 ENCOUNTER — Other Ambulatory Visit: Payer: Self-pay | Admitting: Cardiology

## 2014-10-28 MED ORDER — LISINOPRIL-HYDROCHLOROTHIAZIDE 20-25 MG PO TABS: 1.0000 | ORAL_TABLET | Freq: Every day | ORAL | Status: DC

## 2014-10-28 NOTE — Telephone Encounter (Signed)
Received fax refill request  Rx # [id number] Medication:  Lisinopril / HCTZ 20-25 mg tab Qty 30 Sig:  Take one tablet each day Physician:  Harl Bowie

## 2014-10-29 LAB — BASIC METABOLIC PANEL
BUN: 17 mg/dL (ref 6–23)
CO2: 28 mEq/L (ref 19–32)
Calcium: 10.2 mg/dL (ref 8.4–10.5)
Chloride: 104 mEq/L (ref 96–112)
Creat: 1.1 mg/dL (ref 0.50–1.10)
Glucose, Bld: 95 mg/dL (ref 70–99)
Potassium: 3.6 mEq/L (ref 3.5–5.3)
Sodium: 141 mEq/L (ref 135–145)

## 2014-10-29 LAB — MAGNESIUM: Magnesium: 2.1 mg/dL (ref 1.5–2.5)

## 2014-11-03 ENCOUNTER — Telehealth (HOSPITAL_COMMUNITY): Payer: Self-pay | Admitting: *Deleted

## 2014-11-03 NOTE — Telephone Encounter (Signed)
Received prior authorization for Belsomra. Patient is no longer taking this medication. Notified pharmacy.

## 2014-11-22 ENCOUNTER — Encounter (HOSPITAL_COMMUNITY): Payer: Self-pay

## 2014-12-07 ENCOUNTER — Other Ambulatory Visit: Payer: Self-pay | Admitting: Cardiology

## 2014-12-08 ENCOUNTER — Other Ambulatory Visit: Payer: Self-pay | Admitting: *Deleted

## 2014-12-08 ENCOUNTER — Other Ambulatory Visit: Payer: Self-pay | Admitting: Orthopedic Surgery

## 2014-12-08 ENCOUNTER — Encounter: Payer: Self-pay | Admitting: *Deleted

## 2014-12-08 ENCOUNTER — Telehealth: Payer: Self-pay | Admitting: Orthopedic Surgery

## 2014-12-08 DIAGNOSIS — R52 Pain, unspecified: Secondary | ICD-10-CM

## 2014-12-08 DIAGNOSIS — G8929 Other chronic pain: Secondary | ICD-10-CM

## 2014-12-08 DIAGNOSIS — M48061 Spinal stenosis, lumbar region without neurogenic claudication: Secondary | ICD-10-CM

## 2014-12-08 MED ORDER — HYDROCODONE-ACETAMINOPHEN 10-325 MG PO TABS: 1.0000 | ORAL_TABLET | ORAL | Status: DC | PRN

## 2014-12-08 NOTE — Telephone Encounter (Signed)
norco printed, ok to refill diclofenac?

## 2014-12-08 NOTE — Telephone Encounter (Signed)
Patient is requesting a refill on her medication HYDROcodone-acetaminophen (NORCO) 10-325 MG per tablet and diclofenac (CATAFLAM) 50 MG tablet , please advise?

## 2014-12-13 NOTE — Telephone Encounter (Signed)
Patient asking about the medication diclofenac?

## 2014-12-15 ENCOUNTER — Other Ambulatory Visit: Payer: Self-pay | Admitting: *Deleted

## 2014-12-15 MED ORDER — DICLOFENAC POTASSIUM 50 MG PO TABS: 50.0000 mg | ORAL_TABLET | Freq: Two times a day (BID) | ORAL | Status: DC

## 2014-12-20 ENCOUNTER — Ambulatory Visit (HOSPITAL_COMMUNITY)
Admission: RE | Admit: 2014-12-20 | Discharge: 2014-12-20 | Disposition: A | Discharge status: Home / Self Care | Payer: Medicare HMO | Source: Ambulatory Visit | Attending: Internal Medicine | Admitting: Internal Medicine

## 2014-12-20 ENCOUNTER — Other Ambulatory Visit (HOSPITAL_COMMUNITY): Payer: Self-pay | Admitting: Internal Medicine

## 2014-12-20 DIAGNOSIS — R928 Other abnormal and inconclusive findings on diagnostic imaging of breast: Secondary | ICD-10-CM | POA: Diagnosis present

## 2014-12-20 DIAGNOSIS — D219 Benign neoplasm of connective and other soft tissue, unspecified: Secondary | ICD-10-CM

## 2014-12-20 DIAGNOSIS — R922 Inconclusive mammogram: Secondary | ICD-10-CM

## 2014-12-20 DIAGNOSIS — N63 Unspecified lump in breast: Secondary | ICD-10-CM | POA: Diagnosis not present

## 2014-12-20 DIAGNOSIS — Z09 Encounter for follow-up examination after completed treatment for conditions other than malignant neoplasm: Secondary | ICD-10-CM

## 2014-12-20 DIAGNOSIS — Z86018 Personal history of other benign neoplasm: Secondary | ICD-10-CM | POA: Insufficient documentation

## 2014-12-21 ENCOUNTER — Ambulatory Visit (INDEPENDENT_AMBULATORY_CARE_PROVIDER_SITE_OTHER): Payer: Medicare HMO | Admitting: Psychiatry

## 2014-12-21 ENCOUNTER — Encounter (HOSPITAL_COMMUNITY): Payer: Self-pay | Admitting: Psychiatry

## 2014-12-21 VITALS — BP 139/94 | HR 56 | Ht 62.0 in | Wt 214.6 lb

## 2014-12-21 DIAGNOSIS — F329 Major depressive disorder, single episode, unspecified: Secondary | ICD-10-CM | POA: Diagnosis not present

## 2014-12-21 DIAGNOSIS — F330 Major depressive disorder, recurrent, mild: Secondary | ICD-10-CM

## 2014-12-21 MED ORDER — ZOLPIDEM TARTRATE 10 MG PO TABS: 10.0000 mg | ORAL_TABLET | Freq: Every day | ORAL | Status: DC

## 2014-12-21 MED ORDER — CLONAZEPAM 1 MG PO TABS: 1.0000 mg | ORAL_TABLET | Freq: Two times a day (BID) | ORAL | Status: DC

## 2014-12-21 MED ORDER — VILAZODONE HCL 40 MG PO TABS: 40.0000 mg | ORAL_TABLET | ORAL | Status: DC

## 2014-12-21 MED ORDER — GABAPENTIN 300 MG PO CAPS: 300.0000 mg | ORAL_CAPSULE | Freq: Three times a day (TID) | ORAL | Status: DC

## 2014-12-21 NOTE — Progress Notes (Signed)
Patient ID: CASAUNDRA TAKACS, female   DOB: 02/02/1946, 69 y.o.   MRN: [id number] Patient ID: MURL ZOGG, female   DOB: 06-27-1945, 69 y.o.   MRN: [id number] Patient ID: BIJOU EASLER, female   DOB: Sep 13, 1945, 69 y.o.   MRN: [id number] Patient ID: TAITUM MENTON, female   DOB: 1946/06/20, 69 y.o.   MRN: [id number] Patient ID: BRITTNE KAWASAKI, female   DOB: [birth year], 69 y.o.   MRN: [id number] Patient ID: OLIA HINDERLITER, female   DOB: Nov 25, 1945, 69 y.o.   MRN: [id number] Patient ID: SRAH AKE, female   DOB: April 01, 1946, 69 y.o.   MRN: [id number] Patient ID: DAIELLE MELCHER, female   DOB: [birth year], 69 y.o.   MRN: [id number] Patient ID: MARISELDA BADALAMENTI, female   DOB: 04/28/1946, 69 y.o.   MRN: [id number] Patient ID: LANNETTE AVELLINO, female   DOB: Apr 30, 1946, 69 y.o.   MRN: [id number] [facility] [id number] Progress Note [name] MRN: [id number] DOB: Oct 29, 1945 Age: 69 y.o.  Date: 12/21/2014 Start Time: 9:45 AM End Time: 10:10 AM  Chief Complaint: Chief Complaint  Patient presents with  . Depression  . Anxiety  . Follow-up    Subjective: "I'm a little better "  This patient is a 69 year old black female who lives with her 70 year old daughter who has mild mental retardation. She lives in [place]. She is a retired [employer]. The patient states that she's been depressed "all my life.". She can't give any specific reasons for the depression but notes that at age 29 she was already taking medication to help with sleep. In her 75s her family doctor diagnosed with schizophrenia although she had no symptoms of paranoia or delusions or hallucinations. She was given medication for this.  Since 1997 she's been coming here with symptoms of depression. She's also had significant problems with fibromyalgia and fatigue. In Neurontin and Ambien have helped. Lately however she's become more depressed and worried. She has had gallbladder surgery and she's concerned about how is going to go. For  some reason she's been thinking a lot about death. She doesn't have anyone to stay with her daughter something goes wrong. She's been more negative but is  not suicidal but her energy has dropped. In general she is a very functional person  The patient returns after 3 months. She is doing okay. She states that her mood is been fairly stable. Her knees still bother her and she has difficulty walking at times but this is probably due to some spinal stenosis in her back. She seems to be a slightly better mood than last visit. She was keeping a neighbor's child and is no longer doing this and this seems to be a relief. She sleeping well as she uses both the Ambien and 2 of the Neurontin at bedtime  Current psychiatric medication Neurontin 300 mg 3 times a day Ambien 10 mg at bedtime Viibryd 40 mg daily Clonazepam 1 mg when necessary twice a day Vitals: BP 139/94 mmHg  Pulse 56  Ht '5\' 2"'  (1.575 m)  Wt 214 lb 9.6 oz (97.342 kg)  BMI 39.24 kg/m2  Psychiatric history Patient has a long history of depression.  She has been seeing in this office since May 1997.  Patient denies any history of suicidal attempt however endorse chronic depression and anxiety.  In the past she has taken Prozac but did not like after taking few doses, she also has taken Seroquel, Abilify, Xanax, Pamelor, and amitriptyline.  Patient denies a history of paranoia or delusions however there are times when she has been very depressed and have a lot of negative symptoms.  Alcohol and substance use history Patient denies any history of alcohol or substance use.  Allergies: Allergies  Allergen Reactions  . Elavil [Amitriptyline] Other (See Comments)    Felt really nervous and felt like something was hold her feet or arm and/ or AM headache on it and back on it and went away off it.   . Abilify [Aripiprazole] Other (See Comments)    Dystonic reaction  . Trazodone And Nefazodone Other (See Comments)    Brought back bad dreams of  things in the past  . Codeine Hives, Nausea Only and Other (See Comments)    Feels funny  . Latex Hives  . Penicillin V Itching  . Penicillins Hives  . Polyethylene Glycol Other (See Comments)    Per allergy test  . Sulfonamide Derivatives Nausea And Vomiting  . Cymbalta [Duloxetine Hcl] Rash  . Remeron [Mirtazapine] Other (See Comments)    Caused lots of strange, weird, crazy dreams.   Medical History: Past Medical History  Diagnosis Date  . Hypertension   . Hyperlipidemia   . Gastroesophageal reflux disease   . Diverticulosis   . Anxiety and depression   . Adenomatous colon polyp 2006    excised in 2006 & 2010Due surveillance 06/2013  . Chronic back pain   . Thyroid nodule   . Fibromyalgia   . Migraines   . Insomnia   . Chronic constipation   . Asthma   . History of benign esophageal tumor 2010    granular cell esophageal tumor (Dx 06/2008), resected via EMR 2011, due repeat EGD 02/2012  . Chest discomfort   . Hiatal hernia   . Depression   . Anxiety   . Sleep apnea     Stop Bang score of 5. Pt said she was told by Dr. Merlene Laughter that she had "a little bit" of sleep apena, but not bad enough to treat.  . Breast tumor   Patient has history of hypertension, hyperlipidemia, GERD, diverticulosis, colonic polyp, chronic back pain, fibromyalgia, chronic constipation, benign tumor of esophagus and chronic headache.  Her primary care physician is Dr. Legrand Rams, she sees Dr. Paulita Cradle for chronic pain, fibromyalgia and headache. Her cardiologist is Dr Elvera Maria. Surgical History: Past Surgical History  Procedure Laterality Date  . Tonsillectomy    . Abdominal hysterectomy    . Total knee arthroplasty  2002    Right; previous arthroscopic surgery  . Shoulder arthroscopy      Right; bone spurs removed  . Lung biopsy      negative  . 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 Right oophorectomy      benign disease  . Colonoscopy  06/2008,  06/2011    sigmoid tics, tubular adenoma; 2013: anal canal hemorrhoids  . Eus  08/2010    Dr [facility] with EGD. Retained food. No recurrent esophageal lesion, bx negative.  . Colonoscopy  07/10/2011    Anal canal hemorrhoids likely the cause of hematochezia in the setting of constipation; otherwise normal rectum ;submucosal  petechiae in left colon of doubtful clinical significance; otherwise, normal colon  . Esophagogastroduodenoscopy  12/11/2011    WUJ:[id number] Schatzki's ring; otherwise normal/Small hiatal hernia. Antral and bulbar erosions  . Bravo ph study  12/11/2011    Procedure: BRAVO [place] STUDY;  Surgeon: Daneil Dolin, MD;  Location: [facility];  Service: Endoscopy;  Laterality: N/A;  . Cholecystectomy N/A 04/09/2013    Procedure: LAPAROSCOPIC CHOLECYSTECTOMY;  Surgeon: Jamesetta So, MD;  Location: [facility] ORS;  Service: General;  Laterality: N/A;   Family History: family history includes Alcohol abuse in her father; Anxiety disorder in her mother; Colon cancer in her paternal aunt and paternal uncle; Dementia in her maternal uncle; Depression in her mother; Heart attack in her mother; Stroke in her father. There is no history of ADD / ADHD, Bipolar disorder, Drug abuse, OCD, Paranoid behavior, Schizophrenia, Seizures, Sexual abuse, or Physical abuse. Reviewed and nothing new today again.  Mental status examination Patient is casually dressed and fairly groomed. She is calm cooperative and maintained fair eye contact. She described her mood as somewhat depressed but improved and her affect is brighter She still has chronic pain  Her speech is coherent but slow.  Her thought processes clear and logical. She denies suicidal ideation and no active or passive homicidal thoughts. She denies any auditory or visual hallucination. There no psychotic symptoms present at this time. She's alert and oriented x3. Her insight judgment impulse control is okay her fund of knowledge is fair and her  language skills are good  Lab Results:  Results for orders placed or performed in visit on 09/29/14 (from the past 8736 hour(s))  Basic Metabolic Panel (BMET)   Collection Time: 10/29/14 10:32 AM  Result Value Ref Range   Sodium 141 135 - 145 mEq/L   Potassium 3.6 3.5 - 5.3 mEq/L   Chloride 104 96 - 112 mEq/L   CO2 28 19 - 32 mEq/L   Glucose, Bld 95 70 - 99 mg/dL   BUN 17 6 - 23 mg/dL   Creat 1.10 0.50 - 1.10 mg/dL   Calcium 10.2 8.4 - 10.5 mg/dL  Magnesium   Collection Time: 10/29/14 10:32 AM  Result Value Ref Range   Magnesium 2.1 1.5 - 2.5 mg/dL  Results for orders placed or performed in visit on 09/27/14 (from the past 8736 hour(s))  Lipid Profile   Collection Time: 09/27/14 11:29 AM  Result Value Ref Range   Cholesterol 173 0 - 200 mg/dL   Triglycerides 88 <150 mg/dL   HDL 61 >=46 mg/dL   Total CHOL/HDL Ratio 2.8 Ratio   VLDL 18 0 - 40 mg/dL   LDL Cholesterol 94 0 - 99 mg/dL  Basic Metabolic Panel (BMET)   Collection Time: 09/27/14 11:29 AM  Result Value Ref Range   Sodium 141 135 - 145 mEq/L   Potassium 3.8 3.5 - 5.3 mEq/L   Chloride 101 96 - 112 mEq/L   CO2 29 19 - 32 mEq/L   Glucose, Bld 94 70 - 99 mg/dL   BUN 15 6 - 23 mg/dL   Creat 1.05 0.50 - 1.10 mg/dL   Calcium 9.8 8.4 - 10.5 mg/dL  CBC with Differential   Collection Time: 09/27/14 11:29 AM  Result Value Ref Range   WBC 5.8 4.0 - 10.5 K/uL   RBC 4.92 3.87 - 5.11 MIL/uL   Hemoglobin 12.7 12.0 - 15.0 g/dL   HCT 40.0 36.0 - 46.0 %   MCV 81.3 78.0 - 100.0 fL   MCH 25.8 (L) 26.0 - 34.0 pg   MCHC 31.8 30.0 - 36.0 g/dL   RDW 15.8 (H) 11.5 - 15.5 %   Platelets 207 150 - 400 K/uL   MPV 9.5 8.6 - 12.4 fL   Neutrophils Relative % 55 43 - 77 %  Neutro Abs 3.2 1.7 - 7.7 K/uL   Lymphocytes Relative 34 12 - 46 %   Lymphs Abs 2.0 0.7 - 4.0 K/uL   Monocytes Relative 8 3 - 12 %   Monocytes Absolute 0.5 0.1 - 1.0 K/uL   Eosinophils Relative 2 0 - 5 %   Eosinophils Absolute 0.1 0.0 - 0.7 K/uL   Basophils  Relative 1 0 - 1 %   Basophils Absolute 0.1 0.0 - 0.1 K/uL   Smear Review Criteria for review not met   Magnesium   Collection Time: 09/27/14 11:29 AM  Result Value Ref Range   Magnesium 1.6 1.5 - 2.5 mg/dL  Results for orders placed or performed in visit on 01/11/14 (from the past 8736 hour(s))  Hepatic function panel   Collection Time: 01/11/14  4:14 PM  Result Value Ref Range   Total Bilirubin 0.5 0.2 - 1.2 mg/dL   Bilirubin, Direct 0.1 0.0 - 0.3 mg/dL   Indirect Bilirubin 0.4 0.2 - 1.2 mg/dL   Alkaline Phosphatase 56 39 - 117 U/L   AST 23 0 - 37 U/L   ALT 17 0 - 35 U/L   Total Protein 6.3 6.0 - 8.3 g/dL   Albumin 3.9 3.5 - 5.2 g/dL   Review of systems is positive for bilateral knee pain  Assessment Axis I Maj. depressive disorder Axis II deferred Axis III see medical history Axis IV are to moderate.  Plan: I took her vitals.  I reviewed CC, tobacco/med/surg Hx, meds effects/ side effects, problem list, therapies and responses as well as current situation/symptoms discussed options. She will continue Viibryd  40 mg, for depression, continue Neurontin 300 mg 3 times a day and clonazepam 1 mg twice a day for anxiety . She will continue Ambien 10 mg daily at bedtime for sleep She'll return in 3 months  See orders and pt instructions for more details.  MEDICATIONS this encounter: Meds ordered this encounter  Medications  . gabapentin (NEURONTIN) 300 MG capsule    Sig: Take 1 capsule (300 mg total) by mouth 3 (three) times daily.    Dispense:  90 capsule    Refill:  2  . Vilazodone HCl (VIIBRYD) 40 MG TABS    Sig: Take 1 tablet (40 mg total) by mouth every morning.    Dispense:  30 tablet    Refill:  2  . clonazePAM (KLONOPIN) 1 MG tablet    Sig: Take 1 tablet (1 mg total) by mouth 2 (two) times daily.    Dispense:  60 tablet    Refill:  2  . zolpidem (AMBIEN) 10 MG tablet    Sig: Take 1 tablet (10 mg total) by mouth at bedtime.    Dispense:  30 tablet    Refill:  2     Medical Decision Making Problem Points:  Established problem, stable/improving (1), New problem, with additional work-up planned (4), Review of last therapy session (1) and Review of psycho-social stressors (1) Data Points:  Review or order clinical lab tests (1) Review of new medications or change in dosage (2)  I certify that outpatient services furnished can reasonably be expected to improve the patient's condition.   Levonne Spiller, MD

## 2015-01-23 ENCOUNTER — Other Ambulatory Visit: Payer: Self-pay | Admitting: Cardiology

## 2015-03-01 ENCOUNTER — Ambulatory Visit (INDEPENDENT_AMBULATORY_CARE_PROVIDER_SITE_OTHER): Payer: Medicare HMO | Admitting: Psychiatry

## 2015-03-01 ENCOUNTER — Encounter (HOSPITAL_COMMUNITY): Payer: Self-pay | Admitting: Psychiatry

## 2015-03-01 VITALS — BP 133/75 | HR 63 | Ht 62.0 in | Wt 217.4 lb

## 2015-03-01 DIAGNOSIS — F330 Major depressive disorder, recurrent, mild: Secondary | ICD-10-CM

## 2015-03-01 DIAGNOSIS — F329 Major depressive disorder, single episode, unspecified: Secondary | ICD-10-CM

## 2015-03-01 MED ORDER — GABAPENTIN 300 MG PO CAPS: 300.0000 mg | ORAL_CAPSULE | Freq: Three times a day (TID) | ORAL | Status: DC

## 2015-03-01 MED ORDER — CLONAZEPAM 1 MG PO TABS: 1.0000 mg | ORAL_TABLET | Freq: Two times a day (BID) | ORAL | Status: DC

## 2015-03-01 MED ORDER — VILAZODONE HCL 40 MG PO TABS: 40.0000 mg | ORAL_TABLET | ORAL | Status: DC

## 2015-03-01 MED ORDER — ZOLPIDEM TARTRATE 10 MG PO TABS: 10.0000 mg | ORAL_TABLET | Freq: Every day | ORAL | Status: DC

## 2015-03-01 MED ORDER — VILAZODONE HCL 20 MG PO TABS: 20.0000 mg | ORAL_TABLET | Freq: Every day | ORAL | Status: DC

## 2015-03-01 NOTE — Progress Notes (Signed)
[name], female   DOB: 06-13-1946, 69 y.o.   MRN: [id number] [name], female   DOB: 03/22/1946, 69 y.o.   MRN: [id number] [name], female   DOB: 10-27-1945, 69 y.o.   MRN: [id number] [name], female   DOB: 1945-06-29, 69 y.o.   MRN: [id number] [name], female   DOB: 1945/07/29, 69 y.o.   MRN: [id number] [name], female   DOB: 12-21-1945, 69 y.o.   MRN: [id number] [name], female   DOB: December 03, 1945, 69 y.o.   MRN: [id number] [name], female   DOB: [birth year], 69 y.o.   MRN: [id number] [name], female   DOB: 05/04/1946, 69 y.o.   MRN: [id number] [name], female   DOB: 1946/05/27, 69 y.o.   MRN: [id number] [name], female   DOB: 1945-07-18, 69 y.o.   MRN: [id number] [facility] [id number] Progress Note [name] MRN: [id number] DOB: [birth year] Age: 69 y.o.  Date: 03/01/2015 Start Time: 9:45 AM End Time: 10:10 AM  Chief Complaint: Chief Complaint  Patient presents with  . Depression  . Anxiety  . Follow-up    Subjective: "I'm in more pain "  This patient is a 69 year old black female who lives with her 29 year old daughter who has mild mental retardation. She lives in [place]. She is a retired [employer] manager. The patient states that she's been depressed "all my life.". She can't give any specific reasons for the depression but notes that at age 60 she was already taking medication to help with sleep. In her 17s her family doctor diagnosed with schizophrenia although she had no symptoms of paranoia or delusions or hallucinations. She was given medication for this.  Since 1997 she's been coming here with symptoms of depression. She's also had significant problems with fibromyalgia and fatigue. In Neurontin and Ambien have helped. Lately however she's become more depressed and worried. She has  had gallbladder surgery and she's concerned about how is going to go. For some reason she's been thinking a lot about death. She doesn't have anyone to stay with her daughter something goes wrong. She's been more negative but is  not suicidal but her energy has dropped. In general she is a very functional person  The patient returns after 3 months. She states that she is in more pain. She is no longer getting hydrocodone for many of her physicians. Her primary doctor, Dr. Legrand Rams states that she needs to go to pain management. Her neurologist will no longer prescribe any narcotics. I suggested she try Dr. Francesco Runner in [place]. In the meantime it's been hard for her to do much because her legs hurt so much. She's been more depressed because of her inactivity. I suggested we go up a little bit on the [place] but she really needs to get pain management involved whether it's to get prescriptions or other treatment modalities  Current psychiatric medication Neurontin 300 mg 3 times a day Ambien 10 mg at bedtime Viibryd 40 mg daily Clonazepam 1 mg when necessary twice a day Vitals: BP 133/75 mmHg  Pulse 63  Ht '5\' 2"'  (1.575 m)  Wt 217 lb 6.4 oz (98.612 kg)  BMI 39.75 kg/m2  Psychiatric history Patient has a long history of depression.  She has been seeing in this office since May 1997.  Patient denies any history  of suicidal attempt however endorse chronic depression and anxiety.  In the past she has taken Prozac but did not like after taking few doses, she also has taken Seroquel, Abilify, Xanax, Pamelor, and amitriptyline.  Patient denies a history of paranoia or delusions however there are times when she has been very depressed and have a lot of negative symptoms.  Alcohol and substance use history Patient denies any history of alcohol or substance use.  Allergies: Allergies  Allergen Reactions  . Elavil [Amitriptyline] Other (See Comments)    Felt really nervous and felt like something was hold her  feet or arm and/ or AM headache on it and back on it and went away off it.   . Abilify [Aripiprazole] Other (See Comments)    Dystonic reaction  . Trazodone And Nefazodone Other (See Comments)    Brought back bad dreams of things in the past  . Codeine Hives, Nausea Only and Other (See Comments)    Feels funny  . Latex Hives  . Penicillin V Itching  . Penicillins Hives  . Polyethylene Glycol Other (See Comments)    Per allergy test  . Sulfonamide Derivatives Nausea And Vomiting  . Cymbalta [Duloxetine Hcl] Rash  . Remeron [Mirtazapine] Other (See Comments)    Caused lots of strange, weird, crazy dreams.   Medical History: Past Medical History  Diagnosis Date  . Hypertension   . Hyperlipidemia   . Gastroesophageal reflux disease   . Diverticulosis   . Anxiety and depression   . Adenomatous colon polyp 2006    excised in 2006 & 2010Due surveillance 06/2013  . Chronic back pain   . Thyroid nodule   . Fibromyalgia   . Migraines   . Insomnia   . Chronic constipation   . Asthma   . History of benign esophageal tumor 2010    granular cell esophageal tumor (Dx 06/2008), resected via EMR 2011, due repeat EGD 02/2012  . Chest discomfort   . Hiatal hernia   . Depression   . Anxiety   . Sleep apnea     Stop Bang score of 5. Pt said she was told by Dr. Merlene Laughter that she had "a little bit" of sleep apena, but not bad enough to treat.  . Breast tumor   Patient has history of hypertension, hyperlipidemia, GERD, diverticulosis, colonic polyp, chronic back pain, fibromyalgia, chronic constipation, benign tumor of esophagus and chronic headache.  Her primary care physician is Dr. Legrand Rams, she sees Dr. Paulita Cradle for chronic pain, fibromyalgia and headache. Her cardiologist is Dr Elvera Maria. Surgical History: Past Surgical History  Procedure Laterality Date  . Tonsillectomy    . Abdominal hysterectomy    . Total knee arthroplasty  2002    Right; previous arthroscopic surgery  . Shoulder  arthroscopy      Right; bone spurs removed  . Lung biopsy      negative  . 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 Right oophorectomy      benign disease  . Colonoscopy  06/2008, 06/2011    sigmoid tics, tubular adenoma; 2013: anal canal hemorrhoids  . Eus  08/2010    Dr [facility] with EGD. Retained food. No recurrent esophageal lesion, bx negative.  . Colonoscopy  07/10/2011    Anal canal hemorrhoids likely the cause of hematochezia in the setting of constipation; otherwise normal rectum ;submucosal  petechiae in left colon of doubtful clinical significance; otherwise, normal colon  . Esophagogastroduodenoscopy  12/11/2011    [id number] Schatzki's ring; otherwise normal/Small hiatal hernia. Antral and bulbar erosions  . Bravo ph study  12/11/2011    Procedure: BRAVO [place] STUDY;  Surgeon: Daneil Dolin, MD;  Location: [facility];  Service: Endoscopy;  Laterality: N/A;  . Cholecystectomy N/A 04/09/2013    Procedure: LAPAROSCOPIC CHOLECYSTECTOMY;  Surgeon: Jamesetta So, MD;  Location: [facility] ORS;  Service: General;  Laterality: N/A;   Family History: family history includes Alcohol abuse in her father; Anxiety disorder in her mother; Colon cancer in her paternal aunt and paternal uncle; Dementia in her maternal uncle; Depression in her mother; Heart attack in her mother; Stroke in her father. There is no history of ADD / ADHD, Bipolar disorder, Drug abuse, OCD, Paranoid behavior, Schizophrenia, Seizures, Sexual abuse, or Physical abuse. Reviewed and nothing new today again.  Mental status examination Patient is casually dressed and fairly groomed. She is calm cooperative and maintained fair eye contact. She described her mood as somewhat depressed and she is frustrated with her pain level and inability to do much She still has chronic pain  Her speech is coherent but slow.  Her thought processes clear and logical. She denies suicidal ideation and  no active or passive homicidal thoughts. She denies any auditory or visual hallucination. There no psychotic symptoms present at this time. She's alert and oriented x3. Her insight judgment impulse control is okay her fund of knowledge is fair and her language skills are good  Lab Results:  Results for orders placed or performed in visit on 09/29/14 (from the past 8736 hour(s))  Basic Metabolic Panel (BMET)   Collection Time: 10/29/14 10:32 AM  Result Value Ref Range   Sodium 141 135 - 145 mEq/L   Potassium 3.6 3.5 - 5.3 mEq/L   Chloride 104 96 - 112 mEq/L   CO2 28 19 - 32 mEq/L   Glucose, Bld 95 70 - 99 mg/dL   BUN 17 6 - 23 mg/dL   Creat 1.10 0.50 - 1.10 mg/dL   Calcium 10.2 8.4 - 10.5 mg/dL  Magnesium   Collection Time: 10/29/14 10:32 AM  Result Value Ref Range   Magnesium 2.1 1.5 - 2.5 mg/dL  Results for orders placed or performed in visit on 09/27/14 (from the past 8736 hour(s))  Lipid Profile   Collection Time: 09/27/14 11:29 AM  Result Value Ref Range   Cholesterol 173 0 - 200 mg/dL   Triglycerides 88 <150 mg/dL   HDL 61 >=46 mg/dL   Total CHOL/HDL Ratio 2.8 Ratio   VLDL 18 0 - 40 mg/dL   LDL Cholesterol 94 0 - 99 mg/dL  Basic Metabolic Panel (BMET)   Collection Time: 09/27/14 11:29 AM  Result Value Ref Range   Sodium 141 135 - 145 mEq/L   Potassium 3.8 3.5 - 5.3 mEq/L   Chloride 101 96 - 112 mEq/L   CO2 29 19 - 32 mEq/L   Glucose, Bld 94 70 - 99 mg/dL   BUN 15 6 - 23 mg/dL   Creat 1.05 0.50 - 1.10 mg/dL   Calcium 9.8 8.4 - 10.5 mg/dL  CBC with Differential   Collection Time: 09/27/14 11:29 AM  Result Value Ref Range   WBC 5.8 4.0 - 10.5 K/uL   RBC 4.92 3.87 - 5.11 MIL/uL   Hemoglobin 12.7 12.0 - 15.0 g/dL   HCT 40.0 36.0 - 46.0 %   MCV 81.3 78.0 - 100.0 fL   MCH 25.8 (L) 26.0 - 34.0 pg  MCHC 31.8 30.0 - 36.0 g/dL   RDW 15.8 (H) 11.5 - 15.5 %   Platelets 207 150 - 400 K/uL   MPV 9.5 8.6 - 12.4 fL   Neutrophils Relative % 55 43 - 77 %   Neutro Abs 3.2 1.7  - 7.7 K/uL   Lymphocytes Relative 34 12 - 46 %   Lymphs Abs 2.0 0.7 - 4.0 K/uL   Monocytes Relative 8 3 - 12 %   Monocytes Absolute 0.5 0.1 - 1.0 K/uL   Eosinophils Relative 2 0 - 5 %   Eosinophils Absolute 0.1 0.0 - 0.7 K/uL   Basophils Relative 1 0 - 1 %   Basophils Absolute 0.1 0.0 - 0.1 K/uL   Smear Review Criteria for review not met   Magnesium   Collection Time: 09/27/14 11:29 AM  Result Value Ref Range   Magnesium 1.6 1.5 - 2.5 mg/dL   Review of systems is positive for bilateral knee pain  Assessment Axis I Maj. depressive disorder Axis II deferred Axis III see medical history Axis IV are to moderate.  Plan: I took her vitals.  I reviewed CC, tobacco/med/surg Hx, meds effects/ side effects, problem list, therapies and responses as well as current situation/symptoms discussed options. She will continue Viibryd  but increase the dose to 60 mg, for depression, continue Neurontin 300 mg 3 times a day and clonazepam 1 mg twice a day for anxiety . She will continue Ambien 10 mg daily at bedtime for sleep She'll return in 6 weeks to see if the increase in Viibryd was helpful  See orders and pt instructions for more details.  MEDICATIONS this encounter: Meds ordered this encounter  Medications  . Vilazodone HCl (VIIBRYD) 40 MG TABS    Sig: Take 1 tablet (40 mg total) by mouth every morning.    Dispense:  30 tablet    Refill:  2  . Vilazodone HCl (VIIBRYD) 20 MG TABS    Sig: Take 1 tablet (20 mg total) by mouth daily.    Dispense:  30 tablet    Refill:  2  . gabapentin (NEURONTIN) 300 MG capsule    Sig: Take 1 capsule (300 mg total) by mouth 3 (three) times daily.    Dispense:  90 capsule    Refill:  2  . clonazePAM (KLONOPIN) 1 MG tablet    Sig: Take 1 tablet (1 mg total) by mouth 2 (two) times daily.    Dispense:  60 tablet    Refill:  2  . zolpidem (AMBIEN) 10 MG tablet    Sig: Take 1 tablet (10 mg total) by mouth at bedtime.    Dispense:  30 tablet    Refill:  2     Medical Decision Making Problem Points:  Established problem, stable/improving (1), New problem, with additional work-up planned (4), Review of last therapy session (1) and Review of psycho-social stressors (1) Data Points:  Review or order clinical lab tests (1) Review of new medications or change in dosage (2)  I certify that outpatient services furnished can reasonably be expected to improve the patient's condition.   Levonne Spiller, MD

## 2015-03-02 ENCOUNTER — Telehealth (HOSPITAL_COMMUNITY): Payer: Self-pay | Admitting: *Deleted

## 2015-03-02 NOTE — Telephone Encounter (Signed)
phone call from [place], pharmacy.    they got two scripts Viibyrd 20 mg. and 40 mg.   Does she need to be on both?

## 2015-03-02 NOTE — Telephone Encounter (Signed)
phone call from pharmacy they received 2 E-Scripts for Viibyrd, one for 20 mg. and the other for 40 mg.

## 2015-03-03 NOTE — Telephone Encounter (Signed)
Per pt chart, pt is suppose to be on 60 mg total. Informed [name] and he showed understanding

## 2015-03-03 NOTE — Telephone Encounter (Signed)
Informed Jonni Sanger that pt is suppose to be taking 60 mg total due to what is on pt chart. Jonni Sanger showed understanding

## 2015-03-23 ENCOUNTER — Ambulatory Visit (HOSPITAL_COMMUNITY): Payer: Self-pay | Admitting: Psychiatry

## 2015-04-12 ENCOUNTER — Ambulatory Visit (HOSPITAL_COMMUNITY): Payer: Self-pay | Admitting: Psychiatry

## 2015-04-13 ENCOUNTER — Other Ambulatory Visit: Payer: Self-pay | Admitting: Cardiology

## 2015-04-25 ENCOUNTER — Ambulatory Visit (INDEPENDENT_AMBULATORY_CARE_PROVIDER_SITE_OTHER): Payer: Medicare Other | Admitting: Internal Medicine

## 2015-04-25 ENCOUNTER — Other Ambulatory Visit: Payer: Self-pay

## 2015-04-25 ENCOUNTER — Encounter: Payer: Self-pay | Admitting: Internal Medicine

## 2015-04-25 VITALS — BP 117/70 | HR 90 | Temp 97.6°F | Ht 62.0 in | Wt 209.6 lb

## 2015-04-25 DIAGNOSIS — K219 Gastro-esophageal reflux disease without esophagitis: Secondary | ICD-10-CM

## 2015-04-25 DIAGNOSIS — R1310 Dysphagia, unspecified: Secondary | ICD-10-CM

## 2015-04-25 DIAGNOSIS — R197 Diarrhea, unspecified: Secondary | ICD-10-CM

## 2015-04-25 MED ORDER — ONDANSETRON HCL 4 MG PO TABS: 4.0000 mg | ORAL_TABLET | Freq: Three times a day (TID) | ORAL | Status: DC | PRN

## 2015-04-25 NOTE — Patient Instructions (Signed)
Schedule EGD with possible esophageal dilation (history of granular cell tumor and GERD / Dysphagia) Will need Propofol  Stool sample for CDiff  Prescription for Zofran 4 mg tablets (disp #30 - 3 refills)  Take one every 8 hours as needed for nausea

## 2015-04-25 NOTE — Progress Notes (Signed)
Primary Care Physician:  Rosita Fire, MD Primary Gastroenterologist:  Dr. Gala Romney  Pre-Procedure History & Physical: HPI:  [name] is a 69 y.o. female here for GERD. Historically constipated, the past several months she has had diarrhea. Non-bloody. Multiple dental procedures. Multiple rounds of clindamycin per patient report. Stopped  Amitiza and oral magnesium without improvement in her symptoms. No fever or chills. GERD symptoms responsive to Dexilant 60 mg daily. Vague intermittent esophageal dysphagia to solids recently. 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   Past Medical History  Diagnosis Date  . Hypertension   . Hyperlipidemia   . Gastroesophageal reflux disease   . Diverticulosis   . Anxiety and depression   . Adenomatous colon polyp 2006    excised in 2006 & 2010Due surveillance 06/2013  . Chronic back pain   . Thyroid nodule   . Fibromyalgia   . Migraines   . Insomnia   . Chronic constipation   . Asthma   . History of benign esophageal tumor 2010    granular cell esophageal tumor (Dx 06/2008), resected via EMR 2011, due repeat EGD 02/2012  . Chest discomfort   . Hiatal hernia   . Depression   . Anxiety   . Sleep apnea     Stop Bang score of 5. Pt said she was told by Dr. Merlene Laughter that she had "a little bit" of sleep apena, but not bad enough to treat.  . Breast tumor     Past Surgical History  Procedure Laterality Date  . Tonsillectomy    . Abdominal hysterectomy    . Total knee arthroplasty  2002    Right; previous arthroscopic surgery  . Shoulder arthroscopy      Right; bone spurs removed  . Lung biopsy      negative  . 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 Right oophorectomy      benign disease  . Colonoscopy  06/2008, 06/2011    sigmoid tics, tubular adenoma; 2013: anal canal hemorrhoids  . Eus  08/2010    Dr [facility]  with EGD. Retained food. No recurrent esophageal lesion, bx negative.  . Colonoscopy  07/10/2011    Anal canal hemorrhoids likely the cause of hematochezia in the setting of constipation; otherwise normal rectum ;submucosal  petechiae in left colon of doubtful clinical significance; otherwise, normal colon  . Esophagogastroduodenoscopy  12/11/2011    [id number] Schatzki's ring; otherwise normal/Small hiatal hernia. Antral and bulbar erosions  . Bravo ph study  12/11/2011    Procedure: BRAVO [place] STUDY;  Surgeon: Daneil Dolin, MD;  Location: [facility];  Service: Endoscopy;  Laterality: N/A;  . Cholecystectomy N/A 04/09/2013    Procedure: LAPAROSCOPIC CHOLECYSTECTOMY;  Surgeon: Jamesetta So, MD;  Location: [facility] ORS;  Service: General;  Laterality: N/A;    Prior to Admission medications   Medication Sig Start Date End Date Taking? Authorizing Provider  albuterol (PROVENTIL HFA;VENTOLIN HFA) 108 (90 BASE) MCG/ACT inhaler Inhale 2 puffs into the lungs every 6 (six) hours as needed. For shortness of breath   Yes Historical Provider, MD  alendronate (FOSAMAX) 70 MG tablet 70 mg once a week. 02/24/14  Yes Historical Provider, MD  amLODipine (NORVASC) 5 MG tablet Take 10 mg by mouth daily.  01/21/13  Yes Yehuda Savannah, MD  atenolol (TENORMIN) 50 MG tablet Take 1 tablet (50 mg total) by  mouth 2 (two) times daily. 04/13/15  Yes Arnoldo Lenis, MD  clonazePAM (KLONOPIN) 1 MG tablet Take 1 tablet (1 mg total) by mouth 2 (two) times daily. 03/01/15 02/29/16 Yes Cloria Spring, MD  dexlansoprazole (DEXILANT) 60 MG capsule Take 1 capsule (60 mg total) by mouth daily. Patient taking differently: Take 60 mg by mouth 2 (two) times daily.  04/12/14  Yes Mahala Menghini, PA-C  diclofenac (CATAFLAM) 50 MG tablet Take 1 tablet (50 mg total) by mouth 2 (two) times daily. 12/15/14  Yes Carole Civil, MD  furosemide (LASIX) 40 MG tablet May take daily AS NEEDED for swelling 08/30/14  Yes Arnoldo Lenis, MD    gabapentin (NEURONTIN) 300 MG capsule Take 1 capsule (300 mg total) by mouth 3 (three) times daily. 03/01/15  Yes Cloria Spring, MD  lisinopril-hydrochlorothiazide (PRINZIDE,ZESTORETIC) 20-25 MG per tablet Take 1 tablet by mouth daily. 10/28/14  Yes Lendon Colonel, NP  lovastatin (MEVACOR) 40 MG tablet TAKE ONE TABLET BY MOUTH EVERY NIGHT AT BEDTIME 12/07/14  Yes Lendon Colonel, NP  Olopatadine HCl 0.2 % SOLN Apply to eye.   Yes Historical Provider, MD  potassium chloride SA (K-DUR,KLOR-CON) 20 MEQ tablet TAKE ONE (1) TABLET EACH DAY 01/24/15  Yes Lendon Colonel, NP  Vilazodone HCl (VIIBRYD) 40 MG TABS Take 1 tablet (40 mg total) by mouth every morning. 03/01/15  Yes Cloria Spring, MD  Vitamin D, Ergocalciferol, (DRISDOL) 50000 UNITS CAPS capsule Take 50,000 Units by mouth every 30 (thirty) days.    Yes Historical Provider, MD  zolpidem (AMBIEN) 10 MG tablet Take 1 tablet (10 mg total) by mouth at bedtime. 03/01/15  Yes Cloria Spring, MD  lubiprostone (AMITIZA) 8 MCG capsule Take 1 capsule (8 mcg total) by mouth 2 (two) times daily with a meal. Patient not taking: Reported on 04/25/2015 04/12/14   Mahala Menghini, PA-C  magnesium oxide (MAG-OX) 400 MG tablet Take 400 mg by mouth daily.    Historical Provider, MD  nystatin ointment (MYCOSTATIN) Apply 1 application topically 2 (two) times daily.    Historical Provider, MD  potassium chloride SA (K-DUR,KLOR-CON) 20 MEQ tablet Take 20 mEq by mouth once.     Historical Provider, MD  promethazine (PHENERGAN) 25 MG tablet Take 25 mg by mouth every 6 (six) hours as needed for nausea or vomiting.    Historical Provider, MD  Vilazodone HCl (VIIBRYD) 20 MG TABS Take 1 tablet (20 mg total) by mouth daily. Patient not taking: Reported on 04/25/2015 03/01/15   Cloria Spring, MD    Allergies as of 04/25/2015 - Review Complete 04/25/2015  Allergen Reaction Noted  . Elavil [amitriptyline] Other (See Comments) 06/03/2012  . Abilify [aripiprazole] Other (See  Comments) 11/25/2012  . Trazodone and nefazodone Other (See Comments) 08/31/2012  . Codeine Hives, Nausea Only, and Other (See Comments)   . Latex Hives 11/21/2011  . Penicillin v Itching 08/02/2013  . Penicillins Hives   . Polyethylene glycol Other (See Comments) 12/26/2012  . Sulfonamide derivatives Nausea And Vomiting   . Cymbalta [duloxetine hcl] Rash 03/10/2013  . Remeron [mirtazapine] Other (See Comments) 05/06/2012    Family History  Problem Relation Age of Onset  . Stroke Father   . Alcohol abuse Father   . Heart attack Mother   . Depression Mother   . Anxiety disorder Mother   . Colon cancer Paternal Aunt   . Colon cancer Paternal Uncle   . Dementia Maternal Uncle   .  ADD / ADHD Neg Hx   . Bipolar disorder Neg Hx   . Drug abuse Neg Hx   . OCD Neg Hx   . Paranoid behavior Neg Hx   . Schizophrenia Neg Hx   . Seizures Neg Hx   . Sexual abuse Neg Hx   . Physical abuse Neg Hx     Social History   Social History  . Marital Status: Single    Spouse Name: N/A  . Number of Children: 1  . Years of Education: N/A   Occupational History  . disabled    Social History Main Topics  . Smoking status: Never Smoker   . Smokeless tobacco: Never Used  . Alcohol Use: No  . Drug Use: No  . Sexual Activity: No   Other Topics Concern  . Not on file   Social History Narrative    Review of Systems: See HPI, otherwise negative ROS  Physical Exam: BP 117/70 mmHg  Pulse 90  Temp(Src) 97.6 F (36.4 C) (Oral)  Ht 5\' 2"  (1.575 m)  Wt 209 lb 9.6 oz (95.074 kg)  BMI 38.33 kg/m2 General:    pleasant and cooperative in NAD Neck:  Supple; no masses or thyromegaly. No significant cervical adenopathy. Lungs:  Clear throughout to auscultation.   No wheezes, crackles, or rhonchi. No acute distress. Heart:  Regular rate and rhythm; no murmurs, clicks, rubs,  or gallops. Abdomen: Non-distended, normal bowel sounds.  Soft and nontender without appreciable mass or  hepatosplenomegaly.  Pulses:  Normal pulses noted. Extremities:  Without clubbing or edema.  Impression:  69 year old lady with long-standing GERD now with vague esophageal dysphagia to solids. Distant history of a granular cell tumor of the esophagus status post EMR years ago with negative follow-up EGD in 2013. Reflux symptoms fairly well controlled on Dexilant 60 mg daily. History of chronic constipation now reverted to diarrhea over the past 4 months or so. Multiple dental procedures. Clindamycin administration by history. Amitiza and oral magnesium supplements stopped due to diarrhea.  The possibility of Clostridium difficile infection needs to be considered along with other entities. History of colonic adenoma; due for surveillance colonoscopy 2018 at this point in time.   Recommendations:  Schedule EGD with possible esophageal dilation (history of granular cell tumor and GERD / Dysphagia) Will need deep sedation given psychiatric issues and polypharmacy.  The risks, benefits, limitations, alternatives and imponderables have been reviewed with the patient. Potential for esophageal dilation, biopsy, etc. have also been reviewed.  Questions have been answered. All parties agreeable.  Stool sample for CDiff.  Prescription for Zofran 4 mg tablets (disp #30 - 3 refills)  Take one every 8 hours as needed for nausea    Notice: This dictation was prepared with Dragon dictation along with smaller phrase technology. Any transcriptional errors that result from this process are unintentional and may not be corrected upon review.

## 2015-04-28 ENCOUNTER — Other Ambulatory Visit: Payer: Self-pay | Admitting: Gastroenterology

## 2015-04-28 NOTE — Patient Instructions (Signed)
[name]  04/28/2015     @PREFPERIOPPHARMACY @   Your procedure is scheduled on  05/08/2015   Report to [name] at  615  A.M.  Call this number if you have problems the morning of surgery:  [phone]   Remember:  Do not eat food or drink liquids after midnight.  Take these medicines the morning of surgery with A SIP OF WATER  Norvasc, atenolol, klonopin, dexilant, gabapentin, lisinopril, zofran(or phenergan), viibryd. Take your inhaler before you come and bring it with you.   Do not wear jewelry, make-up or nail polish.  Do not wear lotions, powders, or perfumes.  You may wear deodorant.  Do not shave 48 hours prior to surgery.  Men may shave face and neck.  Do not bring valuables to the hospital.  [facility] is not responsible for any belongings or valuables.  Contacts, dentures or bridgework may not be worn into surgery.  Leave your suitcase in the car.  After surgery it may be brought to your room.  For patients admitted to the hospital, discharge time will be determined by your treatment team.  Patients discharged the day of surgery will not be allowed to drive home.   Name and phone number of your driver:   family Special instructions:  Follow the diet instructions given to you by Dr Roseanne Kaufman office.  Please read over the following fact sheets that you were given. Pain Booklet, Coughing and Deep Breathing, Surgical Site Infection Prevention, Anesthesia Post-op Instructions and Care and Recovery After Surgery      Esophagogastroduodenoscopy Esophagogastroduodenoscopy (EGD) is a procedure that is used to examine the lining of the esophagus, stomach, and first part of the small intestine (duodenum). A long, flexible, lighted tube with a camera attached (endoscope) is inserted down the throat to view these organs. This procedure is done to detect problems or abnormalities, such as inflammation, bleeding, ulcers, or growths, in order to treat them. The  procedure lasts 5-20 minutes. It is usually an outpatient procedure, but it may need to be performed in a hospital in emergency cases. LET [facility] CARE PROVIDER KNOW ABOUT:  Any allergies you have.  All medicines you are taking, including vitamins, herbs, eye drops, creams, and over-the-counter medicines.  Previous problems you or members of your family have had with the use of anesthetics.  Any blood disorders you have.  Previous surgeries you have had.  Medical conditions you have. RISKS AND COMPLICATIONS Generally, this is a safe procedure. However, problems can occur and include:  Infection.  Bleeding.  Tearing (perforation) of the esophagus, stomach, or duodenum.  Difficulty breathing or not being able to breathe.  Excessive sweating.  Spasms of the larynx.  Slowed heartbeat.  Low blood pressure. BEFORE THE PROCEDURE  Do not eat or drink anything after midnight on the night before the procedure or as directed by your health care provider.  Do not take your regular medicines before the procedure if your health care provider asks you not to. Ask your health care provider about changing or stopping those medicines.  If you wear dentures, be prepared to remove them before the procedure.  Arrange for someone to drive you home after the procedure. PROCEDURE  A numbing medicine (local anesthetic) may be sprayed in your throat for comfort and to stop you from gagging or coughing.  You will have an IV tube inserted in a vein in your hand or arm. You will  receive medicines and fluids through this tube.  You will be given a medicine to relax you (sedative).  A pain reliever will be given through the IV tube.  A mouth guard may be placed in your mouth to protect your teeth and to keep you from biting on the endoscope.  You will be asked to lie on your left side.  The endoscope will be inserted down your throat and into your esophagus, stomach, and duodenum.  Air  will be put through the endoscope to allow your health care provider to clearly view the lining of your esophagus.  The lining of your esophagus, stomach, and duodenum will be examined. During the exam, your health care provider may:  Remove tissue to be examined under a microscope (biopsy) for inflammation, infection, or other medical problems.  Remove growths.  Remove objects (foreign bodies) that are stuck.  Treat any bleeding with medicines or other devices that stop tissues from bleeding (hot cautery, clipping devices).  Widen (dilate) or stretch narrowed areas of your esophagus and stomach.  The endoscope will be withdrawn. AFTER THE PROCEDURE  You will be taken to a recovery area for observation. Your blood pressure, heart rate, breathing rate, and blood oxygen level will be monitored often until the medicines you were given have worn off.  Do not eat or drink anything until the numbing medicine has worn off and your gag reflex has returned. You may choke.  Your health care provider should be able to discuss his or her findings with you. It will take longer to discuss the test results if any biopsies were taken.   This information is not intended to replace advice given to you by your health care provider. Make sure you discuss any questions you have with your health care provider.   Document Released: 10/11/2004 Document Revised: 07/01/2014 Document Reviewed: 05/13/2012 Elsevier Interactive Patient Education 2016 [employer] Esophageal Dilatation Esophageal dilatation is a procedure to open a blocked or narrowed part of the esophagus. The esophagus is the long tube in your throat that carries food and liquid from your mouth to your stomach. The procedure is also called esophageal dilation.  You may need this procedure if you have a buildup of scar tissue in your esophagus that makes it difficult, painful, or even impossible to swallow. This can be caused by gastroesophageal  reflux disease (GERD). In rare cases, people need this procedure because they have cancer of the esophagus or a problem with the way food moves through the esophagus. Sometimes you may need to have another dilatation to enlarge the opening of the esophagus gradually. LET [facility] CARE PROVIDER KNOW ABOUT:   Any allergies you have.  All medicines you are taking, including vitamins, herbs, eye drops, creams, and over-the-counter medicines.  Previous problems you or members of your family have had with the use of anesthetics.  Any blood disorders you have.  Previous surgeries you have had.  Medical conditions you have.  Any antibiotic medicines you are required to take before dental procedures. RISKS AND COMPLICATIONS Generally, this is a safe procedure. However, problems can occur and include:  Bleeding from a tear in the lining of the esophagus.  A hole (perforation) in the esophagus. BEFORE THE PROCEDURE  Do not eat or drink anything after midnight on the night before the procedure or as directed by your health care provider.  Ask your health care provider about changing or stopping your regular medicines. This is especially important if you are  taking diabetes medicines or blood thinners.  Plan to have someone take you home after the procedure. PROCEDURE   You will be given a medicine that makes you relaxed and sleepy (sedative).  A medicine may be sprayed or gargled to numb the back of the throat.  Your health care provider can use various instruments to do an esophageal dilatation. During the procedure, the instrument used will be placed in your mouth and passed down into your esophagus. Options include:  Simple dilators. This instrument is carefully placed in the esophagus to stretch it.  Guided wire bougies. In this method, a flexible tube (endoscope) is used to insert a wire into the esophagus. The dilator is passed over this wire to enlarge the esophagus. Then the  wire is removed.  Balloon dilators. An endoscope with a small balloon at the end is passed down into the esophagus. Inflating the balloon gently stretches the esophagus and opens it up. AFTER THE PROCEDURE  Your blood pressure, heart rate, breathing rate, and blood oxygen level will be monitored often until the medicines you were given have worn off.  Your throat may feel slightly sore and will probably still feel numb. This will improve slowly over time.  You will not be allowed to eat or drink until the throat numbness has resolved.  If this is a same-day procedure, you may be allowed to go home once you have been able to drink, urinate, and sit on the edge of the bed without nausea or dizziness.  If this is a same-day procedure, you should have a friend or family member with you for the next 24 hours after the procedure.   This information is not intended to replace advice given to you by your health care provider. Make sure you discuss any questions you have with your health care provider.   Document Released: 08/01/2005 Document Revised: 07/01/2014 Document Reviewed: 10/20/2013 Elsevier Interactive Patient Education 2016 [employer] PATIENT INSTRUCTIONS POST-ANESTHESIA  IMMEDIATELY FOLLOWING SURGERY:  Do not drive or operate machinery for the first twenty four hours after surgery.  Do not make any important decisions for twenty four hours after surgery or while taking narcotic pain medications or sedatives.  If you develop intractable nausea and vomiting or a severe headache please notify your doctor immediately.  FOLLOW-UP:  Please make an appointment with your surgeon as instructed. You do not need to follow up with anesthesia unless specifically instructed to do so.  WOUND CARE INSTRUCTIONS (if applicable):  Keep a dry clean dressing on the anesthesia/puncture wound site if there is drainage.  Once the wound has quit draining you may leave it open to air.  Generally you should  leave the bandage intact for twenty four hours unless there is drainage.  If the epidural site drains for more than 36-48 hours please call the anesthesia department.  QUESTIONS?:  Please feel free to call your physician or the hospital operator if you have any questions, and they will be happy to assist you.

## 2015-05-01 ENCOUNTER — Encounter (HOSPITAL_COMMUNITY): Payer: Self-pay

## 2015-05-01 ENCOUNTER — Other Ambulatory Visit: Payer: Self-pay

## 2015-05-01 ENCOUNTER — Encounter (HOSPITAL_COMMUNITY)
Admission: RE | Admit: 2015-05-01 | Discharge: 2015-05-01 | Disposition: A | Discharge status: Home / Self Care | Payer: Medicare Other | Source: Ambulatory Visit | Attending: Internal Medicine | Admitting: Internal Medicine

## 2015-05-01 DIAGNOSIS — Z01818 Encounter for other preprocedural examination: Secondary | ICD-10-CM | POA: Diagnosis not present

## 2015-05-01 DIAGNOSIS — R1310 Dysphagia, unspecified: Secondary | ICD-10-CM | POA: Diagnosis not present

## 2015-05-01 DIAGNOSIS — K219 Gastro-esophageal reflux disease without esophagitis: Secondary | ICD-10-CM | POA: Insufficient documentation

## 2015-05-01 LAB — BASIC METABOLIC PANEL
Anion gap: 8 (ref 5–15)
BUN: 10 mg/dL (ref 6–20)
CO2: 26 mmol/L (ref 22–32)
Calcium: 10.2 mg/dL (ref 8.9–10.3)
Chloride: 107 mmol/L (ref 101–111)
Creatinine, Ser: 1.13 mg/dL — ABNORMAL HIGH (ref 0.44–1.00)
GFR calc Af Amer: 56 mL/min — ABNORMAL LOW (ref >60)
GFR calc non Af Amer: 48 mL/min — ABNORMAL LOW (ref >60)
Glucose, Bld: 95 mg/dL (ref 65–99)
Potassium: 3.9 mmol/L (ref 3.5–5.1)
Sodium: 141 mmol/L (ref 135–145)

## 2015-05-01 LAB — CBC WITH DIFFERENTIAL/PLATELET
Basophils Absolute: 0 10*3/uL (ref 0.0–0.1)
Basophils Relative: 0 %
Eosinophils Absolute: 0.1 10*3/uL (ref 0.0–0.7)
Eosinophils Relative: 2 %
HCT: 39.5 % (ref 36.0–46.0)
Hemoglobin: 12.5 g/dL (ref 12.0–15.0)
Lymphocytes Relative: 39 %
Lymphs Abs: 1.8 10*3/uL (ref 0.7–4.0)
MCH: 26.5 pg (ref 26.0–34.0)
MCHC: 31.6 g/dL (ref 30.0–36.0)
MCV: 83.9 fL (ref 78.0–100.0)
Monocytes Absolute: 0.3 10*3/uL (ref 0.1–1.0)
Monocytes Relative: 6 %
Neutro Abs: 2.4 10*3/uL (ref 1.7–7.7)
Neutrophils Relative %: 53 %
Platelets: 188 10*3/uL (ref 150–400)
RBC: 4.71 MIL/uL (ref 3.87–5.11)
RDW: 15 % (ref 11.5–15.5)
WBC: 4.6 10*3/uL (ref 4.0–10.5)

## 2015-05-02 ENCOUNTER — Other Ambulatory Visit: Payer: Self-pay | Admitting: Gastroenterology

## 2015-05-03 DIAGNOSIS — R197 Diarrhea, unspecified: Secondary | ICD-10-CM | POA: Diagnosis not present

## 2015-05-05 LAB — CLOSTRIDIUM DIFFICILE BY PCR: Toxigenic C. Difficile by PCR: NOT DETECTED

## 2015-05-08 ENCOUNTER — Ambulatory Visit (HOSPITAL_COMMUNITY): Payer: Medicare Other | Admitting: Anesthesiology

## 2015-05-08 ENCOUNTER — Encounter (HOSPITAL_COMMUNITY)
Admission: RE | Disposition: A | Discharge status: Home / Self Care | Payer: Self-pay | Source: Ambulatory Visit | Attending: Internal Medicine

## 2015-05-08 ENCOUNTER — Encounter (HOSPITAL_COMMUNITY): Payer: Self-pay

## 2015-05-08 ENCOUNTER — Ambulatory Visit (HOSPITAL_COMMUNITY)
Admission: RE | Admit: 2015-05-08 | Discharge: 2015-05-08 | Disposition: A | Discharge status: Home / Self Care | Payer: Medicare Other | Source: Ambulatory Visit | Attending: Internal Medicine | Admitting: Internal Medicine

## 2015-05-08 DIAGNOSIS — Z79899 Other long term (current) drug therapy: Secondary | ICD-10-CM | POA: Insufficient documentation

## 2015-05-08 DIAGNOSIS — Z96651 Presence of right artificial knee joint: Secondary | ICD-10-CM | POA: Insufficient documentation

## 2015-05-08 DIAGNOSIS — G4730 Sleep apnea, unspecified: Secondary | ICD-10-CM | POA: Insufficient documentation

## 2015-05-08 DIAGNOSIS — K2210 Ulcer of esophagus without bleeding: Secondary | ICD-10-CM | POA: Insufficient documentation

## 2015-05-08 DIAGNOSIS — K219 Gastro-esophageal reflux disease without esophagitis: Secondary | ICD-10-CM | POA: Diagnosis not present

## 2015-05-08 DIAGNOSIS — K449 Diaphragmatic hernia without obstruction or gangrene: Secondary | ICD-10-CM | POA: Diagnosis not present

## 2015-05-08 DIAGNOSIS — Q394 Esophageal web: Secondary | ICD-10-CM | POA: Diagnosis not present

## 2015-05-08 DIAGNOSIS — K210 Gastro-esophageal reflux disease with esophagitis: Secondary | ICD-10-CM | POA: Insufficient documentation

## 2015-05-08 DIAGNOSIS — J45909 Unspecified asthma, uncomplicated: Secondary | ICD-10-CM | POA: Insufficient documentation

## 2015-05-08 DIAGNOSIS — Z86018 Personal history of other benign neoplasm: Secondary | ICD-10-CM | POA: Diagnosis not present

## 2015-05-08 DIAGNOSIS — R1310 Dysphagia, unspecified: Secondary | ICD-10-CM | POA: Insufficient documentation

## 2015-05-08 DIAGNOSIS — I10 Essential (primary) hypertension: Secondary | ICD-10-CM | POA: Diagnosis not present

## 2015-05-08 DIAGNOSIS — F418 Other specified anxiety disorders: Secondary | ICD-10-CM | POA: Diagnosis not present

## 2015-05-08 DIAGNOSIS — E785 Hyperlipidemia, unspecified: Secondary | ICD-10-CM | POA: Diagnosis not present

## 2015-05-08 DIAGNOSIS — M797 Fibromyalgia: Secondary | ICD-10-CM | POA: Insufficient documentation

## 2015-05-08 DIAGNOSIS — K222 Esophageal obstruction: Secondary | ICD-10-CM | POA: Insufficient documentation

## 2015-05-08 HISTORY — PX: ESOPHAGOGASTRODUODENOSCOPY (EGD) WITH PROPOFOL: SHX5813

## 2015-05-08 HISTORY — DX: Other complications of anesthesia, initial encounter: T88.59XA

## 2015-05-08 HISTORY — DX: Other specified postprocedural states: Z98.890

## 2015-05-08 HISTORY — PX: ESOPHAGEAL DILATION: SHX303

## 2015-05-08 HISTORY — DX: Other specified postprocedural states: R11.2

## 2015-05-08 HISTORY — DX: Adverse effect of unspecified anesthetic, initial encounter: T41.45XA

## 2015-05-08 SURGERY — ESOPHAGOGASTRODUODENOSCOPY (EGD) WITH PROPOFOL
Anesthesia: Monitor Anesthesia Care

## 2015-05-08 MED ORDER — ONDANSETRON HCL 4 MG/2ML IJ SOLN: 4.0000 mg | Freq: Once | INTRAMUSCULAR | Status: DC | PRN

## 2015-05-08 MED ORDER — MIDAZOLAM HCL 2 MG/2ML IJ SOLN: 1.0000 mg | INTRAMUSCULAR | Status: DC | PRN

## 2015-05-08 MED ORDER — LIDOCAINE VISCOUS 2 % MT SOLN
OROMUCOSAL | Status: AC
  Filled 2015-05-08: qty 15

## 2015-05-08 MED ORDER — LACTATED RINGERS IV SOLN
INTRAVENOUS | Status: DC
  Administered 2015-05-08: 07:00:00 via INTRAVENOUS

## 2015-05-08 MED ORDER — MIDAZOLAM HCL 2 MG/2ML IJ SOLN
INTRAMUSCULAR | Status: AC
  Filled 2015-05-08: qty 2

## 2015-05-08 MED ORDER — LIDOCAINE VISCOUS 2 % MT SOLN
15.0000 mL | Freq: Once | OROMUCOSAL | Status: AC
  Administered 2015-05-08: 15 mL via OROMUCOSAL

## 2015-05-08 MED ORDER — MIDAZOLAM HCL 5 MG/5ML IJ SOLN
INTRAMUSCULAR | Status: DC | PRN
  Administered 2015-05-08 (×2): 1 mg via INTRAVENOUS

## 2015-05-08 MED ORDER — PROPOFOL 500 MG/50ML IV EMUL
INTRAVENOUS | Status: DC | PRN
  Administered 2015-05-08: 125 ug/kg/min via INTRAVENOUS

## 2015-05-08 MED ORDER — MIDAZOLAM HCL 2 MG/2ML IJ SOLN
INTRAMUSCULAR | Status: AC
  Filled 2015-05-08: qty 4

## 2015-05-08 MED ORDER — PROPOFOL 10 MG/ML IV BOLUS
INTRAVENOUS | Status: AC
Start: 2015-05-08 — End: 2015-05-08
  Filled 2015-05-08: qty 20

## 2015-05-08 MED ORDER — FENTANYL CITRATE (PF) 100 MCG/2ML IJ SOLN: 25.0000 ug | INTRAMUSCULAR | Status: DC | PRN

## 2015-05-08 MED ORDER — STERILE WATER FOR IRRIGATION IR SOLN
Status: DC | PRN
  Administered 2015-05-08: 1000 mL

## 2015-05-08 MED ORDER — LIDOCAINE HCL (PF) 1 % IJ SOLN
INTRAMUSCULAR | Status: AC
Start: 2015-05-08 — End: 2015-05-08
  Filled 2015-05-08: qty 5

## 2015-05-08 MED ORDER — PROPOFOL 10 MG/ML IV BOLUS
INTRAVENOUS | Status: AC
  Filled 2015-05-08: qty 20

## 2015-05-08 MED ORDER — SCOPOLAMINE 1 MG/3DAYS TD PT72
1.0000 | MEDICATED_PATCH | Freq: Once | TRANSDERMAL | Status: DC
  Administered 2015-05-08: 1.5 mg via TRANSDERMAL

## 2015-05-08 MED ORDER — SCOPOLAMINE 1 MG/3DAYS TD PT72
MEDICATED_PATCH | TRANSDERMAL | Status: AC
Start: 2015-05-08 — End: 2015-05-08
  Filled 2015-05-08: qty 1

## 2015-05-08 SURGICAL SUPPLY — 30 items
BALLN CRE LF 10-12 240X5.5 (BALLOONS)
BALLN DILATOR CRE 12-15 240 (BALLOONS)
BALLN DILATOR CRE 15-18 240 (BALLOONS) IMPLANT
BALLN DILATOR CRE 18-20 240 (BALLOONS) IMPLANT
BALLN DILATOR CRE WIREGUIDE (BALLOONS)
BALLOON CRE LF 10-12 240X5.5 (BALLOONS) IMPLANT
BALLOON DILATOR CRE 12-15 240 (BALLOONS) IMPLANT
BALLOON DILATOR CRE WIREGUIDE (BALLOONS) IMPLANT
BLOCK BITE 60FR ADLT L/F BLUE (MISCELLANEOUS) ×2 IMPLANT
DEVICE CLIP HEMOSTAT 235CM (CLIP) IMPLANT
ELECT REM PT RETURN 9FT ADLT (ELECTROSURGICAL)
ELECTRODE REM PT RTRN 9FT ADLT (ELECTROSURGICAL) IMPLANT
FLOOR PAD 36X40 (MISCELLANEOUS) ×2
FORCEPS BIOP RAD 4 LRG CAP 4 (CUTTING FORCEPS) ×2 IMPLANT
FORMALIN 10 PREFIL 20ML (MISCELLANEOUS) IMPLANT
KIT ENDO PROCEDURE PEN (KITS) ×2 IMPLANT
MANIFOLD NEPTUNE II (INSTRUMENTS) ×2 IMPLANT
NEEDLE SCLEROTHERAPY 25GX240 (NEEDLE) IMPLANT
PAD FLOOR 36X40 (MISCELLANEOUS) ×1 IMPLANT
PROBE APC STR FIRE (PROBE) IMPLANT
PROBE INJECTION GOLD (MISCELLANEOUS)
PROBE INJECTION GOLD 7FR (MISCELLANEOUS) IMPLANT
SNARE ROTATE MED OVAL 20MM (MISCELLANEOUS) IMPLANT
SNARE SHORT THROW 13M SML OVAL (MISCELLANEOUS) IMPLANT
SYR 50ML LL SCALE MARK (SYRINGE) ×2 IMPLANT
SYR INFLATE BILIARY GAUGE (MISCELLANEOUS) IMPLANT
SYR INFLATION 60ML (SYRINGE) IMPLANT
TUBING INSUFFLATOR CO2MPACT (TUBING) ×2 IMPLANT
TUBING IRRIGATION ENDOGATOR (MISCELLANEOUS) ×2 IMPLANT
WATER STERILE IRR 1000ML POUR (IV SOLUTION) ×2 IMPLANT

## 2015-05-08 NOTE — Discharge Instructions (Signed)
EGD Discharge instructions Please read the instructions outlined below and refer to this sheet in the next few weeks. These discharge instructions provide you with general information on caring for yourself after you leave the hospital. Your doctor may also give you specific instructions. While your treatment has been planned according to the most current medical practices available, unavoidable complications occasionally occur. If you have any problems or questions after discharge, please call your doctor. ACTIVITY  You may resume your regular activity but move at a slower pace for the next 24 hours.   Take frequent rest periods for the next 24 hours.   Walking will help expel (get rid of) the air and reduce the bloated feeling in your abdomen.   No driving for 24 hours (because of the anesthesia (medicine) used during the test).   You may shower.   Do not sign any important legal documents or operate any machinery for 24 hours (because of the anesthesia used during the test).  NUTRITION  Drink plenty of fluids.   You may resume your normal diet.   Begin with a light meal and progress to your normal diet.   Avoid alcoholic beverages for 24 hours or as instructed by your caregiver.  MEDICATIONS  You may resume your normal medications unless your caregiver tells you otherwise.  WHAT YOU CAN EXPECT TODAY  You may experience abdominal discomfort such as a feeling of fullness or gas pains.  FOLLOW-UP  Your doctor will discuss the results of your test with you.  SEEK IMMEDIATE MEDICAL ATTENTION IF ANY OF THE FOLLOWING OCCUR:  Excessive nausea (feeling sick to your stomach) and/or vomiting.   Severe abdominal pain and distention (swelling).   Trouble swallowing.   Temperature over 101 F (37.8 C).   Rectal bleeding or vomiting of blood.     GERD information provided  Continue Dexilant 60 mg daily  Carafate suspension 1 g 4 times daily 2 weeks  See Dr. Lenard Simmer about  stopping Fosamax and going with a "shot form" of medicine for treating osteoporosis  Office visit with [place] in 3 months     Gastroesophageal Reflux Disease, Adult Normally, food travels down the esophagus and stays in the stomach to be digested. However, when a person has gastroesophageal reflux disease (GERD), food and stomach acid move back up into the esophagus. When this happens, the esophagus becomes sore and inflamed. Over time, GERD can create small holes (ulcers) in the lining of the esophagus.  CAUSES This condition is caused by a problem with the muscle between the esophagus and the stomach (lower esophageal sphincter, or LES). Normally, the LES muscle closes after food passes through the esophagus to the stomach. When the LES is weakened or abnormal, it does not close properly, and that allows food and stomach acid to go back up into the esophagus. The LES can be weakened by certain dietary substances, medicines, and medical conditions, including:  Tobacco use.  Pregnancy.  Having a hiatal hernia.  Heavy alcohol use.  Certain foods and beverages, such as coffee, chocolate, onions, and peppermint. RISK FACTORS This condition is more likely to develop in:  People who have an increased body weight.  People who have connective tissue disorders.  People who use NSAID medicines. SYMPTOMS Symptoms of this condition include:  Heartburn.  Difficult or painful swallowing.  The feeling of having a lump in the throat.  Abitter taste in the mouth.  Bad breath.  Having a large amount of saliva.  Having an  upset or bloated stomach.  Belching.  Chest pain.  Shortness of breath or wheezing.  Ongoing (chronic) cough or a night-time cough.  Wearing away of tooth enamel.  Weight loss. Different conditions can cause chest pain. Make sure to see your health care provider if you experience chest pain. DIAGNOSIS Your health care provider will take a medical history and  perform a physical exam. To determine if you have mild or severe GERD, your health care provider may also monitor how you respond to treatment. You may also have other tests, including:  An endoscopy toexamine your stomach and esophagus with a small camera.  A test thatmeasures the acidity level in your esophagus.  A test thatmeasures how much pressure is on your esophagus.  A barium swallow or modified barium swallow to show the shape, size, and functioning of your esophagus. TREATMENT The goal of treatment is to help relieve your symptoms and to prevent complications. Treatment for this condition may vary depending on how severe your symptoms are. Your health care provider may recommend:  Changes to your diet.  Medicine.  Surgery. HOME CARE INSTRUCTIONS Diet  Follow a diet as recommended by your health care provider. This may involve avoiding foods and drinks such as:  Coffee and tea (with or without caffeine).  Drinks that containalcohol.  Energy drinks and sports drinks.  Carbonated drinks or sodas.  Chocolate and cocoa.  Peppermint and mint flavorings.  Garlic and onions.  Horseradish.  Spicy and acidic foods, including peppers, chili powder, curry powder, vinegar, hot sauces, and barbecue sauce.  Citrus fruit juices and citrus fruits, such as oranges, lemons, and limes.  Tomato-based foods, such as red sauce, chili, salsa, and pizza with red sauce.  Fried and fatty foods, such as donuts, french fries, potato chips, and high-fat dressings.  High-fat meats, such as hot dogs and fatty cuts of red and white meats, such as rib eye steak, sausage, ham, and bacon.  High-fat dairy items, such as whole milk, butter, and cream cheese.  Eat small, frequent meals instead of large meals.  Avoid drinking large amounts of liquid with your meals.  Avoid eating meals during the 2-3 hours before bedtime.  Avoid lying down right after you eat.  Do not exercise right  after you eat. General Instructions  Pay attention to any changes in your symptoms.  Take over-the-counter and prescription medicines only as told by your health care provider. Do not take aspirin, ibuprofen, or other NSAIDs unless your health care provider told you to do so.  Do not use any tobacco products, including cigarettes, chewing tobacco, and e-cigarettes. If you need help quitting, ask your health care provider.  Wear loose-fitting clothing. Do not wear anything tight around your waist that causes pressure on your abdomen.  Raise (elevate) the head of your bed 6 inches (15cm).  Try to reduce your stress, such as with yoga or meditation. If you need help reducing stress, ask your health care provider.  If you are overweight, reduce your weight to an amount that is healthy for you. Ask your health care provider for guidance about a safe weight loss goal.  Keep all follow-up visits as told by your health care provider. This is important. SEEK MEDICAL CARE IF:  You have new symptoms.  You have unexplained weight loss.  You have difficulty swallowing, or it hurts to swallow.  You have wheezing or a persistent cough.  Your symptoms do not improve with treatment.  You have a hoarse  voice. SEEK IMMEDIATE MEDICAL CARE IF:  You have pain in your arms, neck, jaw, teeth, or back.  You feel sweaty, dizzy, or light-headed.  You have chest pain or shortness of breath.  You vomit and your vomit looks like blood or coffee grounds.  You faint.  Your stool is bloody or black.  You cannot swallow, drink, or eat.   This information is not intended to replace advice given to you by your health care provider. Make sure you discuss any questions you have with your health care provider.   Document Released: 03/20/2005 Document Revised: 03/01/2015 Document Reviewed: 10/05/2014 Elsevier Interactive Patient Education [employer].

## 2015-05-08 NOTE — H&P (View-Only) (Signed)
  Primary Care Physician:  FANTA,TESFAYE, MD Primary Gastroenterologist:  Dr. Jennae Hakeem  Pre-Procedure History & Physical: HPI:  [name] is a 69 y.o. female here for GERD. Historically constipated, the past several months she has had diarrhea. Non-bloody. Multiple dental procedures. Multiple rounds of clindamycin per patient report. Stopped  Amitiza and oral magnesium without improvement in her symptoms. No fever or chills. GERD symptoms responsive to Dexilant 60 mg daily. Vague intermittent esophageal dysphagia to solids recently. 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   Past Medical History  Diagnosis Date  . Hypertension   . Hyperlipidemia   . Gastroesophageal reflux disease   . Diverticulosis   . Anxiety and depression   . Adenomatous colon polyp 2006    excised in 2006 & 2010Due surveillance 06/2013  . Chronic back pain   . Thyroid nodule   . Fibromyalgia   . Migraines   . Insomnia   . Chronic constipation   . Asthma   . History of benign esophageal tumor 2010    granular cell esophageal tumor (Dx 06/2008), resected via EMR 2011, due repeat EGD 02/2012  . Chest discomfort   . Hiatal hernia   . Depression   . Anxiety   . Sleep apnea     Stop Bang score of 5. Pt said she was told by Dr. Doonquah that she had "a little bit" of sleep apena, but not bad enough to treat.  . Breast tumor     Past Surgical History  Procedure Laterality Date  . Tonsillectomy    . Abdominal hysterectomy    . Total knee arthroplasty  2002    Right; previous arthroscopic surgery  . Shoulder arthroscopy      Right; bone spurs removed  . Lung biopsy      negative  . 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 Right oophorectomy      benign disease  . Colonoscopy  06/2008, 06/2011    sigmoid tics, tubular adenoma; 2013: anal canal hemorrhoids  . Eus  08/2010    Dr Conway-[facility]  with EGD. Retained food. No recurrent esophageal lesion, bx negative.  . Colonoscopy  07/10/2011    Anal canal hemorrhoids likely the cause of hematochezia in the setting of constipation; otherwise normal rectum ;submucosal  petechiae in left colon of doubtful clinical significance; otherwise, normal colon  . Esophagogastroduodenoscopy  12/11/2011    RMR:Noncritical Schatzki's ring; otherwise normal/Small hiatal hernia. Antral and bulbar erosions  . Bravo ph study  12/11/2011    Procedure: BRAVO PH STUDY;  Surgeon: Galilea Quito M Kizzy Olafson, MD;  Location: [facility];  Service: Endoscopy;  Laterality: N/A;  . Cholecystectomy N/A 04/09/2013    Procedure: LAPAROSCOPIC CHOLECYSTECTOMY;  Surgeon: Mark A Jenkins, MD;  Location: [facility];  Service: General;  Laterality: N/A;    Prior to Admission medications   Medication Sig Start Date End Date Taking? Authorizing Provider  albuterol (PROVENTIL HFA;VENTOLIN HFA) 108 (90 BASE) MCG/ACT inhaler Inhale 2 puffs into the lungs every 6 (six) hours as needed. For shortness of breath   Yes Historical Provider, MD  alendronate (FOSAMAX) 70 MG tablet 70 mg once a week. 02/24/14  Yes Historical Provider, MD  amLODipine (NORVASC) 5 MG tablet Take 10 mg by mouth daily.  01/21/13  Yes Anilah Huck M Rothbart, MD  atenolol (TENORMIN) 50 MG tablet Take 1 tablet (50 mg total) by   mouth 2 (two) times daily. 04/13/15  Yes Jonathan F Branch, MD  clonazePAM (KLONOPIN) 1 MG tablet Take 1 tablet (1 mg total) by mouth 2 (two) times daily. 03/01/15 02/29/16 Yes Deborah R Ross, MD  dexlansoprazole (DEXILANT) 60 MG capsule Take 1 capsule (60 mg total) by mouth daily. Patient taking differently: Take 60 mg by mouth 2 (two) times daily.  04/12/14  Yes Leslie S Lewis, PA-C  diclofenac (CATAFLAM) 50 MG tablet Take 1 tablet (50 mg total) by mouth 2 (two) times daily. 12/15/14  Yes Stanley E Harrison, MD  furosemide (LASIX) 40 MG tablet May take daily AS NEEDED for swelling 08/30/14  Yes Jonathan F Branch, MD    gabapentin (NEURONTIN) 300 MG capsule Take 1 capsule (300 mg total) by mouth 3 (three) times daily. 03/01/15  Yes Deborah R Ross, MD  lisinopril-hydrochlorothiazide (PRINZIDE,ZESTORETIC) 20-25 MG per tablet Take 1 tablet by mouth daily. 10/28/14  Yes Kathryn M Lawrence, NP  lovastatin (MEVACOR) 40 MG tablet TAKE ONE TABLET BY MOUTH EVERY NIGHT AT BEDTIME 12/07/14  Yes Kathryn M Lawrence, NP  Olopatadine HCl 0.2 % SOLN Apply to eye.   Yes Historical Provider, MD  potassium chloride SA (K-DUR,KLOR-CON) 20 MEQ tablet TAKE ONE (1) TABLET EACH DAY 01/24/15  Yes Kathryn M Lawrence, NP  Vilazodone HCl (VIIBRYD) 40 MG TABS Take 1 tablet (40 mg total) by mouth every morning. 03/01/15  Yes Deborah R Ross, MD  Vitamin D, Ergocalciferol, (DRISDOL) 50000 UNITS CAPS capsule Take 50,000 Units by mouth every 30 (thirty) days.    Yes Historical Provider, MD  zolpidem (AMBIEN) 10 MG tablet Take 1 tablet (10 mg total) by mouth at bedtime. 03/01/15  Yes Deborah R Ross, MD  lubiprostone (AMITIZA) 8 MCG capsule Take 1 capsule (8 mcg total) by mouth 2 (two) times daily with a meal. Patient not taking: Reported on 04/25/2015 04/12/14   Leslie S Lewis, PA-C  magnesium oxide (MAG-OX) 400 MG tablet Take 400 mg by mouth daily.    Historical Provider, MD  nystatin ointment (MYCOSTATIN) Apply 1 application topically 2 (two) times daily.    Historical Provider, MD  potassium chloride SA (K-DUR,KLOR-CON) 20 MEQ tablet Take 20 mEq by mouth once.     Historical Provider, MD  promethazine (PHENERGAN) 25 MG tablet Take 25 mg by mouth every 6 (six) hours as needed for nausea or vomiting.    Historical Provider, MD  Vilazodone HCl (VIIBRYD) 20 MG TABS Take 1 tablet (20 mg total) by mouth daily. Patient not taking: Reported on 04/25/2015 03/01/15   Deborah R Ross, MD    Allergies as of 04/25/2015 - Review Complete 04/25/2015  Allergen Reaction Noted  . Elavil [amitriptyline] Other (See Comments) 06/03/2012  . Abilify [aripiprazole] Other (See  Comments) 11/25/2012  . Trazodone and nefazodone Other (See Comments) 08/31/2012  . Codeine Hives, Nausea Only, and Other (See Comments)   . Latex Hives 11/21/2011  . Penicillin v Itching 08/02/2013  . Penicillins Hives   . Polyethylene glycol Other (See Comments) 12/26/2012  . Sulfonamide derivatives Nausea And Vomiting   . Cymbalta [duloxetine hcl] Rash 03/10/2013  . Remeron [mirtazapine] Other (See Comments) 05/06/2012    Family History  Problem Relation Age of Onset  . Stroke Father   . Alcohol abuse Father   . Heart attack Mother   . Depression Mother   . Anxiety disorder Mother   . Colon cancer Paternal Aunt   . Colon cancer Paternal Uncle   . Dementia Maternal Uncle   .   ADD / ADHD Neg Hx   . Bipolar disorder Neg Hx   . Drug abuse Neg Hx   . OCD Neg Hx   . Paranoid behavior Neg Hx   . Schizophrenia Neg Hx   . Seizures Neg Hx   . Sexual abuse Neg Hx   . Physical abuse Neg Hx     Social History   Social History  . Marital Status: Single    Spouse Name: N/A  . Number of Children: 1  . Years of Education: N/A   Occupational History  . disabled    Social History Main Topics  . Smoking status: Never Smoker   . Smokeless tobacco: Never Used  . Alcohol Use: No  . Drug Use: No  . Sexual Activity: No   Other Topics Concern  . Not on file   Social History Narrative    Review of Systems: See HPI, otherwise negative ROS  Physical Exam: BP 117/70 mmHg  Pulse 90  Temp(Src) 97.6 F (36.4 C) (Oral)  Ht 5' 2" (1.575 m)  Wt 209 lb 9.6 oz (95.074 kg)  BMI 38.33 kg/m2 General:    pleasant and cooperative in NAD Neck:  Supple; no masses or thyromegaly. No significant cervical adenopathy. Lungs:  Clear throughout to auscultation.   No wheezes, crackles, or rhonchi. No acute distress. Heart:  Regular rate and rhythm; no murmurs, clicks, rubs,  or gallops. Abdomen: Non-distended, normal bowel sounds.  Soft and nontender without appreciable mass or  hepatosplenomegaly.  Pulses:  Normal pulses noted. Extremities:  Without clubbing or edema.  Impression:  69-year-old lady with long-standing GERD now with vague esophageal dysphagia to solids. Distant history of a granular cell tumor of the esophagus status post EMR years ago with negative follow-up EGD in 2013. Reflux symptoms fairly well controlled on Dexilant 60 mg daily. History of chronic constipation now reverted to diarrhea over the past 4 months or so. Multiple dental procedures. Clindamycin administration by history. Amitiza and oral magnesium supplements stopped due to diarrhea.  The possibility of Clostridium difficile infection needs to be considered along with other entities. History of colonic adenoma; due for surveillance colonoscopy 2018 at this point in time.   Recommendations:  Schedule EGD with possible esophageal dilation (history of granular cell tumor and GERD / Dysphagia) Will need deep sedation given psychiatric issues and polypharmacy.  The risks, benefits, limitations, alternatives and imponderables have been reviewed with the patient. Potential for esophageal dilation, biopsy, etc. have also been reviewed.  Questions have been answered. All parties agreeable.  Stool sample for CDiff.  Prescription for Zofran 4 mg tablets (disp #30 - 3 refills)  Take one every 8 hours as needed for nausea    Notice: This dictation was prepared with Dragon dictation along with smaller phrase technology. Any transcriptional errors that result from this process are unintentional and may not be corrected upon review. 

## 2015-05-08 NOTE — Transfer of Care (Signed)
Immediate Anesthesia Transfer of Care Note  Patient: [name]  Procedure(s) Performed: Procedure(s) with comments: ESOPHAGOGASTRODUODENOSCOPY (EGD) WITH PROPOFOL (N/A) - 0730 ESOPHAGEAL DILATION (N/A) [name] 54/56  Patient Location: PACU  Anesthesia Type:MAC  Level of Consciousness: awake  Airway & Oxygen Therapy: Patient Spontanous Breathing and Patient connected to nasal cannula oxygen  Post-op Assessment: Report given to RN  Post vital signs: Reviewed and stable  Last Vitals:  Filed Vitals:   05/08/15 0725  BP: 145/74  Pulse:   Temp:   Resp: 27    Complications: No apparent anesthesia complications

## 2015-05-08 NOTE — Interval H&P Note (Signed)
History and Physical Interval Note:  05/08/2015 7:25 AM  [name]  has presented today for surgery, with the diagnosis of GERD, dysphagia, history of granular cell tumor  The various methods of treatment have been discussed with the patient and family. After consideration of risks, benefits and other options for treatment, the patient has consented to  Procedure(s) with comments: ESOPHAGOGASTRODUODENOSCOPY (EGD) WITH PROPOFOL (N/A) - 0730 POSSIBLE ESOPHAGEAL DILATION (N/A) as a surgical intervention .  The patient's history has been reviewed, patient examined, no change in status, stable for surgery.  I have reviewed the patient's chart and labs.  Questions were answered to the patient's satisfaction.     Jervis Trapani  No change; The risks, benefits, limitations, alternatives and imponderables have been reviewed with the patient. Potential for esophageal dilation, biopsy, etc. have also been reviewed.  Questions have been answered. All parties agreeable.

## 2015-05-08 NOTE — Progress Notes (Signed)
Awake. Denies pain. Swallowing without difficulty. Ginger-ale given to drink. Tolerated well.

## 2015-05-08 NOTE — Anesthesia Postprocedure Evaluation (Signed)
  Anesthesia Post-op Note  Patient: [name]  Procedure(s) Performed: Procedure(s) with comments: ESOPHAGOGASTRODUODENOSCOPY (EGD) WITH PROPOFOL (N/A) - 0730 ESOPHAGEAL DILATION (N/A) [name] 54/56  Patient Location: PACU  Anesthesia Type:MAC  Level of Consciousness: awake  Airway and Oxygen Therapy: Patient Spontanous Breathing  Post-op Pain: none  Post-op Assessment: Post-op Vital signs reviewed, Patient's Cardiovascular Status Stable, Respiratory Function Stable, Patent Airway and No signs of Nausea or vomiting              Post-op Vital Signs: Reviewed and stable  Last Vitals:  Filed Vitals:   05/08/15 0725  BP: 145/74  Pulse:   Temp:   Resp: 27    Complications: No apparent anesthesia complications

## 2015-05-08 NOTE — Op Note (Addendum)
[facility] [address] [place], [address]   2ENDOSCOPY PROCEDURE REPORT  PATIENT: Donna, Houston  MR#: [id number] BIRTHDATE: 11/22/1945 , 64  yrs. old GENDER: female ENDOSCOPIST: R.  Garfield Cornea, MD FACP FACG REFERRED BY:  Conni Slipper, M.D. PROCEDURE DATE:  2015-05-28 PROCEDURE:  EGD, diagnostic and Maloney dilation of esophagus INDICATIONS:  Esophageal dysphagia; Fosamax use; recent diarrhea resolved?"C.  difficile negative.  History of esophageal granular cell tumor MEDICATIONS: Deep sedation per Dr.  [facility] ASA CLASS:      Class II  CONSENT: The risks, benefits, limitations, alternatives and imponderables have been discussed.  The potential for biopsy, esophogeal dilation, etc. have also been reviewed.  Questions have been answered.  All parties agreeable.  Please see the history and physical in the medical record for more information.  DESCRIPTION OF PROCEDURE: After the risks benefits and alternatives of the procedure were thoroughly explained, informed consent was obtained.  The    endoscope was introduced through the mouth and advanced to the second portion of the duodenum , limited by Without limitations. The instrument was slowly withdrawn as the mucosa was fully examined. Estimated blood loss is zero unless otherwise noted in this procedure report.    Single 3 mm distal esophageal erosion straddling a noncritical. Schatzki's ring and somewhat patulous EG junction.  No Barrett's epithelium.  No evidence of residual tumor.  The tubular esophagus appeared patent throughout its course.  Stomach empty.  2 cm hiatal hernia.  Normal-appearing gastric mucosa.  Patent pylorus. Normal-appearing first and second portion of the duodenum.  A 54 French Maloney dilators passed a full insertion easily.  A 56 French Maloney dilator was then passed a full insertion easily.  A look back revealed no apparent complication related to  this maneuver.  Finally, 2 quadrant "biopsy bites" of delivering taken to disrupted.  This was done without difficulty or appear complication.  Retroflexed views revealed as previously described. The scope was then withdrawn from the patient and the procedure completed.  COMPLICATIONS: There were no immediate complications. EBL 5 mL ENDOSCOPIC IMPRESSION: Mild erosive reflux esophagitis. Noncritical. Schatzker's ring?"status post dilation and disruption as described above. Hiatal hernia.  RECOMMENDATIONS: Continue Dexilant 60 mg daily. Add Carafate suspension 4 times a day -   2 weeks.    stop Fosamax and have Dr. Lenard Simmer prescribed a parenteral agent for osteoporosis.  Office visit with [place] in 3 months  REPEAT EXAM:  eSigned:  R. Garfield Cornea, MD Rosalita Chessman [facility] May 28, 2015 8:12 AM    CC:  CPT CODES: ICD CODES:  The ICD and CPT codes recommended by this software are interpretations from the data that the clinical staff has captured with the software.  The verification of the translation of this report to the ICD and CPT codes and modifiers is the sole responsibility of the health care institution and practicing physician where this report was generated.  [employer] will not be held responsible for the validity of the ICD and CPT codes included on this report.  AMA assumes no liability for data contained or not contained herein. CPT is a Designer, television/film set of the [employer].  PATIENT NAME:  Donna, Houston MR#: [id number]

## 2015-05-08 NOTE — Anesthesia Preprocedure Evaluation (Addendum)
Anesthesia Evaluation  Patient identified by MRN, date of birth, ID band Patient awake    Reviewed: Allergy & Precautions, NPO status , Patient's Chart, lab work & pertinent test results, reviewed documented beta blocker date and time   History of Anesthesia Complications (+) PONV  Airway Mallampati: II  TM Distance: >3 FB   Mouth opening: Limited Mouth Opening  Dental  (+) Missing, Edentulous Upper, Poor Dentition, Dental Advisory Given, Partial Lower,    Pulmonary asthma , sleep apnea ,    Pulmonary exam normal        Cardiovascular hypertension, Pt. on medications and Pt. on home beta blockers Normal cardiovascular exam     Neuro/Psych    GI/Hepatic hiatal hernia, GERD  Medicated and Controlled,  Endo/Other    Renal/GU      Musculoskeletal  (+) Arthritis , Osteoarthritis,  Fibromyalgia -, narcotic dependent  Abdominal Normal abdominal exam  (+)   Peds  Hematology   Anesthesia Other Findings   Reproductive/Obstetrics                            Anesthesia Physical Anesthesia Plan  ASA: III  Anesthesia Plan: MAC   Post-op Pain Management:    Induction: Intravenous  Airway Management Planned: Nasal Cannula  Additional Equipment:   Intra-op Plan:   Post-operative Plan:   Informed Consent: I have reviewed the patients History and Physical, chart, labs and discussed the procedure including the risks, benefits and alternatives for the proposed anesthesia with the patient or authorized representative who has indicated his/her understanding and acceptance.   Dental advisory given  Plan Discussed with:   Anesthesia Plan Comments:         Anesthesia Quick Evaluation

## 2015-05-09 ENCOUNTER — Encounter (HOSPITAL_COMMUNITY): Payer: Self-pay | Admitting: Internal Medicine

## 2015-05-27 DIAGNOSIS — R410 Disorientation, unspecified: Secondary | ICD-10-CM | POA: Diagnosis not present

## 2015-05-27 DIAGNOSIS — G9340 Encephalopathy, unspecified: Secondary | ICD-10-CM | POA: Diagnosis not present

## 2015-05-27 DIAGNOSIS — I10 Essential (primary) hypertension: Secondary | ICD-10-CM | POA: Diagnosis not present

## 2015-05-27 DIAGNOSIS — N179 Acute kidney failure, unspecified: Secondary | ICD-10-CM | POA: Diagnosis not present

## 2015-05-27 DIAGNOSIS — N644 Mastodynia: Secondary | ICD-10-CM | POA: Diagnosis not present

## 2015-05-27 DIAGNOSIS — M797 Fibromyalgia: Secondary | ICD-10-CM | POA: Diagnosis not present

## 2015-05-27 DIAGNOSIS — E876 Hypokalemia: Secondary | ICD-10-CM | POA: Diagnosis not present

## 2015-05-27 DIAGNOSIS — R1311 Dysphagia, oral phase: Secondary | ICD-10-CM | POA: Diagnosis not present

## 2015-05-27 DIAGNOSIS — N390 Urinary tract infection, site not specified: Secondary | ICD-10-CM | POA: Diagnosis not present

## 2015-05-29 DIAGNOSIS — R410 Disorientation, unspecified: Secondary | ICD-10-CM | POA: Insufficient documentation

## 2015-06-05 ENCOUNTER — Telehealth (HOSPITAL_COMMUNITY): Payer: Self-pay | Admitting: *Deleted

## 2015-06-05 NOTE — Telephone Encounter (Signed)
Phone call from Alan Ripper, MD. at [facility] regarding [facility].   Please call, he would like to speak with you.

## 2015-06-06 NOTE — Telephone Encounter (Signed)
Pt is inpt in [facility] with altered mental status. Will f/u here at discharge

## 2015-06-06 NOTE — Telephone Encounter (Signed)
lmtcb

## 2015-06-13 DIAGNOSIS — F0390 Unspecified dementia without behavioral disturbance: Secondary | ICD-10-CM | POA: Insufficient documentation

## 2015-06-14 DIAGNOSIS — Z8659 Personal history of other mental and behavioral disorders: Secondary | ICD-10-CM | POA: Insufficient documentation

## 2015-06-15 DIAGNOSIS — E213 Hyperparathyroidism, unspecified: Secondary | ICD-10-CM | POA: Insufficient documentation

## 2015-06-27 ENCOUNTER — Encounter (HOSPITAL_COMMUNITY): Payer: Self-pay | Admitting: *Deleted

## 2015-06-27 ENCOUNTER — Telehealth (HOSPITAL_COMMUNITY): Payer: Self-pay | Admitting: *Deleted

## 2015-06-27 ENCOUNTER — Emergency Department (HOSPITAL_COMMUNITY)
Admission: EM | Admit: 2015-06-27 | Discharge: 2015-06-27 | Disposition: A | Discharge status: Home / Self Care | Payer: PPO | Attending: Emergency Medicine | Admitting: Emergency Medicine

## 2015-06-27 DIAGNOSIS — I10 Essential (primary) hypertension: Secondary | ICD-10-CM | POA: Insufficient documentation

## 2015-06-27 DIAGNOSIS — F329 Major depressive disorder, single episode, unspecified: Secondary | ICD-10-CM | POA: Insufficient documentation

## 2015-06-27 DIAGNOSIS — Z79899 Other long term (current) drug therapy: Secondary | ICD-10-CM | POA: Insufficient documentation

## 2015-06-27 DIAGNOSIS — J45909 Unspecified asthma, uncomplicated: Secondary | ICD-10-CM | POA: Insufficient documentation

## 2015-06-27 DIAGNOSIS — F419 Anxiety disorder, unspecified: Secondary | ICD-10-CM | POA: Diagnosis not present

## 2015-06-27 DIAGNOSIS — Z8603 Personal history of neoplasm of uncertain behavior: Secondary | ICD-10-CM | POA: Diagnosis not present

## 2015-06-27 DIAGNOSIS — M797 Fibromyalgia: Secondary | ICD-10-CM | POA: Insufficient documentation

## 2015-06-27 DIAGNOSIS — R634 Abnormal weight loss: Secondary | ICD-10-CM | POA: Diagnosis not present

## 2015-06-27 DIAGNOSIS — K5900 Constipation, unspecified: Secondary | ICD-10-CM | POA: Insufficient documentation

## 2015-06-27 DIAGNOSIS — G4700 Insomnia, unspecified: Secondary | ICD-10-CM | POA: Diagnosis not present

## 2015-06-27 DIAGNOSIS — E785 Hyperlipidemia, unspecified: Secondary | ICD-10-CM | POA: Diagnosis not present

## 2015-06-27 DIAGNOSIS — G43909 Migraine, unspecified, not intractable, without status migrainosus: Secondary | ICD-10-CM | POA: Insufficient documentation

## 2015-06-27 DIAGNOSIS — Z91040 Latex allergy status: Secondary | ICD-10-CM | POA: Diagnosis not present

## 2015-06-27 DIAGNOSIS — Z880 Allergy status to penicillin: Secondary | ICD-10-CM | POA: Insufficient documentation

## 2015-06-27 DIAGNOSIS — G8929 Other chronic pain: Secondary | ICD-10-CM | POA: Insufficient documentation

## 2015-06-27 DIAGNOSIS — Z791 Long term (current) use of non-steroidal anti-inflammatories (NSAID): Secondary | ICD-10-CM | POA: Insufficient documentation

## 2015-06-27 DIAGNOSIS — K219 Gastro-esophageal reflux disease without esophagitis: Secondary | ICD-10-CM | POA: Insufficient documentation

## 2015-06-27 HISTORY — DX: Neoplasm of unspecified behavior of digestive system: D49.0

## 2015-06-27 MED ORDER — LORAZEPAM 1 MG PO TABS: 0.5000 mg | ORAL_TABLET | Freq: Every day | ORAL | Status: DC

## 2015-06-27 NOTE — ED Provider Notes (Signed)
CSN: [id number]     Arrival date & time 06/27/15  49 History   First MD Initiated Contact with Patient 06/27/15 1903     No chief complaint on file.    (Consider location/radiation/quality/duration/timing/severity/associated sxs/prior Treatment) HPI 70 year old female with a history of depression and anxiety who presents today after a recent hospitalization at [facility]. She states that during that admission she was evaluated for weight loss. She was then transferred to the psychiatric unit and treated for depression with psychotic features. She states that she was discharged from there several days before Christmas. She has been started on trazodone instead of Ativan. She states that the trazodone makes her began to fall asleep and wake up feeling like she can't catch her breath and has difficulty swallowing. She has quit taking the trazodone is not able to sleep. She has an appointment to follow-up with Dr. Harrington Challenger on Friday. She denies any other complaints such as depression, suicidal ideation, homicidal ideation, or psychotic features currently. She states that she has multiple health problems but denies any acute problems such as headache, chest pain, dyspnea, nausea, vomiting, diarrhea, fever, or chills. Past Medical History  Diagnosis Date  . Hypertension   . Hyperlipidemia   . Gastroesophageal reflux disease   . Diverticulosis   . Anxiety and depression   . Adenomatous colon polyp 2006    excised in 2006 & 2010Due surveillance 06/2013  . Chronic back pain   . Thyroid nodule   . Fibromyalgia   . Migraines   . Insomnia   . Chronic constipation   . Asthma   . History of benign esophageal tumor 2010    granular cell esophageal tumor (Dx 06/2008), resected via EMR 2011, due repeat EGD 02/2012  . Chest discomfort   . Hiatal hernia   . Depression   . Anxiety   . Sleep apnea     Stop Bang score of 5. Pt said she was told by Dr. Merlene Laughter that she had "a little bit" of sleep apena, but  not bad enough to treat.  . Breast tumor   . Complication of anesthesia   . PONV (postoperative nausea and vomiting)   . Tumor of esophagus    Past Surgical History  Procedure Laterality Date  . Tonsillectomy    . Abdominal hysterectomy    . Total knee arthroplasty  2002    Right; previous arthroscopic surgery  . Shoulder arthroscopy      Right; bone spurs removed  . Lung biopsy      negative  . 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 Right oophorectomy      benign disease  . Colonoscopy  06/2008, 06/2011    sigmoid tics, tubular adenoma; 2013: anal canal hemorrhoids  . Eus  08/2010    Dr [facility] with EGD. Retained food. No recurrent esophageal lesion, bx negative.  . Colonoscopy  07/10/2011    Anal canal hemorrhoids likely the cause of hematochezia in the setting of constipation; otherwise normal rectum ;submucosal  petechiae in left colon of doubtful clinical significance; otherwise, normal colon  . Esophagogastroduodenoscopy  12/11/2011    [id number] Schatzki's ring; otherwise normal/Small hiatal hernia. Antral and bulbar erosions  . Bravo ph study  12/11/2011    Procedure: BRAVO [place] STUDY;  Surgeon: Daneil Dolin, MD;  Location: [facility] ENDO SUITE;  Service: Endoscopy;  Laterality: N/A;  . Cholecystectomy N/A 04/09/2013    Procedure: LAPAROSCOPIC CHOLECYSTECTOMY;  Surgeon: Elta Guadeloupe  Lowella Petties, MD;  Location: [facility] ORS;  Service: General;  Laterality: N/A;  . Esophagogastroduodenoscopy (egd) with propofol N/A 05/08/2015    Procedure: ESOPHAGOGASTRODUODENOSCOPY (EGD) WITH PROPOFOL;  Surgeon: Daneil Dolin, MD;  Location: [facility] ORS;  Service: Endoscopy;  Laterality: N/A;  0730  . Esophageal dilation N/A 05/08/2015    Procedure: ESOPHAGEAL DILATION;  Surgeon: Daneil Dolin, MD;  Location: [facility] ORS;  Service: Endoscopy;  Laterality: N/ANathaniel Man   Family History  Problem Relation Age of Onset  . Stroke Father   . Alcohol abuse Father   .  Heart attack Mother   . Depression Mother   . Anxiety disorder Mother   . Colon cancer Paternal Aunt   . Colon cancer Paternal Uncle   . Dementia Maternal Uncle   . ADD / ADHD Neg Hx   . Bipolar disorder Neg Hx   . Drug abuse Neg Hx   . OCD Neg Hx   . Paranoid behavior Neg Hx   . Schizophrenia Neg Hx   . Seizures Neg Hx   . Sexual abuse Neg Hx   . Physical abuse Neg Hx    Social History  Substance Use Topics  . Smoking status: Never Smoker   . Smokeless tobacco: Never Used  . Alcohol Use: No   OB History    No data available     Review of Systems  All other systems reviewed and are negative.     Allergies  Elavil; Abilify; Trazodone and nefazodone; Codeine; Latex; Penicillin v; Penicillins; Polyethylene glycol; Sulfonamide derivatives; Cymbalta; and Remeron  Home Medications   Prior to Admission medications   Medication Sig Start Date End Date Taking? Authorizing Provider  albuterol (PROVENTIL HFA;VENTOLIN HFA) 108 (90 BASE) MCG/ACT inhaler Inhale 2 puffs into the lungs every 6 (six) hours as needed. For shortness of breath    Historical Provider, MD  alendronate (FOSAMAX) 70 MG tablet Take 70 mg by mouth once a week.  02/24/14   Historical Provider, MD  AMITIZA 8 MCG capsule TAKE ONE CAPSULE BY MOUTH TWO TIMES DAILY WITH A MEAL 05/03/15   Orvil Feil, NP  amLODipine (NORVASC) 5 MG tablet Take 5 mg by mouth daily.  01/21/13   Yehuda Savannah, MD  atenolol (TENORMIN) 50 MG tablet Take 1 tablet (50 mg total) by mouth 2 (two) times daily. 04/13/15   Arnoldo Lenis, MD  clonazePAM (KLONOPIN) 1 MG tablet Take 1 tablet (1 mg total) by mouth 2 (two) times daily. 03/01/15 02/29/16  Cloria Spring, MD  DEXILANT 60 MG capsule TAKE ONE CAPSULE BY MOUTH DAILY 05/01/15   Orvil Feil, NP  diclofenac (CATAFLAM) 50 MG tablet Take 1 tablet (50 mg total) by mouth 2 (two) times daily. 12/15/14   Carole Civil, MD  furosemide (LASIX) 40 MG tablet May take daily AS NEEDED for  swelling Patient taking differently: Take 40 mg by mouth daily as needed for fluid or edema.  08/30/14   Arnoldo Lenis, MD  gabapentin (NEURONTIN) 300 MG capsule Take 1 capsule (300 mg total) by mouth 3 (three) times daily. 03/01/15   Cloria Spring, MD  lisinopril-hydrochlorothiazide (PRINZIDE,ZESTORETIC) 20-25 MG per tablet Take 1 tablet by mouth daily. 10/28/14   Lendon Colonel, NP  lovastatin (MEVACOR) 40 MG tablet TAKE ONE TABLET BY MOUTH EVERY NIGHT AT BEDTIME 12/07/14   Lendon Colonel, NP  magnesium oxide (MAG-OX) 400 MG tablet Take 400 mg by mouth daily.  Historical Provider, MD  nystatin ointment (MYCOSTATIN) Apply 1 application topically 2 (two) times daily.    Historical Provider, MD  Olopatadine HCl 0.2 % SOLN Apply 1 drop to eye 2 (two) times daily.     Historical Provider, MD  ondansetron (ZOFRAN) 4 MG tablet Take 1 tablet (4 mg total) by mouth every 8 (eight) hours as needed for nausea or vomiting. 04/25/15   Daneil Dolin, MD  potassium chloride SA (K-DUR,KLOR-CON) 20 MEQ tablet TAKE ONE (1) TABLET EACH DAY 01/24/15   Lendon Colonel, NP  promethazine (PHENERGAN) 25 MG tablet Take 25 mg by mouth every 6 (six) hours as needed for nausea or vomiting.    Historical Provider, MD  Vilazodone HCl (VIIBRYD) 20 MG TABS Take 1 tablet (20 mg total) by mouth daily. Patient taking differently: Take 20 mg by mouth daily. Taking with 40mg  03/01/15   Cloria Spring, MD  Vilazodone HCl (VIIBRYD) 40 MG TABS Take 1 tablet (40 mg total) by mouth every morning. Patient taking differently: Take 40 mg by mouth daily. Taking with 20mg  03/01/15   Cloria Spring, MD  Vitamin D, Ergocalciferol, (DRISDOL) 50000 UNITS CAPS capsule Take 50,000 Units by mouth every 30 (thirty) days.     Historical Provider, MD  zolpidem (AMBIEN) 10 MG tablet Take 1 tablet (10 mg total) by mouth at bedtime. 03/01/15   Cloria Spring, MD   BP 151/66 mmHg  Pulse 65  Temp(Src) 98.8 F (37.1 C) (Oral)  Resp 18  Ht 5\' 2"  (1.575  m)  Wt 89.869 kg  BMI 36.23 kg/m2  SpO2 100% Physical Exam  Constitutional: She is oriented to person, place, and time. She appears well-developed and well-nourished.  HENT:  Head: Normocephalic and atraumatic.  Right Ear: External ear normal.  Left Ear: External ear normal.  Nose: Nose normal.  Mouth/Throat: Oropharynx is clear and moist.  Eyes: Conjunctivae and EOM are normal. Pupils are equal, round, and reactive to light.  Neck: Normal range of motion. Neck supple. No JVD present. No tracheal deviation present. No thyromegaly present.  Cardiovascular: Normal rate, regular rhythm, normal heart sounds and intact distal pulses.   Pulmonary/Chest: Effort normal and breath sounds normal. She has no wheezes.  Abdominal: Soft. Bowel sounds are normal. She exhibits no mass. There is no tenderness. There is no guarding.  Musculoskeletal: Normal range of motion.  Lymphadenopathy:    She has no cervical adenopathy.  Neurological: She is alert and oriented to person, place, and time. She has normal reflexes. No cranial nerve deficit or sensory deficit. Gait normal. GCS eye subscore is 4. GCS verbal subscore is 5. GCS motor subscore is 6.  Reflex Scores:      Bicep reflexes are 2+ on the right side and 2+ on the left side.      Patellar reflexes are 2+ on the right side and 2+ on the left side. Strength is normal and equal throughout. Cranial nerves grossly intact. Patient fluent. No gross ataxia and patient able to ambulate without difficulty.  Skin: Skin is warm and dry.  Psychiatric: Her speech is normal and behavior is normal. Judgment and thought content normal. Her affect is blunt. Cognition and memory are impaired.  Nursing note and vitals reviewed.   ED Course  Procedures (including critical care time) Labs Review Labs Reviewed - No data to display  Imaging Review No results found. I have personally reviewed and evaluated these images and lab results as part of my medical  decision-making.   EKG Interpretation None      MDM   Final diagnoses:  Insomnia    I reviewed patient's medications. She is currently on trazodone and I did not note any antidepressants. Patient's daughter is in the room. She is given a prescription for Ativan 0.5 daily at bedtime for the next 3 nights. She is to be seen in follow-up by psychiatry on Friday. We have discussed return precautions and need for her to keep this appointment.    Pattricia Boss, MD 06/29/15 [phone]

## 2015-06-27 NOTE — Telephone Encounter (Signed)
She is coming in Friday, please ask octavia to call her.I will need her records from [place] before the visit

## 2015-06-27 NOTE — Telephone Encounter (Signed)
The Trazodone that [facility] gave her she can't take, it makes her itch, and she told them this.    She can't sleep.  She was at [facility] for a month, she can't remember anything that happened to her before she went or what  happened while she was there.

## 2015-06-27 NOTE — ED Notes (Signed)
Recent admission at [facility] and had a "breakdown" while there, pt with anxiety attacks and unable to sleep.  Pt denies SI or HI, pt states unable to relax and feel restlessness

## 2015-06-27 NOTE — Discharge Instructions (Signed)
Insomnia Insomnia is a sleep disorder that makes it difficult to fall asleep or to stay asleep. Insomnia can cause tiredness (fatigue), low energy, difficulty concentrating, mood swings, and poor performance at work or school.  There are three different ways to classify insomnia:  Difficulty falling asleep.  Difficulty staying asleep.  Waking up too early in the morning. Any type of insomnia can be long-term (chronic) or short-term (acute). Both are common. Short-term insomnia usually lasts for three months or less. Chronic insomnia occurs at least three times a week for longer than three months. CAUSES  Insomnia may be caused by another condition, situation, or substance, such as:  Anxiety.  Certain medicines.  Gastroesophageal reflux disease (GERD) or other gastrointestinal conditions.  Asthma or other breathing conditions.  Restless legs syndrome, sleep apnea, or other sleep disorders.  Chronic pain.  Menopause. This may include hot flashes.  Stroke.  Abuse of alcohol, tobacco, or illegal drugs.  Depression.  Caffeine.   Neurological disorders, such as Alzheimer disease.  An overactive thyroid (hyperthyroidism). The cause of insomnia may not be known. RISK FACTORS Risk factors for insomnia include:  Gender. Women are more commonly affected than men.  Age. Insomnia is more common as you get older.  Stress. This may involve your professional or personal life.  Income. Insomnia is more common in people with lower income.  Lack of exercise.   Irregular work schedule or night shifts.  Traveling between different time zones. SIGNS AND SYMPTOMS If you have insomnia, trouble falling asleep or trouble staying asleep is the main symptom. This may lead to other symptoms, such as:  Feeling fatigued.  Feeling nervous about going to sleep.  Not feeling rested in the morning.  Having trouble concentrating.  Feeling irritable, anxious, or depressed. TREATMENT   Treatment for insomnia depends on the cause. If your insomnia is caused by an underlying condition, treatment will focus on addressing the condition. Treatment may also include:   Medicines to help you sleep.  Counseling or therapy.  Lifestyle adjustments. HOME CARE INSTRUCTIONS   Take medicines only as directed by your health care provider.  Keep regular sleeping and waking hours. Avoid naps.  Keep a sleep diary to help you and your health care provider figure out what could be causing your insomnia. Include:   When you sleep.  When you wake up during the night.  How well you sleep.   How rested you feel the next day.  Any side effects of medicines you are taking.  What you eat and drink.   Make your bedroom a comfortable place where it is easy to fall asleep:  Put up shades or special blackout curtains to block light from outside.  Use a white noise machine to block noise.  Keep the temperature cool.   Exercise regularly as directed by your health care provider. Avoid exercising right before bedtime.  Use relaxation techniques to manage stress. Ask your health care provider to suggest some techniques that may work well for you. These may include:  Breathing exercises.  Routines to release muscle tension.  Visualizing peaceful scenes.  Cut back on alcohol, caffeinated beverages, and cigarettes, especially close to bedtime. These can disrupt your sleep.  Do not overeat or eat spicy foods right before bedtime. This can lead to digestive discomfort that can make it hard for you to sleep.  Limit screen use before bedtime. This includes:  Watching TV.  Using your smartphone, tablet, and computer.  Stick to a routine. This   can help you fall asleep faster. Try to do a quiet activity, brush your teeth, and go to bed at the same time each night.  Get out of bed if you are still awake after 15 minutes of trying to sleep. Keep the lights down, but try reading or  doing a quiet activity. When you feel sleepy, go back to bed.  Make sure that you drive carefully. Avoid driving if you feel very sleepy.  Keep all follow-up appointments as directed by your health care provider. This is important. SEEK MEDICAL CARE IF:   You are tired throughout the day or have trouble in your daily routine due to sleepiness.  You continue to have sleep problems or your sleep problems get worse. SEEK IMMEDIATE MEDICAL CARE IF:   You have serious thoughts about hurting yourself or someone else.   This information is not intended to replace advice given to you by your health care provider. Make sure you discuss any questions you have with your health care provider.   Document Released: 06/07/2000 Document Revised: 03/01/2015 Document Reviewed: 03/11/2014 Elsevier Interactive Patient Education 2016 [employer]  

## 2015-06-28 NOTE — Telephone Encounter (Signed)
Called pt and informed her that provider would [facility] for her to get her records from [place]. Per pt she went to the hospital last night at [facility] and we should be able to see those records. Informed pt that we are connected to [facility] but the records that we need is from [place] and we are not connected with them. Per pt, she will call her sister and have her call [facility] for her to have [facility] send [facility] records to office. Informed pt that provider would need records by Friday when she gets here and pt agreed and showed understanding

## 2015-06-30 ENCOUNTER — Encounter (HOSPITAL_COMMUNITY): Payer: Self-pay | Admitting: Psychiatry

## 2015-06-30 ENCOUNTER — Ambulatory Visit (INDEPENDENT_AMBULATORY_CARE_PROVIDER_SITE_OTHER): Payer: PPO | Admitting: Psychiatry

## 2015-06-30 VITALS — BP 131/72 | HR 64 | Ht 62.0 in | Wt 198.0 lb

## 2015-06-30 DIAGNOSIS — F330 Major depressive disorder, recurrent, mild: Secondary | ICD-10-CM

## 2015-06-30 MED ORDER — CLONAZEPAM 1 MG PO TABS: 1.0000 mg | ORAL_TABLET | Freq: Three times a day (TID) | ORAL | Status: DC

## 2015-06-30 MED ORDER — VILAZODONE HCL 20 MG PO TABS: 20.0000 mg | ORAL_TABLET | Freq: Every day | ORAL | Status: DC

## 2015-06-30 MED ORDER — QUETIAPINE FUMARATE 100 MG PO TABS: 100.0000 mg | ORAL_TABLET | Freq: Every day | ORAL | Status: DC

## 2015-06-30 NOTE — Progress Notes (Signed)
[name], female   DOB: May 29, 1946, 70 y.o.   MRN: [id number] [name], female   DOB: 09-25-1945, 70 y.o.   MRN: [id number] [name], female   DOB: 01/19/1946, 70 y.o.   MRN: [id number] [name], female   DOB: 03-12-1946, 70 y.o.   MRN: [id number] [name], female   DOB: 04/15/1946, 69 y.o.   MRN: [id number] [name], female   DOB: Jun 29, 1945, 70 y.o.   MRN: [id number] [name], female   DOB: December 19, 1945, 70 y.o.   MRN: [id number] [name], female   DOB: [birth year], 70 y.o.   MRN: [id number] [name], female   DOB: 09/15/1945, 70 y.o.   MRN: [id number] [name], female   DOB: [birth year], 70 y.o.   MRN: [id number] [name], female   DOB: [birth year], 70 y.o.   MRN: [id number] [name], female   DOB: [birth year], 70 y.o.   MRN: [id number] [facility] [id number] Progress Note [name] MRN: [id number] DOB: 02-Mar-1946 Age: 70 y.o.  Date: 06/30/2015 Start Time: 9:45 AM End Time: 10:10 AM  Chief Complaint: Chief Complaint  Patient presents with  . Depression  . Paranoid  . Anxiety  . Follow-up    Subjective: "I' just got out of the hospital  This patient is a 70 year old black female who lives with her 22 year old daughter who has mild mental retardation. She lives in [place]. She is a retired [employer] manager. The patient states that she's been depressed "all my life.". She can't give any specific reasons for the depression but notes that at age 86 she was already taking medication to help with sleep. In her 25s her family doctor diagnosed with schizophrenia although she had no symptoms of paranoia or delusions or hallucinations. She was given medication for this.  Since 1997 she's been coming here with symptoms of depression. She's also had significant problems with fibromyalgia and  fatigue. In Neurontin and Ambien have helped. Lately however she's become more depressed and worried. She has had gallbladder surgery and she's concerned about how is going to go. For some reason she's been thinking a lot about death. She doesn't have anyone to stay with her daughter something goes wrong. She's been more negative but is  not suicidal but her energy has dropped. In general she is a very functional person  The patient returns after 4 months. She is accompanied by her sister. She was in [facility] hospital most of last month. Her sister lives in [place] and brought her there after she noticed the patient wasn't making sense see more paranoid and confused and couldn't take care of her basic needs. She stayed in the medical part of the hospital for about 3 weeks and then was transferred to psychiatry. She had a very thorough neurological workup including brain CT MRI and lumbar puncture all of which were noncontributory. It was thought she had a UTI which was treated. She was taken off gabapentin and Ambien. She was tapered off Ativan and went home on no benzodiazepines except enough for 3 days. All she was in the hospital she was treated with Haldol but this was tapered off as well and she is only been discharged on Viibryd and trazodone to which she is allergic.  Since getting back on 12/28 she has had a difficult time.  She is extremely anxious and cannot stop pacing. She is no longer as confused or paranoid and she denies auditory or visual hallucinations. She has not been suicidal or significantly depressed. She's not been able to sleep and went to the ER the other night and received Ativan 0.5 mg which is only helped a little bit. She stays with her daughter who it does have some limitations but she is helpful. The patient states she slept the best on Ambien but I don't think this is safe because she gets up in the night and eats and does other things that she doesn't remember the next  day   Vitals: BP 131/72 mmHg  Pulse 64  Ht '5\' 2"'  (1.575 m)  Wt 198 lb (89.812 kg)  BMI 36.21 kg/m2  SpO2 99%  Psychiatric history Patient has a long history of depression.  She has been seeing in this office since May 1997.  Patient denies any history of suicidal attempt however endorse chronic depression and anxiety.  In the past she has taken Prozac but did not like after taking few doses, she also has taken Seroquel, Abilify, Xanax, Pamelor, and amitriptyline.  Patient denies a history of paranoia or delusions however there are times when she has been very depressed and have a lot of negative symptoms.  Alcohol and substance use history Patient denies any history of alcohol or substance use.  Allergies: Allergies  Allergen Reactions  . Elavil [Amitriptyline] Other (See Comments)    Felt really nervous and felt like something was hold her feet or arm and/ or AM headache on it and back on it and went away off it.   . Abilify [Aripiprazole] Other (See Comments)    Dystonic reaction  . Trazodone And Nefazodone Other (See Comments)    Brought back bad dreams of things in the past  . Codeine Hives, Nausea Only and Other (See Comments)    Feels funny  . Latex Hives  . Penicillin V Itching  . Penicillins Hives    Has patient had a PCN reaction causing immediate rash, facial/tongue/throat swelling, SOB or lightheadedness with hypotension: Yes Has patient had a PCN reaction causing severe rash involving mucus membranes or skin necrosis: No Has patient had a PCN reaction that required hospitalization No Has patient had a PCN reaction occurring within the last 10 years: No If all of the above answers are "NO", then may proceed with Cephalosporin use.   . Polyethylene Glycol Other (See Comments)    Per allergy test  . Sulfonamide Derivatives Nausea And Vomiting  . Cymbalta [Duloxetine Hcl] Rash  . Remeron [Mirtazapine] Other (See Comments)    Caused lots of strange, weird, crazy  dreams.   Medical History: Past Medical History  Diagnosis Date  . Hypertension   . Hyperlipidemia   . Gastroesophageal reflux disease   . Diverticulosis   . Anxiety and depression   . Adenomatous colon polyp 2006    excised in 2006 & 2010Due surveillance 06/2013  . Chronic back pain   . Thyroid nodule   . Fibromyalgia   . Migraines   . Insomnia   . Chronic constipation   . Asthma   . History of benign esophageal tumor 2010    granular cell esophageal tumor (Dx 06/2008), resected via EMR 2011, due repeat EGD 02/2012  . Chest discomfort   . Hiatal hernia   . Depression   . Anxiety   . Sleep apnea     Stop Bang score of 5.  Pt said she was told by Dr. Merlene Laughter that she had "a little bit" of sleep apena, but not bad enough to treat.  . Breast tumor   . Complication of anesthesia   . PONV (postoperative nausea and vomiting)   . Tumor of esophagus   . Psychosis   Patient has history of hypertension, hyperlipidemia, GERD, diverticulosis, colonic polyp, chronic back pain, fibromyalgia, chronic constipation, benign tumor of esophagus and chronic headache.  Her primary care physician is Dr. Legrand Rams, she sees Dr. Paulita Cradle for chronic pain, fibromyalgia and headache. Her cardiologist is Dr Elvera Maria. Surgical History: Past Surgical History  Procedure Laterality Date  . Tonsillectomy    . Abdominal hysterectomy    . Total knee arthroplasty  2002    Right; previous arthroscopic surgery  . Shoulder arthroscopy      Right; bone spurs removed  . Lung biopsy      negative  . 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 Right oophorectomy      benign disease  . Colonoscopy  06/2008, 06/2011    sigmoid tics, tubular adenoma; 2013: anal canal hemorrhoids  . Eus  08/2010    Dr [facility] with EGD. Retained food. No recurrent esophageal lesion, bx negative.  . Colonoscopy  07/10/2011    Anal canal hemorrhoids likely the cause of hematochezia in the setting  of constipation; otherwise normal rectum ;submucosal  petechiae in left colon of doubtful clinical significance; otherwise, normal colon  . Esophagogastroduodenoscopy  12/11/2011    YJE:[id number] Schatzki's ring; otherwise normal/Small hiatal hernia. Antral and bulbar erosions  . Bravo ph study  12/11/2011    Procedure: BRAVO [place] STUDY;  Surgeon: Daneil Dolin, MD;  Location: [facility] END[facility] SUITE;  Service: Endoscopy;  Laterality: N/A;  . Cholecystectomy N/A 04/09/2013    Procedure: LAPAROSCOPIC CHOLECYSTECTOMY;  Surgeon: Jamesetta So, MD;  Location: [facility] ORS;  Service: General;  Laterality: N/A;  . Esophagogastroduodenoscopy (egd) with propofol N/A 05/08/2015    Procedure: ESOPHAGOGASTRODUODENOSCOPY (EGD) WITH PROPOFOL;  Surgeon: Daneil Dolin, MD;  Location: [facility] ORS;  Service: Endoscopy;  Laterality: N/A;  0730  . Esophageal dilation N/A 05/08/2015    Procedure: ESOPHAGEAL DILATION;  Surgeon: Daneil Dolin, MD;  Location: [facility] ORS;  Service: Endoscopy;  Laterality: N/A;  [name] 54/56   Family History: family history includes Alcohol abuse in her father; Anxiety disorder in her mother; Colon cancer in her paternal aunt and paternal uncle; Dementia in her maternal uncle; Depression in her mother; Heart attack in her mother; Stroke in her father. There is no history of ADD / ADHD, Bipolar disorder, Drug abuse, OCD, Paranoid behavior, Schizophrenia, Seizures, Sexual abuse, or Physical abuse. Reviewed and nothing new today again.  Mental status examination Patient is casually dressed and fairly groomed. She is calm cooperative and maintained fair eye contact. She described her mood as anxious and tired and her affect is congruent She still has chronic pain  Her speech is coherent but slow.  Her thought processes clear and logical. She denies suicidal ideation and no active or passive homicidal thoughts however she seems a bit illogical, claiming she cannot cut her Viibryd 40 mg in half to make 20 because it  would "confuse me.'as an example. She denies any auditory or visual hallucination. There no psychotic symptoms present at this time. She's alert and oriented x3. Her insight judgment impulse control is okay her fund of knowledge is fair and her language skills are  good  Lab Results:  Results for orders placed or performed during the hospital encounter of 05/01/15 (from the past 8736 hour(s))  CBC with Differential/Platelet   Collection Time: 05/01/15  2:10 PM  Result Value Ref Range   WBC 4.6 4.0 - 10.5 K/uL   RBC 4.71 3.87 - 5.11 MIL/uL   Hemoglobin 12.5 12.0 - 15.0 g/dL   HCT 39.5 36.0 - 46.0 %   MCV 83.9 78.0 - 100.0 fL   MCH 26.5 26.0 - 34.0 pg   MCHC 31.6 30.0 - 36.0 g/dL   RDW 15.0 11.5 - 15.5 %   Platelets 188 150 - 400 K/uL   Neutrophils Relative % 53 %   Neutro Abs 2.4 1.7 - 7.7 K/uL   Lymphocytes Relative 39 %   Lymphs Abs 1.8 0.7 - 4.0 K/uL   Monocytes Relative 6 %   Monocytes Absolute 0.3 0.1 - 1.0 K/uL   Eosinophils Relative 2 %   Eosinophils Absolute 0.1 0.0 - 0.7 K/uL   Basophils Relative 0 %   Basophils Absolute 0.0 0.0 - 0.1 K/uL  Basic metabolic panel   Collection Time: 05/01/15  2:10 PM  Result Value Ref Range   Sodium 141 135 - 145 mmol/L   Potassium 3.9 3.5 - 5.1 mmol/L   Chloride 107 101 - 111 mmol/L   CO2 26 22 - 32 mmol/L   Glucose, Bld 95 65 - 99 mg/dL   BUN 10 6 - 20 mg/dL   Creatinine, Ser 1.13 (H) 0.44 - 1.00 mg/dL   Calcium 10.2 8.9 - 10.3 mg/dL   GFR calc non Af Amer 48 (L) >60 mL/min   GFR calc Af Amer 56 (L) >60 mL/min   Anion gap 8 5 - 15  Results for orders placed or performed in visit on 04/25/15 (from the past 8736 hour(s))  Clostridium Difficile by PCR   Collection Time: 05/03/15 11:17 AM  Result Value Ref Range   Toxigenic C Difficile by pcr Not Detected Not Detected  Results for orders placed or performed in visit on 09/29/14 (from the past 8736 hour(s))  Basic Metabolic Panel (BMET)   Collection Time: 10/29/14 10:32 AM  Result  Value Ref Range   Sodium 141 135 - 145 mEq/L   Potassium 3.6 3.5 - 5.3 mEq/L   Chloride 104 96 - 112 mEq/L   CO2 28 19 - 32 mEq/L   Glucose, Bld 95 70 - 99 mg/dL   BUN 17 6 - 23 mg/dL   Creat 1.10 0.50 - 1.10 mg/dL   Calcium 10.2 8.4 - 10.5 mg/dL  Magnesium   Collection Time: 10/29/14 10:32 AM  Result Value Ref Range   Magnesium 2.1 1.5 - 2.5 mg/dL  Results for orders placed or performed in visit on 09/27/14 (from the past 8736 hour(s))  Lipid Profile   Collection Time: 09/27/14 11:29 AM  Result Value Ref Range   Cholesterol 173 0 - 200 mg/dL   Triglycerides 88 <150 mg/dL   HDL 61 >=46 mg/dL   Total CHOL/HDL Ratio 2.8 Ratio   VLDL 18 0 - 40 mg/dL   LDL Cholesterol 94 0 - 99 mg/dL  Basic Metabolic Panel (BMET)   Collection Time: 09/27/14 11:29 AM  Result Value Ref Range   Sodium 141 135 - 145 mEq/L   Potassium 3.8 3.5 - 5.3 mEq/L   Chloride 101 96 - 112 mEq/L   CO2 29 19 - 32 mEq/L   Glucose, Bld 94 70 - 99 mg/dL  BUN 15 6 - 23 mg/dL   Creat 1.05 0.50 - 1.10 mg/dL   Calcium 9.8 8.4 - 10.5 mg/dL  CBC with Differential   Collection Time: 09/27/14 11:29 AM  Result Value Ref Range   WBC 5.8 4.0 - 10.5 K/uL   RBC 4.92 3.87 - 5.11 MIL/uL   Hemoglobin 12.7 12.0 - 15.0 g/dL   HCT 40.0 36.0 - 46.0 %   MCV 81.3 78.0 - 100.0 fL   MCH 25.8 (L) 26.0 - 34.0 pg   MCHC 31.8 30.0 - 36.0 g/dL   RDW 15.8 (H) 11.5 - 15.5 %   Platelets 207 150 - 400 K/uL   MPV 9.5 8.6 - 12.4 fL   Neutrophils Relative % 55 43 - 77 %   Neutro Abs 3.2 1.7 - 7.7 K/uL   Lymphocytes Relative 34 12 - 46 %   Lymphs Abs 2.0 0.7 - 4.0 K/uL   Monocytes Relative 8 3 - 12 %   Monocytes Absolute 0.5 0.1 - 1.0 K/uL   Eosinophils Relative 2 0 - 5 %   Eosinophils Absolute 0.1 0.0 - 0.7 K/uL   Basophils Relative 1 0 - 1 %   Basophils Absolute 0.1 0.0 - 0.1 K/uL   Smear Review Criteria for review not met   Magnesium   Collection Time: 09/27/14 11:29 AM  Result Value Ref Range   Magnesium 1.6 1.5 - 2.5 mg/dL    Review of systems is positive for bilateral knee pain  Assessment Axis I Maj. depressive disorder Axis II deferred Axis III see medical history Axis IV are to moderate.  Plan: I took her vitals.  I reviewed CC, tobacco/med/surg Hx, meds effects/ side effects, problem list, therapies and responses as well as current situation/symptoms discussed options. She will continue Viibryd 20 mg daily. She will start clonazepam 1 mg 3 times a day to help with the anxiety and Seroquel 100 mg at bedtime to help with anxiety sleep and confusion/paranoia. She will return in 1 week and I strongly suggested to the sister to get her into see her primary doctor since she is complaining of diarrhea.  See orders and pt instructions for more details.  MEDICATIONS this encounter: Meds ordered this encounter  Medications  . Vilazodone HCl (VIIBRYD) 20 MG TABS    Sig: Take 1 tablet (20 mg total) by mouth daily.    Dispense:  30 tablet    Refill:  2  . clonazePAM (KLONOPIN) 1 MG tablet    Sig: Take 1 tablet (1 mg total) by mouth 3 (three) times daily.    Dispense:  90 tablet    Refill:  2  . QUEtiapine (SEROQUEL) 100 MG tablet    Sig: Take 1 tablet (100 mg total) by mouth at bedtime.    Dispense:  30 tablet    Refill:  2    Medical Decision Making Problem Points:  Established problem, stable/improving (1), New problem, with additional work-up planned (4), Review of last therapy session (1) and Review of psycho-social stressors (1) Data Points:  Review or order clinical lab tests (1) Review of new medications or change in dosage (2)  I certify that outpatient services furnished can reasonably be expected to improve the patient's condition.   Levonne Spiller, MD

## 2015-07-04 ENCOUNTER — Other Ambulatory Visit (HOSPITAL_COMMUNITY): Payer: Self-pay | Admitting: *Deleted

## 2015-07-05 ENCOUNTER — Other Ambulatory Visit (HOSPITAL_COMMUNITY): Payer: Self-pay | Admitting: *Deleted

## 2015-07-05 DIAGNOSIS — F333 Major depressive disorder, recurrent, severe with psychotic symptoms: Secondary | ICD-10-CM

## 2015-07-06 ENCOUNTER — Other Ambulatory Visit: Payer: Self-pay | Admitting: *Deleted

## 2015-07-06 DIAGNOSIS — G4700 Insomnia, unspecified: Secondary | ICD-10-CM

## 2015-07-06 DIAGNOSIS — F333 Major depressive disorder, recurrent, severe with psychotic symptoms: Secondary | ICD-10-CM

## 2015-07-06 NOTE — Patient Outreach (Addendum)
[facility] Care Management  07/06/2015  [name] [birth year] [id number]   MD referral Dr. Levonne Spiller. Patient needs help with medication management and compliance.  Per EPIC chart notes patient  was recently inpatient at [facility] for several weeks in Dec 2016. Patient had recent emergency room visit 06/27/2015 for trouble sleeping.   Telephone call to patient; advised of reason for call and of [facility] care management services. Patient gave HIPPA verification.   Patient states major health concern now is taking medications as indicated. States she has been prescribed new medications and frequency of taking one has been difficult to adjust to. States currently she sees psychiatrist on regular basis for medication management. States she has been able to get all of new prescriptions without problems.  States she had recent hospital stay but can not remember why she was admitted.   States has been seeing psychiatrist for depression and has appointment tomorrow 07/07/2015. Has appointment to see primary care 07/11/2015.  States she has assistance from her sister and a friend who take her to MD appointments, grocery shopping and getting prescriptions filled.   Patient consents to referral to [facility] community care coordinator to assist her with medication management.  Will refer to care management assistance to refer to community care coordinator for complex case management -transition of care; medication management. Telephonic signing off. Sherrin Daisy, RN BSN [place] Management Coordinator [facility] Care Management  [phone]

## 2015-07-07 ENCOUNTER — Ambulatory Visit (INDEPENDENT_AMBULATORY_CARE_PROVIDER_SITE_OTHER): Payer: PPO | Admitting: Psychiatry

## 2015-07-07 ENCOUNTER — Other Ambulatory Visit: Payer: Self-pay | Admitting: *Deleted

## 2015-07-07 ENCOUNTER — Encounter (HOSPITAL_COMMUNITY): Payer: Self-pay | Admitting: Psychiatry

## 2015-07-07 VITALS — BP 123/83 | HR 61 | Ht 62.0 in | Wt 203.2 lb

## 2015-07-07 DIAGNOSIS — Z8659 Personal history of other mental and behavioral disorders: Secondary | ICD-10-CM

## 2015-07-07 DIAGNOSIS — F330 Major depressive disorder, recurrent, mild: Secondary | ICD-10-CM | POA: Diagnosis not present

## 2015-07-07 DIAGNOSIS — G4700 Insomnia, unspecified: Secondary | ICD-10-CM

## 2015-07-07 NOTE — Progress Notes (Signed)
[name], female   DOB: [birth year], 70 y.o.   MRN: [id number] [name], female   DOB: 12-02-1945, 70 y.o.   MRN: [id number] [name], female   DOB: June 14, 1946, 70 y.o.   MRN: [id number] [name], female   DOB: 1946-03-30, 70 y.o.   MRN: [id number] [name], female   DOB: 18-Aug-1945, 70 y.o.   MRN: [id number] [name], female   DOB: [birth year], 70 y.o.   MRN: [id number] [name], female   DOB: 10-29-1945, 70 y.o.   MRN: [id number] [name], female   DOB: 1945/09/27, 70 y.o.   MRN: [id number] [name], female   DOB: Jun 20, 1946, 70 y.o.   MRN: [id number] [name], female   DOB: [birth year], 70 y.o.   MRN: [id number] [name], female   DOB: 1945-09-21, 70 y.o.   MRN: [id number] [name], female   DOB: [birth year], 70 y.o.   MRN: [id number] [name], female   DOB: [birth year], 70 y.o.   MRN: [id number] [facility] [id number] Progress Note [name] MRN: [id number] DOB: 1945/07/12 Age: 70 y.o.  Date: 07/07/2015 Start Time: 9:45 AM End Time: 10:10 AM  Chief Complaint: Chief Complaint  Patient presents with  . Depression  . Hallucinations  . Anxiety  . Follow-up    Subjective: "I' just got out of the hospital  This patient is a 70 year old black female who lives with her 33 year old daughter who has mild mental retardation. She lives in [place]. She is a retired [employer] manager. The patient states that she's been depressed "all my life.". She can't give any specific reasons for the depression but notes that at age 55 she was already taking medication to help with sleep. In her 14s her family doctor diagnosed with schizophrenia although she had no symptoms of paranoia or delusions or hallucinations. She was given medication for this.  Since 1997 she's been coming here with  symptoms of depression. She's also had significant problems with fibromyalgia and fatigue. In Neurontin and Ambien have helped. Lately however she's become more depressed and worried. She has had gallbladder surgery and she's concerned about how is going to go. For some reason she's been thinking a lot about death. She doesn't have anyone to stay with her daughter something goes wrong. She's been more negative but is  not suicidal but her energy has dropped. In general she is a very functional person  The patient returns after 4 months. She is accompanied by her sister. She was in [facility] hospital most of last month. Her sister lives in [place] and brought her there after she noticed the patient wasn't making sense see more paranoid and confused and couldn't take care of her basic needs. She stayed in the medical part of the hospital for about 3 weeks and then was transferred to psychiatry. She had a very thorough neurological workup including brain CT MRI and lumbar puncture all of which were noncontributory. It was thought she had a UTI which was treated. She was taken off gabapentin and Ambien. She was tapered off Ativan and went home on no benzodiazepines except enough for 3 days. All she was in the hospital she was treated with Haldol but this was tapered off as well and she is only been discharged on Viibryd and trazodone to  which she is allergic.  The patient is seen after 1 week. Last week she had just gotten out of [facility] for presumed psychosis. She is extremely anxious and I reinstated her clonazepam 1 mg 3 times a day. She had some paranoid thinking so we started Seroquel 100 mg daily at bedtime. She states that she is getting a full night's sleep now and she is less anxious during the day. She states that her leg surgery and she doesn't feel strong enough to drive her car yet. We have contacted [facility] and they going to be checking on her. Denies being depressed or suicidal and denies  current paranoia or auditory or visual hallucinations. She states that she makes a "clicking noise" with her mouth before she goes to sleep but I'm not sure what this is from and I advised her to talk to her dentist   Vitals: BP 123/83 mmHg  Pulse 61  Ht '5\' 2"'  (1.575 m)  Wt 203 lb 3.2 oz (92.171 kg)  BMI 37.16 kg/m2  SpO2 99%  Psychiatric history Patient has a long history of depression.  She has been seeing in this office since May 1997.  Patient denies any history of suicidal attempt however endorse chronic depression and anxiety.  In the past she has taken Prozac but did not like after taking few doses, she also has taken Seroquel, Abilify, Xanax, Pamelor, and amitriptyline.  Patient denies a history of paranoia or delusions however there are times when she has been very depressed and have a lot of negative symptoms.  Alcohol and substance use history Patient denies any history of alcohol or substance use.  Allergies: Allergies  Allergen Reactions  . Elavil [Amitriptyline] Other (See Comments)    Felt really nervous and felt like something was hold her feet or arm and/ or AM headache on it and back on it and went away off it.   . Abilify [Aripiprazole] Other (See Comments)    Dystonic reaction  . Trazodone And Nefazodone Other (See Comments)    Brought back bad dreams of things in the past  . Codeine Hives, Nausea Only and Other (See Comments)    Feels funny  . Latex Hives  . Penicillin V Itching  . Penicillins Hives    Has patient had a PCN reaction causing immediate rash, facial/tongue/throat swelling, SOB or lightheadedness with hypotension: Yes Has patient had a PCN reaction causing severe rash involving mucus membranes or skin necrosis: No Has patient had a PCN reaction that required hospitalization No Has patient had a PCN reaction occurring within the last 10 years: No If all of the above answers are "NO", then may proceed with Cephalosporin use.   . Polyethylene Glycol  Other (See Comments)    Per allergy test  . Sulfonamide Derivatives Nausea And Vomiting  . Cymbalta [Duloxetine Hcl] Rash  . Remeron [Mirtazapine] Other (See Comments)    Caused lots of strange, weird, crazy dreams.   Medical History: Past Medical History  Diagnosis Date  . Hypertension   . Hyperlipidemia   . Gastroesophageal reflux disease   . Diverticulosis   . Anxiety and depression   . Adenomatous colon polyp 2006    excised in 2006 & 2010Due surveillance 06/2013  . Chronic back pain   . Thyroid nodule   . Fibromyalgia   . Migraines   . Insomnia   . Chronic constipation   . Asthma   . History of benign esophageal tumor 2010  granular cell esophageal tumor (Dx 06/2008), resected via EMR 2011, due repeat EGD 02/2012  . Chest discomfort   . Hiatal hernia   . Depression   . Anxiety   . Sleep apnea     Stop Bang score of 5. Pt said she was told by Dr. Merlene Laughter that she had "a little bit" of sleep apena, but not bad enough to treat.  . Breast tumor   . Complication of anesthesia   . PONV (postoperative nausea and vomiting)   . Tumor of esophagus   . Psychosis   Patient has history of hypertension, hyperlipidemia, GERD, diverticulosis, colonic polyp, chronic back pain, fibromyalgia, chronic constipation, benign tumor of esophagus and chronic headache.  Her primary care physician is Dr. Legrand Rams, she sees Dr. Paulita Cradle for chronic pain, fibromyalgia and headache. Her cardiologist is Dr Elvera Maria. Surgical History: Past Surgical History  Procedure Laterality Date  . Tonsillectomy    . Abdominal hysterectomy    . Total knee arthroplasty  2002    Right; previous arthroscopic surgery  . Shoulder arthroscopy      Right; bone spurs removed  . Lung biopsy      negative  . 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 Right oophorectomy      benign disease  . Colonoscopy  06/2008, 06/2011    sigmoid tics, tubular adenoma; 2013: anal canal  hemorrhoids  . Eus  08/2010    Dr [facility] with EGD. Retained food. No recurrent esophageal lesion, bx negative.  . Colonoscopy  07/10/2011    Anal canal hemorrhoids likely the cause of hematochezia in the setting of constipation; otherwise normal rectum ;submucosal  petechiae in left colon of doubtful clinical significance; otherwise, normal colon  . Esophagogastroduodenoscopy  12/11/2011    QGB:[id number] Schatzki's ring; otherwise normal/Small hiatal hernia. Antral and bulbar erosions  . Bravo ph study  12/11/2011    Procedure: BRAVO [place] STUDY;  Surgeon: Daneil Dolin, MD;  Location: [facility] END[facility] SUITE;  Service: Endoscopy;  Laterality: N/A;  . Cholecystectomy N/A 04/09/2013    Procedure: LAPAROSCOPIC CHOLECYSTECTOMY;  Surgeon: Jamesetta So, MD;  Location: [facility] ORS;  Service: General;  Laterality: N/A;  . Esophagogastroduodenoscopy (egd) with propofol N/A 05/08/2015    Procedure: ESOPHAGOGASTRODUODENOSCOPY (EGD) WITH PROPOFOL;  Surgeon: Daneil Dolin, MD;  Location: [facility] ORS;  Service: Endoscopy;  Laterality: N/A;  0730  . Esophageal dilation N/A 05/08/2015    Procedure: ESOPHAGEAL DILATION;  Surgeon: Daneil Dolin, MD;  Location: [facility] ORS;  Service: Endoscopy;  Laterality: N/A;  [name] [age]   Family History: family history includes Alcohol abuse in her father; Anxiety disorder in her mother; Colon cancer in her paternal aunt and paternal uncle; Dementia in her maternal uncle; Depression in her mother; Heart attack in her mother; Stroke in her father. There is no history of ADD / ADHD, Bipolar disorder, Drug abuse, OCD, Paranoid behavior, Schizophrenia, Seizures, Sexual abuse, or Physical abuse. Reviewed and nothing new today again.  Mental status examination Patient is casually dressed and fairly groomed. She is calm cooperative and maintained fair eye contact. She described her mood as is improved but she is still little anxious She still has chronic pain  Her speech is coherent but slow.  Her  thought processes clear and logical. She denies suicidal ideation and no active or passive homicidal thoughts e. She denies any auditory or visual hallucination. There no psychotic symptoms present at this time. She's alert and  oriented x3. Her insight judgment impulse control is okay her fund of knowledge is fair and her language skills are good  Lab Results:  Results for orders placed or performed during the hospital encounter of 05/01/15 (from the past 8736 hour(s))  CBC with Differential/Platelet   Collection Time: 05/01/15  2:10 PM  Result Value Ref Range   WBC 4.6 4.0 - 10.5 K/uL   RBC 4.71 3.87 - 5.11 MIL/uL   Hemoglobin 12.5 12.0 - 15.0 g/dL   HCT 39.5 36.0 - 46.0 %   MCV 83.9 78.0 - 100.0 fL   MCH 26.5 26.0 - 34.0 pg   MCHC 31.6 30.0 - 36.0 g/dL   RDW 15.0 11.5 - 15.5 %   Platelets 188 150 - 400 K/uL   Neutrophils Relative % 53 %   Neutro Abs 2.4 1.7 - 7.7 K/uL   Lymphocytes Relative 39 %   Lymphs Abs 1.8 0.7 - 4.0 K/uL   Monocytes Relative 6 %   Monocytes Absolute 0.3 0.1 - 1.0 K/uL   Eosinophils Relative 2 %   Eosinophils Absolute 0.1 0.0 - 0.7 K/uL   Basophils Relative 0 %   Basophils Absolute 0.0 0.0 - 0.1 K/uL  Basic metabolic panel   Collection Time: 05/01/15  2:10 PM  Result Value Ref Range   Sodium 141 135 - 145 mmol/L   Potassium 3.9 3.5 - 5.1 mmol/L   Chloride 107 101 - 111 mmol/L   CO2 26 22 - 32 mmol/L   Glucose, Bld 95 65 - 99 mg/dL   BUN 10 6 - 20 mg/dL   Creatinine, Ser 1.13 (H) 0.44 - 1.00 mg/dL   Calcium 10.2 8.9 - 10.3 mg/dL   GFR calc non Af Amer 48 (L) >60 mL/min   GFR calc Af Amer 56 (L) >60 mL/min   Anion gap 8 5 - 15  Results for orders placed or performed in visit on 04/25/15 (from the past 8736 hour(s))  Clostridium Difficile by PCR   Collection Time: 05/03/15 11:17 AM  Result Value Ref Range   Toxigenic C Difficile by pcr Not Detected Not Detected  Results for orders placed or performed in visit on 09/29/14 (from the past 8736 hour(s))   Basic Metabolic Panel (BMET)   Collection Time: 10/29/14 10:32 AM  Result Value Ref Range   Sodium 141 135 - 145 mEq/L   Potassium 3.6 3.5 - 5.3 mEq/L   Chloride 104 96 - 112 mEq/L   CO2 28 19 - 32 mEq/L   Glucose, Bld 95 70 - 99 mg/dL   BUN 17 6 - 23 mg/dL   Creat 1.10 0.50 - 1.10 mg/dL   Calcium 10.2 8.4 - 10.5 mg/dL  Magnesium   Collection Time: 10/29/14 10:32 AM  Result Value Ref Range   Magnesium 2.1 1.5 - 2.5 mg/dL  Results for orders placed or performed in visit on 09/27/14 (from the past 8736 hour(s))  Lipid Profile   Collection Time: 09/27/14 11:29 AM  Result Value Ref Range   Cholesterol 173 0 - 200 mg/dL   Triglycerides 88 <150 mg/dL   HDL 61 >=46 mg/dL   Total CHOL/HDL Ratio 2.8 Ratio   VLDL 18 0 - 40 mg/dL   LDL Cholesterol 94 0 - 99 mg/dL  Basic Metabolic Panel (BMET)   Collection Time: 09/27/14 11:29 AM  Result Value Ref Range   Sodium 141 135 - 145 mEq/L   Potassium 3.8 3.5 - 5.3 mEq/L   Chloride 101 96 - 112  mEq/L   CO2 29 19 - 32 mEq/L   Glucose, Bld 94 70 - 99 mg/dL   BUN 15 6 - 23 mg/dL   Creat 1.05 0.50 - 1.10 mg/dL   Calcium 9.8 8.4 - 10.5 mg/dL  CBC with Differential   Collection Time: 09/27/14 11:29 AM  Result Value Ref Range   WBC 5.8 4.0 - 10.5 K/uL   RBC 4.92 3.87 - 5.11 MIL/uL   Hemoglobin 12.7 12.0 - 15.0 g/dL   HCT 40.0 36.0 - 46.0 %   MCV 81.3 78.0 - 100.0 fL   MCH 25.8 (L) 26.0 - 34.0 pg   MCHC 31.8 30.0 - 36.0 g/dL   RDW 15.8 (H) 11.5 - 15.5 %   Platelets 207 150 - 400 K/uL   MPV 9.5 8.6 - 12.4 fL   Neutrophils Relative % 55 43 - 77 %   Neutro Abs 3.2 1.7 - 7.7 K/uL   Lymphocytes Relative 34 12 - 46 %   Lymphs Abs 2.0 0.7 - 4.0 K/uL   Monocytes Relative 8 3 - 12 %   Monocytes Absolute 0.5 0.1 - 1.0 K/uL   Eosinophils Relative 2 0 - 5 %   Eosinophils Absolute 0.1 0.0 - 0.7 K/uL   Basophils Relative 1 0 - 1 %   Basophils Absolute 0.1 0.0 - 0.1 K/uL   Smear Review Criteria for review not met   Magnesium   Collection Time:  09/27/14 11:29 AM  Result Value Ref Range   Magnesium 1.6 1.5 - 2.5 mg/dL   Review of systems is positive for bilateral knee pain  Assessment Axis I Maj. depressive disorder Axis II deferred Axis III see medical history Axis IV are to moderate.  Plan: I took her vitals.  I reviewed CC, tobacco/med/surg Hx, meds effects/ side effects, problem list, therapies and responses as well as current situation/symptoms discussed options. She will continue Viibryd 20 mg daily. She will continue clonazepam 1 mg 3 times a day to help with the anxiety and Seroquel 100 mg at bedtime to help with anxiety sleep and confusion/paranoia. She will return in 3 weeks  See orders and pt instructions for more details.  MEDICATIONS this encounter: No orders of the defined types were placed in this encounter.    Medical Decision Making Problem Points:  Established problem, stable/improving (1), New problem, with additional work-up planned (4), Review of last therapy session (1) and Review of psycho-social stressors (1) Data Points:  Review or order clinical lab tests (1) Review of new medications or change in dosage (2)  I certify that outpatient services furnished can reasonably be expected to improve the patient's condition.   Levonne Spiller, MD

## 2015-07-07 NOTE — Patient Outreach (Signed)
[facility]) Care Management  07/07/2015  [name] 1946/06/24 [id number]   Referral made to pharmacy for medication management.  Sent to care management assistant to assign. Community care coordinator notified of referral. Telephonic signing off.  Sherrin Daisy, RN BSN [place] Management Coordinator [facility] Care Management  [phone]

## 2015-07-10 ENCOUNTER — Other Ambulatory Visit: Payer: Self-pay | Admitting: *Deleted

## 2015-07-10 NOTE — Patient Outreach (Signed)
Call to patient to discuss referral to [place] community Reviewed program with patient and she agrees to home visit with RN and also if pharmacist can coordinate co-visit she is fine with both visiting. Scheduled visit for 07/14/15, gave RNCM contact information for reference and if she needs to contact [facility] for any changes.  Royetta Crochet. Laymond Purser, RN, BSN, [place] [phone]

## 2015-07-11 ENCOUNTER — Other Ambulatory Visit (HOSPITAL_COMMUNITY): Payer: Self-pay | Admitting: Internal Medicine

## 2015-07-11 DIAGNOSIS — N6320 Unspecified lump in the left breast, unspecified quadrant: Secondary | ICD-10-CM

## 2015-07-11 DIAGNOSIS — M810 Age-related osteoporosis without current pathological fracture: Secondary | ICD-10-CM

## 2015-07-11 DIAGNOSIS — Z09 Encounter for follow-up examination after completed treatment for conditions other than malignant neoplasm: Secondary | ICD-10-CM

## 2015-07-14 ENCOUNTER — Encounter: Payer: Self-pay | Admitting: *Deleted

## 2015-07-14 ENCOUNTER — Other Ambulatory Visit: Payer: Self-pay | Admitting: *Deleted

## 2015-07-14 ENCOUNTER — Other Ambulatory Visit: Payer: Self-pay

## 2015-07-14 NOTE — Patient Outreach (Signed)
[name] Care Management   07/14/2015  [name] 11/27/1945 [id number]  [name] is an 70 y.o. female   Co visit with [name], Pharm-D Daughter [name], present but not participating  Subjective:  Patient reports she was inpatient for 4 weeks and has been home for about 3 weeks. Patient waiting to hear from [facility] for about surgery for parathyroid, she thinks is low.  Patient self reports she missing her middle of the day dose of medication, reports compliance with morning and evening doses. She has a weekly med planner box, but has not been using.  Patient is willing to try the medication alarm to remind her or mid day medication.  Objective:  BP 122/80 mmHg  Pulse 72  Resp 20  Ht 1.575 m (5\' 2" )  Wt 202 lb (91.627 kg)  BMI 36.94 kg/m2  SpO2 98%  Review of Systems  Constitutional: Negative.   HENT: Negative.   Eyes: Negative.   Respiratory: Negative.        History of asthmatic bronchitis  Cardiovascular: Positive for leg swelling.  Gastrointestinal: Negative.   Genitourinary: Negative.   Musculoskeletal: Positive for back pain and joint pain.       Knee replacement over 15 years ago, "knees worn out again"  Skin: Negative.   Neurological: Negative.   Psychiatric/Behavioral: Positive for depression.    Physical Exam  Constitutional: She is oriented to person, place, and time. She appears well-developed and well-nourished.  Neck: Normal range of motion.  Cardiovascular: Normal rate.   Respiratory: Effort normal and breath sounds normal.  GI: Soft. Bowel sounds are normal.  Musculoskeletal: Normal range of motion.  Neurological: She is alert and oriented to person, place, and time.  Skin: Skin is warm and dry.    Current Medications:   Current Outpatient Prescriptions  Medication Sig Dispense Refill  . acetaminophen (TYLENOL) 500 MG tablet Take 500 mg by mouth every 8 (eight) hours as needed for mild pain or moderate pain.     Marland Kitchen albuterol (PROVENTIL HFA;VENTOLIN HFA) 108 (90 BASE) MCG/ACT inhaler Inhale 2 puffs into the lungs every 6 (six) hours as needed. For shortness of breath    . atenolol (TENORMIN) 50 MG tablet Take 1 tablet (50 mg total) by mouth 2 (two) times daily. 180 tablet 2  . cholecalciferol (VITAMIN D) 1000 units tablet Take 1,000 Units by mouth daily.    . clonazePAM (KLONOPIN) 1 MG tablet Take 1 tablet (1 mg total) by mouth 3 (three) times daily. 90 tablet 2  . fluticasone (FLONASE) 50 MCG/ACT nasal spray Place 1 spray into both nostrils 2 (two) times daily.    Marland Kitchen lisinopril-hydrochlorothiazide (PRINZIDE,ZESTORETIC) 20-25 MG per tablet Take 1 tablet by mouth daily. 90 tablet 3  . potassium chloride SA (K-DUR,KLOR-CON) 20 MEQ tablet TAKE ONE (1) TABLET EACH DAY 180 tablet 1  . QUEtiapine (SEROQUEL) 100 MG tablet Take 1 tablet (100 mg total) by mouth at bedtime. 30 tablet 2  . Vilazodone HCl (VIIBRYD) 20 MG TABS Take 1 tablet (20 mg total) by mouth daily. 30 tablet 2   No current facility-administered medications for this visit.    Functional Status:   In your present state of health, do you have any difficulty performing the following activities: 05/01/2015  Hearing? N  Vision? N  Difficulty concentrating or making decisions? N  Walking or climbing stairs? N  Dressing or bathing? N    Fall/Depression Screening:    Fall Risk  07/14/2015 07/06/2015  Falls in the past year? No No  Risk for fall due to : - Medication side effect   PHQ 2/9 Scores 07/14/2015  PHQ - 2 Score 0    Assessment:   Depression-managed by MD Medication adherence-[facility] pharmacist reviewed medications and assisted with medication adherence plan during visit today  Plan:  Follow up in a month telephonically and assess for further needs, possible refer to [place]. Laymond Purser, RN, BSN, [place] [phone]

## 2015-07-14 NOTE — Patient Outreach (Signed)
07/13/14  Subjective:  [name] is a 70 year old female who was referred to pharmacy for medication management.  I am preforming the home visit with Donna Amor, RN.  She stated she forgets to take her afternoon medication.  She stated she never forgets to take her morning or evening medications.  Upon review of her problems, I noted she has hyperlipidemia (ASCVD risk score of 8.6%) and ask if she was taking anything for her cholesterol. She stated she was given lovastatin but is not taking it because her sister told her not to take it.  I asked if there were any other medications her sister told her not to take.  She stated yes but she recently took them to the police department to dispose of them.  She stated she did have her discharge summary from [facility] which had a list of medications and she could tell me which ones she was not taking.  Included on that list was Amlodipine 5 mg daily, Magnesium 400 mg 2 tab twice a day, Furosemide 40 mg daily as needed, Melatonin 3 mg at bedtime, and Pantoprazole 40 mg daily.  When I asked why her sister told her not to take them, she stated she sister did not want her to take any medications.  She did state she was also experiencing diarrhea daily.  She is holding her alendronate due to having surgery on her parathyroid in the near future.   Objective:  Outpatient Encounter Prescriptions as of 07/14/2015  Medication Sig Note  . acetaminophen (TYLENOL) 500 MG tablet Take 500 mg by mouth every 8 (eight) hours as needed for mild pain or moderate pain.   Marland Kitchen albuterol (PROVENTIL HFA;VENTOLIN HFA) 108 (90 BASE) MCG/ACT inhaler Inhale 2 puffs into the lungs every 6 (six) hours as needed. For shortness of breath   . atenolol (TENORMIN) 50 MG tablet Take 1 tablet (50 mg total) by mouth 2 (two) times daily.   . cholecalciferol (VITAMIN D) 1000 units tablet Take 1,000 Units by mouth daily.   . clonazePAM (KLONOPIN) 1 MG tablet Take 1 tablet (1 mg total) by mouth 3 (three)  times daily.   . fluticasone (FLONASE) 50 MCG/ACT nasal spray Place 1 spray into both nostrils 2 (two) times daily. 07/14/2015: Using once a day  . lisinopril-hydrochlorothiazide (PRINZIDE,ZESTORETIC) 20-25 MG per tablet Take 1 tablet by mouth daily.   . Loperamide-Simethicone (IMODIUM ADVANCED) 2-125 MG TABS Take 1 tablet by mouth daily.   . potassium chloride SA (K-DUR,KLOR-CON) 20 MEQ tablet TAKE ONE (1) TABLET EACH DAY   . QUEtiapine (SEROQUEL) 100 MG tablet Take 1 tablet (100 mg total) by mouth at bedtime.   . Vilazodone HCl (VIIBRYD) 20 MG TABS Take 1 tablet (20 mg total) by mouth daily.    No facility-administered encounter medications on file as of 07/14/2015.   Assessment:  Drugs sorted by system:  Neurologic/Psychologic: Viibryd, quetiapine, clonazepam  Cardiovascular: lisinopril-hydrochlorothiazide  Pulmonary/Allergy: albuterol, fluticasone  Gastrointestinal: Imodium  Endocrine: none  Renal: none  Topical: none  Pain: acetaminophen  Vitamins/Minerals: potassium, vitamin D  Infectious Diseases:  Miscellaneous: none   Duplications in therapy: none Gaps in therapy: Not on statin, not taking prescribed amlodipine, furosemide, magnesium, melantoin, and pantoprazole Medications to avoid in the elderly: none Drug interactions: none Other issues noted: Diarrhea may be due to [place].  Over 25% of user experience diarrhea with it.   Inhaler technique: Demonstrated proper inhaler technique  Plan: 1.  I set up a medication alarm for  her to alarm at 4 pm to remind her of the afternoon doses.  I will follow up with Dr. Levonne Spiller to determine if there are other pharmacy issues that she would like me to address.  [name] is adherent to her morning and nighttime doses.  She is filling her medications on time.   2.  Diarrhea may be due to [place].  I will follow up Dr. Levonne Spiller to address this side effect.   3.  I will send a letter to her cardiologist and PCP to  alert them to [name] not taking some of her medications, especially the ones that were given to her while she was at [facility]  4.  I will follow up via phone call in 3 to 4 week to see how she is doing with her adherence to her afternoon doses.    THN CM Care Plan Problem One        Most Recent Value   Care Plan Problem One  Medication adherence   Role Documenting the Problem One  Clinical Pharmacist   Care Plan for Problem One  Active   THN CM Short Term Goal #1 (0-30 days)  [name] will take her clonazepam three times a day 80% of the time for the next 30 days evidient by patient report   THN CM Short Term Goal #1 Start Date  07/14/15   Interventions for Short Term Goal #1  I educated [name] on the importance of medication adherence. I gave her a medication alarm that will help her remember to take her afternoon dose of clonazepam which is the dose she is forgetting.      Deanne Coffer, PharmD, [place] [phone]

## 2015-07-17 ENCOUNTER — Other Ambulatory Visit: Payer: Self-pay

## 2015-07-17 MED ORDER — POTASSIUM CHLORIDE CRYS ER 20 MEQ PO TBCR: EXTENDED_RELEASE_TABLET | ORAL | Status: DC

## 2015-07-17 MED ORDER — LISINOPRIL-HYDROCHLOROTHIAZIDE 20-25 MG PO TABS: 1.0000 | ORAL_TABLET | Freq: Every day | ORAL | Status: DC

## 2015-07-17 MED ORDER — ATENOLOL 50 MG PO TABS: 50.0000 mg | ORAL_TABLET | Freq: Two times a day (BID) | ORAL | Status: DC

## 2015-07-24 ENCOUNTER — Other Ambulatory Visit (HOSPITAL_COMMUNITY): Payer: Self-pay | Admitting: Internal Medicine

## 2015-07-24 DIAGNOSIS — Z09 Encounter for follow-up examination after completed treatment for conditions other than malignant neoplasm: Secondary | ICD-10-CM

## 2015-07-24 DIAGNOSIS — N6320 Unspecified lump in the left breast, unspecified quadrant: Secondary | ICD-10-CM

## 2015-07-25 ENCOUNTER — Ambulatory Visit (HOSPITAL_COMMUNITY)
Admission: RE | Admit: 2015-07-25 | Discharge: 2015-07-25 | Disposition: A | Discharge status: Home / Self Care | Payer: PPO | Source: Ambulatory Visit | Attending: Internal Medicine | Admitting: Internal Medicine

## 2015-07-25 DIAGNOSIS — Z09 Encounter for follow-up examination after completed treatment for conditions other than malignant neoplasm: Secondary | ICD-10-CM

## 2015-07-25 DIAGNOSIS — R296 Repeated falls: Secondary | ICD-10-CM | POA: Diagnosis not present

## 2015-07-25 DIAGNOSIS — E213 Hyperparathyroidism, unspecified: Secondary | ICD-10-CM | POA: Insufficient documentation

## 2015-07-25 DIAGNOSIS — Z780 Asymptomatic menopausal state: Secondary | ICD-10-CM | POA: Diagnosis not present

## 2015-07-25 DIAGNOSIS — N63 Unspecified lump in breast: Secondary | ICD-10-CM | POA: Diagnosis present

## 2015-07-25 DIAGNOSIS — N6320 Unspecified lump in the left breast, unspecified quadrant: Secondary | ICD-10-CM

## 2015-07-25 DIAGNOSIS — M8588 Other specified disorders of bone density and structure, other site: Secondary | ICD-10-CM | POA: Diagnosis not present

## 2015-07-25 DIAGNOSIS — E559 Vitamin D deficiency, unspecified: Secondary | ICD-10-CM | POA: Diagnosis not present

## 2015-07-25 DIAGNOSIS — M810 Age-related osteoporosis without current pathological fracture: Secondary | ICD-10-CM | POA: Diagnosis not present

## 2015-07-26 ENCOUNTER — Ambulatory Visit (INDEPENDENT_AMBULATORY_CARE_PROVIDER_SITE_OTHER): Payer: Medicare Other | Admitting: Psychiatry

## 2015-07-26 ENCOUNTER — Emergency Department (HOSPITAL_COMMUNITY)
Admission: EM | Admit: 2015-07-26 | Discharge: 2015-07-26 | Disposition: A | Discharge status: Home / Self Care | Payer: PPO | Attending: Emergency Medicine | Admitting: Emergency Medicine

## 2015-07-26 ENCOUNTER — Encounter (HOSPITAL_COMMUNITY): Payer: Self-pay | Admitting: Psychiatry

## 2015-07-26 ENCOUNTER — Ambulatory Visit (INDEPENDENT_AMBULATORY_CARE_PROVIDER_SITE_OTHER): Payer: Medicare Other | Admitting: Gastroenterology

## 2015-07-26 ENCOUNTER — Telehealth (HOSPITAL_COMMUNITY): Payer: Self-pay | Admitting: *Deleted

## 2015-07-26 ENCOUNTER — Encounter (HOSPITAL_COMMUNITY): Payer: Self-pay | Admitting: Emergency Medicine

## 2015-07-26 ENCOUNTER — Encounter: Payer: Self-pay | Admitting: Gastroenterology

## 2015-07-26 ENCOUNTER — Other Ambulatory Visit: Payer: Self-pay

## 2015-07-26 VITALS — BP 170/98 | HR 67 | Ht 62.0 in | Wt 206.2 lb

## 2015-07-26 VITALS — BP 174/84 | HR 57 | Temp 97.3°F | Ht 62.0 in | Wt 208.4 lb

## 2015-07-26 DIAGNOSIS — Z86018 Personal history of other benign neoplasm: Secondary | ICD-10-CM | POA: Diagnosis not present

## 2015-07-26 DIAGNOSIS — G4700 Insomnia, unspecified: Secondary | ICD-10-CM | POA: Insufficient documentation

## 2015-07-26 DIAGNOSIS — R109 Unspecified abdominal pain: Secondary | ICD-10-CM

## 2015-07-26 DIAGNOSIS — Z79899 Other long term (current) drug therapy: Secondary | ICD-10-CM | POA: Diagnosis not present

## 2015-07-26 DIAGNOSIS — F330 Major depressive disorder, recurrent, mild: Secondary | ICD-10-CM | POA: Diagnosis not present

## 2015-07-26 DIAGNOSIS — Z853 Personal history of malignant neoplasm of breast: Secondary | ICD-10-CM | POA: Insufficient documentation

## 2015-07-26 DIAGNOSIS — R51 Headache: Secondary | ICD-10-CM

## 2015-07-26 DIAGNOSIS — G8929 Other chronic pain: Secondary | ICD-10-CM | POA: Diagnosis not present

## 2015-07-26 DIAGNOSIS — Z91040 Latex allergy status: Secondary | ICD-10-CM | POA: Diagnosis not present

## 2015-07-26 DIAGNOSIS — Z880 Allergy status to penicillin: Secondary | ICD-10-CM | POA: Diagnosis not present

## 2015-07-26 DIAGNOSIS — R1013 Epigastric pain: Secondary | ICD-10-CM

## 2015-07-26 DIAGNOSIS — Z8719 Personal history of other diseases of the digestive system: Secondary | ICD-10-CM | POA: Insufficient documentation

## 2015-07-26 DIAGNOSIS — J45909 Unspecified asthma, uncomplicated: Secondary | ICD-10-CM | POA: Insufficient documentation

## 2015-07-26 DIAGNOSIS — I10 Essential (primary) hypertension: Secondary | ICD-10-CM | POA: Diagnosis not present

## 2015-07-26 DIAGNOSIS — R519 Headache, unspecified: Secondary | ICD-10-CM

## 2015-07-26 DIAGNOSIS — F419 Anxiety disorder, unspecified: Secondary | ICD-10-CM | POA: Insufficient documentation

## 2015-07-26 DIAGNOSIS — F329 Major depressive disorder, single episode, unspecified: Secondary | ICD-10-CM | POA: Insufficient documentation

## 2015-07-26 DIAGNOSIS — Z86010 Personal history of colonic polyps: Secondary | ICD-10-CM | POA: Insufficient documentation

## 2015-07-26 DIAGNOSIS — Z7951 Long term (current) use of inhaled steroids: Secondary | ICD-10-CM | POA: Diagnosis not present

## 2015-07-26 LAB — COMPLETE METABOLIC PANEL WITH GFR
ALT: 12 U/L (ref 6–29)
AST: 15 U/L (ref 10–35)
Albumin: 3.9 g/dL (ref 3.6–5.1)
Alkaline Phosphatase: 48 U/L (ref 33–130)
BUN: 8 mg/dL (ref 7–25)
CO2: 23 mmol/L (ref 20–31)
Calcium: 10.7 mg/dL — ABNORMAL HIGH (ref 8.6–10.4)
Chloride: 108 mmol/L (ref 98–110)
Creat: 1.03 mg/dL — ABNORMAL HIGH (ref 0.50–0.99)
GFR, Est African American: 64 mL/min (ref >=60)
GFR, Est Non African American: 56 mL/min — ABNORMAL LOW (ref >=60)
Glucose, Bld: 96 mg/dL (ref 65–99)
Potassium: 4 mmol/L (ref 3.5–5.3)
Sodium: 140 mmol/L (ref 135–146)
Total Bilirubin: 0.5 mg/dL (ref 0.2–1.2)
Total Protein: 6.2 g/dL (ref 6.1–8.1)

## 2015-07-26 MED ORDER — KETOROLAC TROMETHAMINE 60 MG/2ML IM SOLN
60.0000 mg | Freq: Once | INTRAMUSCULAR | Status: AC
  Administered 2015-07-26: 60 mg via INTRAMUSCULAR
  Filled 2015-07-26: qty 2

## 2015-07-26 NOTE — ED Provider Notes (Signed)
CSN: [id number]     Arrival date & time 07/26/15  1516 History   First MD Initiated Contact with Patient 07/26/15 1542     Chief Complaint  Patient presents with  . Hypertension  . Headache     (Consider location/radiation/quality/duration/timing/severity/associated sxs/prior Treatment) HPI Comments: The patient is a 70 year old female, she has had a recent cold located medical history involving a prolonged admission to [facility] after she was found to have some altered mental status. She had a very complete workup done including MRI, EEG, lumbar puncture as well as multiple other studies, no definite etiology was found and the patient was discharged home. Today she was following up with a couple of her doctors including the gastroenterologist and her family doctor, they did not have room to see her at the family doctor because of reported hypertension and that she was referred to the emergency department. She is complaining of a headache as well as some high blood pressure. She denies any other symptoms, she has had a chronic confusion and memory loss which is not new. She has been taking her medications as prescribed by her doctor including 2 blood pressure medications including lisinopril, hydrochlorothiazide mixed tablet as well as atenolol.  Patient is a 70 y.o. female presenting with hypertension and headaches. The history is provided by the patient.  Hypertension Associated symptoms include headaches.  Headache   Past Medical History  Diagnosis Date  . Hypertension   . Hyperlipidemia   . Gastroesophageal reflux disease   . Diverticulosis   . Anxiety and depression   . Adenomatous colon polyp 2006    excised in 2006 & 2010Due surveillance 06/2013  . Chronic back pain   . Thyroid nodule   . Fibromyalgia   . Migraines   . Insomnia   . Chronic constipation   . Asthma   . History of benign esophageal tumor 2010    granular cell esophageal tumor (Dx 06/2008), resected via EMR  2011, due repeat EGD 02/2012  . Chest discomfort   . Hiatal hernia   . Depression   . Anxiety   . Sleep apnea     Stop Bang score of 5. Pt said she was told by Dr. Merlene Laughter that she had "a little bit" of sleep apena, but not bad enough to treat.  . Breast tumor   . Complication of anesthesia   . PONV (postoperative nausea and vomiting)   . Tumor of esophagus   . Psychosis    Past Surgical History  Procedure Laterality Date  . Tonsillectomy    . Abdominal hysterectomy    . Total knee arthroplasty  2002    Right; previous arthroscopic surgery  . Shoulder arthroscopy      Right; bone spurs removed  . Lung biopsy      negative  . 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 Right oophorectomy      benign disease  . Colonoscopy  06/2008, 06/2011    sigmoid tics, tubular adenoma; 2013: anal canal hemorrhoids  . Eus  08/2010    Dr [facility] with EGD. Retained food. No recurrent esophageal lesion, bx negative.  . Colonoscopy  07/10/2011    Anal canal hemorrhoids likely the cause of hematochezia in the setting of constipation; otherwise normal rectum ;submucosal  petechiae in left colon of doubtful clinical significance; otherwise, normal colon  . Esophagogastroduodenoscopy  12/11/2011    [id number] Schatzki's ring; otherwise normal/Small hiatal hernia. Antral and  bulbar erosions  . Bravo ph study  12/11/2011    Procedure: BRAVO Wink STUDY;  Surgeon: Daneil Dolin, MD;  Location: [facility] END[facility] SUITE;  Service: Endoscopy;  Laterality: N/A;  . Cholecystectomy N/A 04/09/2013    Procedure: LAPAROSCOPIC CHOLECYSTECTOMY;  Surgeon: Jamesetta So, MD;  Location: [facility] ORS;  Service: General;  Laterality: N/A;  . Esophagogastroduodenoscopy (egd) with propofol N/A 05/08/2015    Dr. Gala Romney: mild erosive reflux esophagitis, non-critical Schatzki's ring s/p dilation. Hiatal hernia.   . Esophageal dilation N/A 05/08/2015    Procedure: ESOPHAGEAL DILATION;  Surgeon:  Daneil Dolin, MD;  Location: [facility] ORS;  Service: Endoscopy;  Laterality: N/ANathaniel Man   Family History  Problem Relation Age of Onset  . Stroke Father   . Alcohol abuse Father   . Heart attack Mother   . Depression Mother   . Anxiety disorder Mother   . Colon cancer Paternal Aunt   . Colon cancer Paternal Uncle   . Dementia Maternal Uncle   . ADD / ADHD Neg Hx   . Bipolar disorder Neg Hx   . Drug abuse Neg Hx   . OCD Neg Hx   . Paranoid behavior Neg Hx   . Schizophrenia Neg Hx   . Seizures Neg Hx   . Sexual abuse Neg Hx   . Physical abuse Neg Hx    Social History  Substance Use Topics  . Smoking status: Never Smoker   . Smokeless tobacco: Never Used  . Alcohol Use: No   OB History    No data available     Review of Systems  Neurological: Positive for headaches.      Allergies  Elavil; Abilify; Trazodone and nefazodone; Codeine; Latex; Penicillin v; Penicillins; Polyethylene glycol; Sulfonamide derivatives; Cymbalta; and Remeron  Home Medications   Prior to Admission medications   Medication Sig Start Date End Date Taking? Authorizing Provider  acetaminophen (TYLENOL) 500 MG tablet Take 500 mg by mouth every 8 (eight) hours as needed for mild pain or moderate pain.    Historical Provider, MD  albuterol (PROVENTIL HFA;VENTOLIN HFA) 108 (90 BASE) MCG/ACT inhaler Inhale 2 puffs into the lungs every 6 (six) hours as needed. For shortness of breath    Historical Provider, MD  atenolol (TENORMIN) 50 MG tablet Take 1 tablet (50 mg total) by mouth 2 (two) times daily. 07/17/15   Arnoldo Lenis, MD  cholecalciferol (VITAMIN D) 1000 units tablet Take 1,000 Units by mouth daily.    Historical Provider, MD  clonazePAM (KLONOPIN) 1 MG tablet Take 1 tablet (1 mg total) by mouth 3 (three) times daily. 06/30/15 06/29/16  Cloria Spring, MD  fluticasone (FLONASE) 50 MCG/ACT nasal spray Place 1 spray into both nostrils 2 (two) times daily.    Historical Provider, MD   lisinopril-hydrochlorothiazide (PRINZIDE,ZESTORETIC) 20-25 MG tablet Take 1 tablet by mouth daily. 07/17/15   Arnoldo Lenis, MD  potassium chloride SA (K-DUR,KLOR-CON) 20 MEQ tablet TAKE ONE (1) TABLET EACH DAY 07/17/15   Arnoldo Lenis, MD  QUEtiapine (SEROQUEL) 100 MG tablet Take 1 tablet (100 mg total) by mouth at bedtime. 06/30/15 06/29/16  Cloria Spring, MD  Vilazodone HCl (VIIBRYD) 20 MG TABS Take 1 tablet (20 mg total) by mouth daily. 06/30/15   Cloria Spring, MD   BP 179/87 mmHg  Pulse 81  Temp(Src) 98.8 F (37.1 C) (Oral)  Resp 16  Ht 5\' 2"  (1.575 m)  Wt 202 lb (91.627 kg)  BMI  36.94 kg/m2  SpO2 100% Physical Exam  Constitutional: She appears well-developed and well-nourished. No distress.  HENT:  Head: Normocephalic and atraumatic.  Mouth/Throat: Oropharynx is clear and moist. No oropharyngeal exudate.  Eyes: Conjunctivae and EOM are normal. Pupils are equal, round, and reactive to light. Right eye exhibits no discharge. Left eye exhibits no discharge. No scleral icterus.  Neck: Normal range of motion. Neck supple. No JVD present. No thyromegaly present.  Cardiovascular: Normal rate, regular rhythm, normal heart sounds and intact distal pulses.  Exam reveals no gallop and no friction rub.   No murmur heard. Pulmonary/Chest: Effort normal and breath sounds normal. No respiratory distress. She has no wheezes. She has no rales.  Abdominal: Soft. Bowel sounds are normal. She exhibits no distension and no mass. There is no tenderness.  Musculoskeletal: Normal range of motion. She exhibits no edema or tenderness.  Lymphadenopathy:    She has no cervical adenopathy.  Neurological: She is alert. Coordination normal.  No tremor, normal finger-nose-finger, normal strength in all 4 extremities, normal mental status, normal speech, memory loss is present and according to the family member is baseline  Skin: Skin is warm and dry. No rash noted. No erythema.  Psychiatric: She has a  normal mood and affect. Her behavior is normal.  Nursing note and vitals reviewed.   ED Course  Procedures (including critical care time) Labs Review Labs Reviewed - No data to display  Imaging Review Dg Bone Density  07/25/2015  EXAM: DUAL X-RAY ABSORPTIOMETRY (DXA) FOR BONE MINERAL DENSITY IMPRESSION: Ordering Physician:  Dr. Rosita Fire, Your patient Ashlan Geppert completed a BMD test on 07/25/2015 using the [place] (software version: 14.10) manufactured by [employer]. The following summarizes the results of our evaluation. PATIENT BIOGRAPHICAL: Name: AHLIYAH, FROTHINGHAM Patient ID: [id number] Birth Date: 22-Sep-1945 Height: 62.0 in. Gender: Female Exam Date: 07/25/2015 Weight: 205.0 lbs. Indications: Early Menopause, Follow up Osteopenia, History of Fracture (Adult), Hyperparathyroid, Partial Hysterectomy, Post Menopausal, Recurrent Falls, Unilateral oophrectomy, Vitamin D Deficiency Fractures: Elbow Treatments: Actonel, Vitamin D DENSITOMETRY RESULTS: Site      Region     Measured Date Measured Age WHO Classification Young Adult T-score BMD         %Change vs. Previous Significant Change (*) AP Spine L1-L3 07/25/2015 69.5 Oste[age] -2.0 0.931 g/cm2 -3.0% Yes AP Spine L1-L3 09/20/2004 58.6 Osteopenia -1.8 0.960 g/cm2 - - DualFemur Neck Right 07/25/2015 69.5 Osteoporosis -2.6 0.683 g/cm2 -8.0% Yes DualFemur Neck Right 09/20/2004 58.6 Osteopenia -2.1 0.742 g/cm2 - - ASSESSMENT: BMD as determined from Femur Neck Right is 0.683 g/cm2 with a T-Score of -2.6. This patient is considered osteoporotic according to [facility]) criteria. Compared with the prior study on 09/20/2004, the BMD of the (lumbar spine/rt femoral neck) show ( a statistically significant decrease). (L-4 was excluded due to advanced degenerative changes.)(Patient does not meet criteria for FRAX assessment.) World Health Organization [facility]) criteria for post-menopausal, Caucasian Women: Normal:        T-score at or above -1 SD Osteopenia:   T-score between -1 and -2.5 SD Osteoporosis: T-score at or below -2.5 SD RECOMMENDATIONS: [place] recommends that FDA-approved medial therapies be considered in postmenopausal women and men age 72 or older with a: 1. Hip or vertebral (clinical or morphometric) fracture. 2. T-Score of < -2.5 at the spine or hip. 3. Ten-year fracture probability by FRAX of 3% or greater for hip fracture or 20% or greater for major osteoporotic fracture.  All treatment decisions require clinical judgment and consideration of individual patient factors, including patient preferences, co-morbidities, previous drug use, risk factors not captured in the FRAX model (e.g. falls, vitamin D deficiency, increased bone turnover, interval significant decline in bone density) and possible under-or over-estimation of fracture risk by FRAX. All patients should ensure an adequate intake of dietary calcium (1200 mg/d) and vitamin D (800 IU daily) unless contraindicated. FOLLOW-UP: People with diagnosed cases of osteoporosis or osteopenia should be regularly tested for bone mineral density. For patients eligible for Medicare, routine testing is allowed once every 2 years. Testing frequency can be increased for patients who have rapidly progressing disease, or for those who are receiving medical therapy to restore bone mass. I have reviewed this report, and agree with the above findings. [facility] Electronically Signed   By: David  Martinique M.D.   On: 07/25/2015 11:00   Mm Diag Breast Tomo Uni Left  07/25/2015  CLINICAL DATA:  Follow-up for a probably benign left breast mass. EXAM: DIGITAL DIAGNOSTIC LEFT MAMMOGRAM WITH 3D TOMOSYNTHESIS AND CAD COMPARISON:  Previous exam(s). ACR Breast Density Category b: There are scattered areas of fibroglandular density. FINDINGS: No suspicious masses or calcifications are seen in the left breast. The probably benign 3 mm nodular  density in the slightly upper left breast is stable to slightly less apparent on today's exam. There are no mammographic findings of malignancy in the left breast. Mammographic images were processed with CAD. IMPRESSION: Unchanged probably benign left breast mass. RECOMMENDATION: Diagnostic mammography of the bilateral breasts in 6 months (with possible left breast ultrasound) to demonstrate stability of the probably benign left breast mass. I have discussed the findings and recommendations with the patient. Results were also provided in writing at the conclusion of the visit. If applicable, a reminder letter will be sent to the patient regarding the next appointment. BI-RADS CATEGORY  3: Probably benign. Electronically Signed   By: Everlean Alstrom M.D.   On: 07/25/2015 10:26   I have personally reviewed and evaluated these images and lab results as part of my medical decision-making.   EKG Interpretation   Date/Time:  Wednesday July 26 2015 15:36:50 EST Ventricular Rate:  65 PR Interval:  180 QRS Duration: 91 QT Interval:  383 QTC Calculation: 398 R Axis:   16 Text Interpretation:  Sinus rhythm Abnormal R-wave progression, early  transition Borderline T abnormalities, anterior leads since last tracing  no significant change Confirmed by Ceri Mayer  MD, [place] ([id number]) on 07/26/2015  4:19:46 PM      MDM   Final diagnoses:  Nonintractable headache, unspecified chronicity pattern, unspecified headache type  Essential hypertension    The patient is well-appearing, her vital signs though initially hypertensive have improved and her current blood pressure is 156/66. I do not see the need for acute blood pressure management, she will be given a single dose of anti-inflammatory for her headache and encouraged to follow-up closely. I have reviewed electronic medical record through care everywhere and review the MRI, EEG findings from [facility] which showed no acute findings on MRI. Nothing that  would suggest a source for her headache. I do not think she has a new subarachnoid hemorrhage, new mass, meningitis or other infectious etiology. I think she does appear anxious related to her blood pressure however is spontaneously improving and not in need of acute intervention.  Meds given in ED:  Medications  ketorolac (TORADOL) injection 60 mg (not administered)    New Prescriptions  No medications on file      Noemi Chapel, MD 07/26/15 1620

## 2015-07-26 NOTE — Progress Notes (Signed)
[name], female   DOB: Aug 12, 1945, 70 y.o.   MRN: [id number] [name], female   DOB: September 10, 1945, 70 y.o.   MRN: [id number] [name], female   DOB: 01-27-1946, 70 y.o.   MRN: [id number] [name], female   DOB: 11-17-1945, 70 y.o.   MRN: [id number] [name], female   DOB: [birth year], 70 y.o.   MRN: [id number] [name], female   DOB: Nov 16, 1945, 70 y.o.   MRN: [id number] [name], female   DOB: Mar 25, 1946, 70 y.o.   MRN: [id number] [name], female   DOB: Oct 26, 1945, 70 y.o.   MRN: [id number] [name], female   DOB: [birth year], 70 y.o.   MRN: [id number] [name], female   DOB: 02-Feb-1946, 70 y.o.   MRN: [id number] [name], female   DOB: [birth year], 70 y.o.   MRN: [id number] [name], female   DOB: 1945/12/12, 70 y.o.   MRN: [id number] [name], female   DOB: 20-Feb-1946, 70 y.o.   MRN: [id number] [name], female   DOB: 1945-08-12, 70 y.o.   MRN: [id number] [facility] [id number] Progress Note [name] MRN: [id number] DOB: [birth year] Age: 70 y.o.  Date: 07/26/2015 Start Time: 9:45 AM End Time: 10:10 AM  Chief Complaint: Chief Complaint  Patient presents with  . Depression  . Anxiety  . Follow-up    Subjective: "I' just got out of the hospital  This patient is a 70 year old black female who lives with her 59 year old daughter who has mild mental retardation. She lives in [place]. She is a retired [employer] manager. The patient states that she's been depressed "all my life.". She can't give any specific reasons for the depression but notes that at age 37 she was already taking medication to help with sleep. In her 41s her family doctor diagnosed with schizophrenia although she had no symptoms of paranoia or delusions or hallucinations. She was given  medication for this.  Since 1997 she's been coming here with symptoms of depression. She's also had significant problems with fibromyalgia and fatigue. In Neurontin and Ambien have helped. Lately however she's become more depressed and worried. She has had gallbladder surgery and she's concerned about how is going to go. For some reason she's been thinking a lot about death. She doesn't have anyone to stay with her daughter something goes wrong. She's been more negative but is  not suicidal but her energy has dropped. In general she is a very functional person  The patient returns after 4 months. She is accompanied by her sister. She was in [facility] most of last month. Her sister lives in [place] and brought her there after she noticed the patient wasn't making sense see more paranoid and confused and couldn't take care of her basic needs. She stayed in the medical part of the hospital for about 3 weeks and then was transferred to psychiatry. She had a very thorough neurological workup including brain CT MRI and lumbar puncture all of which were noncontributory. It was thought she had a UTI which was treated. She was taken off gabapentin and Ambien. She was tapered off Ativan and went home on no benzodiazepines except enough for 3 days. All she was in the hospital she was treated with Haldol but this was tapered off  as well and she is only been discharged on Viibryd and trazodone to which she is allergic.  The patient is seen after 2 weeks. She is here by herself. I have reviewed her [facility] nursing notes and she apparently has stopped several of her blood pressure medicines because "my sister told me to throw them away." Her blood pressures high today and will need to call Dr. Legrand Rams, her PCP about this. She states she is doing a little bit better and is sleeping fairly well but is having alternating constipation and diarrhea which is consistent with irritable bowel syndrome. She denies being  depressed or suicidal. However she claims prior to admission to [facility] she had the idea that someone was putting some "[employer] staff" in her yard that could've been poisonous. This was obviously delusional but she still thinks that it was real  Vitals: BP 170/98 mmHg  Pulse 67  Ht 5' 2" (1.575 m)  Wt 206 lb 3.2 oz (93.532 kg)  BMI 37.71 kg/m2  SpO2 97%  Psychiatric history Patient has a long history of depression.  She has been seeing in this office since May 1997.  Patient denies any history of suicidal attempt however endorse chronic depression and anxiety.  In the past she has taken Prozac but did not like after taking few doses, she also has taken Seroquel, Abilify, Xanax, Pamelor, and amitriptyline.  Patient denies a history of paranoia or delusions however there are times when she has been very depressed and have a lot of negative symptoms.  Alcohol and substance use history Patient denies any history of alcohol or substance use.  Allergies: Allergies  Allergen Reactions  . Elavil [Amitriptyline] Other (See Comments)    Felt really nervous and felt like something was hold her feet or arm and/ or AM headache on it and back on it and went away off it.   . Abilify [Aripiprazole] Other (See Comments)    Dystonic reaction  . Trazodone And Nefazodone Other (See Comments)    Brought back bad dreams of things in the past  . Codeine Hives, Nausea Only and Other (See Comments)    Feels funny  . Latex Hives  . Penicillin V Itching  . Penicillins Hives    Has patient had a PCN reaction causing immediate rash, facial/tongue/throat swelling, SOB or lightheadedness with hypotension: Yes Has patient had a PCN reaction causing severe rash involving mucus membranes or skin necrosis: No Has patient had a PCN reaction that required hospitalization No Has patient had a PCN reaction occurring within the last 10 years: No If all of the above answers are "NO", then may proceed with Cephalosporin use.    . Polyethylene Glycol Other (See Comments)    Per allergy test  . Sulfonamide Derivatives Nausea And Vomiting  . Cymbalta [Duloxetine Hcl] Rash  . Remeron [Mirtazapine] Other (See Comments)    Caused lots of strange, weird, crazy dreams.   Medical History: Past Medical History  Diagnosis Date  . Hypertension   . Hyperlipidemia   . Gastroesophageal reflux disease   . Diverticulosis   . Anxiety and depression   . Adenomatous colon polyp 2006    excised in 2006 & 2010Due surveillance 06/2013  . Chronic back pain   . Thyroid nodule   . Fibromyalgia   . Migraines   . Insomnia   . Chronic constipation   . Asthma   . History of benign esophageal tumor 2010    granular cell esophageal tumor (Dx 06/2008),  resected via EMR 2011, due repeat EGD 02/2012  . Chest discomfort   . Hiatal hernia   . Depression   . Anxiety   . Sleep apnea     Stop Bang score of 5. Pt said she was told by Dr. Merlene Laughter that she had "a little bit" of sleep apena, but not bad enough to treat.  . Breast tumor   . Complication of anesthesia   . PONV (postoperative nausea and vomiting)   . Tumor of esophagus   . Psychosis   Patient has history of hypertension, hyperlipidemia, GERD, diverticulosis, colonic polyp, chronic back pain, fibromyalgia, chronic constipation, benign tumor of esophagus and chronic headache.  Her primary care physician is Dr. Legrand Rams, she sees Dr. Paulita Cradle for chronic pain, fibromyalgia and headache. Her cardiologist is Dr Elvera Maria. Surgical History: Past Surgical History  Procedure Laterality Date  . Tonsillectomy    . Abdominal hysterectomy    . Total knee arthroplasty  2002    Right; previous arthroscopic surgery  . Shoulder arthroscopy      Right; bone spurs removed  . Lung biopsy      negative  . 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 Right oophorectomy      benign disease  . Colonoscopy  06/2008, 06/2011    sigmoid tics, tubular adenoma;  2013: anal canal hemorrhoids  . Eus  08/2010    Dr [facility] with EGD. Retained food. No recurrent esophageal lesion, bx negative.  . Colonoscopy  07/10/2011    Anal canal hemorrhoids likely the cause of hematochezia in the setting of constipation; otherwise normal rectum ;submucosal  petechiae in left colon of doubtful clinical significance; otherwise, normal colon  . Esophagogastroduodenoscopy  12/11/2011    [id number] Schatzki's ring; otherwise normal/Small hiatal hernia. Antral and bulbar erosions  . Bravo ph study  12/11/2011    Procedure: BRAVO [place] STUDY;  Surgeon: Daneil Dolin, MD;  [facility];  Service: Endoscopy;  Laterality: N/A;  . Cholecystectomy N/A 04/09/2013    Procedure: LAPAROSCOPIC CHOLECYSTECTOMY;  Surgeon: Jamesetta So, MD;  Location: [facility] ORS;  Service: General;  Laterality: N/A;  . Esophagogastroduodenoscopy (egd) with propofol N/A 05/08/2015    Dr. Gala Romney: mild erosive reflux esophagitis, non-critical Schatzki's ring s/p dilation. Hiatal hernia.   . Esophageal dilation N/A 05/08/2015    Procedure: ESOPHAGEAL DILATION;  Surgeon: Daneil Dolin, MD;  Location: [facility] ORS;  Service: Endoscopy;  Laterality: N/A;  [name] [age]   Family History: family history includes Alcohol abuse in her father; Anxiety disorder in her mother; Colon cancer in her paternal aunt and paternal uncle; Dementia in her maternal uncle; Depression in her mother; Heart attack in her mother; Stroke in her father. There is no history of ADD / ADHD, Bipolar disorder, Drug abuse, OCD, Paranoid behavior, Schizophrenia, Seizures, Sexual abuse, or Physical abuse. Reviewed and nothing new today again.  Mental status examination Patient is casually dressed and fairly groomed. She is calm cooperative and maintained fair eye contact. She described her mood as is improved but she is still little anxious She still has chronic pain  Her speech is coherent but slow.  Her thought processes clear and  logical. She denies suicidal ideation and no active or passive homicidal thoughts e. She denies any auditory or visual hallucination but has had some recent paranoid delusions such as "[employer] staff being put in her yard and a man that was following her. However she thinks  these things were in the past and they're no longer happening now. There no psychotic symptoms present at this time. She's alert and oriented x3. Her insight judgment impulse control is okay her fund of knowledge is fair and her language skills are good  Lab Results:  Results for orders placed or performed during the hospital encounter of 05/01/15 (from the past 8736 hour(s))  CBC with Differential/Platelet   Collection Time: 05/01/15  2:10 PM  Result Value Ref Range   WBC 4.6 4.0 - 10.5 K/uL   RBC 4.71 3.87 - 5.11 MIL/uL   Hemoglobin 12.5 12.0 - 15.0 g/dL   HCT 39.5 36.0 - 46.0 %   MCV 83.9 78.0 - 100.0 fL   MCH 26.5 26.0 - 34.0 pg   MCHC 31.6 30.0 - 36.0 g/dL   RDW 15.0 11.5 - 15.5 %   Platelets 188 150 - 400 K/uL   Neutrophils Relative % 53 %   Neutro Abs 2.4 1.7 - 7.7 K/uL   Lymphocytes Relative 39 %   Lymphs Abs 1.8 0.7 - 4.0 K/uL   Monocytes Relative 6 %   Monocytes Absolute 0.3 0.1 - 1.0 K/uL   Eosinophils Relative 2 %   Eosinophils Absolute 0.1 0.0 - 0.7 K/uL   Basophils Relative 0 %   Basophils Absolute 0.0 0.0 - 0.1 K/uL  Basic metabolic panel   Collection Time: 05/01/15  2:10 PM  Result Value Ref Range   Sodium 141 135 - 145 mmol/L   Potassium 3.9 3.5 - 5.1 mmol/L   Chloride 107 101 - 111 mmol/L   CO2 26 22 - 32 mmol/L   Glucose, Bld 95 65 - 99 mg/dL   BUN 10 6 - 20 mg/dL   Creatinine, Ser 1.13 (H) 0.44 - 1.00 mg/dL   Calcium 10.2 8.9 - 10.3 mg/dL   GFR calc non Af Amer 48 (L) >60 mL/min   GFR calc Af Amer 56 (L) >60 mL/min   Anion gap 8 5 - 15  Results for orders placed or performed in visit on 04/25/15 (from the past 8736 hour(s))  Clostridium Difficile by PCR   Collection Time: 05/03/15 11:17  AM  Result Value Ref Range   Toxigenic C Difficile by pcr Not Detected Not Detected  Results for orders placed or performed in visit on 09/29/14 (from the past 8736 hour(s))  Basic Metabolic Panel (BMET)   Collection Time: 10/29/14 10:32 AM  Result Value Ref Range   Sodium 141 135 - 145 mEq/L   Potassium 3.6 3.5 - 5.3 mEq/L   Chloride 104 96 - 112 mEq/L   CO2 28 19 - 32 mEq/L   Glucose, Bld 95 70 - 99 mg/dL   BUN 17 6 - 23 mg/dL   Creat 1.10 0.50 - 1.10 mg/dL   Calcium 10.2 8.4 - 10.5 mg/dL  Magnesium   Collection Time: 10/29/14 10:32 AM  Result Value Ref Range   Magnesium 2.1 1.5 - 2.5 mg/dL  Results for orders placed or performed in visit on 09/27/14 (from the past 8736 hour(s))  Lipid Profile   Collection Time: 09/27/14 11:29 AM  Result Value Ref Range   Cholesterol 173 0 - 200 mg/dL   Triglycerides 88 <150 mg/dL   HDL 61 >=46 mg/dL   Total CHOL/HDL Ratio 2.8 Ratio   VLDL 18 0 - 40 mg/dL   LDL Cholesterol 94 0 - 99 mg/dL  Basic Metabolic Panel (BMET)   Collection Time: 09/27/14 11:29 AM  Result Value Ref Range  Sodium 141 135 - 145 mEq/L   Potassium 3.8 3.5 - 5.3 mEq/L   Chloride 101 96 - 112 mEq/L   CO2 29 19 - 32 mEq/L   Glucose, Bld 94 70 - 99 mg/dL   BUN 15 6 - 23 mg/dL   Creat 1.05 0.50 - 1.10 mg/dL   Calcium 9.8 8.4 - 10.5 mg/dL  CBC with Differential   Collection Time: 09/27/14 11:29 AM  Result Value Ref Range   WBC 5.8 4.0 - 10.5 K/uL   RBC 4.92 3.87 - 5.11 MIL/uL   Hemoglobin 12.7 12.0 - 15.0 g/dL   HCT 40.0 36.0 - 46.0 %   MCV 81.3 78.0 - 100.0 fL   MCH 25.8 (L) 26.0 - 34.0 pg   MCHC 31.8 30.0 - 36.0 g/dL   RDW 15.8 (H) 11.5 - 15.5 %   Platelets 207 150 - 400 K/uL   MPV 9.5 8.6 - 12.4 fL   Neutrophils Relative % 55 43 - 77 %   Neutro Abs 3.2 1.7 - 7.7 K/uL   Lymphocytes Relative 34 12 - 46 %   Lymphs Abs 2.0 0.7 - 4.0 K/uL   Monocytes Relative 8 3 - 12 %   Monocytes Absolute 0.5 0.1 - 1.0 K/uL   Eosinophils Relative 2 0 - 5 %   Eosinophils  Absolute 0.1 0.0 - 0.7 K/uL   Basophils Relative 1 0 - 1 %   Basophils Absolute 0.1 0.0 - 0.1 K/uL   Smear Review Criteria for review not met   Magnesium   Collection Time: 09/27/14 11:29 AM  Result Value Ref Range   Magnesium 1.6 1.5 - 2.5 mg/dL   Review of systems is positive for bilateral knee pain  Assessment Axis I Maj. depressive disorder Axis II deferred Axis III see medical history Axis IV are to moderate.  Plan: I took her vitals.  I reviewed CC, tobacco/med/surg Hx, meds effects/ side effects, problem list, therapies and responses as well as current situation/symptoms discussed options. She will continue Viibryd 20 mg daily. She will continue clonazepam 1 mg 3 times a day to help with the anxiety and Seroquel 100 mg at bedtime to help with anxiety sleep and confusion/paranoia. She will return in 4 weeks. We will call her PCP about the blood pressure issues  See orders and pt instructions for more details.  MEDICATIONS this encounter: No orders of the defined types were placed in this encounter.    Medical Decision Making Problem Points:  Established problem, stable/improving (1), New problem, with additional work-up planned (4), Review of last therapy session (1) and Review of psycho-social stressors (1) Data Points:  Review or order clinical lab tests (1) Review of new medications or change in dosage (2)  I certify that outpatient services furnished can reasonably be expected to improve the patient's condition.   Levonne Spiller, MD

## 2015-07-26 NOTE — Telephone Encounter (Signed)
Called Dr Legrand Rams office due to provider asking staff to. Per provider to see if Dr. Legrand Rams office could call pt due to her stopping her medications because she informed her that her sister told her to stop taking most of there medications. Spoke with Tonia Ghent and she stated that Dr. Legrand Rams is out of the office for a week and she will try to make contact with pt to see if she could come into office to see the on call doctor instead.

## 2015-07-26 NOTE — Patient Instructions (Signed)
I have ordered blood work and a CT scan.  Please start taking Metamucil every day as directed on the bottle. If you feel constipated/bloated on any particular day, you may take 1 capful of Miralax.   In about 2 weeks, complete the stool sample and return to our office.   I will see you in 3 months!   You may need a colonoscopy before then if the stool sample is positive.

## 2015-07-26 NOTE — Progress Notes (Signed)
Referring Provider: Rosita Fire, MD Primary Care Physician:  Rosita Fire, MD  Primary GI: Dr. Gala Romney   Chief Complaint  Patient presents with  . Follow-up    HPI:   [name] is a 70 y.o. female presenting today with a remote history of granular cell tumor of the esophagus with most recent EGD in Nov 2016 with erosive reflux esophagitis, non-critical Schatzki's ring s/p dilation. Here for follow-up. Historically has been constipated and was having loose stools in Nov 2016 at office visit. Cdiff PCR negative.   Was recently inpatient at [facility] for a prolonged course in Dec 2016, stating she "lost her memory". Has "awful" feeling in her stomach. At first was having diarrhea 4-5 times per day. Started taking imodium without any improvement. Kaopectate without improvement. Now has constipation again. Hard to have a BM. Afraid that if she takes something, that she will start having diarrhea again. States last night she had stool all over her bed. Lower abdominal discomfort, feels like a rake that has "raked" the inside. Laying on left side hurts more. Stays cold all the time. Had 2 BMs Sunday (3 days ago). Saturday was going "all day long". Amitiza felt like it gave her a stomach ache where the Linzess didn't. Also notes RUQ discomfort that feels like it was before her cholecystectomy. Waxes and wanes but "bad when it comes". No precipitating or relieving factors. If bending over it worsens.   Past Medical History  Diagnosis Date  . Hypertension   . Hyperlipidemia   . Gastroesophageal reflux disease   . Diverticulosis   . Anxiety and depression   . Adenomatous colon polyp 2006    excised in 2006 & 2010Due surveillance 06/2013  . Chronic back pain   . Thyroid nodule   . Fibromyalgia   . Migraines   . Insomnia   . Chronic constipation   . Asthma   . History of benign esophageal tumor 2010    granular cell esophageal tumor (Dx 06/2008), resected via EMR 2011, due repeat EGD 02/2012    . Chest discomfort   . Hiatal hernia   . Depression   . Anxiety   . Sleep apnea     Stop Bang score of 5. Pt said she was told by Dr. Merlene Laughter that she had "a little bit" of sleep apena, but not bad enough to treat.  . Breast tumor   . Complication of anesthesia   . PONV (postoperative nausea and vomiting)   . Tumor of esophagus   . Psychosis     Past Surgical History  Procedure Laterality Date  . Tonsillectomy    . Abdominal hysterectomy    . Total knee arthroplasty  2002    Right; previous arthroscopic surgery  . Shoulder arthroscopy      Right; bone spurs removed  . Lung biopsy      negative  . 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 Right oophorectomy      benign disease  . Colonoscopy  06/2008, 06/2011    sigmoid tics, tubular adenoma; 2013: anal canal hemorrhoids  . Eus  08/2010    Dr [facility] with EGD. Retained food. No recurrent esophageal lesion, bx negative.  . Colonoscopy  07/10/2011    Anal canal hemorrhoids likely the cause of hematochezia in the setting of constipation; otherwise normal rectum ;submucosal  petechiae in left colon of doubtful clinical significance; otherwise, normal colon  . Esophagogastroduodenoscopy  12/11/2011  [id number] Schatzki's ring; otherwise normal/Small hiatal hernia. Antral and bulbar erosions  . Bravo ph study  12/11/2011    Procedure: BRAVO [place] STUDY;  Surgeon: Daneil Dolin, MD;  Location: [facility];  Service: Endoscopy;  Laterality: N/A;  . Cholecystectomy N/A 04/09/2013    Procedure: LAPAROSCOPIC CHOLECYSTECTOMY;  Surgeon: Jamesetta So, MD;  Location: [facility] ORS;  Service: General;  Laterality: N/A;  . Esophagogastroduodenoscopy (egd) with propofol N/A 05/08/2015    Dr. Gala Romney: mild erosive reflux esophagitis, non-critical Schatzki's ring s/p dilation. Hiatal hernia.   . Esophageal dilation N/A 05/08/2015    Procedure: ESOPHAGEAL DILATION;  Surgeon: Daneil Dolin, MD;   Location: [facility] ORS;  Service: Endoscopy;  Laterality: N/ANathaniel Man    Current Outpatient Prescriptions  Medication Sig Dispense Refill  . acetaminophen (TYLENOL) 500 MG tablet Take 500 mg by mouth every 8 (eight) hours as needed for mild pain or moderate pain.    Marland Kitchen albuterol (PROVENTIL HFA;VENTOLIN HFA) 108 (90 BASE) MCG/ACT inhaler Inhale 2 puffs into the lungs every 6 (six) hours as needed. For shortness of breath    . atenolol (TENORMIN) 50 MG tablet Take 1 tablet (50 mg total) by mouth 2 (two) times daily. 180 tablet 2  . cholecalciferol (VITAMIN D) 1000 units tablet Take 1,000 Units by mouth daily.    . clonazePAM (KLONOPIN) 1 MG tablet Take 1 tablet (1 mg total) by mouth 3 (three) times daily. 90 tablet 2  . fluticasone (FLONASE) 50 MCG/ACT nasal spray Place 1 spray into both nostrils 2 (two) times daily.    Marland Kitchen lisinopril-hydrochlorothiazide (PRINZIDE,ZESTORETIC) 20-25 MG tablet Take 1 tablet by mouth daily. 90 tablet 3  . Loperamide-Simethicone (IMODIUM ADVANCED) 2-125 MG TABS Take 1 tablet by mouth daily.    . potassium chloride SA (K-DUR,KLOR-CON) 20 MEQ tablet TAKE ONE (1) TABLET EACH DAY 180 tablet 1  . QUEtiapine (SEROQUEL) 100 MG tablet Take 1 tablet (100 mg total) by mouth at bedtime. 30 tablet 2  . Vilazodone HCl (VIIBRYD) 20 MG TABS Take 1 tablet (20 mg total) by mouth daily. 30 tablet 2   No current facility-administered medications for this visit.    Allergies as of 07/26/2015 - Review Complete 07/26/2015  Allergen Reaction Noted  . Elavil [amitriptyline] Other (See Comments) 06/03/2012  . Abilify [aripiprazole] Other (See Comments) 11/25/2012  . Trazodone and nefazodone Other (See Comments) 08/31/2012  . Codeine Hives, Nausea Only, and Other (See Comments)   . Latex Hives 11/21/2011  . Penicillin v Itching 08/02/2013  . Penicillins Hives   . Polyethylene glycol Other (See Comments) 12/26/2012  . Sulfonamide derivatives Nausea And Vomiting   . Cymbalta [duloxetine  hcl] Rash 03/10/2013  . Remeron [mirtazapine] Other (See Comments) 05/06/2012    Family History  Problem Relation Age of Onset  . Stroke Father   . Alcohol abuse Father   . Heart attack Mother   . Depression Mother   . Anxiety disorder Mother   . Colon cancer Paternal Aunt   . Colon cancer Paternal Uncle   . Dementia Maternal Uncle   . ADD / ADHD Neg Hx   . Bipolar disorder Neg Hx   . Drug abuse Neg Hx   . OCD Neg Hx   . Paranoid behavior Neg Hx   . Schizophrenia Neg Hx   . Seizures Neg Hx   . Sexual abuse Neg Hx   . Physical abuse Neg Hx     Social History   Social History  .  Marital Status: Single    Spouse Name: N/A  . Number of Children: 1  . Years of Education: N/A   Occupational History  . disabled    Social History Main Topics  . Smoking status: Never Smoker   . Smokeless tobacco: Never Used  . Alcohol Use: No  . Drug Use: No  . Sexual Activity: No   Other Topics Concern  . None   Social History Narrative    Review of Systems: As mentioned in HPI   Physical Exam: BP 174/84 mmHg  Pulse 57  Temp(Src) 97.3 F (36.3 C)  Ht 5\' 2"  (1.575 m)  Wt 208 lb 6.4 oz (94.53 kg)  BMI 38.11 kg/m2 General:   Alert and oriented. No distress noted. Pleasant and cooperative.  Head:  Normocephalic and atraumatic. Eyes:  Conjuctiva clear without scleral icterus. Mouth:  Oral mucosa pink and moist.  Heart:  S1, S2 present without murmurs, rubs, or gallops. Regular rate and rhythm. Abdomen:  +BS, soft, mild TTP LLQ and non-distended. No rebound or guarding. No HSM or masses noted. Msk:  Symmetrical without gross deformities. Normal posture.  Extremities:  Without edema. Neurologic:  Alert and  oriented x4;  grossly normal neurologically. Psych:  Alert and cooperative. Normal mood and affect.

## 2015-07-26 NOTE — Progress Notes (Signed)
cc'ed to pcp °

## 2015-07-26 NOTE — Assessment & Plan Note (Signed)
70 year old female with lower abdominal pain likely secondary to IBS and RUQ pain of unclear etiology. Change in bowel habits from chronically constipated to alternating constipation and loose stool/incontinence. Query multifactorial but with last colonoscopy in 2013 and history of adenomas, may need to consider early interval colonoscopy. Will trial adding supplemental fiber, Miralax on any given day of constipation, and check ifobt. If positive, proceed with colonoscopy. Will obtain CT scan now as last imaging nearly 3 years ago. CMP also ordered due to vague RUQ pain, and gallbladder is absent. EGD up-to-date. 3 month return.

## 2015-07-26 NOTE — ED Notes (Signed)
Pt reports being sent over by Dr. Josephine Cables office for evaluation due to elevated blood pressure. Pt also reports a headache that began today. Denies vision changes. Denies numbness or weakness.

## 2015-07-28 ENCOUNTER — Telehealth: Payer: Self-pay | Admitting: Cardiology

## 2015-07-28 NOTE — Telephone Encounter (Signed)
Will forward to dr Branch 

## 2015-07-28 NOTE — Telephone Encounter (Signed)
From our records she is on lisionpril as a combination pill with HCTZ. Can stop that pill and chart losartan 25mg  daily and also will need to start HCTZ as a separate pill at 25mg  daily   Zandra Abts MD

## 2015-07-28 NOTE — Telephone Encounter (Signed)
Pt called and left message stating that her Lisinopril is making her cough and is wondering if she can be changed to something different

## 2015-07-31 ENCOUNTER — Ambulatory Visit: Payer: Self-pay | Admitting: Cardiology

## 2015-07-31 MED ORDER — LOSARTAN POTASSIUM 25 MG PO TABS: 25.0000 mg | ORAL_TABLET | Freq: Every day | ORAL | Status: DC

## 2015-07-31 MED ORDER — HYDROCHLOROTHIAZIDE 25 MG PO TABS: 25.0000 mg | ORAL_TABLET | Freq: Every day | ORAL | Status: DC

## 2015-07-31 NOTE — Telephone Encounter (Signed)
Pt notified of med change,escribed to [facility]  pharmacy

## 2015-08-01 ENCOUNTER — Ambulatory Visit (HOSPITAL_COMMUNITY)
Admission: RE | Admit: 2015-08-01 | Discharge: 2015-08-01 | Disposition: A | Discharge status: Home / Self Care | Payer: Medicare Other | Source: Ambulatory Visit | Attending: Gastroenterology | Admitting: Gastroenterology

## 2015-08-01 DIAGNOSIS — R109 Unspecified abdominal pain: Secondary | ICD-10-CM | POA: Diagnosis not present

## 2015-08-01 DIAGNOSIS — K5900 Constipation, unspecified: Secondary | ICD-10-CM | POA: Diagnosis not present

## 2015-08-01 DIAGNOSIS — D1771 Benign lipomatous neoplasm of kidney: Secondary | ICD-10-CM | POA: Diagnosis not present

## 2015-08-01 DIAGNOSIS — R197 Diarrhea, unspecified: Secondary | ICD-10-CM | POA: Insufficient documentation

## 2015-08-01 DIAGNOSIS — R1013 Epigastric pain: Secondary | ICD-10-CM | POA: Insufficient documentation

## 2015-08-01 MED ORDER — IOHEXOL 300 MG/ML  SOLN
100.0000 mL | Freq: Once | INTRAMUSCULAR | Status: AC | PRN
  Administered 2015-08-01: 100 mL via INTRAVENOUS

## 2015-08-03 ENCOUNTER — Ambulatory Visit: Payer: Self-pay | Admitting: Cardiology

## 2015-08-03 DIAGNOSIS — E559 Vitamin D deficiency, unspecified: Secondary | ICD-10-CM | POA: Diagnosis not present

## 2015-08-03 DIAGNOSIS — E210 Primary hyperparathyroidism: Secondary | ICD-10-CM | POA: Diagnosis not present

## 2015-08-03 DIAGNOSIS — E213 Hyperparathyroidism, unspecified: Secondary | ICD-10-CM | POA: Diagnosis not present

## 2015-08-08 NOTE — Progress Notes (Signed)
Quick Note:  Cr appears stable at baseline. LFTs normal. CT unimpressive. Needs to return ifobt in next few weeks. ______

## 2015-08-11 ENCOUNTER — Encounter: Payer: Self-pay | Admitting: *Deleted

## 2015-08-11 ENCOUNTER — Other Ambulatory Visit: Payer: Self-pay | Admitting: *Deleted

## 2015-08-11 DIAGNOSIS — I10 Essential (primary) hypertension: Secondary | ICD-10-CM

## 2015-08-11 DIAGNOSIS — F32A Depression, unspecified: Secondary | ICD-10-CM

## 2015-08-11 DIAGNOSIS — F329 Major depressive disorder, single episode, unspecified: Secondary | ICD-10-CM

## 2015-08-11 NOTE — Patient Outreach (Signed)
Call to patient to follow up on last Month's visit  Subjective: Patient stating she is doing pretty good. She has had one of her bone tests at [facility], still needs another before any treatments or procedures. She reports they stopped lisinopril due to cough. Patient checking BP but not documenting-encouraged her to document as she says "it goes up and down". Patient reporting she has switched to [place].  Assessment: HTN-medication changes. Instructed patient to document BP readings Depression-managed by Dr. Harrington Challenger and medication Medication management-using medication alarm, wants it to alarm 3 times a day  Plan: [facility] CM Care Plan Problem One        Most Recent Value   Care Plan Problem One  Medication management as evidenced by medication non adherence   Role Documenting the Problem One  Care Management [place] for Problem One  Active   [facility] Long Term Goal (31-90 days)  Patient will be able to self manage medications over the next 60 days   [facility] Long Term Goal Start Date  07/14/15   Interventions for Problem One Long Term Goal  medication reconciliation. RNCM will let Dawn know about medication alarm     Will let [facility] pharmacist know about medication alarm. [facility] case, refer to telephonic care coordinator  Royetta Crochet. Laymond Purser, RN, BSN, [place] [phone]

## 2015-08-14 DIAGNOSIS — R946 Abnormal results of thyroid function studies: Secondary | ICD-10-CM | POA: Diagnosis not present

## 2015-08-14 DIAGNOSIS — E213 Hyperparathyroidism, unspecified: Secondary | ICD-10-CM | POA: Diagnosis not present

## 2015-08-14 DIAGNOSIS — E210 Primary hyperparathyroidism: Secondary | ICD-10-CM | POA: Diagnosis not present

## 2015-08-16 DIAGNOSIS — G43719 Chronic migraine without aura, intractable, without status migrainosus: Secondary | ICD-10-CM | POA: Diagnosis not present

## 2015-08-16 DIAGNOSIS — G3184 Mild cognitive impairment, so stated: Secondary | ICD-10-CM | POA: Diagnosis not present

## 2015-08-16 DIAGNOSIS — G894 Chronic pain syndrome: Secondary | ICD-10-CM | POA: Diagnosis not present

## 2015-08-16 DIAGNOSIS — Z79899 Other long term (current) drug therapy: Secondary | ICD-10-CM | POA: Diagnosis not present

## 2015-08-16 DIAGNOSIS — E559 Vitamin D deficiency, unspecified: Secondary | ICD-10-CM | POA: Diagnosis not present

## 2015-08-21 ENCOUNTER — Ambulatory Visit: Payer: Self-pay | Admitting: Cardiology

## 2015-08-22 ENCOUNTER — Emergency Department (HOSPITAL_COMMUNITY)
Admission: EM | Admit: 2015-08-22 | Discharge: 2015-08-22 | Disposition: A | Discharge status: Home / Self Care | Payer: Medicare Other | Attending: Emergency Medicine | Admitting: Emergency Medicine

## 2015-08-22 ENCOUNTER — Encounter (HOSPITAL_COMMUNITY): Payer: Self-pay

## 2015-08-22 ENCOUNTER — Ambulatory Visit (INDEPENDENT_AMBULATORY_CARE_PROVIDER_SITE_OTHER): Payer: Medicare Other | Admitting: Cardiology

## 2015-08-22 ENCOUNTER — Emergency Department (HOSPITAL_COMMUNITY): Payer: Medicare Other

## 2015-08-22 ENCOUNTER — Encounter: Payer: Self-pay | Admitting: Cardiology

## 2015-08-22 VITALS — BP 158/90 | HR 75 | Ht 62.0 in | Wt 205.0 lb

## 2015-08-22 DIAGNOSIS — Z9049 Acquired absence of other specified parts of digestive tract: Secondary | ICD-10-CM | POA: Diagnosis not present

## 2015-08-22 DIAGNOSIS — S62346A Nondisplaced fracture of base of fifth metacarpal bone, right hand, initial encounter for closed fracture: Secondary | ICD-10-CM | POA: Insufficient documentation

## 2015-08-22 DIAGNOSIS — M545 Low back pain: Secondary | ICD-10-CM | POA: Diagnosis not present

## 2015-08-22 DIAGNOSIS — Y92531 Health care provider office as the place of occurrence of the external cause: Secondary | ICD-10-CM | POA: Insufficient documentation

## 2015-08-22 DIAGNOSIS — S62315A Displaced fracture of base of fourth metacarpal bone, left hand, initial encounter for closed fracture: Secondary | ICD-10-CM | POA: Diagnosis not present

## 2015-08-22 DIAGNOSIS — I10 Essential (primary) hypertension: Secondary | ICD-10-CM

## 2015-08-22 DIAGNOSIS — Z8603 Personal history of neoplasm of uncertain behavior: Secondary | ICD-10-CM | POA: Insufficient documentation

## 2015-08-22 DIAGNOSIS — Z91040 Latex allergy status: Secondary | ICD-10-CM | POA: Insufficient documentation

## 2015-08-22 DIAGNOSIS — F418 Other specified anxiety disorders: Secondary | ICD-10-CM | POA: Diagnosis not present

## 2015-08-22 DIAGNOSIS — Y9301 Activity, walking, marching and hiking: Secondary | ICD-10-CM | POA: Diagnosis not present

## 2015-08-22 DIAGNOSIS — F329 Major depressive disorder, single episode, unspecified: Secondary | ICD-10-CM | POA: Diagnosis not present

## 2015-08-22 DIAGNOSIS — Z8719 Personal history of other diseases of the digestive system: Secondary | ICD-10-CM | POA: Diagnosis not present

## 2015-08-22 DIAGNOSIS — J45909 Unspecified asthma, uncomplicated: Secondary | ICD-10-CM | POA: Insufficient documentation

## 2015-08-22 DIAGNOSIS — Y999 Unspecified external cause status: Secondary | ICD-10-CM | POA: Diagnosis not present

## 2015-08-22 DIAGNOSIS — E785 Hyperlipidemia, unspecified: Secondary | ICD-10-CM | POA: Insufficient documentation

## 2015-08-22 DIAGNOSIS — S6991XA Unspecified injury of right wrist, hand and finger(s), initial encounter: Secondary | ICD-10-CM | POA: Diagnosis present

## 2015-08-22 DIAGNOSIS — W1830XA Fall on same level, unspecified, initial encounter: Secondary | ICD-10-CM | POA: Diagnosis not present

## 2015-08-22 DIAGNOSIS — I5032 Chronic diastolic (congestive) heart failure: Secondary | ICD-10-CM | POA: Diagnosis not present

## 2015-08-22 DIAGNOSIS — R42 Dizziness and giddiness: Secondary | ICD-10-CM

## 2015-08-22 DIAGNOSIS — S62309A Unspecified fracture of unspecified metacarpal bone, initial encounter for closed fracture: Secondary | ICD-10-CM

## 2015-08-22 DIAGNOSIS — Z90710 Acquired absence of both cervix and uterus: Secondary | ICD-10-CM | POA: Diagnosis not present

## 2015-08-22 DIAGNOSIS — Z79899 Other long term (current) drug therapy: Secondary | ICD-10-CM | POA: Insufficient documentation

## 2015-08-22 DIAGNOSIS — R600 Localized edema: Secondary | ICD-10-CM | POA: Diagnosis not present

## 2015-08-22 DIAGNOSIS — S62314A Displaced fracture of base of fourth metacarpal bone, right hand, initial encounter for closed fracture: Secondary | ICD-10-CM | POA: Insufficient documentation

## 2015-08-22 DIAGNOSIS — Z880 Allergy status to penicillin: Secondary | ICD-10-CM | POA: Diagnosis not present

## 2015-08-22 DIAGNOSIS — S62361A Nondisplaced fracture of neck of second metacarpal bone, left hand, initial encounter for closed fracture: Secondary | ICD-10-CM | POA: Diagnosis not present

## 2015-08-22 MED ORDER — ACETAMINOPHEN 325 MG PO TABS
650.0000 mg | ORAL_TABLET | Freq: Once | ORAL | Status: AC
  Administered 2015-08-22: 650 mg via ORAL
  Filled 2015-08-22: qty 2

## 2015-08-22 MED ORDER — HYDROCODONE-ACETAMINOPHEN 5-325 MG PO TABS: 1.0000 | ORAL_TABLET | ORAL | Status: DC | PRN

## 2015-08-22 MED ORDER — FUROSEMIDE 40 MG PO TABS: ORAL_TABLET | ORAL | Status: DC

## 2015-08-22 NOTE — Progress Notes (Signed)
[name], female   DOB: August 29, 1945, 70 y.o.   MRN: [id number]     Clinical Summary [name] is a 70 y.o.female seen today for follow up of the following medical probolems.   1. Lightheadedness/Dizziness  - started approx 1 year ago. Feeling of lightheadedness, but at times can feel like the room is spinning. Can happen laying down, sitting down, or standing. No association w/ exertion.Sometimes brought on by rolling over in bed. - event monitor shows just mild sinus brady which is her baseline  - she was orthostatic at last visit. We stopped her terazosin and encouraged increased oral intake which she has done. We also decreased her atenolol to 50mg  bid, she had been mistakingly taking 50mg  in AM and 100mg  in pm - since last visit continues to have dizziness. 2 falling episodes but both were mechnical.   2. Chronic diastolic heart failure - echo 08/2014 LVEF 60-65%, abnormal diastolic function indeterminate grade.  - bilateral LE edema, started around December - not improved with HCTZ started by her pcp  3. HTN - lisinopril changed recently due to cough to losartan - seen in ER 07/26/15 with HTN and headache, BP 179/87 - reports med compliance  4. Falls - fall walking in from parking lot. She reports she tripped over the side walk edge. No lightheadness, no dizziness, no syncope.    Past Medical History  Diagnosis Date  . Hypertension   . Hyperlipidemia   . Gastroesophageal reflux disease   . Diverticulosis   . Anxiety and depression   . Adenomatous colon polyp 2006    excised in 2006 & 2010Due surveillance 06/2013  . Chronic back pain   . Thyroid nodule   . Fibromyalgia   . Migraines   . Insomnia   . Chronic constipation   . Asthma   . History of benign esophageal tumor 2010    granular cell esophageal tumor (Dx 06/2008), resected via EMR 2011, due repeat EGD 02/2012  . Chest discomfort   . Hiatal hernia   . Depression   . Anxiety   . Sleep apnea    Stop Bang score of 5. Pt said she was told by Dr. Merlene Laughter that she had "a little bit" of sleep apena, but not bad enough to treat.  . Breast tumor   . Complication of anesthesia   . PONV (postoperative nausea and vomiting)   . Tumor of esophagus   . Psychosis      Allergies  Allergen Reactions  . Elavil [Amitriptyline] Other (See Comments)    Felt really nervous and felt like something was hold her feet or arm and/ or AM headache on it and back on it and went away off it.   . Abilify [Aripiprazole] Other (See Comments)    Dystonic reaction  . Trazodone And Nefazodone Other (See Comments)    Brought back bad dreams of things in the past  . Codeine Hives, Nausea Only and Other (See Comments)    Feels funny  . Latex Hives  . Penicillin V Itching  . Penicillins Hives    Has patient had a PCN reaction causing immediate rash, facial/tongue/throat swelling, SOB or lightheadedness with hypotension: Yes Has patient had a PCN reaction causing severe rash involving mucus membranes or skin necrosis: No Has patient had a PCN reaction that required hospitalization No Has patient had a PCN reaction occurring within the last 10 years: No If all of the above answers are "NO", then may proceed  with Cephalosporin use.   . Polyethylene Glycol Other (See Comments)    Per allergy test  . Sulfonamide Derivatives Nausea And Vomiting  . Cymbalta [Duloxetine Hcl] Rash  . Remeron [Mirtazapine] Other (See Comments)    Caused lots of strange, weird, crazy dreams.     Current Outpatient Prescriptions  Medication Sig Dispense Refill  . acetaminophen (TYLENOL) 500 MG tablet Take 500 mg by mouth every 8 (eight) hours as needed for mild pain or moderate pain.    Marland Kitchen albuterol (PROVENTIL HFA;VENTOLIN HFA) 108 (90 BASE) MCG/ACT inhaler Inhale 2 puffs into the lungs every 6 (six) hours as needed. For shortness of breath    . atenolol (TENORMIN) 50 MG tablet Take 1 tablet (50 mg total) by mouth 2 (two) times  daily. 180 tablet 2  . cholecalciferol (VITAMIN D) 1000 units tablet Take 1,000 Units by mouth daily.    . clonazePAM (KLONOPIN) 1 MG tablet Take 1 tablet (1 mg total) by mouth 3 (three) times daily. 90 tablet 2  . fluticasone (FLONASE) 50 MCG/ACT nasal spray Place 1 spray into both nostrils 2 (two) times daily.    . hydrochlorothiazide (HYDRODIURIL) 25 MG tablet Take 1 tablet (25 mg total) by mouth daily. 90 tablet 3  . losartan (COZAAR) 25 MG tablet Take 1 tablet (25 mg total) by mouth daily. 90 tablet 3  . potassium chloride SA (K-DUR,KLOR-CON) 20 MEQ tablet TAKE ONE (1) TABLET EACH DAY 180 tablet 1  . QUEtiapine (SEROQUEL) 100 MG tablet Take 1 tablet (100 mg total) by mouth at bedtime. 30 tablet 2  . Vilazodone HCl (VIIBRYD) 20 MG TABS Take 1 tablet (20 mg total) by mouth daily. 30 tablet 2   No current facility-administered medications for this visit.     Past Surgical History  Procedure Laterality Date  . Tonsillectomy    . Abdominal hysterectomy    . Total knee arthroplasty  2002    Right; previous arthroscopic surgery  . Shoulder arthroscopy      Right; bone spurs removed  . Lung biopsy      negative  . 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 Right oophorectomy      benign disease  . Colonoscopy  06/2008, 06/2011    sigmoid tics, tubular adenoma; 2013: anal canal hemorrhoids  . Eus  08/2010    Dr [facility] with EGD. Retained food. No recurrent esophageal lesion, bx negative.  . Colonoscopy  07/10/2011    Anal canal hemorrhoids likely the cause of hematochezia in the setting of constipation; otherwise normal rectum ;submucosal  petechiae in left colon of doubtful clinical significance; otherwise, normal colon  . Esophagogastroduodenoscopy  12/11/2011    [id number] Schatzki's ring; otherwise normal/Small hiatal hernia. Antral and bulbar erosions  . Bravo ph study  12/11/2011    Procedure: BRAVO [place] STUDY;  Surgeon: Daneil Dolin, MD;  Location: [facility] ENDO SUITE;  Service: Endoscopy;  Laterality: N/A;  . Cholecystectomy N/A 04/09/2013    Procedure: LAPAROSCOPIC CHOLECYSTECTOMY;  Surgeon: Jamesetta So, MD;  Location: [facility] ORS;  Service: General;  Laterality: N/A;  . Esophagogastroduodenoscopy (egd) with propofol N/A 05/08/2015    Dr. Gala Romney: mild erosive reflux esophagitis, non-critical Schatzki's ring s/p dilation. Hiatal hernia.   . Esophageal dilation N/A 05/08/2015    Procedure: ESOPHAGEAL DILATION;  Surgeon: Daneil Dolin, MD;  Location: [facility] ORS;  Service: Endoscopy;  Laterality: N/AVenia Minks 54/56     Allergies  Allergen  Reactions  . Elavil [Amitriptyline] Other (See Comments)    Felt really nervous and felt like something was hold her feet or arm and/ or AM headache on it and back on it and went away off it.   . Abilify [Aripiprazole] Other (See Comments)    Dystonic reaction  . Trazodone And Nefazodone Other (See Comments)    Brought back bad dreams of things in the past  . Codeine Hives, Nausea Only and Other (See Comments)    Feels funny  . Latex Hives  . Penicillin V Itching  . Penicillins Hives    Has patient had a PCN reaction causing immediate rash, facial/tongue/throat swelling, SOB or lightheadedness with hypotension: Yes Has patient had a PCN reaction causing severe rash involving mucus membranes or skin necrosis: No Has patient had a PCN reaction that required hospitalization No Has patient had a PCN reaction occurring within the last 10 years: No If all of the above answers are "NO", then may proceed with Cephalosporin use.   . Polyethylene Glycol Other (See Comments)    Per allergy test  . Sulfonamide Derivatives Nausea And Vomiting  . Cymbalta [Duloxetine Hcl] Rash  . Remeron [Mirtazapine] Other (See Comments)    Caused lots of strange, weird, crazy dreams.      Family History  Problem Relation Age of Onset  . Stroke Father   . Alcohol abuse Father   . Heart attack Mother   .  Depression Mother   . Anxiety disorder Mother   . Colon cancer Paternal Aunt   . Colon cancer Paternal Uncle   . Dementia Maternal Uncle   . ADD / ADHD Neg Hx   . Bipolar disorder Neg Hx   . Drug abuse Neg Hx   . OCD Neg Hx   . Paranoid behavior Neg Hx   . Schizophrenia Neg Hx   . Seizures Neg Hx   . Sexual abuse Neg Hx   . Physical abuse Neg Hx      Social History [name] reports that she has never smoked. She has never used smokeless tobacco. [name] reports that she does not drink alcohol.   Review of Systems CONSTITUTIONAL: No weight loss, fever, chills, weakness or fatigue.  HEENT: Eyes: No visual loss, blurred vision, double vision or yellow sclerae.No hearing loss, sneezing, congestion, runny nose or sore throat.  SKIN: No rash or itching.  CARDIOVASCULAR: per HPI RESPIRATORY: No shortness of breath, cough or sputum.  GASTROINTESTINAL: No anorexia, nausea, vomiting or diarrhea. No abdominal pain or blood.  GENITOURINARY: No burning on urination, no polyuria NEUROLOGICAL: +dizziness  MUSCULOSKELETAL: No muscle, back pain, joint pain or stiffness.  LYMPHATICS: No enlarged nodes. No history of splenectomy.  PSYCHIATRIC: No history of depression or anxiety.  ENDOCRINOLOGIC: No reports of sweating, cold or heat intolerance. No polyuria or polydipsia.  Marland Kitchen   Physical Examination Filed Vitals:   08/22/15 0832  BP: 158/90  Pulse: 75   Filed Vitals:   08/22/15 0832  Height: 5\' 2"  (1.575 m)  Weight: 205 lb (92.987 kg)    Gen: resting comfortably, no acute distress HEENT: no scleral icterus, pupils equal round and reactive, no palptable cervical adenopathy,  CV: RRR, no m/r/g, no jvd Resp: Clear to auscultation bilaterally GI: abdomen is soft, non-tender, non-distended, normal bowel sounds, no hepatosplenomegaly MSK: extremities are warm, no edema.  Skin: warm, no rash Neuro:  no focal deficits Psych: appropriate affect   Diagnostic Studies 01/21/13 EKG:  sinus brady rate 54,  no block, no ischemic changes   02/17/13 Event monitor: baseline sinus brady in 50s, symptoms of dizziness correlate w/ sinus brady in 50s. Occasional PVCs, 1 PVC couplet.   01/2012 Carotid US: minimal plaque at bifurcations, no stenosis   Pertinent labs 12/2012: Na 142 K 3.7 Cl 112 Cr 0.8   10/2014 echo Study Conclusions  - Left ventricle: The cavity size was normal. Wall thickness was increased in a pattern of mild LVH. Systolic function was normal. The estimated ejection fraction was in the range of 60% to 65%. Abnormal diastoilc function, indeterminate grade. - Aortic valve: There was mild regurgitation. Valve area (VTI): 2.48 cm^2. Valve area (Vmax): 2.89 cm^2. - Technically adequate study.  Assessment and Plan  1. Dizziness  - symptoms most suggestive of vertigo, as they are positional and often brought on with changing positions while laying down, feeling of room spinning - she is no longer orthostatic. Bradycardia resolved with decreased atenolol. Prior monitor showed no arrhythmias. - vertigo management per pcp   2. Chronic diastolic HF - edema not controlled with HCTZ, will start lasix 40mg  prn  3. HTN - elevated in clinic, she will keep bp log x 7 day and submit. Pending numbers consider titration of bp meds   F/u 1 month      Arnoldo Lenis, M.D.

## 2015-08-22 NOTE — Discharge Instructions (Signed)
Right your x-ray reveals a fracture at the base of your little finger and ring finger just above your wrist on the right hand. It is important that you see the hand specialist listed above concerning these fractures. Please keep the hand elevated as much as possible. Apply ice when possible. Use Tylenol for mild pain, use Norco for more severe pain. Please take Norco with food. Metacarpal Fracture Fractures of metacarpals are breaks in the bones of the hand. They extend from the knuckles to the wrist. These bones can break in many ways. There are different ways of treating these fractures. HOME CARE  Only exercise as told by your doctor.  Return to activities as told by your doctor.  Go to physical therapy as told by your doctor.  Follow your doctor's advice about driving.  Keep the injured hand raised (elevated) above the level of your heart.  If a plaster, fiberglass, or pre-formed splint was applied:  Wear your splint as told and until you are examined again.  Apply ice on the injury for 15-20 minutes at a time, 03-04 times a day. Put the ice in a plastic bag. Place a towel between your skin and the bag.  Do not get your splint or cast wet. Protect it during bathing with a plastic bag.  Loosen the elastic bandage around the splint if your fingers start to get numb, tingle, get cold, or turn blue.  If the splint is plaster, do not lean it on hard surfaces or put pressure on it for 24 hours after it is put on.  Do not  try to scratch the skin under the cast.  Check the skin around the cast every day. You may put lotion on red or sore areas.  Move the fingers of your casted hand several times a day.  Only take medicine as told by your doctor.  Follow up as told by your doctor. This is very important in order to avoid permanent injury, disability, or lasting (chronic) pain. GET HELP RIGHT AWAY IF:   You develop a rash.  You have problems breathing.  You have any allergy  problems.  You have more than a small spot of blood from beneath your cast or splint.  You have redness, puffiness (swelling), or more pain from beneath your cast or splint.  Yellowish-white fluid (pus) comes from beneath your cast or splint.  You develop a temperature by mouth above 102 F (38.9 C), not controlled by medicine.  You have a bad smell coming from under your cast or splint.  You have problems moving any of your fingers. If you do not have a window in your cast for looking at the wound, a fluid or a little bleeding may show up as a stain on the outside of your cast. Tell your doctor about any stains you see. MAKE SURE YOU:   Understand these instructions.  Will watch your condition.  Will get help right away if you are not doing well or get worse.   This information is not intended to replace advice given to you by your health care provider. Make sure you discuss any questions you have with your health care provider.   Document Released: 11/27/2007 Document Revised: 07/01/2014 Document Reviewed: 03/30/2014 Elsevier Interactive Patient Education [employer].

## 2015-08-22 NOTE — ED Provider Notes (Signed)
CSN: [id number]     Arrival date & time 08/22/15  1034 History   First MD Initiated Contact with Patient 08/22/15 1058     Chief Complaint  Patient presents with  . Fall     (Consider location/radiation/quality/duration/timing/severity/associated sxs/prior Treatment) HPI Comments: Patient is a 70 year old female who presents to the emergency department with a complaint of right wrist pain.  The patient states that she was attending a cardiology office visit on the campus, when she fell and injured her right wrist and hand. The patient also complains of some lower back pain, and a strain to her left calf. The patient denies being on any anticoagulation medications. She has not had any previous operations involving her right wrist or hand. She has chronic back problems. She is ambulatory, and denies any loss of consciousness, or any loss of bowel or bladder function.      Patient is a 70 y.o. female presenting with fall. The history is provided by the patient.  Fall Associated symptoms include arthralgias and myalgias.    Past Medical History  Diagnosis Date  . Hypertension   . Hyperlipidemia   . Gastroesophageal reflux disease   . Diverticulosis   . Anxiety and depression   . Adenomatous colon polyp 2006    excised in 2006 & 2010Due surveillance 06/2013  . Chronic back pain   . Thyroid nodule   . Fibromyalgia   . Migraines   . Insomnia   . Chronic constipation   . Asthma   . History of benign esophageal tumor 2010    granular cell esophageal tumor (Dx 06/2008), resected via EMR 2011, due repeat EGD 02/2012  . Chest discomfort   . Hiatal hernia   . Depression   . Anxiety   . Sleep apnea     Stop Bang score of 5. Pt said she was told by Dr. Merlene Laughter that she had "a little bit" of sleep apena, but not bad enough to treat.  . Breast tumor   . Complication of anesthesia   . PONV (postoperative nausea and vomiting)   . Tumor of esophagus   . Psychosis    Past Surgical  History  Procedure Laterality Date  . Tonsillectomy    . Abdominal hysterectomy    . Total knee arthroplasty  2002    Right; previous arthroscopic surgery  . Shoulder arthroscopy      Right; bone spurs removed  . Lung biopsy      negative  . 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 Right oophorectomy      benign disease  . Colonoscopy  06/2008, 06/2011    sigmoid tics, tubular adenoma; 2013: anal canal hemorrhoids  . Eus  08/2010    Dr [facility] with EGD. Retained food. No recurrent esophageal lesion, bx negative.  . Colonoscopy  07/10/2011    Anal canal hemorrhoids likely the cause of hematochezia in the setting of constipation; otherwise normal rectum ;submucosal  petechiae in left colon of doubtful clinical significance; otherwise, normal colon  . Esophagogastroduodenoscopy  12/11/2011    [id number] Schatzki's ring; otherwise normal/Small hiatal hernia. Antral and bulbar erosions  . Bravo ph study  12/11/2011    Procedure: BRAVO [place] STUDY;  Surgeon: Daneil Dolin, MD;  Location: [facility];  Service: Endoscopy;  Laterality: N/A;  . Cholecystectomy N/A 04/09/2013    Procedure: LAPAROSCOPIC CHOLECYSTECTOMY;  Surgeon: Jamesetta So, MD;  Location: [facility];  Service: General;  Laterality: N/A;  . Esophagogastroduodenoscopy (egd) with propofol N/A 05/08/2015    Dr. Gala Romney: mild erosive reflux esophagitis, non-critical Schatzki's ring s/p dilation. Hiatal hernia.   . Esophageal dilation N/A 05/08/2015    Procedure: ESOPHAGEAL DILATION;  Surgeon: Daneil Dolin, MD;  Location: [facility];  Service: Endoscopy;  Laterality: N/ANathaniel Man   Family History  Problem Relation Age of Onset  . Stroke Father   . Alcohol abuse Father   . Heart attack Mother   . Depression Mother   . Anxiety disorder Mother   . Colon cancer Paternal Aunt   . Colon cancer Paternal Uncle   . Dementia Maternal Uncle   . ADD / ADHD Neg Hx   . Bipolar disorder  Neg Hx   . Drug abuse Neg Hx   . OCD Neg Hx   . Paranoid behavior Neg Hx   . Schizophrenia Neg Hx   . Seizures Neg Hx   . Sexual abuse Neg Hx   . Physical abuse Neg Hx    Social History  Substance Use Topics  . Smoking status: Never Smoker   . Smokeless tobacco: Never Used  . Alcohol Use: No   OB History    No data available     Review of Systems  Musculoskeletal: Positive for myalgias, back pain and arthralgias.  Psychiatric/Behavioral: The patient is nervous/anxious.   All other systems reviewed and are negative.     Allergies  Elavil; Abilify; Trazodone and nefazodone; Codeine; Latex; Penicillin v; Penicillins; Polyethylene glycol; Sulfonamide derivatives; Cymbalta; and Remeron  Home Medications   Prior to Admission medications   Medication Sig Start Date End Date Taking? Authorizing Provider  acetaminophen (TYLENOL) 500 MG tablet Take 500 mg by mouth every 8 (eight) hours as needed for mild pain or moderate pain.   Yes Historical Provider, MD  albuterol (PROVENTIL HFA;VENTOLIN HFA) 108 (90 BASE) MCG/ACT inhaler Inhale 2 puffs into the lungs every 6 (six) hours as needed. For shortness of breath   Yes Historical Provider, MD  atenolol (TENORMIN) 50 MG tablet Take 1 tablet (50 mg total) by mouth 2 (two) times daily. 07/17/15  Yes Arnoldo Lenis, MD  cholecalciferol (VITAMIN D) 1000 units tablet Take 1,000 Units by mouth daily.   Yes Historical Provider, MD  clonazePAM (KLONOPIN) 1 MG tablet Take 1 tablet (1 mg total) by mouth 3 (three) times daily. 06/30/15 06/29/16 Yes Cloria Spring, MD  fluticasone (FLONASE) 50 MCG/ACT nasal spray Place 1 spray into both nostrils 2 (two) times daily.   Yes Historical Provider, MD  furosemide (LASIX) 40 MG tablet Take 40 mg DAILY AS NEEDED FOR SWELLING 08/22/15  Yes Arnoldo Lenis, MD  hydrochlorothiazide (HYDRODIURIL) 25 MG tablet Take 1 tablet (25 mg total) by mouth daily. 07/31/15  Yes Arnoldo Lenis, MD  losartan (COZAAR) 25 MG  tablet Take 1 tablet (25 mg total) by mouth daily. 07/31/15  Yes Arnoldo Lenis, MD  potassium chloride SA (K-DUR,KLOR-CON) 20 MEQ tablet TAKE ONE (1) TABLET EACH DAY 07/17/15  Yes Arnoldo Lenis, MD  QUEtiapine (SEROQUEL) 100 MG tablet Take 1 tablet (100 mg total) by mouth at bedtime. 06/30/15 06/29/16 Yes Cloria Spring, MD  Vilazodone HCl (VIIBRYD) 20 MG TABS Take 1 tablet (20 mg total) by mouth daily. 06/30/15  Yes Cloria Spring, MD   BP 155/92 mmHg  Pulse 79  Temp(Src) 98.7 F (37.1 C) (Oral)  Resp 18  Ht 5\' 2"  (1.575 m)  Wt  92.987 kg  BMI 37.49 kg/m2  SpO2 100% Physical Exam  Constitutional: She is oriented to person, place, and time. She appears well-developed and well-nourished.  Non-toxic appearance.  HENT:  Head: Normocephalic.  Right Ear: Tympanic membrane and external ear normal.  Left Ear: Tympanic membrane and external ear normal.  Eyes: EOM and lids are normal. Pupils are equal, round, and reactive to light.  Neck: Normal range of motion. Neck supple. Carotid bruit is not present.  Cardiovascular: Normal rate, regular rhythm, normal heart sounds, intact distal pulses and normal pulses.   Pulmonary/Chest: Breath sounds normal. No respiratory distress.  Abdominal: Soft. Bowel sounds are normal. There is no tenderness. There is no guarding.  Musculoskeletal:       Lumbar back: She exhibits tenderness and spasm. She exhibits no deformity.       Right hand: She exhibits deformity and swelling. She exhibits normal range of motion and normal capillary refill. Normal sensation noted. Normal strength noted.       Hands: There is bruising at the base of the right fourth and fifth metacarpal area. There is no pain in the anatomical snuff box on the right. There is no palpable deformity of the wrist. Is no palpable deformity of the forearm. Full range of motion is noted of the elbow and right shoulder.  Lymphadenopathy:       Head (right side): No submandibular adenopathy present.        Head (left side): No submandibular adenopathy present.    She has no cervical adenopathy.  Neurological: She is alert and oriented to person, place, and time. She has normal strength. No cranial nerve deficit or sensory deficit.  Skin: Skin is warm and dry.  Psychiatric: She has a normal mood and affect. Her speech is normal.  Nursing note and vitals reviewed.   ED Course Pt seen with me by Dr Reather Converse.  Procedures (including critical care time) Labs Review Labs Reviewed - No data to display  Imaging Review Dg Wrist Complete Right  08/22/2015  CLINICAL DATA:  Fall this morning, right wrist pain EXAM: RIGHT WRIST - COMPLETE 3+ VIEW COMPARISON:  None. FINDINGS: Three views of the right wrist submitted. No wrist fracture or subluxation. There is displaced fracture at the base of fifth metacarpal. Minimal displaced fracture at the base of fourth metacarpal. IMPRESSION: No wrist fracture or subluxation. Displaced fracture at the base of fifth metacarpal. Minimal displaced fracture at the base of fourth metacarpal. Electronically Signed   By: Lahoma Crocker M.D.   On: 08/22/2015 11:11   I have personally reviewed and evaluated these images and lab results as part of my medical decision-making.   EKG Interpretation None      MDM  X-ray of the right wrist reveals a nondisplaced fracture at the base of the fifth metacarpal, and a minimally displaced fracture at the base of the fourth metacarpal. There is no wrist fracture appreciated.  I discussed the findings with the patient in terms which he understands. Patient was fitted with a gutter splint and sling. Pain medication has been provided. Patient will be referred to hand specialist.    Final diagnoses:  None    *I have reviewed nursing notes, vital signs, and all appropriate lab and imaging results for this patient.[address] PA-C 08/22/15 1250  Elnora Morrison, MD 08/22/15 [phone]

## 2015-08-22 NOTE — ED Provider Notes (Signed)
Medical screening examination/treatment/procedure(s) were conducted as a shared visit with non-physician practitioner(s) or resident  and myself.  I personally evaluated the patient during the encounter and agree with the findings.   I have personally reviewed any xrays and/ or EKG's with the provider and I agree with interpretation.   No diagnosis found.  Dg Wrist Complete Right  08/22/2015  CLINICAL DATA:  Fall this morning, right wrist pain EXAM: RIGHT WRIST - COMPLETE 3+ VIEW COMPARISON:  None. FINDINGS: Three views of the right wrist submitted. No wrist fracture or subluxation. There is displaced fracture at the base of fifth metacarpal. Minimal displaced fracture at the base of fourth metacarpal. IMPRESSION: No wrist fracture or subluxation. Displaced fracture at the base of fifth metacarpal. Minimal displaced fracture at the base of fourth metacarpal. Electronically Signed   By: Lahoma Crocker M.D.   On: 08/22/2015 11:11   patient presents with mechanical fall tenderness to the right wrist. Patient has displaced metacarpal fracture. Plan for pain control and orthopedic follow-up. Splint placed in the ER. Patient has tenderness to palpation with range of motion in the wrist and hand.  Elnora Morrison, MD 08/22/15 [phone]

## 2015-08-22 NOTE — ED Notes (Signed)
Pt reports was walking on sidewalk into Dr. Nelly Laurence office and felt like shoe stuck to the cement and she fell.  Pt c/o pain to r wrist and left calf.  Swelling noted to wrist.

## 2015-08-22 NOTE — Patient Instructions (Signed)
Medication Instructions:  START TAKING LASIX 40 MG DAILY AS NEEDED FOR SWELLING   Labwork: NONE  Testing/Procedures: NONE  Follow-Up: Your physician recommends that you schedule a follow-up appointment in: [name], N.P.    Any Other Special Instructions Will Be Listed Below (If Applicable).  I AM GIVING YOU A BLOOD  PRESSURE LOG TO KEEP TRACK OF YOUR BLOOD PRESSURE LOG FOR ONE WEEK, AND EITHER CALL us WITH THE READINGS OR DROP THEM BACK OFF BY OUR OFFICE.     If you need a refill on your cardiac medications before your next appointment, please call your pharmacy.

## 2015-08-23 ENCOUNTER — Ambulatory Visit (INDEPENDENT_AMBULATORY_CARE_PROVIDER_SITE_OTHER): Payer: Self-pay | Admitting: Psychiatry

## 2015-08-23 ENCOUNTER — Encounter (HOSPITAL_COMMUNITY): Payer: Self-pay | Admitting: Psychiatry

## 2015-08-23 VITALS — BP 131/77 | HR 75 | Ht 62.0 in | Wt 206.6 lb

## 2015-08-23 DIAGNOSIS — F330 Major depressive disorder, recurrent, mild: Secondary | ICD-10-CM

## 2015-08-23 MED ORDER — VILAZODONE HCL 20 MG PO TABS: 20.0000 mg | ORAL_TABLET | Freq: Every day | ORAL | Status: DC

## 2015-08-23 MED ORDER — QUETIAPINE FUMARATE 100 MG PO TABS: 100.0000 mg | ORAL_TABLET | Freq: Every day | ORAL | Status: DC

## 2015-08-23 MED ORDER — CLONAZEPAM 1 MG PO TABS: 1.0000 mg | ORAL_TABLET | Freq: Three times a day (TID) | ORAL | Status: DC

## 2015-08-23 NOTE — Progress Notes (Signed)
[name], female   DOB: December 24, 1945, 70 y.o.   MRN: [id number] Patient ID: TARESSA RAUH, female   DOB: 28-Sep-1945, 70 y.o.   MRN: [id number] Patient ID: LANIQUA TORRENS, female   DOB: 02-19-1946, 70 y.o.   MRN: [id number] Patient ID: KRITI KATAYAMA, female   DOB: [birth year], 70 y.o.   MRN: [id number] Patient ID: KERI VEALE, female   DOB: 1945-12-23, 70 y.o.   MRN: [id number] Patient ID: ZAINAH STEVEN, female   DOB: 1945/08/29, 70 y.o.   MRN: [id number] Patient ID: KILEIGH ORTMANN, female   DOB: [birth year], 70 y.o.   MRN: [id number] Patient ID: NATALEE TOMKIEWICZ, female   DOB: 12/30/1945, 70 y.o.   MRN: [id number] Patient ID: HETTY LINHART, female   DOB: 07-26-1945, 70 y.o.   MRN: [id number] Patient ID: ARTA STUMP, female   DOB: 1946/01/26, 70 y.o.   MRN: [id number] Patient ID: ZIMAL WEISENSEL, female   DOB: 1946/05/28, 70 y.o.   MRN: [id number] Patient ID: KIWANNA SPRAKER, female   DOB: 1945/12/02, 70 y.o.   MRN: [id number] Patient ID: DORLIS JUDICE, female   DOB: [birth year], 70 y.o.   MRN: [id number] Patient ID: LEOMA FOLDS, female   DOB: 1946/02/11, 70 y.o.   MRN: [id number] Patient ID: SEPTEMBER MORMILE, female   DOB: 18-Sep-1945, 70 y.o.   MRN: [id number] [facility] [id number] Progress Note MAKENZIE WEISNER MRN: [id number] DOB: 08/23/1945 Age: 70 y.o.  Date: 08/23/2015 Start Time: 9:45 AM End Time: 10:10 AM  Chief Complaint: Chief Complaint  Patient presents with  . Depression  . Anxiety  . Memory Loss  . Follow-up    Subjective: "I' fell yesterday  This patient is a 70 year old black female who lives with her 27 year old daughter who has mild mental retardation. She lives in [place]. She is a retired [employer] manager. The patient states that she's been depressed "all my life.". She can't give any specific reasons for the depression but notes that at age 70 she was already taking medication to help with sleep. In her 70s her family doctor diagnosed with schizophrenia although  she had no symptoms of paranoia or delusions or hallucinations. She was given medication for this.  Since 1997 she's been coming here with symptoms of depression. She's also had significant problems with fibromyalgia and fatigue. In Neurontin and Ambien have helped. Lately however she's become more depressed and worried. She has had gallbladder surgery and she's concerned about how is going to go. For some reason she's been thinking a lot about death. She doesn't have anyone to stay with her daughter something goes wrong. She's been more negative but is  not suicidal but her energy has dropped. In general she is a very functional person  The patient returns after 4 months. She is accompanied by her sister. She was in [facility] hospital most of last month. Her sister lives in [place] and brought her there after she noticed the patient wasn't making sense see more paranoid and confused and couldn't take care of her basic needs. She stayed in the medical part of the hospital for about 3 weeks and then was transferred to psychiatry. She had a very thorough neurological workup including brain CT MRI and lumbar puncture all of which were noncontributory. It was thought she had a UTI which was treated. She was taken off gabapentin and Ambien. She was tapered off Ativan and went home on no benzodiazepines except enough for 3 days.  All she was in the hospital she was treated with Haldol but this was tapered off as well and she is only been discharged on Viibryd and trazodone to which she is allergic.  The patient is seen after 4 weeks. She states that she went back to [facility] to see the endocrinologist recently. While she was there in the hospital was determined that she had elevated calcium and elevated parathyroid hormone. She had a thyroid scan with [facility] indicates a nodule which may be producing extra parathyroid hormone. She's probably going to need surgical intervention. Hyperparathyroidism may explain some of her  symptoms like the tiredness fatigue and confusion depression nausea etc. Yesterday when she went to her cardiologist she fell in the parking lot and broke 3 metacarpal bones in her right hand. Bones breaking easily is another symptom of hyperparathyroidism. She does state that her mood is fairly good and she is less anxious and is sleeping well. She didn't mention any of the paranoid delusions and denied these when asked directly. She is alert and oriented and very easiy to converse with  Vitals: BP 131/77 mmHg  Pulse 75  Ht '5\' 2"'  (1.575 m)  Wt 206 lb 9.6 oz (93.713 kg)  BMI 37.78 kg/m2  SpO2 96%  Psychiatric history Patient has a long history of depression.  She has been seeing in this office since May 1997.  Patient denies any history of suicidal attempt however endorse chronic depression and anxiety.  In the past she has taken Prozac but did not like after taking few doses, she also has taken Seroquel, Abilify, Xanax, Pamelor, and amitriptyline.  Patient denies a history of paranoia or delusions however there are times when she has been very depressed and have a lot of negative symptoms.  Alcohol and substance use history Patient denies any history of alcohol or substance use.  Allergies: Allergies  Allergen Reactions  . Elavil [Amitriptyline] Other (See Comments)    Felt really nervous and felt like something was hold her feet or arm and/ or AM headache on it and back on it and went away off it.   . Abilify [Aripiprazole] Other (See Comments)    Dystonic reaction  . Trazodone And Nefazodone Other (See Comments)    Brought back bad dreams of things in the past  . Codeine Hives, Nausea Only and Other (See Comments)    Feels funny  . Latex Hives  . Penicillin V Itching  . Penicillins Hives    Has patient had a PCN reaction causing immediate rash, facial/tongue/throat swelling, SOB or lightheadedness with hypotension: Yes Has patient had a PCN reaction causing severe rash involving  mucus membranes or skin necrosis: No Has patient had a PCN reaction that required hospitalization No Has patient had a PCN reaction occurring within the last 10 years: No If all of the above answers are "NO", then may proceed with Cephalosporin use.   . Polyethylene Glycol Other (See Comments)    Per allergy test  . Sulfonamide Derivatives Nausea And Vomiting  . Cymbalta [Duloxetine Hcl] Rash  . Remeron [Mirtazapine] Other (See Comments)    Caused lots of strange, weird, crazy dreams.   Medical History: Past Medical History  Diagnosis Date  . Hypertension   . Hyperlipidemia   . Gastroesophageal reflux disease   . Diverticulosis   . Anxiety and depression   . Adenomatous colon polyp 2006    excised in 2006 & 2010Due surveillance 06/2013  . Chronic back pain   . Thyroid nodule   .  Fibromyalgia   . Migraines   . Insomnia   . Chronic constipation   . Asthma   . History of benign esophageal tumor 2010    granular cell esophageal tumor (Dx 06/2008), resected via EMR 2011, due repeat EGD 02/2012  . Chest discomfort   . Hiatal hernia   . Depression   . Anxiety   . Sleep apnea     Stop Bang score of 5. Pt said she was told by Dr. Merlene Laughter that she had "a little bit" of sleep apena, but not bad enough to treat.  . Breast tumor   . Complication of anesthesia   . PONV (postoperative nausea and vomiting)   . Tumor of esophagus   . Psychosis   Patient has history of hypertension, hyperlipidemia, GERD, diverticulosis, colonic polyp, chronic back pain, fibromyalgia, chronic constipation, benign tumor of esophagus and chronic headache.  Her primary care physician is Dr. Legrand Rams, she sees Dr. Paulita Cradle for chronic pain, fibromyalgia and headache. Her cardiologist is Dr Elvera Maria. Surgical History: Past Surgical History  Procedure Laterality Date  . Tonsillectomy    . Abdominal hysterectomy    . Total knee arthroplasty  2002    Right; previous arthroscopic surgery  . Shoulder arthroscopy       Right; bone spurs removed  . Lung biopsy      negative  . 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 Right oophorectomy      benign disease  . Colonoscopy  06/2008, 06/2011    sigmoid tics, tubular adenoma; 2013: anal canal hemorrhoids  . Eus  08/2010    Dr [facility] with EGD. Retained food. No recurrent esophageal lesion, bx negative.  . Colonoscopy  07/10/2011    Anal canal hemorrhoids likely the cause of hematochezia in the setting of constipation; otherwise normal rectum ;submucosal  petechiae in left colon of doubtful clinical significance; otherwise, normal colon  . Esophagogastroduodenoscopy  12/11/2011    TMA:[id number] Schatzki's ring; otherwise normal/Small hiatal hernia. Antral and bulbar erosions  . Bravo ph study  12/11/2011    Procedure: BRAVO Gordon STUDY;  Surgeon: Daneil Dolin, MD;  Location: [facility];  Service: Endoscopy;  Laterality: N/A;  . Cholecystectomy N/A 04/09/2013    Procedure: LAPAROSCOPIC CHOLECYSTECTOMY;  Surgeon: Jamesetta So, MD;  Location: [facility] ORS;  Service: General;  Laterality: N/A;  . Esophagogastroduodenoscopy (egd) with propofol N/A 05/08/2015    Dr. Gala Romney: mild erosive reflux esophagitis, non-critical Schatzki's ring s/p dilation. Hiatal hernia.   . Esophageal dilation N/A 05/08/2015    Procedure: ESOPHAGEAL DILATION;  Surgeon: Daneil Dolin, MD;  Location: [facility];  Service: Endoscopy;  Laterality: N/A;  [name] [age]   Family History: family history includes Alcohol abuse in her father; Anxiety disorder in her mother; Colon cancer in her paternal aunt and paternal uncle; Dementia in her maternal uncle; Depression in her mother; Heart attack in her mother; Stroke in her father. There is no history of ADD / ADHD, Bipolar disorder, Drug abuse, OCD, Paranoid behavior, Schizophrenia, Seizures, Sexual abuse, or Physical abuse. Reviewed and nothing new today again.  Mental status examination Patient is casually  dressed and fairly groomed. She has her right arm in a splint She is calm cooperative and maintained fair eye contact. She described her mood as is improved but she is still little anxious She still has chronic pain  Her speech is coherent but slow.  Her thought processes clear and logical. She  denies suicidal ideation and no active or passive homicidal thoughts e. She denies any auditory or visual hallucination or delusions this time. There no psychotic symptoms present at this time. She's alert and oriented x3. Her insight judgment impulse control is okay her fund of knowledge is fair and her language skills are good  Lab Results:  Results for orders placed or performed in visit on 07/26/15 (from the past 8736 hour(s))  COMPLETE METABOLIC PANEL WITH GFR   Collection Time: 07/26/15  9:34 AM  Result Value Ref Range   Sodium 140 135 - 146 mmol/L   Potassium 4.0 3.5 - 5.3 mmol/L   Chloride 108 98 - 110 mmol/L   CO2 23 20 - 31 mmol/L   Glucose, Bld 96 65 - 99 mg/dL   BUN 8 7 - 25 mg/dL   Creat 1.03 (H) 0.50 - 0.99 mg/dL   Total Bilirubin 0.5 0.2 - 1.2 mg/dL   Alkaline Phosphatase 48 33 - 130 U/L   AST 15 10 - 35 U/L   ALT 12 6 - 29 U/L   Total Protein 6.2 6.1 - 8.1 g/dL   Albumin 3.9 3.6 - 5.1 g/dL   Calcium 10.7 (H) 8.6 - 10.4 mg/dL   GFR, Est African American 64 >=60 mL/min   GFR, Est Non African American 56 (L) >=60 mL/min  Results for orders placed or performed during the hospital encounter of 05/01/15 (from the past 8736 hour(s))  CBC with Differential/Platelet   Collection Time: 05/01/15  2:10 PM  Result Value Ref Range   WBC 4.6 4.0 - 10.5 K/uL   RBC 4.71 3.87 - 5.11 MIL/uL   Hemoglobin 12.5 12.0 - 15.0 g/dL   HCT 39.5 36.0 - 46.0 %   MCV 83.9 78.0 - 100.0 fL   MCH 26.5 26.0 - 34.0 pg   MCHC 31.6 30.0 - 36.0 g/dL   RDW 15.0 11.5 - 15.5 %   Platelets 188 150 - 400 K/uL   Neutrophils Relative % 53 %   Neutro Abs 2.4 1.7 - 7.7 K/uL   Lymphocytes Relative 39 %   Lymphs Abs 1.8  0.7 - 4.0 K/uL   Monocytes Relative 6 %   Monocytes Absolute 0.3 0.1 - 1.0 K/uL   Eosinophils Relative 2 %   Eosinophils Absolute 0.1 0.0 - 0.7 K/uL   Basophils Relative 0 %   Basophils Absolute 0.0 0.0 - 0.1 K/uL  Basic metabolic panel   Collection Time: 05/01/15  2:10 PM  Result Value Ref Range   Sodium 141 135 - 145 mmol/L   Potassium 3.9 3.5 - 5.1 mmol/L   Chloride 107 101 - 111 mmol/L   CO2 26 22 - 32 mmol/L   Glucose, Bld 95 65 - 99 mg/dL   BUN 10 6 - 20 mg/dL   Creatinine, Ser 1.13 (H) 0.44 - 1.00 mg/dL   Calcium 10.2 8.9 - 10.3 mg/dL   GFR calc non Af Amer 48 (L) >60 mL/min   GFR calc Af Amer 56 (L) >60 mL/min   Anion gap 8 5 - 15  Results for orders placed or performed in visit on 04/25/15 (from the past 8736 hour(s))  Clostridium Difficile by PCR   Collection Time: 05/03/15 11:17 AM  Result Value Ref Range   Toxigenic C Difficile by pcr Not Detected Not Detected  Results for orders placed or performed in visit on 09/29/14 (from the past 8736 hour(s))  Basic Metabolic Panel (BMET)   Collection Time: 10/29/14 10:32 AM  Result Value Ref Range   Sodium 141 135 - 145 mEq/L   Potassium 3.6 3.5 - 5.3 mEq/L   Chloride 104 96 - 112 mEq/L   CO2 28 19 - 32 mEq/L   Glucose, Bld 95 70 - 99 mg/dL   BUN 17 6 - 23 mg/dL   Creat 1.10 0.50 - 1.10 mg/dL   Calcium 10.2 8.4 - 10.5 mg/dL  Magnesium   Collection Time: 10/29/14 10:32 AM  Result Value Ref Range   Magnesium 2.1 1.5 - 2.5 mg/dL  Results for orders placed or performed in visit on 09/27/14 (from the past 8736 hour(s))  Lipid Profile   Collection Time: 09/27/14 11:29 AM  Result Value Ref Range   Cholesterol 173 0 - 200 mg/dL   Triglycerides 88 <150 mg/dL   HDL 61 >=46 mg/dL   Total CHOL/HDL Ratio 2.8 Ratio   VLDL 18 0 - 40 mg/dL   LDL Cholesterol 94 0 - 99 mg/dL  Basic Metabolic Panel (BMET)   Collection Time: 09/27/14 11:29 AM  Result Value Ref Range   Sodium 141 135 - 145 mEq/L   Potassium 3.8 3.5 - 5.3 mEq/L    Chloride 101 96 - 112 mEq/L   CO2 29 19 - 32 mEq/L   Glucose, Bld 94 70 - 99 mg/dL   BUN 15 6 - 23 mg/dL   Creat 1.05 0.50 - 1.10 mg/dL   Calcium 9.8 8.4 - 10.5 mg/dL  CBC with Differential   Collection Time: 09/27/14 11:29 AM  Result Value Ref Range   WBC 5.8 4.0 - 10.5 K/uL   RBC 4.92 3.87 - 5.11 MIL/uL   Hemoglobin 12.7 12.0 - 15.0 g/dL   HCT 40.0 36.0 - 46.0 %   MCV 81.3 78.0 - 100.0 fL   MCH 25.8 (L) 26.0 - 34.0 pg   MCHC 31.8 30.0 - 36.0 g/dL   RDW 15.8 (H) 11.5 - 15.5 %   Platelets 207 150 - 400 K/uL   MPV 9.5 8.6 - 12.4 fL   Neutrophils Relative % 55 43 - 77 %   Neutro Abs 3.2 1.7 - 7.7 K/uL   Lymphocytes Relative 34 12 - 46 %   Lymphs Abs 2.0 0.7 - 4.0 K/uL   Monocytes Relative 8 3 - 12 %   Monocytes Absolute 0.5 0.1 - 1.0 K/uL   Eosinophils Relative 2 0 - 5 %   Eosinophils Absolute 0.1 0.0 - 0.7 K/uL   Basophils Relative 1 0 - 1 %   Basophils Absolute 0.1 0.0 - 0.1 K/uL   Smear Review Criteria for review not met   Magnesium   Collection Time: 09/27/14 11:29 AM  Result Value Ref Range   Magnesium 1.6 1.5 - 2.5 mg/dL   Review of systems is positive for bilateral knee pain  Assessment Axis I Maj. depressive disorder Axis II deferred Axis III see medical history Axis IV are to moderate.  Plan: I took her vitals.  I reviewed CC, tobacco/med/surg Hx, meds effects/ side effects, problem list, therapies and responses as well as current situation/symptoms discussed options. She will continue Viibryd 20 mg daily. She will continue clonazepam 1 mg 3 times a day to help with the anxiety and Seroquel 100 mg at bedtime to help with anxiety sleep and confusion/paranoia. She will return in 2 months  See orders and pt instructions for more details.  MEDICATIONS this encounter: Meds ordered this encounter  Medications  . QUEtiapine (SEROQUEL) 100 MG tablet    Sig:  Take 1 tablet (100 mg total) by mouth at bedtime.    Dispense:  30 tablet    Refill:  2  . Vilazodone HCl  (VIIBRYD) 20 MG TABS    Sig: Take 1 tablet (20 mg total) by mouth daily.    Dispense:  30 tablet    Refill:  2  . clonazePAM (KLONOPIN) 1 MG tablet    Sig: Take 1 tablet (1 mg total) by mouth 3 (three) times daily.    Dispense:  90 tablet    Refill:  2    Medical Decision Making Problem Points:  Established problem, stable/improving (1), New problem, with additional work-up planned (4), Review of last therapy session (1) and Review of psycho-social stressors (1) Data Points:  Review or order clinical lab tests (1) Review of new medications or change in dosage (2)  I certify that outpatient services furnished can reasonably be expected to improve the patient's condition.   Levonne Spiller, MD

## 2015-08-24 ENCOUNTER — Other Ambulatory Visit: Payer: Self-pay

## 2015-08-24 NOTE — Patient Outreach (Signed)
08/24/15  Subjective [name] is a 70 year old female who was referred to me for medication adherence.  I called her today to follow up on her medication adherence.  She stated she fell on Wednesday at the provider's office and broke her arm and fingers.  She will have to have surgery on it.  She also stated she heard from her thyroid provider and he is ready to do the thyroid surgery as well. I asked how she was doing with taking her medications.  She stated she was doing well.  She stated her daughter was helping her now as well as the alarm.  She stated as long as her daughter does not fall asleep, she reminds her to take her medications.    Objective Outpatient Encounter Prescriptions as of 08/24/2015  Medication Sig Note  . acetaminophen (TYLENOL) 500 MG tablet Take 500 mg by mouth every 8 (eight) hours as needed for mild pain or moderate pain.   Marland Kitchen albuterol (PROVENTIL HFA;VENTOLIN HFA) 108 (90 BASE) MCG/ACT inhaler Inhale 2 puffs into the lungs every 6 (six) hours as needed. For shortness of breath   . cholecalciferol (VITAMIN D) 1000 units tablet Take 1,000 Units by mouth daily.   . clonazePAM (KLONOPIN) 1 MG tablet Take 1 tablet (1 mg total) by mouth 3 (three) times daily.   . fluticasone (FLONASE) 50 MCG/ACT nasal spray Place 1 spray into both nostrils 2 (two) times daily. 07/14/2015: Using once a day  . furosemide (LASIX) 40 MG tablet Take 40 mg DAILY AS NEEDED FOR SWELLING   . hydrochlorothiazide (HYDRODIURIL) 25 MG tablet Take 1 tablet (25 mg total) by mouth daily.   Marland Kitchen HYDROcodone-acetaminophen (NORCO/VICODIN) 5-325 MG tablet Take 1 tablet by mouth every 4 (four) hours as needed.   Marland Kitchen losartan (COZAAR) 25 MG tablet Take 1 tablet (25 mg total) by mouth daily.   . potassium chloride SA (K-DUR,KLOR-CON) 20 MEQ tablet TAKE ONE (1) TABLET EACH DAY   . QUEtiapine (SEROQUEL) 100 MG tablet Take 1 tablet (100 mg total) by mouth at bedtime.   . Vilazodone HCl (VIIBRYD) 20 MG TABS Take 1 tablet (20  mg total) by mouth daily.   Marland Kitchen atenolol (TENORMIN) 50 MG tablet Take 1 tablet (50 mg total) by mouth 2 (two) times daily. (Patient not taking: Reported on 08/24/2015)    No facility-administered encounter medications on file as of 08/24/2015.   Assessment  Drugs sorted by system:  Neurologic/Psychologic: clonazepam, quetiapine, vilazodone  Cardiovascular: furosemide, hydrochlorothiazide, losartan  Pulmonary/Allergy: albuterol, flonase,   Gastrointestinal: none  Endocrine: none  Renal: none  Topical: none  Pain: acetaminophen, hydrocodone-acetaminophen  Vitamins/Minerals: vitamin D, potassium  Infectious Diseases: none  Miscellaneous: none   Duplications in therapy:  none Gaps in therapy:  none Medications to avoid in the elderly:  clonazepam Drug interactions: none Other issues noted:  none  Plan: 1.  [name] is doing well with her adherence.  I am going to close her case to pharmacy since all her pharmacy needs have been met.  I am happy to assist her in the future if any pharmacy needs arise.   Deanne Coffer, PharmD, [place] [phone]

## 2015-08-25 DIAGNOSIS — J40 Bronchitis, not specified as acute or chronic: Secondary | ICD-10-CM | POA: Diagnosis not present

## 2015-08-25 DIAGNOSIS — J302 Other seasonal allergic rhinitis: Secondary | ICD-10-CM | POA: Diagnosis not present

## 2015-08-28 ENCOUNTER — Telehealth: Payer: Self-pay | Admitting: Orthopedic Surgery

## 2015-08-28 ENCOUNTER — Ambulatory Visit (INDEPENDENT_AMBULATORY_CARE_PROVIDER_SITE_OTHER): Payer: Medicare Other | Admitting: Orthopedic Surgery

## 2015-08-28 DIAGNOSIS — S63044A Dislocation of carpometacarpal joint of right thumb, initial encounter: Secondary | ICD-10-CM | POA: Diagnosis not present

## 2015-08-28 DIAGNOSIS — S63054A Dislocation of other carpometacarpal joint of right hand, initial encounter: Secondary | ICD-10-CM

## 2015-08-28 MED ORDER — OXYCODONE-ACETAMINOPHEN 5-325 MG PO TABS: 1.0000 | ORAL_TABLET | ORAL | Status: DC | PRN

## 2015-08-28 NOTE — Patient Instructions (Addendum)
Routine cast care  Follow-up after parathyroid surgery for x-rays out of plaster  Cast or Splint Care Casts and splints support injured limbs and keep bones from moving while they heal. It is important to care for your cast or splint at home.  HOME CARE INSTRUCTIONS  Keep the cast or splint uncovered during the drying period. It can take 24 to 48 hours to dry if it is made of plaster. A fiberglass cast will dry in less than 1 hour.  Do not rest the cast on anything harder than a pillow for the first 24 hours.  Do not put weight on your injured limb or apply pressure to the cast until your health care provider gives you permission.  Keep the cast or splint dry. Wet casts or splints can lose their shape and may not support the limb as well. A wet cast that has lost its shape can also create harmful pressure on your skin when it dries. Also, wet skin can become infected.  Cover the cast or splint with a plastic bag when bathing or when out in the rain or snow. If the cast is on the trunk of the body, take sponge baths until the cast is removed.  If your cast does become wet, dry it with a towel or a blow dryer on the cool setting only.  Keep your cast or splint clean. Soiled casts may be wiped with a moistened cloth.  Do not place any hard or soft foreign objects under your cast or splint, such as cotton, toilet paper, lotion, or powder.  Do not try to scratch the skin under the cast with any object. The object could get stuck inside the cast. Also, scratching could lead to an infection. If itching is a problem, use a blow dryer on a cool setting to relieve discomfort.  Do not trim or cut your cast or remove padding from inside of it.  Exercise all joints next to the injury that are not immobilized by the cast or splint. For example, if you have a long leg cast, exercise the hip joint and toes. If you have an arm cast or splint, exercise the shoulder, elbow, thumb, and fingers.  Elevate  your injured arm or leg on 1 or 2 pillows for the first 1 to 3 days to decrease swelling and pain.It is best if you can comfortably elevate your cast so it is higher than your heart. SEEK MEDICAL CARE IF:   Your cast or splint cracks.  Your cast or splint is too tight or too loose.  You have unbearable itching inside the cast.  Your cast becomes wet or develops a soft spot or area.  You have a bad smell coming from inside your cast.  You get an object stuck under your cast.  Your skin around the cast becomes red or raw.  You have new pain or worsening pain after the cast has been applied. SEEK IMMEDIATE MEDICAL CARE IF:   You have fluid leaking through the cast.  You are unable to move your fingers or toes.  You have discolored (blue or white), cool, painful, or very swollen fingers or toes beyond the cast.  You have tingling or numbness around the injured area.  You have severe pain or pressure under the cast.  You have any difficulty with your breathing or have shortness of breath.  You have chest pain.   This information is not intended to replace advice given to you by your health  care provider. Make sure you discuss any questions you have with your health care provider.   Document Released: 06/07/2000 Document Revised: 03/31/2013 Document Reviewed: 12/17/2012 Elsevier Interactive Patient Education [employer].

## 2015-08-28 NOTE — Telephone Encounter (Signed)
Patient's sister/designated party contact called to speak with Dr Aline Brochure as discussed with patient at today's appointment (form on file) regarding treatment plan - her sister's landline ph # [phone].

## 2015-08-28 NOTE — Progress Notes (Signed)
NEW PROBLEM   Fracture right CMC joint #5 and fourth metacarpal base  Pain right hand  The patient fell injured her right hand. She scheduled for surgery for parathyroid gland removal in [place], [place]  Location right hand. Quality dull. Severity moderate. Duration, date of injury 08/22/2015 Timing constant  Review of systems constitutional symptoms no fever chills or fatigue neurologic symptoms no numbness or tingling has some chronic unsteady gait. Loss of memory.  Past Medical History  Diagnosis Date  . Hypertension   . Hyperlipidemia   . Gastroesophageal reflux disease   . Diverticulosis   . Anxiety and depression   . Adenomatous colon polyp 2006    excised in 2006 & 2010Due surveillance 06/2013  . Chronic back pain   . Thyroid nodule   . Fibromyalgia   . Migraines   . Insomnia   . Chronic constipation   . Asthma   . History of benign esophageal tumor 2010    granular cell esophageal tumor (Dx 06/2008), resected via EMR 2011, due repeat EGD 02/2012  . Chest discomfort   . Hiatal hernia   . Depression   . Anxiety   . Sleep apnea     Stop Bang score of 5. Pt said she was told by Dr. Merlene Laughter that she had "a little bit" of sleep apena, but not bad enough to treat.  . Breast tumor   . Complication of anesthesia   . PONV (postoperative nausea and vomiting)   . Tumor of esophagus   . Psychosis    Gen. appearance normal oriented 3 mood affect flat gait station no significant limp vitals are stable  Right hand is tender at the base of the fifth and fourth metacarpal no gross deformity. Rotatory alignment looks normal compared to the left hand painful range of motion is diminished secondary to the pain there is crepitance and instability of the base of the fifth metacarpal which is severely painful muscle tone is normal in the right arm skin is intact with some ecchymosis pulses and color are good lymph nodes epitrochlear region are negative sensation is normal no  pathologic reflexes patient's balance was normal coming into the office  X-ray show CMC fracture dislocation with fourth metacarpal fracture as well  These fractures aren't highly unstable and usually require pinning  We are week out now so we've got that is an issue and then the parathyroid surgery will have to take precedent  Patient be placed in a cast for now and then we'll reassess after the surgery with x-rays out of plaster.

## 2015-08-31 ENCOUNTER — Ambulatory Visit (INDEPENDENT_AMBULATORY_CARE_PROVIDER_SITE_OTHER): Payer: Medicare Other

## 2015-08-31 DIAGNOSIS — Z1211 Encounter for screening for malignant neoplasm of colon: Secondary | ICD-10-CM | POA: Diagnosis not present

## 2015-08-31 LAB — IFOBT (OCCULT BLOOD): IFOBT: NEGATIVE

## 2015-08-31 NOTE — Progress Notes (Signed)
Pt returned one iFOBT and it was negative.  

## 2015-09-01 DIAGNOSIS — E210 Primary hyperparathyroidism: Secondary | ICD-10-CM | POA: Diagnosis not present

## 2015-09-01 DIAGNOSIS — E041 Nontoxic single thyroid nodule: Secondary | ICD-10-CM | POA: Diagnosis not present

## 2015-09-03 NOTE — Progress Notes (Signed)
Quick Note:  ifobt negative. Keep follow-up appt in May. ______

## 2015-09-04 NOTE — Progress Notes (Signed)
Quick Note:  I called pt and LMOM ( it was her VM) that the stool test was negative. Keep appt in May. Call if any questions. ______

## 2015-09-06 DIAGNOSIS — S8002XA Contusion of left knee, initial encounter: Secondary | ICD-10-CM | POA: Diagnosis not present

## 2015-09-06 DIAGNOSIS — S8012XA Contusion of left lower leg, initial encounter: Secondary | ICD-10-CM | POA: Diagnosis not present

## 2015-09-11 DIAGNOSIS — E041 Nontoxic single thyroid nodule: Secondary | ICD-10-CM | POA: Diagnosis not present

## 2015-09-11 DIAGNOSIS — E210 Primary hyperparathyroidism: Secondary | ICD-10-CM | POA: Diagnosis not present

## 2015-09-11 DIAGNOSIS — Z79899 Other long term (current) drug therapy: Secondary | ICD-10-CM | POA: Diagnosis not present

## 2015-09-12 ENCOUNTER — Encounter: Payer: Self-pay | Admitting: *Deleted

## 2015-09-12 ENCOUNTER — Other Ambulatory Visit: Payer: Self-pay | Admitting: *Deleted

## 2015-09-12 NOTE — Patient Outreach (Signed)
Call to patient to assess for discharge from [facility] program services.  Patient does report she is seeing a Psychologist, sport and exercise at [facility] and now seeing an Human resources officer. She has had biopsies of thyroid She has a broken right arm/wrist from a recent fall, states she was stepping up onto a tall sidewalk and fell and reached out to catch herself. Patient states she will need Home health care and therapy after her arm surgery, which should be provided by home health agency.  Patient agrees to discharge at this time, no new goals verbalized. Patient agrees to call RNCM with any questions or concerns.  Plan: [facility] CM Care Plan Problem One        Most Recent Value   Care Plan Problem One  Medication management as evidenced by medication non adherence   Role Documenting the Problem One  Care Management [place] for Problem One  Active   [facility] Long Term Goal (31-90 days)  Patient will be able to self manage medications over the next 60 days   [facility] Long Term Goal Start Date  07/14/15   [facility] Long Term Goal Met Date  08/24/15   Interventions for Problem One Long Term Goal  goal met     RNCM will close case as goal met and RNCM will send patient and MD letter regarding case closure. Royetta Crochet. Laymond Purser, RN, BSN, [place] [phone]

## 2015-09-13 ENCOUNTER — Ambulatory Visit (INDEPENDENT_AMBULATORY_CARE_PROVIDER_SITE_OTHER): Payer: Medicare Other | Admitting: Orthopedic Surgery

## 2015-09-13 ENCOUNTER — Encounter: Payer: Self-pay | Admitting: Orthopedic Surgery

## 2015-09-13 VITALS — Ht 62.0 in | Wt 206.0 lb

## 2015-09-13 DIAGNOSIS — M1712 Unilateral primary osteoarthritis, left knee: Secondary | ICD-10-CM

## 2015-09-13 DIAGNOSIS — M25562 Pain in left knee: Secondary | ICD-10-CM

## 2015-09-13 DIAGNOSIS — S8002XA Contusion of left knee, initial encounter: Secondary | ICD-10-CM

## 2015-09-13 NOTE — Progress Notes (Signed)
[name], female   DOB: 01-04-1946, 70 y.o.   MRN: [id number]  Chief Complaint  Patient presents with  . Knee Pain    left knee pain    HPI [name] is a 70 y.o. female.  New problem the patient fell back on February 28 fractured her [facility] joint #5 and fourth metacarpal right hand was placed in a cast on March 6. She also injured herself on the 28th injuring her left knee complains of left knee pain. She also complains of left leg pain radiating from the knee down to the ankle.  Dull aching pain over the lateral joint with radiation into the foot. No swelling or effusion  Review of Systems Review of Systems  Constitutional: Positive for fatigue.  Neurological: Negative for numbness.  Psychiatric/Behavioral: Positive for confusion.    Past Medical History  Diagnosis Date  . Hypertension   . Hyperlipidemia   . Gastroesophageal reflux disease   . Diverticulosis   . Anxiety and depression   . Adenomatous colon polyp 2006    excised in 2006 & 2010Due surveillance 06/2013  . Chronic back pain   . Thyroid nodule   . Fibromyalgia   . Migraines   . Insomnia   . Chronic constipation   . Asthma   . History of benign esophageal tumor 2010    granular cell esophageal tumor (Dx 06/2008), resected via EMR 2011, due repeat EGD 02/2012  . Chest discomfort   . Hiatal hernia   . Depression   . Anxiety   . Sleep apnea     Stop Bang score of 5. Pt said she was told by Dr. Merlene Laughter that she had "a little bit" of sleep apena, but not bad enough to treat.  . Breast tumor   . Complication of anesthesia   . PONV (postoperative nausea and vomiting)   . Tumor of esophagus   . Psychosis     Past Surgical History  Procedure Laterality Date  . Tonsillectomy    . Abdominal hysterectomy    . Total knee arthroplasty  2002    Right; previous arthroscopic surgery  . Shoulder arthroscopy      Right; bone spurs removed  . Lung biopsy      negative  . Breast excisional biopsy   1990s, 2012    Left x2-sclerosing ductal papilloma-2012  . Right oophorectomy      benign disease  . Colonoscopy  06/2008, 06/2011    sigmoid tics, tubular adenoma; 2013: anal canal hemorrhoids  . Eus  08/2010    Dr [facility] with EGD. Retained food. No recurrent esophageal lesion, bx negative.  . Colonoscopy  07/10/2011    Anal canal hemorrhoids likely the cause of hematochezia in the setting of constipation; otherwise normal rectum ;submucosal  petechiae in left colon of doubtful clinical significance; otherwise, normal colon  . Esophagogastroduodenoscopy  12/11/2011    [id number] Schatzki's ring; otherwise normal/Small hiatal hernia. Antral and bulbar erosions  . Bravo ph study  12/11/2011    Procedure: BRAVO [place] STUDY;  Surgeon: Daneil Dolin, MD;  Location: [facility] ENDO SUITE;  Service: Endoscopy;  Laterality: N/A;  . Cholecystectomy N/A 04/09/2013    Procedure: LAPAROSCOPIC CHOLECYSTECTOMY;  Surgeon: Jamesetta So, MD;  Location: [facility] ORS;  Service: General;  Laterality: N/A;  . Esophagogastroduodenoscopy (egd) with propofol N/A 05/08/2015    Dr. Gala Romney: mild erosive reflux esophagitis, non-critical Schatzki's ring s/p dilation. Hiatal hernia.   . Esophageal dilation N/A 05/08/2015  Procedure: ESOPHAGEAL DILATION;  Surgeon: Daneil Dolin, MD;  Location: [facility] ORS;  Service: Endoscopy;  Laterality: N/ANathaniel Man    Family History  Problem Relation Age of Onset  . Stroke Father   . Alcohol abuse Father   . Heart attack Mother   . Depression Mother   . Anxiety disorder Mother   . Colon cancer Paternal Aunt   . Colon cancer Paternal Uncle   . Dementia Maternal Uncle   . ADD / ADHD Neg Hx   . Bipolar disorder Neg Hx   . Drug abuse Neg Hx   . OCD Neg Hx   . Paranoid behavior Neg Hx   . Schizophrenia Neg Hx   . Seizures Neg Hx   . Sexual abuse Neg Hx   . Physical abuse Neg Hx     Social History Social History  Substance Use Topics  . Smoking status: Never Smoker   .  Smokeless tobacco: Never Used  . Alcohol Use: No    Allergies  Allergen Reactions  . Elavil [Amitriptyline] Other (See Comments)    Felt really nervous and felt like something was hold her feet or arm and/ or AM headache on it and back on it and went away off it.   . Abilify [Aripiprazole] Other (See Comments)    Dystonic reaction  . Trazodone And Nefazodone Other (See Comments)    Brought back bad dreams of things in the past  . Codeine Hives, Nausea Only and Other (See Comments)    Feels funny  . Latex Hives  . Penicillin V Itching  . Penicillins Hives    Has patient had a PCN reaction causing immediate rash, facial/tongue/throat swelling, SOB or lightheadedness with hypotension: Yes Has patient had a PCN reaction causing severe rash involving mucus membranes or skin necrosis: No Has patient had a PCN reaction that required hospitalization No Has patient had a PCN reaction occurring within the last 10 years: No If all of the above answers are "NO", then may proceed with Cephalosporin use.   . Polyethylene Glycol Other (See Comments)    Per allergy test  . Sulfonamide Derivatives Nausea And Vomiting  . Cymbalta [Duloxetine Hcl] Rash  . Remeron [Mirtazapine] Other (See Comments)    Caused lots of strange, weird, crazy dreams.    Current Outpatient Prescriptions  Medication Sig Dispense Refill  . acetaminophen (TYLENOL) 500 MG tablet Take 500 mg by mouth every 8 (eight) hours as needed for mild pain or moderate pain.    Marland Kitchen albuterol (PROVENTIL HFA;VENTOLIN HFA) 108 (90 BASE) MCG/ACT inhaler Inhale 2 puffs into the lungs every 6 (six) hours as needed. For shortness of breath    . atenolol (TENORMIN) 50 MG tablet Take 1 tablet (50 mg total) by mouth 2 (two) times daily. 180 tablet 2  . cholecalciferol (VITAMIN D) 1000 units tablet Take 1,000 Units by mouth daily.    . clonazePAM (KLONOPIN) 1 MG tablet Take 1 tablet (1 mg total) by mouth 3 (three) times daily. 90 tablet 2  .  fluticasone (FLONASE) 50 MCG/ACT nasal spray Place 1 spray into both nostrils 2 (two) times daily.    . furosemide (LASIX) 40 MG tablet Take 40 mg DAILY AS NEEDED FOR SWELLING 90 tablet 3  . hydrochlorothiazide (HYDRODIURIL) 25 MG tablet Take 1 tablet (25 mg total) by mouth daily. 90 tablet 3  . HYDROcodone-acetaminophen (NORCO/VICODIN) 5-325 MG tablet Take 1 tablet by mouth every 4 (four) hours as needed. 20 tablet 0  .  losartan (COZAAR) 25 MG tablet Take 1 tablet (25 mg total) by mouth daily. 90 tablet 3  . oxyCODONE-acetaminophen (ROXICET) 5-325 MG tablet Take 1 tablet by mouth every 4 (four) hours as needed for severe pain. 40 tablet 0  . potassium chloride SA (K-DUR,KLOR-CON) 20 MEQ tablet TAKE ONE (1) TABLET EACH DAY 180 tablet 1  . QUEtiapine (SEROQUEL) 100 MG tablet Take 1 tablet (100 mg total) by mouth at bedtime. 30 tablet 2  . Vilazodone HCl (VIIBRYD) 20 MG TABS Take 1 tablet (20 mg total) by mouth daily. 30 tablet 2   No current facility-administered medications for this visit.       Physical Exam  Ht 5\' 2"  (1.575 m)  Wt 206 lb (93.441 kg)  BMI 37.67 kg/m2  Physical Exam  Constitutional: She is oriented to person, place, and time. She appears well-developed and well-nourished. No distress.  Cardiovascular: Normal rate and intact distal pulses.   Musculoskeletal:       Left knee: She exhibits no effusion.  Neurological: She is alert and oriented to person, place, and time. She has normal reflexes. She exhibits normal muscle tone. Coordination normal.  Skin: Skin is warm and dry. No rash noted. She is not diaphoretic. No erythema. No pallor.  Psychiatric: She has a normal mood and affect. Her speech is normal. She is withdrawn. Cognition and memory are impaired.    Right Knee Exam   Comments:  Lateral anterior compartment tenderness without increased pressure   Left Knee Exam   Tenderness  The patient is experiencing tenderness in the lateral joint line.  Range of  Motion  Extension: 0  Flexion: 120   Muscle Strength   The patient has normal left knee strength.  Tests  Drawer:       Anterior - negative     Posterior - negative Varus: negative Valgus: negative  Other  Erythema: absent Scars: absent Sensation: normal Pulse: present Swelling: none Effusion: no effusion present  Comments:  Lateral anterior compartment tenderness without increased pressure      Gait: No abnormalities noted  Data Reviewed No new data  Assessment  Acute exacerbation of left knee arthritis   Plan  Injection left knee joint  Procedure note left knee injection verbal consent was obtained to inject left knee joint  Timeout was completed to confirm the site of injection  The medications used were 40 mg of Depo-Medrol and 1% lidocaine 3 cc  Anesthesia was provided by ethyl chloride and the skin was prepped with alcohol.  After cleaning the skin with alcohol a 20-gauge needle was used to inject the left knee joint. There were no complications. A sterile bandage was applied.    Split cast patient will follow-up for the hand fracture as previously noted for x-ray out of plaster

## 2015-09-13 NOTE — Patient Instructions (Signed)
You have received an injection of steroids into the joint. 15% of patients will have increased pain within the 24 hours postinjection.   This is transient and will go away.   We recommend that you use ice packs on the injection site for 20 minutes every 2 hours and extra strength Tylenol 2 tablets every 8 as needed until the pain resolves.  If you continue to have pain after taking the Tylenol and using the ice please call the office for further instructions.   Keep your previous appt for the hand

## 2015-09-21 ENCOUNTER — Ambulatory Visit (INDEPENDENT_AMBULATORY_CARE_PROVIDER_SITE_OTHER): Payer: Medicare Other | Admitting: Psychiatry

## 2015-09-21 DIAGNOSIS — F330 Major depressive disorder, recurrent, mild: Secondary | ICD-10-CM | POA: Diagnosis not present

## 2015-09-21 NOTE — Progress Notes (Signed)
   THERAPIST PROGRESS NOTE  Session Time:   Thursday 09/21/2015 1:07 PM - 1:58 PM   Participation Level: Active  Behavioral Response: CasualAlertAnxious/depressed/anxious/tearful   Type of Therapy: Individual Therapy  Treatment Goals addressed: Reduce anxiety  Interventions: CBT and Supportive  Summary: [name] is a 70 y.o. female who is a returning patient to this clinician and is resuming services per her psychiatrist Dr. Harrington Challenger' recommendation. Patient has a long-standing history of recurrent periods of depression. She last was seen by this clinician in April 2015. Patient was hospitalized at [facility] from 04/25/2015 through 06/20/2015 due to losing her memory per her report. She says doctors have identified problems with her thyroid as the cause. She is scheduled to have thyroid removed on 10/17/2015. Patient is experiencing depressed mood and significant anxiety. She blames self for losing memory and fears she will lose her memory again. She avoids been around people as she is embarrassed she still cannot remember like she once did and does not function as she has in the past. She is anxious about having surgery and she fears she may die.    Suicidal/Homicidal: No  Therapist Response: Therapist works with patient to review symptoms, identify stressors, ventilate and validate feelings, discuss rationale for and practice controlled breathing   Plan: Return in 2 weeks. Patient agrees to practice controlled breathing daily, complete breathing log, and bring to next session.   Diagnosis: Axis I: MDD    Axis II: Deferred    Mavin Dyke, LCSW 09/21/2015

## 2015-09-21 NOTE — Patient Instructions (Signed)
Discussed orally 

## 2015-09-22 DIAGNOSIS — I10 Essential (primary) hypertension: Secondary | ICD-10-CM | POA: Diagnosis not present

## 2015-09-22 DIAGNOSIS — M810 Age-related osteoporosis without current pathological fracture: Secondary | ICD-10-CM | POA: Diagnosis not present

## 2015-09-22 DIAGNOSIS — M549 Dorsalgia, unspecified: Secondary | ICD-10-CM | POA: Diagnosis not present

## 2015-09-22 DIAGNOSIS — M797 Fibromyalgia: Secondary | ICD-10-CM | POA: Diagnosis not present

## 2015-09-22 DIAGNOSIS — M2530 Other instability, unspecified joint: Secondary | ICD-10-CM | POA: Diagnosis not present

## 2015-09-23 HISTORY — PX: PARATHYROIDECTOMY: SHX19

## 2015-09-25 ENCOUNTER — Ambulatory Visit (INDEPENDENT_AMBULATORY_CARE_PROVIDER_SITE_OTHER): Payer: Medicare Other | Admitting: Adult Health

## 2015-09-25 ENCOUNTER — Encounter: Payer: Self-pay | Admitting: Adult Health

## 2015-09-25 VITALS — BP 138/84 | HR 97 | Ht 62.0 in | Wt 202.0 lb

## 2015-09-25 DIAGNOSIS — I10 Essential (primary) hypertension: Secondary | ICD-10-CM | POA: Diagnosis not present

## 2015-09-25 DIAGNOSIS — R42 Dizziness and giddiness: Secondary | ICD-10-CM | POA: Diagnosis not present

## 2015-09-25 NOTE — Patient Instructions (Signed)
Your physician recommends that you schedule a follow-up appointment in: 3 Months with Jory Sims, NP.   Your physician recommends that you continue on your current medications as directed. Please refer to the Current Medication list given to you today.   Do Not Take Your Lasix and Hydrochlorothiazide on the same day.   Your physician recommends that you return for lab work in: ([facility], [place])   If you need a refill on your cardiac medications before your next appointment, please call your pharmacy.  Thank you for choosing [facility]!

## 2015-09-25 NOTE — Progress Notes (Deleted)
Name: [name]    DOB: 08-20-1945  Age: 70 y.o.  MR#: [id number]       PCP:  Rosita Fire, MD      Insurance: Payor: [employer] MEDICARE / Plan: [facility] MEDICARE / Product Type: *No Product type* /   CC:   No chief complaint on file.   VS Filed Vitals:   09/25/15 1258  BP: 138/84  Pulse: 97  Height: 5\' 2"  (1.575 m)  Weight: 202 lb (91.627 kg)  SpO2: 97%    Weights Current Weight  09/25/15 202 lb (91.627 kg)  09/13/15 206 lb (93.441 kg)  08/23/15 206 lb 9.6 oz (93.713 kg)    Blood Pressure  BP Readings from Last 3 Encounters:  09/25/15 138/84  08/23/15 131/77  08/22/15 155/92     Admit date:  (Not on file) Last encounter with RMR:  Visit date not found   Allergy Elavil; Abilify; Trazodone and nefazodone; Codeine; Latex; Penicillin v; Penicillins; Polyethylene glycol; Sulfonamide derivatives; Cymbalta; and Remeron  Current Outpatient Prescriptions  Medication Sig Dispense Refill  . acetaminophen (TYLENOL) 500 MG tablet Take 500 mg by mouth every 8 (eight) hours as needed for mild pain or moderate pain.    Marland Kitchen albuterol (PROVENTIL HFA;VENTOLIN HFA) 108 (90 BASE) MCG/ACT inhaler Inhale 2 puffs into the lungs every 6 (six) hours as needed. For shortness of breath    . cholecalciferol (VITAMIN D) 1000 units tablet Take 1,000 Units by mouth daily.    . clonazePAM (KLONOPIN) 1 MG tablet Take 1 tablet (1 mg total) by mouth 3 (three) times daily. 90 tablet 2  . fluticasone (FLONASE) 50 MCG/ACT nasal spray Place 1 spray into both nostrils 2 (two) times daily.    . furosemide (LASIX) 40 MG tablet Take 40 mg DAILY AS NEEDED FOR SWELLING 90 tablet 3  . hydrochlorothiazide (HYDRODIURIL) 25 MG tablet Take 1 tablet (25 mg total) by mouth daily. 90 tablet 3  . HYDROcodone-acetaminophen (NORCO/VICODIN) 5-325 MG tablet Take 1 tablet by mouth every 4 (four) hours as needed. 20 tablet 0  . losartan (COZAAR) 25 MG tablet Take 1 tablet (25 mg total) by mouth daily. 90 tablet 3  .  nabumetone (RELAFEN) 500 MG tablet Take 500 mg by mouth 2 (two) times daily.    . potassium chloride SA (K-DUR,KLOR-CON) 20 MEQ tablet TAKE ONE (1) TABLET EACH DAY 180 tablet 1  . QUEtiapine (SEROQUEL) 100 MG tablet Take 1 tablet (100 mg total) by mouth at bedtime. 30 tablet 2  . Vilazodone HCl (VIIBRYD) 20 MG TABS Take 1 tablet (20 mg total) by mouth daily. 30 tablet 2   No current facility-administered medications for this visit.    Discontinued Meds:    Medications Discontinued During This Encounter  Medication Reason  . oxyCODONE-acetaminophen (ROXICET) 5-325 MG tablet Error  . atenolol (TENORMIN) 50 MG tablet Error    Patient Active Problem List   Diagnosis Date Noted  . Abdominal pain 07/26/2015  . Dysphagia   . Schatzki's ring   . Hiatal hernia   . Knee contusion 05/02/2014  . Acquired trigger finger 10/12/2013  . Constipation 07/15/2013  . Abdominal pain, epigastric 07/15/2013  . LLQ pain 07/15/2013  . Lightheaded 01/21/2013  . Elevated LFTs 12/26/2012  . History of benign esophageal tumor   . Adenomatous colon polyp   . [place] DISEASE, LUMBOSACRAL SPINE 05/01/2010  . HYPERLIPIDEMIA 08/22/2009  . THYROID NODULE 08/16/2009  . Anxiety and depression 08/16/2009  . HYPERTENSION 08/16/2009  . Gastroesophageal  reflux disease 08/16/2009  . FIBROMYALGIA 08/16/2009    LABS    Component Value Date/Time   NA 140 07/26/2015 0934   NA 141 05/01/2015 1410   NA 141 10/29/2014 1032   K 4.0 07/26/2015 0934   K 3.9 05/01/2015 1410   K 3.6 10/29/2014 1032   CL 108 07/26/2015 0934   CL 107 05/01/2015 1410   CL 104 10/29/2014 1032   CO2 23 07/26/2015 0934   CO2 26 05/01/2015 1410   CO2 28 10/29/2014 1032   GLUCOSE 96 07/26/2015 0934   GLUCOSE 95 05/01/2015 1410   GLUCOSE 95 10/29/2014 1032   BUN 8 07/26/2015 0934   BUN 10 05/01/2015 1410   BUN 17 10/29/2014 1032   CREATININE 1.03* 07/26/2015 0934   CREATININE 1.13* 05/01/2015 1410   CREATININE 1.10  10/29/2014 1032   CREATININE 1.05 09/27/2014 1129   CREATININE 0.95 04/05/2013 1315   CREATININE 0.79 12/28/2012 0514   CALCIUM 10.7* 07/26/2015 0934   CALCIUM 10.2 05/01/2015 1410   CALCIUM 10.2 10/29/2014 1032   GFRNONAA 56* 07/26/2015 0934   GFRNONAA 48* 05/01/2015 1410   GFRNONAA 61* 04/05/2013 1315   GFRNONAA 85* 12/28/2012 0514   GFRAA 64 07/26/2015 0934   GFRAA 56* 05/01/2015 1410   GFRAA 70* 04/05/2013 1315   GFRAA >90 12/28/2012 0514   CMP     Component Value Date/Time   NA 140 07/26/2015 0934   K 4.0 07/26/2015 0934   CL 108 07/26/2015 0934   CO2 23 07/26/2015 0934   GLUCOSE 96 07/26/2015 0934   BUN 8 07/26/2015 0934   CREATININE 1.03* 07/26/2015 0934   CREATININE 1.13* 05/01/2015 1410   CALCIUM 10.7* 07/26/2015 0934   PROT 6.2 07/26/2015 0934   ALBUMIN 3.9 07/26/2015 0934   AST 15 07/26/2015 0934   ALT 12 07/26/2015 0934   ALKPHOS 48 07/26/2015 0934   BILITOT 0.5 07/26/2015 0934   GFRNONAA 56* 07/26/2015 0934   GFRNONAA 48* 05/01/2015 1410   GFRAA 64 07/26/2015 0934   GFRAA 56* 05/01/2015 1410       Component Value Date/Time   WBC 4.6 05/01/2015 1410   WBC 5.8 09/27/2014 1129   WBC 4.8 04/05/2013 1315   HGB 12.5 05/01/2015 1410   HGB 12.7 09/27/2014 1129   HGB 13.2 04/05/2013 1315   HCT 39.5 05/01/2015 1410   HCT 40.0 09/27/2014 1129   HCT 40.9 04/05/2013 1315   MCV 83.9 05/01/2015 1410   MCV 81.3 09/27/2014 1129   MCV 83.1 04/05/2013 1315    Lipid Panel     Component Value Date/Time   CHOL 173 09/27/2014 1129   TRIG 88 09/27/2014 1129   HDL 61 09/27/2014 1129   CHOLHDL 2.8 09/27/2014 1129   VLDL 18 09/27/2014 1129   LDLCALC 94 09/27/2014 1129    ABG No results found for: PHART, PCO2ART, PO2ART, HCO3, TCO2, ACIDBASEDEF, O2SAT   Lab Results  Component Value Date   TSH 1.145 12/10/2010   BNP (last 3 results) No results for input(s): BNP in the last 8760 hours.  ProBNP (last 3 results) No results for input(s): PROBNP in the last  8760 hours.  Cardiac Panel (last 3 results) No results for input(s): CKTOTAL, CKMB, TROPONINI, RELINDX in the last 72 hours.  Iron/TIBC/Ferritin/ %Sat No results found for: IRON, TIBC, FERRITIN, IRONPCTSAT   EKG Orders placed or performed during the hospital encounter of 07/26/15  . EKG 12-Lead  . EKG 12-Lead  . EKG     Prior  Assessment and Plan Problem List as of 09/25/2015      Cardiovascular and Mediastinum   HYPERTENSION   Last Assessment & Plan 01/21/2013 Office Visit Written 01/21/2013 12:37 PM by Yehuda Savannah, MD    Blood pressure control was somewhat suboptimal in hospital and by patient's determinations at home. Her psychiatrist, Dr. Gilford Rile, recommends prazosin, as he feels it may provide benefit for her restless leg syndrome.  I was not aware of that effect of this drug, but have no objection to trying it as an additional antihypertensive agent.        Respiratory   Hiatal hernia     Digestive   Gastroesophageal reflux disease   Last Assessment & Plan 07/15/2013 Office Visit Written 07/15/2013  2:33 PM by Mahala Menghini, PA-C    Continues to be ongoing issues for her. She has had multiple PPI failure as previously noted. H/O granular cell tumor of the esophagus in 2010. EUS by Dr. Newman Pies in 2012 showed no recurrent tumor. EGD in 2013 showed no recurrent tumor. She saw Dr. Newman Pies in January 2013 for refractory heartburn, EGD and gastric emptying study offered the patient did not want to pursue any further workup at that time. Patient states she to stop the AcipHex and has no change in her symptoms. To discuss further with Dr. Gala Romney. Since she was unable to have a pH/impeded study due to difficult nasal intubation, wonder if she would be a candidate for pH study through [facility] a pH probe placement instead.      Adenomatous colon polyp   Constipation   Dysphagia   Schatzki's ring     Endocrine   THYROID NODULE     Musculoskeletal and Integument   DEGENERATIVE DISC  DISEASE, LUMBOSACRAL SPINE   FIBROMYALGIA   Acquired trigger finger     Other   HYPERLIPIDEMIA   Last Assessment & Plan 01/21/2012 Office Visit Written 01/21/2012 12:16 PM by Yehuda Savannah, MD    Excellent control of hyperlipidemia, especially considering the absence of known vascular disease.  Current treatment with moderate dose lovastatin is quite adequate.      Anxiety and depression   History of benign esophageal tumor   Last Assessment & Plan 11/21/2011 Office Visit Written 11/21/2011  8:55 AM by Andria Meuse, NP    Scheduled for EGD as above      Elevated LFTs   Lightheaded   Last Assessment & Plan 01/21/2013 Office Visit Edited 01/21/2013 12:36 PM by Yehuda Savannah, MD    Patient complains of fairly frequent episodes of lightheadedness. She denies vertigo. Blood pressure medications have been adjusted in the past without benefit. I doubt that there is a cardiac cause for these symptoms, but we'll undertake event recording to verify that arrhythmia is not the cause.  Lightheadedness is of significant concern, since it was associated with her fall and right radial fracture.      Abdominal pain, epigastric   Last Assessment & Plan 07/15/2013 Office Visit Edited 07/15/2013  2:35 PM by Mahala Menghini, PA-C    Ongoing, intermittent epigastric pain. States no better since her gb was removed. ?related to GERD or gastropathy. We will have her check LFTs 6-8 hours after next significant episode to make sure no biliary etiology. Ov with Dr. Gala Romney in six weeks.      LLQ pain   Last Assessment & Plan 07/15/2013 Office Visit Written 07/15/2013  2:30 PM by Mahala Menghini, PA-C    Likely  secondary to constipation. She is up-to-date on her colonoscopy. Not due until 2018. Trial of Linzess 111mcg daily. Keep a stool diary and notations about abdominal pain. Ov in six weeks with Dr. Gala Romney.      Knee contusion   Abdominal pain   Last Assessment & Plan 07/26/2015 Office Visit Written 07/26/2015   9:17 AM by Orvil Feil, NP    70 year old female with lower abdominal pain likely secondary to IBS and RUQ pain of unclear etiology. Change in bowel habits from chronically constipated to alternating constipation and loose stool/incontinence. Query multifactorial but with last colonoscopy in 2013 and history of adenomas, may need to consider early interval colonoscopy. Will trial adding supplemental fiber, Miralax on any given day of constipation, and check ifobt. If positive, proceed with colonoscopy. Will obtain CT scan now as last imaging nearly 3 years ago. CMP also ordered due to vague RUQ pain, and gallbladder is absent. EGD up-to-date. 3 month return.           Imaging: No results found.

## 2015-09-25 NOTE — Progress Notes (Signed)
Cardiology Office Note   Date:  09/25/2015   ID:  [name], DOB 04-Nov-1945, MRN [id number]  PCP:  Rosita Fire, MD  Cardiologist: Cloria Spring, NP   Chief Complaint  Patient presents with  . Hypertension  . Dizziness      History of Present Illness: [name] is a 70 y.o. female who presents for assessment and management of chronic diastolic heart failure, with chronic bilateral edema, hypertension, with history of lightheadedness and dizziness.she was last seen by Dr. Harl Bowie on 08/22/2015.she was started on Lasix, 40 mg daily for chronic lower extremity edema, for hypertension.  She was do to keep a blood pressure log for 7 days and bring with her to return appointment.  She has broken her right forearm and wrist after a fall due to dizziness.  She continues to feel some fatigue.  Her but pressure recordings do reveal variations of blood pressures as low as 100/68 and as high as 150/78.  She still has some dizziness on occasion usually postural.   Past Medical History  Diagnosis Date  . Hypertension   . Hyperlipidemia   . Gastroesophageal reflux disease   . Diverticulosis   . Anxiety and depression   . Adenomatous colon polyp 2006    excised in 2006 & 2010Due surveillance 06/2013  . Chronic back pain   . Thyroid nodule   . Fibromyalgia   . Migraines   . Insomnia   . Chronic constipation   . Asthma   . History of benign esophageal tumor 2010    granular cell esophageal tumor (Dx 06/2008), resected via EMR 2011, due repeat EGD 02/2012  . Chest discomfort   . Hiatal hernia   . Depression   . Anxiety   . Sleep apnea     Stop Bang score of 5. Pt said she was told by Dr. Merlene Laughter that she had "a little bit" of sleep apena, but not bad enough to treat.  . Breast tumor   . Complication of anesthesia   . PONV (postoperative nausea and vomiting)   . Tumor of esophagus   . Psychosis     Past Surgical History  Procedure Laterality Date  . Tonsillectomy     . Abdominal hysterectomy    . Total knee arthroplasty  2002    Right; previous arthroscopic surgery  . Shoulder arthroscopy      Right; bone spurs removed  . Lung biopsy      negative  . 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 Right oophorectomy      benign disease  . Colonoscopy  06/2008, 06/2011    sigmoid tics, tubular adenoma; 2013: anal canal hemorrhoids  . Eus  08/2010    Dr [facility] with EGD. Retained food. No recurrent esophageal lesion, bx negative.  . Colonoscopy  07/10/2011    Anal canal hemorrhoids likely the cause of hematochezia in the setting of constipation; otherwise normal rectum ;submucosal  petechiae in left colon of doubtful clinical significance; otherwise, normal colon  . Esophagogastroduodenoscopy  12/11/2011    [id number] Schatzki's ring; otherwise normal/Small hiatal hernia. Antral and bulbar erosions  . [facility] ph study  12/11/2011    Procedure: BRAVO [place] STUDY;  Surgeon: Daneil Dolin, MD;  Location: [facility];  Service: Endoscopy;  Laterality: N/A;  . Cholecystectomy N/A 04/09/2013    Procedure: LAPAROSCOPIC CHOLECYSTECTOMY;  Surgeon: Jamesetta So, MD;  Location: [facility] ORS;  Service: General;  Laterality: N/A;  . Esophagogastroduodenoscopy (egd) with propofol N/A 05/08/2015    Dr. Gala Romney: mild erosive reflux esophagitis, non-critical Schatzki's ring s/p dilation. Hiatal hernia.   . Esophageal dilation N/A 05/08/2015    Procedure: ESOPHAGEAL DILATION;  Surgeon: Daneil Dolin, MD;  Location: [facility] ORS;  Service: Endoscopy;  Laterality: N/ANathaniel Man     Current Outpatient Prescriptions  Medication Sig Dispense Refill  . acetaminophen (TYLENOL) 500 MG tablet Take 500 mg by mouth every 8 (eight) hours as needed for mild pain or moderate pain.    Marland Kitchen albuterol (PROVENTIL HFA;VENTOLIN HFA) 108 (90 BASE) MCG/ACT inhaler Inhale 2 puffs into the lungs every 6 (six) hours as needed. For shortness of breath    .  cholecalciferol (VITAMIN D) 1000 units tablet Take 1,000 Units by mouth daily.    . clonazePAM (KLONOPIN) 1 MG tablet Take 1 tablet (1 mg total) by mouth 3 (three) times daily. 90 tablet 2  . fluticasone (FLONASE) 50 MCG/ACT nasal spray Place 1 spray into both nostrils 2 (two) times daily.    . furosemide (LASIX) 40 MG tablet Take 40 mg DAILY AS NEEDED FOR SWELLING 90 tablet 3  . hydrochlorothiazide (HYDRODIURIL) 25 MG tablet Take 1 tablet (25 mg total) by mouth daily. 90 tablet 3  . HYDROcodone-acetaminophen (NORCO/VICODIN) 5-325 MG tablet Take 1 tablet by mouth every 4 (four) hours as needed. 20 tablet 0  . losartan (COZAAR) 25 MG tablet Take 1 tablet (25 mg total) by mouth daily. 90 tablet 3  . nabumetone (RELAFEN) 500 MG tablet Take 500 mg by mouth 2 (two) times daily.    . potassium chloride SA (K-DUR,KLOR-CON) 20 MEQ tablet TAKE ONE (1) TABLET EACH DAY 180 tablet 1  . QUEtiapine (SEROQUEL) 100 MG tablet Take 1 tablet (100 mg total) by mouth at bedtime. 30 tablet 2  . Vilazodone HCl (VIIBRYD) 20 MG TABS Take 1 tablet (20 mg total) by mouth daily. 30 tablet 2   No current facility-administered medications for this visit.    Allergies:   Elavil; Abilify; Trazodone and nefazodone; Codeine; Latex; Penicillin v; Penicillins; Polyethylene glycol; Sulfonamide derivatives; Cymbalta; and Remeron    Social History:  The patient  reports that she has never smoked. She has never used smokeless tobacco. She reports that she does not drink alcohol or use illicit drugs.   Family History:  The patient's family history includes Alcohol abuse in her father; Anxiety disorder in her mother; Colon cancer in her paternal aunt and paternal uncle; Dementia in her maternal uncle; Depression in her mother; Heart attack in her mother; Stroke in her father. There is no history of ADD / ADHD, Bipolar disorder, Drug abuse, OCD, Paranoid behavior, Schizophrenia, Seizures, Sexual abuse, or Physical abuse.    ROS: All  other systems are reviewed and negative. Unless otherwise mentioned in H&P    PHYSICAL EXAM: VS:  BP 138/84 mmHg  Pulse 97  Ht 5\' 2"  (1.575 m)  Wt 202 lb (91.627 kg)  BMI 36.94 kg/m2  SpO2 97% , BMI Body mass index is 36.94 kg/(m^2). GEN: Well nourished, well developed, in no acute distress HEENT: normal Neck: no JVD, carotid bruits, or masses Cardiac: RRR;frequent irregular beats, no murmurs, rubs, or gallops,no edema  Respiratory:  clear to auscultation bilaterally, normal work of breathing GI: soft, nontender, nondistended, + BS MS: no deformity or atrophy Skin: warm and dry, no rash Neuro:  Strength and sensation are intact Psych: euthymic mood, full affect   Recent Labs:  10/29/2014: Magnesium 2.1 05/01/2015: Hemoglobin 12.5; Platelets 188 07/26/2015: ALT 12; BUN 8; Creat 1.03*; Potassium 4.0; Sodium 140    Lipid Panel    Component Value Date/Time   CHOL 173 09/27/2014 1129   TRIG 88 09/27/2014 1129   HDL 61 09/27/2014 1129   CHOLHDL 2.8 09/27/2014 1129   VLDL 18 09/27/2014 1129   LDLCALC 94 09/27/2014 1129      Wt Readings from Last 3 Encounters:  09/25/15 202 lb (91.627 kg)  09/13/15 206 lb (93.441 kg)  08/23/15 206 lb 9.6 oz (93.713 kg)     ASSESSMENT AND PLAN:  1. Hypertension: Blood pressure is labile.  I reviewed all of her medications.  The days that she notices that her blood pressure is lower on the days that she takes both HCTZ and Lasix.  I have advised her on the days that she chooses to take Lasix, which she is utilizing when necessary for some edema, she is not to take the HCTZ to prevent dizziness.  She has frequent irregular beats, I will check a BMET, and a magnesium.  We will see her again in 3 months.  2. Dizziness:improving some except for days when she takes Lasix.  Advised that as above.  3. Thyroid disease:she is due to have a nodal removed from her thyroid.  She is cleared to do so from a cardiac standpoint, but will need to have her blood  pressure managed closely, especially concerning use of diuretics.  She will continue the Cozaar.   Current medicines are reviewed at length with the patient today.    Labs/ tests ordered today include: BMET and a magnesium.    Disposition:   FU with 3 months. Signed, Jory Sims, NP  09/25/2015 1:18 PM    [facility] [address] [place], [place] [address] Phone: [phone]; Fax: [phone]

## 2015-09-28 ENCOUNTER — Telehealth: Payer: Self-pay | Admitting: *Deleted

## 2015-09-28 DIAGNOSIS — I10 Essential (primary) hypertension: Secondary | ICD-10-CM

## 2015-09-28 LAB — BASIC METABOLIC PANEL
BUN: 9 mg/dL (ref 7–25)
CO2: 24 mmol/L (ref 20–31)
Calcium: 10.5 mg/dL — ABNORMAL HIGH (ref 8.6–10.4)
Chloride: 104 mmol/L (ref 98–110)
Creat: 0.93 mg/dL (ref 0.50–0.99)
Glucose, Bld: 114 mg/dL — ABNORMAL HIGH (ref 65–99)
Potassium: 3.1 mmol/L — ABNORMAL LOW (ref 3.5–5.3)
Sodium: 141 mmol/L (ref 135–146)

## 2015-09-28 LAB — MAGNESIUM: Magnesium: 1.8 mg/dL (ref 1.5–2.5)

## 2015-09-28 MED ORDER — MAGNESIUM 400 MG PO TABS: 400.0000 mg | ORAL_TABLET | Freq: Every day | ORAL | Status: DC

## 2015-09-28 NOTE — Telephone Encounter (Signed)
Patient notified. Orders placed. °

## 2015-09-28 NOTE — Telephone Encounter (Signed)
-----   Message from Lendon Colonel, NP sent at 09/28/2015  7:07 AM EDT ----- Potassium very low. She is on potassium 20 mEq daily. Increase to 40 meQ BID for 3 days. Add Magnesium 400 mg daily. Repeat BMET in one week

## 2015-09-29 ENCOUNTER — Telehealth: Payer: Self-pay | Admitting: Adult Health

## 2015-09-29 NOTE — Telephone Encounter (Signed)
Patient states that she was supposed to have RX for Potassium sent to pharmacy. Note does not mention this. Please advise. / tg

## 2015-09-29 NOTE — Telephone Encounter (Signed)
Pt is to only increase KCL for 3 days,Tammy at pharmacy made aware

## 2015-10-05 ENCOUNTER — Encounter (HOSPITAL_COMMUNITY): Payer: Self-pay | Admitting: Psychiatry

## 2015-10-05 ENCOUNTER — Ambulatory Visit (INDEPENDENT_AMBULATORY_CARE_PROVIDER_SITE_OTHER): Payer: Medicare Other | Admitting: Psychiatry

## 2015-10-05 DIAGNOSIS — F330 Major depressive disorder, recurrent, mild: Secondary | ICD-10-CM | POA: Diagnosis not present

## 2015-10-05 DIAGNOSIS — I10 Essential (primary) hypertension: Secondary | ICD-10-CM | POA: Diagnosis not present

## 2015-10-05 NOTE — Patient Instructions (Signed)
Discussed orally 

## 2015-10-05 NOTE — Progress Notes (Signed)
   THERAPIST PROGRESS NOTE  Session Time:   Thursday 10/05/2015 3:10 PM -  4:00 PM    Participation Level: Active  Behavioral Response: CasualAlertAnxious/depressed/anxious/tearful   Type of Therapy: Individual Therapy  Treatment Goals addressed: Reduce anxiety  Interventions: CBT and Supportive  Summary: [name] is a 70 y.o. female who is a returning patient to this clinician and is resuming services per her psychiatrist Dr. Harrington Challenger' recommendation. Patient has a long-standing history of recurrent periods of depression. She last was seen by this clinician in April 2015. Patient was hospitalized at [facility] from 04/25/2015 through 06/20/2015 due to losing her memory per her report. She says doctors have identified problems with her thyroid as the cause. She is scheduled to have thyroid removed on 10/17/2015. Patient is experiencing depressed mood and significant anxiety. She blames self for losing memory and fears she will lose her memory again. She avoids been around people as she is embarrassed she still cannot remember like she once did and does not function as she has in the past. She is anxious about having surgery and she fears she may die.  Patient reports continued depression and anxiety since last session but trying to become more involved in activity such as going with her daughter for walks. However, she reports mainly going to medical appointments. She continues to avoid being around people due to fear she may have memory difficulty. She fears this would upset her and calls her to experience issues she experienced in December. She states not knowing what she did but thinking that she did something to cause her to lose her memory. She is less worried about surgery to remove her parathyroid glands on 10/17/2015. However, she expresses fear that something may happen and result in patient being sent to the psychiatric hospital at [facility] after surgery. She reports having a very negative  experience when she was in the psychiatric hospital at [facility] in December as staff did not provide good care per her report.    Suicidal/Homicidal: No  Therapist Response: Reviewed symptoms, facilitated expression of feelings, assisted patient identify coping statements to reduce anxiety, assisted patient identify ways to use support system, encouraged patient to practice controlled breathing   Plan: Return in 2 weeks. Patient agrees to practice controlled breathing daily, complete breathing log, and bring to next session.   Diagnosis: Axis I: MDD    Axis II: Deferred    Sangita Zani, LCSW 10/05/2015

## 2015-10-06 LAB — BASIC METABOLIC PANEL
BUN: 7 mg/dL (ref 7–25)
CO2: 24 mmol/L (ref 20–31)
Calcium: 10.7 mg/dL — ABNORMAL HIGH (ref 8.6–10.4)
Chloride: 109 mmol/L (ref 98–110)
Creat: 0.9 mg/dL (ref 0.50–0.99)
Glucose, Bld: 96 mg/dL (ref 65–99)
Potassium: 3.9 mmol/L (ref 3.5–5.3)
Sodium: 141 mmol/L (ref 135–146)

## 2015-10-09 ENCOUNTER — Ambulatory Visit (INDEPENDENT_AMBULATORY_CARE_PROVIDER_SITE_OTHER): Payer: Medicare Other | Admitting: Orthopedic Surgery

## 2015-10-09 ENCOUNTER — Ambulatory Visit (INDEPENDENT_AMBULATORY_CARE_PROVIDER_SITE_OTHER): Payer: Medicare Other

## 2015-10-09 VITALS — BP 150/86 | HR 94 | Ht 62.0 in | Wt 202.0 lb

## 2015-10-09 DIAGNOSIS — S62609D Fracture of unspecified phalanx of unspecified finger, subsequent encounter for fracture with routine healing: Secondary | ICD-10-CM | POA: Diagnosis not present

## 2015-10-09 DIAGNOSIS — M5410 Radiculopathy, site unspecified: Secondary | ICD-10-CM

## 2015-10-09 MED ORDER — HYDROCODONE-ACETAMINOPHEN 5-325 MG PO TABS: 1.0000 | ORAL_TABLET | ORAL | Status: DC | PRN

## 2015-10-09 NOTE — Patient Instructions (Signed)
Take hydrocadone for the right leg pain   Use walker to ambulate

## 2015-10-09 NOTE — Progress Notes (Signed)
[name], female   DOB: 05-Dec-1945, 70 y.o.   MRN: [id number]  Chief Complaint  Patient presents with  . Follow-up    RIGHT WRIST DOI 08/22/15 and left leg    HPI-Fracture care follow-up the fractures actually at the [facility] joint of the right hand be held from any operative intervention because she's having a metabolic workup for parathyroid disease and has had a biopsy and now is scheduled for surgery. After cast in the fractures basically healed with minimal dorsal displacement.  ROS She complains of back pain left leg pain radiating down her left leg she has multiple problems related to her orthopedic issues  BP 150/86 mmHg  Pulse 94  Ht 5\' 2"  (1.575 m)  Wt 202 lb (91.627 kg)  BMI 36.94 kg/m2  Physical Exam  Constitutional: She is oriented to person, place, and time. She appears well-developed and well-nourished. No distress.  Cardiovascular: Normal rate and intact distal pulses.   Neurological: She is alert and oriented to person, place, and time. She has normal reflexes. She exhibits normal muscle tone. Coordination normal.  Skin: Skin is warm and dry. No rash noted. She is not diaphoretic. No erythema. No pallor.  Psychiatric: She has a normal mood and affect. Her behavior is normal. Judgment and thought content normal.    Ortho Exam  Tenderness over the wrist mainly the ulnar styloid and with extension of the wrist thought to be secondary to casting  Fracture site is nontender. Alignment of the hand fingers normal.  ASSESSMENT AND PLAN   Walker norco for back and leg pain  2 weeks f/u

## 2015-10-10 DIAGNOSIS — S52501A Unspecified fracture of the lower end of right radius, initial encounter for closed fracture: Secondary | ICD-10-CM | POA: Diagnosis not present

## 2015-10-11 DIAGNOSIS — M797 Fibromyalgia: Secondary | ICD-10-CM | POA: Diagnosis not present

## 2015-10-11 DIAGNOSIS — E213 Hyperparathyroidism, unspecified: Secondary | ICD-10-CM | POA: Diagnosis not present

## 2015-10-11 DIAGNOSIS — I10 Essential (primary) hypertension: Secondary | ICD-10-CM | POA: Diagnosis not present

## 2015-10-17 ENCOUNTER — Telehealth (HOSPITAL_COMMUNITY): Payer: Self-pay | Admitting: *Deleted

## 2015-10-17 DIAGNOSIS — Z880 Allergy status to penicillin: Secondary | ICD-10-CM | POA: Diagnosis not present

## 2015-10-17 DIAGNOSIS — E210 Primary hyperparathyroidism: Secondary | ICD-10-CM | POA: Diagnosis not present

## 2015-10-17 DIAGNOSIS — I10 Essential (primary) hypertension: Secondary | ICD-10-CM | POA: Diagnosis not present

## 2015-10-17 DIAGNOSIS — M797 Fibromyalgia: Secondary | ICD-10-CM | POA: Diagnosis not present

## 2015-10-17 DIAGNOSIS — D351 Benign neoplasm of parathyroid gland: Secondary | ICD-10-CM | POA: Diagnosis not present

## 2015-10-19 ENCOUNTER — Ambulatory Visit (HOSPITAL_COMMUNITY): Payer: Self-pay | Admitting: Psychiatry

## 2015-10-23 ENCOUNTER — Ambulatory Visit: Payer: Self-pay | Admitting: Orthopedic Surgery

## 2015-10-23 ENCOUNTER — Ambulatory Visit: Payer: Self-pay | Admitting: Gastroenterology

## 2015-10-24 ENCOUNTER — Encounter (HOSPITAL_COMMUNITY): Payer: Self-pay | Admitting: Psychiatry

## 2015-10-24 ENCOUNTER — Ambulatory Visit (INDEPENDENT_AMBULATORY_CARE_PROVIDER_SITE_OTHER): Payer: Medicare Other | Admitting: Psychiatry

## 2015-10-24 VITALS — BP 153/99 | HR 93 | Ht 62.0 in | Wt 207.2 lb

## 2015-10-24 DIAGNOSIS — F330 Major depressive disorder, recurrent, mild: Secondary | ICD-10-CM

## 2015-10-24 MED ORDER — QUETIAPINE FUMARATE 100 MG PO TABS: 100.0000 mg | ORAL_TABLET | Freq: Every day | ORAL | Status: DC

## 2015-10-24 MED ORDER — CLONAZEPAM 1 MG PO TABS: 1.0000 mg | ORAL_TABLET | Freq: Three times a day (TID) | ORAL | Status: DC

## 2015-10-24 MED ORDER — VILAZODONE HCL 20 MG PO TABS: 20.0000 mg | ORAL_TABLET | Freq: Every day | ORAL | Status: DC

## 2015-10-24 NOTE — Progress Notes (Signed)
Patient ID: ANALLELI Houston, female   DOB: 11/07/1945, 70 y.o.   MRN: [id number] [name], female   DOB: 1945/11/07, 70 y.o.   MRN: [id number] [name], female   DOB: 24-Nov-1945, 70 y.o.   MRN: [id number] [name], female   DOB: [birth year], 70 y.o.   MRN: [id number] [name], female   DOB: Nov 11, 1945, 70 y.o.   MRN: [id number] [name], female   DOB: 09/28/1945, 70 y.o.   MRN: [id number] [name], female   DOB: [birth year], 70 y.o.   MRN: [id number] [name], female   DOB: 10-05-1945, 70 y.o.   MRN: [id number] [name], female   DOB: 1945-06-27, 70 y.o.   MRN: [id number] [name], female   DOB: [birth year], 70 y.o.   MRN: [id number] [name], female   DOB: 07-May-1946, 70 y.o.   MRN: [id number] [name], female   DOB: 1945/11/17, 70 y.o.   MRN: [id number] [name], female   DOB: 12-05-1945, 70 y.o.   MRN: [id number] [name], female   DOB: 03-Nov-1945, 70 y.o.   MRN: [id number] [name], female   DOB: [birth year], 70 y.o.   MRN: [id number] [facility] [id number] Progress Note [name] MRN: [id number] DOB: 1945/08/30 Age: 70 y.o.  Date: 10/24/2015 Start Time: 9:45 AM End Time: 10:10 AM  Chief Complaint: Chief Complaint  Patient presents with  . Depression  . Anxiety  . Paranoid  . Follow-up    Subjective: "I' just got out of the hospital  This patient is a 70 year old black female who lives with her 4 year old daughter who has mild mental retardation. She lives in [place]. She is a retired [employer]. The patient states that she's been depressed "all my life.". She can't give any specific reasons for the depression but notes that at age 29 she was already taking medication to help with sleep. In her 15s her family doctor diagnosed with  schizophrenia although she had no symptoms of paranoia or delusions or hallucinations. She was given medication for this.  Since 1997 she's been coming here with symptoms of depression. She's also had significant problems with fibromyalgia and fatigue. In Neurontin and Ambien have helped. Lately however she's become more depressed and worried. She has had gallbladder surgery and she's concerned about how is going to go. For some reason she's been thinking a lot about death. She doesn't have anyone to stay with her daughter something goes wrong. She's been more negative but is  not suicidal but her energy has dropped. In general she is a very functional person  The patient returns after 4 months. She is accompanied by her sister. She was in [facility] hospital most of last month. Her sister lives in [place] and brought her there after she noticed the patient wasn't making sense see more paranoid and confused and couldn't take care of her basic needs. She stayed in the medical part of the hospital for about 3 weeks and then was transferred to psychiatry. She had a very thorough neurological workup including brain CT MRI and lumbar puncture all of which were noncontributory. It was thought she had a UTI which was treated. She was taken off gabapentin and Ambien. She was tapered off Ativan and went home on no benzodiazepines except enough  for 3 days. All she was in the hospital she was treated with Haldol but this was tapered off as well and she is only been discharged on Viibryd and trazodone to which she is allergic.  The patient is seen after 2 months. She just had a parathyroidectomy at [facility] on April 25. She states that the surgery went well. I looked at the surgical notes and her parathyroid hormone already went down  after surgery. She will have her labs rechecked and a follow-up checkup at [place] this coming Friday. Her potassium and also gotten low and this is being replaced. She states that because her  fourth and fifth finger on her right hand are still broken she cannot really drive without her daughter's help shifting gears. This does not sound very safe and I told her to avoid doing this of much is possible. She states that she's been more confused recently and often doesn't know what day it is and has to have a calendar with her at all times. She states that her mother in numerous siblings of her mother had Alzheimer's dementia. I told her the memory loss may improve once her calcium and parathyroid hormone have normalized we will need to possibly do memory testing if it doesn't. She claims she has not confused about her medications but I question this and will have a tried health nurse recheck on her again  Vitals: BP 153/99 mmHg  Pulse 93  Ht 5\' 2"  (1.575 m)  Wt 207 lb 3.2 oz (93.985 kg)  BMI 37.89 kg/m2  Psychiatric history Patient has a long history of depression.  She has been seeing in this office since May 1997.  Patient denies any history of suicidal attempt however endorse chronic depression and anxiety.  In the past she has taken Prozac but did not like after taking few doses, she also has taken Seroquel, Abilify, Xanax, Pamelor, and amitriptyline.  Patient denies a history of paranoia or delusions however there are times when she has been very depressed and have a lot of negative symptoms.  Alcohol and substance use history Patient denies any history of alcohol or substance use.  Allergies: Allergies  Allergen Reactions  . Elavil [Amitriptyline] Other (See Comments)    Felt really nervous and felt like something was hold her feet or arm and/ or AM headache on it and back on it and went away off it.   . Abilify [Aripiprazole] Other (See Comments)    Dystonic reaction  . Trazodone And Nefazodone Other (See Comments)    Brought back bad dreams of things in the past  . Codeine Hives, Nausea Only and Other (See Comments)    Feels funny  . Latex Hives  . Penicillin V Itching  .  Penicillins Hives    Has patient had a PCN reaction causing immediate rash, facial/tongue/throat swelling, SOB or lightheadedness with hypotension: Yes Has patient had a PCN reaction causing severe rash involving mucus membranes or skin necrosis: No Has patient had a PCN reaction that required hospitalization No Has patient had a PCN reaction occurring within the last 10 years: No If all of the above answers are "NO", then may proceed with Cephalosporin use.   . Polyethylene Glycol Other (See Comments)    Per allergy test  . Sulfonamide Derivatives Nausea And Vomiting  . Cymbalta [Duloxetine Hcl] Rash  . Remeron [Mirtazapine] Other (See Comments)    Caused lots of strange, weird, crazy dreams.   Medical History: Past Medical History  Diagnosis Date  . Hypertension   . Hyperlipidemia   . Gastroesophageal reflux disease   . Diverticulosis   . Anxiety and depression   . Adenomatous colon polyp 2006    excised in 2006 & 2010Due surveillance 06/2013  . Chronic back pain   . Thyroid nodule   . Fibromyalgia   . Migraines   . Insomnia   . Chronic constipation   . Asthma   . History of benign esophageal tumor 2010    granular cell esophageal tumor (Dx 06/2008), resected via EMR 2011, due repeat EGD 02/2012  . Chest discomfort   . Hiatal hernia   . Depression   . Anxiety   . Sleep apnea     Stop Bang score of 5. Pt said she was told by Dr. Merlene Laughter that she had "a little bit" of sleep apena, but not bad enough to treat.  . Breast tumor   . Complication of anesthesia   . PONV (postoperative nausea and vomiting)   . Tumor of esophagus   . Psychosis   Patient has history of hypertension, hyperlipidemia, GERD, diverticulosis, colonic polyp, chronic back pain, fibromyalgia, chronic constipation, benign tumor of esophagus and chronic headache.  Her primary care physician is Dr. Legrand Rams, she sees Dr. Paulita Cradle for chronic pain, fibromyalgia and headache. Her cardiologist is Dr Elvera Maria. Surgical  History: Past Surgical History  Procedure Laterality Date  . Tonsillectomy    . Abdominal hysterectomy    . Total knee arthroplasty  2002    Right; previous arthroscopic surgery  . Shoulder arthroscopy      Right; bone spurs removed  . Lung biopsy      negative  . 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 Right oophorectomy      benign disease  . Colonoscopy  06/2008, 06/2011    sigmoid tics, tubular adenoma; 2013: anal canal hemorrhoids  . Eus  08/2010    Dr [facility] with EGD. Retained food. No recurrent esophageal lesion, bx negative.  . Colonoscopy  07/10/2011    Anal canal hemorrhoids likely the cause of hematochezia in the setting of constipation; otherwise normal rectum ;submucosal  petechiae in left colon of doubtful clinical significance; otherwise, normal colon  . Esophagogastroduodenoscopy  12/11/2011    [id number] Schatzki's ring; otherwise normal/Small hiatal hernia. Antral and bulbar erosions  . Bravo ph study  12/11/2011    Procedure: BRAVO [place] STUDY;  Surgeon: Daneil Dolin, MD;  Location: [facility] SUITE;  Service: Endoscopy;  Laterality: N/A;  . Cholecystectomy N/A 04/09/2013    Procedure: LAPAROSCOPIC CHOLECYSTECTOMY;  Surgeon: Jamesetta So, MD;  Location: [facility] ORS;  Service: General;  Laterality: N/A;  . Esophagogastroduodenoscopy (egd) with propofol N/A 05/08/2015    Dr. Gala Romney: mild erosive reflux esophagitis, non-critical Schatzki's ring s/p dilation. Hiatal hernia.   . Esophageal dilation N/A 05/08/2015    Procedure: ESOPHAGEAL DILATION;  Surgeon: Daneil Dolin, MD;  Location: [facility] ORS;  Service: Endoscopy;  Laterality: N/A;  [name] [age]   Family History: family history includes Alcohol abuse in her father; Anxiety disorder in her mother; Colon cancer in her paternal aunt and paternal uncle; Dementia in her maternal uncle; Depression in her mother; Heart attack in her mother; Stroke in her father. There is no history  of ADD / ADHD, Bipolar disorder, Drug abuse, OCD, Paranoid behavior, Schizophrenia, Seizures, Sexual abuse, or Physical abuse. Reviewed and nothing new today again.  Mental status examination Patient is  casually dressed and fairly groomed. She is calm cooperative and maintained fair eye contact.He has a bandage on her right hand and also on the base of her not She described her mood as is improved but she is still little anxious She still has chronic pain  Her speech is coherent but slow.  Her thought processes clear and logical. She denies suicidal ideation and no active or passive homicidal thoughts e. She denies any auditory or visual hallucination . She denies the paranoid delusions that she had in the past. There no psychotic symptoms present at this time. She's alert and oriented x3. Her insight judgment impulse control is okay her fund of knowledge is fair and her language skills are good. He states that her short-term memory is quite poor  Lab Results:  Results for orders placed or performed in visit on 09/28/15 (from the past 8736 hour(s))  Basic Metabolic Panel (BMET)   Collection Time: 10/05/15  4:13 PM  Result Value Ref Range   Sodium 141 135 - 146 mmol/L   Potassium 3.9 3.5 - 5.3 mmol/L   Chloride 109 98 - 110 mmol/L   CO2 24 20 - 31 mmol/L   Glucose, Bld 96 65 - 99 mg/dL   BUN 7 7 - 25 mg/dL   Creat 0.90 0.50 - 0.99 mg/dL   Calcium 10.7 (H) 8.6 - 10.4 mg/dL  Results for orders placed or performed in visit on 09/25/15 (from the past 8736 hour(s))  Basic Metabolic Panel (BMET)   Collection Time: 09/25/15  3:27 PM  Result Value Ref Range   Sodium 141 135 - 146 mmol/L   Potassium 3.1 (L) 3.5 - 5.3 mmol/L   Chloride 104 98 - 110 mmol/L   CO2 24 20 - 31 mmol/L   Glucose, Bld 114 (H) 65 - 99 mg/dL   BUN 9 7 - 25 mg/dL   Creat 0.93 0.50 - 0.99 mg/dL   Calcium 10.5 (H) 8.6 - 10.4 mg/dL  Magnesium   Collection Time: 09/25/15  3:27 PM  Result Value Ref Range   Magnesium 1.8 1.5 -  2.5 mg/dL  Results for orders placed or performed in visit on 08/31/15 (from the past 8736 hour(s))  IFOBT POC (occult bld, rslt in office)   Collection Time: 08/31/15  4:14 PM  Result Value Ref Range   IFOBT Negative   Results for orders placed or performed in visit on 07/26/15 (from the past 8736 hour(s))  COMPLETE METABOLIC PANEL WITH GFR   Collection Time: 07/26/15  9:34 AM  Result Value Ref Range   Sodium 140 135 - 146 mmol/L   Potassium 4.0 3.5 - 5.3 mmol/L   Chloride 108 98 - 110 mmol/L   CO2 23 20 - 31 mmol/L   Glucose, Bld 96 65 - 99 mg/dL   BUN 8 7 - 25 mg/dL   Creat 1.03 (H) 0.50 - 0.99 mg/dL   Total Bilirubin 0.5 0.2 - 1.2 mg/dL   Alkaline Phosphatase 48 33 - 130 U/L   AST 15 10 - 35 U/L   ALT 12 6 - 29 U/L   Total Protein 6.2 6.1 - 8.1 g/dL   Albumin 3.9 3.6 - 5.1 g/dL   Calcium 10.7 (H) 8.6 - 10.4 mg/dL   GFR, Est African American 64 >=60 mL/min   GFR, Est Non African American 56 (L) >=60 mL/min  Results for orders placed or performed during the hospital encounter of 05/01/15 (from the past 8736 hour(s))  CBC with Differential/Platelet  Collection Time: 05/01/15  2:10 PM  Result Value Ref Range   WBC 4.6 4.0 - 10.5 K/uL   RBC 4.71 3.87 - 5.11 MIL/uL   Hemoglobin 12.5 12.0 - 15.0 g/dL   HCT 39.5 36.0 - 46.0 %   MCV 83.9 78.0 - 100.0 fL   MCH 26.5 26.0 - 34.0 pg   MCHC 31.6 30.0 - 36.0 g/dL   RDW 15.0 11.5 - 15.5 %   Platelets 188 150 - 400 K/uL   Neutrophils Relative % 53 %   Neutro Abs 2.4 1.7 - 7.7 K/uL   Lymphocytes Relative 39 %   Lymphs Abs 1.8 0.7 - 4.0 K/uL   Monocytes Relative 6 %   Monocytes Absolute 0.3 0.1 - 1.0 K/uL   Eosinophils Relative 2 %   Eosinophils Absolute 0.1 0.0 - 0.7 K/uL   Basophils Relative 0 %   Basophils Absolute 0.0 0.0 - 0.1 K/uL  Basic metabolic panel   Collection Time: 05/01/15  2:10 PM  Result Value Ref Range   Sodium 141 135 - 145 mmol/L   Potassium 3.9 3.5 - 5.1 mmol/L   Chloride 107 101 - 111 mmol/L   CO2 26 22 -  32 mmol/L   Glucose, Bld 95 65 - 99 mg/dL   BUN 10 6 - 20 mg/dL   Creatinine, Ser 1.13 (H) 0.44 - 1.00 mg/dL   Calcium 10.2 8.9 - 10.3 mg/dL   GFR calc non Af Amer 48 (L) >60 mL/min   GFR calc Af Amer 56 (L) >60 mL/min   Anion gap 8 5 - 15  Results for orders placed or performed in visit on 04/25/15 (from the past 8736 hour(s))  Clostridium Difficile by PCR   Collection Time: 05/03/15 11:17 AM  Result Value Ref Range   Toxigenic C Difficile by pcr Not Detected Not Detected  Results for orders placed or performed in visit on 09/29/14 (from the past 8736 hour(s))  Basic Metabolic Panel (BMET)   Collection Time: 10/29/14 10:32 AM  Result Value Ref Range   Sodium 141 135 - 145 mEq/L   Potassium 3.6 3.5 - 5.3 mEq/L   Chloride 104 96 - 112 mEq/L   CO2 28 19 - 32 mEq/L   Glucose, Bld 95 70 - 99 mg/dL   BUN 17 6 - 23 mg/dL   Creat 1.10 0.50 - 1.10 mg/dL   Calcium 10.2 8.4 - 10.5 mg/dL  Magnesium   Collection Time: 10/29/14 10:32 AM  Result Value Ref Range   Magnesium 2.1 1.5 - 2.5 mg/dL   Review of systems is positive for bilateral knee pain  Assessment Axis I Maj. depressive disorder Axis II deferred Axis III see medical history Axis IV are to moderate.  Plan: I took her vitals.  I reviewed CC, tobacco/med/surg Hx, meds effects/ side effects, problem list, therapies and responses as well as current situation/symptoms discussed options. She will continue Viibryd 20 mg daily. She will continue clonazepam 1 mg 3 times a day to help with the anxiety and Seroquel 100 mg at bedtime to help with anxiety sleep and confusion/paranoia. She will return in 2 months. I will have a [facility] nurse check on her and make sure she is taking her medications appropriately due to her presumed memory loss  See orders and pt instructions for more details.  MEDICATIONS this encounter: Meds ordered this encounter  Medications  . Vilazodone HCl (VIIBRYD) 20 MG TABS    Sig: Take 1 tablet (20 mg  total)  by mouth daily.    Dispense:  30 tablet    Refill:  2  . QUEtiapine (SEROQUEL) 100 MG tablet    Sig: Take 1 tablet (100 mg total) by mouth at bedtime.    Dispense:  30 tablet    Refill:  2  . clonazePAM (KLONOPIN) 1 MG tablet    Sig: Take 1 tablet (1 mg total) by mouth 3 (three) times daily.    Dispense:  90 tablet    Refill:  2    Medical Decision Making Problem Points:  Established problem, stable/improving (1), New problem, with additional work-up planned (4), Review of last therapy session (1) and Review of psycho-social stressors (1) Data Points:  Review or order clinical lab tests (1) Review of new medications or change in dosage (2)  I certify that outpatient services furnished can reasonably be expected to improve the patient's condition.   Levonne Spiller, MD

## 2015-10-27 DIAGNOSIS — E213 Hyperparathyroidism, unspecified: Secondary | ICD-10-CM | POA: Diagnosis not present

## 2015-11-01 ENCOUNTER — Ambulatory Visit (INDEPENDENT_AMBULATORY_CARE_PROVIDER_SITE_OTHER): Payer: Medicare Other | Admitting: Psychiatry

## 2015-11-01 ENCOUNTER — Encounter (HOSPITAL_COMMUNITY): Payer: Self-pay | Admitting: Psychiatry

## 2015-11-01 DIAGNOSIS — F330 Major depressive disorder, recurrent, mild: Secondary | ICD-10-CM | POA: Diagnosis not present

## 2015-11-01 NOTE — Patient Instructions (Signed)
Discussed orally 

## 2015-11-01 NOTE — Progress Notes (Signed)
   THERAPIST PROGRESS NOTE  Session Time:   Wednesday 11/01/2015 2:15 PM -3:05 PM       Participation Level: Active  Behavioral Response: CasualAlertAnxious/depressed/anxious/tearful   Type of Therapy: Individual Therapy  Treatment Goals addressed: Reduce anxiety  Interventions: CBT and Supportive  Summary: [name] is a 70 y.o. female who is a returning patient to this clinician and is resuming services per her psychiatrist Dr. Harrington Challenger' recommendation. Patient has a long-standing history of recurrent periods of depression. She last was seen by this clinician in April 2015. Patient was hospitalized at [facility] from 04/25/2015 through 06/20/2015 due to losing her memory per her report. She says doctors have identified problems with her thyroid as the cause. She is scheduled to have thyroid removed on 10/17/2015. Patient is experiencing depressed mood and significant anxiety. She blames self for losing memory and fears she will lose her memory again. She avoids been around people as she is embarrassed she still cannot remember like she once did and does not function as she has in the past. She is anxious about having surgery and she fears she may die.  Patient reports continued depression and anxiety since last session. She is pleased that surgery to remove her parathyroid glands on 10/17/2015 went well.  She expresses frustration she continues to experience memory difficulty and her physical activity remains very limited. She also expresses sadness she is unable to care for self and do various other activities as she has in the past. She expresses feelings of hopelessness and helplessness but denies any suicidal ideations. She has been practicing controlled breathing daily and reports this has been helpful.     Suicidal/Homicidal: No  Therapist Response: Reviewed symptoms, assisted patient identify and verbalize feelings related to grief/loss issues regarding her changed cognitive and  physical functioning, praise and reinforced patient's use of controlled breathing, assisted patient identify activities within her capability to pursue to improve structure and daily routine    Plan: Return in 2 weeks. Patient agrees to practice controlled breathing daily and pursue activities discussed in session  Diagnosis: Axis I: MDD    Axis II: Deferred    BYNUM,PEGGY, LCSW 11/01/2015

## 2015-11-02 DIAGNOSIS — M810 Age-related osteoporosis without current pathological fracture: Secondary | ICD-10-CM | POA: Diagnosis not present

## 2015-11-02 DIAGNOSIS — Z8639 Personal history of other endocrine, nutritional and metabolic disease: Secondary | ICD-10-CM | POA: Diagnosis not present

## 2015-11-07 ENCOUNTER — Ambulatory Visit (INDEPENDENT_AMBULATORY_CARE_PROVIDER_SITE_OTHER): Payer: Medicare Other | Admitting: Gastroenterology

## 2015-11-07 ENCOUNTER — Encounter: Payer: Self-pay | Admitting: Gastroenterology

## 2015-11-07 VITALS — BP 159/87 | HR 76 | Temp 97.6°F | Ht 65.0 in | Wt 205.0 lb

## 2015-11-07 DIAGNOSIS — R109 Unspecified abdominal pain: Secondary | ICD-10-CM | POA: Diagnosis not present

## 2015-11-07 DIAGNOSIS — R350 Frequency of micturition: Secondary | ICD-10-CM | POA: Insufficient documentation

## 2015-11-07 DIAGNOSIS — K5900 Constipation, unspecified: Secondary | ICD-10-CM

## 2015-11-07 MED ORDER — LINACLOTIDE 72 MCG PO CAPS: 72.0000 ug | ORAL_CAPSULE | Freq: Every day | ORAL | Status: DC

## 2015-11-07 NOTE — Patient Instructions (Signed)
Please have urinalysis done.   I have given you Linzess to take once each morning, 30 minutes before breakfast. We will try to get this approved through your insurance company.

## 2015-11-07 NOTE — Progress Notes (Signed)
Referring Provider: Rosita Fire, MD Primary Care Physician:  Donna Fire, MD  Primary GI: Dr. Gala Houston   Chief Complaint  Patient presents with  . Follow-up    HPI:   [name] is a 70 y.o. female presenting today with a remote history of granular cell tumor of the esophagus with most recent EGD in Nov 2016 with erosive reflux esophagitis, non-critical Schatzki's ring s/p dilation. At last appt in Feb 2017, had vague abdominal pain, alternating constipation/diarrhea, some fecal incontinence, with normal CT and LFTs. Ifobt negative. Next colonoscopy due 2018.   Bowel habits "a little bit better". When taking Miralax, sometimes works every day. Sometimes takes a few days then will have an accident. Having fecal incontinence, states she will be walking and a "turd will fall out on the floor". No rectal bleeding. Taking Miralax every other day. Linzess too expensive. Amitiza gave cramping. Oranges run right through her.   Usually has pain on right side in the mornings. Laying on left side worsens it. Located mid-abdomen. Notes urinary frequency, some burning.    Underwent parathyroidectomy in April 2017, with final pathology revealing a parathyroid adenoma.   Past Medical History  Diagnosis Date  . Hypertension   . Hyperlipidemia   . Gastroesophageal reflux disease   . Diverticulosis   . Anxiety and depression   . Adenomatous colon polyp 2006    excised in 2006 & 2010Due surveillance 06/2013  . Chronic back pain   . Thyroid nodule   . Fibromyalgia   . Migraines   . Insomnia   . Chronic constipation   . Asthma   . History of benign esophageal tumor 2010    granular cell esophageal tumor (Dx 06/2008), resected via EMR 2011, due repeat EGD 02/2012  . Chest discomfort   . Hiatal hernia   . Depression   . Anxiety   . Sleep apnea     Stop Bang score of 5. Pt said she was told by Dr. Merlene Houston that she had "a little bit" of sleep apena, but not bad enough to treat.  . Breast  tumor   . Complication of anesthesia   . PONV (postoperative nausea and vomiting)   . Tumor of esophagus   . Psychosis     Past Surgical History  Procedure Laterality Date  . Tonsillectomy    . Abdominal hysterectomy    . Total knee arthroplasty  2002    Right; previous arthroscopic surgery  . Shoulder arthroscopy      Right; bone spurs removed  . Lung biopsy      negative  . 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 Right oophorectomy      benign disease  . Colonoscopy  06/2008, 06/2011    sigmoid tics, tubular adenoma; 2013: anal canal hemorrhoids  . Eus  08/2010    Dr [facility] with EGD. Retained food. No recurrent esophageal lesion, bx negative.  . Colonoscopy  07/10/2011    Anal canal hemorrhoids likely the cause of hematochezia in the setting of constipation; otherwise normal rectum ;submucosal  petechiae in left colon of doubtful clinical significance; otherwise, normal colon  . Esophagogastroduodenoscopy  12/11/2011    [id number] Schatzki's ring; otherwise normal/Small hiatal hernia. Antral and bulbar erosions  . Bravo ph study  12/11/2011    Procedure: BRAVO [place] STUDY;  Surgeon: Donna Dolin, MD;  Location: [facility];  Service: Endoscopy;  Laterality: N/A;  . Cholecystectomy N/A 04/09/2013  Procedure: LAPAROSCOPIC CHOLECYSTECTOMY;  Surgeon: Jamesetta So, MD;  Location: [facility] ORS;  Service: General;  Laterality: N/A;  . Esophagogastroduodenoscopy (egd) with propofol N/A 05/08/2015    Dr. Gala Houston: mild erosive reflux esophagitis, non-critical Schatzki's ring s/p dilation. Hiatal hernia.   . Esophageal dilation N/A 05/08/2015    Procedure: ESOPHAGEAL DILATION;  Surgeon: Donna Dolin, MD;  Location: [facility];  Service: Endoscopy;  Laterality: N/AVenia Houston 54/56  . Parathyroidectomy  April 2017    [name].     Current Outpatient Prescriptions  Medication Sig Dispense Refill  . acetaminophen (TYLENOL) 500 MG tablet Take 500  mg by mouth every 8 (eight) hours as needed for mild pain or moderate pain.    Marland Kitchen albuterol (PROVENTIL HFA;VENTOLIN HFA) 108 (90 BASE) MCG/ACT inhaler Inhale 2 puffs into the lungs every 6 (six) hours as needed. For shortness of breath    . cholecalciferol (VITAMIN D) 1000 units tablet Take 1,000 Units by mouth daily. Reported on 10/24/2015    . clonazePAM (KLONOPIN) 1 MG tablet Take 1 tablet (1 mg total) by mouth 3 (three) times daily. 90 tablet 2  . fluticasone (FLONASE) 50 MCG/ACT nasal spray Place 1 spray into both nostrils 2 (two) times daily.    . furosemide (LASIX) 40 MG tablet Take 40 mg DAILY AS NEEDED FOR SWELLING 90 tablet 3  . hydrochlorothiazide (HYDRODIURIL) 25 MG tablet Take 1 tablet (25 mg total) by mouth daily. 90 tablet 3  . loratadine (CLARITIN) 10 MG tablet Take 10 mg by mouth daily.    Marland Kitchen losartan (COZAAR) 25 MG tablet Take 1 tablet (25 mg total) by mouth daily. 90 tablet 3  . Magnesium 400 MG TABS Take 400 mg by mouth daily. 30 tablet 6  . potassium chloride SA (K-DUR,KLOR-CON) 20 MEQ tablet TAKE ONE (1) TABLET EACH DAY (Patient taking differently: TAKE SEVEN TABLET DAILY) 180 tablet 1  . QUEtiapine (SEROQUEL) 100 MG tablet Take 1 tablet (100 mg total) by mouth at bedtime. 30 tablet 2  . Vilazodone HCl (VIIBRYD) 20 MG TABS Take 1 tablet (20 mg total) by mouth daily. 30 tablet 2  . HYDROcodone-acetaminophen (NORCO/VICODIN) 5-325 MG tablet Take 1 tablet by mouth every 4 (four) hours as needed. (Patient not taking: Reported on 11/07/2015) 42 tablet 0  . linaclotide (LINZESS) 72 MCG capsule Take 1 capsule (72 mcg total) by mouth daily before breakfast. 90 capsule 5  . nabumetone (RELAFEN) 500 MG tablet Take 500 mg by mouth 2 (two) times daily. Reported on 11/07/2015     No current facility-administered medications for this visit.    Allergies as of 11/07/2015 - Review Complete 11/07/2015  Allergen Reaction Noted  . Elavil [amitriptyline] Other (See Comments) 06/03/2012  . Abilify  [aripiprazole] Other (See Comments) 11/25/2012  . Trazodone and nefazodone Other (See Comments) 08/31/2012  . Codeine Hives, Nausea Only, and Other (See Comments)   . Latex Hives 11/21/2011  . Penicillin v Itching 08/02/2013  . Penicillins Hives   . Polyethylene glycol Other (See Comments) 12/26/2012  . Sulfonamide derivatives Nausea And Vomiting   . Cymbalta [duloxetine hcl] Rash 03/10/2013  . Remeron [mirtazapine] Other (See Comments) 05/06/2012    Family History  Problem Relation Age of Onset  . Stroke Father   . Alcohol abuse Father   . Heart attack Mother   . Depression Mother   . Anxiety disorder Mother   . Colon cancer Paternal Aunt   . Colon cancer Paternal Uncle   . Dementia Maternal Uncle   .  ADD / ADHD Neg Hx   . Bipolar disorder Neg Hx   . Drug abuse Neg Hx   . OCD Neg Hx   . Paranoid behavior Neg Hx   . Schizophrenia Neg Hx   . Seizures Neg Hx   . Sexual abuse Neg Hx   . Physical abuse Neg Hx     Social History   Social History  . Marital Status: Single    Spouse Name: N/A  . Number of Children: 1  . Years of Education: N/A   Occupational History  . disabled    Social History Main Topics  . Smoking status: Never Smoker   . Smokeless tobacco: Never Used  . Alcohol Use: No  . Drug Use: No  . Sexual Activity: No   Other Topics Concern  . None   Social History Narrative    Review of Systems: As mentioned in HPI   Physical Exam: BP 159/87 mmHg  Pulse 76  Temp(Src) 97.6 F (36.4 C) (Oral)  Ht 5\' 5"  (1.651 m)  Wt 205 lb (92.987 kg)  BMI 34.11 kg/m2 General:   Alert and oriented. No distress noted. Pleasant and cooperative.  Head:  Normocephalic and atraumatic. Eyes:  Conjuctiva clear without scleral icterus. Mouth:  Oral mucosa pink and moist. Good dentition. No lesions. Abdomen:  +BS, soft, non-tender and non-distended. No rebound or guarding. No HSM or masses noted. Msk:  Symmetrical without gross deformities. Normal  posture. Extremities:  Without edema. Neurologic:  Alert and  oriented x4;  grossly normal neurologically. Psych:  Alert and cooperative. Normal mood and affect.  CT Feb 2017:  Moderate stool, no acute findings.   Lab Results  Component Value Date   ALT 12 07/26/2015   AST 15 07/26/2015   ALKPHOS 48 07/26/2015   BILITOT 0.5 07/26/2015

## 2015-11-08 LAB — URINALYSIS W MICROSCOPIC + REFLEX CULTURE
Bacteria, UA: NONE SEEN [HPF]
Bilirubin Urine: NEGATIVE
Casts: NONE SEEN [LPF]
Crystals: NONE SEEN [HPF]
Glucose, UA: NEGATIVE
Hgb urine dipstick: NEGATIVE
Ketones, ur: NEGATIVE
Nitrite: NEGATIVE
Protein, ur: NEGATIVE
Specific Gravity, Urine: 1.005 (ref 1.001–1.035)
Yeast: NONE SEEN [HPF]
pH: 6 (ref 5.0–8.0)

## 2015-11-09 ENCOUNTER — Ambulatory Visit (INDEPENDENT_AMBULATORY_CARE_PROVIDER_SITE_OTHER): Payer: Medicare Other | Admitting: Orthopedic Surgery

## 2015-11-09 ENCOUNTER — Ambulatory Visit: Payer: Self-pay | Admitting: Orthopedic Surgery

## 2015-11-09 VITALS — BP 190/103 | HR 94 | Ht 65.0 in | Wt 206.2 lb

## 2015-11-09 DIAGNOSIS — R109 Unspecified abdominal pain: Secondary | ICD-10-CM | POA: Diagnosis not present

## 2015-11-09 DIAGNOSIS — M5410 Radiculopathy, site unspecified: Secondary | ICD-10-CM

## 2015-11-09 DIAGNOSIS — R52 Pain, unspecified: Secondary | ICD-10-CM | POA: Diagnosis not present

## 2015-11-09 DIAGNOSIS — M7050 Other bursitis of knee, unspecified knee: Secondary | ICD-10-CM

## 2015-11-09 DIAGNOSIS — M1712 Unilateral primary osteoarthritis, left knee: Secondary | ICD-10-CM | POA: Diagnosis not present

## 2015-11-09 DIAGNOSIS — E039 Hypothyroidism, unspecified: Secondary | ICD-10-CM | POA: Diagnosis not present

## 2015-11-09 DIAGNOSIS — M48061 Spinal stenosis, lumbar region without neurogenic claudication: Secondary | ICD-10-CM

## 2015-11-09 DIAGNOSIS — G8929 Other chronic pain: Secondary | ICD-10-CM

## 2015-11-09 DIAGNOSIS — M4806 Spinal stenosis, lumbar region: Secondary | ICD-10-CM

## 2015-11-09 DIAGNOSIS — M7150 Other bursitis, not elsewhere classified, unspecified site: Secondary | ICD-10-CM

## 2015-11-09 DIAGNOSIS — E876 Hypokalemia: Secondary | ICD-10-CM | POA: Diagnosis not present

## 2015-11-09 DIAGNOSIS — I10 Essential (primary) hypertension: Secondary | ICD-10-CM | POA: Diagnosis not present

## 2015-11-09 LAB — URINE CULTURE
Colony Count: NO GROWTH
Organism ID, Bacteria: NO GROWTH

## 2015-11-09 MED ORDER — HYDROCODONE-ACETAMINOPHEN 5-325 MG PO TABS: 1.0000 | ORAL_TABLET | Freq: Four times a day (QID) | ORAL | Status: DC | PRN

## 2015-11-09 NOTE — Assessment & Plan Note (Signed)
Remains an issue, not ideally managed with Miralax. Amitiza not tolerated due to cramping, and Linzess was reportedly too expensive; however, I feel perhaps she needed a prior authorization. Will trial Linzess 72 mcg once daily and see if this can be approved through insurance. Occasional fecal incontinence, new onset. Discussed more aggressive management of constipation, with consideration of colonoscopy if no improvement. She is hesitant to pursue this currently. Due for surveillance colonoscopy next year. Will trial Linzess 72 and have close follow-up in future.

## 2015-11-09 NOTE — Assessment & Plan Note (Signed)
With foul odor. Check UA with culture reflex.

## 2015-11-09 NOTE — Progress Notes (Signed)
Quick Note:  Pt is aware. ______ 

## 2015-11-09 NOTE — Progress Notes (Signed)
[name], female   DOB: 04-23-1946, 70 y.o.   MRN: [id number]  Chief Complaint  Patient presents with  . Follow-up    back, right leg and knee   Encounter Diagnoses  Name Primary?  . Radicular leg pain Yes  . Primary osteoarthritis of left knee   . Pes anserinus bursitis   . Chronic generalized pain   . Spinal stenosis of lumbar region      HPI LEG PAIN STABLE,LEFT KNEE WEAKNEES, GIVING WAY,MEDIAL PAIN;PES BURSA PAIN RIGHT KNEE,BODY ACHES MILD;BACL LOWER BACK PAIN W/O RADIATION  Review of Systems  Constitutional: Negative for fever, chills, weight loss and malaise/fatigue.  Musculoskeletal: Positive for myalgias and back pain. Negative for falls.  Neurological: Negative for tingling.  Psychiatric/Behavioral: Positive for memory loss.    Past Medical History  Diagnosis Date  . Hypertension   . Hyperlipidemia   . Gastroesophageal reflux disease   . Diverticulosis   . Anxiety and depression   . Adenomatous colon polyp 2006    excised in 2006 & 2010Due surveillance 06/2013  . Chronic back pain   . Thyroid nodule   . Fibromyalgia   . Migraines   . Insomnia   . Chronic constipation   . Asthma   . History of benign esophageal tumor 2010    granular cell esophageal tumor (Dx 06/2008), resected via EMR 2011, due repeat EGD 02/2012  . Chest discomfort   . Hiatal hernia   . Depression   . Anxiety   . Sleep apnea     Stop Bang score of 5. Pt said she was told by Dr. Merlene Laughter that she had "a little bit" of sleep apena, but not bad enough to treat.  . Breast tumor   . Complication of anesthesia   . PONV (postoperative nausea and vomiting)   . Tumor of esophagus   . Psychosis     BP 190/103 mmHg  Pulse 94  Ht 5\' 5"  (1.651 m)  Wt 206 lb 3.2 oz (93.532 kg)  BMI 34.31 kg/m2  Physical Exam Physical Exam  Constitutional: The patient appears well-developed and well-nourished. No distress.  The patient is oriented to person, place, and time.  Psychiatric:  The patient has a normal mood and affect.  Cardiovascular: Intact distal pulses. BOTH LEGS Neurological: sensation is normal BOTH LEGS Skin: Skin is warm and dry. No rash noted. The patient is not diaphoretic. No erythema. No pallor.    Right Knee Exam   Tenderness  The patient is experiencing tenderness in the pes anserinus.  Range of Motion  Extension: abnormal  Flexion: 110   Muscle Strength   The patient has normal right knee strength.  Tests  Drawer:       Anterior - negative    Posterior - negative Varus: negative Valgus: negative  Other  Erythema: absent Scars: absent Sensation: normal Pulse: present Swelling: none   Left Knee Exam   Tenderness  The patient is experiencing tenderness in the medial joint line.  Range of Motion  Extension: abnormal  Flexion: 110   Muscle Strength   The patient has normal left knee strength.  Tests  Drawer:       Anterior - negative     Posterior - negative Varus: negative Valgus: negative  Other  Erythema: absent Scars: absent Sensation: normal Pulse: present Swelling: none   Back Exam   Tenderness  The patient is experiencing tenderness in the lumbar.  Muscle Strength  The patient has normal back  strength.  Tests  Straight leg raise right: negative Straight leg raise left: negative  Other  Gait: normal  Erythema: no back redness Scars: absent        ASSESSMENT AND PLAN   Procedure note left knee injection verbal consent was obtained to inject left knee joint  Timeout was completed to confirm the site of injection  The medications used were 40 mg of Depo-Medrol and 1% lidocaine 3 cc  Anesthesia was provided by ethyl chloride and the skin was prepped with alcohol.  After cleaning the skin with alcohol a 20-gauge needle was used to inject the left knee joint. There were no complications. A sterile bandage was applied.   Procedure note right knee injection verbal consent was obtained to  inject right knee joint  Timeout was completed to confirm the site of injection  The medications used were 40 mg of Depo-Medrol and 1% lidocaine 3 cc  Anesthesia was provided by ethyl chloride and the skin was prepped with alcohol.  After cleaning the skin with alcohol a 20-gauge needle was used to inject the right knee joint. There were no complications. A sterile bandage was applied.  Encounter Diagnoses  Name Primary?  . Radicular leg pain Yes  . Primary osteoarthritis of left knee   . Pes anserinus bursitis   . Chronic generalized pain   . Spinal stenosis of lumbar region

## 2015-11-09 NOTE — Patient Instructions (Addendum)
Please be advised. You are on a medication which is classified as an "opiod". The CDC has recently advised all providers to advise patient's that these medications have certain risks which include but are not limited to:    drug intolerance  drug addiction  respiratory depression   respiratory failure  Death  Please keep these medications locked away. If you feel that you are becoming addicted to these medicines or you are having difficulties with these medications please alert your provider.   You have received an injection of steroids into the joint. 15% of patients will have increased pain within the 24 hours postinjection.   This is transient and will go away.   We recommend that you use ice packs on the injection site for 20 minutes every 2 hours and extra strength Tylenol 2 tablets every 8 as needed until the pain resolves.  If you continue to have pain after taking the Tylenol and using the ice please call the office for further instructions.

## 2015-11-09 NOTE — Assessment & Plan Note (Signed)
Right-sided abdominal pain that appears musculoskeletal in nature. LFTs and CT normal. Gallbladder absent. Likely stool burden playing a role in setting of constipation.

## 2015-11-09 NOTE — Progress Notes (Signed)
Quick Note:  UA looks contaminated, culture no growth. Follow-up with PCP if persistent urinary symptoms.  Please have her return in 3 months. Routing to [place] in [employer] absence.   ______

## 2015-11-13 NOTE — Progress Notes (Signed)
CC'D TO PCP °

## 2015-11-23 ENCOUNTER — Ambulatory Visit (INDEPENDENT_AMBULATORY_CARE_PROVIDER_SITE_OTHER): Payer: Medicare Other | Admitting: Psychiatry

## 2015-11-23 ENCOUNTER — Encounter (HOSPITAL_COMMUNITY): Payer: Self-pay | Admitting: Psychiatry

## 2015-11-23 DIAGNOSIS — F330 Major depressive disorder, recurrent, mild: Secondary | ICD-10-CM | POA: Diagnosis not present

## 2015-11-23 NOTE — Patient Instructions (Signed)
Discussed orally 

## 2015-11-23 NOTE — Progress Notes (Signed)
    THERAPIST PROGRESS NOTE  Session Time:   Thursday 11/23/2015 9:08 AM - 10:02 AM    Participation Level: Active  Behavioral Response: CasualAlertAnxious/depressed/anxious/tearful   Type of Therapy: Individual Therapy  Treatment Goals :    1. Process grief and loss issues related to changed functioning and memory difficulty.      2. Use mindfulness and acceptance strategies to reduce experimental avoidance and increase value-based behavior.  Treatment Goals addressed:         1,2  Interventions: CBT and Supportive  Summary: [name] is a 70 y.o. female who is a returning patient to this clinician and is resuming services per her psychiatrist Dr. Harrington Challenger' recommendation. Patient has a long-standing history of recurrent periods of depression. She last was seen by this clinician in April 2015. Patient was hospitalized at [facility] from 04/25/2015 through 06/20/2015 due to losing her memory per her report. She says doctors have identified problems with her thyroid as the cause. She is scheduled to have thyroid removed on 10/17/2015. Patient is experiencing depressed mood and significant anxiety. She blames self for losing memory and fears she will lose her memory again. She avoids been around people as she is embarrassed she still cannot remember like she once did and does not function as she has in the past. She is anxious about having surgery and she fears she may die.  Patient reports continued depression and anxiety since last session. She expresses continued frustration regarding memory difficulty and her changed functioning. She did attend church but became very upset because she was unable to take notes due to problems with her memory. She also reports having difficulty spelling, a problem that she did not have before the incident she experienced last year. She has continued to practice controlled breathing but says she has not been doing this daily as she forgets.      Suicidal/Homicidal: No  Therapist Response: Reviewed symptoms, facilitated expression of feelings, assisted patient  Identify strengths and  support system, developed treatment plan,  assisted patient identify activities within her capability to pursue to improve structure and daily routine    Plan: Return in 2 weeks.   Diagnosis: Axis I: MDD    Axis II: Deferred    [name], LCSW 11/23/2015

## 2015-12-07 ENCOUNTER — Ambulatory Visit (INDEPENDENT_AMBULATORY_CARE_PROVIDER_SITE_OTHER): Payer: Medicare Other | Admitting: Psychiatry

## 2015-12-07 ENCOUNTER — Encounter (HOSPITAL_COMMUNITY): Payer: Self-pay | Admitting: Psychiatry

## 2015-12-07 DIAGNOSIS — F330 Major depressive disorder, recurrent, mild: Secondary | ICD-10-CM

## 2015-12-07 NOTE — Progress Notes (Signed)
     THERAPIST PROGRESS NOTE  Session Time:   Thursday 12/07/2015 8:58 AM -  9:50 AM       Participation Level: Active  Behavioral Response: CasualAlertAnxious/ less depressed/anxious/tearful   Type of Therapy: Individual Therapy  Treatment Goals :    1. Process grief and loss issues related to changed functioning and memory difficulty.      2. Use mindfulness and acceptance strategies to reduce experimental avoidance and increase value-based behavior.  Treatment Goals addressed:         1,2  Interventions: CBT and Supportive  Summary: [name] is a 70 y.o. female who is a returning patient to this clinician and is resuming services per her psychiatrist Dr. Harrington Challenger' recommendation. Patient has a long-standing history of recurrent periods of depression. She last was seen by this clinician in April 2015. Patient was hospitalized at [facility] from 04/25/2015 through 06/20/2015 due to losing her memory per her report. She says doctors have identified problems with her thyroid as the cause. She is scheduled to have thyroid removed on 10/17/2015. Patient is experiencing depressed mood and significant anxiety. She blames self for losing memory and fears she will lose her memory again. She avoids been around people as she is embarrassed she still cannot remember like she once did and does not function as she has in the past. She is anxious about having surgery and she fears she may die.  Patient continues to experience symptoms of depression but reports less depressed mood and states having up and down days since last session.. She reports increased involvement in activity including walking to the mailbox, attending church, and performing light household tasks. 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 She expresses frustration and sadness but acceptance of no longer being able to perform the duties and  says she is going to resign from that position. She also continues to express frustration and sadness about her changed functioning especially related to taking care of her personal finances. She expresses frustration her sister now has to manage patient's business affairs but does not consult with patient or keep her abreast of information regarding patient's bills.       Suicidal/Homicidal: No  Therapist Response: Reviewed symptoms, facilitated expression of feelings, praised  and reinforced patient's increased involvement in activity,  assisted patient identify her values regarding assisting at church and other ways in which she can pursue her values regarding helping at church, assisted patient identify ways her life has changed since incident regarding memory difficulty, assisted patient identify ways to improve assertive communication in the relationship with her sister regarding taking care of patients finances   Plan: Return in 2 weeks.   Diagnosis: Axis I: MDD    Axis II: Deferred    Hewitt Garner, LCSW 12/07/2015

## 2015-12-07 NOTE — Patient Instructions (Signed)
Discussed orally 

## 2015-12-11 DIAGNOSIS — N390 Urinary tract infection, site not specified: Secondary | ICD-10-CM | POA: Diagnosis not present

## 2015-12-11 DIAGNOSIS — I10 Essential (primary) hypertension: Secondary | ICD-10-CM | POA: Diagnosis not present

## 2015-12-11 DIAGNOSIS — F329 Major depressive disorder, single episode, unspecified: Secondary | ICD-10-CM | POA: Diagnosis not present

## 2015-12-11 DIAGNOSIS — F0150 Vascular dementia without behavioral disturbance: Secondary | ICD-10-CM | POA: Diagnosis not present

## 2015-12-18 DIAGNOSIS — I10 Essential (primary) hypertension: Secondary | ICD-10-CM | POA: Diagnosis not present

## 2015-12-19 ENCOUNTER — Encounter (HOSPITAL_COMMUNITY): Payer: Self-pay | Admitting: Psychiatry

## 2015-12-19 ENCOUNTER — Ambulatory Visit (INDEPENDENT_AMBULATORY_CARE_PROVIDER_SITE_OTHER): Payer: Medicare Other | Admitting: Psychiatry

## 2015-12-19 VITALS — BP 143/107 | HR 63 | Ht 62.0 in | Wt 218.0 lb

## 2015-12-19 DIAGNOSIS — F330 Major depressive disorder, recurrent, mild: Secondary | ICD-10-CM | POA: Diagnosis not present

## 2015-12-19 MED ORDER — VILAZODONE HCL 20 MG PO TABS: 20.0000 mg | ORAL_TABLET | Freq: Every day | ORAL | Status: DC

## 2015-12-19 MED ORDER — QUETIAPINE FUMARATE 100 MG PO TABS
100.0000 mg | ORAL_TABLET | Freq: Every day | ORAL | Status: DC
Start: 2015-12-19 — End: 2016-03-12

## 2015-12-19 MED ORDER — CLONAZEPAM 1 MG PO TABS: 1.0000 mg | ORAL_TABLET | Freq: Three times a day (TID) | ORAL | Status: DC

## 2015-12-19 NOTE — Progress Notes (Signed)
[name], female   DOB: 05-Jul-1945, 70 y.o.   MRN: [id number] [name], female   DOB: [birth year], 70 y.o.   MRN: [id number] [name], female   DOB: [birth year], 70 y.o.   MRN: [id number] [name], female   DOB: 1946-03-15, 70 y.o.   MRN: [id number] [name], female   DOB: [birth year], 70 y.o.   MRN: [id number] [name], female   DOB: February 14, 1946, 70 y.o.   MRN: [id number] [name], female   DOB: 15-Dec-1945, 70 y.o.   MRN: [id number] [name], female   DOB: 08/13/1945, 70 y.o.   MRN: [id number] [name], female   DOB: 10-Mar-1946, 70 y.o.   MRN: [id number] [name], female   DOB: 06-06-1946, 70 y.o.   MRN: [id number] [name], female   DOB: May 17, 1946, 70 y.o.   MRN: [id number] [name], female   DOB: 24-Aug-1945, 70 y.o.   MRN: [id number] [name], female   DOB: April 09, 1946, 70 y.o.   MRN: [id number] [name], female   DOB: 08-27-1945, 70 y.o.   MRN: [id number] [name], female   DOB: 1945-11-26, 70 y.o.   MRN: [id number] [name], female   DOB: 04-18-1946, 70 y.o.   MRN: [id number] [facility] [id number] Progress Note [name] MRN: [id number] DOB: [birth year] Age: 70 y.o.  Date: 12/19/2015 Start Time: 9:45 AM End Time: 10:10 AM  Chief Complaint: Chief Complaint  Patient presents with  . Depression  . Anxiety  . Follow-up  . Memory Loss    Subjective: "I' just got out of the hospital  This patient is a 70 year old black female who lives with her 43 year old daughter who has mild mental retardation. She lives in [place]. She is a retired [employer]. The patient states that she's been depressed "all my life.". She can't give any specific reasons for the depression but notes that at age 70 she was already taking  medication to help with sleep. In her 70s her family doctor diagnosed with schizophrenia although she had no symptoms of paranoia or delusions or hallucinations. She was given medication for this.  Since 1997 she's been coming here with symptoms of depression. She's also had significant problems with fibromyalgia and fatigue. In Neurontin and Ambien have helped. Lately however she's become more depressed and worried. She has had gallbladder surgery and she's concerned about how is going to go. For some reason she's been thinking a lot about death. She doesn't have anyone to stay with her daughter something goes wrong. She's been more negative but is  not suicidal but her energy has dropped. In general she is a very functional person  The patient returns after 4 months. She is accompanied by her sister. She was in [facility] hospital most of last month. Her sister lives in [place] and brought her there after she noticed the patient wasn't making sense see more paranoid and confused and couldn't take care of her basic needs. She stayed in the medical part of the hospital for about 3 weeks and then was transferred to psychiatry. She had a very thorough neurological workup including brain CT MRI and lumbar puncture all of which were noncontributory. It was thought she had a UTI which was treated. She was taken  off gabapentin and Ambien. She was tapered off Ativan and went home on no benzodiazepines except enough for 3 days. All she was in the hospital she was treated with Haldol but this was tapered off as well and she is only been discharged on Viibryd and trazodone to which she is allergic.  The patient is seen after 2 months. She is doing okay but still has episodes of memory loss. Her neurologist recently did an EEG but the result is not back. Her previous EEG and brain MRI are normal. She did have parathyroid surgery in April at [facility] and it may take a long time to readjust. Her mood is generally pretty good. She has  been trying to get out more and do things with her daughter but doesn't feel comfortable doing things alone. She denies being paranoid. Vitals: BP 143/107 mmHg  Pulse 63  Ht 5\' 2"  (1.575 m)  Wt 218 lb (98.884 kg)  BMI 39.86 kg/m2  SpO2 96%  Psychiatric history Patient has a long history of depression.  She has been seeing in this office since May 1997.  Patient denies any history of suicidal attempt however endorse chronic depression and anxiety.  In the past she has taken Prozac but did not like after taking few doses, she also has taken Seroquel, Abilify, Xanax, Pamelor, and amitriptyline.  Patient denies a history of paranoia or delusions however there are times when she has been very depressed and have a lot of negative symptoms.  Alcohol and substance use history Patient denies any history of alcohol or substance use.  Allergies: Allergies  Allergen Reactions  . Elavil [Amitriptyline] Other (See Comments)    Felt really nervous and felt like something was hold her feet or arm and/ or AM headache on it and back on it and went away off it.   . Abilify [Aripiprazole] Other (See Comments)    Dystonic reaction  . Trazodone And Nefazodone Other (See Comments)    Brought back bad dreams of things in the past  . Codeine Hives, Nausea Only and Other (See Comments)    Feels funny  . Latex Hives  . Penicillin V Itching  . Penicillins Hives    Has patient had a PCN reaction causing immediate rash, facial/tongue/throat swelling, SOB or lightheadedness with hypotension: Yes Has patient had a PCN reaction causing severe rash involving mucus membranes or skin necrosis: No Has patient had a PCN reaction that required hospitalization No Has patient had a PCN reaction occurring within the last 10 years: No If all of the above answers are "NO", then may proceed with Cephalosporin use.   . Polyethylene Glycol Other (See Comments)    Per allergy test  . Sulfonamide Derivatives Nausea And Vomiting   . Cymbalta [Duloxetine Hcl] Rash  . Remeron [Mirtazapine] Other (See Comments)    Caused lots of strange, weird, crazy dreams.   Medical History: Past Medical History  Diagnosis Date  . Hypertension   . Hyperlipidemia   . Gastroesophageal reflux disease   . Diverticulosis   . Anxiety and depression   . Adenomatous colon polyp 2006    excised in 2006 & 2010Due surveillance 06/2013  . Chronic back pain   . Thyroid nodule   . Fibromyalgia   . Migraines   . Insomnia   . Chronic constipation   . Asthma   . History of benign esophageal tumor 2010    granular cell esophageal tumor (Dx 06/2008), resected via EMR 2011, due repeat EGD 02/2012  .  Chest discomfort   . Hiatal hernia   . Depression   . Anxiety   . Sleep apnea     Stop Bang score of 5. Pt said she was told by Dr. Merlene Laughter that she had "a little bit" of sleep apena, but not bad enough to treat.  . Breast tumor   . Complication of anesthesia   . PONV (postoperative nausea and vomiting)   . Tumor of esophagus   . Psychosis   Patient has history of hypertension, hyperlipidemia, GERD, diverticulosis, colonic polyp, chronic back pain, fibromyalgia, chronic constipation, benign tumor of esophagus and chronic headache.  Her primary care physician is Dr. Legrand Rams, she sees Dr. Paulita Cradle for chronic pain, fibromyalgia and headache. Her cardiologist is Dr Elvera Maria. Surgical History: Past Surgical History  Procedure Laterality Date  . Tonsillectomy    . Abdominal hysterectomy    . Total knee arthroplasty  2002    Right; previous arthroscopic surgery  . Shoulder arthroscopy      Right; bone spurs removed  . Lung biopsy      negative  . 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 Right oophorectomy      benign disease  . Colonoscopy  06/2008, 06/2011    sigmoid tics, tubular adenoma; 2013: anal canal hemorrhoids  . Eus  08/2010    Dr [facility] with EGD. Retained food. No recurrent esophageal  lesion, bx negative.  . Colonoscopy  07/10/2011    Anal canal hemorrhoids likely the cause of hematochezia in the setting of constipation; otherwise normal rectum ;submucosal  petechiae in left colon of doubtful clinical significance; otherwise, normal colon  . Esophagogastroduodenoscopy  12/11/2011    [id number] Schatzki's ring; otherwise normal/Small hiatal hernia. Antral and bulbar erosions  . Bravo ph study  12/11/2011    Procedure: BRAVO [place] STUDY;  Surgeon: Daneil Dolin, MD;  Location: [facility] END[facility] SUITE;  Service: Endoscopy;  Laterality: N/A;  . Cholecystectomy N/A 04/09/2013    Procedure: LAPAROSCOPIC CHOLECYSTECTOMY;  Surgeon: Jamesetta So, MD;  Location: [facility] ORS;  Service: General;  Laterality: N/A;  . Esophagogastroduodenoscopy (egd) with propofol N/A 05/08/2015    Dr. Gala Romney: mild erosive reflux esophagitis, non-critical Schatzki's ring s/p dilation. Hiatal hernia.   . Esophageal dilation N/A 05/08/2015    Procedure: ESOPHAGEAL DILATION;  Surgeon: Daneil Dolin, MD;  Location: [facility] ORS;  Service: Endoscopy;  Laterality: N/AVenia Minks 54/56  . Parathyroidectomy  April 2017    [facility].    Family History: family history includes Alcohol abuse in her father; Anxiety disorder in her mother; Colon cancer in her paternal aunt and paternal uncle; Dementia in her maternal uncle; Depression in her mother; Heart attack in her mother; Stroke in her father. There is no history of ADD / ADHD, Bipolar disorder, Drug abuse, OCD, Paranoid behavior, Schizophrenia, Seizures, Sexual abuse, or Physical abuse. Reviewed and nothing new today again.  Mental status examination Patient is casually dressed and fairly groomed. She is calm cooperative and maintained fair eye contact. She described her mood as is improved but she is still little anxious She still has chronic pain  Her speech is coherent but slow.  Her thought processes clear and logical. She denies suicidal ideation and no active or passive homicidal  thoughts e. She denies any auditory or visual hallucination . She denies the paranoid delusions that she had in the past. There no psychotic symptoms present at this time. She's alert and oriented x3.  Her insight judgment impulse control is okay her fund of knowledge is fair and her language skills are good. He states that her short-term memory is quite poor  Lab Results:  Results for orders placed or performed in visit on 11/07/15 (from the past 8736 hour(s))  Urine culture   Collection Time: 11/07/15  4:14 PM  Result Value Ref Range   Colony Count NO GROWTH    Organism ID, Bacteria NO GROWTH   Urinalysis w microscopic + reflex cultur   Collection Time: 11/07/15  4:14 PM  Result Value Ref Range   Color, Urine YELLOW YELLOW   APPearance CLEAR CLEAR   Specific Gravity, Urine 1.005 1.001 - 1.035   pH 6.0 5.0 - 8.0   Glucose, UA NEGATIVE NEGATIVE   Bilirubin Urine NEGATIVE NEGATIVE   Ketones, ur NEGATIVE NEGATIVE   Hgb urine dipstick NEGATIVE NEGATIVE   Protein, ur NEGATIVE NEGATIVE   Nitrite NEGATIVE NEGATIVE   Leukocytes, UA 3+ (A) NEGATIVE   WBC, UA 20-40 (A) <=5 WBC/HPF   RBC / HPF 0-2 <=2 RBC/HPF   Squamous Epithelial / LPF 0-5 <=5 HPF   Bacteria, UA NONE SEEN NONE SEEN HPF   Crystals NONE SEEN NONE SEEN HPF   Casts NONE SEEN NONE SEEN LPF   Yeast NONE SEEN NONE SEEN HPF  Results for orders placed or performed in visit on 09/28/15 (from the past 8736 hour(s))  Basic Metabolic Panel (BMET)   Collection Time: 10/05/15  4:13 PM  Result Value Ref Range   Sodium 141 135 - 146 mmol/L   Potassium 3.9 3.5 - 5.3 mmol/L   Chloride 109 98 - 110 mmol/L   CO2 24 20 - 31 mmol/L   Glucose, Bld 96 65 - 99 mg/dL   BUN 7 7 - 25 mg/dL   Creat 0.90 0.50 - 0.99 mg/dL   Calcium 10.7 (H) 8.6 - 10.4 mg/dL  Results for orders placed or performed in visit on 09/25/15 (from the past 8736 hour(s))  Basic Metabolic Panel (BMET)   Collection Time: 09/25/15  3:27 PM  Result Value Ref Range   Sodium  141 135 - 146 mmol/L   Potassium 3.1 (L) 3.5 - 5.3 mmol/L   Chloride 104 98 - 110 mmol/L   CO2 24 20 - 31 mmol/L   Glucose, Bld 114 (H) 65 - 99 mg/dL   BUN 9 7 - 25 mg/dL   Creat 0.93 0.50 - 0.99 mg/dL   Calcium 10.5 (H) 8.6 - 10.4 mg/dL  Magnesium   Collection Time: 09/25/15  3:27 PM  Result Value Ref Range   Magnesium 1.8 1.5 - 2.5 mg/dL  Results for orders placed or performed in visit on 08/31/15 (from the past 8736 hour(s))  IFOBT POC (occult bld, rslt in office)   Collection Time: 08/31/15  4:14 PM  Result Value Ref Range   IFOBT Negative   Results for orders placed or performed in visit on 07/26/15 (from the past 8736 hour(s))  COMPLETE METABOLIC PANEL WITH GFR   Collection Time: 07/26/15  9:34 AM  Result Value Ref Range   Sodium 140 135 - 146 mmol/L   Potassium 4.0 3.5 - 5.3 mmol/L   Chloride 108 98 - 110 mmol/L   CO2 23 20 - 31 mmol/L   Glucose, Bld 96 65 - 99 mg/dL   BUN 8 7 - 25 mg/dL   Creat 1.03 (H) 0.50 - 0.99 mg/dL   Total Bilirubin 0.5 0.2 - 1.2 mg/dL   Alkaline Phosphatase  48 33 - 130 U/L   AST 15 10 - 35 U/L   ALT 12 6 - 29 U/L   Total Protein 6.2 6.1 - 8.1 g/dL   Albumin 3.9 3.6 - 5.1 g/dL   Calcium 10.7 (H) 8.6 - 10.4 mg/dL   GFR, Est African American 64 >=60 mL/min   GFR, Est Non African American 56 (L) >=60 mL/min  Results for orders placed or performed during the hospital encounter of 05/01/15 (from the past 8736 hour(s))  CBC with Differential/Platelet   Collection Time: 05/01/15  2:10 PM  Result Value Ref Range   WBC 4.6 4.0 - 10.5 K/uL   RBC 4.71 3.87 - 5.11 MIL/uL   Hemoglobin 12.5 12.0 - 15.0 g/dL   HCT 39.5 36.0 - 46.0 %   MCV 83.9 78.0 - 100.0 fL   MCH 26.5 26.0 - 34.0 pg   MCHC 31.6 30.0 - 36.0 g/dL   RDW 15.0 11.5 - 15.5 %   Platelets 188 150 - 400 K/uL   Neutrophils Relative % 53 %   Neutro Abs 2.4 1.7 - 7.7 K/uL   Lymphocytes Relative 39 %   Lymphs Abs 1.8 0.7 - 4.0 K/uL   Monocytes Relative 6 %   Monocytes Absolute 0.3 0.1 - 1.0  K/uL   Eosinophils Relative 2 %   Eosinophils Absolute 0.1 0.0 - 0.7 K/uL   Basophils Relative 0 %   Basophils Absolute 0.0 0.0 - 0.1 K/uL  Basic metabolic panel   Collection Time: 05/01/15  2:10 PM  Result Value Ref Range   Sodium 141 135 - 145 mmol/L   Potassium 3.9 3.5 - 5.1 mmol/L   Chloride 107 101 - 111 mmol/L   CO2 26 22 - 32 mmol/L   Glucose, Bld 95 65 - 99 mg/dL   BUN 10 6 - 20 mg/dL   Creatinine, Ser 1.13 (H) 0.44 - 1.00 mg/dL   Calcium 10.2 8.9 - 10.3 mg/dL   GFR calc non Af Amer 48 (L) >60 mL/min   GFR calc Af Amer 56 (L) >60 mL/min   Anion gap 8 5 - 15  Results for orders placed or performed in visit on 04/25/15 (from the past 8736 hour(s))  Clostridium Difficile by PCR   Collection Time: 05/03/15 11:17 AM  Result Value Ref Range   Toxigenic C Difficile by pcr Not Detected Not Detected   Review of systems is positive for bilateral knee pain  Assessment Axis I Maj. depressive disorder Axis II deferred Axis III see medical history Axis IV are to moderate.  Plan: I took her vitals.  I reviewed CC, tobacco/med/surg Hx, meds effects/ side effects, problem list, therapies and responses as well as current situation/symptoms discussed options. She will continue Viibryd 20 mg daily. She will continue clonazepam 1 mg 3 times a day to help with the anxiety and Seroquel 100 mg at bedtime to help with anxiety sleep and confusion/paranoia. She will return in 2 months. I woss  See orders and pt instructions for more details.  MEDICATIONS this encounter: Meds ordered this encounter  Medications  . hydrALAZINE (APRESOLINE) 50 MG tablet    Sig: Take 50 mg by mouth 2 (two) times daily.  Marland Kitchen atenolol (TENORMIN) 50 MG tablet    Sig: Take 50 mg by mouth 2 (two) times daily.  Marland Kitchen CALCIUM-VITAMIN D PO    Sig: Take by mouth daily.  Marland Kitchen amLODipine (NORVASC) 5 MG tablet    Sig:   . Vilazodone HCl (VIIBRYD) 20  MG TABS    Sig: Take 1 tablet (20 mg total) by mouth daily.    Dispense:  30  tablet    Refill:  2  . QUEtiapine (SEROQUEL) 100 MG tablet    Sig: Take 1 tablet (100 mg total) by mouth at bedtime.    Dispense:  30 tablet    Refill:  2  . clonazePAM (KLONOPIN) 1 MG tablet    Sig: Take 1 tablet (1 mg total) by mouth 3 (three) times daily.    Dispense:  90 tablet    Refill:  2    Medical Decision Making Problem Points:  Established problem, stable/improving (1), New problem, with additional work-up planned (4), Review of last therapy session (1) and Review of psycho-social stressors (1) Data Points:  Review or order clinical lab tests (1) Review of new medications or change in dosage (2)  I certify that outpatient services furnished can reasonably be expected to improve the patient's condition.   Levonne Spiller, MD

## 2015-12-21 ENCOUNTER — Ambulatory Visit (HOSPITAL_COMMUNITY): Payer: Self-pay | Admitting: Psychiatry

## 2015-12-21 DIAGNOSIS — R569 Unspecified convulsions: Secondary | ICD-10-CM | POA: Diagnosis not present

## 2015-12-22 ENCOUNTER — Ambulatory Visit (HOSPITAL_COMMUNITY): Payer: Self-pay | Admitting: Psychiatry

## 2015-12-27 ENCOUNTER — Encounter (HOSPITAL_COMMUNITY): Payer: Self-pay | Admitting: Psychiatry

## 2015-12-27 ENCOUNTER — Ambulatory Visit (INDEPENDENT_AMBULATORY_CARE_PROVIDER_SITE_OTHER): Payer: Medicare Other | Admitting: Psychiatry

## 2015-12-27 DIAGNOSIS — F330 Major depressive disorder, recurrent, mild: Secondary | ICD-10-CM | POA: Diagnosis not present

## 2015-12-27 NOTE — Patient Instructions (Signed)
Discussed orally 

## 2015-12-27 NOTE — Progress Notes (Signed)
     THERAPIST PROGRESS NOTE  Session Time:   Wednesday 12/27/2015 9:55 AM -  10:48 AM  Participation Level: Active  Behavioral Response: CasualAlertAnxious/ less depressed/anxious/tearful   Type of Therapy: Individual Therapy  Treatment Goals :    1. Process grief and loss issues related to changed functioning and memory difficulty.      2. Use mindfulness and acceptance strategies to reduce experimental avoidance and increase value-based behavior.  Treatment Goals addressed:         1,2  Interventions: CBT and Supportive  Summary: [name] is a 70 y.o. female who is a returning patient to this clinician and is resuming services per her psychiatrist Dr. Harrington Challenger' recommendation. Patient has a long-standing history of recurrent periods of depression. She last was seen by this clinician in April 2015. Patient was hospitalized at [facility] from 04/25/2015 through 06/20/2015 due to losing her memory per her report. She says doctors have identified problems with her thyroid as the cause. She is scheduled to have thyroid removed on 10/17/2015. Patient is experiencing depressed mood and significant anxiety. She blames self for losing memory and fears she will lose her memory again. She avoids been around people as she is embarrassed she still cannot remember like she once did and does not function as she has in the past. She is anxious about having surgery and she fears she may die.  Patient reports increased depressed mood due to decreased activity as a result of increased pain in her legs, hips, and ankle. She also reports increased fatigue. She continues to experience anxiety and expresses frustration as well as anger her sister recently went out of town for 2 weeks without informing patient. She depends on sister as an emergency contact as well as the person to help her manage her finances. She still has not talked with her sister regarding her concerns but does report she has talked with  sister in the past and sister became angry. Patient admits she approached sister in an aggressive manner.   Suicidal/Homicidal: No  Therapist Response: Reviewed symptoms, facilitated expression of feelings and assisted patient identify feelings beneath anger, discussed being assertive versus being aggressive, assisted patient identify ways to improve assertive communication with sister using "I"messages  Plan: Return in 4 weeks.   Diagnosis: Axis I: MDD    Axis II: Deferred    Eulice Rutledge, LCSW 12/27/2015

## 2015-12-28 ENCOUNTER — Ambulatory Visit (INDEPENDENT_AMBULATORY_CARE_PROVIDER_SITE_OTHER): Payer: Medicare Other | Admitting: Adult Health

## 2015-12-28 ENCOUNTER — Encounter: Payer: Self-pay | Admitting: Adult Health

## 2015-12-28 VITALS — BP 126/64 | HR 78 | Ht 64.0 in | Wt 214.0 lb

## 2015-12-28 DIAGNOSIS — Z79899 Other long term (current) drug therapy: Secondary | ICD-10-CM | POA: Diagnosis not present

## 2015-12-28 DIAGNOSIS — I10 Essential (primary) hypertension: Secondary | ICD-10-CM

## 2015-12-28 NOTE — Progress Notes (Signed)
Name: [name]    DOB: 02-08-1946  Age: 70 y.o.  MR#: [id number]       PCP:  Rosita Fire, MD      Insurance: Payor: [employer] MEDICARE / Plan: [facility] MEDICARE / Product Type: *No Product type* /   CC:   No chief complaint on file.   VS Filed Vitals:   12/28/15 1259  BP: 126/64  Pulse: 78  Height: 5\' 4"  (1.626 m)  Weight: 214 lb (97.07 kg)  SpO2: 98%    Weights Current Weight  12/28/15 214 lb (97.07 kg)  12/19/15 218 lb (98.884 kg)  11/09/15 206 lb 3.2 oz (93.532 kg)    Blood Pressure  BP Readings from Last 3 Encounters:  12/28/15 126/64  12/19/15 143/107  11/09/15 190/103     Admit date:  (Not on file) Last encounter with RMR:  09/29/2015   Allergy Elavil; Abilify; Trazodone and nefazodone; Codeine; Latex; Penicillin v; Penicillins; Polyethylene glycol; Sulfonamide derivatives; Cymbalta; and Remeron  Current Outpatient Prescriptions  Medication Sig Dispense Refill  . acetaminophen (TYLENOL) 500 MG tablet Take 500 mg by mouth every 8 (eight) hours as needed for mild pain or moderate pain.    Marland Kitchen albuterol (PROVENTIL HFA;VENTOLIN HFA) 108 (90 BASE) MCG/ACT inhaler Inhale 2 puffs into the lungs every 6 (six) hours as needed. For shortness of breath    . amLODipine (NORVASC) 5 MG tablet     . atenolol (TENORMIN) 50 MG tablet Take 50 mg by mouth 2 (two) times daily.    Marland Kitchen CALCIUM-VITAMIN D PO Take by mouth daily.    . cholecalciferol (VITAMIN D) 1000 units tablet Take 1,000 Units by mouth daily. Reported on 10/24/2015    . clonazePAM (KLONOPIN) 1 MG tablet Take 1 tablet (1 mg total) by mouth 3 (three) times daily. 90 tablet 2  . fluticasone (FLONASE) 50 MCG/ACT nasal spray Place 1 spray into both nostrils 2 (two) times daily.    . hydrALAZINE (APRESOLINE) 50 MG tablet Take 50 mg by mouth 2 (two) times daily.    Marland Kitchen HYDROcodone-acetaminophen (NORCO/VICODIN) 5-325 MG tablet Take 1 tablet by mouth every 6 (six) hours as needed. 120 tablet 0  . linaclotide (LINZESS) 72 MCG  capsule Take 1 capsule (72 mcg total) by mouth daily before breakfast. 90 capsule 5  . loratadine (CLARITIN) 10 MG tablet Take 10 mg by mouth daily. Reported on 12/19/2015    . losartan-hydrochlorothiazide (HYZAAR) 100-25 MG tablet Take 1 tablet by mouth daily.     . QUEtiapine (SEROQUEL) 100 MG tablet Take 1 tablet (100 mg total) by mouth at bedtime. 30 tablet 2  . spironolactone (ALDACTONE) 25 MG tablet Take 25 mg by mouth daily.    . Vilazodone HCl (VIIBRYD) 20 MG TABS Take 1 tablet (20 mg total) by mouth daily. 30 tablet 2   No current facility-administered medications for this visit.    Discontinued Meds:    Medications Discontinued During This Encounter  Medication Reason  . hydrochlorothiazide (HYDRODIURIL) 25 MG tablet Error  . losartan (COZAAR) 25 MG tablet Error  . furosemide (LASIX) 40 MG tablet Error  . Magnesium 400 MG TABS Error  . potassium chloride SA (K-DUR,KLOR-CON) 20 MEQ tablet Error  . nabumetone (RELAFEN) 500 MG tablet Error    Patient Active Problem List   Diagnosis Date Noted  . Urinary frequency 11/07/2015  . Abdominal pain 07/26/2015  . Dysphagia   . Schatzki's ring   . Hiatal hernia   . Knee contusion 05/02/2014  .  Acquired trigger finger 10/12/2013  . Constipation 07/15/2013  . Abdominal pain, epigastric 07/15/2013  . LLQ pain 07/15/2013  . Lightheaded 01/21/2013  . Elevated LFTs 12/26/2012  . History of benign esophageal tumor   . Adenomatous colon polyp   . [place] DISEASE, LUMBOSACRAL SPINE 05/01/2010  . HYPERLIPIDEMIA 08/22/2009  . THYROID NODULE 08/16/2009  . Anxiety and depression 08/16/2009  . HYPERTENSION 08/16/2009  . Gastroesophageal reflux disease 08/16/2009  . FIBROMYALGIA 08/16/2009    LABS    Component Value Date/Time   NA 141 10/05/2015 1613   NA 141 09/25/2015 1527   NA 140 07/26/2015 0934   K 3.9 10/05/2015 1613   K 3.1* 09/25/2015 1527   K 4.0 07/26/2015 0934   CL 109 10/05/2015 1613   CL 104 09/25/2015  1527   CL 108 07/26/2015 0934   CO2 24 10/05/2015 1613   CO2 24 09/25/2015 1527   CO2 23 07/26/2015 0934   GLUCOSE 96 10/05/2015 1613   GLUCOSE 114* 09/25/2015 1527   GLUCOSE 96 07/26/2015 0934   BUN 7 10/05/2015 1613   BUN 9 09/25/2015 1527   BUN 8 07/26/2015 0934   CREATININE 0.90 10/05/2015 1613   CREATININE 0.93 09/25/2015 1527   CREATININE 1.03* 07/26/2015 0934   CREATININE 1.13* 05/01/2015 1410   CREATININE 0.95 04/05/2013 1315   CREATININE 0.79 12/28/2012 0514   CALCIUM 10.7* 10/05/2015 1613   CALCIUM 10.5* 09/25/2015 1527   CALCIUM 10.7* 07/26/2015 0934   GFRNONAA 56* 07/26/2015 0934   GFRNONAA 48* 05/01/2015 1410   GFRNONAA 61* 04/05/2013 1315   GFRNONAA 85* 12/28/2012 0514   GFRAA 64 07/26/2015 0934   GFRAA 56* 05/01/2015 1410   GFRAA 70* 04/05/2013 1315   GFRAA >90 12/28/2012 0514   CMP     Component Value Date/Time   NA 141 10/05/2015 1613   K 3.9 10/05/2015 1613   CL 109 10/05/2015 1613   CO2 24 10/05/2015 1613   GLUCOSE 96 10/05/2015 1613   BUN 7 10/05/2015 1613   CREATININE 0.90 10/05/2015 1613   CREATININE 1.13* 05/01/2015 1410   CALCIUM 10.7* 10/05/2015 1613   PROT 6.2 07/26/2015 0934   ALBUMIN 3.9 07/26/2015 0934   AST 15 07/26/2015 0934   ALT 12 07/26/2015 0934   ALKPHOS 48 07/26/2015 0934   BILITOT 0.5 07/26/2015 0934   GFRNONAA 56* 07/26/2015 0934   GFRNONAA 48* 05/01/2015 1410   GFRAA 64 07/26/2015 0934   GFRAA 56* 05/01/2015 1410       Component Value Date/Time   WBC 4.6 05/01/2015 1410   WBC 5.8 09/27/2014 1129   WBC 4.8 04/05/2013 1315   HGB 12.5 05/01/2015 1410   HGB 12.7 09/27/2014 1129   HGB 13.2 04/05/2013 1315   HCT 39.5 05/01/2015 1410   HCT 40.0 09/27/2014 1129   HCT 40.9 04/05/2013 1315   MCV 83.9 05/01/2015 1410   MCV 81.3 09/27/2014 1129   MCV 83.1 04/05/2013 1315    Lipid Panel     Component Value Date/Time   CHOL 173 09/27/2014 1129   TRIG 88 09/27/2014 1129   HDL 61 09/27/2014 1129   CHOLHDL 2.8 09/27/2014  1129   VLDL 18 09/27/2014 1129   LDLCALC 94 09/27/2014 1129    ABG No results found for: PHART, PCO2ART, PO2ART, HCO3, TCO2, ACIDBASEDEF, O2SAT   Lab Results  Component Value Date   TSH 1.145 12/10/2010   BNP (last 3 results) No results for input(s): BNP in the last 8760 hours.  ProBNP (  last 3 results) No results for input(s): PROBNP in the last 8760 hours.  Cardiac Panel (last 3 results) No results for input(s): CKTOTAL, CKMB, TROPONINI, RELINDX in the last 72 hours.  Iron/TIBC/Ferritin/ %Sat No results found for: IRON, TIBC, FERRITIN, IRONPCTSAT   EKG Orders placed or performed during the hospital encounter of 07/26/15  . EKG 12-Lead  . EKG 12-Lead  . EKG     Prior Assessment and Plan Problem List as of 12/28/2015      Cardiovascular and Mediastinum   HYPERTENSION   Last Assessment & Plan 01/21/2013 Office Visit Written 01/21/2013 12:37 PM by Yehuda Savannah, MD    Blood pressure control was somewhat suboptimal in hospital and by patient's determinations at home. Her psychiatrist, Dr. Gilford Rile, recommends prazosin, as he feels it may provide benefit for her restless leg syndrome.  I was not aware of that effect of this drug, but have no objection to trying it as an additional antihypertensive agent.        Respiratory   Hiatal hernia     Digestive   Gastroesophageal reflux disease   Last Assessment & Plan 07/15/2013 Office Visit Written 07/15/2013  2:33 PM by Mahala Menghini, PA-C    Continues to be ongoing issues for her. She has had multiple PPI failure as previously noted. H/O granular cell tumor of the esophagus in 2010. EUS by Dr. Newman Pies in 2012 showed no recurrent tumor. EGD in 2013 showed no recurrent tumor. She saw Dr. Newman Pies in January 2013 for refractory heartburn, EGD and gastric emptying study offered the patient did not want to pursue any further workup at that time. Patient states she to stop the AcipHex and has no change in her symptoms. To discuss further with  Dr. Gala Romney. Since she was unable to have a pH/impeded study due to difficult nasal intubation, wonder if she would be a candidate for pH study through [facility] a pH probe placement instead.      Adenomatous colon polyp   Constipation   Last Assessment & Plan 11/07/2015 Office Visit Written 11/09/2015  9:11 AM by Orvil Feil, NP    Remains an issue, not ideally managed with Miralax. Amitiza not tolerated due to cramping, and Linzess was reportedly too expensive; however, I feel perhaps she needed a prior authorization. Will trial Linzess 72 mcg once daily and see if this can be approved through insurance. Occasional fecal incontinence, new onset. Discussed more aggressive management of constipation, with consideration of colonoscopy if no improvement. She is hesitant to pursue this currently. Due for surveillance colonoscopy next year. Will trial Linzess 72 and have close follow-up in future.       Dysphagia   Schatzki's ring     Endocrine   THYROID NODULE     Musculoskeletal and Integument   DEGENERATIVE DISC DISEASE, LUMBOSACRAL SPINE   FIBROMYALGIA   Acquired trigger finger     Other   HYPERLIPIDEMIA   Last Assessment & Plan 01/21/2012 Office Visit Written 01/21/2012 12:16 PM by Yehuda Savannah, MD    Excellent control of hyperlipidemia, especially considering the absence of known vascular disease.  Current treatment with moderate dose lovastatin is quite adequate.      Anxiety and depression   History of benign esophageal tumor   Last Assessment & Plan 11/21/2011 Office Visit Written 11/21/2011  8:55 AM by Andria Meuse, NP    Scheduled for EGD as above      Elevated LFTs   Lightheaded  Last Assessment & Plan 01/21/2013 Office Visit Edited 01/21/2013 12:36 PM by Yehuda Savannah, MD    Patient complains of fairly frequent episodes of lightheadedness. She denies vertigo. Blood pressure medications have been adjusted in the past without benefit. I doubt that there is a cardiac cause for  these symptoms, but we'll undertake event recording to verify that arrhythmia is not the cause.  Lightheadedness is of significant concern, since it was associated with her fall and right radial fracture.      Abdominal pain, epigastric   Last Assessment & Plan 07/15/2013 Office Visit Edited 07/15/2013  2:35 PM by Mahala Menghini, PA-C    Ongoing, intermittent epigastric pain. States no better since her gb was removed. ?related to GERD or gastropathy. We will have her check LFTs 6-8 hours after next significant episode to make sure no biliary etiology. Ov with Dr. Gala Romney in six weeks.      LLQ pain   Last Assessment & Plan 07/15/2013 Office Visit Written 07/15/2013  2:30 PM by Mahala Menghini, PA-C    Likely secondary to constipation. She is up-to-date on her colonoscopy. Not due until 2018. Trial of Linzess 164mcg daily. Keep a stool diary and notations about abdominal pain. Ov in six weeks with Dr. Gala Romney.      Knee contusion   Abdominal pain   Last Assessment & Plan 11/07/2015 Office Visit Written 11/09/2015  9:07 AM by Orvil Feil, NP    Right-sided abdominal pain that appears musculoskeletal in nature. LFTs and CT normal. Gallbladder absent. Likely stool burden playing a role in setting of constipation.       Urinary frequency   Last Assessment & Plan 11/07/2015 Office Visit Written 11/09/2015  9:12 AM by Orvil Feil, NP    With foul odor. Check UA with culture reflex.           Imaging: No results found.

## 2015-12-28 NOTE — Patient Instructions (Signed)
Your physician wants you to follow-up in: 6 Months with Dr. Harl Bowie. You will receive a reminder letter in the mail two months in advance. If you don't receive a letter, please call our office to schedule the follow-up appointment.  Your physician recommends that you continue on your current medications as directed. Please refer to the Current Medication list given to you today.  Your physician recommends that you return for lab work in: [place])  If you need a refill on your cardiac medications before your next appointment, please call your pharmacy.  Thank you for choosing [facility]!

## 2015-12-28 NOTE — Progress Notes (Signed)
Cardiology Office Note   Date:  12/28/2015   ID:  [name], DOB [birth year], MRN [id number]  PCP:  Rosita Fire, MD  Cardiologist: Cloria Spring, NP   No chief complaint on file.     History of Present Illness: [name] is a 70 y.o. female who presents for assessment and management of chronic diastolic heart failure, with chronic bilateral edema, hypertension, with history of lightheadedness and dizziness.she was last seen by Dr. Harl Bowie on 08/22/2015.she was started on Lasix, 40 mg daily for chronic lower extremity edema, for hypertension. I have advised her on the days that she chooses to take Lasix, which she is utilizing when necessary for some edema, she is not to take the HCTZ to prevent dizziness  Reviewed labs. Na: 141, Potassium 3.9, Creatinine 0.90. Mg 1.8.  She assay with multiple somatic complaints of musculoskeletal issues to include lower back pain and bilateral leg pain. She states she feels weak and tired firmness. Blood pressure is much better controlled. She has been seen by her primary care physician who is changed her medications. She was started on amlodipine 5 mg daily restarted on atenolol 50 mg once a day, and started on hydralazine 50 mg twice a day. She raises her record her blood pressures and they have drop from 181/96 to iron 34/76. Today in the office is 126/64.  Past Medical History  Diagnosis Date  . Hypertension   . Hyperlipidemia   . Gastroesophageal reflux disease   . Diverticulosis   . Anxiety and depression   . Adenomatous colon polyp 2006    excised in 2006 & 2010Due surveillance 06/2013  . Chronic back pain   . Thyroid nodule   . Fibromyalgia   . Migraines   . Insomnia   . Chronic constipation   . Asthma   . History of benign esophageal tumor 2010    granular cell esophageal tumor (Dx 06/2008), resected via EMR 2011, due repeat EGD 02/2012  . Chest discomfort   . Hiatal hernia   . Depression   . Anxiety   . Sleep  apnea     Stop Bang score of 5. Pt said she was told by Dr. Merlene Laughter that she had "a little bit" of sleep apena, but not bad enough to treat.  . Breast tumor   . Complication of anesthesia   . PONV (postoperative nausea and vomiting)   . Tumor of esophagus   . Psychosis     Past Surgical History  Procedure Laterality Date  . Tonsillectomy    . Abdominal hysterectomy    . Total knee arthroplasty  2002    Right; previous arthroscopic surgery  . Shoulder arthroscopy      Right; bone spurs removed  . Lung biopsy      negative  . 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 Right oophorectomy      benign disease  . Colonoscopy  06/2008, 06/2011    sigmoid tics, tubular adenoma; 2013: anal canal hemorrhoids  . Eus  08/2010    Dr [facility] with EGD. Retained food. No recurrent esophageal lesion, bx negative.  . Colonoscopy  07/10/2011    Anal canal hemorrhoids likely the cause of hematochezia in the setting of constipation; otherwise normal rectum ;submucosal  petechiae in left colon of doubtful clinical significance; otherwise, normal colon  . Esophagogastroduodenoscopy  12/11/2011    [id number] Schatzki's ring; otherwise normal/Small hiatal hernia. Antral and bulbar erosions  .  [facility] ph study  12/11/2011    Procedure: BRAVO [place] STUDY;  Surgeon: Daneil Dolin, MD;  Location: [facility] ENDO SUITE;  Service: Endoscopy;  Laterality: N/A;  . Cholecystectomy N/A 04/09/2013    Procedure: LAPAROSCOPIC CHOLECYSTECTOMY;  Surgeon: Jamesetta So, MD;  Location: [facility] ORS;  Service: General;  Laterality: N/A;  . Esophagogastroduodenoscopy (egd) with propofol N/A 05/08/2015    Dr. Gala Romney: mild erosive reflux esophagitis, non-critical Schatzki's ring s/p dilation. Hiatal hernia.   . Esophageal dilation N/A 05/08/2015    Procedure: ESOPHAGEAL DILATION;  Surgeon: Daneil Dolin, MD;  Location: [facility] ORS;  Service: Endoscopy;  Laterality: N/AVenia Minks 54/56  .  Parathyroidectomy  April 2017    [facility].      Current Outpatient Prescriptions  Medication Sig Dispense Refill  . acetaminophen (TYLENOL) 500 MG tablet Take 500 mg by mouth every 8 (eight) hours as needed for mild pain or moderate pain.    Marland Kitchen albuterol (PROVENTIL HFA;VENTOLIN HFA) 108 (90 BASE) MCG/ACT inhaler Inhale 2 puffs into the lungs every 6 (six) hours as needed. For shortness of breath    . amLODipine (NORVASC) 5 MG tablet     . atenolol (TENORMIN) 50 MG tablet Take 50 mg by mouth 2 (two) times daily.    Marland Kitchen CALCIUM-VITAMIN D PO Take by mouth daily.    . cholecalciferol (VITAMIN D) 1000 units tablet Take 1,000 Units by mouth daily. Reported on 10/24/2015    . clonazePAM (KLONOPIN) 1 MG tablet Take 1 tablet (1 mg total) by mouth 3 (three) times daily. 90 tablet 2  . fluticasone (FLONASE) 50 MCG/ACT nasal spray Place 1 spray into both nostrils 2 (two) times daily.    . hydrALAZINE (APRESOLINE) 50 MG tablet Take 50 mg by mouth 2 (two) times daily.    Marland Kitchen HYDROcodone-acetaminophen (NORCO/VICODIN) 5-325 MG tablet Take 1 tablet by mouth every 6 (six) hours as needed. 120 tablet 0  . linaclotide (LINZESS) 72 MCG capsule Take 1 capsule (72 mcg total) by mouth daily before breakfast. 90 capsule 5  . loratadine (CLARITIN) 10 MG tablet Take 10 mg by mouth daily. Reported on 12/19/2015    . losartan-hydrochlorothiazide (HYZAAR) 100-25 MG tablet Take 1 tablet by mouth daily.     . QUEtiapine (SEROQUEL) 100 MG tablet Take 1 tablet (100 mg total) by mouth at bedtime. 30 tablet 2  . spironolactone (ALDACTONE) 25 MG tablet Take 25 mg by mouth daily.    . Vilazodone HCl (VIIBRYD) 20 MG TABS Take 1 tablet (20 mg total) by mouth daily. 30 tablet 2   No current facility-administered medications for this visit.    Allergies:   Elavil; Abilify; Trazodone and nefazodone; Codeine; Latex; Penicillin v; Penicillins; Polyethylene glycol; Sulfonamide derivatives; Cymbalta; and Remeron    Social History:  The patient   reports that she has never smoked. She has never used smokeless tobacco. She reports that she does not drink alcohol or use illicit drugs.   Family History:  The patient's family history includes Alcohol abuse in her father; Anxiety disorder in her mother; Colon cancer in her paternal aunt and paternal uncle; Dementia in her maternal uncle; Depression in her mother; Heart attack in her mother; Stroke in her father. There is no history of ADD / ADHD, Bipolar disorder, Drug abuse, OCD, Paranoid behavior, Schizophrenia, Seizures, Sexual abuse, or Physical abuse.    ROS: All other systems are reviewed and negative. Unless otherwise mentioned in H&P    PHYSICAL EXAM: VS:  BP 126/64  mmHg  Pulse 78  Ht 5\' 4"  (1.626 m)  Wt 214 lb (97.07 kg)  BMI 36.72 kg/m2  SpO2 98% , BMI Body mass index is 36.72 kg/(m^2). GEN: Well nourished, well developed, in no acute distress HEENT: normal Neck: no JVD, carotid bruits, or masses Cardiac: RRR; no murmurs, rubs, or gallops,no edema  Respiratory:  clear to auscultation bilaterally, normal work of breathing GI: soft, nontender, nondistended, + BS MS: no deformity or atrophy Skin: warm and dry, no rash Neuro:  Strength and sensation are intact Psych: euthymic mood, full affect   Recent Labs: 05/01/2015: Hemoglobin 12.5; Platelets 188 07/26/2015: ALT 12 09/25/2015: Magnesium 1.8 10/05/2015: BUN 7; Creat 0.90; Potassium 3.9; Sodium 141    Lipid Panel    Component Value Date/Time   CHOL 173 09/27/2014 1129   TRIG 88 09/27/2014 1129   HDL 61 09/27/2014 1129   CHOLHDL 2.8 09/27/2014 1129   VLDL 18 09/27/2014 1129   LDLCALC 94 09/27/2014 1129      Wt Readings from Last 3 Encounters:  12/28/15 214 lb (97.07 kg)  12/19/15 218 lb (98.884 kg)  11/09/15 206 lb 3.2 oz (93.532 kg)      ASSESSMENT AND PLAN:  1.  Hypertension: blood pressure medications have been changed by primary care physician. Followup labs need to be drawn. We will order a BMET and  send results to primary care. Will not make any changes in her regimen at this time as her blood pressure is well-controlled.  2. Chronic back pain:she speaks of this at length. She is being followed by Dr. Aline Brochure. I have advised her to return to him for any further recommendations. She states she wants the surgery but she is not in favor of it. Other options and discussion should be considered for she follows up with him.   Current medicines are reviewed at length with the patient today.    Labs/ tests ordered today include: BMET   Orders Placed This Encounter  Procedures  . Basic Metabolic Panel (BMET)     Disposition:   FU with 6 months Signed, Jory Sims, NP  12/28/2015 1:41 PM    [facility]. [address], [place], [place] [address] Phone: [phone]; Fax: [phone]

## 2015-12-29 ENCOUNTER — Telehealth: Payer: Self-pay | Admitting: *Deleted

## 2015-12-29 DIAGNOSIS — Z79899 Other long term (current) drug therapy: Secondary | ICD-10-CM

## 2015-12-29 MED ORDER — HYDRALAZINE HCL 50 MG PO TABS: 75.0000 mg | ORAL_TABLET | Freq: Three times a day (TID) | ORAL | Status: DC

## 2015-12-29 NOTE — Telephone Encounter (Signed)
-----   Message from Lendon Colonel, NP sent at 12/29/2015  4:22 PM EDT ----- Discontinue losartan/HCTZ and spironolactone due to significantly elevated creatinine leading to renal insufficiency. Increase Hydralazine to 75 mg TID. Repeat labs one week. Have her see Dr. Legrand Rams for follow up with labs to be sent to him. Will need repeat BP check in one week with these medication changes.

## 2016-01-01 ENCOUNTER — Telehealth: Payer: Self-pay

## 2016-01-01 LAB — BASIC METABOLIC PANEL
BUN: 17 mg/dL (ref 7–25)
CO2: 24 mmol/L (ref 20–31)
Calcium: 10.1 mg/dL (ref 8.6–10.4)
Chloride: 103 mmol/L (ref 98–110)
Creat: 1.37 mg/dL — ABNORMAL HIGH (ref 0.50–0.99)
Glucose, Bld: 82 mg/dL (ref 65–99)
Potassium: 4.1 mmol/L (ref 3.5–5.3)
Sodium: 139 mmol/L (ref 135–146)

## 2016-01-01 NOTE — Telephone Encounter (Signed)
Then have her stay on her current medications and not stop the ACE and the spironolactone.

## 2016-01-01 NOTE — Telephone Encounter (Signed)
ammended  creatinine is 1.37, NOT 3.66   Will forward to Arnold Long NP

## 2016-01-01 NOTE — Telephone Encounter (Signed)
[  01/01/2016 3:59 PM] Jory Sims:  Yes Back to original medication regimen before changes. The lab values were incorrectly reported.  [01/01/2016 4:00 PM] Barbarann Ehlers:  ok ty   Patient ntified

## 2016-01-01 NOTE — Addendum Note (Signed)
Addended by: Barbarann Ehlers A on: 01/01/2016 04:20 PM   Modules accepted: Orders, Medications

## 2016-01-02 DIAGNOSIS — E039 Hypothyroidism, unspecified: Secondary | ICD-10-CM | POA: Diagnosis not present

## 2016-01-02 DIAGNOSIS — M810 Age-related osteoporosis without current pathological fracture: Secondary | ICD-10-CM | POA: Diagnosis not present

## 2016-01-02 DIAGNOSIS — R109 Unspecified abdominal pain: Secondary | ICD-10-CM | POA: Diagnosis not present

## 2016-01-02 DIAGNOSIS — I10 Essential (primary) hypertension: Secondary | ICD-10-CM | POA: Diagnosis not present

## 2016-01-02 DIAGNOSIS — Z0000 Encounter for general adult medical examination without abnormal findings: Secondary | ICD-10-CM | POA: Diagnosis not present

## 2016-01-03 ENCOUNTER — Other Ambulatory Visit (HOSPITAL_COMMUNITY): Payer: Self-pay | Admitting: Internal Medicine

## 2016-01-03 DIAGNOSIS — N6320 Unspecified lump in the left breast, unspecified quadrant: Secondary | ICD-10-CM

## 2016-01-03 DIAGNOSIS — Z09 Encounter for follow-up examination after completed treatment for conditions other than malignant neoplasm: Secondary | ICD-10-CM

## 2016-01-05 ENCOUNTER — Ambulatory Visit (INDEPENDENT_AMBULATORY_CARE_PROVIDER_SITE_OTHER): Payer: Medicare Other | Admitting: *Deleted

## 2016-01-05 VITALS — BP 142/90 | HR 61

## 2016-01-05 DIAGNOSIS — I10 Essential (primary) hypertension: Secondary | ICD-10-CM | POA: Diagnosis not present

## 2016-01-05 NOTE — Progress Notes (Signed)
Patient states that she took Hydrazine 75mg  today and Hyzaar 100-25 mg. Patient states that she did not take Spironlactone today. Patient reports being dizzy today. Patient given AVS with current med list.

## 2016-01-05 NOTE — Progress Notes (Signed)
Ok. Will see on follow up.Will need to have BMET repeated.

## 2016-01-05 NOTE — Patient Instructions (Signed)
Your physician recommends that you schedule a follow-up appointment in: As scheduled   Your physician recommends that you continue on your current medications as directed. Please refer to the Current Medication list given to you today.  Losartan/HCTZ 100-25mg  Take One Tablet Daily Spironolactone 25 mg Take One Tablet Daily Hydralazine HCL 50  mg Take One Tablet Two Times Daily   If you need a refill on your cardiac medications before your next appointment, please call your pharmacy. (Call office for refill on Hydralazine)   Thank you for choosing [facility]!

## 2016-01-08 ENCOUNTER — Telehealth: Payer: Self-pay

## 2016-01-08 DIAGNOSIS — I10 Essential (primary) hypertension: Secondary | ICD-10-CM | POA: Diagnosis not present

## 2016-01-08 LAB — BASIC METABOLIC PANEL
BUN: 17 mg/dL (ref 7–25)
CO2: 23 mmol/L (ref 20–31)
Calcium: 9.8 mg/dL (ref 8.6–10.4)
Chloride: 106 mmol/L (ref 98–110)
Creat: 1.29 mg/dL — ABNORMAL HIGH (ref 0.50–0.99)
Glucose, Bld: 103 mg/dL — ABNORMAL HIGH (ref 65–99)
Potassium: 3.5 mmol/L (ref 3.5–5.3)
Sodium: 141 mmol/L (ref 135–146)

## 2016-01-08 NOTE — Telephone Encounter (Signed)
-----   Message from Lendon Colonel, NP sent at 01/05/2016  5:06 PM EDT -----   ----- Message -----    From: Levonne Hubert, LPN    Sent: [id number]   4:50 PM      To: Lendon Colonel, NP

## 2016-01-08 NOTE — Telephone Encounter (Signed)
Spoke with pt, will get BMET today or tomorrow

## 2016-01-09 DIAGNOSIS — J4531 Mild persistent asthma with (acute) exacerbation: Secondary | ICD-10-CM | POA: Diagnosis not present

## 2016-01-09 DIAGNOSIS — J309 Allergic rhinitis, unspecified: Secondary | ICD-10-CM | POA: Diagnosis not present

## 2016-01-16 ENCOUNTER — Ambulatory Visit (HOSPITAL_COMMUNITY)
Admission: RE | Admit: 2016-01-16 | Discharge: 2016-01-16 | Disposition: A | Discharge status: Home / Self Care | Payer: Medicare Other | Source: Ambulatory Visit | Attending: Internal Medicine | Admitting: Internal Medicine

## 2016-01-16 DIAGNOSIS — J449 Chronic obstructive pulmonary disease, unspecified: Secondary | ICD-10-CM | POA: Diagnosis not present

## 2016-01-16 DIAGNOSIS — N63 Unspecified lump in breast: Secondary | ICD-10-CM | POA: Insufficient documentation

## 2016-01-16 DIAGNOSIS — Z09 Encounter for follow-up examination after completed treatment for conditions other than malignant neoplasm: Secondary | ICD-10-CM

## 2016-01-16 DIAGNOSIS — R922 Inconclusive mammogram: Secondary | ICD-10-CM | POA: Diagnosis not present

## 2016-01-16 DIAGNOSIS — N6320 Unspecified lump in the left breast, unspecified quadrant: Secondary | ICD-10-CM

## 2016-01-17 ENCOUNTER — Ambulatory Visit (HOSPITAL_COMMUNITY): Payer: Self-pay | Admitting: Psychiatry

## 2016-01-17 DIAGNOSIS — J449 Chronic obstructive pulmonary disease, unspecified: Secondary | ICD-10-CM | POA: Diagnosis not present

## 2016-01-25 ENCOUNTER — Encounter (HOSPITAL_COMMUNITY): Payer: Self-pay | Admitting: Psychiatry

## 2016-01-25 ENCOUNTER — Ambulatory Visit (INDEPENDENT_AMBULATORY_CARE_PROVIDER_SITE_OTHER): Payer: Medicare Other | Admitting: Psychiatry

## 2016-01-25 DIAGNOSIS — F330 Major depressive disorder, recurrent, mild: Secondary | ICD-10-CM

## 2016-01-25 NOTE — Progress Notes (Signed)
     THERAPIST PROGRESS NOTE  Session Time:   Thursday 01/25/2016  10:40 AM -  11:35 AM           Wednesday 12/27/2015 9:55 AM -  10:48 AM  Participation Level: Active  Behavioral Response: CasualAlertAnxious/ less depressed/anxious/  Type of Therapy: Individual Therapy  Treatment Goals :    1. Process grief and loss issues related to changed functioning and memory difficulty.      2. Use mindfulness and acceptance strategies to reduce experimental avoidance and increase value-based behavior.  Treatment Goals addressed:         1,2  Interventions: CBT and Supportive  Summary: [name] is a 70 y.o. female who is a returning patient to this clinician and is resuming services per her psychiatrist Dr. Harrington Challenger' recommendation. Patient has a long-standing history of recurrent periods of depression. She last was seen by this clinician in April 2015. Patient was hospitalized at [facility] from 04/25/2015 through 06/20/2015 due to losing her memory per her report. She says doctors have identified problems with her thyroid as the cause. She is scheduled to have thyroid removed on 10/17/2015. Patient is experiencing depressed mood and significant anxiety. She blames self for losing memory and fears she will lose her memory again. She avoids been around people as she is embarrassed she still cannot remember like she once did and does not function as she has in the past. She is anxious about having surgery and she fears she may die.  Patient reports she has been sick with sinus issues since last session. She expresses frustration with medical provider as he has not provided the treatment patient thinks she needs. She is considering seeing another provider. She reports increased fatigue and decreased involvement in activity due to sinus issues. Patient states she only sleeps about 4 hours per night. She does continue to attend church and reports watching TV as her main activity. She continues to express  worry and frustration regarding memory difficulty and the effects it has had on her functioning. She expresses increased acceptance of her sister managing her finances and reports decreased stress regarding this. She also reports more realistic expectations of the relationship with her sister.    Suicidal/Homicidal: No  Therapist Response: Reviewed symptoms, facilitated expression of feelings, continued to work with patient to identify ways her life has changed, assisted patient to identify compensatory measures and tools in response to memory difficulty, discuss the effects of sleep deprivation and anxiety, assisted patient identify ways to improve sleep hygiene, reviewed relaxation technique  Plan: Return in 4 weeks.   Diagnosis: Axis I: MDD    Axis II: Deferred    Atlee Kluth, LCSW 01/25/2016

## 2016-01-31 ENCOUNTER — Other Ambulatory Visit: Payer: Self-pay

## 2016-02-01 ENCOUNTER — Telehealth (HOSPITAL_COMMUNITY): Payer: Self-pay | Admitting: *Deleted

## 2016-02-01 NOTE — Telephone Encounter (Signed)
phone call from [facility] with [facility]. Would like phone call from [facility] regarding patient's ongoing care.  Please call between 7 a.m. and 6 p.m. Central time.  Any member can assist with phone call.  Need to discuss current diagnosis, clinical progress, goals, and progress.

## 2016-02-05 DIAGNOSIS — G43719 Chronic migraine without aura, intractable, without status migrainosus: Secondary | ICD-10-CM | POA: Diagnosis not present

## 2016-02-05 DIAGNOSIS — G3184 Mild cognitive impairment, so stated: Secondary | ICD-10-CM | POA: Diagnosis not present

## 2016-02-07 ENCOUNTER — Telehealth (HOSPITAL_COMMUNITY): Payer: Self-pay | Admitting: *Deleted

## 2016-02-07 ENCOUNTER — Other Ambulatory Visit (HOSPITAL_COMMUNITY): Payer: Self-pay | Admitting: Internal Medicine

## 2016-02-07 ENCOUNTER — Ambulatory Visit (HOSPITAL_COMMUNITY)
Admission: RE | Admit: 2016-02-07 | Discharge: 2016-02-07 | Disposition: A | Discharge status: Home / Self Care | Payer: Medicare Other | Source: Ambulatory Visit | Attending: Internal Medicine | Admitting: Internal Medicine

## 2016-02-07 DIAGNOSIS — M25562 Pain in left knee: Secondary | ICD-10-CM | POA: Diagnosis not present

## 2016-02-07 DIAGNOSIS — M1712 Unilateral primary osteoarthritis, left knee: Secondary | ICD-10-CM | POA: Insufficient documentation

## 2016-02-07 DIAGNOSIS — M179 Osteoarthritis of knee, unspecified: Secondary | ICD-10-CM | POA: Diagnosis not present

## 2016-02-07 DIAGNOSIS — M11262 Other chondrocalcinosis, left knee: Secondary | ICD-10-CM | POA: Insufficient documentation

## 2016-02-07 NOTE — Telephone Encounter (Signed)
phone call from [facility], would like phone call within the next  five days.

## 2016-02-08 ENCOUNTER — Telehealth (HOSPITAL_COMMUNITY): Payer: Self-pay | Admitting: Psychiatry

## 2016-02-08 DIAGNOSIS — H25013 Cortical age-related cataract, bilateral: Secondary | ICD-10-CM | POA: Diagnosis not present

## 2016-02-08 DIAGNOSIS — H538 Other visual disturbances: Secondary | ICD-10-CM | POA: Diagnosis not present

## 2016-02-08 DIAGNOSIS — H04123 Dry eye syndrome of bilateral lacrimal glands: Secondary | ICD-10-CM | POA: Diagnosis not present

## 2016-02-08 NOTE — Telephone Encounter (Signed)
Therapist returned call to insurance advocate and provided information regarding diagnosis, goals, and progress.

## 2016-02-14 ENCOUNTER — Ambulatory Visit (HOSPITAL_COMMUNITY): Payer: Self-pay | Admitting: Psychiatry

## 2016-02-17 DIAGNOSIS — J449 Chronic obstructive pulmonary disease, unspecified: Secondary | ICD-10-CM | POA: Diagnosis not present

## 2016-02-28 DIAGNOSIS — R109 Unspecified abdominal pain: Secondary | ICD-10-CM | POA: Diagnosis not present

## 2016-02-28 DIAGNOSIS — Z0000 Encounter for general adult medical examination without abnormal findings: Secondary | ICD-10-CM | POA: Diagnosis not present

## 2016-02-28 DIAGNOSIS — M810 Age-related osteoporosis without current pathological fracture: Secondary | ICD-10-CM | POA: Diagnosis not present

## 2016-02-28 DIAGNOSIS — I10 Essential (primary) hypertension: Secondary | ICD-10-CM | POA: Diagnosis not present

## 2016-02-28 DIAGNOSIS — E039 Hypothyroidism, unspecified: Secondary | ICD-10-CM | POA: Diagnosis not present

## 2016-03-01 ENCOUNTER — Encounter: Payer: Self-pay | Admitting: Orthopedic Surgery

## 2016-03-01 ENCOUNTER — Ambulatory Visit (INDEPENDENT_AMBULATORY_CARE_PROVIDER_SITE_OTHER): Payer: Medicare Other | Admitting: Orthopedic Surgery

## 2016-03-01 ENCOUNTER — Ambulatory Visit (INDEPENDENT_AMBULATORY_CARE_PROVIDER_SITE_OTHER): Payer: Medicare Other

## 2016-03-01 VITALS — BP 137/85 | HR 95 | Ht 62.0 in | Wt 223.0 lb

## 2016-03-01 DIAGNOSIS — M2141 Flat foot [pes planus] (acquired), right foot: Secondary | ICD-10-CM

## 2016-03-01 DIAGNOSIS — M2142 Flat foot [pes planus] (acquired), left foot: Secondary | ICD-10-CM

## 2016-03-01 DIAGNOSIS — Z96651 Presence of right artificial knee joint: Secondary | ICD-10-CM

## 2016-03-01 DIAGNOSIS — M129 Arthropathy, unspecified: Secondary | ICD-10-CM

## 2016-03-01 DIAGNOSIS — M25571 Pain in right ankle and joints of right foot: Secondary | ICD-10-CM | POA: Diagnosis not present

## 2016-03-01 DIAGNOSIS — M4806 Spinal stenosis, lumbar region: Secondary | ICD-10-CM

## 2016-03-01 DIAGNOSIS — M6789 Other specified disorders of synovium and tendon, multiple sites: Secondary | ICD-10-CM

## 2016-03-01 DIAGNOSIS — M5410 Radiculopathy, site unspecified: Secondary | ICD-10-CM | POA: Diagnosis not present

## 2016-03-01 DIAGNOSIS — M1712 Unilateral primary osteoarthritis, left knee: Secondary | ICD-10-CM

## 2016-03-01 DIAGNOSIS — M76829 Posterior tibial tendinitis, unspecified leg: Secondary | ICD-10-CM

## 2016-03-01 DIAGNOSIS — M48061 Spinal stenosis, lumbar region without neurogenic claudication: Secondary | ICD-10-CM

## 2016-03-01 MED ORDER — HYDROCODONE-ACETAMINOPHEN 5-325 MG PO TABS: 1.0000 | ORAL_TABLET | Freq: Three times a day (TID) | ORAL | 0 refills | Status: DC | PRN

## 2016-03-01 NOTE — Progress Notes (Signed)
Chief Complaint  Patient presents with  . Follow-up    bilateral knee pain  . Ankle Pain    right   HPI 70 year old female status post right total knee comes in complaining of bilateral knee pain and giving way, back pain decrease walking distance and right ankle pain  Recently twisted her left knee increased.giving way since then. Mild to moderate pain left knee  Moderate to severe pain lumbar spine   Review of Systems  Constitutional: Negative for chills and fever.  Musculoskeletal: Positive for back pain and myalgias.    Past Medical History:  Diagnosis Date  . Adenomatous colon polyp 2006   excised in 2006 & 2010Due surveillance 06/2013  . Anxiety   . Anxiety and depression   . Asthma   . Breast tumor   . Chest discomfort   . Chronic back pain   . Chronic constipation   . Complication of anesthesia   . Depression   . Diverticulosis   . Fibromyalgia   . Gastroesophageal reflux disease   . Hiatal hernia   . History of benign esophageal tumor 2010   granular cell esophageal tumor (Dx 06/2008), resected via EMR 2011, due repeat EGD 02/2012  . Hyperlipidemia   . Hypertension   . Insomnia   . Migraines   . PONV (postoperative nausea and vomiting)   . Psychosis   . Sleep apnea    Stop Bang score of 5. Pt said she was told by Dr. Merlene Laughter that she had "a little bit" of sleep apena, but not bad enough to treat.  . Thyroid nodule   . Tumor of esophagus     Past Surgical History:  Procedure Laterality Date  . ABDOMINAL HYSTERECTOMY    . BRAVO [place] STUDY  12/11/2011   Procedure: BRAVO [place];  Surgeon: Daneil Dolin, MD;  Location: [facility]ITE;  Service: Endoscopy;  Laterality: N/A;  . 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 CHOLECYSTECTOMY N/A 04/09/2013   Procedure: LAPAROSCOPIC CHOLECYSTECTOMY;  Surgeon: Jamesetta So, MD;  Location: [facility] ORS;  Service: General;  Laterality: N/A;  . COLONOSCOPY  06/2008, 06/2011   sigmoid  tics, tubular adenoma; 2013: anal canal hemorrhoids  . COLONOSCOPY  07/10/2011   Anal canal hemorrhoids likely the cause of hematochezia in the setting of constipation; otherwise normal rectum ;submucosal  petechiae in left colon of doubtful clinical significance; otherwise, normal colon  . ESOPHAGEAL DILATION N/A 05/08/2015   Procedure: ESOPHAGEAL DILATION;  Surgeon: Daneil Dolin, MD;  Location: [facility] ORS;  Service: Endoscopy;  Laterality: N/AVenia Minks 54/56  . ESOPHAGOGASTRODUODENOSCOPY  12/11/2011   [id number] Schatzki's ring; otherwise normal/Small hiatal hernia. Antral and bulbar erosions  . ESOPHAGOGASTRODUODENOSCOPY (EGD) WITH PROPOFOL N/A 05/08/2015   Dr. Gala Romney: mild erosive reflux esophagitis, non-critical Schatzki's ring s/p dilation. Hiatal hernia.   . EUS  08/2010   Dr [facility] with EGD. Retained food. No recurrent esophageal lesion, bx negative.  Marland Kitchen LUNG BIOPSY     negative  . PARATHYROIDECTOMY  April 2017   [facility].   Marland Kitchen RIGHT OOPHORECTOMY     benign disease  . SHOULDER ARTHROSCOPY     Right; bone spurs removed  . TONSILLECTOMY    . TOTAL KNEE ARTHROPLASTY  2002   Right; previous arthroscopic surgery   Family History  Problem Relation Age of Onset  . Stroke Father   . Alcohol abuse Father   . Heart attack Mother   . Depression  Mother   . Anxiety disorder Mother   . Colon cancer Paternal Aunt   . Colon cancer Paternal Uncle   . Dementia Maternal Uncle   . ADD / ADHD Neg Hx   . Bipolar disorder Neg Hx   . Drug abuse Neg Hx   . OCD Neg Hx   . Paranoid behavior Neg Hx   . Schizophrenia Neg Hx   . Seizures Neg Hx   . Sexual abuse Neg Hx   . Physical abuse Neg Hx    Social History  Substance Use Topics  . Smoking status: Never Smoker  . Smokeless tobacco: Never Used  . Alcohol use No   Current Meds  Medication Sig  . acetaminophen (TYLENOL) 500 MG tablet Take 500 mg by mouth every 8 (eight) hours as needed for mild pain or moderate pain.  Marland Kitchen albuterol  (PROVENTIL HFA;VENTOLIN HFA) 108 (90 BASE) MCG/ACT inhaler Inhale 2 puffs into the lungs every 6 (six) hours as needed. For shortness of breath  . amLODipine (NORVASC) 5 MG tablet   . atenolol (TENORMIN) 50 MG tablet Take 50 mg by mouth 2 (two) times daily.  Marland Kitchen CALCIUM-VITAMIN D PO Take by mouth daily.  . cholecalciferol (VITAMIN D) 1000 units tablet Take 1,000 Units by mouth daily. Reported on 10/24/2015  . clonazePAM (KLONOPIN) 1 MG tablet Take 1 tablet (1 mg total) by mouth 3 (three) times daily.  . fluticasone (FLONASE) 50 MCG/ACT nasal spray Place 1 spray into both nostrils 2 (two) times daily.  . hydrALAZINE (APRESOLINE) 50 MG tablet Take 50 mg by mouth 2 (two) times daily.  Marland Kitchen HYDROcodone-acetaminophen (NORCO/VICODIN) 5-325 MG tablet Take 1 tablet by mouth every 6 (six) hours as needed.  . linaclotide (LINZESS) 72 MCG capsule Take 1 capsule (72 mcg total) by mouth daily before breakfast.  . loratadine (CLARITIN) 10 MG tablet Take 10 mg by mouth daily. Reported on 12/19/2015  . losartan-hydrochlorothiazide (HYZAAR) 100-25 MG tablet Take 1 tablet by mouth daily.   . potassium chloride SA (K-DUR,KLOR-CON) 20 MEQ tablet Take by mouth.  . QUEtiapine (SEROQUEL) 100 MG tablet Take 1 tablet (100 mg total) by mouth at bedtime.  Marland Kitchen spironolactone (ALDACTONE) 25 MG tablet Take 25 mg by mouth daily.  . Vilazodone HCl (VIIBRYD) 20 MG TABS Take 1 tablet (20 mg total) by mouth daily.    BP 137/85   Pulse 95   Ht 5\' 2"  (1.575 m)   Wt 223 lb (101.2 kg)   BMI 40.79 kg/m   Physical Exam  Constitutional: She is oriented to person, place, and time. She appears well-developed and well-nourished. No distress.  Cardiovascular: Normal rate and intact distal pulses.   Neurological: She is alert and oriented to person, place, and time. She has normal reflexes. She exhibits normal muscle tone. Coordination normal.  Skin: Skin is warm and dry. No rash noted. She is not diaphoretic. No erythema. No pallor.   Psychiatric: She has a normal mood and affect. Her behavior is normal. Judgment and thought content normal.    Ortho Exam Poor gait pattern.  Left ankle 10 dorsiflexion 10 plantarflexion stable no tenderness but flexible flatfoot  Right ankle subtalar tenderness medial tenderness posterior tibial tendon tenderness and weakness with a total arc of motion of 20. Ligaments stable. Weakness of the posterior tibial tendon.  Left knee lateral joint line tenderness medial joint line tenderness knee flexion 100 small flexion contracture less than 5 knee stable strength normal muscle tone normal    ASSESSMENT:  My personal interpretation of the images:  Right ankle x-ray was normal    PLAN She can have hydrocodone for pain  She should use heat on her back. She declined epidurals. Recommend foot orthotic.  She was to come back in 3 months we injected her left knee  Procedure note left knee injection verbal consent was obtained to inject left knee joint  Timeout was completed to confirm the site of injection  The medications used were 40 mg of Depo-Medrol and 1% lidocaine 3 cc  Anesthesia was provided by ethyl chloride and the skin was prepped with alcohol.  After cleaning the skin with alcohol a 20-gauge needle was used to inject the left knee joint. There were no complications. A sterile bandage was applied.    Arther Abbott, MD 03/01/2016 8:40 AM  .meds

## 2016-03-01 NOTE — Patient Instructions (Signed)

## 2016-03-05 ENCOUNTER — Encounter (HOSPITAL_COMMUNITY): Payer: Self-pay | Admitting: Psychiatry

## 2016-03-05 ENCOUNTER — Ambulatory Visit (INDEPENDENT_AMBULATORY_CARE_PROVIDER_SITE_OTHER): Payer: Medicare Other | Admitting: Psychiatry

## 2016-03-05 DIAGNOSIS — F330 Major depressive disorder, recurrent, mild: Secondary | ICD-10-CM

## 2016-03-05 NOTE — Progress Notes (Signed)
     THERAPIST PROGRESS NOTE  Session Time:   Tuesday 03/05/2016 11:03 AM - 11:59 AM  Participation Level: Active  Behavioral Response: CasualAlertAnxious/ depressed/tearful  Type of Therapy: Individual Therapy  Treatment Goals :    1. Process grief and loss issues related to changed functioning and memory difficulty.      2. Use mindfulness and acceptance strategies to reduce experimental avoidance and increase value-based behavior.  Treatment Goals addressed:         1  Interventions: CBT and Supportive  Summary: [name] is a 70 y.o. female who is a returning patient to this clinician and is resuming services per her psychiatrist Dr. Harrington Challenger' recommendation. Patient has a long-standing history of recurrent periods of depression. She last was seen by this clinician in April 2015. Patient was hospitalized at [facility] from 04/25/2015 through 06/20/2015 due to losing her memory per her report. She says doctors have identified problems with her thyroid as the cause. She is scheduled to have thyroid removed on 10/17/2015. Patient is experiencing depressed mood and significant anxiety. She blames self for losing memory and fears she will lose her memory again. She avoids been around people as she is embarrassed she still cannot remember like she once did and does not function as she has in the past. She is anxious about having surgery and she fears she may die.  Patient reports increased depressed mood, increased memory difficulty, decreased interest in activities, and sleep difficulty since last session. She reports for family members have died recently. She reports increased frustration and sadness regarding her relationship with her sister as sister recently went out of town without informing patient. She continues to express sadness and frustration regarding her changed functioning especially her memory. She reports recent incident in church where she became very tearful when she was  unable to fully participate in this service due to memory difficulty. She also reports increased fear of driving along as she fears she will forget the way home. She is having difficulty writing due to injuring her arm several months ago.    Suicidal/Homicidal: No active suicidal ideations. However, patient reports she has experienced passive suicidal ideations with no plan and no intent. Patient agrees to call this practice, call 911, or have someone take her to the emergency room should symptoms worsen.      Therapist Response: Reviewed symptoms, facilitated expression of feelings, assisted patient process grief and loss issues related to changed functioning and recent loss of family members, discussed patient's relationship with her sister and ways to improve assertiveness communication with in the relationship, assisted patient identify realistic expectations regarding relationship with her sister   Plan: Return in 2-3 weeks.   Diagnosis: Axis I: MDD    Axis II: Deferred    Socrates Cahoon, LCSW 03/05/2016

## 2016-03-12 ENCOUNTER — Encounter (HOSPITAL_COMMUNITY): Payer: Self-pay | Admitting: Psychiatry

## 2016-03-12 ENCOUNTER — Ambulatory Visit (INDEPENDENT_AMBULATORY_CARE_PROVIDER_SITE_OTHER): Payer: Medicare Other | Admitting: Psychiatry

## 2016-03-12 VITALS — BP 120/64 | HR 61 | Ht 62.0 in | Wt 223.0 lb

## 2016-03-12 DIAGNOSIS — F330 Major depressive disorder, recurrent, mild: Secondary | ICD-10-CM

## 2016-03-12 MED ORDER — VILAZODONE HCL 40 MG PO TABS: 40.0000 mg | ORAL_TABLET | ORAL | 2 refills | Status: DC

## 2016-03-12 MED ORDER — QUETIAPINE FUMARATE 100 MG PO TABS: 100.0000 mg | ORAL_TABLET | Freq: Every day | ORAL | 2 refills | Status: DC

## 2016-03-12 MED ORDER — CLONAZEPAM 1 MG PO TABS: 1.0000 mg | ORAL_TABLET | Freq: Three times a day (TID) | ORAL | 2 refills | Status: DC

## 2016-03-12 NOTE — Progress Notes (Signed)
Patient ID: LEYLAH KNIERIM, female   DOB: [birth year], 70 y.o.   MRN: [id number] Patient ID: BERDINE MCCLARAN, female   DOB: [birth year], 70 y.o.   MRN: [id number] Patient ID: SAMIYA CARNS, female   DOB: [birth year], 70 y.o.   MRN: [id number] Patient ID: CORDELLA STINNER, female   DOB: 1946-03-25, 70 y.o.   MRN: [id number] Patient ID: DEAIRRA SPITLER, female   DOB: 1945-07-10, 70 y.o.   MRN: [id number] Patient ID: RAJVI LIUZZO, female   DOB: 11-09-1945, 70 y.o.   MRN: [id number] Patient ID: MARRISA PROVEAUX, female   DOB: Jun 10, 1946, 70 y.o.   MRN: [id number] Patient ID: KANDISE SESSUMS, female   DOB: July 15, 1945, 70 y.o.   MRN: [id number] Patient ID: TELAH SALEH, female   DOB: 1945-09-06, 70 y.o.   MRN: [id number] Patient ID: GLORIAJEAN RUEHLE, female   DOB: Jul 05, 1945, 70 y.o.   MRN: [id number] Patient ID: SHAYANN MEDLOCK, female   DOB: 1945/07/04, 70 y.o.   MRN: [id number] Patient ID: CORNESHA HOVER, female   DOB: 1946-03-30, 70 y.o.   MRN: [id number] Patient ID: ARIETTA TRONCOSO, female   DOB: 12-Oct-1945, 70 y.o.   MRN: [id number] Patient ID: KHYRIE DOLEMAN, female   DOB: 1945-08-21, 70 y.o.   MRN: [id number] Patient ID: CING KUNDA, female   DOB: 1946/04/09, 70 y.o.   MRN: [id number] Patient ID: JANIELIS DOLLOFF, female   DOB: [birth year], 70 y.o.   MRN: [id number] [facility] [id number] Progress Note NAUDIA GALE MRN: [id number] DOB: Dec 24, 1945 Age: 70 y.o.  Date: 03/12/2016 Start Time: 9:45 AM End Time: 10:10 AM  Chief Complaint: Chief Complaint  Patient presents with  . Depression  . Anxiety  . Follow-up    Subjective: "I' just got out of the hospital  This patient is a 70 year old black female who lives with her 25 year old daughter who has mild mental retardation. She [place] in [place]. She is a retired [employer]. The patient states that she's been depressed "all my life.". She can't give any specific reasons for the depression but notes that at age 74 she was already taking medication to help  with sleep. In her 81s her family doctor diagnosed with schizophrenia although she had no symptoms of paranoia or delusions or hallucinations. She was given medication for this.  Since 1997 she's been coming here with symptoms of depression. She's also had significant problems with fibromyalgia and fatigue. In Neurontin and Ambien have helped. Lately however she's become more depressed and worried. She has had gallbladder surgery and she's concerned about how is going to go. For some reason she's been thinking a lot about death. She doesn't have anyone to stay with her daughter something goes wrong. She's been more negative but is  not suicidal but her energy has dropped. In general she is a very functional person  The patient returns after 4 months. She is accompanied by her sister. She was in [facility] most of last month. Her sister lives in [place] and brought her there after she noticed the patient wasn't making sense see more paranoid and confused and couldn't take care of her basic needs. She stayed in the medical part of the hospital for about 3 weeks and then was transferred to psychiatry. She had a very thorough neurological workup including brain CT MRI and lumbar puncture all of which were noncontributory. It was thought she had a UTI which was treated. She was taken off gabapentin and Ambien.  She was tapered off Ativan and went home on no benzodiazepines except enough for 3 days. All she was in the hospital she was treated with Haldol but this was tapered off as well and she is only been discharged on Viibryd and trazodone to which she is allergic.  The patient is seen after 3 months. Her irritable bowel is really bothering her and her constipation is definitely a problem right now. She scheduled  To seeGI next month. She feels somewhat more depressed and numerous friends have died and she doesn't have anyone to talk to. She is seeing Maurice Small here for therapy on a regular basis people at her  church don't get along and I suggested she find somewhere else to go where she can feel supported. In the meantime we will increase her Viibryd to 40 mg as this may help her mood. She is having vivid dreams at night so I suggested she switch it to morning instead of bedtime Vitals: BP 120/64 (BP Location: Right Arm, Patient Position: Sitting, Cuff Size: Large)   Pulse 61   Ht 5\' 2"  (1.575 m)   Wt 223 lb (101.2 kg)   BMI 40.79 kg/m   Psychiatric history Patient has a long history of depression.  She has been seeing in this office since May 1997.  Patient denies any history of suicidal attempt however endorse chronic depression and anxiety.  In the past she has taken Prozac but did not like after taking few doses, she also has taken Seroquel, Abilify, Xanax, Pamelor, and amitriptyline.  Patient denies a history of paranoia or delusions however there are times when she has been very depressed and have a lot of negative symptoms.  Alcohol and substance use history Patient denies any history of alcohol or substance use.  Allergies: Allergies  Allergen Reactions  . Elavil [Amitriptyline] Other (See Comments)    Felt really nervous and felt like something was hold her feet or arm and/ or AM headache on it and back on it and went away off it.   . Abilify [Aripiprazole] Other (See Comments)    Dystonic reaction  . Trazodone And Nefazodone Other (See Comments)    Brought back bad dreams of things in the past  . Codeine Hives, Nausea Only and Other (See Comments)    Feels funny  . Latex Hives  . Penicillin V Itching  . Penicillins Hives    Has patient had a PCN reaction causing immediate rash, facial/tongue/throat swelling, SOB or lightheadedness with hypotension: Yes Has patient had a PCN reaction causing severe rash involving mucus membranes or skin necrosis: No Has patient had a PCN reaction that required hospitalization No Has patient had a PCN reaction occurring within the last 10 years:  No If all of the above answers are "NO", then may proceed with Cephalosporin use.   . Polyethylene Glycol Other (See Comments)    Per allergy test  . Sulfonamide Derivatives Nausea And Vomiting  . Cymbalta [Duloxetine Hcl] Rash  . Remeron [Mirtazapine] Other (See Comments)    Caused lots of strange, weird, crazy dreams.   Medical History: Past Medical History:  Diagnosis Date  . Adenomatous colon polyp 2006   excised in 2006 & 2010Due surveillance 06/2013  . Anxiety   . Anxiety and depression   . Asthma   . Breast tumor   . Chest discomfort   . Chronic back pain   . Chronic constipation   . Complication of anesthesia   . Depression   .  Diverticulosis   . Fibromyalgia   . Gastroesophageal reflux disease   . Hiatal hernia   . History of benign esophageal tumor 2010   granular cell esophageal tumor (Dx 06/2008), resected via EMR 2011, due repeat EGD 02/2012  . Hyperlipidemia   . Hypertension   . Insomnia   . Migraines   . PONV (postoperative nausea and vomiting)   . Psychosis   . Sleep apnea    Stop Bang score of 5. Pt said she was told by Dr. Merlene Laughter that she had "a little bit" of sleep apena, but not bad enough to treat.  . Thyroid nodule   . Tumor of esophagus   Patient has history of hypertension, hyperlipidemia, GERD, diverticulosis, colonic polyp, chronic back pain, fibromyalgia, chronic constipation, benign tumor of esophagus and chronic headache.  Her primary care physician is Dr. Legrand Rams, she sees Dr. Paulita Cradle for chronic pain, fibromyalgia and headache. Her cardiologist is Dr Elvera Maria. Surgical History: Past Surgical History:  Procedure Laterality Date  . ABDOMINAL HYSTERECTOMY    . BRAVO [place] STUDY  12/11/2011   Procedure: BRAVO [place];  Surgeon: Daneil Dolin, MD;  Location: [facility];  Service: Endoscopy;  Laterality: N/A;  . 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 CHOLECYSTECTOMY N/A 04/09/2013   Procedure:  LAPAROSCOPIC CHOLECYSTECTOMY;  Surgeon: Jamesetta So, MD;  Location: [facility] ORS;  Service: General;  Laterality: N/A;  . COLONOSCOPY  06/2008, 06/2011   sigmoid tics, tubular adenoma; 2013: anal canal hemorrhoids  . COLONOSCOPY  07/10/2011   Anal canal hemorrhoids likely the cause of hematochezia in the setting of constipation; otherwise normal rectum ;submucosal  petechiae in left colon of doubtful clinical significance; otherwise, normal colon  . ESOPHAGEAL DILATION N/A 05/08/2015   Procedure: ESOPHAGEAL DILATION;  Surgeon: Daneil Dolin, MD;  Location: [facility];  Service: Endoscopy;  Laterality: N/AVenia Minks 54/56  . ESOPHAGOGASTRODUODENOSCOPY  12/11/2011   [id number] Schatzki's ring; otherwise normal/Small hiatal hernia. Antral and bulbar erosions  . ESOPHAGOGASTRODUODENOSCOPY (EGD) WITH PROPOFOL N/A 05/08/2015   Dr. Gala Romney: mild erosive reflux esophagitis, non-critical Schatzki's ring s/p dilation. Hiatal hernia.   . EUS  08/2010   Dr [facility] with EGD. Retained food. No recurrent esophageal lesion, bx negative.  Marland Kitchen LUNG BIOPSY     negative  . PARATHYROIDECTOMY  April 2017   [facility].   Marland Kitchen RIGHT OOPHORECTOMY     benign disease  . SHOULDER ARTHROSCOPY     Right; bone spurs removed  . TONSILLECTOMY    . TOTAL KNEE ARTHROPLASTY  2002   Right; previous arthroscopic surgery   Family History: family history includes Alcohol abuse in her father; Anxiety disorder in her mother; Colon cancer in her paternal aunt and paternal uncle; Dementia in her maternal uncle; Depression in her mother; Heart attack in her mother; Stroke in her father. Reviewed and nothing new today again.  Mental status examination Patient is casually dressed and fairly groomed. She is calm cooperative and maintained fair eye contact. She described her mood as is Low but she is still little anxious She still has chronic pain  Her speech is coherent but slow.  Her thought processes clear and logical. She denies suicidal  ideation and no active or passive homicidal thoughts e. She denies any auditory or visual hallucination . She denies the paranoid delusions that she had in the past. There no psychotic symptoms present at this time. She's alert and oriented x3. Her insight  judgment impulse control is okay her fund of knowledge is fair and her language skills are good. He states that her short-term memory is quite poor  Lab Results:  Recent Results (from the past 8736 hour(s))  CBC with Differential/Platelet   Collection Time: 05/01/15  2:10 PM  Result Value Ref Range   WBC 4.6 4.0 - 10.5 K/uL   RBC 4.71 3.87 - 5.11 MIL/uL   Hemoglobin 12.5 12.0 - 15.0 g/dL   HCT 39.5 36.0 - 46.0 %   MCV 83.9 78.0 - 100.0 fL   MCH 26.5 26.0 - 34.0 pg   MCHC 31.6 30.0 - 36.0 g/dL   RDW 15.0 11.5 - 15.5 %   Platelets 188 150 - 400 K/uL   Neutrophils Relative % 53 %   Neutro Abs 2.4 1.7 - 7.7 K/uL   Lymphocytes Relative 39 %   Lymphs Abs 1.8 0.7 - 4.0 K/uL   Monocytes Relative 6 %   Monocytes Absolute 0.3 0.1 - 1.0 K/uL   Eosinophils Relative 2 %   Eosinophils Absolute 0.1 0.0 - 0.7 K/uL   Basophils Relative 0 %   Basophils Absolute 0.0 0.0 - 0.1 K/uL  Basic metabolic panel   Collection Time: 05/01/15  2:10 PM  Result Value Ref Range   Sodium 141 135 - 145 mmol/L   Potassium 3.9 3.5 - 5.1 mmol/L   Chloride 107 101 - 111 mmol/L   CO2 26 22 - 32 mmol/L   Glucose, Bld 95 65 - 99 mg/dL   BUN 10 6 - 20 mg/dL   Creatinine, Ser 1.13 (H) 0.44 - 1.00 mg/dL   Calcium 10.2 8.9 - 10.3 mg/dL   GFR calc non Af Amer 48 (L) >60 mL/min   GFR calc Af Amer 56 (L) >60 mL/min   Anion gap 8 5 - 15  Clostridium Difficile by PCR   Collection Time: 05/03/15 11:17 AM  Result Value Ref Range   Toxigenic C Difficile by pcr Not Detected Not Detected  COMPLETE METABOLIC PANEL WITH GFR   Collection Time: 07/26/15  9:34 AM  Result Value Ref Range   Sodium 140 135 - 146 mmol/L   Potassium 4.0 3.5 - 5.3 mmol/L   Chloride 108 98 - 110  mmol/L   CO2 23 20 - 31 mmol/L   Glucose, Bld 96 65 - 99 mg/dL   BUN 8 7 - 25 mg/dL   Creat 1.03 (H) 0.50 - 0.99 mg/dL   Total Bilirubin 0.5 0.2 - 1.2 mg/dL   Alkaline Phosphatase 48 33 - 130 U/L   AST 15 10 - 35 U/L   ALT 12 6 - 29 U/L   Total Protein 6.2 6.1 - 8.1 g/dL   Albumin 3.9 3.6 - 5.1 g/dL   Calcium 10.7 (H) 8.6 - 10.4 mg/dL   GFR, Est African American 64 >=60 mL/min   GFR, Est Non African American 56 (L) >=60 mL/min  IFOBT POC (occult bld, rslt in office)   Collection Time: 08/31/15  4:14 PM  Result Value Ref Range   IFOBT Negative   Basic Metabolic Panel (BMET)   Collection Time: 09/25/15  3:27 PM  Result Value Ref Range   Sodium 141 135 - 146 mmol/L   Potassium 3.1 (L) 3.5 - 5.3 mmol/L   Chloride 104 98 - 110 mmol/L   CO2 24 20 - 31 mmol/L   Glucose, Bld 114 (H) 65 - 99 mg/dL   BUN 9 7 - 25 mg/dL   Creat 0.93  0.50 - 0.99 mg/dL   Calcium 10.5 (H) 8.6 - 10.4 mg/dL  Magnesium   Collection Time: 09/25/15  3:27 PM  Result Value Ref Range   Magnesium 1.8 1.5 - 2.5 mg/dL  Basic Metabolic Panel (BMET)   Collection Time: 10/05/15  4:13 PM  Result Value Ref Range   Sodium 141 135 - 146 mmol/L   Potassium 3.9 3.5 - 5.3 mmol/L   Chloride 109 98 - 110 mmol/L   CO2 24 20 - 31 mmol/L   Glucose, Bld 96 65 - 99 mg/dL   BUN 7 7 - 25 mg/dL   Creat 0.90 0.50 - 0.99 mg/dL   Calcium 10.7 (H) 8.6 - 10.4 mg/dL  Urinalysis w microscopic + reflex cultur   Collection Time: 11/07/15  4:14 PM  Result Value Ref Range   Color, Urine YELLOW YELLOW   APPearance CLEAR CLEAR   Specific Gravity, Urine 1.005 1.001 - 1.035   pH 6.0 5.0 - 8.0   Glucose, UA NEGATIVE NEGATIVE   Bilirubin Urine NEGATIVE NEGATIVE   Ketones, ur NEGATIVE NEGATIVE   Hgb urine dipstick NEGATIVE NEGATIVE   Protein, ur NEGATIVE NEGATIVE   Nitrite NEGATIVE NEGATIVE   Leukocytes, UA 3+ (A) NEGATIVE   WBC, UA 20-40 (A) <=5 WBC/HPF   RBC / HPF 0-2 <=2 RBC/HPF   Squamous Epithelial / LPF 0-5 <=5 HPF   Bacteria,  UA NONE SEEN NONE SEEN HPF   Crystals NONE SEEN NONE SEEN HPF   Casts NONE SEEN NONE SEEN LPF   Yeast NONE SEEN NONE SEEN HPF  Urine culture   Collection Time: 11/07/15  4:14 PM  Result Value Ref Range   Colony Count NO GROWTH    Organism ID, Bacteria NO GROWTH   Basic Metabolic Panel (BMET)   Collection Time: 12/28/15  1:42 PM  Result Value Ref Range   Sodium 139 135 - 146 mmol/L   Potassium 4.1 3.5 - 5.3 mmol/L   Chloride 103 98 - 110 mmol/L   CO2 24 20 - 31 mmol/L   Glucose, Bld 82 65 - 99 mg/dL   BUN 17 7 - 25 mg/dL   Creat 1.37 (H) 0.50 - 0.99 mg/dL   Calcium 10.1 8.6 - 10.4 mg/dL  Basic Metabolic Panel (BMET)   Collection Time: 01/08/16 10:52 AM  Result Value Ref Range   Sodium 141 135 - 146 mmol/L   Potassium 3.5 3.5 - 5.3 mmol/L   Chloride 106 98 - 110 mmol/L   CO2 23 20 - 31 mmol/L   Glucose, Bld 103 (H) 65 - 99 mg/dL   BUN 17 7 - 25 mg/dL   Creat 1.29 (H) 0.50 - 0.99 mg/dL   Calcium 9.8 8.6 - 10.4 mg/dL   Review of systems is positive for bilateral knee pain  Assessment Axis I Maj. depressive disorder Axis II deferred Axis III see medical history Axis IV are to moderate.  Plan: I took her vitals.  I reviewed CC, tobacco/med/surg Hx, meds effects/ side effects, problem list, therapies and responses as well as current situation/symptoms discussed options. She will continue Viibryd but increase the dose to 40 mg every morning She will continue clonazepam 1 mg 3 times a day to help with the anxiety and Seroquel 100 mg at bedtime to help with anxiety sleep and confusion/paranoia. She will return in 6 weeks See orders and pt instructions for more details.  MEDICATIONS this encounter: Meds ordered this encounter  Medications  . ibuprofen (ADVIL,MOTRIN) 800 MG tablet  Sig: Take 800 mg by mouth 3 (three) times daily as needed.  . Vitamin D, Ergocalciferol, (DRISDOL) 50000 units CAPS capsule    Sig: Take 50,000 Units by mouth every 7 (seven) days.  Marland Kitchen topiramate  (TOPAMAX) 50 MG tablet    Sig: Take 100 mg by mouth daily.  . Vilazodone HCl (VIIBRYD) 40 MG TABS    Sig: Take 1 tablet (40 mg total) by mouth every morning.    Dispense:  30 tablet    Refill:  2  . QUEtiapine (SEROQUEL) 100 MG tablet    Sig: Take 1 tablet (100 mg total) by mouth at bedtime.    Dispense:  30 tablet    Refill:  2  . clonazePAM (KLONOPIN) 1 MG tablet    Sig: Take 1 tablet (1 mg total) by mouth 3 (three) times daily.    Dispense:  90 tablet    Refill:  2    Medical Decision Making Problem Points:  Established problem, stable/improving (1), New problem, with additional work-up planned (4), Review of last therapy session (1) and Review of psycho-social stressors (1) Data Points:  Review or order clinical lab tests (1) Review of new medications or change in dosage (2)  I certify that outpatient services furnished can reasonably be expected to improve the patient's condition.   Levonne Spiller, MD

## 2016-03-21 ENCOUNTER — Other Ambulatory Visit: Payer: Self-pay | Admitting: Cardiology

## 2016-03-29 ENCOUNTER — Ambulatory Visit (INDEPENDENT_AMBULATORY_CARE_PROVIDER_SITE_OTHER): Payer: Medicare Other | Admitting: Internal Medicine

## 2016-03-29 ENCOUNTER — Encounter: Payer: Self-pay | Admitting: Internal Medicine

## 2016-03-29 VITALS — BP 146/72 | HR 89 | Temp 97.4°F | Ht 62.0 in | Wt 224.8 lb

## 2016-03-29 DIAGNOSIS — Z86010 Personal history of colonic polyps: Secondary | ICD-10-CM | POA: Diagnosis not present

## 2016-03-29 DIAGNOSIS — Z8719 Personal history of other diseases of the digestive system: Secondary | ICD-10-CM

## 2016-03-29 NOTE — Progress Notes (Signed)
Primary Care Physician:  Rosita Fire, MD Primary Gastroenterologist:  Dr. Gala Romney  Pre-Procedure History & Physical: HPI:  [name] is a 70 y.o. female here for follow-up of constipation. History of tubular adenoma;  slated for stress colonoscopy 2018. Patient has wide swings in bowel function  -  describes [place] 2-6. Oftentimes, has to manually disimpact herself. If she takes even low dose Linzess says she has diarrhea. She stopped Linzess approx. 2 weeks ago;  now with no BM in 2 weeks -  has to manually remove stool with a finger. No bleeding. Intolerant to Amitiza;  took Stage manager for while but it was too harsh. MiraLax sporadically.  Norco recently added to her regimen.  Past Medical History:  Diagnosis Date  . Adenomatous colon polyp 2006   excised in 2006 & 2010Due surveillance 06/2013  . Anxiety   . Anxiety and depression   . Asthma   . Breast tumor   . Chest discomfort   . Chronic back pain   . Chronic constipation   . Complication of anesthesia   . Depression   . Diverticulosis   . Fibromyalgia   . Gastroesophageal reflux disease   . Hiatal hernia   . History of benign esophageal tumor 2010   granular cell esophageal tumor (Dx 06/2008), resected via EMR 2011, due repeat EGD 02/2012  . Hyperlipidemia   . Hypertension   . Insomnia   . Migraines   . PONV (postoperative nausea and vomiting)   . Psychosis   . Sleep apnea    Stop Bang score of 5. Pt said she was told by Dr. Merlene Laughter that she had "a little bit" of sleep apena, but not bad enough to treat.  . Thyroid nodule   . Tumor of esophagus     Past Surgical History:  Procedure Laterality Date  . ABDOMINAL HYSTERECTOMY    . BRAVO [place] STUDY  12/11/2011   Procedure: BRAVO [place];  Surgeon: Daneil Dolin, MD;  Location: [facility];  Service: Endoscopy;  Laterality: N/A;  . 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 CHOLECYSTECTOMY N/A 04/09/2013   Procedure:  LAPAROSCOPIC CHOLECYSTECTOMY;  Surgeon: Jamesetta So, MD;  Location: [facility];  Service: General;  Laterality: N/A;  . COLONOSCOPY  06/2008, 06/2011   sigmoid tics, tubular adenoma; 2013: anal canal hemorrhoids  . COLONOSCOPY  07/10/2011   Anal canal hemorrhoids likely the cause of hematochezia in the setting of constipation; otherwise normal rectum ;submucosal  petechiae in left colon of doubtful clinical significance; otherwise, normal colon  . ESOPHAGEAL DILATION N/A 05/08/2015   Procedure: ESOPHAGEAL DILATION;  Surgeon: Daneil Dolin, MD;  Location: [facility];  Service: Endoscopy;  Laterality: N/AVenia Minks 54/56  . ESOPHAGOGASTRODUODENOSCOPY  12/11/2011   [id number] Schatzki's ring; otherwise normal/Small hiatal hernia. Antral and bulbar erosions  . ESOPHAGOGASTRODUODENOSCOPY (EGD) WITH PROPOFOL N/A 05/08/2015   Dr. Gala Romney: mild erosive reflux esophagitis, non-critical Schatzki's ring s/p dilation. Hiatal hernia.   . EUS  08/2010   Dr [facility] with EGD. Retained food. No recurrent esophageal lesion, bx negative.  Marland Kitchen LUNG BIOPSY     negative  . PARATHYROIDECTOMY  April 2017   [facility].   Marland Kitchen RIGHT OOPHORECTOMY     benign disease  . SHOULDER ARTHROSCOPY     Right; bone spurs removed  . TONSILLECTOMY    . TOTAL KNEE ARTHROPLASTY  2002   Right; previous arthroscopic surgery    Prior to  Admission medications   Medication Sig Start Date End Date Taking? Authorizing Provider  albuterol (PROVENTIL HFA;VENTOLIN HFA) 108 (90 BASE) MCG/ACT inhaler Inhale 2 puffs into the lungs every 6 (six) hours as needed. For shortness of breath   Yes Historical Provider, MD  amLODipine (NORVASC) 5 MG tablet Take 10 mg by mouth daily.  12/18/15  Yes Historical Provider, MD  atenolol (TENORMIN) 50 MG tablet Take 50 mg by mouth 2 (two) times daily. Taking when not on Hydralazine HCL   Yes Historical Provider, MD  azithromycin (ZITHROMAX) 250 MG tablet Take by mouth daily.   Yes Historical Provider, MD    CALCIUM-VITAMIN D PO Take by mouth daily.   Yes Historical Provider, MD  cholecalciferol (VITAMIN D) 1000 units tablet Take 1,000 Units by mouth daily. Reported on 10/24/2015   Yes Historical Provider, MD  clonazePAM (KLONOPIN) 1 MG tablet Take 1 tablet (1 mg total) by mouth 3 (three) times daily. 03/12/16 03/12/17 Yes Cloria Spring, MD  fluticasone (FLONASE) 50 MCG/ACT nasal spray Place 1 spray into both nostrils 2 (two) times daily.   Yes Historical Provider, MD  furosemide (LASIX) 40 MG tablet TAKE 1 TABLET BY MOUTH DAILY AS NEEDED FOR SWELLING 03/21/16  Yes Lendon Colonel, NP  hydrALAZINE (APRESOLINE) 50 MG tablet Take 100 mg by mouth daily. Taking when not on Atenolol   Yes Historical Provider, MD  HYDROcodone-acetaminophen (NORCO/VICODIN) 5-325 MG tablet Take 1 tablet by mouth every 8 (eight) hours as needed. 03/01/16  Yes Carole Civil, MD  ibuprofen (ADVIL,MOTRIN) 800 MG tablet Take 800 mg by mouth 3 (three) times daily as needed.   Yes Historical Provider, MD  loratadine (CLARITIN) 10 MG tablet Take 10 mg by mouth daily. Reported on 12/19/2015   Yes Historical Provider, MD  losartan (COZAAR) 25 MG tablet Take 25 mg by mouth daily.   Yes Historical Provider, MD  methylPREDNISolone (MEDROL DOSEPAK) 4 MG TBPK tablet Take by mouth.   Yes Historical Provider, MD  potassium chloride SA (K-DUR,KLOR-CON) 20 MEQ tablet Take by mouth. 01/24/15  Yes Historical Provider, MD  QUEtiapine (SEROQUEL) 100 MG tablet Take 1 tablet (100 mg total) by mouth at bedtime. 03/12/16 03/12/17 Yes Cloria Spring, MD  spironolactone (ALDACTONE) 25 MG tablet Take 25 mg by mouth daily. 12/18/15  Yes Historical Provider, MD  topiramate (TOPAMAX) 50 MG tablet Take 100 mg by mouth daily.   Yes Historical Provider, MD  Vilazodone HCl (VIIBRYD) 40 MG TABS Take 1 tablet (40 mg total) by mouth every morning. 03/12/16  Yes Cloria Spring, MD  acetaminophen (TYLENOL) 500 MG tablet Take 500 mg by mouth every 8 (eight) hours as needed  for mild pain or moderate pain.    Historical Provider, MD  linaclotide Rolan Lipa) 72 MCG capsule Take 1 capsule (72 mcg total) by mouth daily before breakfast. Patient not taking: Reported on 03/29/2016 11/07/15   [name], NP  losartan-hydrochlorothiazide (HYZAAR) 100-25 MG tablet Take 1 tablet by mouth daily.  12/18/15   Historical Provider, MD  Vitamin D, Ergocalciferol, (DRISDOL) 50000 units CAPS capsule Take 50,000 Units by mouth every 7 (seven) days.    Historical Provider, MD    Allergies as of 03/29/2016 - Review Complete 03/29/2016  Allergen Reaction Noted  . Elavil [amitriptyline] Other (See Comments) 06/03/2012  . Abilify [aripiprazole] Other (See Comments) 11/25/2012  . Trazodone and nefazodone Other (See Comments) 08/31/2012  . Codeine Hives, Nausea Only, and Other (See Comments)   . Latex Hives  11/21/2011  . Penicillin v Itching 08/02/2013  . Penicillins Hives   . Polyethylene glycol Other (See Comments) 12/26/2012  . Sulfonamide derivatives Nausea And Vomiting   . Cymbalta [duloxetine hcl] Rash 03/10/2013  . Remeron [mirtazapine] Other (See Comments) 05/06/2012    Family History  Problem Relation Age of Onset  . Stroke Father   . Alcohol abuse Father   . Heart attack Mother   . Depression Mother   . Anxiety disorder Mother   . Colon cancer Paternal Aunt   . Colon cancer Paternal Uncle   . Dementia Maternal Uncle   . ADD / ADHD Neg Hx   . Bipolar disorder Neg Hx   . Drug abuse Neg Hx   . OCD Neg Hx   . Paranoid behavior Neg Hx   . Schizophrenia Neg Hx   . Seizures Neg Hx   . Sexual abuse Neg Hx   . Physical abuse Neg Hx     Social History   Social History  . Marital status: Single    Spouse name: N/A  . Number of children: 1  . Years of education: N/A   Occupational History  . disabled Retired   Social History Main Topics  . Smoking status: Never Smoker  . Smokeless tobacco: Never Used  . Alcohol use No  . Drug use: No  . Sexual activity: No     Other Topics Concern  . Not on file   Social History Narrative  . No narrative on file    Review of Systems: See HPI, otherwise negative ROS  Physical Exam: BP (!) 146/72   Pulse 89   Temp 97.4 F (36.3 C) (Oral)   Ht 5\' 2"  (1.575 m)   Wt 224 lb 12.8 oz (102 kg)   BMI 41.12 kg/m  General:   Alert,  Well-developed, well-nourished, pleasant and cooperative in NAD Skin:  Intact without significant lesions or rashes. Eyes:  Sclera clear, no icterus.   Conjunctiva pink. Ears:  Normal auditory acuity. Lungs:  Clear throughout to auscultation.   No wheezes, crackles, or rhonchi. No acute distress. Heart:  Regular rate and rhythm; no murmurs, clicks, rubs,  or gallops. Abdomen: Non-distended, normal bowel sounds.  Soft and nontender without appreciable mass or hepatosplenomegaly.  Pulses:  Normal pulses noted. Extremities:  Without clubbing or edema.  Impression:  Pleasant 70 year old lady with marked swings in bowel function. Likely has an element of Irritable bowel syndrome. Medications likely playing in the bowel symptoms. I told her that our goal was for her to not have any episodes of incontinence and for her good days to far out number her bad days. I have told her there is no cure for this entity.   Recommendations:  Start with a "clean slate". Will to a movie prep purge to clean her out so we can establish a new baseline. We'll add Benefiber 1 teaspoon twice daily to her regimen beginning October 9. She is to keep a stool diary. We'll plan to see her back in 4-6 weeks.  She is to let us known how things are going in a week from now. Regimen recommend today is subject to future adjustment. Plan for surveillance colonoscopy 2018.      Notice: This dictation was prepared with Dragon dictation along with smaller phrase technology. Any transcriptional errors that result from this process are unintentional and may not be corrected upon review.

## 2016-03-29 NOTE — Patient Instructions (Signed)
Stop Linzess  Colon purge with Colonoscopy prep - but we are not doing a colonoscopy for now  Keep a stool diary  Begin Benefiber 1 teaspoon twice daily on Monday, October 9th  OV with [place] in 4- 6 weeks  Colonoscopy next year

## 2016-04-01 ENCOUNTER — Ambulatory Visit (HOSPITAL_COMMUNITY): Payer: Self-pay | Admitting: Psychiatry

## 2016-04-01 ENCOUNTER — Other Ambulatory Visit: Payer: Self-pay | Admitting: Cardiology

## 2016-04-04 ENCOUNTER — Telehealth: Payer: Self-pay | Admitting: Orthopedic Surgery

## 2016-04-04 ENCOUNTER — Telehealth (HOSPITAL_COMMUNITY): Payer: Self-pay | Admitting: *Deleted

## 2016-04-04 NOTE — Telephone Encounter (Signed)
Hydrocodone-Acetaminophen  5/325mg   Qty  42 Tablets  Take 1 tablet by mouth every 8 (eight) hours as needed.

## 2016-04-04 NOTE — Telephone Encounter (Signed)
phone call from patient, said she lost her bottle of her medicine, the one she takes three times a day.   She could not remember the name of it.  She said Dr. Harrington Challenger would know which one it is.

## 2016-04-04 NOTE — Telephone Encounter (Signed)
Routing to Dr Harrison for approval 

## 2016-04-05 ENCOUNTER — Other Ambulatory Visit: Payer: Self-pay | Admitting: Orthopedic Surgery

## 2016-04-05 DIAGNOSIS — M5410 Radiculopathy, site unspecified: Secondary | ICD-10-CM

## 2016-04-05 MED ORDER — HYDROCODONE-ACETAMINOPHEN 5-325 MG PO TABS: 1.0000 | ORAL_TABLET | Freq: Three times a day (TID) | ORAL | 0 refills | Status: DC | PRN

## 2016-04-05 NOTE — Telephone Encounter (Signed)
phone call from patient, said she lost her bottle of her medicine, the one she takes three times a day.   She could not remember the name of it.  She said Dr. Harrington Challenger would know which one. (Klonopin).

## 2016-04-08 NOTE — Telephone Encounter (Signed)
You may call in enough to last until next appt 

## 2016-04-09 ENCOUNTER — Telehealth (HOSPITAL_COMMUNITY): Payer: Self-pay | Admitting: *Deleted

## 2016-04-09 NOTE — Telephone Encounter (Signed)
Opened in Error.

## 2016-04-09 NOTE — Telephone Encounter (Signed)
Called pt pharmacy and spoke with Jonni Sanger the pharmacist. Per Jonni Sanger, pt is just picked up [place] on 04-04-2016 and pt still have refills for this medication as well. Informed Jonni Sanger to cancel out this call for refill.

## 2016-04-22 ENCOUNTER — Encounter (HOSPITAL_COMMUNITY): Payer: Self-pay | Admitting: Psychiatry

## 2016-04-22 ENCOUNTER — Ambulatory Visit (INDEPENDENT_AMBULATORY_CARE_PROVIDER_SITE_OTHER): Payer: Medicare Other | Admitting: Psychiatry

## 2016-04-22 VITALS — BP 156/78 | HR 68 | Ht 62.0 in | Wt 217.4 lb

## 2016-04-22 DIAGNOSIS — F330 Major depressive disorder, recurrent, mild: Secondary | ICD-10-CM

## 2016-04-22 DIAGNOSIS — Z888 Allergy status to other drugs, medicaments and biological substances status: Secondary | ICD-10-CM

## 2016-04-22 DIAGNOSIS — Z880 Allergy status to penicillin: Secondary | ICD-10-CM | POA: Diagnosis not present

## 2016-04-22 MED ORDER — VENLAFAXINE HCL ER 75 MG PO CP24: 75.0000 mg | ORAL_CAPSULE | Freq: Every day | ORAL | 2 refills | Status: DC

## 2016-04-22 MED ORDER — QUETIAPINE FUMARATE 100 MG PO TABS: 100.0000 mg | ORAL_TABLET | Freq: Every day | ORAL | 2 refills | Status: DC

## 2016-04-22 NOTE — Progress Notes (Signed)
[name], female   DOB: [birth year], 70 y.o.   MRN: [id number] [name], female   DOB: Apr 16, 1946, 70 y.o.   MRN: [id number] [name], female   DOB: 04-07-1946, 70 y.o.   MRN: [id number] [name], female   DOB: 1945-09-06, 70 y.o.   MRN: [id number] [name], female   DOB: [birth year], 70 y.o.   MRN: [id number] [name], female   DOB: 06/22/1946, 70 y.o.   MRN: [id number] [name], female   DOB: 1946-03-10, 70 y.o.   MRN: [id number] [name], female   DOB: 05/23/1946, 70 y.o.   MRN: [id number] [name], female   DOB: [birth year], 70 y.o.   MRN: [id number] [name], female   DOB: 1946/01/27, 70 y.o.   MRN: [id number] [name], female   DOB: 01/26/1946, 70 y.o.   MRN: [id number] [name], female   DOB: [birth year], 70 y.o.   MRN: [id number] [name], female   DOB: 1946-04-03, 70 y.o.   MRN: [id number] [name], female   DOB: 08-23-1945, 70 y.o.   MRN: [id number] [name], female   DOB: 07-Aug-1945, 70 y.o.   MRN: [id number] [name], female   DOB: 06/11/1946, 70 y.o.   MRN: [id number] [facility] [id number] Progress Note [name] MRN: [id number] DOB: [birth year] Age: 70 y.o.  Date: 04/22/2016 Start Time: 9:45 AM End Time: 10:10 AM  Chief Complaint: Chief Complaint  Patient presents with  . Depression  . Anxiety  . Follow-up    Subjective: "I' just got out of the hospital  This patient is a 70 year old black female who lives with her 81 year old daughter who has mild mental retardation. She lives in [place]. She is a retired [employer]. The patient states that she's been depressed "all my life.". She can't give any specific reasons for the depression but notes that at age 36 she was already taking medication to  help with sleep. In her 75s her family doctor diagnosed with schizophrenia although she had no symptoms of paranoia or delusions or hallucinations. She was given medication for this.  Since 1997 she's been coming here with symptoms of depression. She's also had significant problems with fibromyalgia and fatigue. In Neurontin and Ambien have helped. Lately however she's become more depressed and worried. She has had gallbladder surgery and she's concerned about how is going to go. For some reason she's been thinking a lot about death. She doesn't have anyone to stay with her daughter something goes wrong. She's been more negative but is  not suicidal but her energy has dropped. In general she is a very functional person  The patient returns after 4 months. She is accompanied by her sister. She was in [facility] hospital most of last month. Her sister lives in [place] and brought her there after she noticed the patient wasn't making sense see more paranoid and confused and couldn't take care of her basic needs. She stayed in the medical part of the hospital for about 3 weeks and then was transferred to psychiatry. She had a very thorough neurological workup including brain CT MRI and lumbar puncture all of which were noncontributory. It was thought she had a UTI which was treated. She was taken off gabapentin and Ambien.  She was tapered off Ativan and went home on no benzodiazepines except enough for 3 days. All she was in the hospital she was treated with Haldol but this was tapered off as well and she is only been discharged on Viibryd and trazodone to which she is allergic.  The patient is seen after 3 months. She states that she stop the Viibryd because it wasn't helping her mood at all. Most of her problems seem to be medical and she reports having alternating constipation and diarrhea, urinary incontinence which keeps her up running to the bathroom all night problems with her right arm and difficulty writing. She  is also having short-term memory loss and sometimes even forgets where she is worse when she is driving. She tends to ramble and reiterate things and go in circles and she's bit hard to follow. She denies any thoughts of self-harm and denies auditory or visual hallucinations currently. I told her we could try another medication and looking back she's never been on Effexor so we will go ahead and start this today. Vitals: BP (!) 156/78 (BP Location: Right Arm, Patient Position: Sitting, Cuff Size: Large)   Pulse 68   Ht 5\' 2"  (1.575 m)   Wt 217 lb 6.4 oz (98.6 kg)   BMI 39.76 kg/m   Psychiatric history Patient has a long history of depression.  She has been seeing in this office since May 1997.  Patient denies any history of suicidal attempt however endorse chronic depression and anxiety.  In the past she has taken Prozac but did not like after taking few doses, she also has taken Seroquel, Abilify, Xanax, Pamelor, and amitriptyline.  Patient denies a history of paranoia or delusions however there are times when she has been very depressed and have a lot of negative symptoms.  Alcohol and substance use history Patient denies any history of alcohol or substance use.  Allergies: Allergies  Allergen Reactions  . Elavil [Amitriptyline] Other (See Comments)    Felt really nervous and felt like something was hold her feet or arm and/ or AM headache on it and back on it and went away off it.   . Abilify [Aripiprazole] Other (See Comments)    Dystonic reaction  . Trazodone And Nefazodone Other (See Comments)    Brought back bad dreams of things in the past  . Codeine Hives, Nausea Only and Other (See Comments)    Feels funny  . Latex Hives  . Penicillin V Itching  . Penicillins Hives    Has patient had a PCN reaction causing immediate rash, facial/tongue/throat swelling, SOB or lightheadedness with hypotension: Yes Has patient had a PCN reaction causing severe rash involving mucus membranes or  skin necrosis: No Has patient had a PCN reaction that required hospitalization No Has patient had a PCN reaction occurring within the last 10 years: No If all of the above answers are "NO", then may proceed with Cephalosporin use.   . Polyethylene Glycol Other (See Comments)    Per allergy test  . Sulfonamide Derivatives Nausea And Vomiting  . Cymbalta [Duloxetine Hcl] Rash  . Remeron [Mirtazapine] Other (See Comments)    Caused lots of strange, weird, crazy dreams.   Medical History: Past Medical History:  Diagnosis Date  . Adenomatous colon polyp 2006   excised in 2006 & 2010Due surveillance 06/2013  . Anxiety   . Anxiety and depression   . Asthma   . Breast tumor   . Chest discomfort   . Chronic  back pain   . Chronic constipation   . Complication of anesthesia   . Depression   . Diverticulosis   . Fibromyalgia   . Gastroesophageal reflux disease   . Hiatal hernia   . History of benign esophageal tumor 2010   granular cell esophageal tumor (Dx 06/2008), resected via EMR 2011, due repeat EGD 02/2012  . Hyperlipidemia   . Hypertension   . Insomnia   . Migraines   . PONV (postoperative nausea and vomiting)   . Psychosis   . Sleep apnea    Stop Bang score of 5. Pt said she was told by Dr. Merlene Laughter that she had "a little bit" of sleep apena, but not bad enough to treat.  . Thyroid nodule   . Tumor of esophagus   Patient has history of hypertension, hyperlipidemia, GERD, diverticulosis, colonic polyp, chronic back pain, fibromyalgia, chronic constipation, benign tumor of esophagus and chronic headache.  Her primary care physician is Dr. Legrand Rams, she sees Dr. Paulita Cradle for chronic pain, fibromyalgia and headache. Her cardiologist is Dr Elvera Maria. Surgical History: Past Surgical History:  Procedure Laterality Date  . ABDOMINAL HYSTERECTOMY    . BRAVO [place] STUDY  12/11/2011   Procedure: BRAVO [place];  Surgeon: Daneil Dolin, MD;  Location: [facility] SUITE;  Service: Endoscopy;   Laterality: N/A;  . 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 CHOLECYSTECTOMY N/A 04/09/2013   Procedure: LAPAROSCOPIC CHOLECYSTECTOMY;  Surgeon: Jamesetta So, MD;  Location: [facility] ORS;  Service: General;  Laterality: N/A;  . COLONOSCOPY  06/2008, 06/2011   sigmoid tics, tubular adenoma; 2013: anal canal hemorrhoids  . COLONOSCOPY  07/10/2011   Anal canal hemorrhoids likely the cause of hematochezia in the setting of constipation; otherwise normal rectum ;submucosal  petechiae in left colon of doubtful clinical significance; otherwise, normal colon  . ESOPHAGEAL DILATION N/A 05/08/2015   Procedure: ESOPHAGEAL DILATION;  Surgeon: Daneil Dolin, MD;  Location: [facility] ORS;  Service: Endoscopy;  Laterality: N/AVenia Minks 54/56  . ESOPHAGOGASTRODUODENOSCOPY  12/11/2011   [id number] Schatzki's ring; otherwise normal/Small hiatal hernia. Antral and bulbar erosions  . ESOPHAGOGASTRODUODENOSCOPY (EGD) WITH PROPOFOL N/A 05/08/2015   Dr. Gala Romney: mild erosive reflux esophagitis, non-critical Schatzki's ring s/p dilation. Hiatal hernia.   . EUS  08/2010   Dr [facility] with EGD. Retained food. No recurrent esophageal lesion, bx negative.  Marland Kitchen LUNG BIOPSY     negative  . PARATHYROIDECTOMY  April 2017   [facility].   Marland Kitchen RIGHT OOPHORECTOMY     benign disease  . SHOULDER ARTHROSCOPY     Right; bone spurs removed  . TONSILLECTOMY    . TOTAL KNEE ARTHROPLASTY  2002   Right; previous arthroscopic surgery   Family History: family history includes Alcohol abuse in her father; Anxiety disorder in her mother; Colon cancer in her paternal aunt and paternal uncle; Dementia in her maternal uncle; Depression in her mother; Heart attack in her mother; Stroke in her father. Reviewed and nothing new today again.  Mental status examination Patient is casually dressed and fairly groomed. She is calm cooperative and maintained fair eye contact. She described her mood as is Low but she is  still little anxious She still has chronic pain  Her speech is coherent but slow.  Her thought process is a bit confused. She denies suicidal ideation and no active or passive homicidal thoughts e. She denies any auditory or visual hallucination . She denies the paranoid delusions  that she had in the past. There no psychotic symptoms present at this time. She's alert and oriented x3. Her insight judgment impulse control is okay her fund of knowledge is fair and her language skills are good. He states that her short-term memory is quite poor  Lab Results:  Recent Results (from the past 8736 hour(s))  CBC with Differential/Platelet   Collection Time: 05/01/15  2:10 PM  Result Value Ref Range   WBC 4.6 4.0 - 10.5 K/uL   RBC 4.71 3.87 - 5.11 MIL/uL   Hemoglobin 12.5 12.0 - 15.0 g/dL   HCT 39.5 36.0 - 46.0 %   MCV 83.9 78.0 - 100.0 fL   MCH 26.5 26.0 - 34.0 pg   MCHC 31.6 30.0 - 36.0 g/dL   RDW 15.0 11.5 - 15.5 %   Platelets 188 150 - 400 K/uL   Neutrophils Relative % 53 %   Neutro Abs 2.4 1.7 - 7.7 K/uL   Lymphocytes Relative 39 %   Lymphs Abs 1.8 0.7 - 4.0 K/uL   Monocytes Relative 6 %   Monocytes Absolute 0.3 0.1 - 1.0 K/uL   Eosinophils Relative 2 %   Eosinophils Absolute 0.1 0.0 - 0.7 K/uL   Basophils Relative 0 %   Basophils Absolute 0.0 0.0 - 0.1 K/uL  Basic metabolic panel   Collection Time: 05/01/15  2:10 PM  Result Value Ref Range   Sodium 141 135 - 145 mmol/L   Potassium 3.9 3.5 - 5.1 mmol/L   Chloride 107 101 - 111 mmol/L   CO2 26 22 - 32 mmol/L   Glucose, Bld 95 65 - 99 mg/dL   BUN 10 6 - 20 mg/dL   Creatinine, Ser 1.13 (H) 0.44 - 1.00 mg/dL   Calcium 10.2 8.9 - 10.3 mg/dL   GFR calc non Af Amer 48 (L) >60 mL/min   GFR calc Af Amer 56 (L) >60 mL/min   Anion gap 8 5 - 15  Clostridium Difficile by PCR   Collection Time: 05/03/15 11:17 AM  Result Value Ref Range   Toxigenic C Difficile by pcr Not Detected Not Detected  COMPLETE METABOLIC PANEL WITH GFR   Collection  Time: 07/26/15  9:34 AM  Result Value Ref Range   Sodium 140 135 - 146 mmol/L   Potassium 4.0 3.5 - 5.3 mmol/L   Chloride 108 98 - 110 mmol/L   CO2 23 20 - 31 mmol/L   Glucose, Bld 96 65 - 99 mg/dL   BUN 8 7 - 25 mg/dL   Creat 1.03 (H) 0.50 - 0.99 mg/dL   Total Bilirubin 0.5 0.2 - 1.2 mg/dL   Alkaline Phosphatase 48 33 - 130 U/L   AST 15 10 - 35 U/L   ALT 12 6 - 29 U/L   Total Protein 6.2 6.1 - 8.1 g/dL   Albumin 3.9 3.6 - 5.1 g/dL   Calcium 10.7 (H) 8.6 - 10.4 mg/dL   GFR, Est African American 64 >=60 mL/min   GFR, Est Non African American 56 (L) >=60 mL/min  IFOBT POC (occult bld, rslt in office)   Collection Time: 08/31/15  4:14 PM  Result Value Ref Range   IFOBT Negative   Basic Metabolic Panel (BMET)   Collection Time: 09/25/15  3:27 PM  Result Value Ref Range   Sodium 141 135 - 146 mmol/L   Potassium 3.1 (L) 3.5 - 5.3 mmol/L   Chloride 104 98 - 110 mmol/L   CO2 24 20 - 31 mmol/L  Glucose, Bld 114 (H) 65 - 99 mg/dL   BUN 9 7 - 25 mg/dL   Creat 0.93 0.50 - 0.99 mg/dL   Calcium 10.5 (H) 8.6 - 10.4 mg/dL  Magnesium   Collection Time: 09/25/15  3:27 PM  Result Value Ref Range   Magnesium 1.8 1.5 - 2.5 mg/dL  Basic Metabolic Panel (BMET)   Collection Time: 10/05/15  4:13 PM  Result Value Ref Range   Sodium 141 135 - 146 mmol/L   Potassium 3.9 3.5 - 5.3 mmol/L   Chloride 109 98 - 110 mmol/L   CO2 24 20 - 31 mmol/L   Glucose, Bld 96 65 - 99 mg/dL   BUN 7 7 - 25 mg/dL   Creat 0.90 0.50 - 0.99 mg/dL   Calcium 10.7 (H) 8.6 - 10.4 mg/dL  Urinalysis w microscopic + reflex cultur   Collection Time: 11/07/15  4:14 PM  Result Value Ref Range   Color, Urine YELLOW YELLOW   APPearance CLEAR CLEAR   Specific Gravity, Urine 1.005 1.001 - 1.035   pH 6.0 5.0 - 8.0   Glucose, UA NEGATIVE NEGATIVE   Bilirubin Urine NEGATIVE NEGATIVE   Ketones, ur NEGATIVE NEGATIVE   Hgb urine dipstick NEGATIVE NEGATIVE   Protein, ur NEGATIVE NEGATIVE   Nitrite NEGATIVE NEGATIVE    Leukocytes, UA 3+ (A) NEGATIVE   WBC, UA 20-40 (A) <=5 WBC/HPF   RBC / HPF 0-2 <=2 RBC/HPF   Squamous Epithelial / LPF 0-5 <=5 HPF   Bacteria, UA NONE SEEN NONE SEEN HPF   Crystals NONE SEEN NONE SEEN HPF   Casts NONE SEEN NONE SEEN LPF   Yeast NONE SEEN NONE SEEN HPF  Urine culture   Collection Time: 11/07/15  4:14 PM  Result Value Ref Range   Colony Count NO GROWTH    Organism ID, Bacteria NO GROWTH   Basic Metabolic Panel (BMET)   Collection Time: 12/28/15  1:42 PM  Result Value Ref Range   Sodium 139 135 - 146 mmol/L   Potassium 4.1 3.5 - 5.3 mmol/L   Chloride 103 98 - 110 mmol/L   CO2 24 20 - 31 mmol/L   Glucose, Bld 82 65 - 99 mg/dL   BUN 17 7 - 25 mg/dL   Creat 1.37 (H) 0.50 - 0.99 mg/dL   Calcium 10.1 8.6 - 10.4 mg/dL  Basic Metabolic Panel (BMET)   Collection Time: 01/08/16 10:52 AM  Result Value Ref Range   Sodium 141 135 - 146 mmol/L   Potassium 3.5 3.5 - 5.3 mmol/L   Chloride 106 98 - 110 mmol/L   CO2 23 20 - 31 mmol/L   Glucose, Bld 103 (H) 65 - 99 mg/dL   BUN 17 7 - 25 mg/dL   Creat 1.29 (H) 0.50 - 0.99 mg/dL   Calcium 9.8 8.6 - 10.4 mg/dL   Review of systems is positive for bilateral knee pain  Assessment Axis I Maj. depressive disorder Axis II deferred Axis III see medical history Axis IV are to moderate.  Plan: I took her vitals.  I reviewed CC, tobacco/med/surg Hx, meds effects/ side effects, problem list, therapies and responses as well as current situation/symptoms discussed options. She will start Effexor XR 75 mg every morning She will continue clonazepam 1 mg 3 times a day to help with the anxiety and Seroquel 100 mg at bedtime to help with anxiety sleep and confusion/paranoia. She will return in 4 weeks See orders and pt instructions for more details.  MEDICATIONS  this encounter: Meds ordered this encounter  Medications  . venlafaxine XR (EFFEXOR XR) 75 MG 24 hr capsule    Sig: Take 1 capsule (75 mg total) by mouth daily with breakfast.     Dispense:  30 capsule    Refill:  2  . QUEtiapine (SEROQUEL) 100 MG tablet    Sig: Take 1 tablet (100 mg total) by mouth at bedtime.    Dispense:  30 tablet    Refill:  2    Medical Decision Making Problem Points:  Established problem, stable/improving (1), New problem, with additional work-up planned (4), Review of last therapy session (1) and Review of psycho-social stressors (1) Data Points:  Review or order clinical lab tests (1) Review of new medications or change in dosage (2)  I certify that outpatient services furnished can reasonably be expected to improve the patient's condition.   Levonne Spiller, MD

## 2016-05-14 ENCOUNTER — Encounter: Payer: Self-pay | Admitting: Internal Medicine

## 2016-05-14 ENCOUNTER — Ambulatory Visit (HOSPITAL_COMMUNITY): Payer: Self-pay | Admitting: Psychiatry

## 2016-05-14 ENCOUNTER — Ambulatory Visit (INDEPENDENT_AMBULATORY_CARE_PROVIDER_SITE_OTHER): Payer: Medicare Other | Admitting: Internal Medicine

## 2016-05-14 VITALS — BP 104/74 | HR 90 | Temp 98.0°F | Ht 62.0 in | Wt 209.6 lb

## 2016-05-14 DIAGNOSIS — Z86010 Personal history of colonic polyps: Secondary | ICD-10-CM | POA: Diagnosis not present

## 2016-05-14 DIAGNOSIS — K5909 Other constipation: Secondary | ICD-10-CM | POA: Diagnosis not present

## 2016-05-14 DIAGNOSIS — K219 Gastro-esophageal reflux disease without esophagitis: Secondary | ICD-10-CM

## 2016-05-14 MED ORDER — PANTOPRAZOLE SODIUM 40 MG PO TBEC: 40.0000 mg | DELAYED_RELEASE_TABLET | Freq: Every day | ORAL | 11 refills | Status: DC

## 2016-05-14 NOTE — Progress Notes (Signed)
Primary Care Physician:  Rosita Fire, MD Primary Gastroenterologist:  Dr. Gala Romney  Pre-Procedure History & Physical: HPI:  [name] is a 70 y.o. female here for follow-up of GERD and constipation. Has been taking Benefiber off and on - bowel function has normalized after PEG colon purge. Has 1-2 formed bowel movements daily. She is happy with her progress. Not on acid suppression therapy at this time. Apparently, came off last year and she had C. difficile. Has occasional reflux symptoms are vague recurrent esophageal dysphagia -  intermittently at times-history of Schatzki's -  ring dilated last year. Overall,  doing well. History of colonic adenoma-due for surveillance examination 2018.  Past Medical History:  Diagnosis Date  . Adenomatous colon polyp 2006   excised in 2006 & 2010Due surveillance 06/2013  . Anxiety   . Anxiety and depression   . Asthma   . Breast tumor   . Chest discomfort   . Chronic back pain   . Chronic constipation   . Complication of anesthesia   . Depression   . Diverticulosis   . Fibromyalgia   . Gastroesophageal reflux disease   . Hiatal hernia   . History of benign esophageal tumor 2010   granular cell esophageal tumor (Dx 06/2008), resected via EMR 2011, due repeat EGD 02/2012  . Hyperlipidemia   . Hypertension   . Insomnia   . Migraines   . PONV (postoperative nausea and vomiting)   . Psychosis   . Sleep apnea    Stop Bang score of 5. Pt said she was told by Dr. Merlene Laughter that she had "a little bit" of sleep apena, but not bad enough to treat.  . Thyroid nodule   . Tumor of esophagus     Past Surgical History:  Procedure Laterality Date  . ABDOMINAL HYSTERECTOMY    . BRAVO [place] STUDY  12/11/2011   Procedure: BRAVO [place];  Surgeon: Daneil Dolin, MD;  Location: [facility];  Service: Endoscopy;  Laterality: N/A;  . 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 CHOLECYSTECTOMY N/A 04/09/2013   Procedure: LAPAROSCOPIC CHOLECYSTECTOMY;  Surgeon: Jamesetta So, MD;  Location: [facility] ORS;  Service: General;  Laterality: N/A;  . COLONOSCOPY  06/2008, 06/2011   sigmoid tics, tubular adenoma; 2013: anal canal hemorrhoids  . COLONOSCOPY  07/10/2011   Anal canal hemorrhoids likely the cause of hematochezia in the setting of constipation; otherwise normal rectum ;submucosal  petechiae in left colon of doubtful clinical significance; otherwise, normal colon  . ESOPHAGEAL DILATION N/A 05/08/2015   Procedure: ESOPHAGEAL DILATION;  Surgeon: Daneil Dolin, MD;  Location: [facility];  Service: Endoscopy;  Laterality: N/AVenia Minks 54/56  . ESOPHAGOGASTRODUODENOSCOPY  12/11/2011   [id number] Schatzki's ring; otherwise normal/Small hiatal hernia. Antral and bulbar erosions  . ESOPHAGOGASTRODUODENOSCOPY (EGD) WITH PROPOFOL N/A 05/08/2015   Dr. Gala Romney: mild erosive reflux esophagitis, non-critical Schatzki's ring s/p dilation. Hiatal hernia.   . EUS  08/2010   Dr [facility] with EGD. Retained food. No recurrent esophageal lesion, bx negative.  Marland Kitchen LUNG BIOPSY     negative  . PARATHYROIDECTOMY  April 2017   [facility].   Marland Kitchen RIGHT OOPHORECTOMY     benign disease  . SHOULDER ARTHROSCOPY     Right; bone spurs removed  . TONSILLECTOMY    . TOTAL KNEE ARTHROPLASTY  2002   Right; previous arthroscopic surgery    Prior to Admission medications   Medication Sig Start Date  End Date Taking? Authorizing Provider  acetaminophen (TYLENOL) 500 MG tablet Take 500 mg by mouth every 8 (eight) hours as needed for mild pain or moderate pain.   Yes Historical Provider, MD  albuterol (PROVENTIL HFA;VENTOLIN HFA) 108 (90 BASE) MCG/ACT inhaler Inhale 2 puffs into the lungs every 6 (six) hours as needed. For shortness of breath   Yes Historical Provider, MD  amLODipine (NORVASC) 5 MG tablet Take 10 mg by mouth daily.  12/18/15  Yes Historical Provider, MD  atenolol (TENORMIN) 50 MG tablet Take 50 mg by mouth 2 (two) times daily.  Taking when not on Hydralazine HCL   Yes Historical Provider, MD  CALCIUM-VITAMIN D PO Take by mouth daily.   Yes Historical Provider, MD  cholecalciferol (VITAMIN D) 1000 units tablet Take 1,000 Units by mouth daily. Reported on 10/24/2015   Yes Historical Provider, MD  clonazePAM (KLONOPIN) 1 MG tablet Take 1 tablet (1 mg total) by mouth 3 (three) times daily. 03/12/16 03/12/17 Yes Cloria Spring, MD  fluticasone (FLONASE) 50 MCG/ACT nasal spray Place 1 spray into both nostrils 2 (two) times daily.   Yes Historical Provider, MD  furosemide (LASIX) 40 MG tablet TAKE 1 TABLET BY MOUTH DAILY AS NEEDED FOR SWELLING 03/21/16  Yes Lendon Colonel, NP  hydrALAZINE (APRESOLINE) 50 MG tablet Take 100 mg by mouth daily. Taking when not on Atenolol   Yes Historical Provider, MD  HYDROcodone-acetaminophen (NORCO/VICODIN) 5-325 MG tablet Take 1 tablet by mouth every 8 (eight) hours as needed. 04/05/16  Yes Carole Civil, MD  ibuprofen (ADVIL,MOTRIN) 800 MG tablet Take 800 mg by mouth 3 (three) times daily as needed.   Yes Historical Provider, MD  loratadine (CLARITIN) 10 MG tablet Take 10 mg by mouth daily. Reported on 12/19/2015   Yes Historical Provider, MD  losartan (COZAAR) 25 MG tablet Take 25 mg by mouth daily.   Yes Historical Provider, MD  MYRBETRIQ 25 MG TB24 tablet Take 1 tablet by mouth daily. 05/07/16  Yes Historical Provider, MD  QUEtiapine (SEROQUEL) 100 MG tablet Take 1 tablet (100 mg total) by mouth at bedtime. 04/22/16 04/22/17 Yes Cloria Spring, MD  spironolactone (ALDACTONE) 25 MG tablet Take 25 mg by mouth daily. 12/18/15  Yes Historical Provider, MD  topiramate (TOPAMAX) 50 MG tablet Take 100 mg by mouth daily.   Yes Historical Provider, MD  venlafaxine XR (EFFEXOR XR) 75 MG 24 hr capsule Take 1 capsule (75 mg total) by mouth daily with breakfast. 04/22/16 04/22/17 Yes Cloria Spring, MD  Vitamin D, Ergocalciferol, (DRISDOL) 50000 units CAPS capsule Take 50,000 Units by mouth every 7  (seven) days.   Yes Historical Provider, MD  azithromycin (ZITHROMAX) 250 MG tablet Take by mouth daily.    Historical Provider, MD  methylPREDNISolone (MEDROL DOSEPAK) 4 MG TBPK tablet Take by mouth.    Historical Provider, MD  potassium chloride SA (K-DUR,KLOR-CON) 20 MEQ tablet Take by mouth. 01/24/15   Historical Provider, MD    Allergies as of 05/14/2016 - Review Complete 05/14/2016  Allergen Reaction Noted  . Elavil [amitriptyline] Other (See Comments) 06/03/2012  . Abilify [aripiprazole] Other (See Comments) 11/25/2012  . Trazodone and nefazodone Other (See Comments) 08/31/2012  . Codeine Hives, Nausea Only, and Other (See Comments)   . Latex Hives 11/21/2011  . Penicillin v Itching 08/02/2013  . Penicillins Hives   . Polyethylene glycol Other (See Comments) 12/26/2012  . Sulfonamide derivatives Nausea And Vomiting   . Cymbalta [duloxetine hcl] Rash 03/10/2013  .  Remeron [mirtazapine] Other (See Comments) 05/06/2012    Family History  Problem Relation Age of Onset  . Stroke Father   . Alcohol abuse Father   . Heart attack Mother   . Depression Mother   . Anxiety disorder Mother   . Colon cancer Paternal Aunt   . Colon cancer Paternal Uncle   . Dementia Maternal Uncle   . ADD / ADHD Neg Hx   . Bipolar disorder Neg Hx   . Drug abuse Neg Hx   . OCD Neg Hx   . Paranoid behavior Neg Hx   . Schizophrenia Neg Hx   . Seizures Neg Hx   . Sexual abuse Neg Hx   . Physical abuse Neg Hx     Social History   Social History  . Marital status: Single    Spouse name: N/A  . Number of children: 1  . Years of education: N/A   Occupational History  . disabled Retired   Social History Main Topics  . Smoking status: Never Smoker  . Smokeless tobacco: Never Used  . Alcohol use No  . Drug use: No  . Sexual activity: No   Other Topics Concern  . Not on file   Social History Narrative  . No narrative on file    Review of Systems: See HPI, otherwise negative  ROS  Physical Exam: BP 104/74   Pulse 90   Temp 98 F (36.7 C) (Oral)   Ht 5\' 2"  (1.575 m)   Wt 209 lb 9.6 oz (95.1 kg)   BMI 38.34 kg/m  General:   Alert,  Well-developed, well-nourished, pleasant and cooperative in NAD Skin:  Intact without significant lesions or rashes.   Impression:  70 year old lady with chronic constipation responding nicely to regular fiber supplementation at this time. History of tubular adenoma; due for surveillance exam 2018. Recurrent GERD symptoms and vague esophageal dysphagia in the setting of a known Schatzki's ring. Currently off acid suppression therapy.   Recommendations:  Begin Protonix 40 mg daily for GERD; Disp 30 with 11 refills  Continue Benefiber 1 teaspoon twice daly  OV in 3 mos to set up a surveillance colonoscoopy  GERD information provided     Notice: This dictation was prepared with Dragon dictation along with smaller phrase technology. Any transcriptional errors that result from this process are unintentional and may not be corrected upon review.

## 2016-05-14 NOTE — Patient Instructions (Signed)
Begin Protonix 40 mg daily for GERD; Disp 30 with 11 refills  Continue Benefiber 1 teaspoon twice daly  OV in 3 mos to set up a surveillance colonoscoopy  GERD information provided

## 2016-05-21 ENCOUNTER — Encounter (HOSPITAL_COMMUNITY): Payer: Self-pay | Admitting: Psychiatry

## 2016-05-21 ENCOUNTER — Ambulatory Visit (INDEPENDENT_AMBULATORY_CARE_PROVIDER_SITE_OTHER): Payer: Medicare Other | Admitting: Psychiatry

## 2016-05-21 DIAGNOSIS — F330 Major depressive disorder, recurrent, mild: Secondary | ICD-10-CM

## 2016-05-21 NOTE — Progress Notes (Signed)
    THERAPIST PROGRESS NOTE  Session Time:   Tuesday 05/21/2016 10:18 AM - 11:10 AM  Participation Level: Active  Behavioral Response: CasualAlertAnxious/ depressed/tearful  Type of Therapy: Individual Therapy  Treatment Goals :    1. Process grief and loss issues related to changed functioning and memory difficulty.      2. Use mindfulness and acceptance strategies to reduce experimental avoidance and increase value-based behavior.  Treatment Goals addressed:         1  Interventions: CBT and Supportive  Summary: [name] is a 70 y.o. female who is a returning patient to this clinician and is resuming services per her psychiatrist Dr. Harrington Challenger' recommendation. Patient has a long-standing history of recurrent periods of depression. She last was seen by this clinician in April 2015. Patient was hospitalized at [facility] from 04/25/2015 through 06/20/2015 due to losing her memory per her report. She says doctors have identified problems with her thyroid as the cause. She is scheduled to have thyroid removed on 10/17/2015. Patient is experiencing depressed mood and significant anxiety. She blames self for losing memory and fears she will lose her memory again. She avoids been around people as she is embarrassed she still cannot remember like she once did and does not function as she has in the past. She is anxious about having surgery and she fears she may die.  Patient last was seen about 2 1/2 months ago. She reports increased depressed mood and anxiety along with continued memory difficulty.  She is considering stopping driving as she reports increased difficulty seeing. She expresses frustration and sadness as this decreases her independence and she is worried about transportation. She expresses disappointment in family and friends. She is pleased her daughter has been very supportive and nurturing.      Suicidal/Homicidal: No   Therapist Response: Reviewed symptoms, facilitated  expression of feelings, assisted patient process grief and loss issues related to changed functioning, explored community resources and options regarding transportation alternatives, assisted patient identify changes in the relationship with her daughter including advantages and disadvantages, assisted patient identify coping statements.   Plan: Return in 3-4 weeks.   Diagnosis: Axis I: MDD    Axis II: Deferred    Suleiman Finigan, LCSW 05/21/2016

## 2016-06-03 ENCOUNTER — Encounter: Payer: Self-pay | Admitting: Orthopedic Surgery

## 2016-06-03 ENCOUNTER — Ambulatory Visit: Payer: Self-pay | Admitting: Orthopedic Surgery

## 2016-06-04 ENCOUNTER — Encounter: Payer: Self-pay | Admitting: Internal Medicine

## 2016-06-18 ENCOUNTER — Ambulatory Visit (HOSPITAL_COMMUNITY): Payer: Self-pay | Admitting: Psychiatry

## 2016-06-25 ENCOUNTER — Ambulatory Visit (HOSPITAL_COMMUNITY): Payer: Medicare Other | Admitting: Psychiatry

## 2016-06-25 ENCOUNTER — Encounter (HOSPITAL_COMMUNITY): Payer: Self-pay | Admitting: *Deleted

## 2016-06-25 ENCOUNTER — Telehealth (HOSPITAL_COMMUNITY): Payer: Self-pay | Admitting: *Deleted

## 2016-06-25 DIAGNOSIS — R300 Dysuria: Secondary | ICD-10-CM | POA: Diagnosis not present

## 2016-06-25 DIAGNOSIS — I10 Essential (primary) hypertension: Secondary | ICD-10-CM | POA: Diagnosis not present

## 2016-06-25 NOTE — Telephone Encounter (Signed)
Pt called to resch her appt for today. Per pt she stated that she fell on Friday and forgot about her appt. Per pt she knew she had an appt for Jan but just could not remember when. Asked pt if she called her PCP to tell them and pt stated she did not call anyone. Asked pt if she would like to call her PCP to let them know and pt hesitatedd and stated I'll call them later on today. When office gived pt her new time and date and asked her to please write it down, pt stated it wont do her any good if she falls again and verbalized appt time and date back to staff.

## 2016-06-25 NOTE — Telephone Encounter (Signed)
noted 

## 2016-07-04 ENCOUNTER — Ambulatory Visit (HOSPITAL_COMMUNITY): Payer: Medicare Other | Admitting: Psychiatry

## 2016-07-12 ENCOUNTER — Encounter: Payer: Self-pay | Admitting: Orthopedic Surgery

## 2016-07-12 ENCOUNTER — Ambulatory Visit: Payer: Self-pay | Admitting: Orthopedic Surgery

## 2016-07-16 DIAGNOSIS — G3184 Mild cognitive impairment, so stated: Secondary | ICD-10-CM | POA: Diagnosis not present

## 2016-07-16 DIAGNOSIS — G43719 Chronic migraine without aura, intractable, without status migrainosus: Secondary | ICD-10-CM | POA: Diagnosis not present

## 2016-07-16 DIAGNOSIS — M542 Cervicalgia: Secondary | ICD-10-CM | POA: Diagnosis not present

## 2016-07-16 DIAGNOSIS — Z79891 Long term (current) use of opiate analgesic: Secondary | ICD-10-CM | POA: Diagnosis not present

## 2016-07-16 DIAGNOSIS — Z79899 Other long term (current) drug therapy: Secondary | ICD-10-CM | POA: Diagnosis not present

## 2016-07-16 DIAGNOSIS — M797 Fibromyalgia: Secondary | ICD-10-CM | POA: Diagnosis not present

## 2016-07-16 DIAGNOSIS — G894 Chronic pain syndrome: Secondary | ICD-10-CM | POA: Diagnosis not present

## 2016-07-18 ENCOUNTER — Telehealth (HOSPITAL_COMMUNITY): Payer: Self-pay | Admitting: *Deleted

## 2016-07-18 NOTE — Telephone Encounter (Signed)
Please call pt to get her in sooner

## 2016-07-18 NOTE — Telephone Encounter (Signed)
patient's sister called, she is experiencing same symptoms she had before. severe confusion and rambling on, long detail stories, but not making a lot of sense.  patient told her today over the phone that  she do not have any medications from Dr.  Harrington Challenger.  I don't see a release to speak with her sister.

## 2016-07-19 DIAGNOSIS — J449 Chronic obstructive pulmonary disease, unspecified: Secondary | ICD-10-CM | POA: Diagnosis not present

## 2016-07-21 DIAGNOSIS — Z880 Allergy status to penicillin: Secondary | ICD-10-CM | POA: Diagnosis not present

## 2016-07-21 DIAGNOSIS — K219 Gastro-esophageal reflux disease without esophagitis: Secondary | ICD-10-CM | POA: Diagnosis not present

## 2016-07-21 DIAGNOSIS — R410 Disorientation, unspecified: Secondary | ICD-10-CM | POA: Diagnosis not present

## 2016-07-21 DIAGNOSIS — M549 Dorsalgia, unspecified: Secondary | ICD-10-CM | POA: Diagnosis not present

## 2016-07-21 DIAGNOSIS — M797 Fibromyalgia: Secondary | ICD-10-CM | POA: Diagnosis not present

## 2016-07-21 DIAGNOSIS — I110 Hypertensive heart disease with heart failure: Secondary | ICD-10-CM | POA: Diagnosis not present

## 2016-07-21 DIAGNOSIS — K5900 Constipation, unspecified: Secondary | ICD-10-CM | POA: Diagnosis not present

## 2016-07-21 DIAGNOSIS — Z882 Allergy status to sulfonamides status: Secondary | ICD-10-CM | POA: Diagnosis not present

## 2016-07-21 DIAGNOSIS — R1319 Other dysphagia: Secondary | ICD-10-CM | POA: Diagnosis not present

## 2016-07-21 DIAGNOSIS — G4700 Insomnia, unspecified: Secondary | ICD-10-CM | POA: Diagnosis not present

## 2016-07-21 DIAGNOSIS — R197 Diarrhea, unspecified: Secondary | ICD-10-CM | POA: Diagnosis not present

## 2016-07-21 DIAGNOSIS — R079 Chest pain, unspecified: Secondary | ICD-10-CM | POA: Diagnosis not present

## 2016-07-21 DIAGNOSIS — G9340 Encephalopathy, unspecified: Secondary | ICD-10-CM | POA: Diagnosis not present

## 2016-07-21 DIAGNOSIS — E785 Hyperlipidemia, unspecified: Secondary | ICD-10-CM | POA: Diagnosis not present

## 2016-07-21 DIAGNOSIS — G8929 Other chronic pain: Secondary | ICD-10-CM | POA: Diagnosis not present

## 2016-07-21 DIAGNOSIS — R9431 Abnormal electrocardiogram [ECG] [EKG]: Secondary | ICD-10-CM | POA: Diagnosis not present

## 2016-07-21 DIAGNOSIS — E876 Hypokalemia: Secondary | ICD-10-CM | POA: Diagnosis not present

## 2016-07-21 DIAGNOSIS — Z112 Encounter for screening for other bacterial diseases: Secondary | ICD-10-CM | POA: Diagnosis not present

## 2016-07-21 DIAGNOSIS — I5030 Unspecified diastolic (congestive) heart failure: Secondary | ICD-10-CM | POA: Diagnosis not present

## 2016-07-21 DIAGNOSIS — I10 Essential (primary) hypertension: Secondary | ICD-10-CM | POA: Diagnosis not present

## 2016-07-21 DIAGNOSIS — Z79899 Other long term (current) drug therapy: Secondary | ICD-10-CM | POA: Diagnosis not present

## 2016-07-22 DIAGNOSIS — I5030 Unspecified diastolic (congestive) heart failure: Secondary | ICD-10-CM | POA: Insufficient documentation

## 2016-07-22 DIAGNOSIS — F13232 Sedative, hypnotic or anxiolytic dependence with withdrawal with perceptual disturbance: Secondary | ICD-10-CM | POA: Insufficient documentation

## 2016-07-22 DIAGNOSIS — F13932 Sedative, hypnotic or anxiolytic use, unspecified with withdrawal with perceptual disturbances: Secondary | ICD-10-CM | POA: Insufficient documentation

## 2016-07-22 DIAGNOSIS — R410 Disorientation, unspecified: Secondary | ICD-10-CM | POA: Diagnosis not present

## 2016-07-22 DIAGNOSIS — G9340 Encephalopathy, unspecified: Secondary | ICD-10-CM | POA: Diagnosis not present

## 2016-07-22 DIAGNOSIS — E876 Hypokalemia: Secondary | ICD-10-CM | POA: Diagnosis not present

## 2016-07-22 NOTE — Telephone Encounter (Signed)
lmtcb

## 2016-07-23 ENCOUNTER — Ambulatory Visit: Payer: Medicare Other | Admitting: Internal Medicine

## 2016-07-23 DIAGNOSIS — G9340 Encephalopathy, unspecified: Secondary | ICD-10-CM | POA: Diagnosis not present

## 2016-07-23 DIAGNOSIS — R197 Diarrhea, unspecified: Secondary | ICD-10-CM | POA: Diagnosis not present

## 2016-07-23 DIAGNOSIS — I5030 Unspecified diastolic (congestive) heart failure: Secondary | ICD-10-CM | POA: Diagnosis not present

## 2016-07-23 DIAGNOSIS — E876 Hypokalemia: Secondary | ICD-10-CM | POA: Diagnosis not present

## 2016-07-24 DIAGNOSIS — G9340 Encephalopathy, unspecified: Secondary | ICD-10-CM | POA: Diagnosis not present

## 2016-07-24 DIAGNOSIS — I5030 Unspecified diastolic (congestive) heart failure: Secondary | ICD-10-CM | POA: Diagnosis not present

## 2016-07-24 DIAGNOSIS — E876 Hypokalemia: Secondary | ICD-10-CM | POA: Diagnosis not present

## 2016-07-24 DIAGNOSIS — Z0000 Encounter for general adult medical examination without abnormal findings: Secondary | ICD-10-CM | POA: Diagnosis not present

## 2016-07-24 DIAGNOSIS — R410 Disorientation, unspecified: Secondary | ICD-10-CM | POA: Diagnosis not present

## 2016-07-24 DIAGNOSIS — Z79899 Other long term (current) drug therapy: Secondary | ICD-10-CM | POA: Diagnosis not present

## 2016-07-24 DIAGNOSIS — R197 Diarrhea, unspecified: Secondary | ICD-10-CM | POA: Diagnosis not present

## 2016-07-24 NOTE — Telephone Encounter (Signed)
Called mobile number on file and it was a business number and the person that picked up stated it was the wrong number when office asked to spoke with pt.

## 2016-07-24 NOTE — Telephone Encounter (Signed)
Called pt home number and lmtcb due to trying to sch sooner appt. Office number provided on voicemail.

## 2016-07-25 DIAGNOSIS — R197 Diarrhea, unspecified: Secondary | ICD-10-CM | POA: Diagnosis not present

## 2016-07-25 DIAGNOSIS — R410 Disorientation, unspecified: Secondary | ICD-10-CM | POA: Diagnosis not present

## 2016-07-25 DIAGNOSIS — I5030 Unspecified diastolic (congestive) heart failure: Secondary | ICD-10-CM | POA: Diagnosis not present

## 2016-07-25 DIAGNOSIS — E876 Hypokalemia: Secondary | ICD-10-CM | POA: Diagnosis not present

## 2016-07-25 DIAGNOSIS — G9340 Encephalopathy, unspecified: Secondary | ICD-10-CM | POA: Diagnosis not present

## 2016-07-26 ENCOUNTER — Telehealth (HOSPITAL_COMMUNITY): Payer: Self-pay | Admitting: *Deleted

## 2016-07-26 DIAGNOSIS — Z0000 Encounter for general adult medical examination without abnormal findings: Secondary | ICD-10-CM | POA: Diagnosis not present

## 2016-07-26 DIAGNOSIS — R410 Disorientation, unspecified: Secondary | ICD-10-CM | POA: Diagnosis not present

## 2016-07-26 DIAGNOSIS — R197 Diarrhea, unspecified: Secondary | ICD-10-CM | POA: Diagnosis not present

## 2016-07-26 DIAGNOSIS — G9340 Encephalopathy, unspecified: Secondary | ICD-10-CM | POA: Diagnosis not present

## 2016-07-26 DIAGNOSIS — I5030 Unspecified diastolic (congestive) heart failure: Secondary | ICD-10-CM | POA: Diagnosis not present

## 2016-07-26 NOTE — Telephone Encounter (Signed)
Pharmacy requesting refills for pt Clonazepam. Per previous calls, tried calling pt to sch sooner appt dut unable to reach pt. Called pt pharmacy to ask them if they hear from pt to have her call office. Per Lanny Hurst the pharmacist to disregard the request due to pt sister calling them to inform them that pt is at [facility].

## 2016-07-27 DIAGNOSIS — R410 Disorientation, unspecified: Secondary | ICD-10-CM | POA: Diagnosis not present

## 2016-07-27 DIAGNOSIS — R197 Diarrhea, unspecified: Secondary | ICD-10-CM | POA: Diagnosis not present

## 2016-07-28 DIAGNOSIS — R410 Disorientation, unspecified: Secondary | ICD-10-CM | POA: Diagnosis not present

## 2016-07-28 DIAGNOSIS — R197 Diarrhea, unspecified: Secondary | ICD-10-CM | POA: Diagnosis not present

## 2016-07-28 DIAGNOSIS — R079 Chest pain, unspecified: Secondary | ICD-10-CM | POA: Diagnosis not present

## 2016-07-29 DIAGNOSIS — R410 Disorientation, unspecified: Secondary | ICD-10-CM | POA: Diagnosis not present

## 2016-07-29 DIAGNOSIS — R197 Diarrhea, unspecified: Secondary | ICD-10-CM | POA: Diagnosis not present

## 2016-07-29 DIAGNOSIS — E876 Hypokalemia: Secondary | ICD-10-CM | POA: Diagnosis not present

## 2016-07-29 DIAGNOSIS — R1312 Dysphagia, oropharyngeal phase: Secondary | ICD-10-CM | POA: Diagnosis not present

## 2016-07-30 DIAGNOSIS — K5900 Constipation, unspecified: Secondary | ICD-10-CM | POA: Diagnosis not present

## 2016-07-30 DIAGNOSIS — R489 Unspecified symbolic dysfunctions: Secondary | ICD-10-CM | POA: Insufficient documentation

## 2016-07-30 DIAGNOSIS — F0390 Unspecified dementia without behavioral disturbance: Secondary | ICD-10-CM | POA: Insufficient documentation

## 2016-07-30 DIAGNOSIS — Z882 Allergy status to sulfonamides status: Secondary | ICD-10-CM | POA: Diagnosis not present

## 2016-07-30 DIAGNOSIS — Z79899 Other long term (current) drug therapy: Secondary | ICD-10-CM | POA: Diagnosis not present

## 2016-07-30 DIAGNOSIS — I509 Heart failure, unspecified: Secondary | ICD-10-CM | POA: Diagnosis not present

## 2016-07-30 DIAGNOSIS — M797 Fibromyalgia: Secondary | ICD-10-CM | POA: Diagnosis not present

## 2016-07-30 DIAGNOSIS — R079 Chest pain, unspecified: Secondary | ICD-10-CM | POA: Diagnosis not present

## 2016-07-30 DIAGNOSIS — R531 Weakness: Secondary | ICD-10-CM | POA: Diagnosis not present

## 2016-07-30 DIAGNOSIS — Z112 Encounter for screening for other bacterial diseases: Secondary | ICD-10-CM | POA: Diagnosis not present

## 2016-07-30 DIAGNOSIS — G8929 Other chronic pain: Secondary | ICD-10-CM | POA: Diagnosis not present

## 2016-07-30 DIAGNOSIS — J449 Chronic obstructive pulmonary disease, unspecified: Secondary | ICD-10-CM | POA: Diagnosis not present

## 2016-07-30 DIAGNOSIS — R9431 Abnormal electrocardiogram [ECG] [EKG]: Secondary | ICD-10-CM | POA: Diagnosis not present

## 2016-07-30 DIAGNOSIS — I110 Hypertensive heart disease with heart failure: Secondary | ICD-10-CM | POA: Diagnosis not present

## 2016-07-30 DIAGNOSIS — G3184 Mild cognitive impairment, so stated: Secondary | ICD-10-CM | POA: Diagnosis not present

## 2016-07-30 DIAGNOSIS — M549 Dorsalgia, unspecified: Secondary | ICD-10-CM | POA: Diagnosis not present

## 2016-07-30 DIAGNOSIS — E785 Hyperlipidemia, unspecified: Secondary | ICD-10-CM | POA: Diagnosis not present

## 2016-07-30 DIAGNOSIS — R410 Disorientation, unspecified: Secondary | ICD-10-CM | POA: Diagnosis not present

## 2016-07-30 DIAGNOSIS — E876 Hypokalemia: Secondary | ICD-10-CM | POA: Diagnosis not present

## 2016-07-30 DIAGNOSIS — K219 Gastro-esophageal reflux disease without esophagitis: Secondary | ICD-10-CM | POA: Diagnosis not present

## 2016-07-30 DIAGNOSIS — F331 Major depressive disorder, recurrent, moderate: Secondary | ICD-10-CM | POA: Insufficient documentation

## 2016-07-30 DIAGNOSIS — E213 Hyperparathyroidism, unspecified: Secondary | ICD-10-CM | POA: Diagnosis not present

## 2016-07-30 DIAGNOSIS — R197 Diarrhea, unspecified: Secondary | ICD-10-CM | POA: Diagnosis not present

## 2016-07-30 DIAGNOSIS — G4700 Insomnia, unspecified: Secondary | ICD-10-CM | POA: Diagnosis not present

## 2016-07-30 DIAGNOSIS — Z880 Allergy status to penicillin: Secondary | ICD-10-CM | POA: Diagnosis not present

## 2016-07-30 DIAGNOSIS — M6281 Muscle weakness (generalized): Secondary | ICD-10-CM | POA: Diagnosis not present

## 2016-07-30 DIAGNOSIS — R1319 Other dysphagia: Secondary | ICD-10-CM | POA: Diagnosis not present

## 2016-07-30 DIAGNOSIS — I5030 Unspecified diastolic (congestive) heart failure: Secondary | ICD-10-CM | POA: Diagnosis not present

## 2016-07-30 DIAGNOSIS — R488 Other symbolic dysfunctions: Secondary | ICD-10-CM | POA: Diagnosis not present

## 2016-07-31 DIAGNOSIS — I110 Hypertensive heart disease with heart failure: Secondary | ICD-10-CM | POA: Diagnosis not present

## 2016-07-31 DIAGNOSIS — I5030 Unspecified diastolic (congestive) heart failure: Secondary | ICD-10-CM | POA: Diagnosis not present

## 2016-07-31 DIAGNOSIS — M6281 Muscle weakness (generalized): Secondary | ICD-10-CM | POA: Insufficient documentation

## 2016-07-31 DIAGNOSIS — E876 Hypokalemia: Secondary | ICD-10-CM | POA: Diagnosis not present

## 2016-08-07 DIAGNOSIS — G3184 Mild cognitive impairment, so stated: Secondary | ICD-10-CM | POA: Diagnosis not present

## 2016-08-07 DIAGNOSIS — F29 Unspecified psychosis not due to a substance or known physiological condition: Secondary | ICD-10-CM | POA: Insufficient documentation

## 2016-08-09 DIAGNOSIS — G3184 Mild cognitive impairment, so stated: Secondary | ICD-10-CM | POA: Diagnosis not present

## 2016-08-14 DIAGNOSIS — G3184 Mild cognitive impairment, so stated: Secondary | ICD-10-CM | POA: Diagnosis not present

## 2016-08-21 DIAGNOSIS — E876 Hypokalemia: Secondary | ICD-10-CM | POA: Diagnosis not present

## 2016-08-21 DIAGNOSIS — G3184 Mild cognitive impairment, so stated: Secondary | ICD-10-CM | POA: Diagnosis not present

## 2016-08-21 DIAGNOSIS — I5030 Unspecified diastolic (congestive) heart failure: Secondary | ICD-10-CM | POA: Diagnosis not present

## 2016-08-27 DIAGNOSIS — G3184 Mild cognitive impairment, so stated: Secondary | ICD-10-CM | POA: Diagnosis not present

## 2016-08-27 DIAGNOSIS — R488 Other symbolic dysfunctions: Secondary | ICD-10-CM | POA: Diagnosis not present

## 2016-08-27 DIAGNOSIS — M6281 Muscle weakness (generalized): Secondary | ICD-10-CM | POA: Diagnosis not present

## 2016-08-29 DIAGNOSIS — G3184 Mild cognitive impairment, so stated: Secondary | ICD-10-CM | POA: Diagnosis not present

## 2016-08-29 DIAGNOSIS — R488 Other symbolic dysfunctions: Secondary | ICD-10-CM | POA: Diagnosis not present

## 2016-08-29 DIAGNOSIS — M6281 Muscle weakness (generalized): Secondary | ICD-10-CM | POA: Diagnosis not present

## 2016-09-03 DIAGNOSIS — M6281 Muscle weakness (generalized): Secondary | ICD-10-CM | POA: Diagnosis not present

## 2016-09-03 DIAGNOSIS — R488 Other symbolic dysfunctions: Secondary | ICD-10-CM | POA: Diagnosis not present

## 2016-09-03 DIAGNOSIS — G3184 Mild cognitive impairment, so stated: Secondary | ICD-10-CM | POA: Diagnosis not present

## 2016-09-05 DIAGNOSIS — M6281 Muscle weakness (generalized): Secondary | ICD-10-CM | POA: Diagnosis not present

## 2016-09-05 DIAGNOSIS — R488 Other symbolic dysfunctions: Secondary | ICD-10-CM | POA: Diagnosis not present

## 2016-09-05 DIAGNOSIS — G3184 Mild cognitive impairment, so stated: Secondary | ICD-10-CM | POA: Diagnosis not present

## 2016-09-10 DIAGNOSIS — G3184 Mild cognitive impairment, so stated: Secondary | ICD-10-CM | POA: Diagnosis not present

## 2016-09-10 DIAGNOSIS — R488 Other symbolic dysfunctions: Secondary | ICD-10-CM | POA: Diagnosis not present

## 2016-09-10 DIAGNOSIS — M6281 Muscle weakness (generalized): Secondary | ICD-10-CM | POA: Diagnosis not present

## 2016-09-11 DIAGNOSIS — R29818 Other symptoms and signs involving the nervous system: Secondary | ICD-10-CM | POA: Insufficient documentation

## 2016-09-11 DIAGNOSIS — R4189 Other symptoms and signs involving cognitive functions and awareness: Secondary | ICD-10-CM | POA: Insufficient documentation

## 2016-09-11 DIAGNOSIS — F323 Major depressive disorder, single episode, severe with psychotic features: Secondary | ICD-10-CM | POA: Insufficient documentation

## 2016-09-12 DIAGNOSIS — G3184 Mild cognitive impairment, so stated: Secondary | ICD-10-CM | POA: Diagnosis not present

## 2016-09-12 DIAGNOSIS — M6281 Muscle weakness (generalized): Secondary | ICD-10-CM | POA: Diagnosis not present

## 2016-09-12 DIAGNOSIS — R488 Other symbolic dysfunctions: Secondary | ICD-10-CM | POA: Diagnosis not present

## 2016-09-16 DIAGNOSIS — J449 Chronic obstructive pulmonary disease, unspecified: Secondary | ICD-10-CM | POA: Diagnosis not present

## 2016-09-17 DIAGNOSIS — M6281 Muscle weakness (generalized): Secondary | ICD-10-CM | POA: Diagnosis not present

## 2016-09-17 DIAGNOSIS — R488 Other symbolic dysfunctions: Secondary | ICD-10-CM | POA: Diagnosis not present

## 2016-09-17 DIAGNOSIS — G3184 Mild cognitive impairment, so stated: Secondary | ICD-10-CM | POA: Diagnosis not present

## 2016-09-23 DIAGNOSIS — Z79891 Long term (current) use of opiate analgesic: Secondary | ICD-10-CM | POA: Diagnosis not present

## 2016-09-23 DIAGNOSIS — M25569 Pain in unspecified knee: Secondary | ICD-10-CM | POA: Diagnosis not present

## 2016-09-23 DIAGNOSIS — M545 Low back pain: Secondary | ICD-10-CM | POA: Diagnosis not present

## 2016-09-23 DIAGNOSIS — M130 Polyarthritis, unspecified: Secondary | ICD-10-CM | POA: Diagnosis not present

## 2016-09-23 DIAGNOSIS — I10 Essential (primary) hypertension: Secondary | ICD-10-CM | POA: Diagnosis not present

## 2016-09-23 DIAGNOSIS — G894 Chronic pain syndrome: Secondary | ICD-10-CM | POA: Diagnosis not present

## 2016-09-23 DIAGNOSIS — M797 Fibromyalgia: Secondary | ICD-10-CM | POA: Diagnosis not present

## 2016-09-23 DIAGNOSIS — R410 Disorientation, unspecified: Secondary | ICD-10-CM | POA: Diagnosis not present

## 2016-09-23 DIAGNOSIS — Z79899 Other long term (current) drug therapy: Secondary | ICD-10-CM | POA: Diagnosis not present

## 2016-09-23 DIAGNOSIS — G43719 Chronic migraine without aura, intractable, without status migrainosus: Secondary | ICD-10-CM | POA: Diagnosis not present

## 2016-09-23 DIAGNOSIS — M542 Cervicalgia: Secondary | ICD-10-CM | POA: Diagnosis not present

## 2016-09-30 DIAGNOSIS — Z79899 Other long term (current) drug therapy: Secondary | ICD-10-CM | POA: Diagnosis not present

## 2016-09-30 DIAGNOSIS — E209 Hypoparathyroidism, unspecified: Secondary | ICD-10-CM | POA: Diagnosis not present

## 2016-09-30 DIAGNOSIS — E7889 Other lipoprotein metabolism disorders: Secondary | ICD-10-CM | POA: Diagnosis not present

## 2016-09-30 DIAGNOSIS — I10 Essential (primary) hypertension: Secondary | ICD-10-CM | POA: Diagnosis not present

## 2016-09-30 DIAGNOSIS — Z1159 Encounter for screening for other viral diseases: Secondary | ICD-10-CM | POA: Diagnosis not present

## 2016-10-06 DIAGNOSIS — L299 Pruritus, unspecified: Secondary | ICD-10-CM | POA: Insufficient documentation

## 2016-10-07 DIAGNOSIS — E213 Hyperparathyroidism, unspecified: Secondary | ICD-10-CM | POA: Diagnosis not present

## 2016-10-07 DIAGNOSIS — I10 Essential (primary) hypertension: Secondary | ICD-10-CM | POA: Diagnosis not present

## 2016-10-07 DIAGNOSIS — K219 Gastro-esophageal reflux disease without esophagitis: Secondary | ICD-10-CM | POA: Diagnosis not present

## 2016-10-07 DIAGNOSIS — I5030 Unspecified diastolic (congestive) heart failure: Secondary | ICD-10-CM | POA: Diagnosis not present

## 2016-10-08 ENCOUNTER — Other Ambulatory Visit (HOSPITAL_COMMUNITY): Payer: Self-pay | Admitting: Geriatric Medicine

## 2016-10-08 DIAGNOSIS — M810 Age-related osteoporosis without current pathological fracture: Secondary | ICD-10-CM

## 2016-10-10 DIAGNOSIS — R262 Difficulty in walking, not elsewhere classified: Secondary | ICD-10-CM | POA: Diagnosis not present

## 2016-10-10 DIAGNOSIS — I10 Essential (primary) hypertension: Secondary | ICD-10-CM | POA: Diagnosis not present

## 2016-10-10 DIAGNOSIS — R29818 Other symptoms and signs involving the nervous system: Secondary | ICD-10-CM | POA: Diagnosis not present

## 2016-10-10 DIAGNOSIS — Z9181 History of falling: Secondary | ICD-10-CM | POA: Diagnosis not present

## 2016-10-11 DIAGNOSIS — Z9181 History of falling: Secondary | ICD-10-CM | POA: Diagnosis not present

## 2016-10-11 DIAGNOSIS — R262 Difficulty in walking, not elsewhere classified: Secondary | ICD-10-CM | POA: Diagnosis not present

## 2016-10-11 DIAGNOSIS — R29818 Other symptoms and signs involving the nervous system: Secondary | ICD-10-CM | POA: Diagnosis not present

## 2016-10-11 DIAGNOSIS — I10 Essential (primary) hypertension: Secondary | ICD-10-CM | POA: Diagnosis not present

## 2016-10-12 ENCOUNTER — Emergency Department (HOSPITAL_COMMUNITY): Payer: Medicare Other

## 2016-10-12 ENCOUNTER — Emergency Department (HOSPITAL_COMMUNITY)
Admission: EM | Admit: 2016-10-12 | Discharge: 2016-10-12 | Disposition: A | Discharge status: Home / Self Care | Payer: Medicare Other | Attending: Emergency Medicine | Admitting: Emergency Medicine

## 2016-10-12 ENCOUNTER — Encounter (HOSPITAL_COMMUNITY): Payer: Self-pay | Admitting: Emergency Medicine

## 2016-10-12 DIAGNOSIS — J45909 Unspecified asthma, uncomplicated: Secondary | ICD-10-CM | POA: Insufficient documentation

## 2016-10-12 DIAGNOSIS — S6991XA Unspecified injury of right wrist, hand and finger(s), initial encounter: Secondary | ICD-10-CM | POA: Diagnosis not present

## 2016-10-12 DIAGNOSIS — I10 Essential (primary) hypertension: Secondary | ICD-10-CM | POA: Insufficient documentation

## 2016-10-12 DIAGNOSIS — Y9389 Activity, other specified: Secondary | ICD-10-CM | POA: Diagnosis not present

## 2016-10-12 DIAGNOSIS — W1839XA Other fall on same level, initial encounter: Secondary | ICD-10-CM | POA: Diagnosis not present

## 2016-10-12 DIAGNOSIS — Z79899 Other long term (current) drug therapy: Secondary | ICD-10-CM | POA: Insufficient documentation

## 2016-10-12 DIAGNOSIS — Y929 Unspecified place or not applicable: Secondary | ICD-10-CM | POA: Insufficient documentation

## 2016-10-12 DIAGNOSIS — M25531 Pain in right wrist: Secondary | ICD-10-CM | POA: Diagnosis not present

## 2016-10-12 DIAGNOSIS — Y999 Unspecified external cause status: Secondary | ICD-10-CM | POA: Diagnosis not present

## 2016-10-12 MED ORDER — HYDROCODONE-ACETAMINOPHEN 5-325 MG PO TABS: ORAL_TABLET | ORAL | 0 refills | Status: DC

## 2016-10-12 MED ORDER — NAPROXEN 500 MG PO TABS: 500.0000 mg | ORAL_TABLET | Freq: Two times a day (BID) | ORAL | 0 refills | Status: DC

## 2016-10-12 NOTE — Discharge Instructions (Signed)
Elevate and apply ice packs on and off to your hand.  Wear the brace as needed for one week. Call Dr. Ruthe Mannan office to arrange a follow-up appointment in one week if not improving.

## 2016-10-12 NOTE — ED Triage Notes (Signed)
Pt reports falling yesterday and catching self with right hand.  Bruising and swelling noted.  Denies other injury.

## 2016-10-15 DIAGNOSIS — R29818 Other symptoms and signs involving the nervous system: Secondary | ICD-10-CM | POA: Diagnosis not present

## 2016-10-15 DIAGNOSIS — I10 Essential (primary) hypertension: Secondary | ICD-10-CM | POA: Diagnosis not present

## 2016-10-15 DIAGNOSIS — Z9181 History of falling: Secondary | ICD-10-CM | POA: Diagnosis not present

## 2016-10-15 DIAGNOSIS — R262 Difficulty in walking, not elsewhere classified: Secondary | ICD-10-CM | POA: Diagnosis not present

## 2016-10-15 NOTE — ED Provider Notes (Signed)
[facility] DEPT Provider Note   CSN: [id number] Arrival date & time: 10/12/16  1049     History   Chief Complaint Chief Complaint  Patient presents with  . Hand Injury    HPI EMMORY Houston is a 71 y.o. female.  HPI  [name] is a 71 y.o. female who presents to the Emergency Department complaining of pain and swelling to the dorsal right hand.  She reports a mechanical fall onto her right hand.  She denies other injuries, but reports pain to her hand with gripping and movement.  Pain radiates to wrist.  She denies numbness, elbow or forearm pain.     Past Medical History:  Diagnosis Date  . Adenomatous colon polyp 2006   excised in 2006 & 2010Due surveillance 06/2013  . Anxiety   . Anxiety and depression   . Asthma   . Breast tumor   . Chest discomfort   . Chronic back pain   . Chronic constipation   . Complication of anesthesia   . Depression   . Diverticulosis   . Fibromyalgia   . Gastroesophageal reflux disease   . Hiatal hernia   . History of benign esophageal tumor 2010   granular cell esophageal tumor (Dx 06/2008), resected via EMR 2011, due repeat EGD 02/2012  . Hyperlipidemia   . Hypertension   . Insomnia   . Migraines   . PONV (postoperative nausea and vomiting)   . Psychosis   . Sleep apnea    Stop Bang score of 5. Pt said she was told by Dr. Merlene Laughter that she had "a little bit" of sleep apena, but not bad enough to treat.  . Thyroid nodule   . Tumor of esophagus     Patient Active Problem List   Diagnosis Date Noted  . Urinary frequency 11/07/2015  . Abdominal pain 07/26/2015  . Dysphagia   . Schatzki's ring   . Hiatal hernia   . Knee contusion 05/02/2014  . Acquired trigger finger 10/12/2013  . Constipation 07/15/2013  . Abdominal pain, epigastric 07/15/2013  . LLQ pain 07/15/2013  . Lightheaded 01/21/2013  . Elevated LFTs 12/26/2012  . History of benign esophageal tumor   . Adenomatous colon polyp   . [place]  DISEASE, LUMBOSACRAL SPINE 05/01/2010  . HYPERLIPIDEMIA 08/22/2009  . THYROID NODULE 08/16/2009  . Anxiety and depression 08/16/2009  . HYPERTENSION 08/16/2009  . Gastroesophageal reflux disease 08/16/2009  . FIBROMYALGIA 08/16/2009    Past Surgical History:  Procedure Laterality Date  . ABDOMINAL HYSTERECTOMY    . BRAVO [place] STUDY  12/11/2011   Procedure: BRAVO [place];  Surgeon: Daneil Dolin, MD;  Location: [facility];  Service: Endoscopy;  Laterality: N/A;  . 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 CHOLECYSTECTOMY N/A 04/09/2013   Procedure: LAPAROSCOPIC CHOLECYSTECTOMY;  Surgeon: Jamesetta So, MD;  Location: [facility] ORS;  Service: General;  Laterality: N/A;  . COLONOSCOPY  06/2008, 06/2011   sigmoid tics, tubular adenoma; 2013: anal canal hemorrhoids  . COLONOSCOPY  07/10/2011   Anal canal hemorrhoids likely the cause of hematochezia in the setting of constipation; otherwise normal rectum ;submucosal  petechiae in left colon of doubtful clinical significance; otherwise, normal colon  . ESOPHAGEAL DILATION N/A 05/08/2015   Procedure: ESOPHAGEAL DILATION;  Surgeon: Daneil Dolin, MD;  Location: [facility] ORS;  Service: Endoscopy;  Laterality: N/AVenia Minks 54/56  . ESOPHAGOGASTRODUODENOSCOPY  12/11/2011   [id number] Schatzki's ring; otherwise  normal/Small hiatal hernia. Antral and bulbar erosions  . ESOPHAGOGASTRODUODENOSCOPY (EGD) WITH PROPOFOL N/A 05/08/2015   Dr. Gala Romney: mild erosive reflux esophagitis, non-critical Schatzki's ring s/p dilation. Hiatal hernia.   . EUS  08/2010   Dr [facility] with EGD. Retained food. No recurrent esophageal lesion, bx negative.  Marland Kitchen LUNG BIOPSY     negative  . PARATHYROIDECTOMY  April 2017   [facility].   Marland Kitchen RIGHT OOPHORECTOMY     benign disease  . SHOULDER ARTHROSCOPY     Right; bone spurs removed  . TONSILLECTOMY    . TOTAL KNEE ARTHROPLASTY  2002   Right; previous arthroscopic surgery    OB History     No data available       Home Medications    Prior to Admission medications   Medication Sig Start Date End Date Taking? Authorizing Provider  acetaminophen (TYLENOL) 500 MG tablet Take 500 mg by mouth every 8 (eight) hours as needed for mild pain or moderate pain.    Historical Provider, MD  albuterol (PROVENTIL HFA;VENTOLIN HFA) 108 (90 BASE) MCG/ACT inhaler Inhale 2 puffs into the lungs every 6 (six) hours as needed. For shortness of breath    Historical Provider, MD  amLODipine (NORVASC) 5 MG tablet Take 10 mg by mouth daily.  12/18/15   Historical Provider, MD  atenolol (TENORMIN) 50 MG tablet Take 50 mg by mouth 2 (two) times daily. Taking when not on Hydralazine HCL    Historical Provider, MD  azithromycin (ZITHROMAX) 250 MG tablet Take by mouth daily.    Historical Provider, MD  CALCIUM-VITAMIN D PO Take by mouth daily.    Historical Provider, MD  cholecalciferol (VITAMIN D) 1000 units tablet Take 1,000 Units by mouth daily. Reported on 10/24/2015    Historical Provider, MD  clonazePAM (KLONOPIN) 1 MG tablet Take 1 tablet (1 mg total) by mouth 3 (three) times daily. 03/12/16 03/12/17  Cloria Spring, MD  fluticasone (FLONASE) 50 MCG/ACT nasal spray Place 1 spray into both nostrils 2 (two) times daily.    Historical Provider, MD  furosemide (LASIX) 40 MG tablet TAKE 1 TABLET BY MOUTH DAILY AS NEEDED FOR SWELLING 03/21/16   Lendon Colonel, NP  hydrALAZINE (APRESOLINE) 50 MG tablet Take 100 mg by mouth daily. Taking when not on Atenolol    Historical Provider, MD  HYDROcodone-acetaminophen (NORCO/VICODIN) 5-325 MG tablet Take one tab po q 4-6 hrs prn pain 10/12/16   Patt Steinhardt, PA-C  ibuprofen (ADVIL,MOTRIN) 800 MG tablet Take 800 mg by mouth 3 (three) times daily as needed.    Historical Provider, MD  loratadine (CLARITIN) 10 MG tablet Take 10 mg by mouth daily. Reported on 12/19/2015    Historical Provider, MD  losartan (COZAAR) 25 MG tablet Take 25 mg by mouth daily.    Historical  Provider, MD  methylPREDNISolone (MEDROL DOSEPAK) 4 MG TBPK tablet Take by mouth.    Historical Provider, MD  MYRBETRIQ 25 MG TB24 tablet Take 1 tablet by mouth daily. 05/07/16   Historical Provider, MD  naproxen (NAPROSYN) 500 MG tablet Take 1 tablet (500 mg total) by mouth 2 (two) times daily with a meal. 10/12/16   Cleo Santucci, PA-C  pantoprazole (PROTONIX) 40 MG tablet Take 1 tablet (40 mg total) by mouth daily. 05/14/16   Daneil Dolin, MD  potassium chloride SA (K-DUR,KLOR-CON) 20 MEQ tablet Take by mouth. 01/24/15   Historical Provider, MD  QUEtiapine (SEROQUEL) 100 MG tablet Take 1 tablet (100 mg total) by mouth at  bedtime. 04/22/16 04/22/17  Cloria Spring, MD  spironolactone (ALDACTONE) 25 MG tablet Take 25 mg by mouth daily. 12/18/15   Historical Provider, MD  topiramate (TOPAMAX) 50 MG tablet Take 100 mg by mouth daily.    Historical Provider, MD  venlafaxine XR (EFFEXOR XR) 75 MG 24 hr capsule Take 1 capsule (75 mg total) by mouth daily with breakfast. 04/22/16 04/22/17  Cloria Spring, MD  Vitamin D, Ergocalciferol, (DRISDOL) 50000 units CAPS capsule Take 50,000 Units by mouth every 7 (seven) days.    Historical Provider, MD    Family History Family History  Problem Relation Age of Onset  . Stroke Father   . Alcohol abuse Father   . Heart attack Mother   . Depression Mother   . Anxiety disorder Mother   . Colon cancer Paternal Aunt   . Colon cancer Paternal Uncle   . Dementia Maternal Uncle   . ADD / ADHD Neg Hx   . Bipolar disorder Neg Hx   . Drug abuse Neg Hx   . OCD Neg Hx   . Paranoid behavior Neg Hx   . Schizophrenia Neg Hx   . Seizures Neg Hx   . Sexual abuse Neg Hx   . Physical abuse Neg Hx     Social History Social History  Substance Use Topics  . Smoking status: Never Smoker  . Smokeless tobacco: Never Used  . Alcohol use No     Allergies   Elavil [amitriptyline]; Abilify [aripiprazole]; Trazodone and nefazodone; Codeine; Latex; Penicillin v;  Penicillins; Polyethylene glycol; Sulfonamide derivatives; Cymbalta [duloxetine hcl]; and Remeron [mirtazapine]   Review of Systems Review of Systems  Constitutional: Negative for chills and fever.  Genitourinary: Negative for difficulty urinating and dysuria.  Musculoskeletal: Positive for arthralgias (right hand pain) and joint swelling.  Skin: Negative for color change and wound.  All other systems reviewed and are negative.    Physical Exam Updated Vital Signs BP (!) 143/71 (BP Location: Left Arm)   Pulse (!) 58   Temp 98.2 F (36.8 C) (Oral)   Resp 18   Ht 5\' 2"  (1.575 m)   Wt 91.6 kg   SpO2 100%   BMI 36.95 kg/m   Physical Exam  Constitutional: She is oriented to person, place, and time. She appears well-developed and well-nourished. No distress.  HENT:  Head: Normocephalic and atraumatic.  Neck: Normal range of motion. Neck supple.  Cardiovascular: Normal rate, regular rhythm and intact distal pulses.   Pulmonary/Chest: Effort normal and breath sounds normal.  Musculoskeletal: She exhibits tenderness. She exhibits no edema.   ttp and mild edema of the dorsal right hand.  No bruising or bony deformity.  Compartments soft.  Neurological: She is alert and oriented to person, place, and time. No sensory deficit. She exhibits normal muscle tone. Coordination normal.  Skin: Skin is warm and dry. Capillary refill takes less than 2 seconds.  Nursing note and vitals reviewed.    ED Treatments / Results  Labs (all labs ordered are listed, but only abnormal results are displayed) Labs Reviewed - No data to display  EKG  EKG Interpretation None       Radiology Dg Wrist Complete Right  Result Date: 10/12/2016 CLINICAL DATA:  Fall, right hand and wrist pain. EXAM: RIGHT WRIST - COMPLETE 3+ VIEW COMPARISON:  Plain film of the right hand dated 10/09/2015. FINDINGS: Old healed fracture deformities at the bases of the fourth and fifth metacarpal bones. Carpal bones appear  intact and  normally aligned throughout. Distal radius and ulna appear intact and normally aligned. Adjacent soft tissues are unremarkable. IMPRESSION: No acute findings. Old healed fracture deformities at the bases of the fourth and fifth metacarpal bones. Electronically Signed   By: Franki Cabot M.D.   On: 10/12/2016 11:40   Dg Hand Complete Right  Result Date: 10/12/2016 CLINICAL DATA:  Status post fall. EXAM: RIGHT HAND - COMPLETE 3+ VIEW COMPARISON:  Plain film of the right hand dated 10/09/2015. FINDINGS: Old fracture deformities at the bases of the fourth and fifth metacarpal bones. No acute appearing fracture line or displaced fracture fragment. IMPRESSION: No acute findings. Old fracture deformities at the bases of the fourth and fifth metacarpal bones. Electronically Signed   By: Franki Cabot M.D.   On: 10/12/2016 11:39     Procedures Procedures (including critical care time)  Medications Ordered in ED Medications - No data to display   Initial Impression / Assessment and Plan / ED Course  I have reviewed the triage vital signs and the nursing notes.  Pertinent labs & imaging results that were available during my care of the patient were reviewed by me and considered in my medical decision making (see chart for details).     XR neg for acute fx.  NV intact.    velcro forearm splint applied by nursing.  Pt agrees to RICE therapy and orthopedic f/u if not improving.   Final Clinical Impressions(s) / ED Diagnoses   Final diagnoses:  Injury of right hand, initial encounter    New Prescriptions Discharge Medication List as of 10/12/2016 12:05 PM    START taking these medications   Details  naproxen (NAPROSYN) 500 MG tablet Take 1 tablet (500 mg total) by mouth 2 (two) times daily with a meal., Starting Sat 10/12/2016, Print         Alberto Pina Jermyn, PA-C 10/15/16 2308    Margette Fast, MD 10/16/16 [phone]

## 2016-10-17 DIAGNOSIS — E041 Nontoxic single thyroid nodule: Secondary | ICD-10-CM | POA: Insufficient documentation

## 2016-10-17 DIAGNOSIS — J449 Chronic obstructive pulmonary disease, unspecified: Secondary | ICD-10-CM | POA: Diagnosis not present

## 2016-10-18 DIAGNOSIS — I10 Essential (primary) hypertension: Secondary | ICD-10-CM | POA: Diagnosis not present

## 2016-10-18 DIAGNOSIS — R262 Difficulty in walking, not elsewhere classified: Secondary | ICD-10-CM | POA: Diagnosis not present

## 2016-10-18 DIAGNOSIS — R29818 Other symptoms and signs involving the nervous system: Secondary | ICD-10-CM | POA: Diagnosis not present

## 2016-10-18 DIAGNOSIS — Z9181 History of falling: Secondary | ICD-10-CM | POA: Diagnosis not present

## 2016-10-19 DIAGNOSIS — Z9181 History of falling: Secondary | ICD-10-CM | POA: Diagnosis not present

## 2016-10-19 DIAGNOSIS — I10 Essential (primary) hypertension: Secondary | ICD-10-CM | POA: Diagnosis not present

## 2016-10-19 DIAGNOSIS — R29818 Other symptoms and signs involving the nervous system: Secondary | ICD-10-CM | POA: Diagnosis not present

## 2016-10-19 DIAGNOSIS — R262 Difficulty in walking, not elsewhere classified: Secondary | ICD-10-CM | POA: Diagnosis not present

## 2016-10-21 DIAGNOSIS — I10 Essential (primary) hypertension: Secondary | ICD-10-CM | POA: Diagnosis not present

## 2016-10-21 DIAGNOSIS — Z9181 History of falling: Secondary | ICD-10-CM | POA: Diagnosis not present

## 2016-10-21 DIAGNOSIS — R29818 Other symptoms and signs involving the nervous system: Secondary | ICD-10-CM | POA: Diagnosis not present

## 2016-10-21 DIAGNOSIS — R262 Difficulty in walking, not elsewhere classified: Secondary | ICD-10-CM | POA: Diagnosis not present

## 2016-10-23 DIAGNOSIS — E209 Hypoparathyroidism, unspecified: Secondary | ICD-10-CM | POA: Diagnosis not present

## 2016-10-23 DIAGNOSIS — R262 Difficulty in walking, not elsewhere classified: Secondary | ICD-10-CM | POA: Diagnosis not present

## 2016-10-23 DIAGNOSIS — Z9181 History of falling: Secondary | ICD-10-CM | POA: Diagnosis not present

## 2016-10-23 DIAGNOSIS — I10 Essential (primary) hypertension: Secondary | ICD-10-CM | POA: Diagnosis not present

## 2016-10-23 DIAGNOSIS — R29818 Other symptoms and signs involving the nervous system: Secondary | ICD-10-CM | POA: Diagnosis not present

## 2016-10-24 DIAGNOSIS — R262 Difficulty in walking, not elsewhere classified: Secondary | ICD-10-CM | POA: Diagnosis not present

## 2016-10-24 DIAGNOSIS — E209 Hypoparathyroidism, unspecified: Secondary | ICD-10-CM | POA: Diagnosis not present

## 2016-10-24 DIAGNOSIS — Z9181 History of falling: Secondary | ICD-10-CM | POA: Diagnosis not present

## 2016-10-24 DIAGNOSIS — R29818 Other symptoms and signs involving the nervous system: Secondary | ICD-10-CM | POA: Diagnosis not present

## 2016-10-24 DIAGNOSIS — I10 Essential (primary) hypertension: Secondary | ICD-10-CM | POA: Diagnosis not present

## 2016-10-25 DIAGNOSIS — Z9181 History of falling: Secondary | ICD-10-CM | POA: Diagnosis not present

## 2016-10-25 DIAGNOSIS — E209 Hypoparathyroidism, unspecified: Secondary | ICD-10-CM | POA: Diagnosis not present

## 2016-10-25 DIAGNOSIS — I10 Essential (primary) hypertension: Secondary | ICD-10-CM | POA: Diagnosis not present

## 2016-10-25 DIAGNOSIS — R29818 Other symptoms and signs involving the nervous system: Secondary | ICD-10-CM | POA: Diagnosis not present

## 2016-10-25 DIAGNOSIS — R262 Difficulty in walking, not elsewhere classified: Secondary | ICD-10-CM | POA: Diagnosis not present

## 2016-10-28 DIAGNOSIS — Z9181 History of falling: Secondary | ICD-10-CM | POA: Diagnosis not present

## 2016-10-28 DIAGNOSIS — E209 Hypoparathyroidism, unspecified: Secondary | ICD-10-CM | POA: Diagnosis not present

## 2016-10-28 DIAGNOSIS — I10 Essential (primary) hypertension: Secondary | ICD-10-CM | POA: Diagnosis not present

## 2016-10-28 DIAGNOSIS — R262 Difficulty in walking, not elsewhere classified: Secondary | ICD-10-CM | POA: Diagnosis not present

## 2016-10-28 DIAGNOSIS — R29818 Other symptoms and signs involving the nervous system: Secondary | ICD-10-CM | POA: Diagnosis not present

## 2016-10-31 DIAGNOSIS — R29818 Other symptoms and signs involving the nervous system: Secondary | ICD-10-CM | POA: Diagnosis not present

## 2016-10-31 DIAGNOSIS — I10 Essential (primary) hypertension: Secondary | ICD-10-CM | POA: Diagnosis not present

## 2016-10-31 DIAGNOSIS — Z9181 History of falling: Secondary | ICD-10-CM | POA: Diagnosis not present

## 2016-10-31 DIAGNOSIS — R262 Difficulty in walking, not elsewhere classified: Secondary | ICD-10-CM | POA: Diagnosis not present

## 2016-10-31 DIAGNOSIS — E209 Hypoparathyroidism, unspecified: Secondary | ICD-10-CM | POA: Diagnosis not present

## 2016-11-04 DIAGNOSIS — R29818 Other symptoms and signs involving the nervous system: Secondary | ICD-10-CM | POA: Diagnosis not present

## 2016-11-04 DIAGNOSIS — I10 Essential (primary) hypertension: Secondary | ICD-10-CM | POA: Diagnosis not present

## 2016-11-04 DIAGNOSIS — R262 Difficulty in walking, not elsewhere classified: Secondary | ICD-10-CM | POA: Diagnosis not present

## 2016-11-04 DIAGNOSIS — Z9181 History of falling: Secondary | ICD-10-CM | POA: Diagnosis not present

## 2016-11-04 DIAGNOSIS — E209 Hypoparathyroidism, unspecified: Secondary | ICD-10-CM | POA: Diagnosis not present

## 2016-11-11 DIAGNOSIS — Z1231 Encounter for screening mammogram for malignant neoplasm of breast: Secondary | ICD-10-CM | POA: Diagnosis not present

## 2016-11-11 DIAGNOSIS — T50905A Adverse effect of unspecified drugs, medicaments and biological substances, initial encounter: Secondary | ICD-10-CM | POA: Diagnosis not present

## 2016-11-11 DIAGNOSIS — I10 Essential (primary) hypertension: Secondary | ICD-10-CM | POA: Diagnosis not present

## 2016-11-13 DIAGNOSIS — Z79899 Other long term (current) drug therapy: Secondary | ICD-10-CM | POA: Diagnosis not present

## 2016-11-13 DIAGNOSIS — M25569 Pain in unspecified knee: Secondary | ICD-10-CM | POA: Diagnosis not present

## 2016-11-13 DIAGNOSIS — G43719 Chronic migraine without aura, intractable, without status migrainosus: Secondary | ICD-10-CM | POA: Diagnosis not present

## 2016-11-13 DIAGNOSIS — G894 Chronic pain syndrome: Secondary | ICD-10-CM | POA: Diagnosis not present

## 2016-11-13 DIAGNOSIS — E559 Vitamin D deficiency, unspecified: Secondary | ICD-10-CM | POA: Diagnosis not present

## 2016-11-14 DIAGNOSIS — Z79899 Other long term (current) drug therapy: Secondary | ICD-10-CM | POA: Diagnosis not present

## 2016-11-14 DIAGNOSIS — E538 Deficiency of other specified B group vitamins: Secondary | ICD-10-CM | POA: Diagnosis not present

## 2016-11-14 DIAGNOSIS — M818 Other osteoporosis without current pathological fracture: Secondary | ICD-10-CM | POA: Diagnosis not present

## 2016-11-14 DIAGNOSIS — R5383 Other fatigue: Secondary | ICD-10-CM | POA: Diagnosis not present

## 2016-11-14 DIAGNOSIS — E559 Vitamin D deficiency, unspecified: Secondary | ICD-10-CM | POA: Diagnosis not present

## 2016-11-16 DIAGNOSIS — J449 Chronic obstructive pulmonary disease, unspecified: Secondary | ICD-10-CM | POA: Diagnosis not present

## 2016-12-09 ENCOUNTER — Other Ambulatory Visit (HOSPITAL_COMMUNITY): Payer: Self-pay | Admitting: Cardiothoracic Surgery

## 2016-12-09 DIAGNOSIS — Z1231 Encounter for screening mammogram for malignant neoplasm of breast: Secondary | ICD-10-CM

## 2016-12-17 DIAGNOSIS — J449 Chronic obstructive pulmonary disease, unspecified: Secondary | ICD-10-CM | POA: Diagnosis not present

## 2016-12-24 ENCOUNTER — Ambulatory Visit (INDEPENDENT_AMBULATORY_CARE_PROVIDER_SITE_OTHER): Payer: Medicare Other | Admitting: Cardiology

## 2016-12-24 ENCOUNTER — Encounter: Payer: Self-pay | Admitting: Cardiology

## 2016-12-24 VITALS — BP 135/89 | HR 66 | Ht 62.0 in | Wt 206.0 lb

## 2016-12-24 DIAGNOSIS — R42 Dizziness and giddiness: Secondary | ICD-10-CM | POA: Diagnosis not present

## 2016-12-24 DIAGNOSIS — I10 Essential (primary) hypertension: Secondary | ICD-10-CM | POA: Diagnosis not present

## 2016-12-24 DIAGNOSIS — I5032 Chronic diastolic (congestive) heart failure: Secondary | ICD-10-CM

## 2016-12-24 NOTE — Progress Notes (Signed)
Clinical Summary [name] is a 71 y.o.female seen today for follow up of the following medical problems.   1. Lightheadedness/Dizziness  - started approx 1 year ago. Feeling of lightheadedness, but at times can feel like the room is spinning. Can happen laying down, sitting down, or standing. No association w/ exertion.Sometimes brought on by rolling over in bed. - event monitor shows just mild sinus brady which is her baseline   -  No recent symptoms since last visit    2. Chronic diastolic heart failure - echo 08/2014 LVEF 60-65%, abnormal diastolic function indeterminate grade.  - bilateral LE edema, started around December - not improved with HCTZ started by her pcp   - continued swelling in legs at times  3. HTN - lisinopril changed recently due to cough to losartan - compliatn with meds.    Past Medical History:  Diagnosis Date  . Adenomatous colon polyp 2006   excised in 2006 & 2010Due surveillance 06/2013  . Anxiety   . Anxiety and depression   . Asthma   . Breast tumor   . Chest discomfort   . Chronic back pain   . Chronic constipation   . Complication of anesthesia   . Depression   . Diverticulosis   . Fibromyalgia   . Gastroesophageal reflux disease   . Hiatal hernia   . History of benign esophageal tumor 2010   granular cell esophageal tumor (Dx 06/2008), resected via EMR 2011, due repeat EGD 02/2012  . Hyperlipidemia   . Hypertension   . Insomnia   . Migraines   . PONV (postoperative nausea and vomiting)   . Psychosis   . Sleep apnea    Stop Bang score of 5. Pt said she was told by Dr. Merlene Laughter that she had "a little bit" of sleep apena, but not bad enough to treat.  . Thyroid nodule   . Tumor of esophagus      Allergies  Allergen Reactions  . Elavil [Amitriptyline] Other (See Comments)    Felt really nervous and felt like something was hold her feet or arm and/ or AM headache on it and back on it and went away off it.   . Abilify  [Aripiprazole] Other (See Comments)    Dystonic reaction  . Trazodone And Nefazodone Other (See Comments)    Brought back bad dreams of things in the past  . Codeine Hives, Nausea Only and Other (See Comments)    Feels funny  . Latex Hives  . Penicillin V Itching  . Penicillins Hives    Has patient had a PCN reaction causing immediate rash, facial/tongue/throat swelling, SOB or lightheadedness with hypotension: Yes Has patient had a PCN reaction causing severe rash involving mucus membranes or skin necrosis: No Has patient had a PCN reaction that required hospitalization No Has patient had a PCN reaction occurring within the last 10 years: No If all of the above answers are "NO", then may proceed with Cephalosporin use.   . Polyethylene Glycol Other (See Comments)    Per allergy test  . Sulfonamide Derivatives Nausea And Vomiting  . Cymbalta [Duloxetine Hcl] Rash  . Remeron [Mirtazapine] Other (See Comments)    Caused lots of strange, weird, crazy dreams.     Current Outpatient Prescriptions  Medication Sig Dispense Refill  . acetaminophen (TYLENOL) 500 MG tablet Take 500 mg by mouth every 8 (eight) hours as needed for mild pain or moderate pain.    Marland Kitchen albuterol (PROVENTIL HFA;VENTOLIN  HFA) 108 (90 BASE) MCG/ACT inhaler Inhale 2 puffs into the lungs every 6 (six) hours as needed. For shortness of breath    . amLODipine (NORVASC) 5 MG tablet Take 10 mg by mouth daily.     Marland Kitchen atenolol (TENORMIN) 50 MG tablet Take 50 mg by mouth 2 (two) times daily. Taking when not on Hydralazine HCL    . azithromycin (ZITHROMAX) 250 MG tablet Take by mouth daily.    Marland Kitchen CALCIUM-VITAMIN D PO Take by mouth daily.    . cholecalciferol (VITAMIN D) 1000 units tablet Take 1,000 Units by mouth daily. Reported on 10/24/2015    . clonazePAM (KLONOPIN) 1 MG tablet Take 1 tablet (1 mg total) by mouth 3 (three) times daily. 90 tablet 2  . fluticasone (FLONASE) 50 MCG/ACT nasal spray Place 1 spray into both nostrils 2  (two) times daily.    . furosemide (LASIX) 40 MG tablet TAKE 1 TABLET BY MOUTH DAILY AS NEEDED FOR SWELLING 90 tablet 3  . hydrALAZINE (APRESOLINE) 50 MG tablet Take 100 mg by mouth daily. Taking when not on Atenolol    . HYDROcodone-acetaminophen (NORCO/VICODIN) 5-325 MG tablet Take one tab po q 4-6 hrs prn pain 10 tablet 0  . ibuprofen (ADVIL,MOTRIN) 800 MG tablet Take 800 mg by mouth 3 (three) times daily as needed.    . loratadine (CLARITIN) 10 MG tablet Take 10 mg by mouth daily. Reported on 12/19/2015    . losartan (COZAAR) 25 MG tablet Take 25 mg by mouth daily.    . methylPREDNISolone (MEDROL DOSEPAK) 4 MG TBPK tablet Take by mouth.    Marland Kitchen MYRBETRIQ 25 MG TB24 tablet Take 1 tablet by mouth daily.    . naproxen (NAPROSYN) 500 MG tablet Take 1 tablet (500 mg total) by mouth 2 (two) times daily with a meal. 20 tablet 0  . pantoprazole (PROTONIX) 40 MG tablet Take 1 tablet (40 mg total) by mouth daily. 30 tablet 11  . potassium chloride SA (K-DUR,KLOR-CON) 20 MEQ tablet Take by mouth.    . QUEtiapine (SEROQUEL) 100 MG tablet Take 1 tablet (100 mg total) by mouth at bedtime. 30 tablet 2  . spironolactone (ALDACTONE) 25 MG tablet Take 25 mg by mouth daily.    Marland Kitchen topiramate (TOPAMAX) 50 MG tablet Take 100 mg by mouth daily.    Marland Kitchen venlafaxine XR (EFFEXOR XR) 75 MG 24 hr capsule Take 1 capsule (75 mg total) by mouth daily with breakfast. 30 capsule 2  . Vitamin D, Ergocalciferol, (DRISDOL) 50000 units CAPS capsule Take 50,000 Units by mouth every 7 (seven) days.     No current facility-administered medications for this visit.      Past Surgical History:  Procedure Laterality Date  . ABDOMINAL HYSTERECTOMY    . BRAVO [place] STUDY  12/11/2011   Procedure: BRAVO [place];  Surgeon: Daneil Dolin, MD;  Location: [facility];  Service: Endoscopy;  Laterality: N/A;  . 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 CHOLECYSTECTOMY N/A 04/09/2013   Procedure:  LAPAROSCOPIC CHOLECYSTECTOMY;  Surgeon: Jamesetta So, MD;  Location: [facility] ORS;  Service: General;  Laterality: N/A;  . COLONOSCOPY  06/2008, 06/2011   sigmoid tics, tubular adenoma; 2013: anal canal hemorrhoids  . COLONOSCOPY  07/10/2011   Anal canal hemorrhoids likely the cause of hematochezia in the setting of constipation; otherwise normal rectum ;submucosal  petechiae in left colon of doubtful clinical significance; otherwise, normal colon  . ESOPHAGEAL DILATION N/A  05/08/2015   Procedure: ESOPHAGEAL DILATION;  Surgeon: Daneil Dolin, MD;  Location: [facility] ORS;  Service: Endoscopy;  Laterality: N/AVenia Minks 54/56  . ESOPHAGOGASTRODUODENOSCOPY  12/11/2011   [id number] Schatzki's ring; otherwise normal/Small hiatal hernia. Antral and bulbar erosions  . ESOPHAGOGASTRODUODENOSCOPY (EGD) WITH PROPOFOL N/A 05/08/2015   Dr. Gala Romney: mild erosive reflux esophagitis, non-critical Schatzki's ring s/p dilation. Hiatal hernia.   . EUS  08/2010   Dr [facility] with EGD. Retained food. No recurrent esophageal lesion, bx negative.  Marland Kitchen LUNG BIOPSY     negative  . PARATHYROIDECTOMY  April 2017   [facility].   Marland Kitchen RIGHT OOPHORECTOMY     benign disease  . SHOULDER ARTHROSCOPY     Right; bone spurs removed  . TONSILLECTOMY    . TOTAL KNEE ARTHROPLASTY  2002   Right; previous arthroscopic surgery     Allergies  Allergen Reactions  . Elavil [Amitriptyline] Other (See Comments)    Felt really nervous and felt like something was hold her feet or arm and/ or AM headache on it and back on it and went away off it.   . Abilify [Aripiprazole] Other (See Comments)    Dystonic reaction  . Trazodone And Nefazodone Other (See Comments)    Brought back bad dreams of things in the past  . Codeine Hives, Nausea Only and Other (See Comments)    Feels funny  . Latex Hives  . Penicillin V Itching  . Penicillins Hives    Has patient had a PCN reaction causing immediate rash, facial/tongue/throat swelling, SOB or  lightheadedness with hypotension: Yes Has patient had a PCN reaction causing severe rash involving mucus membranes or skin necrosis: No Has patient had a PCN reaction that required hospitalization No Has patient had a PCN reaction occurring within the last 10 years: No If all of the above answers are "NO", then may proceed with Cephalosporin use.   . Polyethylene Glycol Other (See Comments)    Per allergy test  . Sulfonamide Derivatives Nausea And Vomiting  . Cymbalta [Duloxetine Hcl] Rash  . Remeron [Mirtazapine] Other (See Comments)    Caused lots of strange, weird, crazy dreams.      Family History  Problem Relation Age of Onset  . Stroke Father   . Alcohol abuse Father   . Heart attack Mother   . Depression Mother   . Anxiety disorder Mother   . Colon cancer Paternal Aunt   . Colon cancer Paternal Uncle   . Dementia Maternal Uncle   . ADD / ADHD Neg Hx   . Bipolar disorder Neg Hx   . Drug abuse Neg Hx   . OCD Neg Hx   . Paranoid behavior Neg Hx   . Schizophrenia Neg Hx   . Seizures Neg Hx   . Sexual abuse Neg Hx   . Physical abuse Neg Hx      Social History [name] reports that she has never smoked. She has never used smokeless tobacco. [name] reports that she does not drink alcohol.   Review of Systems CONSTITUTIONAL: No weight loss, fever, chills, weakness or fatigue.  HEENT: Eyes: No visual loss, blurred vision, double vision or yellow sclerae.No hearing loss, sneezing, congestion, runny nose or sore throat.  SKIN: No rash or itching.  CARDIOVASCULAR: per hpi RESPIRATORY: No shortness of breath, cough or sputum.  GASTROINTESTINAL: No anorexia, nausea, vomiting or diarrhea. No abdominal pain or blood.  GENITOURINARY: No burning on urination, no polyuria NEUROLOGICAL: No headache, dizziness,  syncope, paralysis, ataxia, numbness or tingling in the extremities. No change in bowel or bladder control.  MUSCULOSKELETAL: No muscle, back pain, joint pain or  stiffness.  LYMPHATICS: No enlarged nodes. No history of splenectomy.  PSYCHIATRIC: No history of depression or anxiety.  ENDOCRINOLOGIC: No reports of sweating, cold or heat intolerance. No polyuria or polydipsia.  Marland Kitchen   Physical Examination Vitals:   12/24/16 0957  BP: 135/89  Pulse: 66   Vitals:   12/24/16 0957  Weight: 206 lb (93.4 kg)  Height: 5\' 2"  (1.575 m)    Gen: resting comfortably, no acute distress HEENT: no scleral icterus, pupils equal round and reactive, no palptable cervical adenopathy,  CV: RRR, no m/r/g, no jvd Resp: Clear to auscultation bilaterally GI: abdomen is soft, non-tender, non-distended, normal bowel sounds, no hepatosplenomegaly MSK: extremities are warm, no edema.  Skin: warm, no rash Neuro:  no focal deficits Psych: appropriate affect   Diagnostic Studies 02/17/13 Event monitor: baseline sinus brady in 50s, symptoms of dizziness correlate w/ sinus brady in 50s. Occasional PVCs, 1 PVC couplet.   01/2012 Carotid US: minimal plaque at bifurcations, no stenosis   10/2014 echo Study Conclusions  - Left ventricle: The cavity size was normal. Wall thickness was increased in a pattern of mild LVH. Systolic function was normal. The estimated ejection fraction was in the range of 60% to 65%. Abnormal diastoilc function, indeterminate grade. - Aortic valve: There was mild regurgitation. Valve area (VTI): 2.48 cm^2. Valve area (Vmax): 2.89 cm^2. - Technically adequate study.   Assessment and Plan   1. Dizziness  - symptoms most suggestive of vertigo, as they are positional and often brought on with changing positions while laying down, feeling of room spinning - no recent symptoms   2. Chronic diastolic HF - edema at times, continue diuretics  3. HTN - mildly above goal in clinic given her DM2.  - submit bp log in 1 week  F/u 4 months     Arnoldo Lenis, M.D.

## 2016-12-24 NOTE — Patient Instructions (Signed)
Your physician recommends that you schedule a follow-up appointment in: 4 months   START Lasix 20 mg daily as needed for leg swelling    Keep a blood pressure log for 1 week and call us back with readings.     No testing or lab work ordered today.      Thank you for choosing [facility] !

## 2016-12-30 ENCOUNTER — Telehealth: Payer: Self-pay | Admitting: Cardiology

## 2016-12-30 MED ORDER — FUROSEMIDE 20 MG PO TABS: ORAL_TABLET | ORAL | 3 refills | Status: DC

## 2016-12-30 NOTE — Telephone Encounter (Signed)
[facility] stating they have not received a Rx for the pt's Lasix

## 2016-12-30 NOTE — Telephone Encounter (Signed)
escribed lasix order to [facility]

## 2017-01-09 DIAGNOSIS — Z1389 Encounter for screening for other disorder: Secondary | ICD-10-CM | POA: Diagnosis not present

## 2017-01-09 DIAGNOSIS — E039 Hypothyroidism, unspecified: Secondary | ICD-10-CM | POA: Diagnosis not present

## 2017-01-09 DIAGNOSIS — E785 Hyperlipidemia, unspecified: Secondary | ICD-10-CM | POA: Diagnosis not present

## 2017-01-09 DIAGNOSIS — R109 Unspecified abdominal pain: Secondary | ICD-10-CM | POA: Diagnosis not present

## 2017-01-09 DIAGNOSIS — I10 Essential (primary) hypertension: Secondary | ICD-10-CM | POA: Diagnosis not present

## 2017-01-09 DIAGNOSIS — R739 Hyperglycemia, unspecified: Secondary | ICD-10-CM | POA: Diagnosis not present

## 2017-01-15 ENCOUNTER — Other Ambulatory Visit (HOSPITAL_COMMUNITY): Payer: Self-pay | Admitting: Internal Medicine

## 2017-01-15 DIAGNOSIS — Z1231 Encounter for screening mammogram for malignant neoplasm of breast: Secondary | ICD-10-CM

## 2017-01-16 DIAGNOSIS — J449 Chronic obstructive pulmonary disease, unspecified: Secondary | ICD-10-CM | POA: Diagnosis not present

## 2017-01-17 ENCOUNTER — Other Ambulatory Visit (HOSPITAL_COMMUNITY): Payer: Self-pay

## 2017-01-17 ENCOUNTER — Other Ambulatory Visit: Payer: Self-pay

## 2017-01-17 ENCOUNTER — Ambulatory Visit (HOSPITAL_COMMUNITY)
Admission: RE | Admit: 2017-01-17 | Discharge: 2017-01-17 | Disposition: A | Discharge status: Home / Self Care | Payer: Medicare Other | Source: Ambulatory Visit | Attending: Internal Medicine | Admitting: Internal Medicine

## 2017-01-17 DIAGNOSIS — Z1231 Encounter for screening mammogram for malignant neoplasm of breast: Secondary | ICD-10-CM | POA: Diagnosis not present

## 2017-01-17 MED ORDER — PANTOPRAZOLE SODIUM 40 MG PO TBEC: 40.0000 mg | DELAYED_RELEASE_TABLET | Freq: Every day | ORAL | 3 refills | Status: DC

## 2017-01-20 ENCOUNTER — Other Ambulatory Visit: Payer: Self-pay

## 2017-01-20 DIAGNOSIS — R350 Frequency of micturition: Secondary | ICD-10-CM | POA: Diagnosis not present

## 2017-01-20 DIAGNOSIS — G2581 Restless legs syndrome: Secondary | ICD-10-CM | POA: Diagnosis not present

## 2017-01-20 DIAGNOSIS — I10 Essential (primary) hypertension: Secondary | ICD-10-CM | POA: Diagnosis not present

## 2017-01-21 MED ORDER — PANTOPRAZOLE SODIUM 40 MG PO TBEC: 40.0000 mg | DELAYED_RELEASE_TABLET | Freq: Every day | ORAL | 3 refills | Status: DC

## 2017-01-23 ENCOUNTER — Telehealth: Payer: Self-pay

## 2017-01-23 NOTE — Telephone Encounter (Signed)
Pt made aware, she voiced understanding.  

## 2017-01-23 NOTE — Telephone Encounter (Signed)
-----   Message from Arnoldo Lenis, MD sent at 01/23/2017  3:40 PM EDT ----- BP log reviewed from last month, numbers look good. No med changes  Zandra Abts MD

## 2017-03-03 DIAGNOSIS — R600 Localized edema: Secondary | ICD-10-CM | POA: Insufficient documentation

## 2017-03-03 DIAGNOSIS — R202 Paresthesia of skin: Secondary | ICD-10-CM | POA: Diagnosis not present

## 2017-03-03 DIAGNOSIS — R7303 Prediabetes: Secondary | ICD-10-CM | POA: Diagnosis not present

## 2017-03-03 DIAGNOSIS — I10 Essential (primary) hypertension: Secondary | ICD-10-CM | POA: Diagnosis not present

## 2017-03-07 DIAGNOSIS — Z6830 Body mass index (BMI) 30.0-30.9, adult: Secondary | ICD-10-CM | POA: Insufficient documentation

## 2017-03-07 DIAGNOSIS — M19041 Primary osteoarthritis, right hand: Secondary | ICD-10-CM | POA: Insufficient documentation

## 2017-03-07 DIAGNOSIS — M19042 Primary osteoarthritis, left hand: Secondary | ICD-10-CM | POA: Insufficient documentation

## 2017-03-07 DIAGNOSIS — E669 Obesity, unspecified: Secondary | ICD-10-CM | POA: Insufficient documentation

## 2017-03-11 DIAGNOSIS — G3184 Mild cognitive impairment, so stated: Secondary | ICD-10-CM | POA: Diagnosis not present

## 2017-03-11 DIAGNOSIS — G894 Chronic pain syndrome: Secondary | ICD-10-CM | POA: Diagnosis not present

## 2017-03-11 DIAGNOSIS — Z79899 Other long term (current) drug therapy: Secondary | ICD-10-CM | POA: Diagnosis not present

## 2017-03-11 DIAGNOSIS — G43719 Chronic migraine without aura, intractable, without status migrainosus: Secondary | ICD-10-CM | POA: Diagnosis not present

## 2017-03-11 DIAGNOSIS — E559 Vitamin D deficiency, unspecified: Secondary | ICD-10-CM | POA: Diagnosis not present

## 2017-03-19 DIAGNOSIS — Z79899 Other long term (current) drug therapy: Secondary | ICD-10-CM | POA: Diagnosis not present

## 2017-03-19 DIAGNOSIS — G4700 Insomnia, unspecified: Secondary | ICD-10-CM | POA: Diagnosis not present

## 2017-03-27 DIAGNOSIS — R202 Paresthesia of skin: Secondary | ICD-10-CM | POA: Diagnosis not present

## 2017-03-27 DIAGNOSIS — R600 Localized edema: Secondary | ICD-10-CM | POA: Diagnosis not present

## 2017-04-28 ENCOUNTER — Ambulatory Visit (INDEPENDENT_AMBULATORY_CARE_PROVIDER_SITE_OTHER): Payer: Medicare Other | Admitting: Cardiology

## 2017-04-28 ENCOUNTER — Encounter: Payer: Self-pay | Admitting: Cardiology

## 2017-04-28 VITALS — BP 142/88 | HR 61 | Ht 62.0 in | Wt 210.0 lb

## 2017-04-28 DIAGNOSIS — I10 Essential (primary) hypertension: Secondary | ICD-10-CM | POA: Diagnosis not present

## 2017-04-28 DIAGNOSIS — I5032 Chronic diastolic (congestive) heart failure: Secondary | ICD-10-CM

## 2017-04-28 NOTE — Patient Instructions (Signed)
Medication Instructions:  Your physician recommends that you continue on your current medications as directed. Please refer to the Current Medication list given to you today.   Labwork: NONE   Testing/Procedures: NONE  Follow-Up: Your physician wants you to follow-up in: 6 Months with Dr. Branch. You will receive a reminder letter in the mail two months in advance. If you don't receive a letter, please call our office to schedule the follow-up appointment.   Any Other Special Instructions Will Be Listed Below (If Applicable).     If you need a refill on your cardiac medications before your next appointment, please call your pharmacy. Thank you for choosing [facility]!    

## 2017-04-28 NOTE — Progress Notes (Signed)
Clinical Summary [name] is a 71 y.o.female  seen today for follow up of the following medical problems.   1. Lightheadedness/Dizziness  - started approx 1 year ago. Feeling of lightheadedness, but at times can feel like the room is spinning. Can happen laying down, sitting down, or standing. No association w/ exertion.Sometimes brought on by rolling over in bed. - event monitor shows just mild sinus brady which is her baseline   - denies any recent significant symptoms.     2. Chronic diastolic heart failure - echo 08/2014 LVEF 60-65%, abnormal diastolic function indeterminate grade.  - bilateral LE edema, started around December - not improved with HCTZ started by her pcp    - LE edema at times. Limiting sodium intake. Takes lasix 20mg  prn.  - home weights are up and down, she does not standardize how she does it - no SOB/DOE or orthopnea.   3. HTN - 01/2017 home bp log was at goal - compliant with meds  4. Hyperlipidemia - followed by pcp    New pcp Dr Tasia Catchings in [place], [place]  Past Medical History:  Diagnosis Date  . Adenomatous colon polyp 2006   excised in 2006 & 2010Due surveillance 06/2013  . Anxiety   . Anxiety and depression   . Asthma   . Breast tumor   . Chest discomfort   . Chronic back pain   . Chronic constipation   . Complication of anesthesia   . Depression   . Diverticulosis   . Fibromyalgia   . Gastroesophageal reflux disease   . Hiatal hernia   . History of benign esophageal tumor 2010   granular cell esophageal tumor (Dx 06/2008), resected via EMR 2011, due repeat EGD 02/2012  . Hyperlipidemia   . Hypertension   . Insomnia   . Migraines   . PONV (postoperative nausea and vomiting)   . Psychosis   . Sleep apnea    Stop Bang score of 5. Pt said she was told by Dr. Merlene Laughter that she had "a little bit" of sleep apena, but not bad enough to treat.  . Thyroid nodule   . Tumor of esophagus      Allergies  Allergen Reactions    . Elavil [Amitriptyline] Other (See Comments)    Felt really nervous and felt like something was hold her feet or arm and/ or AM headache on it and back on it and went away off it.   . Abilify [Aripiprazole] Other (See Comments)    Dystonic reaction  . Trazodone And Nefazodone Other (See Comments)    Brought back bad dreams of things in the past  . Codeine Hives, Nausea Only and Other (See Comments)    Feels funny  . Latex Hives  . Penicillin V Itching  . Penicillins Hives    Has patient had a PCN reaction causing immediate rash, facial/tongue/throat swelling, SOB or lightheadedness with hypotension: Yes Has patient had a PCN reaction causing severe rash involving mucus membranes or skin necrosis: No Has patient had a PCN reaction that required hospitalization No Has patient had a PCN reaction occurring within the last 10 years: No If all of the above answers are "NO", then may proceed with Cephalosporin use.   . Polyethylene Glycol Other (See Comments)    Per allergy test  . Sulfonamide Derivatives Nausea And Vomiting  . Cymbalta [Duloxetine Hcl] Rash  . Remeron [Mirtazapine] Other (See Comments)    Caused lots of strange, weird, crazy  dreams.     Current Outpatient Medications  Medication Sig Dispense Refill  . acetaminophen (TYLENOL) 500 MG tablet Take 500 mg by mouth every 8 (eight) hours as needed for mild pain or moderate pain.    Marland Kitchen albuterol (PROVENTIL HFA;VENTOLIN HFA) 108 (90 BASE) MCG/ACT inhaler Inhale 2 puffs into the lungs every 6 (six) hours as needed. For shortness of breath    . amLODipine (NORVASC) 5 MG tablet Take 10 mg by mouth daily.     Marland Kitchen atenolol (TENORMIN) 100 MG tablet Take 100 mg by mouth daily.    Marland Kitchen CALCIUM-VITAMIN D PO Take by mouth daily.    . cholecalciferol (VITAMIN D) 1000 units tablet Take 1,000 Units by mouth daily. Reported on 10/24/2015    . dexlansoprazole (DEXILANT) 60 MG capsule Take 60 mg by mouth daily.    . fluticasone (FLONASE) 50 MCG/ACT  nasal spray Place 1 spray into both nostrils 2 (two) times daily.    . furosemide (LASIX) 20 MG tablet Take 20 mg daily as needed for leg swelling 90 tablet 3  . HYDROcodone-acetaminophen (NORCO/VICODIN) 5-325 MG tablet Take one tab po q 4-6 hrs prn pain 10 tablet 0  . ibuprofen (ADVIL,MOTRIN) 800 MG tablet Take 800 mg by mouth 3 (three) times daily as needed.    . loratadine (CLARITIN) 10 MG tablet Take 10 mg by mouth daily. Reported on 12/19/2015    . LORazepam (ATIVAN) 0.5 MG tablet Take 0.5 mg by mouth 2 (two) times daily.    Marland Kitchen losartan (COZAAR) 25 MG tablet Take 25 mg by mouth daily.    Marland Kitchen lovastatin (MEVACOR) 40 MG tablet Take 40 mg by mouth at bedtime.    . Melatonin 3 MG TABS Take 6 mg by mouth at bedtime.    . memantine (NAMENDA) 5 MG tablet Take 5 mg by mouth 2 (two) times daily.    . metFORMIN (GLUCOPHAGE-XR) 500 MG 24 hr tablet Take 500 mg by mouth daily with breakfast.    . OLANZapine (ZYPREXA) 2.5 MG tablet Take 2.5 mg by mouth daily as needed.    Marland Kitchen OLANZapine (ZYPREXA) 7.5 MG tablet Take 7.5 mg by mouth at bedtime.    . pantoprazole (PROTONIX) 40 MG tablet Take 1 tablet (40 mg total) by mouth daily. 90 tablet 3  . spironolactone (ALDACTONE) 25 MG tablet Take 25 mg by mouth daily.    Marland Kitchen topiramate (TOPAMAX) 25 MG capsule Take 25 mg by mouth 3 (three) times daily.    Marland Kitchen venlafaxine XR (EFFEXOR-XR) 150 MG 24 hr capsule Take 150 mg by mouth 2 (two) times daily.    Marland Kitchen venlafaxine XR (EFFEXOR-XR) 75 MG 24 hr capsule Take 75 mg by mouth 2 (two) times daily.    . Vitamin D, Ergocalciferol, (DRISDOL) 50000 units CAPS capsule Take 50,000 Units by mouth every 7 (seven) days.     No current facility-administered medications for this visit.      Past Surgical History:  Procedure Laterality Date  . ABDOMINAL HYSTERECTOMY    . 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 COLONOSCOPY  06/2008, 06/2011   sigmoid tics, tubular adenoma; 2013: anal canal  hemorrhoids  . EUS  08/2010   Dr [facility] with EGD. Retained food. No recurrent esophageal lesion, bx negative.  Marland Kitchen LUNG BIOPSY     negative  . PARATHYROIDECTOMY  April 2017   [facility].   Marland Kitchen RIGHT OOPHORECTOMY     benign disease  . SHOULDER ARTHROSCOPY  Right; bone spurs removed  . TONSILLECTOMY    . TOTAL KNEE ARTHROPLASTY  2002   Right; previous arthroscopic surgery     Allergies  Allergen Reactions  . Elavil [Amitriptyline] Other (See Comments)    Felt really nervous and felt like something was hold her feet or arm and/ or AM headache on it and back on it and went away off it.   . Abilify [Aripiprazole] Other (See Comments)    Dystonic reaction  . Trazodone And Nefazodone Other (See Comments)    Brought back bad dreams of things in the past  . Codeine Hives, Nausea Only and Other (See Comments)    Feels funny  . Latex Hives  . Penicillin V Itching  . Penicillins Hives    Has patient had a PCN reaction causing immediate rash, facial/tongue/throat swelling, SOB or lightheadedness with hypotension: Yes Has patient had a PCN reaction causing severe rash involving mucus membranes or skin necrosis: No Has patient had a PCN reaction that required hospitalization No Has patient had a PCN reaction occurring within the last 10 years: No If all of the above answers are "NO", then may proceed with Cephalosporin use.   . Polyethylene Glycol Other (See Comments)    Per allergy test  . Sulfonamide Derivatives Nausea And Vomiting  . Cymbalta [Duloxetine Hcl] Rash  . Remeron [Mirtazapine] Other (See Comments)    Caused lots of strange, weird, crazy dreams.      Family History  Problem Relation Age of Onset  . Stroke Father   . Alcohol abuse Father   . Heart attack Mother   . Depression Mother   . Anxiety disorder Mother   . Colon cancer Paternal Aunt   . Colon cancer Paternal Uncle   . Dementia Maternal Uncle   . ADD / ADHD Neg Hx   . Bipolar disorder Neg Hx   . Drug abuse  Neg Hx   . OCD Neg Hx   . Paranoid behavior Neg Hx   . Schizophrenia Neg Hx   . Seizures Neg Hx   . Sexual abuse Neg Hx   . Physical abuse Neg Hx      Social History Ms. Navia reports that  has never smoked. she has never used smokeless tobacco. Ms. Gardenhire reports that she does not drink alcohol.   Review of Systems CONSTITUTIONAL: No weight loss, fever, chills, weakness or fatigue.  HEENT: Eyes: No visual loss, blurred vision, double vision or yellow sclerae.No hearing loss, sneezing, congestion, runny nose or sore throat.  SKIN: No rash or itching.  CARDIOVASCULAR: per hpi RESPIRATORY: per hpi GASTROINTESTINAL: No anorexia, nausea, vomiting or diarrhea. No abdominal pain or blood.  GENITOURINARY: No burning on urination, no polyuria NEUROLOGICAL: No headache, dizziness, syncope, paralysis, ataxia, numbness or tingling in the extremities. No change in bowel or bladder control.  MUSCULOSKELETAL: No muscle, back pain, joint pain or stiffness.  LYMPHATICS: No enlarged nodes. No history of splenectomy.  PSYCHIATRIC: No history of depression or anxiety.  ENDOCRINOLOGIC: No reports of sweating, cold or heat intolerance. No polyuria or polydipsia.  Marland Kitchen   Physical Examination Vitals:   04/28/17 1000  BP: (!) 142/88  Pulse: 61  SpO2: 98%   Vitals:   04/28/17 1000  Weight: 210 lb (95.3 kg)  Height: 5\' 2"  (1.575 m)    Gen: resting comfortably, no acute distress HEENT: no scleral icterus, pupils equal round and reactive, no palptable cervical adenopathy,  CV: RRR, no m/r/g, no jvd Resp: Clear to  auscultation bilaterally GI: abdomen is soft, non-tender, non-distended, normal bowel sounds, no hepatosplenomegaly MSK: extremities are warm, no edema.  Skin: warm, no rash Neuro:  no focal deficits Psych: appropriate affect   Diagnostic Studies 02/17/13 Event monitor: baseline sinus brady in 50s, symptoms of dizziness correlate w/ sinus brady in 50s. Occasional PVCs, 1 PVC  couplet.   01/2012 Carotid US: minimal plaque at bifurcations, no stenosis   10/2014 echo Study Conclusions  - Left ventricle: The cavity size was normal. Wall thickness was increased in a pattern of mild LVH. Systolic function was normal. The estimated ejection fraction was in the range of 60% to 65%. Abnormal diastoilc function, indeterminate grade. - Aortic valve: There was mild regurgitation. Valve area (VTI): 2.48 cm^2. Valve area (Vmax): 2.89 cm^2. - Technically adequate study.    Assessment and Plan   1. Chronic diastolic HF - leg edema at times, encouraged to take her lasix more regularly as needed. Continue to limit sodium intake.   2. HTN - above goal, her bp log submitted in 01/2017 was at goal. Office bp's run higher at times - cotninue to monitor, continue current meds    F/u 6 months     Arnoldo Lenis, M.D

## 2017-05-07 DIAGNOSIS — G43711 Chronic migraine without aura, intractable, with status migrainosus: Secondary | ICD-10-CM | POA: Diagnosis not present

## 2017-05-07 DIAGNOSIS — M542 Cervicalgia: Secondary | ICD-10-CM | POA: Diagnosis not present

## 2017-05-07 DIAGNOSIS — Z79899 Other long term (current) drug therapy: Secondary | ICD-10-CM | POA: Diagnosis not present

## 2017-05-07 DIAGNOSIS — I10 Essential (primary) hypertension: Secondary | ICD-10-CM | POA: Diagnosis not present

## 2017-06-10 ENCOUNTER — Other Ambulatory Visit: Payer: Self-pay | Admitting: Internal Medicine

## 2017-06-12 NOTE — Telephone Encounter (Signed)
Spoke with pt and she mentioned that she was hospitalized December 2017- May 2018 at [facility]. One of the physicians wanted to take Pantoprazole and Dexilant(one qam the other qpm). Pt is currently taking pantoprazole and is out of Dexilant.

## 2017-06-12 NOTE — Telephone Encounter (Signed)
Please confirm which PPI patient is on. Dexilant or pantoprazole. She has both on her list.

## 2017-06-14 NOTE — Telephone Encounter (Signed)
Unfortunately we have not seen her in over a year. We have had her on protonix daily. Has she been on both lately? Let me know.  I would not advise her to be on both Dexilant and protonix long term.

## 2017-06-19 ENCOUNTER — Encounter: Payer: Self-pay | Admitting: Internal Medicine

## 2017-06-19 NOTE — Telephone Encounter (Signed)
PATIENT SCHEDULED  °

## 2017-06-19 NOTE — Telephone Encounter (Signed)
Pt is going to make an appointment today. Pt would like to know if there is a way that she can get a refill until her appointment.    Please schedule apt with provider.

## 2017-06-25 NOTE — Telephone Encounter (Signed)
Noted  

## 2017-06-25 NOTE — Telephone Encounter (Signed)
As below, I don't advise she take BOTH protonix and Dexilant. Continue pantoprazole until OV.

## 2017-07-25 ENCOUNTER — Other Ambulatory Visit (HOSPITAL_COMMUNITY): Payer: Self-pay | Admitting: Geriatric Medicine

## 2017-07-25 ENCOUNTER — Other Ambulatory Visit (HOSPITAL_COMMUNITY): Payer: Self-pay | Admitting: Cardiothoracic Surgery

## 2017-07-25 ENCOUNTER — Ambulatory Visit (HOSPITAL_COMMUNITY)
Admission: RE | Admit: 2017-07-25 | Discharge: 2017-07-25 | Disposition: A | Discharge status: Home / Self Care | Payer: Medicare HMO | Source: Ambulatory Visit | Attending: Geriatric Medicine | Admitting: Geriatric Medicine

## 2017-07-25 ENCOUNTER — Ambulatory Visit (HOSPITAL_COMMUNITY)
Admission: RE | Admit: 2017-07-25 | Discharge: 2017-07-25 | Disposition: A | Discharge status: Home / Self Care | Payer: Medicare HMO | Source: Ambulatory Visit | Attending: Cardiothoracic Surgery | Admitting: Cardiothoracic Surgery

## 2017-07-25 ENCOUNTER — Encounter (HOSPITAL_COMMUNITY): Payer: Self-pay

## 2017-07-25 DIAGNOSIS — F13232 Sedative, hypnotic or anxiolytic dependence with withdrawal with perceptual disturbance: Secondary | ICD-10-CM | POA: Diagnosis not present

## 2017-07-25 DIAGNOSIS — E213 Hyperparathyroidism, unspecified: Secondary | ICD-10-CM | POA: Diagnosis present

## 2017-07-25 DIAGNOSIS — K219 Gastro-esophageal reflux disease without esophagitis: Secondary | ICD-10-CM | POA: Diagnosis not present

## 2017-07-25 DIAGNOSIS — M810 Age-related osteoporosis without current pathological fracture: Secondary | ICD-10-CM | POA: Insufficient documentation

## 2017-07-25 DIAGNOSIS — Z780 Asymptomatic menopausal state: Secondary | ICD-10-CM

## 2017-07-25 DIAGNOSIS — F418 Other specified anxiety disorders: Secondary | ICD-10-CM | POA: Diagnosis not present

## 2017-07-25 DIAGNOSIS — Z1231 Encounter for screening mammogram for malignant neoplasm of breast: Secondary | ICD-10-CM

## 2017-07-31 ENCOUNTER — Other Ambulatory Visit (HOSPITAL_COMMUNITY): Payer: Self-pay | Admitting: Family Medicine

## 2017-07-31 DIAGNOSIS — Z1231 Encounter for screening mammogram for malignant neoplasm of breast: Secondary | ICD-10-CM

## 2017-08-13 ENCOUNTER — Ambulatory Visit: Payer: Medicare Other | Admitting: Gastroenterology

## 2017-09-06 DIAGNOSIS — G4733 Obstructive sleep apnea (adult) (pediatric): Secondary | ICD-10-CM | POA: Insufficient documentation

## 2017-09-20 ENCOUNTER — Emergency Department (HOSPITAL_COMMUNITY): Payer: Medicare HMO

## 2017-09-20 ENCOUNTER — Emergency Department (HOSPITAL_COMMUNITY)
Admission: EM | Admit: 2017-09-20 | Discharge: 2017-09-20 | Disposition: A | Discharge status: Home / Self Care | Payer: Medicare HMO | Attending: Emergency Medicine | Admitting: Emergency Medicine

## 2017-09-20 ENCOUNTER — Encounter (HOSPITAL_COMMUNITY): Payer: Self-pay | Admitting: Emergency Medicine

## 2017-09-20 ENCOUNTER — Other Ambulatory Visit: Payer: Self-pay

## 2017-09-20 DIAGNOSIS — M25551 Pain in right hip: Secondary | ICD-10-CM | POA: Insufficient documentation

## 2017-09-20 DIAGNOSIS — S0081XA Abrasion of other part of head, initial encounter: Secondary | ICD-10-CM | POA: Insufficient documentation

## 2017-09-20 DIAGNOSIS — Y9389 Activity, other specified: Secondary | ICD-10-CM | POA: Insufficient documentation

## 2017-09-20 DIAGNOSIS — F419 Anxiety disorder, unspecified: Secondary | ICD-10-CM | POA: Diagnosis not present

## 2017-09-20 DIAGNOSIS — R51 Headache: Secondary | ICD-10-CM | POA: Diagnosis not present

## 2017-09-20 DIAGNOSIS — S0993XA Unspecified injury of face, initial encounter: Secondary | ICD-10-CM | POA: Diagnosis present

## 2017-09-20 DIAGNOSIS — W01198A Fall on same level from slipping, tripping and stumbling with subsequent striking against other object, initial encounter: Secondary | ICD-10-CM | POA: Diagnosis not present

## 2017-09-20 DIAGNOSIS — Y929 Unspecified place or not applicable: Secondary | ICD-10-CM | POA: Diagnosis not present

## 2017-09-20 DIAGNOSIS — Z9049 Acquired absence of other specified parts of digestive tract: Secondary | ICD-10-CM | POA: Insufficient documentation

## 2017-09-20 DIAGNOSIS — Z96651 Presence of right artificial knee joint: Secondary | ICD-10-CM | POA: Insufficient documentation

## 2017-09-20 DIAGNOSIS — Y998 Other external cause status: Secondary | ICD-10-CM | POA: Diagnosis not present

## 2017-09-20 DIAGNOSIS — J45909 Unspecified asthma, uncomplicated: Secondary | ICD-10-CM | POA: Insufficient documentation

## 2017-09-20 DIAGNOSIS — F329 Major depressive disorder, single episode, unspecified: Secondary | ICD-10-CM | POA: Insufficient documentation

## 2017-09-20 DIAGNOSIS — I10 Essential (primary) hypertension: Secondary | ICD-10-CM | POA: Diagnosis not present

## 2017-09-20 DIAGNOSIS — Z91040 Latex allergy status: Secondary | ICD-10-CM | POA: Diagnosis not present

## 2017-09-20 DIAGNOSIS — Z79899 Other long term (current) drug therapy: Secondary | ICD-10-CM | POA: Insufficient documentation

## 2017-09-20 DIAGNOSIS — W19XXXA Unspecified fall, initial encounter: Secondary | ICD-10-CM

## 2017-09-20 MED ORDER — HYDROCODONE-ACETAMINOPHEN 5-325 MG PO TABS
1.0000 | ORAL_TABLET | Freq: Once | ORAL | Status: AC
  Administered 2017-09-20: 1 via ORAL
  Filled 2017-09-20: qty 1

## 2017-09-20 NOTE — ED Notes (Signed)
Fall today .  States her ankle gave way with her and she went down on steps.  Has abrasion to right side of face and right hand and elbow.  C/o headache at this time.

## 2017-09-20 NOTE — ED Triage Notes (Addendum)
Patient states "my ankle gave way and I fell a few minutes ago." States she hit her head on the right side of head and face. Bruising noted to right side of face. Denies LOC. Patient alert, oriented, and ambulatory at triage. Denies blood thinners.

## 2017-09-20 NOTE — Discharge Instructions (Addendum)
Use Tylenol as needed for pain Return immediately if you have any increased headache or weakness on one side or the other or change in mental status Your right hip does not appear to be broken, however if you began having worsening pain, or unable to walk on it, it should be reevaluated by your doctor.

## 2017-09-20 NOTE — ED Provider Notes (Signed)
[facility] EMERGENCY DEPARTMENT Provider Note   CSN: [id number] Arrival date & time: 09/20/17  1359     History   Chief Complaint Chief Complaint  Patient presents with  . Fall  . Head Injury    HPI [name] is a 72 y.o. female.  HPI  72 year old female states that her ankle gave out and she fell to the ground.  She abraded the right side of her face when her glasses were struck.  She denies loss of consciousness.  She has a mild headache.  She is complaining of some pain in the lateral aspect of the right hip.Marland Kitchen  She was able to put weight on her hip.  Denies any preceding lightheadedness or pain.  Is not on blood thinners.  She requests that I call her sister who is her healthcare power of attorney.  Past Medical History:  Diagnosis Date  . Adenomatous colon polyp 2006   excised in 2006 & 2010Due surveillance 06/2013  . Anxiety   . Anxiety and depression   . Asthma   . Breast tumor   . Chest discomfort   . Chronic back pain   . Chronic constipation   . Complication of anesthesia   . Depression   . Diverticulosis   . Fibromyalgia   . Gastroesophageal reflux disease   . Hiatal hernia   . History of benign esophageal tumor 2010   granular cell esophageal tumor (Dx 06/2008), resected via EMR 2011, due repeat EGD 02/2012  . Hyperlipidemia   . Hypertension   . Insomnia   . Migraines   . PONV (postoperative nausea and vomiting)   . Psychosis ([place])   . Sleep apnea    Stop Bang score of 5. Pt said she was told by Dr. Merlene Laughter that she had "a little bit" of sleep apena, but not bad enough to treat.  . Thyroid nodule   . Tumor of esophagus     Patient Active Problem List   Diagnosis Date Noted  . Urinary frequency 11/07/2015  . Abdominal pain 07/26/2015  . Dysphagia   . Schatzki's ring   . Hiatal hernia   . Knee contusion 05/02/2014  . Acquired trigger finger 10/12/2013  . Constipation 07/15/2013  . Abdominal pain, epigastric 07/15/2013  . LLQ pain  07/15/2013  . Lightheaded 01/21/2013  . Elevated LFTs 12/26/2012  . History of benign esophageal tumor   . Adenomatous colon polyp   . [place] DISEASE, LUMBOSACRAL SPINE 05/01/2010  . HYPERLIPIDEMIA 08/22/2009  . THYROID NODULE 08/16/2009  . Anxiety and depression 08/16/2009  . Essential hypertension 08/16/2009  . Gastroesophageal reflux disease 08/16/2009  . FIBROMYALGIA 08/16/2009    Past Surgical History:  Procedure Laterality Date  . ABDOMINAL HYSTERECTOMY    . BRAVO [place] STUDY  12/11/2011   Procedure: BRAVO [place];  Surgeon: Daneil Dolin, MD;  Location: [facility] ENDO SUITE;  Service: Endoscopy;  Laterality: N/A;  . 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 CHOLECYSTECTOMY N/A 04/09/2013   Procedure: LAPAROSCOPIC CHOLECYSTECTOMY;  Surgeon: Jamesetta So, MD;  Location: [facility] ORS;  Service: General;  Laterality: N/A;  . COLONOSCOPY  06/2008, 06/2011   sigmoid tics, tubular adenoma; 2013: anal canal hemorrhoids  . COLONOSCOPY  07/10/2011   Anal canal hemorrhoids likely the cause of hematochezia in the setting of constipation; otherwise normal rectum ;submucosal  petechiae in left colon of doubtful clinical significance; otherwise, normal colon  . ESOPHAGEAL DILATION N/A 05/08/2015  Procedure: ESOPHAGEAL DILATION;  Surgeon: Daneil Dolin, MD;  Location: [facility] ORS;  Service: Endoscopy;  Laterality: N/AVenia Minks 54/56  . ESOPHAGOGASTRODUODENOSCOPY  12/11/2011   [id number] Schatzki's ring; otherwise normal/Small hiatal hernia. Antral and bulbar erosions  . ESOPHAGOGASTRODUODENOSCOPY (EGD) WITH PROPOFOL N/A 05/08/2015   Dr. Gala Romney: mild erosive reflux esophagitis, non-critical Schatzki's ring s/p dilation. Hiatal hernia.   . EUS  08/2010   Dr [facility] Xxx with EGD. Retained food. No recurrent esophageal lesion, bx negative.  Marland Kitchen LUNG BIOPSY     negative  . PARATHYROIDECTOMY  April 2017   [facility].   Marland Kitchen RIGHT OOPHORECTOMY     benign disease  .  SHOULDER ARTHROSCOPY     Right; bone spurs removed  . TONSILLECTOMY    . TOTAL KNEE ARTHROPLASTY  2002   Right; previous arthroscopic surgery     OB History   None      Home Medications    Prior to Admission medications   Medication Sig Start Date End Date Taking? Authorizing Provider  acetaminophen (TYLENOL) 500 MG tablet Take 500 mg by mouth every 8 (eight) hours as needed for mild pain or moderate pain.    [provider]  albuterol (PROVENTIL HFA;VENTOLIN HFA) 108 (90 BASE) MCG/ACT inhaler Inhale 2 puffs into the lungs every 6 (six) hours as needed. For shortness of breath    [provider]  amLODipine (NORVASC) 5 MG tablet Take 10 mg by mouth daily.  12/18/15   [provider]  atenolol (TENORMIN) 100 MG tablet Take 100 mg by mouth daily.    [provider]  CALCIUM-VITAMIN D PO Take by mouth daily.    [provider]  cholecalciferol (VITAMIN D) 1000 units tablet Take 1,000 Units by mouth daily. Reported on 10/24/2015    [provider]  dexlansoprazole (DEXILANT) 60 MG capsule Take 60 mg by mouth daily.    [provider]  fluticasone (FLONASE) 50 MCG/ACT nasal spray Place 1 spray into both nostrils 2 (two) times daily.    [provider]  furosemide (LASIX) 20 MG tablet Take 20 mg daily as needed for leg swelling 12/30/16   Arnoldo Lenis, MD  ibuprofen (ADVIL,MOTRIN) 800 MG tablet Take 800 mg by mouth 3 (three) times daily as needed.    [provider]  loratadine (CLARITIN) 10 MG tablet Take 10 mg by mouth daily. Reported on 12/19/2015    [provider]  LORazepam (ATIVAN) 0.5 MG tablet Take 0.5 mg by mouth 2 (two) times daily.    [provider]  losartan (COZAAR) 25 MG tablet Take 25 mg by mouth daily.    [provider]  lovastatin (MEVACOR) 40 MG tablet Take 40 mg by mouth at bedtime.    [provider]  Melatonin 3 MG TABS Take 6 mg by mouth at bedtime.     [provider]  memantine (NAMENDA) 5 MG tablet Take 5 mg by mouth 2 (two) times daily.    [provider]  metFORMIN (GLUCOPHAGE-XR) 500 MG 24 hr tablet Take 500 mg by mouth daily with breakfast.    [provider]  OLANZapine (ZYPREXA) 2.5 MG tablet Take 2.5 mg by mouth daily as needed.    [provider]  OLANZapine (ZYPREXA) 7.5 MG tablet Take 7.5 mg by mouth at bedtime.    [provider]  pantoprazole (PROTONIX) 40 MG tablet Take 1 tablet (40 mg total) by mouth daily. 01/21/17   [name], NP  spironolactone (ALDACTONE) 25 MG tablet Take 25 mg by mouth daily. 12/18/15   [provider]  topiramate (TOPAMAX) 25 MG capsule Take 25 mg by mouth 3 (three) times daily.    [provider]  venlafaxine XR (EFFEXOR-XR) 150 MG 24 hr capsule Take 150 mg by mouth 2 (two) times daily.    [provider]  venlafaxine XR (EFFEXOR-XR) 75 MG 24 hr capsule Take 75 mg by mouth 2 (two) times daily.    [provider]  Vitamin D, Ergocalciferol, (DRISDOL) 50000 units CAPS capsule Take 50,000 Units by mouth every 7 (seven) days.    [provider]    Family History Family History  Problem Relation Age of Onset  . Stroke Father   . Alcohol abuse Father   . Heart attack Mother   . Depression Mother   . Anxiety disorder Mother   . Colon cancer Paternal Aunt   . Colon cancer Paternal Uncle   . Dementia Maternal Uncle   . ADD / ADHD Neg Hx   . Bipolar disorder Neg Hx   . Drug abuse Neg Hx   . OCD Neg Hx   . Paranoid behavior Neg Hx   . Schizophrenia Neg Hx   . Seizures Neg Hx   . Sexual abuse Neg Hx   . Physical abuse Neg Hx     Social History Social History   Tobacco Use  . Smoking status: Never Smoker  . Smokeless tobacco: Never Used  Substance Use Topics  . Alcohol use: No    Alcohol/week: 0.0 oz  . Drug use: No     Allergies   Elavil [amitriptyline]; Abilify [aripiprazole]; Trazodone and  nefazodone; Codeine; Latex; Penicillin v; Penicillins; Polyethylene glycol; Sulfonamide derivatives; Cymbalta [duloxetine hcl]; and Remeron [mirtazapine]   Review of Systems Review of Systems  All other systems reviewed and are negative.    Physical Exam Updated Vital Signs BP 136/72 (BP Location: Right Arm)   Pulse 69   Temp 98.8 F (37.1 C) (Oral)   Resp 16   Ht 1.575 m (5\' 2" )   Wt 98.9 kg (218 lb)   SpO2 100%   BMI 39.87 kg/m   Physical Exam  Constitutional: She is oriented to person, place, and time. She appears well-developed and well-nourished.  HENT:  Head: Normocephalic.    Eyes: Pupils are equal, round, and reactive to light. EOM are normal.  Neck: Normal range of motion. Neck supple.  Cardiovascular: Normal rate, regular rhythm and normal heart sounds.  Pulmonary/Chest: Effort normal and breath sounds normal.  Abdominal: Soft. Bowel sounds are normal.  Musculoskeletal: Normal range of motion.  Mild tenderness to palpation right lateral hip Full active range of motion of the hip, knee, and ankle. No external signs of trauma  Neurological: She is alert and oriented to person, place, and time.  Skin: Skin is warm and dry. Capillary refill takes less than 2 seconds.  Psychiatric: She has a normal mood and affect.  Nursing note and vitals reviewed.    ED Treatments / Results  Labs (all labs ordered are listed, but only abnormal results are displayed) Labs Reviewed - No data to display  EKG None  Radiology Ct Head Wo Contrast  Result Date: 09/20/2017 CLINICAL DATA:  72 year old female with acute head, face and neck injury from fall today. EXAM: CT HEAD WITHOUT CONTRAST CT MAXILLOFACIAL WITHOUT CONTRAST CT CERVICAL SPINE WITHOUT CONTRAST TECHNIQUE: Multidetector CT imaging of the head, cervical spine, and maxillofacial structures were performed  using the standard protocol without intravenous contrast. Multiplanar CT image reconstructions of the cervical  spine and maxillofacial structures were also generated. COMPARISON:  02/14/2012 MR FINDINGS: CT HEAD FINDINGS Brain: No evidence of acute infarction, hemorrhage, hydrocephalus, extra-axial collection or mass lesion/mass effect. Mild chronic small-vessel white matter ischemic changes again noted. Vascular: Mild atherosclerotic calcifications again noted Skull: Normal. Negative for fracture or focal lesion. Other: None. CT MAXILLOFACIAL FINDINGS Osseous: No fracture or mandibular dislocation. No destructive process. Orbits: No traumatic or inflammatory finding. Exophthalmos again noted. Sinuses: Clear. Soft tissues: Negative. CT CERVICAL SPINE FINDINGS Alignment: Normal. Skull base and vertebrae: No acute fracture. No primary bone lesion or focal pathologic process. Soft tissues and spinal canal: No prevertebral fluid or swelling. No visible canal hematoma. Disc levels: Moderate degenerative disc disease/spondylosis C3-4 and C5-6 noted. This contributes to severe central spinal and mild to moderate foraminal narrowing at C3-4 and mild central spinal and foraminal narrowing at C5-6. Upper chest: No acute abnormality Other: None IMPRESSION: 1. No evidence of acute intracranial abnormality. Mild chronic small-vessel white matter ischemic changes. 2. No evidence of acute facial fracture.  Exophthalmos again noted. 3. No static evidence of acute injury to the cervical spine. Degenerative changes as described. Severe central spinal and mild to moderate foraminal narrowing at C3-4. Electronically Signed   By: Margarette Canada M.D.   On: 09/20/2017 15:40   Ct Cervical Spine Wo Contrast  Result Date: 09/20/2017 CLINICAL DATA:  72 year old female with acute head, face and neck injury from fall today. EXAM: CT HEAD WITHOUT CONTRAST CT MAXILLOFACIAL WITHOUT CONTRAST CT CERVICAL SPINE WITHOUT CONTRAST TECHNIQUE: Multidetector CT imaging of the head, cervical spine, and maxillofacial structures were performed using the standard  protocol without intravenous contrast. Multiplanar CT image reconstructions of the cervical spine and maxillofacial structures were also generated. COMPARISON:  02/14/2012 MR FINDINGS: CT HEAD FINDINGS Brain: No evidence of acute infarction, hemorrhage, hydrocephalus, extra-axial collection or mass lesion/mass effect. Mild chronic small-vessel white matter ischemic changes again noted. Vascular: Mild atherosclerotic calcifications again noted Skull: Normal. Negative for fracture or focal lesion. Other: None. CT MAXILLOFACIAL FINDINGS Osseous: No fracture or mandibular dislocation. No destructive process. Orbits: No traumatic or inflammatory finding. Exophthalmos again noted. Sinuses: Clear. Soft tissues: Negative. CT CERVICAL SPINE FINDINGS Alignment: Normal. Skull base and vertebrae: No acute fracture. No primary bone lesion or focal pathologic process. Soft tissues and spinal canal: No prevertebral fluid or swelling. No visible canal hematoma. Disc levels: Moderate degenerative disc disease/spondylosis C3-4 and C5-6 noted. This contributes to severe central spinal and mild to moderate foraminal narrowing at C3-4 and mild central spinal and foraminal narrowing at C5-6. Upper chest: No acute abnormality Other: None IMPRESSION: 1. No evidence of acute intracranial abnormality. Mild chronic small-vessel white matter ischemic changes. 2. No evidence of acute facial fracture.  Exophthalmos again noted. 3. No static evidence of acute injury to the cervical spine. Degenerative changes as described. Severe central spinal and mild to moderate foraminal narrowing at C3-4. Electronically Signed   By: Margarette Canada M.D.   On: 09/20/2017 15:40   Dg Hip Unilat W Or Wo Pelvis 2-3 Views Right  Result Date: 09/20/2017 CLINICAL DATA:  Fall.  Pain EXAM: DG HIP (WITH OR WITHOUT PELVIS) 2-3V RIGHT COMPARISON:  None FINDINGS: There is no evidence of hip fracture or dislocation. There is no evidence of arthropathy or other focal bone  abnormality. IMPRESSION: 1. No fracture identified. 2. If there is high clinical suspicion for occult fracture or the patient  refuses to weightbear, consider further evaluation with MRI. Although CT is expeditious, evidence is lacking regarding accuracy of CT over plain film radiography. Electronically Signed   By: Kerby Moors M.D.   On: 09/20/2017 15:51   Ct Maxillofacial Wo Contrast  Result Date: 09/20/2017 CLINICAL DATA:  72 year old female with acute head, face and neck injury from fall today. EXAM: CT HEAD WITHOUT CONTRAST CT MAXILLOFACIAL WITHOUT CONTRAST CT CERVICAL SPINE WITHOUT CONTRAST TECHNIQUE: Multidetector CT imaging of the head, cervical spine, and maxillofacial structures were performed using the standard protocol without intravenous contrast. Multiplanar CT image reconstructions of the cervical spine and maxillofacial structures were also generated. COMPARISON:  02/14/2012 MR FINDINGS: CT HEAD FINDINGS Brain: No evidence of acute infarction, hemorrhage, hydrocephalus, extra-axial collection or mass lesion/mass effect. Mild chronic small-vessel white matter ischemic changes again noted. Vascular: Mild atherosclerotic calcifications again noted Skull: Normal. Negative for fracture or focal lesion. Other: None. CT MAXILLOFACIAL FINDINGS Osseous: No fracture or mandibular dislocation. No destructive process. Orbits: No traumatic or inflammatory finding. Exophthalmos again noted. Sinuses: Clear. Soft tissues: Negative. CT CERVICAL SPINE FINDINGS Alignment: Normal. Skull base and vertebrae: No acute fracture. No primary bone lesion or focal pathologic process. Soft tissues and spinal canal: No prevertebral fluid or swelling. No visible canal hematoma. Disc levels: Moderate degenerative disc disease/spondylosis C3-4 and C5-6 noted. This contributes to severe central spinal and mild to moderate foraminal narrowing at C3-4 and mild central spinal and foraminal narrowing at C5-6. Upper chest: No acute  abnormality Other: None IMPRESSION: 1. No evidence of acute intracranial abnormality. Mild chronic small-vessel white matter ischemic changes. 2. No evidence of acute facial fracture.  Exophthalmos again noted. 3. No static evidence of acute injury to the cervical spine. Degenerative changes as described. Severe central spinal and mild to moderate foraminal narrowing at C3-4. Electronically Signed   By: Margarette Canada M.D.   On: 09/20/2017 15:40    Procedures Procedures (including critical care time)  Medications Ordered in ED Medications  HYDROcodone-acetaminophen (NORCO/VICODIN) 5-325 MG per tablet 1 tablet (has no administration in time range)     Initial Impression / Assessment and Plan / ED Course  I have reviewed the triage vital signs and the nursing notes.  Pertinent labs & imaging results that were available during my care of the patient were reviewed by me and considered in my medical decision making (see chart for details).    -72year-old female with mechanical fall today.  CT head, neck, and facial bones without any evidence of fracture or intracranial abnormality.  Plain x-Florence Antonelli of the right hip without evidence of fracture.  I discussed all of the above results with the patient and with her sister, who is her power of attorney.  Her sister is Ms. Barbee and she was contacted via phone. Final Clinical Impressions(s) / ED Diagnoses   Final diagnoses:  Fall, initial encounter  Abrasion of face, initial encounter  Pain of right hip joint    ED Discharge Orders    None       Pattricia Boss, MD 09/20/17 [phone]

## 2017-09-29 ENCOUNTER — Telehealth: Payer: Self-pay | Admitting: *Deleted

## 2017-09-29 ENCOUNTER — Encounter: Payer: Self-pay | Admitting: Gastroenterology

## 2017-09-29 ENCOUNTER — Encounter: Payer: Self-pay | Admitting: *Deleted

## 2017-09-29 ENCOUNTER — Other Ambulatory Visit: Payer: Self-pay | Admitting: *Deleted

## 2017-09-29 ENCOUNTER — Ambulatory Visit (INDEPENDENT_AMBULATORY_CARE_PROVIDER_SITE_OTHER): Payer: Medicare HMO | Admitting: Gastroenterology

## 2017-09-29 VITALS — BP 127/78 | HR 73 | Temp 98.2°F | Ht 62.0 in | Wt 218.8 lb

## 2017-09-29 DIAGNOSIS — Z86010 Personal history of colonic polyps: Secondary | ICD-10-CM | POA: Insufficient documentation

## 2017-09-29 DIAGNOSIS — K219 Gastro-esophageal reflux disease without esophagitis: Secondary | ICD-10-CM | POA: Diagnosis not present

## 2017-09-29 DIAGNOSIS — R1030 Lower abdominal pain, unspecified: Secondary | ICD-10-CM

## 2017-09-29 DIAGNOSIS — K5909 Other constipation: Secondary | ICD-10-CM | POA: Diagnosis not present

## 2017-09-29 MED ORDER — NA SULFATE-K SULFATE-MG SULF 17.5-3.13-1.6 GM/177ML PO SOLN: 1.0000 | ORAL | 0 refills | Status: DC

## 2017-09-29 MED ORDER — LUBIPROSTONE 24 MCG PO CAPS: 24.0000 ug | ORAL_CAPSULE | Freq: Two times a day (BID) | ORAL | 5 refills | Status: DC

## 2017-09-29 MED ORDER — RABEPRAZOLE SODIUM 20 MG PO TBEC: 20.0000 mg | DELAYED_RELEASE_TABLET | Freq: Every day | ORAL | 5 refills | Status: DC

## 2017-09-29 NOTE — Progress Notes (Addendum)
Primary Care Physician:  Andrey Campanile, MD  Primary Gastroenterologist:  Garfield Cornea, MD   Chief Complaint  Patient presents with  . Abdominal Pain    lower mid abd; needs Protonix    HPI:  [name] is a 72 y.o. female here because she needed further refills and was overdue for colonoscopy for history of adenomatous colon polyps with last colonoscopy in 2013..  We last saw her in November 2017.  She has a history of GERD and constipation, adenomatous colon polyps.  She was due for surveillance colonoscopy last year but due to illness this was postponed.  States she was hospitalized at [facility] last year and required skilled nursing facility stay, combined time of 4 months due to mental status changes.  She really did not elaborate on the etiology but states that she has returned to near baseline.  For the past 1 month she has had some intermittent lower abdominal pain. Nothing makes better or worse. Complains of constipation. Miralax not helping. Senna not helping. Metamucil/benefiber not helping. BM once every 2 weeks. No brbpr. No melena. Some heartburn every day. Even on protonix. Tums in addition. No dysphagia.  When she went home from [place] last year she states she was given another medication to take at bedtime along with her pantoprazole during the day which seemed to really help but she does not recall the name.  Previously failed Prilosec, Nexium, Dexilant.  She denies any vomiting, dysphagia.  Patient had mechanical fall last month. ED records reviewed. CT head, neck, maxillofacial reviewed with no acute injury.       Current Outpatient Medications  Medication Sig Dispense Refill  . acetaminophen (TYLENOL) 500 MG tablet Take 500 mg by mouth every 8 (eight) hours as needed for mild pain or moderate pain.    Marland Kitchen albuterol (PROVENTIL HFA;VENTOLIN HFA) 108 (90 BASE) MCG/ACT inhaler Inhale 2 puffs into the lungs every 6 (six) hours as needed. For shortness of breath    . amLODipine  (NORVASC) 10 MG tablet Take 10 mg by mouth daily.     Marland Kitchen atenolol (TENORMIN) 100 MG tablet Take 100 mg by mouth daily.    . fluticasone (FLONASE) 50 MCG/ACT nasal spray Place 1 spray into both nostrils daily as needed for allergies.     . furosemide (LASIX) 20 MG tablet Take 20 mg daily as needed for leg swelling (Patient taking differently: Take 20 mg by mouth daily as needed for fluid or edema. ) 90 tablet 3  . loratadine (CLARITIN) 10 MG tablet Take 10 mg by mouth daily. Reported on 12/19/2015    . LORazepam (ATIVAN) 0.5 MG tablet Take 0.5 mg by mouth 2 (two) times daily.    Marland Kitchen lovastatin (MEVACOR) 40 MG tablet Take 40 mg by mouth at bedtime.    . Melatonin 3 MG TABS Take 6 mg by mouth at bedtime.    . memantine (NAMENDA) 5 MG tablet Take 5 mg by mouth 2 (two) times daily.    . metFORMIN (GLUCOPHAGE-XR) 500 MG 24 hr tablet Take 500 mg by mouth daily with breakfast.    . OLANZapine (ZYPREXA) 2.5 MG tablet Take 2.5 mg by mouth daily as needed (for depression).     . OLANZapine (ZYPREXA) 7.5 MG tablet Take 7.5 mg by mouth at bedtime.    . pantoprazole (PROTONIX) 40 MG tablet Take 1 tablet (40 mg total) by mouth daily. 90 tablet 3  . spironolactone (ALDACTONE) 25 MG tablet Take 25 mg by mouth  daily.    . topiramate (TOPAMAX) 100 MG tablet Take 100 mg by mouth daily.     . traZODone (DESYREL) 100 MG tablet Take 300 mg by mouth at bedtime.  3  . venlafaxine XR (EFFEXOR-XR) 150 MG 24 hr capsule Take 150 mg by mouth 2 (two) times daily.    Marland Kitchen venlafaxine XR (EFFEXOR-XR) 75 MG 24 hr capsule Take 75 mg by mouth 2 (two) times daily.    . Vitamin D, Ergocalciferol, (DRISDOL) 50000 units CAPS capsule Take 50,000 Units by mouth every Wednesday.      No current facility-administered medications for this visit.     Allergies as of 09/29/2017 - Review Complete 09/29/2017  Allergen Reaction Noted  . Elavil [amitriptyline] Other (See Comments) 06/03/2012  . Abilify [aripiprazole] Other (See Comments)  11/25/2012  . Trazodone and nefazodone Other (See Comments) 08/31/2012  . Codeine Hives, Nausea Only, and Other (See Comments)   . Latex Hives 11/21/2011  . Penicillin v Itching 08/02/2013  . Penicillins Hives   . Polyethylene glycol Other (See Comments) 12/26/2012  . Sulfonamide derivatives Nausea And Vomiting   . Cymbalta [duloxetine hcl] Rash 03/10/2013  . Remeron [mirtazapine] Other (See Comments) 05/06/2012    Past Medical History:  Diagnosis Date  . Adenomatous colon polyp 2006   excised in 2006 & 2010Due surveillance 06/2013  . Anxiety   . Anxiety and depression   . Asthma   . Breast tumor   . Chest discomfort   . Chronic back pain   . Chronic constipation   . Complication of anesthesia   . Depression   . Diverticulosis   . Fibromyalgia   . Gastroesophageal reflux disease   . Hiatal hernia   . History of benign esophageal tumor 2010   granular cell esophageal tumor (Dx 06/2008), resected via EMR 2011, due repeat EGD 02/2012  . Hyperlipidemia   . Hypertension   . Insomnia   . Migraines   . PONV (postoperative nausea and vomiting)   . Psychosis ([place])   . Sleep apnea    Stop Bang score of 5. Pt said she was told by Dr. Merlene Laughter that she had "a little bit" of sleep apena, but not bad enough to treat.  . Thyroid nodule   . Tumor of esophagus     Past Surgical History:  Procedure Laterality Date  . ABDOMINAL HYSTERECTOMY    . BRAVO [place] STUDY  12/11/2011   Procedure: BRAVO [place];  Surgeon: Daneil Dolin, MD;  Location: [facility] END[facility] SUITE;  Service: Endoscopy;  Laterality: N/A;  . 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 CHOLECYSTECTOMY N/A 04/09/2013   Procedure: LAPAROSCOPIC CHOLECYSTECTOMY;  Surgeon: Jamesetta So, MD;  Location: [facility] ORS;  Service: General;  Laterality: N/A;  . COLONOSCOPY  06/2008, 06/2011   sigmoid tics, tubular adenoma; 2013: anal canal hemorrhoids  . COLONOSCOPY  07/10/2011   Anal canal hemorrhoids likely  the cause of hematochezia in the setting of constipation; otherwise normal rectum ;submucosal  petechiae in left colon of doubtful clinical significance; otherwise, normal colon  . ESOPHAGEAL DILATION N/A 05/08/2015   Procedure: ESOPHAGEAL DILATION;  Surgeon: Daneil Dolin, MD;  Location: [facility] ORS;  Service: Endoscopy;  Laterality: N/AVenia Minks 54/56  . ESOPHAGOGASTRODUODENOSCOPY  12/11/2011   [id number] Schatzki's ring; otherwise normal/Small hiatal hernia. Antral and bulbar erosions  . ESOPHAGOGASTRODUODENOSCOPY (EGD) WITH PROPOFOL N/A 05/08/2015   Dr. Gala Romney: mild erosive reflux esophagitis, non-critical Schatzki's ring s/p  dilation. Hiatal hernia.   . EUS  08/2010   Dr [facility] with EGD. Retained food. No recurrent esophageal lesion, bx negative.  Marland Kitchen LUNG BIOPSY     negative  . PARATHYROIDECTOMY  April 2017   [facility].   Marland Kitchen RIGHT OOPHORECTOMY     benign disease  . SHOULDER ARTHROSCOPY     Right; bone spurs removed  . TONSILLECTOMY    . TOTAL KNEE ARTHROPLASTY  2002   Right; previous arthroscopic surgery    Family History  Problem Relation Age of Onset  . Stroke Father   . Alcohol abuse Father   . Heart attack Mother   . Depression Mother   . Anxiety disorder Mother   . Colon cancer Paternal Aunt   . Colon cancer Paternal Uncle   . Dementia Maternal Uncle   . ADD / ADHD Neg Hx   . Bipolar disorder Neg Hx   . Drug abuse Neg Hx   . OCD Neg Hx   . Paranoid behavior Neg Hx   . Schizophrenia Neg Hx   . Seizures Neg Hx   . Sexual abuse Neg Hx   . Physical abuse Neg Hx     Social History   Socioeconomic History  . Marital status: Single    Spouse name: Not on file  . Number of children: 1  . Years of education: Not on file  . Highest education level: Not on file  Occupational History  . Occupation: disabled    Employer: RETIRED  Social Needs  . Financial resource strain: Not on file  . Food insecurity:    Worry: Not on file    Inability: Not on file  .  Transportation needs:    Medical: Not on file    Non-medical: Not on file  Tobacco Use  . Smoking status: Never Smoker  . Smokeless tobacco: Never Used  Substance and Sexual Activity  . Alcohol use: No    Alcohol/week: 0.0 oz  . Drug use: No  . Sexual activity: Never  Lifestyle  . Physical activity:    Days per week: Not on file    Minutes per session: Not on file  . Stress: Not on file  Relationships  . Social connections:    Talks on phone: Not on file    Gets together: Not on file    Attends religious service: Not on file    Active member of club or organization: Not on file    Attends meetings of clubs or organizations: Not on file    Relationship status: Not on file  . Intimate partner violence:    Fear of current or ex partner: Not on file    Emotionally abused: Not on file    Physically abused: Not on file    Forced sexual activity: Not on file  Other Topics Concern  . Not on file  Social History Narrative  . Not on file      ROS:  General: Negative for anorexia, weight loss, fever, chills, fatigue, weakness. Eyes: Negative for vision changes.  ENT: Negative for hoarseness, difficulty swallowing , nasal congestion. CV: Negative for chest pain, angina, palpitations, dyspnea on exertion, peripheral edema.  Respiratory: Negative for dyspnea at rest, dyspnea on exertion, cough, sputum, wheezing.  GI: See history of present illness. GU:  Negative for dysuria, hematuria, urinary incontinence, urinary frequency, nocturnal urination.  MS: Negative for joint pain, low back pain.  Derm: Negative for rash or itching.  Neuro: Negative for weakness, abnormal sensation,  seizure, frequent headaches, memory loss, confusion.  Psych: Negative for anxiety, depression, suicidal ideation, hallucinations.  Endo: Negative for unusual weight change.  Heme: Negative for bruising or bleeding. Allergy: Negative for rash or hives.    Physical Examination:  BP 127/78   Pulse 73    Temp 98.2 F (36.8 C) (Oral)   Ht 5\' 2"  (1.575 m)   Wt 218 lb 12.8 oz (99.2 kg)   BMI 40.02 kg/m    General: Well-nourished, well-developed in no acute distress.  Accompanied by daughter. Head: Normocephalic, atraumatic.   Eyes: Conjunctiva pink, no icterus. Mouth: Oropharyngeal mucosa moist and pink , no lesions erythema or exudate. Neck: Supple without thyromegaly, masses, or lymphadenopathy.  Lungs: Clear to auscultation bilaterally.  Heart: Regular rate and rhythm, no murmurs rubs or gallops.  Abdomen: Bowel sounds are normal, nontender, nondistended, no hepatosplenomegaly or masses, no abdominal bruits or    hernia , no rebound or guarding.   Rectal: Not performed Extremities: No lower extremity edema. No clubbing or deformities.  Neuro: Alert and oriented x 4 , grossly normal neurologically.  Skin: Warm and dry, no rash or jaundice.   Psych: Alert and cooperative, normal mood and affect.  Labs: Labs done at [facility] in February 2018 with normal hemoglobin of 12.7, hematocrit 38.5, platelets 164,000.  Total bilirubin 0.7, alkaline phosphatase 50, albumin 3.6, AST 18, ALT 19, creatinine 0.8, BUN 16

## 2017-09-29 NOTE — Assessment & Plan Note (Signed)
Due for surveillance colonoscopy.  Plan for deep sedation with propofol given polypharmacy.  I have discussed the risks, alternatives, benefits with regards to but not limited to the risk of reaction to medication, bleeding, infection, perforation and the patient is agreeable to proceed. Written consent to be obtained.  Discussed at length with patient and daughter of the importance of correcting her constipation prior to colon prep.  If Amitiza is not tolerated or does not work she is to let me know when we can make further recommendations.

## 2017-09-29 NOTE — Assessment & Plan Note (Signed)
Really not moving her bowels very well which is likely contributing to her lower abdominal pain.  Initially would recommend starting medication for constipation and see if her abdominal pain resolves.  He has failed multiple over-the-counter agents including Benefiber/Metamucil/MiraLAX/senna.  Past she has tried Linzess, all doses cause diarrhea.  Some possible intolerance to Amitiza reportedly with stomachache or cramps patient really does not remember at this point.  Retry Amitiza given good insurance coverage.  If she has any intolerances, would consider trulance as a next option.

## 2017-09-29 NOTE — Telephone Encounter (Signed)
Called spoke with pt and is aware pre-op scheduled for 11/04/17 at 12:45pm. Letter mailed with appt information

## 2017-09-29 NOTE — Patient Instructions (Signed)
1. Stop pantoprazole. Start Aciphex once daily before breakfast for acid reflux.  Please let me know if her symptoms are not better controlled. 2. Start Amitiza 24 mcg 1-2 times daily for constipation.   3. Colonoscopy as scheduled.  Please see separate instructions.

## 2017-09-29 NOTE — Progress Notes (Signed)
cc'ed to pcp °

## 2017-09-29 NOTE — Assessment & Plan Note (Signed)
Not completely controlled on current regimen.  Switch to AcipHex 20 mg daily.  She will let me know how her reflux does on this.  Previously did well with what sounds like H2 blocker at nighttime in addition to her pantoprazole, could go back to that regimen if needed based on response to AcipHex.

## 2017-10-07 ENCOUNTER — Telehealth: Payer: Self-pay

## 2017-10-07 NOTE — Telephone Encounter (Signed)
Received a call from [place], requesting a refill on Aciphex. Medication was sent to [facility] on 09/29/17 by [facility]. Gave a verbal rx over the phone, Aciphex 20mg  one tab po before breakfast #90 w 1 rf.

## 2017-10-07 NOTE — Telephone Encounter (Signed)
Opened in error

## 2017-10-30 NOTE — Patient Instructions (Signed)
[name]  10/30/2017     @PREFPERIOPPHARMACY @   Your procedure is scheduled on  11/10/2017   Report to [facility] at  83   A.M.  Call this number if you have problems the morning of surgery:  [phone]   Remember:  Do not eat food or drink liquids after midnight.  Take these medicines the morning of surgery with A SIP OF WATER  Amlodipine, atenolol, hydrocodone, claritin, ativan, namenda, zyprexa, aciphex, topamax,effexor. Use your inhaler before you come.   Do not wear jewelry, make-up or nail polish.  Do not wear lotions, powders, or perfumes, or deodorant.  Do not shave 48 hours prior to surgery.  Men may shave face and neck.  Do not bring valuables to the hospital.  [facility] is not responsible for any belongings or valuables.  Contacts, dentures or bridgework may not be worn into surgery.  Leave your suitcase in the car.  After surgery it may be brought to your room.  For patients admitted to the hospital, discharge time will be determined by your treatment team.  Patients discharged the day of surgery will not be allowed to drive home.   Name and phone number of your driver:   family Special instructions:  Follow the diet and prep instructions given to you by Dr Roseanne Kaufman office.  Please read over the following fact sheets that you were given. Anesthesia Post-op Instructions and Care and Recovery After Surgery       Colonoscopy, Adult A colonoscopy is an exam to look at the large intestine. It is done to check for problems, such as:  Lumps (tumors).  Growths (polyps).  Swelling (inflammation).  Bleeding.  What happens before the procedure? Eating and drinking Follow instructions from your doctor about eating and drinking. These instructions may include:  A few days before the procedure - follow a low-fiber diet. ? Avoid nuts. ? Avoid seeds. ? Avoid dried fruit. ? Avoid raw fruits. ? Avoid vegetables.  1-3 days before the  procedure - follow a clear liquid diet. Avoid liquids that have red or purple dye. Drink only clear liquids, such as: ? Clear broth or bouillon. ? Black coffee or tea. ? Clear juice. ? Clear soft drinks or sports drinks. ? Gelatin dessert. ? Popsicles.  On the day of the procedure - do not eat or drink anything during the 2 hours before the procedure.  Bowel prep If you were prescribed an oral bowel prep:  Take it as told by your doctor. Starting the day before your procedure, you will need to drink a lot of liquid. The liquid will cause you to poop (have bowel movements) until your poop is almost clear or light green.  If your skin or butt gets irritated from diarrhea, you may: ? Wipe the area with wipes that have medicine in them, such as adult wet wipes with aloe and vitamin E. ? Put something on your skin that soothes the area, such as petroleum jelly.  If you throw up (vomit) while drinking the bowel prep, take a break for up to 60 minutes. Then begin the bowel prep again. If you keep throwing up and you cannot take the bowel prep without throwing up, call your doctor.  General instructions  Ask your doctor about changing or stopping your normal medicines. This is important if you take diabetes medicines or blood thinners.  Plan to have someone take you home from the  hospital or clinic. What happens during the procedure?  An IV tube may be put into one of your veins.  You will be given medicine to help you relax (sedative).  To reduce your risk of infection: ? Your doctors will wash their hands. ? Your anal area will be washed with soap.  You will be asked to lie on your side with your knees bent.  Your doctor will get a long, thin, flexible tube ready. The tube will have a camera and a light on the end.  The tube will be put into your anus.  The tube will be gently put into your large intestine.  Air will be delivered into your large intestine to keep it open. You  may feel some pressure or cramping.  The camera will be used to take photos.  A small tissue sample may be removed from your body to be looked at under a microscope (biopsy). If any possible problems are found, the tissue will be sent to a lab for testing.  If small growths are found, your doctor may remove them and have them checked for cancer.  The tube that was put into your anus will be slowly removed. The procedure may vary among doctors and hospitals. What happens after the procedure?  Your doctor will check on you often until the medicines you were given have worn off.  Do not drive for 24 hours after the procedure.  You may have a small amount of blood in your poop.  You may pass gas.  You may have mild cramps or bloating in your belly (abdomen).  It is up to you to get the results of your procedure. Ask your doctor, or the department performing the procedure, when your results will be ready. This information is not intended to replace advice given to you by your health care provider. Make sure you discuss any questions you have with your health care provider. Document Released: 07/13/2010 Document Revised: 04/10/2016 Document Reviewed: 08/22/2015 Elsevier Interactive Patient Education  2017 [employer]  Colonoscopy, Adult, Care After This sheet gives you information about how to care for yourself after your procedure. Your health care provider may also give you more specific instructions. If you have problems or questions, contact your health care provider. What can I expect after the procedure? After the procedure, it is common to have:  A small amount of blood in your stool for 24 hours after the procedure.  Some gas.  Mild abdominal cramping or bloating.  Follow these instructions at home: General instructions   For the first 24 hours after the procedure: ? Do not drive or use machinery. ? Do not sign important documents. ? Do not drink alcohol. ? Do your  regular daily activities at a slower pace than normal. ? Eat soft, easy-to-digest foods. ? Rest often.  Take over-the-counter or prescription medicines only as told by your health care provider.  It is up to you to get the results of your procedure. Ask your health care provider, or the department performing the procedure, when your results will be ready. Relieving cramping and bloating  Try walking around when you have cramps or feel bloated.  Apply heat to your abdomen as told by your health care provider. Use a heat source that your health care provider recommends, such as a moist heat pack or a heating pad. ? Place a towel between your skin and the heat source. ? Leave the heat on for 20-30 minutes. ? Remove the  heat if your skin turns bright red. This is especially important if you are unable to feel pain, heat, or cold. You may have a greater risk of getting burned. Eating and drinking  Drink enough fluid to keep your urine clear or pale yellow.  Resume your normal diet as instructed by your health care provider. Avoid heavy or fried foods that are hard to digest.  Avoid drinking alcohol for as long as instructed by your health care provider. Contact a health care provider if:  You have blood in your stool 2-3 days after the procedure. Get help right away if:  You have more than a small spotting of blood in your stool.  You pass large blood clots in your stool.  Your abdomen is swollen.  You have nausea or vomiting.  You have a fever.  You have increasing abdominal pain that is not relieved with medicine. This information is not intended to replace advice given to you by your health care provider. Make sure you discuss any questions you have with your health care provider. Document Released: 01/23/2004 Document Revised: 03/04/2016 Document Reviewed: 08/22/2015 Elsevier Interactive Patient Education  2018 [place] Anesthesia is a term  that refers to techniques, procedures, and medicines that help a person stay safe and comfortable during a medical procedure. Monitored anesthesia care, or sedation, is one type of anesthesia. Your anesthesia specialist may recommend sedation if you will be having a procedure that does not require you to be unconscious, such as:  Cataract surgery.  A dental procedure.  A biopsy.  A colonoscopy.  During the procedure, you may receive a medicine to help you relax (sedative). There are three levels of sedation:  Mild sedation. At this level, you may feel awake and relaxed. You will be able to follow directions.  Moderate sedation. At this level, you will be sleepy. You may not remember the procedure.  Deep sedation. At this level, you will be asleep. You will not remember the procedure.  The more medicine you are given, the deeper your level of sedation will be. Depending on how you respond to the procedure, the anesthesia specialist may change your level of sedation or the type of anesthesia to fit your needs. An anesthesia specialist will monitor you closely during the procedure. Let your health care provider know about:  Any allergies you have.  All medicines you are taking, including vitamins, herbs, eye drops, creams, and over-the-counter medicines.  Any use of steroids (by mouth or as a cream).  Any problems you or family members have had with sedatives and anesthetic medicines.  Any blood disorders you have.  Any surgeries you have had.  Any medical conditions you have, such as sleep apnea.  Whether you are pregnant or may be pregnant.  Any use of cigarettes, alcohol, or street drugs. What are the risks? Generally, this is a safe procedure. However, problems may occur, including:  Getting too much medicine (oversedation).  Nausea.  Allergic reaction to medicines.  Trouble breathing. If this happens, a breathing tube may be used to help with breathing. It will be  removed when you are awake and breathing on your own.  Heart trouble.  Lung trouble.  Before the procedure Staying hydrated Follow instructions from your health care provider about hydration, which may include:  Up to 2 hours before the procedure - you may continue to drink clear liquids, such as water, clear fruit juice, black coffee, and plain tea.  Eating and  drinking restrictions Follow instructions from your health care provider about eating and drinking, which may include:  8 hours before the procedure - stop eating heavy meals or foods such as meat, fried foods, or fatty foods.  6 hours before the procedure - stop eating light meals or foods, such as toast or cereal.  6 hours before the procedure - stop drinking milk or drinks that contain milk.  2 hours before the procedure - stop drinking clear liquids.  Medicines Ask your health care provider about:  Changing or stopping your regular medicines. This is especially important if you are taking diabetes medicines or blood thinners.  Taking medicines such as aspirin and ibuprofen. These medicines can thin your blood. Do not take these medicines before your procedure if your health care provider instructs you not to.  Tests and exams  You will have a physical exam.  You may have blood tests done to show: ? How well your kidneys and liver are working. ? How well your blood can clot.  General instructions  Plan to have someone take you home from the hospital or clinic.  If you will be going home right after the procedure, plan to have someone with you for 24 hours.  What happens during the procedure?  Your blood pressure, heart rate, breathing, level of pain and overall condition will be monitored.  An IV tube will be inserted into one of your veins.  Your anesthesia specialist will give you medicines as needed to keep you comfortable during the procedure. This may mean changing the level of sedation.  The  procedure will be performed. After the procedure  Your blood pressure, heart rate, breathing rate, and blood oxygen level will be monitored until the medicines you were given have worn off.  Do not drive for 24 hours if you received a sedative.  You may: ? Feel sleepy, clumsy, or nauseous. ? Feel forgetful about what happened after the procedure. ? Have a sore throat if you had a breathing tube during the procedure. ? Vomit. This information is not intended to replace advice given to you by your health care provider. Make sure you discuss any questions you have with your health care provider. Document Released: 03/06/2005 Document Revised: 11/17/2015 Document Reviewed: 10/01/2015 Elsevier Interactive Patient Education  2018 [place], Care After These instructions provide you with information about caring for yourself after your procedure. Your health care provider may also give you more specific instructions. Your treatment has been planned according to current medical practices, but problems sometimes occur. Call your health care provider if you have any problems or questions after your procedure. What can I expect after the procedure? After your procedure, it is common to:  Feel sleepy for several hours.  Feel clumsy and have poor balance for several hours.  Feel forgetful about what happened after the procedure.  Have poor judgment for several hours.  Feel nauseous or vomit.  Have a sore throat if you had a breathing tube during the procedure.  Follow these instructions at home: For at least 24 hours after the procedure:   Do not: ? Participate in activities in which you could fall or become injured. ? Drive. ? Use heavy machinery. ? Drink alcohol. ? Take sleeping pills or medicines that cause drowsiness. ? Make important decisions or sign legal documents. ? Take care of children on your own.  Rest. Eating and drinking  Follow the  diet that is recommended by your health  care provider.  If you vomit, drink water, juice, or soup when you can drink without vomiting.  Make sure you have little or no nausea before eating solid foods. General instructions  Have a responsible adult stay with you until you are awake and alert.  Take over-the-counter and prescription medicines only as told by your health care provider.  If you smoke, do not smoke without supervision.  Keep all follow-up visits as told by your health care provider. This is important. Contact a health care provider if:  You keep feeling nauseous or you keep vomiting.  You feel light-headed.  You develop a rash.  You have a fever. Get help right away if:  You have trouble breathing. This information is not intended to replace advice given to you by your health care provider. Make sure you discuss any questions you have with your health care provider. Document Released: 10/01/2015 Document Revised: 01/31/2016 Document Reviewed: 10/01/2015 Elsevier Interactive Patient Education  [employer].

## 2017-11-04 ENCOUNTER — Other Ambulatory Visit: Payer: Self-pay

## 2017-11-04 ENCOUNTER — Encounter (HOSPITAL_COMMUNITY)
Admission: RE | Admit: 2017-11-04 | Discharge: 2017-11-04 | Disposition: A | Discharge status: Home / Self Care | Payer: Medicare HMO | Source: Ambulatory Visit | Attending: Internal Medicine | Admitting: Internal Medicine

## 2017-11-04 ENCOUNTER — Encounter (HOSPITAL_COMMUNITY): Payer: Self-pay

## 2017-11-04 DIAGNOSIS — Z01812 Encounter for preprocedural laboratory examination: Secondary | ICD-10-CM | POA: Insufficient documentation

## 2017-11-04 LAB — CBC
HCT: 38.6 % (ref 36.0–46.0)
Hemoglobin: 11.9 g/dL — ABNORMAL LOW (ref 12.0–15.0)
MCH: 24.6 pg — ABNORMAL LOW (ref 26.0–34.0)
MCHC: 30.8 g/dL (ref 30.0–36.0)
MCV: 79.8 fL (ref 78.0–100.0)
Platelets: 200 10*3/uL (ref 150–400)
RBC: 4.84 MIL/uL (ref 3.87–5.11)
RDW: 17.8 % — ABNORMAL HIGH (ref 11.5–15.5)
WBC: 5.3 10*3/uL (ref 4.0–10.5)

## 2017-11-04 LAB — BASIC METABOLIC PANEL
Anion gap: 8 (ref 5–15)
BUN: 8 mg/dL (ref 6–20)
CO2: 21 mmol/L — ABNORMAL LOW (ref 22–32)
Calcium: 9.1 mg/dL (ref 8.9–10.3)
Chloride: 112 mmol/L — ABNORMAL HIGH (ref 101–111)
Creatinine, Ser: 1.08 mg/dL — ABNORMAL HIGH (ref 0.44–1.00)
GFR calc Af Amer: 58 mL/min — ABNORMAL LOW (ref >60)
GFR calc non Af Amer: 50 mL/min — ABNORMAL LOW (ref >60)
Glucose, Bld: 90 mg/dL (ref 65–99)
Potassium: 3.5 mmol/L (ref 3.5–5.1)
Sodium: 141 mmol/L (ref 135–145)

## 2017-11-07 ENCOUNTER — Ambulatory Visit: Payer: Self-pay | Admitting: Cardiology

## 2017-11-10 ENCOUNTER — Ambulatory Visit (HOSPITAL_COMMUNITY)
Admission: RE | Admit: 2017-11-10 | Discharge: 2017-11-10 | Disposition: A | Discharge status: Home / Self Care | Payer: Medicare HMO | Source: Ambulatory Visit | Attending: Internal Medicine | Admitting: Internal Medicine

## 2017-11-10 ENCOUNTER — Encounter (HOSPITAL_COMMUNITY)
Admission: RE | Disposition: A | Discharge status: Home / Self Care | Payer: Self-pay | Source: Ambulatory Visit | Attending: Internal Medicine

## 2017-11-10 ENCOUNTER — Encounter (HOSPITAL_COMMUNITY): Payer: Self-pay | Admitting: Anesthesiology

## 2017-11-10 ENCOUNTER — Encounter (HOSPITAL_COMMUNITY): Payer: Self-pay | Admitting: *Deleted

## 2017-11-10 ENCOUNTER — Telehealth: Payer: Self-pay

## 2017-11-10 DIAGNOSIS — Z86010 Personal history of colonic polyps: Secondary | ICD-10-CM | POA: Insufficient documentation

## 2017-11-10 DIAGNOSIS — Z5309 Procedure and treatment not carried out because of other contraindication: Secondary | ICD-10-CM | POA: Insufficient documentation

## 2017-11-10 DIAGNOSIS — Z1211 Encounter for screening for malignant neoplasm of colon: Secondary | ICD-10-CM | POA: Insufficient documentation

## 2017-11-10 HISTORY — PX: COLONOSCOPY WITH PROPOFOL: SHX5780

## 2017-11-10 LAB — GLUCOSE, CAPILLARY: Glucose-Capillary: 103 mg/dL — ABNORMAL HIGH (ref 65–99)

## 2017-11-10 SURGERY — COLONOSCOPY WITH PROPOFOL
Anesthesia: Monitor Anesthesia Care

## 2017-11-10 MED ORDER — CHLORHEXIDINE GLUCONATE CLOTH 2 % EX PADS: 6.0000 | MEDICATED_PAD | Freq: Once | CUTANEOUS | Status: DC

## 2017-11-10 NOTE — Telephone Encounter (Signed)
Rourk, Cristopher Estimable, MD  Inge Rise, CMA  Cc: Sofie Rower M        She just needs to return to the office for a follow-up appointment at this point.

## 2017-11-10 NOTE — OR Nursing (Signed)
Procedure cancelled per Dr. Gala Romney  . Patient continues to have form stool ,  Spoke with  Almyra Free at office. She is aware of cancellation.

## 2017-11-10 NOTE — Telephone Encounter (Signed)
Rourk, Cristopher Estimable, MD  Inge Rise, CMA        Clear liquids for 2 whole days before the procedure. GoLYTELY 2 L in the morning the day before the procedure and 2 L the afternoon before the procedure; (2) enemas the morning of the procedure.   I called patient to let her know and she said she has had this prep in the past and did not tolerate it at all. She threw half of it up. She stated she knows she won't be able to do this prep again. I have sent another message to [facility].

## 2017-11-10 NOTE — Telephone Encounter (Signed)
Spoke with patient and she is r/s'd to 01/01/18 at 10:15am. Patient wanted to know if she needs another type of prep. I advised patient I will ask [facility] to see and I will let her know.

## 2017-11-10 NOTE — Telephone Encounter (Signed)
Barbara from endo called- they have cancelled pt and she needs to be re-scheduled- she was still having formed stool this morning.

## 2017-11-10 NOTE — Telephone Encounter (Signed)
Spoke with patient and she is coming in for OV on 11/18/17 at 2pm with AB.

## 2017-11-10 NOTE — Progress Notes (Signed)
Pt. States still having formed stool.  Dr. Gala Romney informed- he is cancelling procedure.  Office notified to rescedule

## 2017-11-11 ENCOUNTER — Encounter (HOSPITAL_COMMUNITY): Payer: Self-pay | Admitting: Internal Medicine

## 2017-11-18 ENCOUNTER — Encounter: Payer: Self-pay | Admitting: Gastroenterology

## 2017-11-18 ENCOUNTER — Encounter: Payer: Self-pay | Admitting: Internal Medicine

## 2017-11-18 ENCOUNTER — Ambulatory Visit: Payer: Medicare HMO | Admitting: Gastroenterology

## 2017-11-18 ENCOUNTER — Ambulatory Visit (HOSPITAL_COMMUNITY)
Admission: RE | Admit: 2017-11-18 | Discharge: 2017-11-18 | Disposition: A | Discharge status: Home / Self Care | Payer: Medicare HMO | Source: Ambulatory Visit | Attending: Gastroenterology | Admitting: Gastroenterology

## 2017-11-18 VITALS — BP 134/82 | HR 79 | Temp 97.7°F | Ht 62.0 in | Wt 212.0 lb

## 2017-11-18 DIAGNOSIS — K5909 Other constipation: Secondary | ICD-10-CM | POA: Diagnosis not present

## 2017-11-18 MED ORDER — HYDROCORTISONE 2.5 % RE CREA: 1.0000 "application " | TOPICAL_CREAM | Freq: Two times a day (BID) | RECTAL | 1 refills | Status: DC

## 2017-11-18 NOTE — Progress Notes (Signed)
Referring Provider: Andrey Campanile, MD Primary Care Physician:  Andrey Campanile, MD  Primary GI: Dr. Gala Romney   Chief Complaint  Patient presents with  . Follow-up    r/s TCS. Previous poor prep    HPI:   [name] is a 72 y.o. female presenting today with a history of adenomatous colon polyps in the past with last colonoscopy in 2013. History of GERD and constipation. Linzess caused diarrhea in the past. Prescribed Amitiza 24 mcg BID at last appt. Presented for colonoscopy May 2019 but was having formed stool day of procedure. Here to reschedule and discuss prep.    States Amitiza did not work. States she took it for 2 weeks and didn't have a BM. Doesn't remember last time she had a BM. States she has hard BMs that "come down" and stick in rectum like little pebbles. States miralax is not helpful. No rectal bleeding. Hydrocodone one a day. States during bowel prep only liquid came out at first, then soft stool. She has some rectal discomfort with bowel movements at times.   Past Medical History:  Diagnosis Date  . Adenomatous colon polyp 2006   excised in 2006 & 2010Due surveillance 06/2013  . Anxiety   . Anxiety and depression   . Asthma   . Breast tumor   . Chest discomfort   . Chronic back pain   . Chronic constipation   . Complication of anesthesia   . Depression   . Diverticulosis   . Fibromyalgia   . Gastroesophageal reflux disease   . Hiatal hernia   . History of benign esophageal tumor 2010   granular cell esophageal tumor (Dx 06/2008), resected via EMR 2011, due repeat EGD 02/2012  . Hyperlipidemia   . Hypertension   . Insomnia   . Migraines   . PONV (postoperative nausea and vomiting)   . Psychosis ([place])   . Sleep apnea    Stop Bang score of 5. Pt said she was told by Dr. Merlene Laughter that she had "a little bit" of sleep apena, but not bad enough to treat.  . Thyroid nodule   . Tumor of esophagus     Past Surgical History:  Procedure Laterality  Date  . ABDOMINAL HYSTERECTOMY    . BRAVO [place] STUDY  12/11/2011   Procedure: BRAVO [place];  Surgeon: Daneil Dolin, MD;  [facility];  Service: Endoscopy;  Laterality: N/A;  . 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 CHOLECYSTECTOMY N/A 04/09/2013   Procedure: LAPAROSCOPIC CHOLECYSTECTOMY;  Surgeon: Jamesetta So, MD;  Location: [facility];  Service: General;  Laterality: N/A;  . COLONOSCOPY  06/2008, 06/2011   sigmoid tics, tubular adenoma; 2013: anal canal hemorrhoids  . COLONOSCOPY  07/10/2011   Anal canal hemorrhoids likely the cause of hematochezia in the setting of constipation; otherwise normal rectum ;submucosal  petechiae in left colon of doubtful clinical significance; otherwise, normal colon  . COLONOSCOPY WITH PROPOFOL N/A 11/10/2017   Procedure: COLONOSCOPY WITH PROPOFOL  cancelled in Pre OP;  Surgeon: Daneil Dolin, MD;  Location[facility];  Service: Endoscopy;  Laterality: N/A;  9:45am  . ESOPHAGEAL DILATION N/A 05/08/2015   Procedure: ESOPHAGEAL DILATION;  Surgeon: Daneil Dolin, MD;  Location: [facility];  Service: Endoscopy;  Laterality: N/AVenia Minks 54/56  . ESOPHAGOGASTRODUODENOSCOPY  12/11/2011   [id number] Schatzki's ring; otherwise normal/Small hiatal hernia. Antral and bulbar erosions  .  ESOPHAGOGASTRODUODENOSCOPY (EGD) WITH PROPOFOL N/A 05/08/2015   Dr. Gala Romney: mild erosive reflux esophagitis, non-critical Schatzki's ring s/p dilation. Hiatal hernia.   . EUS  08/2010   Dr [facility] with EGD. Retained food. No recurrent esophageal lesion, bx negative.  Marland Kitchen LUNG BIOPSY     negative  . PARATHYROIDECTOMY  April 2017   [facility].   Marland Kitchen RIGHT OOPHORECTOMY     benign disease  . SHOULDER ARTHROSCOPY     Right; bone spurs removed  . TONSILLECTOMY    . TOTAL KNEE ARTHROPLASTY  2002   Right; previous arthroscopic surgery    Current Outpatient Medications  Medication Sig Dispense Refill  . acetaminophen (TYLENOL) 500  MG tablet Take 500 mg by mouth every 8 (eight) hours as needed for mild pain or moderate pain.    Marland Kitchen albuterol (PROVENTIL HFA;VENTOLIN HFA) 108 (90 BASE) MCG/ACT inhaler Inhale 2 puffs into the lungs every 6 (six) hours as needed. For shortness of breath    . amLODipine (NORVASC) 10 MG tablet Take 10 mg by mouth daily.     Marland Kitchen atenolol (TENORMIN) 100 MG tablet Take 100 mg by mouth daily.    Marland Kitchen donepezil (ARICEPT) 10 MG tablet Take 10 mg by mouth at bedtime.    . fluticasone (FLONASE) 50 MCG/ACT nasal spray Place 1 spray into both nostrils daily as needed for allergies.     . furosemide (LASIX) 20 MG tablet Take 20 mg daily as needed for leg swelling (Patient taking differently: Take 20 mg by mouth daily as needed for fluid or edema. ) 90 tablet 3  . gabapentin (NEURONTIN) 300 MG capsule Take 300 mg by mouth at bedtime.    Marland Kitchen HYDROcodone-acetaminophen (NORCO/VICODIN) 5-325 MG tablet Take 1 tablet by mouth every 6 (six) hours as needed for moderate pain.    Marland Kitchen loratadine (CLARITIN) 10 MG tablet Take 10 mg by mouth daily as needed for allergies. Reported on 12/19/2015    . LORazepam (ATIVAN) 0.5 MG tablet Take 0.5 mg by mouth 2 (two) times daily as needed for anxiety.     . lovastatin (MEVACOR) 40 MG tablet Take 40 mg by mouth at bedtime.    . Melatonin 3 MG TABS Take 6 mg by mouth at bedtime.    . memantine (NAMENDA) 5 MG tablet Take 5 mg by mouth 2 (two) times daily.    . metFORMIN (GLUCOPHAGE-XR) 500 MG 24 hr tablet Take 500 mg by mouth 2 (two) times daily.     Marland Kitchen OLANZapine (ZYPREXA) 2.5 MG tablet Take 2.5 mg by mouth daily as needed (for depression).     . OLANZapine (ZYPREXA) 7.5 MG tablet Take 7.5 mg by mouth at bedtime.    . RABEprazole (ACIPHEX) 20 MG tablet Take 1 tablet (20 mg total) by mouth daily before breakfast. 30 tablet 5  . spironolactone (ALDACTONE) 25 MG tablet Take 25 mg by mouth daily.    Marland Kitchen topiramate (TOPAMAX) 100 MG tablet Take 100 mg by mouth daily.     . traZODone (DESYREL) 100 MG  tablet Take 300 mg by mouth at bedtime.  3  . venlafaxine XR (EFFEXOR-XR) 150 MG 24 hr capsule Take 150 mg by mouth daily with breakfast.     . venlafaxine XR (EFFEXOR-XR) 75 MG 24 hr capsule Take 75 mg by mouth daily with breakfast.     . Vitamin D, Ergocalciferol, (DRISDOL) 50000 units CAPS capsule Take 50,000 Units by mouth every Wednesday.     . hydrocortisone (ANUSOL-HC) 2.5 % rectal cream  Place 1 application rectally 2 (two) times daily. 30 g 1   No current facility-administered medications for this visit.     Allergies as of 11/18/2017 - Review Complete 11/18/2017  Allergen Reaction Noted  . Elavil [amitriptyline] Other (See Comments) 06/03/2012  . Abilify [aripiprazole] Other (See Comments) 11/25/2012  . Codeine Hives, Nausea Only, and Other (See Comments)   . Latex Hives 11/21/2011  . Penicillin v Itching 08/02/2013  . Penicillins Hives   . Polyethylene glycol Other (See Comments) 12/26/2012  . Sulfonamide derivatives Nausea And Vomiting   . Cymbalta [duloxetine hcl] Rash 03/10/2013  . Remeron [mirtazapine] Other (See Comments) 05/06/2012    Family History  Problem Relation Age of Onset  . Stroke Father   . Alcohol abuse Father   . Heart attack Mother   . Depression Mother   . Anxiety disorder Mother   . Colon cancer Paternal Aunt   . Colon cancer Paternal Uncle   . Dementia Maternal Uncle   . ADD / ADHD Neg Hx   . Bipolar disorder Neg Hx   . Drug abuse Neg Hx   . OCD Neg Hx   . Paranoid behavior Neg Hx   . Schizophrenia Neg Hx   . Seizures Neg Hx   . Sexual abuse Neg Hx   . Physical abuse Neg Hx     Social History   Socioeconomic History  . Marital status: Single    Spouse name: Not on file  . Number of children: 1  . Years of education: Not on file  . Highest education level: Not on file  Occupational History  . Occupation: disabled    Employer: RETIRED  Social Needs  . Financial resource strain: Not on file  . Food insecurity:    Worry: Not on  file    Inability: Not on file  . Transportation needs:    Medical: Not on file    Non-medical: Not on file  Tobacco Use  . Smoking status: Never Smoker  . Smokeless tobacco: Never Used  Substance and Sexual Activity  . Alcohol use: No    Alcohol/week: 0.0 oz  . Drug use: No  . Sexual activity: Never  Lifestyle  . Physical activity:    Days per week: Not on file    Minutes per session: Not on file  . Stress: Not on file  Relationships  . Social connections:    Talks on phone: Not on file    Gets together: Not on file    Attends religious service: Not on file    Active member of club or organization: Not on file    Attends meetings of clubs or organizations: Not on file    Relationship status: Not on file  Other Topics Concern  . Not on file  Social History Narrative  . Not on file    Review of Systems: Gen: Denies fever, chills, anorexia. Denies fatigue, weakness, weight loss.  CV: Denies chest pain, palpitations, syncope, peripheral edema, and claudication. Resp: Denies dyspnea at rest, cough, wheezing, coughing up blood, and pleurisy. GI: see HPI  Derm: Denies rash, itching, dry skin Psych: Denies depression, anxiety, memory loss, confusion. No homicidal or suicidal ideation.  Heme: Denies bruising, bleeding, and enlarged lymph nodes.  Physical Exam: BP 134/82   Pulse 79   Temp 97.7 F (36.5 C) (Oral)   Ht 5\' 2"  (1.575 m)   Wt 212 lb (96.2 kg)   BMI 38.78 kg/m  General:   Alert and oriented.  No distress noted. Pleasant and cooperative.  Head:  Normocephalic and atraumatic. Eyes:  Conjuctiva clear without scleral icterus. Mouth:  Oral mucosa pink and moist.  Abdomen:  +BS, soft, non-tender and non-distended. No rebound or guarding. No HSM or masses noted. Rectal: without obvious fissure, hemorrhoids. Small pebble-sized moderately hard stool at rectum, digitally removed. NO fecal impaction with DRE. Patient confirms this stool is consistent with the small  amounts she passes intermittently.  Msk:  Symmetrical without gross deformities. Normal posture. Extremities:  Without edema. Neurologic:  Alert and  oriented x4 Psych:  Alert and cooperative. Normal mood and affect.  Lab Results  Component Value Date   WBC 5.3 11/04/2017   HGB 11.9 (L) 11/04/2017   HCT 38.6 11/04/2017   MCV 79.8 11/04/2017   PLT 200 11/04/2017   Labs done at [facility] in February 2018 with normal hemoglobin of 12.7, hematocrit 38.5, platelets 164,000.  Total bilirubin 0.7, alkaline phosphatase 50, albumin 3.6, AST 18, ALT 19, creatinine 0.8, BUN 16

## 2017-11-18 NOTE — Patient Instructions (Addendum)
I would like for you to start taking Symproic one tablet daily.   Please call me to see how this works.   I also have ordered an xray of your abdomen for today.   I sent in the cream to your pharmacy to use twice a day as needed for rectal discomfort, up to 7 days at a time. ([place])  We will see you back in 6-8 weeks!  It was a pleasure to see you today. I strive to create trusting relationships with patients to provide genuine, compassionate, and quality care. I value your feedback. If you receive a survey regarding your visit,  I greatly appreciate you taking time to fill this out.   [name], PhD, ANP-BC [facility]

## 2017-11-20 NOTE — Assessment & Plan Note (Signed)
72 year old with worsening constipation, failing outpatient prep for colonoscopy and presenting with formed stool day of procedure. Rectal exam without impaction, but she did have a very small pebble-like stool at opening that was easily removed. States this is a common occurrence, [place] stool scale #1. No improvement with Amitiza. Linzess with diarrhea. We do not have Movantik, so I am offering samples of Symproic due to likely OIC. Hopefully, she will do well with this and insurance coverage will not be an issue. Call with update. Check KUB today. Will need surveillance colonoscopy in future once constipation addressed. Return in 6-8 weeks.

## 2017-11-21 NOTE — Progress Notes (Signed)
CC'D TO PCP °

## 2017-11-24 ENCOUNTER — Encounter: Payer: Self-pay | Admitting: Cardiology

## 2017-11-24 ENCOUNTER — Ambulatory Visit (INDEPENDENT_AMBULATORY_CARE_PROVIDER_SITE_OTHER): Payer: Medicare HMO | Admitting: Cardiology

## 2017-11-24 VITALS — BP 126/70 | HR 56 | Ht 62.0 in | Wt 210.8 lb

## 2017-11-24 DIAGNOSIS — I10 Essential (primary) hypertension: Secondary | ICD-10-CM | POA: Diagnosis not present

## 2017-11-24 DIAGNOSIS — R0602 Shortness of breath: Secondary | ICD-10-CM

## 2017-11-24 DIAGNOSIS — I5032 Chronic diastolic (congestive) heart failure: Secondary | ICD-10-CM | POA: Diagnosis not present

## 2017-11-24 NOTE — Progress Notes (Signed)
Clinical Summary Ms. Horacek is a 72 y.o.female seen today for follow up of the following medical problems.  1. Lightheadedness/Dizziness  - started approx 1 year ago. Feeling of lightheadedness, but at times can feel like the room is spinning. Can happen laying down, sitting down, or standing. No association w/ exertion.Sometimes brought on by rolling over in bed. - event monitor shows just mild sinus brady which is her baseline    - just occasional symptoms recently. Mainly with standing.   2. Chronic diastolic heart failure - echo 08/2014 LVEF 60-65%, abnormal diastolic function indeterminate grade.  - no recent SOB/DOE/LE edema - takes lasix just prn.   4. SOB - no recent edema. SOB at times which is new, starting a few months - some wheezing. Somewhat improved with albuterol. - second hand smoke exposure.  -not  interested in PFTs.  3. HTN - she remains compliant with meds     New pcp Dr Tasia Catchings in [place], [place]    Past Medical History:  Diagnosis Date  . Adenomatous colon polyp 2006   excised in 2006 & 2010Due surveillance 06/2013  . Anxiety   . Anxiety and depression   . Asthma   . Breast tumor   . Chest discomfort   . Chronic back pain   . Chronic constipation   . Complication of anesthesia   . Depression   . Diverticulosis   . Fibromyalgia   . Gastroesophageal reflux disease   . Hiatal hernia   . History of benign esophageal tumor 2010   granular cell esophageal tumor (Dx 06/2008), resected via EMR 2011, due repeat EGD 02/2012  . Hyperlipidemia   . Hypertension   . Insomnia   . Migraines   . PONV (postoperative nausea and vomiting)   . Psychosis ([place])   . Sleep apnea    Stop Bang score of 5. Pt said she was told by Dr. Merlene Laughter that she had "a little bit" of sleep apena, but not bad enough to treat.  . Thyroid nodule   . Tumor of esophagus      Allergies  Allergen Reactions  . Elavil [Amitriptyline] Other (See Comments)    Felt  really nervous and felt like something was hold her feet or arm and/ or AM headache on it and back on it and went away off it.   . Abilify [Aripiprazole] Other (See Comments)    Dystonic reaction  . Codeine Hives, Nausea Only and Other (See Comments)    Feels funny  . Latex Hives  . Penicillin V Itching  . Penicillins Hives    Has patient had a PCN reaction causing immediate rash, facial/tongue/throat swelling, SOB or lightheadedness with hypotension: Yes Has patient had a PCN reaction causing severe rash involving mucus membranes or skin necrosis: No Has patient had a PCN reaction that required hospitalization No Has patient had a PCN reaction occurring within the last 10 years: No If all of the above answers are "NO", then may proceed with Cephalosporin use.   . Polyethylene Glycol Other (See Comments)    Per allergy test  . Sulfonamide Derivatives Nausea And Vomiting  . Cymbalta [Duloxetine Hcl] Rash  . Remeron [Mirtazapine] Other (See Comments)    Caused lots of strange, weird, crazy dreams.     Current Outpatient Medications  Medication Sig Dispense Refill  . acetaminophen (TYLENOL) 500 MG tablet Take 500 mg by mouth every 8 (eight) hours as needed for mild pain or moderate pain.    Marland Kitchen  albuterol (PROVENTIL HFA;VENTOLIN HFA) 108 (90 BASE) MCG/ACT inhaler Inhale 2 puffs into the lungs every 6 (six) hours as needed. For shortness of breath    . amLODipine (NORVASC) 10 MG tablet Take 10 mg by mouth daily.     Marland Kitchen atenolol (TENORMIN) 100 MG tablet Take 100 mg by mouth daily.    Marland Kitchen donepezil (ARICEPT) 10 MG tablet Take 10 mg by mouth at bedtime.    . fluticasone (FLONASE) 50 MCG/ACT nasal spray Place 1 spray into both nostrils daily as needed for allergies.     . furosemide (LASIX) 20 MG tablet Take 20 mg daily as needed for leg swelling (Patient taking differently: Take 20 mg by mouth daily as needed for fluid or edema. ) 90 tablet 3  . gabapentin (NEURONTIN) 300 MG capsule Take 300 mg by  mouth at bedtime.    Marland Kitchen HYDROcodone-acetaminophen (NORCO/VICODIN) 5-325 MG tablet Take 1 tablet by mouth every 6 (six) hours as needed for moderate pain.    . hydrocortisone (ANUSOL-HC) 2.5 % rectal cream Place 1 application rectally 2 (two) times daily. 30 g 1  . loratadine (CLARITIN) 10 MG tablet Take 10 mg by mouth daily as needed for allergies. Reported on 12/19/2015    . LORazepam (ATIVAN) 0.5 MG tablet Take 0.5 mg by mouth 2 (two) times daily as needed for anxiety.     . lovastatin (MEVACOR) 40 MG tablet Take 40 mg by mouth at bedtime.    . Melatonin 3 MG TABS Take 6 mg by mouth at bedtime.    . memantine (NAMENDA) 5 MG tablet Take 5 mg by mouth 2 (two) times daily.    . metFORMIN (GLUCOPHAGE-XR) 500 MG 24 hr tablet Take 500 mg by mouth 2 (two) times daily.     Marland Kitchen OLANZapine (ZYPREXA) 2.5 MG tablet Take 2.5 mg by mouth daily as needed (for depression).     . OLANZapine (ZYPREXA) 7.5 MG tablet Take 7.5 mg by mouth at bedtime.    . RABEprazole (ACIPHEX) 20 MG tablet Take 1 tablet (20 mg total) by mouth daily before breakfast. 30 tablet 5  . spironolactone (ALDACTONE) 25 MG tablet Take 25 mg by mouth daily.    Marland Kitchen topiramate (TOPAMAX) 100 MG tablet Take 100 mg by mouth daily.     . traZODone (DESYREL) 100 MG tablet Take 300 mg by mouth at bedtime.  3  . venlafaxine XR (EFFEXOR-XR) 150 MG 24 hr capsule Take 150 mg by mouth daily with breakfast.     . venlafaxine XR (EFFEXOR-XR) 75 MG 24 hr capsule Take 75 mg by mouth daily with breakfast.     . Vitamin D, Ergocalciferol, (DRISDOL) 50000 units CAPS capsule Take 50,000 Units by mouth every Wednesday.      No current facility-administered medications for this visit.      Past Surgical History:  Procedure Laterality Date  . ABDOMINAL HYSTERECTOMY    . BRAVO [place] STUDY  12/11/2011   Procedure: BRAVO [place];  Surgeon: Daneil Dolin, MD;  [facility];  Service: Endoscopy;  Laterality: N/A;  . BREAST EXCISIONAL BIOPSY  1990s, 2012    Left x2-sclerosing ductal papilloma-2012  . CHOLECYSTECTOMY N/A 04/09/2013   Procedure: LAPAROSCOPIC CHOLECYSTECTOMY;  Surgeon: Jamesetta So, MD;  Location: [facility];  Service: General;  Laterality: N/A;  . COLONOSCOPY  06/2008, 06/2011   sigmoid tics, tubular adenoma; 2013: anal canal hemorrhoids  . COLONOSCOPY  07/10/2011   Anal canal hemorrhoids likely the cause of hematochezia  in the setting of constipation; otherwise normal rectum ;submucosal  petechiae in left colon of doubtful clinical significance; otherwise, normal colon  . COLONOSCOPY WITH PROPOFOL N/A 11/10/2017   Procedure: COLONOSCOPY WITH PROPOFOL  cancelled in Pre OP;  Surgeon: Daneil Dolin, MD;  [facility];  Service: Endoscopy;  Laterality: N/A;  9:45am  . ESOPHAGEAL DILATION N/A 05/08/2015   Procedure: ESOPHAGEAL DILATION;  Surgeon: Daneil Dolin, MD;  Location: [facility];  Service: Endoscopy;  Laterality: N/AVenia Minks 54/56  . ESOPHAGOGASTRODUODENOSCOPY  12/11/2011   [id number] Schatzki's ring; otherwise normal/Small hiatal hernia. Antral and bulbar erosions  . ESOPHAGOGASTRODUODENOSCOPY (EGD) WITH PROPOFOL N/A 05/08/2015   Dr. Gala Romney: mild erosive reflux esophagitis, non-critical Schatzki's ring s/p dilation. Hiatal hernia.   . EUS  08/2010   Dr [facility] with EGD. Retained food. No recurrent esophageal lesion, bx negative.  Marland Kitchen LUNG BIOPSY     negative  . PARATHYROIDECTOMY  April 2017   [facility].   Marland Kitchen RIGHT OOPHORECTOMY     benign disease  . SHOULDER ARTHROSCOPY     Right; bone spurs removed  . TONSILLECTOMY    . TOTAL KNEE ARTHROPLASTY  2002   Right; previous arthroscopic surgery     Allergies  Allergen Reactions  . Elavil [Amitriptyline] Other (See Comments)    Felt really nervous and felt like something was hold her feet or arm and/ or AM headache on it and back on it and went away off it.   . Abilify [Aripiprazole] Other (See Comments)    Dystonic reaction  . Codeine Hives, Nausea Only and Other  (See Comments)    Feels funny  . Latex Hives  . Penicillin V Itching  . Penicillins Hives    Has patient had a PCN reaction causing immediate rash, facial/tongue/throat swelling, SOB or lightheadedness with hypotension: Yes Has patient had a PCN reaction causing severe rash involving mucus membranes or skin necrosis: No Has patient had a PCN reaction that required hospitalization No Has patient had a PCN reaction occurring within the last 10 years: No If all of the above answers are "NO", then may proceed with Cephalosporin use.   . Polyethylene Glycol Other (See Comments)    Per allergy test  . Sulfonamide Derivatives Nausea And Vomiting  . Cymbalta [Duloxetine Hcl] Rash  . Remeron [Mirtazapine] Other (See Comments)    Caused lots of strange, weird, crazy dreams.      Family History  Problem Relation Age of Onset  . Stroke Father   . Alcohol abuse Father   . Heart attack Mother   . Depression Mother   . Anxiety disorder Mother   . Colon cancer Paternal Aunt   . Colon cancer Paternal Uncle   . Dementia Maternal Uncle   . ADD / ADHD Neg Hx   . Bipolar disorder Neg Hx   . Drug abuse Neg Hx   . OCD Neg Hx   . Paranoid behavior Neg Hx   . Schizophrenia Neg Hx   . Seizures Neg Hx   . Sexual abuse Neg Hx   . Physical abuse Neg Hx      Social History Ms. Nuon reports that she has never smoked. She has never used smokeless tobacco. Ms. Strength reports that she does not drink alcohol.   Review of Systems CONSTITUTIONAL: No weight loss, fever, chills, weakness or fatigue.  HEENT: Eyes: No visual loss, blurred vision, double vision or yellow sclerae.No hearing loss, sneezing, congestion, runny nose or sore throat.  SKIN: No rash or itching.  CARDIOVASCULAR: per hpi RESPIRATORY: No shortness of breath, cough or sputum.  GASTROINTESTINAL: No anorexia, nausea, vomiting or diarrhea. No abdominal pain or blood.  GENITOURINARY: No burning on urination, no  polyuria NEUROLOGICAL: No headache, dizziness, syncope, paralysis, ataxia, numbness or tingling in the extremities. No change in bowel or bladder control.  MUSCULOSKELETAL: No muscle, back pain, joint pain or stiffness.  LYMPHATICS: No enlarged nodes. No history of splenectomy.  PSYCHIATRIC: No history of depression or anxiety.  ENDOCRINOLOGIC: No reports of sweating, cold or heat intolerance. No polyuria or polydipsia.  Marland Kitchen   Physical Examination Vitals:   11/24/17 1307  BP: 126/70  Pulse: (!) 56  SpO2: 98%   Vitals:   11/24/17 1307  Weight: 210 lb 12.8 oz (95.6 kg)  Height: 5\' 2"  (1.575 m)    Gen: resting comfortably, no acute distress HEENT: no scleral icterus, pupils equal round and reactive, no palptable cervical adenopathy,  CV: RRR, no m/r/g, no jvd Resp: Clear to auscultation bilaterally GI: abdomen is soft, non-tender, non-distended, normal bowel sounds, no hepatosplenomegaly MSK: extremities are warm, no edema.  Skin: warm, no rash Neuro:  no focal deficits Psych: appropriate affect   Diagnostic Studies 02/17/13 Event monitor: baseline sinus brady in 50s, symptoms of dizziness correlate w/ sinus brady in 50s. Occasional PVCs, 1 PVC couplet.   01/2012 Carotid US: minimal plaque at bifurcations, no stenosis   10/2014 echo Study Conclusions  - Left ventricle: The cavity size was normal. Wall thickness was increased in a pattern of mild LVH. Systolic function was normal. The estimated ejection fraction was in the range of 60% to 65%. Abnormal diastoilc function, indeterminate grade. - Aortic valve: There was mild regurgitation. Valve area (VTI): 2.48 cm^2. Valve area (Vmax): 2.89 cm^2. - Technically adequate study.     Assessment and Plan  1. Chronic diastolic HF - doing well, continue current meds  2. HTN - at goal, continue current meds  3. SOB - appears euvolemmic - we discussed possible PFTs, she is not intrested at this time - EKG today  shows SR no ischemic changes    F/u6 months       Arnoldo Lenis, M.D.

## 2017-11-24 NOTE — Patient Instructions (Signed)

## 2017-11-25 NOTE — Progress Notes (Signed)
Continue taking Symproic. She may take additional Miralax once to BID every day. I can send in prescription so we will see how well this is covered, OR she could trial Movantik samples first to see which one she prefers.

## 2017-11-25 NOTE — Progress Notes (Signed)
Moderate stool burden, no obstruction. How is she doing with Symproic?

## 2017-11-28 ENCOUNTER — Encounter: Payer: Self-pay | Admitting: Cardiology

## 2017-12-02 ENCOUNTER — Telehealth: Payer: Self-pay

## 2017-12-02 NOTE — Telephone Encounter (Signed)
Noted, samples are ready for pickup.  

## 2017-12-02 NOTE — Telephone Encounter (Signed)
Movantik 12.5 mg.

## 2017-12-02 NOTE — Telephone Encounter (Signed)
AB, I was holding pts name for Movantik samples. What mg of Movantik would you like pt to have? Pt will pick samples up tomorrow.

## 2018-01-19 ENCOUNTER — Encounter (HOSPITAL_COMMUNITY): Payer: Self-pay

## 2018-01-19 ENCOUNTER — Ambulatory Visit (HOSPITAL_COMMUNITY)
Admission: RE | Admit: 2018-01-19 | Discharge: 2018-01-19 | Disposition: A | Discharge status: Home / Self Care | Payer: Medicare HMO | Source: Ambulatory Visit | Attending: Family Medicine | Admitting: Family Medicine

## 2018-01-19 DIAGNOSIS — Z1231 Encounter for screening mammogram for malignant neoplasm of breast: Secondary | ICD-10-CM | POA: Diagnosis not present

## 2018-01-19 HISTORY — DX: Unspecified lump in unspecified breast: N63.0

## 2018-02-19 ENCOUNTER — Ambulatory Visit: Payer: Self-pay | Admitting: Gastroenterology

## 2018-02-20 ENCOUNTER — Ambulatory Visit: Payer: Medicare HMO | Admitting: Gastroenterology

## 2018-03-23 ENCOUNTER — Telehealth: Payer: Self-pay

## 2018-03-23 MED ORDER — RABEPRAZOLE SODIUM 20 MG PO TBEC: 20.0000 mg | DELAYED_RELEASE_TABLET | Freq: Every day | ORAL | 5 refills | Status: DC

## 2018-03-23 NOTE — Telephone Encounter (Signed)
Done

## 2018-03-23 NOTE — Telephone Encounter (Signed)
[facility] is requesting refills for pt for the Rabeprazole 20 mg.  [facility] is listed as pharmacy now.

## 2018-06-01 ENCOUNTER — Encounter: Payer: Self-pay | Admitting: Gastroenterology

## 2018-06-01 ENCOUNTER — Ambulatory Visit: Payer: Medicare HMO | Admitting: Gastroenterology

## 2018-06-01 ENCOUNTER — Telehealth: Payer: Self-pay

## 2018-06-01 ENCOUNTER — Other Ambulatory Visit: Payer: Self-pay

## 2018-06-01 VITALS — BP 155/82 | HR 60 | Temp 97.8°F | Ht 62.0 in | Wt 205.4 lb

## 2018-06-01 DIAGNOSIS — K5909 Other constipation: Secondary | ICD-10-CM | POA: Diagnosis not present

## 2018-06-01 DIAGNOSIS — D126 Benign neoplasm of colon, unspecified: Secondary | ICD-10-CM | POA: Diagnosis not present

## 2018-06-01 MED ORDER — NA SULFATE-K SULFATE-MG SULF 17.5-3.13-1.6 GM/177ML PO SOLN: 1.0000 | ORAL | 0 refills | Status: DC

## 2018-06-01 MED ORDER — NALOXEGOL OXALATE 25 MG PO TABS: 25.0000 mg | ORAL_TABLET | Freq: Every day | ORAL | 3 refills | Status: DC

## 2018-06-01 NOTE — Patient Instructions (Addendum)
We have arranged a colonoscopy with Dr. Gala Romney in the near future. Don't take metformin the day of the procedure.   Let's stop Amitiza. I would like for you to try the samples of Movantik once each morning. Let me know how this works for you. I am sending to the pharmacy as well ([name])   We will see you in 3 months!  Let me know if you still have left lower belly discomfort after your bowel habits have improved.  I enjoyed seeing you again today! As you know, I value our relationship and want to provide genuine, compassionate, and quality care. I welcome your feedback. If you receive a survey regarding your visit,  I greatly appreciate you taking time to fill this out. See you next time!  [name], PhD, ANP-BC [facility]

## 2018-06-01 NOTE — Progress Notes (Signed)
Referring Provider: Andrey Campanile, MD Primary Care Physician:  Andrey Campanile, MD Primary GI: Dr. Gala Romney   Chief Complaint  Patient presents with  . Gastroesophageal Reflux    f/u  . Constipation    f/u    HPI:   [name] is a 72 y.o. female presenting today with a history of GERD and constipation likely opioid-induced. She has not done well with Linzess. She failed colonoscopy prep in May 2019, with formed stool day of procedure. Last colonoscopy in 2013 and need for surveillance due to history of adenomas.   Amitiza 24 mcg BID, BM every 3-4 days. Liked Symproic. Taking Miralax once daily. Some abdominal discomfort in LLQ/left groin. Stool soft. Has to strain. No rectal bleeding. Aciphex 20 mg daily controlling GERD symptoms.    Past Medical History:  Diagnosis Date  . Adenomatous colon polyp 2006   excised in 2006 & 2010Due surveillance 06/2013  . Anxiety   . Anxiety and depression   . Asthma   . Breast mass    right nipple bengn mass per patient  . Breast tumor   . Chest discomfort   . Chronic back pain   . Chronic constipation   . Complication of anesthesia   . Depression   . Diverticulosis   . Fibromyalgia   . Gastroesophageal reflux disease   . Hiatal hernia   . History of benign esophageal tumor 2010   granular cell esophageal tumor (Dx 06/2008), resected via EMR 2011, due repeat EGD 02/2012  . Hyperlipidemia   . Hypertension   . Insomnia   . Migraines   . PONV (postoperative nausea and vomiting)   . Psychosis ([place])   . Sleep apnea    Stop Bang score of 5. Pt said she was told by Dr. Merlene Laughter that she had "a little bit" of sleep apena, but not bad enough to treat.  . Thyroid nodule   . Tumor of esophagus     Past Surgical History:  Procedure Laterality Date  . ABDOMINAL HYSTERECTOMY    . BRAVO [place] STUDY  12/11/2011   Procedure: BRAVO [place];  Surgeon: Daneil Dolin, MD;  Location: [facility];  Service: Endoscopy;  Laterality:  N/A;  . 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 CHOLECYSTECTOMY N/A 04/09/2013   Procedure: LAPAROSCOPIC CHOLECYSTECTOMY;  Surgeon: Jamesetta So, MD;  Location: [facility] ORS;  Service: General;  Laterality: N/A;  . COLONOSCOPY  06/2008, 06/2011   sigmoid tics, tubular adenoma; 2013: anal canal hemorrhoids  . COLONOSCOPY  07/10/2011   Anal canal hemorrhoids likely the cause of hematochezia in the setting of constipation; otherwise normal rectum ;submucosal  petechiae in left colon of doubtful clinical significance; otherwise, normal colon  . COLONOSCOPY WITH PROPOFOL N/A 11/10/2017   cancelled in pre-op  . ESOPHAGEAL DILATION N/A 05/08/2015   Procedure: ESOPHAGEAL DILATION;  Surgeon: Daneil Dolin, MD;  Location: [facility] ORS;  Service: Endoscopy;  Laterality: N/AVenia Minks 54/56  . ESOPHAGOGASTRODUODENOSCOPY  12/11/2011   VXB:[id number] Schatzki's ring; otherwise normal/Small hiatal hernia. Antral and bulbar erosions  . ESOPHAGOGASTRODUODENOSCOPY (EGD) WITH PROPOFOL N/A 05/08/2015   Dr. Gala Romney: mild erosive reflux esophagitis, non-critical Schatzki's ring s/p dilation. Hiatal hernia.   . EUS  08/2010   [facility] with EGD. Retained food. No recurrent esophageal lesion, bx negative.  Marland Kitchen LUNG BIOPSY     negative  . PARATHYROIDECTOMY  April 2017  [facility].   . RIGHT OOPHORECTOMY     benign disease  . SHOULDER ARTHROSCOPY     Right; bone spurs removed  . TONSILLECTOMY    . TOTAL KNEE ARTHROPLASTY  2002   Right; previous arthroscopic surgery    Current Outpatient Medications  Medication Sig Dispense Refill  . albuterol (PROVENTIL HFA;VENTOLIN HFA) 108 (90 BASE) MCG/ACT inhaler Inhale 2 puffs into the lungs every 6 (six) hours as needed. For shortness of breath    . amLODipine (NORVASC) 10 MG tablet Take 10 mg by mouth daily.     Marland Kitchen atenolol (TENORMIN) 100 MG tablet Take 100 mg by mouth daily.    Marland Kitchen donepezil (ARICEPT) 10 MG tablet Take 10 mg by mouth at  bedtime.    . fluticasone (FLONASE) 50 MCG/ACT nasal spray Place 1 spray into both nostrils daily as needed for allergies.     Marland Kitchen gabapentin (NEURONTIN) 300 MG capsule Take 300 mg by mouth at bedtime.    Marland Kitchen HYDROcodone-acetaminophen (NORCO/VICODIN) 5-325 MG tablet Take 0.5-1 tablets by mouth daily as needed for moderate pain.     Marland Kitchen lovastatin (MEVACOR) 40 MG tablet Take 40 mg by mouth at bedtime.    Marland Kitchen lubiprostone (AMITIZA) 24 MCG capsule Take 24 mcg by mouth 2 (two) times daily with a meal.    . Melatonin 3 MG TABS Take 6 mg by mouth at bedtime.    . memantine (NAMENDA) 10 MG tablet Take 1 tablet by mouth 2 (two) times daily.    . metFORMIN (GLUCOPHAGE-XR) 500 MG 24 hr tablet Take 500 mg by mouth 2 (two) times daily.     Marland Kitchen OLANZapine (ZYPREXA) 2.5 MG tablet Take 2.5 mg by mouth at bedtime.     Marland Kitchen OLANZapine (ZYPREXA) 7.5 MG tablet Take 7.5 mg by mouth at bedtime.    . RABEprazole (ACIPHEX) 20 MG tablet Take 1 tablet (20 mg total) by mouth daily before breakfast. 30 tablet 5  . spironolactone (ALDACTONE) 25 MG tablet Take 25 mg by mouth daily.    Marland Kitchen topiramate (TOPAMAX) 100 MG tablet Take 100 mg by mouth daily.     . traZODone (DESYREL) 100 MG tablet Take 300 mg by mouth at bedtime.   3  . venlafaxine XR (EFFEXOR-XR) 150 MG 24 hr capsule Take 150 mg by mouth daily with breakfast.     . venlafaxine XR (EFFEXOR-XR) 75 MG 24 hr capsule Take 75 mg by mouth daily with breakfast.     . Vitamin D, Ergocalciferol, (DRISDOL) 50000 units CAPS capsule Take 50,000 Units by mouth every Wednesday.     . Na Sulfate-K Sulfate-Mg Sulf (SUPREP BOWEL PREP KIT) 17.5-3.13-1.6 GM/177ML SOLN Take 1 kit by mouth as directed. 1 Bottle 0  . naloxegol oxalate (MOVANTIK) 25 MG TABS tablet Take 1 tablet (25 mg total) by mouth daily. Take 1 hour before breakfast. 30 tablet 3   No current facility-administered medications for this visit.     Allergies as of 06/01/2018 - Review Complete 06/01/2018  Allergen Reaction Noted  .  Elavil [amitriptyline] Other (See Comments) 06/03/2012  . Abilify [aripiprazole] Other (See Comments) 11/25/2012  . Codeine Hives, Nausea Only, and Other (See Comments)   . Latex Hives 11/21/2011  . Penicillin v Itching 08/02/2013  . Penicillins Hives   . Polyethylene glycol Other (See Comments) 12/26/2012  . Sulfonamide derivatives Nausea And Vomiting   . Cymbalta [duloxetine hcl] Rash 03/10/2013  . Remeron [mirtazapine] Other (See Comments) 05/06/2012    Family History  Problem  Relation Age of Onset  . Stroke Father   . Alcohol abuse Father   . Heart attack Mother   . Depression Mother   . Anxiety disorder Mother   . Colon cancer Paternal Aunt   . Colon cancer Paternal Uncle   . Dementia Maternal Uncle   . ADD / ADHD Neg Hx   . Bipolar disorder Neg Hx   . Drug abuse Neg Hx   . OCD Neg Hx   . Paranoid behavior Neg Hx   . Schizophrenia Neg Hx   . Seizures Neg Hx   . Sexual abuse Neg Hx   . Physical abuse Neg Hx     Social History   Socioeconomic History  . Marital status: Single    Spouse name: Not on file  . Number of children: 1  . Years of education: Not on file  . Highest education level: Not on file  Occupational History  . Occupation: disabled    Employer: RETIRED  Social Needs  . Financial resource strain: Not on file  . Food insecurity:    Worry: Not on file    Inability: Not on file  . Transportation needs:    Medical: Not on file    Non-medical: Not on file  Tobacco Use  . Smoking status: Never Smoker  . Smokeless tobacco: Never Used  Substance and Sexual Activity  . Alcohol use: No    Alcohol/week: 0.0 standard drinks  . Drug use: No  . Sexual activity: Never  Lifestyle  . Physical activity:    Days per week: Not on file    Minutes per session: Not on file  . Stress: Not on file  Relationships  . Social connections:    Talks on phone: Not on file    Gets together: Not on file    Attends religious service: Not on file    Active member of  club or organization: Not on file    Attends meetings of clubs or organizations: Not on file    Relationship status: Not on file  Other Topics Concern  . Not on file  Social History Narrative  . Not on file    Review of Systems: Gen: Denies fever, chills, anorexia. Denies fatigue, weakness, weight loss.  CV: Denies chest pain, palpitations, syncope, peripheral edema, and claudication. Resp: Denies dyspnea at rest, cough, wheezing, coughing up blood, and pleurisy. GI: see HPI  Derm: Denies rash, itching, dry skin Psych: Denies depression, anxiety, memory loss, confusion. No homicidal or suicidal ideation.  Heme: Denies bruising, bleeding, and enlarged lymph nodes.  Physical Exam: BP (!) 155/82   Pulse 60   Temp 97.8 F (36.6 C) (Oral)   Ht '5\' 2"'  (1.575 m)   Wt 205 lb 6.4 oz (93.2 kg)   BMI 37.57 kg/m  General:   Alert and oriented. No distress noted. Pleasant and cooperative.  Head:  Normocephalic and atraumatic. Eyes:  Conjuctiva clear without scleral icterus. Mouth:  Oral mucosa pink and moist.  Lungs: clear to auscultation bilaterally Cardiac: S1 S2 present without murmurs  Abdomen:  +BS, soft, non-tender and non-distended. No rebound or guarding. No HSM or masses noted. Msk:  Symmetrical without gross deformities. Normal posture. Extremities:  Without edema. Neurologic:  Alert and  oriented x4 Psych:  Alert and cooperative. Normal mood and affect.

## 2018-06-01 NOTE — Assessment & Plan Note (Addendum)
No improvement with Linzess or Amitiza. Will trial Movantik 25 mg daily. Samples and prescription provided. Reviewed creatinine clearance, which is 69. Therefore, she can take standard dosing of Movantik. 3 month return. As of note, vague left groin discomfort. If continues despite more aggressive bowel regimen, pursue CT.

## 2018-06-01 NOTE — Assessment & Plan Note (Signed)
72 year old female with need for surveillance colonoscopy due to history of adenomas. Unfortunately, she did not do well with prep in May 2019 and had formed stool. Addressing constipation currently, and we will modify the prep prior to colonoscopy in hopes this will be successful.   Proceed with TCS with Dr. Gala Romney in near future: the risks, benefits, and alternatives have been discussed with the patient in detail. The patient states understanding and desires to proceed. Propofol due to polypharmacy Starting Movantik 25 mg daily: samples provided and sent to pharmacy  No metformin day of procedure Extra full day of clear liquids in addition to standard prep

## 2018-06-01 NOTE — Telephone Encounter (Signed)
Tried to call pt to inform of pre-op appt 07/16/18 at 12:45pm, pt unavailable, LMOVM. Letter mailed.

## 2018-06-02 NOTE — Progress Notes (Signed)
cc'd to pcp 

## 2018-06-08 ENCOUNTER — Other Ambulatory Visit (HOSPITAL_BASED_OUTPATIENT_CLINIC_OR_DEPARTMENT_OTHER): Payer: Self-pay

## 2018-06-08 DIAGNOSIS — R5383 Other fatigue: Secondary | ICD-10-CM

## 2018-06-08 DIAGNOSIS — G4700 Insomnia, unspecified: Secondary | ICD-10-CM

## 2018-06-08 DIAGNOSIS — R0683 Snoring: Secondary | ICD-10-CM

## 2018-06-11 ENCOUNTER — Ambulatory Visit: Payer: Medicare HMO | Attending: Family Medicine | Admitting: Neurology

## 2018-06-11 DIAGNOSIS — G4700 Insomnia, unspecified: Secondary | ICD-10-CM | POA: Insufficient documentation

## 2018-06-11 DIAGNOSIS — R5383 Other fatigue: Secondary | ICD-10-CM

## 2018-06-11 DIAGNOSIS — G40209 Localization-related (focal) (partial) symptomatic epilepsy and epileptic syndromes with complex partial seizures, not intractable, without status epilepticus: Secondary | ICD-10-CM | POA: Diagnosis not present

## 2018-06-11 DIAGNOSIS — R0683 Snoring: Secondary | ICD-10-CM | POA: Diagnosis not present

## 2018-07-02 NOTE — Procedures (Signed)
[place] A. Merlene Laughter, MD     [email or web address]             HOME SLEEP TEST   LOCATION: [place]  Patient Name: [name], [name] Date: 06/11/2018 Gender: Female D.O.B: 1946-02-24 Age (years): 72 Referring Provider: Jackalyn Lombard MD Height (inches): 62 Interpreting Physician: Phillips Odor MD, ABSM Weight (lbs): 205 RPSGT: Peak, Robert BMI: 37 MRN: [id number] Neck Size: CLINICAL INFORMATION Sleep Study Type: HST     Indication for sleep study: Fatigue, Insomnia, Snoring     Epworth Sleepiness Score: NA  SLEEP STUDY TECHNIQUE A multi-channel overnight portable sleep study was performed. The channels recorded were: nasal airflow, thoracic respiratory movement, and oxygen saturation with a pulse oximetry. Snoring was also monitored.  MEDICATIONS Patient self administered medications include: N/A.  Current Outpatient Medications:  .  albuterol (PROVENTIL HFA;VENTOLIN HFA) 108 (90 BASE) MCG/ACT inhaler, Inhale 2 puffs into the lungs every 6 (six) hours as needed for wheezing or shortness of breath. , Disp: , Rfl:  .  amLODipine (NORVASC) 10 MG tablet, Take 10 mg by mouth daily. , Disp: , Rfl:  .  atenolol (TENORMIN) 100 MG tablet, Take 100 mg by mouth daily., Disp: , Rfl:  .  donepezil (ARICEPT) 10 MG tablet, Take 10 mg by mouth at bedtime., Disp: , Rfl:  .  fluticasone (FLONASE) 50 MCG/ACT nasal spray, Place 1 spray into both nostrils daily as needed for allergies. , Disp: , Rfl:  .  gabapentin (NEURONTIN) 300 MG capsule, Take 300 mg by mouth at bedtime. , Disp: , Rfl:  .  HYDROcodone-acetaminophen (NORCO/VICODIN) 5-325 MG tablet, Take 1 tablet by mouth daily as needed for moderate pain. , Disp: , Rfl:  .  lovastatin (MEVACOR) 40 MG tablet, Take 40 mg by mouth at bedtime., Disp: , Rfl:  .  Melatonin 3 MG TABS, Take 9 mg by mouth at bedtime. , Disp: , Rfl:  .  memantine (NAMENDA) 10 MG tablet, Take 10 mg by mouth 2 (two) times daily. , Disp: , Rfl:   .  metFORMIN (GLUCOPHAGE-XR) 500 MG 24 hr tablet, Take 1,000 mg by mouth 2 (two) times daily. , Disp: , Rfl:  .  Na Sulfate-K Sulfate-Mg Sulf (SUPREP BOWEL PREP KIT) 17.5-3.13-1.6 GM/177ML SOLN, Take 1 kit by mouth as directed., Disp: 1 Bottle, Rfl: 0 .  naloxegol oxalate (MOVANTIK) 25 MG TABS tablet, Take 1 tablet (25 mg total) by mouth daily. Take 1 hour before breakfast., Disp: 30 tablet, Rfl: 3 .  OLANZapine (ZYPREXA) 7.5 MG tablet, Take 7.5 mg by mouth at bedtime., Disp: , Rfl:  .  RABEprazole (ACIPHEX) 20 MG tablet, Take 1 tablet (20 mg total) by mouth daily before breakfast., Disp: 30 tablet, Rfl: 5 .  spironolactone (ALDACTONE) 25 MG tablet, Take 25 mg by mouth daily., Disp: , Rfl:  .  topiramate (TOPAMAX) 100 MG tablet, Take 100 mg by mouth daily. , Disp: , Rfl:  .  traZODone (DESYREL) 100 MG tablet, Take 300 mg by mouth at bedtime. , Disp: , Rfl: 3 .  venlafaxine XR (EFFEXOR-XR) 150 MG 24 hr capsule, Take 150 mg by mouth daily with breakfast. , Disp: , Rfl:  .  venlafaxine XR (EFFEXOR-XR) 75 MG 24 hr capsule, Take 75 mg by mouth daily with breakfast. , Disp: , Rfl:  .  Vitamin D, Ergocalciferol, (DRISDOL) 50000 units CAPS capsule, Take 50,000 Units by mouth every Wednesday. , Disp: , Rfl:   SLEEP ARCHITECTURE Patient was studied for 535.4  minutes. The sleep efficiency was 89.2 % and the patient was supine for 50.5%. The arousal index was 0.0 per hour.  RESPIRATORY PARAMETERS The overall AHI was 4.5 per hour, with a central apnea index of 3.2 per hour.  The oxygen nadir was 90% during sleep.     CARDIAC DATA Mean heart rate during sleep was 66.7 bpm.  IMPRESSIONS No significant obstructive sleep apnea occurred during this study (AHI = 4.5/h). No significant central sleep apnea occurred during this study (CAI = 3.2/h).   Delano Metz, MD Diplomate, American Board of Sleep Medicine.  ELECTRONICALLY SIGNED ON:  07/02/2018, 2:43 PM [place] PH:  [phone]   FX: [phone] [place]

## 2018-07-13 NOTE — Patient Instructions (Signed)
[name]  07/13/2018     @PREFPERIOPPHARMACY @   Your procedure is scheduled on  07/23/2018 .  Report to [name] at  745   A.M.  Call this number if you have problems the morning of surgery:  [phone]   Remember:  Follow the diet and prep instructions given to you by Dr [name] office.                    Take these medicines the morning of surgery with A SIP OF WATER  Amlodipine, atenolol,gabapentin, hydrocodone ( if needed), namenda, aciphex, topamax ( if needed), effexor. Use your inhaler before you come.    Do not wear jewelry, make-up or nail polish.  Do not wear lotions, powders, or perfumes, or deodorant.  Do not shave 48 hours prior to surgery.  Men may shave face and neck.  Do not bring valuables to the hospital.  [name] is not responsible for any belongings or valuables.  Contacts, dentures or bridgework may not be worn into surgery.  Leave your suitcase in the car.  After surgery it may be brought to your room.  For patients admitted to the hospital, discharge time will be determined by your treatment team.  Patients discharged the day of surgery will not be allowed to drive home.   Name and phone number of your driver:   family Special instructions:  DO NOT take any medications for diabetes before you come the day of your procedure.  Please read over the following fact sheets that you were given. Anesthesia Post-op Instructions and Care and Recovery After Surgery       Colonoscopy, Adult A colonoscopy is an exam to look at the large intestine. It is done to check for problems, such as:  Lumps (tumors).  Growths (polyps).  Swelling (inflammation).  Bleeding. What happens before the procedure? Eating and drinking Follow instructions from your doctor about eating and drinking. These instructions may include:  A few days before the procedure - follow a low-fiber diet. ? Avoid nuts. ? Avoid seeds. ? Avoid dried  fruit. ? Avoid raw fruits. ? Avoid vegetables.  1-3 days before the procedure - follow a clear liquid diet. Avoid liquids that have red or purple dye. Drink only clear liquids, such as: ? Clear broth or bouillon. ? Black coffee or tea. ? Clear juice. ? Clear soft drinks or sports drinks. ? Gelatin dessert. ? Popsicles.  On the day of the procedure - do not eat or drink anything during the 2 hours before the procedure. Up to 2 hours before the procedure, you may continue to drink clear liquids, such as water or clear fruit juice.  Bowel prep If you were prescribed an oral bowel prep:  Take it as told by your doctor. Starting the day before your procedure, you will need to drink a lot of liquid. The liquid will cause you to poop (have bowel movements) until your poop is almost clear or light green.  To clean out your colon, you may also be given: ? Laxative medicines. ? Instructions about how to use an enema.  If your skin or butt gets irritated from diarrhea, you may: ? Wipe the area with wipes that have medicine in them, such as adult wet wipes with aloe and vitamin E. ? Put something on your skin that soothes the area, such as petroleum jelly.  If you throw up (vomit) while  drinking the bowel prep, take a break for up to 60 minutes. Then begin the bowel prep again. If you keep throwing up and you cannot take the bowel prep without throwing up, call your doctor. General instructions  Ask your doctor about: ? Changing or stopping your normal medicines. This is important if you take iron pills, diabetes medicines, or blood thinners. ? Taking medicines such as aspirin and ibuprofen. These medicines can thin your blood. Do not take these medicines unless your doctor tells you to take them.  Plan to have someone take you home from the hospital or clinic. What happens during the procedure?   An IV tube may be put into one of your veins.  You will be given medicine to help you relax  (sedative).  To reduce your risk of infection: ? Your doctors will wash their hands. ? Your anal area will be washed with soap.  You will be asked to lie on your side with your knees bent.  Your doctor will get a long, thin, flexible tube ready. The tube will have a camera and a light on the end.  The tube will be put into your anus.  The tube will be gently put into your large intestine.  Air will be delivered into your large intestine to keep it open. You may feel some pressure or cramping.  The camera will be used to take photos.  A small tissue sample may be removed for testing (biopsy).  If small growths are found, your doctor may remove them and have them checked for cancer.  The tube that was put into your anus will be slowly removed. The procedure may vary among doctors and hospitals. What happens after the procedure?  Your doctor will check on you often until the medicines you were given have worn off.  Do not drive for 24 hours after the procedure.  You may have a small amount of blood in your poop.  You may pass gas.  You may have mild cramps or bloating in your belly (abdomen).  It is up to you to get the results of your procedure. Ask your doctor, or the department performing the procedure, when your results will be ready. Summary  A colonoscopy is an exam to look at the large intestine.  Follow instructions from your doctor about eating and drinking before the procedure.  If you were prescribed an oral bowel prep to clean out your colon, take it as told by your doctor.  Your doctor will check on you often until the medicines you were given have worn off.  Plan to have someone take you home from the hospital or clinic. This information is not intended to replace advice given to you by your health care provider. Make sure you discuss any questions you have with your health care provider. Document Released: 07/13/2010 Document Revised: 04/09/2017 Document  Reviewed: 08/22/2015 Elsevier Interactive Patient Education  2019 [employer]  Colonoscopy, Adult, Care After This sheet gives you information about how to care for yourself after your procedure. Your health care provider may also give you more specific instructions. If you have problems or questions, contact your health care provider. What can I expect after the procedure? After the procedure, it is common to have:  A small amount of blood in your stool for 24 hours after the procedure.  Some gas.  Mild abdominal cramping or bloating. Follow these instructions at home: General instructions  For the first 24 hours after the procedure: ?  Do not drive or use machinery. ? Do not sign important documents. ? Do not drink alcohol. ? Do your regular daily activities at a slower pace than normal. ? Eat soft, easy-to-digest foods.  Take over-the-counter or prescription medicines only as told by your health care provider. Relieving cramping and bloating   Try walking around when you have cramps or feel bloated.  Apply heat to your abdomen as told by your health care provider. Use a heat source that your health care provider recommends, such as a moist heat pack or a heating pad. ? Place a towel between your skin and the heat source. ? Leave the heat on for 20-30 minutes. ? Remove the heat if your skin turns bright red. This is especially important if you are unable to feel pain, heat, or cold. You may have a greater risk of getting burned. Eating and drinking   Drink enough fluid to keep your urine pale yellow.  Resume your normal diet as instructed by your health care provider. Avoid heavy or fried foods that are hard to digest.  Avoid drinking alcohol for as long as instructed by your health care provider. Contact a health care provider if:  You have blood in your stool 2-3 days after the procedure. Get help right away if:  You have more than a small spotting of blood in your  stool.  You pass large blood clots in your stool.  Your abdomen is swollen.  You have nausea or vomiting.  You have a fever.  You have increasing abdominal pain that is not relieved with medicine. Summary  After the procedure, it is common to have a small amount of blood in your stool. You may also have mild abdominal cramping and bloating.  For the first 24 hours after the procedure, do not drive or use machinery, sign important documents, or drink alcohol.  Contact your health care provider if you have a lot of blood in your stool, nausea or vomiting, a fever, or increased abdominal pain. This information is not intended to replace advice given to you by your health care provider. Make sure you discuss any questions you have with your health care provider. Document Released: 01/23/2004 Document Revised: 04/02/2017 Document Reviewed: 08/22/2015 Elsevier Interactive Patient Education  2019 [place] Anesthesia is a term that refers to techniques, procedures, and medicines that help a person stay safe and comfortable during a medical procedure. Monitored anesthesia care, or sedation, is one type of anesthesia. Your anesthesia specialist may recommend sedation if you will be having a procedure that does not require you to be unconscious, such as:  Cataract surgery.  A dental procedure.  A biopsy.  A colonoscopy. During the procedure, you may receive a medicine to help you relax (sedative). There are three levels of sedation:  Mild sedation. At this level, you may feel awake and relaxed. You will be able to follow directions.  Moderate sedation. At this level, you will be sleepy. You may not remember the procedure.  Deep sedation. At this level, you will be asleep. You will not remember the procedure. The more medicine you are given, the deeper your level of sedation will be. Depending on how you respond to the procedure, the anesthesia specialist  may change your level of sedation or the type of anesthesia to fit your needs. An anesthesia specialist will monitor you closely during the procedure. Let your health care provider know about:  Any allergies you have.  All  medicines you are taking, including vitamins, herbs, eye drops, creams, and over-the-counter medicines.  Any use of steroids (by mouth or as a cream).  Any problems you or family members have had with sedatives and anesthetic medicines.  Any blood disorders you have.  Any surgeries you have had.  Any medical conditions you have, such as sleep apnea.  Whether you are pregnant or may be pregnant.  Any use of cigarettes, alcohol, or street drugs. What are the risks? Generally, this is a safe procedure. However, problems may occur, including:  Getting too much medicine (oversedation).  Nausea.  Allergic reaction to medicines.  Trouble breathing. If this happens, a breathing tube may be used to help with breathing. It will be removed when you are awake and breathing on your own.  Heart trouble.  Lung trouble. Before the procedure Staying hydrated Follow instructions from your health care provider about hydration, which may include:  Up to 2 hours before the procedure - you may continue to drink clear liquids, such as water, clear fruit juice, black coffee, and plain tea. Eating and drinking restrictions Follow instructions from your health care provider about eating and drinking, which may include:  8 hours before the procedure - stop eating heavy meals or foods such as meat, fried foods, or fatty foods.  6 hours before the procedure - stop eating light meals or foods, such as toast or cereal.  6 hours before the procedure - stop drinking milk or drinks that contain milk.  2 hours before the procedure - stop drinking clear liquids. Medicines Ask your health care provider about:  Changing or stopping your regular medicines. This is especially important  if you are taking diabetes medicines or blood thinners.  Taking medicines such as aspirin and ibuprofen. These medicines can thin your blood. Do not take these medicines before your procedure if your health care provider instructs you not to. Tests and exams  You will have a physical exam.  You may have blood tests done to show: ? How well your kidneys and liver are working. ? How well your blood can clot. General instructions  Plan to have someone take you home from the hospital or clinic.  If you will be going home right after the procedure, plan to have someone with you for 24 hours.  What happens during the procedure?  Your blood pressure, heart rate, breathing, level of pain and overall condition will be monitored.  An IV tube will be inserted into one of your veins.  Your anesthesia specialist will give you medicines as needed to keep you comfortable during the procedure. This may mean changing the level of sedation.  The procedure will be performed. After the procedure  Your blood pressure, heart rate, breathing rate, and blood oxygen level will be monitored until the medicines you were given have worn off.  Do not drive for 24 hours if you received a sedative.  You may: ? Feel sleepy, clumsy, or nauseous. ? Feel forgetful about what happened after the procedure. ? Have a sore throat if you had a breathing tube during the procedure. ? Vomit. This information is not intended to replace advice given to you by your health care provider. Make sure you discuss any questions you have with your health care provider. Document Released: 03/06/2005 Document Revised: 11/17/2015 Document Reviewed: 10/01/2015 Elsevier Interactive Patient Education  2019 [place], Care After These instructions provide you with information about caring for yourself after your procedure. Your  health care provider may also give you more specific instructions. Your  treatment has been planned according to current medical practices, but problems sometimes occur. Call your health care provider if you have any problems or questions after your procedure. What can I expect after the procedure? After your procedure, you may:  Feel sleepy for several hours.  Feel clumsy and have poor balance for several hours.  Feel forgetful about what happened after the procedure.  Have poor judgment for several hours.  Feel nauseous or vomit.  Have a sore throat if you had a breathing tube during the procedure. Follow these instructions at home: For at least 24 hours after the procedure:      Have a responsible adult stay with you. It is important to have someone help care for you until you are awake and alert.  Rest as needed.  Do not: ? Participate in activities in which you could fall or become injured. ? Drive. ? Use heavy machinery. ? Drink alcohol. ? Take sleeping pills or medicines that cause drowsiness. ? Make important decisions or sign legal documents. ? Take care of children on your own. Eating and drinking  Follow the diet that is recommended by your health care provider.  If you vomit, drink water, juice, or soup when you can drink without vomiting.  Make sure you have little or no nausea before eating solid foods. General instructions  Take over-the-counter and prescription medicines only as told by your health care provider.  If you have sleep apnea, surgery and certain medicines can increase your risk for breathing problems. Follow instructions from your health care provider about wearing your sleep device: ? Anytime you are sleeping, including during daytime naps. ? While taking prescription pain medicines, sleeping medicines, or medicines that make you drowsy.  If you smoke, do not smoke without supervision.  Keep all follow-up visits as told by your health care provider. This is important. Contact a health care provider  if:  You keep feeling nauseous or you keep vomiting.  You feel light-headed.  You develop a rash.  You have a fever. Get help right away if:  You have trouble breathing. Summary  For several hours after your procedure, you may feel sleepy and have poor judgment.  Have a responsible adult stay with you for at least 24 hours or until you are awake and alert. This information is not intended to replace advice given to you by your health care provider. Make sure you discuss any questions you have with your health care provider. Document Released: 10/01/2015 Document Revised: 01/24/2017 Document Reviewed: 10/01/2015 Elsevier Interactive Patient Education  2019 [employer].

## 2018-07-16 ENCOUNTER — Encounter (HOSPITAL_COMMUNITY): Payer: Self-pay

## 2018-07-16 ENCOUNTER — Ambulatory Visit (HOSPITAL_COMMUNITY)
Admission: RE | Admit: 2018-07-16 | Discharge: 2018-07-16 | Disposition: A | Discharge status: Home / Self Care | Payer: Medicare Other | Source: Ambulatory Visit | Attending: Internal Medicine | Admitting: Internal Medicine

## 2018-07-16 ENCOUNTER — Other Ambulatory Visit: Payer: Self-pay

## 2018-07-16 DIAGNOSIS — Z01812 Encounter for preprocedural laboratory examination: Secondary | ICD-10-CM | POA: Insufficient documentation

## 2018-07-16 LAB — BASIC METABOLIC PANEL
Anion gap: 11 (ref 5–15)
BUN: 10 mg/dL (ref 8–23)
CO2: 19 mmol/L — ABNORMAL LOW (ref 22–32)
Calcium: 9.7 mg/dL (ref 8.9–10.3)
Chloride: 110 mmol/L (ref 98–111)
Creatinine, Ser: 0.83 mg/dL (ref 0.44–1.00)
GFR calc Af Amer: >60 mL/min (ref >60)
GFR calc non Af Amer: >60 mL/min (ref >60)
Glucose, Bld: 92 mg/dL (ref 70–99)
Potassium: 4.1 mmol/L (ref 3.5–5.1)
Sodium: 140 mmol/L (ref 135–145)

## 2018-07-16 LAB — CBC
HCT: 42.6 % (ref 36.0–46.0)
Hemoglobin: 12.7 g/dL (ref 12.0–15.0)
MCH: 25.4 pg — ABNORMAL LOW (ref 26.0–34.0)
MCHC: 29.8 g/dL — ABNORMAL LOW (ref 30.0–36.0)
MCV: 85.2 fL (ref 80.0–100.0)
Platelets: 196 10*3/uL (ref 150–400)
RBC: 5 MIL/uL (ref 3.87–5.11)
RDW: 17.1 % — ABNORMAL HIGH (ref 11.5–15.5)
WBC: 5.4 10*3/uL (ref 4.0–10.5)
nRBC: 0 % (ref 0.0–0.2)

## 2018-07-23 ENCOUNTER — Ambulatory Visit (HOSPITAL_COMMUNITY)
Admission: RE | Admit: 2018-07-23 | Discharge: 2018-07-23 | Disposition: A | Discharge status: Home / Self Care | Payer: Medicare Other | Attending: Internal Medicine | Admitting: Internal Medicine

## 2018-07-23 ENCOUNTER — Ambulatory Visit (HOSPITAL_COMMUNITY): Payer: Medicare Other | Admitting: Anesthesiology

## 2018-07-23 ENCOUNTER — Encounter (HOSPITAL_COMMUNITY)
Admission: RE | Disposition: A | Discharge status: Home / Self Care | Payer: Self-pay | Source: Home / Self Care | Attending: Internal Medicine

## 2018-07-23 ENCOUNTER — Encounter (HOSPITAL_COMMUNITY): Payer: Self-pay | Admitting: *Deleted

## 2018-07-23 DIAGNOSIS — J45909 Unspecified asthma, uncomplicated: Secondary | ICD-10-CM | POA: Insufficient documentation

## 2018-07-23 DIAGNOSIS — K219 Gastro-esophageal reflux disease without esophagitis: Secondary | ICD-10-CM | POA: Insufficient documentation

## 2018-07-23 DIAGNOSIS — K5909 Other constipation: Secondary | ICD-10-CM | POA: Insufficient documentation

## 2018-07-23 DIAGNOSIS — Z09 Encounter for follow-up examination after completed treatment for conditions other than malignant neoplasm: Secondary | ICD-10-CM | POA: Insufficient documentation

## 2018-07-23 DIAGNOSIS — M797 Fibromyalgia: Secondary | ICD-10-CM | POA: Diagnosis not present

## 2018-07-23 DIAGNOSIS — Z7951 Long term (current) use of inhaled steroids: Secondary | ICD-10-CM | POA: Diagnosis not present

## 2018-07-23 DIAGNOSIS — K640 First degree hemorrhoids: Secondary | ICD-10-CM | POA: Diagnosis not present

## 2018-07-23 DIAGNOSIS — E785 Hyperlipidemia, unspecified: Secondary | ICD-10-CM | POA: Diagnosis not present

## 2018-07-23 DIAGNOSIS — F419 Anxiety disorder, unspecified: Secondary | ICD-10-CM | POA: Insufficient documentation

## 2018-07-23 DIAGNOSIS — G4730 Sleep apnea, unspecified: Secondary | ICD-10-CM | POA: Diagnosis not present

## 2018-07-23 DIAGNOSIS — F209 Schizophrenia, unspecified: Secondary | ICD-10-CM | POA: Insufficient documentation

## 2018-07-23 DIAGNOSIS — F329 Major depressive disorder, single episode, unspecified: Secondary | ICD-10-CM | POA: Insufficient documentation

## 2018-07-23 DIAGNOSIS — I10 Essential (primary) hypertension: Secondary | ICD-10-CM | POA: Insufficient documentation

## 2018-07-23 DIAGNOSIS — Z8249 Family history of ischemic heart disease and other diseases of the circulatory system: Secondary | ICD-10-CM | POA: Insufficient documentation

## 2018-07-23 DIAGNOSIS — D123 Benign neoplasm of transverse colon: Secondary | ICD-10-CM | POA: Insufficient documentation

## 2018-07-23 DIAGNOSIS — G43909 Migraine, unspecified, not intractable, without status migrainosus: Secondary | ICD-10-CM | POA: Insufficient documentation

## 2018-07-23 DIAGNOSIS — M549 Dorsalgia, unspecified: Secondary | ICD-10-CM | POA: Insufficient documentation

## 2018-07-23 DIAGNOSIS — D126 Benign neoplasm of colon, unspecified: Secondary | ICD-10-CM

## 2018-07-23 DIAGNOSIS — G4700 Insomnia, unspecified: Secondary | ICD-10-CM | POA: Diagnosis not present

## 2018-07-23 DIAGNOSIS — K5730 Diverticulosis of large intestine without perforation or abscess without bleeding: Secondary | ICD-10-CM | POA: Diagnosis not present

## 2018-07-23 DIAGNOSIS — Z86010 Personal history of colonic polyps: Secondary | ICD-10-CM | POA: Diagnosis not present

## 2018-07-23 DIAGNOSIS — G8929 Other chronic pain: Secondary | ICD-10-CM | POA: Insufficient documentation

## 2018-07-23 DIAGNOSIS — Z7984 Long term (current) use of oral hypoglycemic drugs: Secondary | ICD-10-CM | POA: Insufficient documentation

## 2018-07-23 DIAGNOSIS — Z79899 Other long term (current) drug therapy: Secondary | ICD-10-CM | POA: Diagnosis not present

## 2018-07-23 HISTORY — PX: COLONOSCOPY WITH PROPOFOL: SHX5780

## 2018-07-23 HISTORY — PX: POLYPECTOMY: SHX5525

## 2018-07-23 LAB — GLUCOSE, CAPILLARY: Glucose-Capillary: 106 mg/dL — ABNORMAL HIGH (ref 70–99)

## 2018-07-23 SURGERY — COLONOSCOPY WITH PROPOFOL
Anesthesia: Monitor Anesthesia Care

## 2018-07-23 MED ORDER — PROPOFOL 10 MG/ML IV BOLUS
INTRAVENOUS | Status: AC
  Filled 2018-07-23: qty 80

## 2018-07-23 MED ORDER — PROMETHAZINE HCL 25 MG/ML IJ SOLN: 6.2500 mg | INTRAMUSCULAR | Status: DC | PRN

## 2018-07-23 MED ORDER — MEPERIDINE HCL 100 MG/ML IJ SOLN: 6.2500 mg | INTRAMUSCULAR | Status: DC | PRN

## 2018-07-23 MED ORDER — CHLORHEXIDINE GLUCONATE CLOTH 2 % EX PADS: 6.0000 | MEDICATED_PAD | Freq: Once | CUTANEOUS | Status: DC

## 2018-07-23 MED ORDER — LACTATED RINGERS IV SOLN: INTRAVENOUS | Status: DC

## 2018-07-23 MED ORDER — LACTATED RINGERS IV SOLN
INTRAVENOUS | Status: DC
  Administered 2018-07-23: 1000 mL via INTRAVENOUS

## 2018-07-23 MED ORDER — PROPOFOL 500 MG/50ML IV EMUL
INTRAVENOUS | Status: DC | PRN
  Administered 2018-07-23: 75 ug/kg/min via INTRAVENOUS
  Administered 2018-07-23: 150 ug/kg/min via INTRAVENOUS

## 2018-07-23 MED ORDER — PROPOFOL 10 MG/ML IV BOLUS
INTRAVENOUS | Status: DC | PRN
  Administered 2018-07-23: 20 mg via INTRAVENOUS
  Administered 2018-07-23: 60 mg via INTRAVENOUS
  Administered 2018-07-23: 40 mg via INTRAVENOUS

## 2018-07-23 MED ORDER — HYDROCODONE-ACETAMINOPHEN 7.5-325 MG PO TABS: 1.0000 | ORAL_TABLET | Freq: Once | ORAL | Status: DC | PRN

## 2018-07-23 NOTE — H&P (Signed)
_0 @   Primary Care Physician:  Andrey Campanile, MD Primary Gastroenterologist:  Dr. Gala Romney  Pre-Procedure History & Physical: HPI:  [name] is a 73 y.o. female here for a surveillance colonoscopy.  History of colonic adenomas.  Attempted colonoscopy last year but prep was inadequate.  Past Medical History:  Diagnosis Date  . Adenomatous colon polyp 2006   excised in 2006 & 2010Due surveillance 06/2013  . Anxiety   . Anxiety and depression   . Asthma   . Breast mass    right nipple bengn mass per patient  . Breast tumor   . Chest discomfort   . Chronic back pain   . Chronic constipation   . Complication of anesthesia   . Depression   . Diverticulosis   . Fibromyalgia   . Gastroesophageal reflux disease   . Hiatal hernia   . History of benign esophageal tumor 2010   granular cell esophageal tumor (Dx 06/2008), resected via EMR 2011, due repeat EGD 02/2012  . Hyperlipidemia   . Hypertension   . Insomnia   . Migraines   . PONV (postoperative nausea and vomiting)   . Psychosis ([place])   . Sleep apnea    Stop Bang score of 5. Pt said she was told by Dr. Merlene Laughter that she had "a little bit" of sleep apena, but not bad enough to treat.  . Thyroid nodule   . Tumor of esophagus     Past Surgical History:  Procedure Laterality Date  . ABDOMINAL HYSTERECTOMY    . BRAVO [place] STUDY  12/11/2011   Procedure: BRAVO [place];  Surgeon: Daneil Dolin, MD;  Location: [facility];  Service: Endoscopy;  Laterality: N/A;  . 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 CHOLECYSTECTOMY N/A 04/09/2013   Procedure: LAPAROSCOPIC CHOLECYSTECTOMY;  Surgeon: Jamesetta So, MD;  Location: [facility] ORS;  Service: General;  Laterality: N/A;  . COLONOSCOPY  06/2008, 06/2011   sigmoid tics, tubular adenoma; 2013: anal canal hemorrhoids  . COLONOSCOPY  07/10/2011   Anal canal hemorrhoids likely the cause of hematochezia in the setting of constipation;  otherwise normal rectum ;submucosal  petechiae in left colon of doubtful clinical significance; otherwise, normal colon  . COLONOSCOPY WITH PROPOFOL N/A 11/10/2017   cancelled in pre-op  . ESOPHAGEAL DILATION N/A 05/08/2015   Procedure: ESOPHAGEAL DILATION;  Surgeon: Daneil Dolin, MD;  Location: [facility];  Service: Endoscopy;  Laterality: N/AVenia Minks 54/56  . ESOPHAGOGASTRODUODENOSCOPY  12/11/2011   QIO:[id number] Schatzki's ring; otherwise normal/Small hiatal hernia. Antral and bulbar erosions  . ESOPHAGOGASTRODUODENOSCOPY (EGD) WITH PROPOFOL N/A 05/08/2015   Dr. Gala Romney: mild erosive reflux esophagitis, non-critical Schatzki's ring s/p dilation. Hiatal hernia.   . EUS  08/2010   Dr [facility] with EGD. Retained food. No recurrent esophageal lesion, bx negative.  Marland Kitchen LUNG BIOPSY     negative  . PARATHYROIDECTOMY  April 2017   [facility].   Marland Kitchen RIGHT OOPHORECTOMY     benign disease  . SHOULDER ARTHROSCOPY     Right; bone spurs removed  . TONSILLECTOMY    . TOTAL KNEE ARTHROPLASTY  2002   Right; previous arthroscopic surgery    Prior to Admission medications   Medication Sig Start Date End Date Taking? Authorizing Provider  albuterol (PROVENTIL HFA;VENTOLIN HFA) 108 (90 BASE) MCG/ACT inhaler Inhale 2 puffs into the lungs every 6 (six) hours as needed for wheezing or shortness of breath.    Yes [provider]  amLODipine (NORVASC) 10 MG tablet Take 10 mg by mouth daily.  12/18/15  Yes [provider]  atenolol (TENORMIN) 100 MG tablet Take 100 mg by mouth daily.   Yes [provider]  donepezil (ARICEPT) 10 MG tablet Take 10 mg by mouth at bedtime.   Yes [provider]  fluticasone (FLONASE) 50 MCG/ACT nasal spray Place 1 spray into both nostrils daily as needed for allergies.    Yes [provider]  gabapentin (NEURONTIN) 300 MG capsule Take 300 mg by mouth at bedtime.    Yes [provider]  HYDROcodone-acetaminophen (NORCO/VICODIN) 5-325 MG  tablet Take 1 tablet by mouth daily as needed for moderate pain.    Yes [provider]  lovastatin (MEVACOR) 40 MG tablet Take 40 mg by mouth at bedtime.   Yes [provider]  Melatonin 3 MG TABS Take 9 mg by mouth at bedtime.    Yes [provider]  memantine (NAMENDA) 10 MG tablet Take 10 mg by mouth 2 (two) times daily.  10/20/17  Yes [provider]  metFORMIN (GLUCOPHAGE-XR) 500 MG 24 hr tablet Take 1,000 mg by mouth 2 (two) times daily.    Yes [provider]  Na Sulfate-K Sulfate-Mg Sulf (SUPREP BOWEL PREP KIT) 17.5-3.13-1.6 GM/177ML SOLN Take 1 kit by mouth as directed. 06/01/18  Yes Ancel Easler, Cristopher Estimable, MD  naloxegol oxalate (MOVANTIK) 25 MG TABS tablet Take 1 tablet (25 mg total) by mouth daily. Take 1 hour before breakfast. 06/01/18  Yes [name], NP  OLANZapine (ZYPREXA) 7.5 MG tablet Take 7.5 mg by mouth at bedtime.   Yes [provider]  RABEprazole (ACIPHEX) 20 MG tablet Take 1 tablet (20 mg total) by mouth daily before breakfast. 03/23/18  Yes [name], NP  spironolactone (ALDACTONE) 25 MG tablet Take 25 mg by mouth daily. 12/18/15  Yes [provider]  topiramate (TOPAMAX) 100 MG tablet Take 100 mg by mouth daily.    Yes [provider]  traZODone (DESYREL) 100 MG tablet Take 300 mg by mouth at bedtime.  07/31/17  Yes [provider]  venlafaxine XR (EFFEXOR-XR) 150 MG 24 hr capsule Take 150 mg by mouth daily with breakfast.    Yes [provider]  venlafaxine XR (EFFEXOR-XR) 75 MG 24 hr capsule Take 75 mg by mouth daily with breakfast.    Yes [provider]  Vitamin D, Ergocalciferol, (DRISDOL) 50000 units CAPS capsule Take 50,000 Units by mouth every Wednesday.    Yes [provider]    Allergies as of 06/01/2018 - Review Complete 06/01/2018  Allergen Reaction Noted  . Elavil [amitriptyline] Other (See Comments) 06/03/2012  . Abilify [aripiprazole] Other (See Comments)  11/25/2012  . Codeine Hives, Nausea Only, and Other (See Comments)   . Latex Hives 11/21/2011  . Penicillin v Itching 08/02/2013  . Penicillins Hives   . Polyethylene glycol Other (See Comments) 12/26/2012  . Sulfonamide derivatives Nausea And Vomiting   . Cymbalta [duloxetine hcl] Rash 03/10/2013  . Remeron [mirtazapine] Other (See Comments) 05/06/2012    Family History  Problem Relation Age of Onset  . Stroke Father   . Alcohol abuse Father   . Heart attack Mother   . Depression Mother   . Anxiety disorder Mother   . Colon cancer Paternal Aunt   . Colon cancer Paternal Uncle   . Dementia Maternal Uncle   . ADD / ADHD Neg Hx   . Bipolar disorder Neg  Hx   . Drug abuse Neg Hx   . OCD Neg Hx   . Paranoid behavior Neg Hx   . Schizophrenia Neg Hx   . Seizures Neg Hx   . Sexual abuse Neg Hx   . Physical abuse Neg Hx     Social History   Socioeconomic History  . Marital status: Single    Spouse name: Not on file  . Number of children: 1  . Years of education: Not on file  . Highest education level: Not on file  Occupational History  . Occupation: disabled    Employer: RETIRED  Social Needs  . Financial resource strain: Not on file  . Food insecurity:    Worry: Not on file    Inability: Not on file  . Transportation needs:    Medical: Not on file    Non-medical: Not on file  Tobacco Use  . Smoking status: Never Smoker  . Smokeless tobacco: Never Used  Substance and Sexual Activity  . Alcohol use: No    Alcohol/week: 0.0 standard drinks  . Drug use: No  . Sexual activity: Never  Lifestyle  . Physical activity:    Days per week: Not on file    Minutes per session: Not on file  . Stress: Not on file  Relationships  . Social connections:    Talks on phone: Not on file    Gets together: Not on file    Attends religious service: Not on file    Active member of club or organization: Not on file    Attends meetings of clubs or organizations: Not on file     Relationship status: Not on file  . Intimate partner violence:    Fear of current or ex partner: Not on file    Emotionally abused: Not on file    Physically abused: Not on file    Forced sexual activity: Not on file  Other Topics Concern  . Not on file  Social History Narrative  . Not on file    Review of Systems: See HPI, otherwise negative ROS  Physical Exam: BP (!) 149/78   Temp 98.4 F (36.9 C) (Oral)   Resp 13   SpO2 97%  General:   Alert,  Well-developed, well-nourished, pleasant and cooperative in NAD Neck:  Supple; no masses or thyromegaly. No significant cervical adenopathy. Lungs:  Clear throughout to auscultation.   No wheezes, crackles, or rhonchi. No acute distress. Heart:  Regular rate and rhythm; no murmurs, clicks, rubs,  or gallops. Abdomen: Non-distended, normal bowel sounds.  Soft and nontender without appreciable mass or hepatosplenomegaly.  Pulses:  Normal pulses noted. Extremities:  Without clubbing or edema.  Impression/Plan: Pleasant 73 year old lady here for surveillance colonoscopy.  History of colonic adenoma.  The risks, benefits, limitations, alternatives and imponderables have been reviewed with the patient. Questions have been answered. All parties are agreeable.      Notice: This dictation was prepared with Dragon dictation along with smaller phrase technology. Any transcriptional errors that result from this process are unintentional and may not be corrected upon review.

## 2018-07-23 NOTE — Transfer of Care (Signed)
Immediate Anesthesia Transfer of Care Note  Patient: [name]  Procedure(s) Performed: COLONOSCOPY WITH PROPOFOL (N/A ) POLYPECTOMY  Patient Location: PACU  Anesthesia Type:MAC  Level of Consciousness: awake, alert  and patient cooperative  Airway & Oxygen Therapy: Patient Spontanous Breathing  Post-op Assessment: Report given to RN and Post -op Vital signs reviewed and stable  Post vital signs: Reviewed and stable  Last Vitals:  Vitals Value Taken Time  BP    Temp    Pulse 30 07/23/2018  9:09 AM  Resp 16 07/23/2018  9:09 AM  SpO2 79 % 07/23/2018  9:09 AM  Vitals shown include unvalidated device data.  Last Pain:  Vitals:   07/23/18 0845  TempSrc:   PainSc: 0-No pain      Patients Stated Pain Goal: 6 (95/32/02 3343)  Complications: No apparent anesthesia complications

## 2018-07-23 NOTE — Anesthesia Preprocedure Evaluation (Addendum)
Anesthesia Evaluation    Airway Mallampati: II       Dental  (+) Dental Advidsory Given   Pulmonary asthma , sleep apnea ,    breath sounds clear to auscultation       Cardiovascular hypertension, On Medications  Rhythm:regular     Neuro/Psych  Headaches, Anxiety Depression Schizophrenia  Neuromuscular disease    GI/Hepatic hiatal hernia, GERD  ,  Endo/Other    Renal/GU      Musculoskeletal   Abdominal   Peds  Hematology   Anesthesia Other Findings   Reproductive/Obstetrics                            Anesthesia Physical Anesthesia Plan  ASA: III  Anesthesia Plan: MAC   Post-op Pain Management:    Induction:   PONV Risk Score and Plan:   Airway Management Planned:   Additional Equipment:   Intra-op Plan:   Post-operative Plan:   Informed Consent: I have reviewed the patients History and Physical, chart, labs and discussed the procedure including the risks, benefits and alternatives for the proposed anesthesia with the patient or authorized representative who has indicated his/her understanding and acceptance.       Plan Discussed with: Anesthesiologist  Anesthesia Plan Comments:         Anesthesia Quick Evaluation

## 2018-07-23 NOTE — Discharge Instructions (Signed)
Colonoscopy Discharge Instructions  Read the instructions outlined below and refer to this sheet in the next few weeks. These discharge instructions provide you with general information on caring for yourself after you leave the hospital. Your doctor may also give you specific instructions. While your treatment has been planned according to the most current medical practices available, unavoidable complications occasionally occur. If you have any problems or questions after discharge, call Dr. Gala Romney at [phone]. ACTIVITY  You may resume your regular activity, but move at a slower pace for the next 24 hours.   Take frequent rest periods for the next 24 hours.   Walking will help get rid of the air and reduce the bloated feeling in your belly (abdomen).   No driving for 24 hours (because of the medicine (anesthesia) used during the test).    Do not sign any important legal documents or operate any machinery for 24 hours (because of the anesthesia used during the test).  NUTRITION  Drink plenty of fluids.   You may resume your normal diet as instructed by your doctor.   Begin with a light meal and progress to your normal diet. Heavy or fried foods are harder to digest and may make you feel sick to your stomach (nauseated).   Avoid alcoholic beverages for 24 hours or as instructed.  MEDICATIONS  You may resume your normal medications unless your doctor tells you otherwise.  WHAT YOU CAN EXPECT TODAY  Some feelings of bloating in the abdomen.   Passage of more gas than usual.   Spotting of blood in your stool or on the toilet paper.  IF YOU HAD POLYPS REMOVED DURING THE COLONOSCOPY:  No aspirin products for 7 days or as instructed.   No alcohol for 7 days or as instructed.   Eat a soft diet for the next 24 hours.  FINDING OUT THE RESULTS OF YOUR TEST Not all test results are available during your visit. If your test results are not back during the visit, make an appointment  with your caregiver to find out the results. Do not assume everything is normal if you have not heard from your caregiver or the medical facility. It is important for you to follow up on all of your test results.  SEEK IMMEDIATE MEDICAL ATTENTION IF:  You have more than a spotting of blood in your stool.   Your belly is swollen (abdominal distention).   You are nauseated or vomiting.   You have a temperature over 101.   You have abdominal pain or discomfort that is severe or gets worse throughout the day.    Diverticulosis and colon polyp information provided  Further recommendations to follow pending review of pathology report       Diverticulosis  Diverticulosis is a condition that develops when small pouches (diverticula) form in the wall of the large intestine (colon). The colon is where water is absorbed and stool is formed. The pouches form when the inside layer of the colon pushes through weak spots in the outer layers of the colon. You may have a few pouches or many of them. What are the causes? The cause of this condition is not known. What increases the risk? The following factors may make you more likely to develop this condition:  Being older than age 73. Your risk for this condition increases with age. Diverticulosis is rare among people younger than age 73. By age 73, many people have it.  Eating a low-fiber diet.  Having  frequent constipation.  Being overweight.  Not getting enough exercise.  Smoking.  Taking over-the-counter pain medicines, like aspirin and ibuprofen.  Having a family history of diverticulosis. What are the signs or symptoms? In most people, there are no symptoms of this condition. If you do have symptoms, they may include:  Bloating.  Cramps in the abdomen.  Constipation or diarrhea.  Pain in the lower left side of the abdomen. How is this diagnosed? This condition is most often diagnosed during an exam for other colon  problems. Because diverticulosis usually has no symptoms, it often cannot be diagnosed independently. This condition may be diagnosed by:  Using a flexible scope to examine the colon (colonoscopy).  Taking an X-ray of the colon after dye has been put into the colon (barium enema).  Doing a CT scan. How is this treated? You may not need treatment for this condition if you have never developed an infection related to diverticulosis. If you have had an infection before, treatment may include:  Eating a high-fiber diet. This may include eating more fruits, vegetables, and grains.  Taking a fiber supplement.  Taking a live bacteria supplement (probiotic).  Taking medicine to relax your colon.  Taking antibiotic medicines. Follow these instructions at home:  Drink 6-8 glasses of water or more each day to prevent constipation.  Try not to strain when you have a bowel movement.  If you have had an infection before: ? Eat more fiber as directed by your health care provider or your diet and nutrition specialist (dietitian). ? Take a fiber supplement or probiotic, if your health care provider approves.  Take over-the-counter and prescription medicines only as told by your health care provider.  If you were prescribed an antibiotic, take it as told by your health care provider. Do not stop taking the antibiotic even if you start to feel better.  Keep all follow-up visits as told by your health care provider. This is important. Contact a health care provider if:  You have pain in your abdomen.  You have bloating.  You have cramps.  You have not had a bowel movement in 3 days. Get help right away if:  Your pain gets worse.  Your bloating becomes very bad.  You have a fever or chills, and your symptoms suddenly get worse.  You vomit.  You have bowel movements that are bloody or black.  You have bleeding from your rectum. Summary  Diverticulosis is a condition that develops  when small pouches (diverticula) form in the wall of the large intestine (colon).  You may have a few pouches or many of them.  This condition is most often diagnosed during an exam for other colon problems.  If you have had an infection related to diverticulosis, treatment may include increasing the fiber in your diet, taking supplements, or taking medicines. This information is not intended to replace advice given to you by your health care provider. Make sure you discuss any questions you have with your health care provider. Document Released: 03/07/2004 Document Revised: 04/29/2016 Document Reviewed: 04/29/2016 Elsevier Interactive Patient Education  2019 [place].    Colon Polyps  Polyps are tissue growths inside the body. Polyps can grow in many places, including the large intestine (colon). A polyp may be a round bump or a mushroom-shaped growth. You could have one polyp or several. Most colon polyps are noncancerous (benign). However, some colon polyps can become cancerous over time. Finding and removing the polyps early can help prevent  this. What are the causes? The exact cause of colon polyps is not known. What increases the risk? You are more likely to develop this condition if you:  Have a family history of colon cancer or colon polyps.  Are older than 56 or older than 45 if you are African American.  Have inflammatory bowel disease, such as ulcerative colitis or Crohn's disease.  Have certain hereditary conditions, such as: ? Familial adenomatous polyposis. ? Lynch syndrome. ? Turcot syndrome. ? Peutz-Jeghers syndrome.  Are overweight.  Smoke cigarettes.  Do not get enough exercise.  Drink too much alcohol.  Eat a diet that is high in fat and red meat and low in fiber.  Had childhood cancer that was treated with abdominal radiation. What are the signs or symptoms? Most polyps do not cause symptoms. If you have symptoms, they may include:  Blood  coming from your rectum when having a bowel movement.  Blood in your stool. The stool may look dark red or black.  Abdominal pain.  A change in bowel habits, such as constipation or diarrhea. How is this diagnosed? This condition is diagnosed with a colonoscopy. This is a procedure in which a lighted, flexible scope is inserted into the anus and then passed into the colon to examine the area. Polyps are sometimes found when a colonoscopy is done as part of routine cancer screening tests. How is this treated? Treatment for this condition involves removing any polyps that are found. Most polyps can be removed during a colonoscopy. Those polyps will then be tested for cancer. Additional treatment may be needed depending on the results of testing. Follow these instructions at home: Lifestyle  Maintain a healthy weight, or lose weight if recommended by your health care provider.  Exercise every day or as told by your health care provider.  Do not use any products that contain nicotine or tobacco, such as cigarettes and e-cigarettes. If you need help quitting, ask your health care provider.  If you drink alcohol, limit how much you have: ? 0-1 drink a day for women. ? 0-2 drinks a day for men.  Be aware of how much alcohol is in your drink. In the [place] one drink equals one 12 oz bottle of beer (355 mL), one 5 oz glass of wine (148 mL), or one 1 oz shot of hard liquor (44 mL). Eating and drinking   Eat foods that are high in fiber, such as fruits, vegetables, and whole grains.  Eat foods that are high in calcium and vitamin D, such as milk, cheese, yogurt, eggs, liver, fish, and broccoli.  Limit foods that are high in fat, such as fried foods and desserts.  Limit the amount of red meat and processed meat you eat, such as hot dogs, sausage, bacon, and lunch meats. General instructions  Keep all follow-up visits as told by your health care provider. This is important. ? This includes  having regularly scheduled colonoscopies. ? Talk to your health care provider about when you need a colonoscopy. Contact a health care provider if:  You have new or worsening bleeding during a bowel movement.  You have new or increased blood in your stool.  You have a change in bowel habits.  You lose weight for no known reason. Summary  Polyps are tissue growths inside the body. Polyps can grow in many places, including the colon.  Most colon polyps are noncancerous (benign), but some can become cancerous over time.  This condition is diagnosed with  a colonoscopy.  Treatment for this condition involves removing any polyps that are found. Most polyps can be removed during a colonoscopy. This information is not intended to replace advice given to you by your health care provider. Make sure you discuss any questions you have with your health care provider. Document Released: 03/06/2004 Document Revised: 09/25/2017 Document Reviewed: 09/25/2017 Elsevier Interactive Patient Education  2019 [place], Care After These instructions provide you with information about caring for yourself after your procedure. Your health care provider may also give you more specific instructions. Your treatment has been planned according to current medical practices, but problems sometimes occur. Call your health care provider if you have any problems or questions after your procedure. What can I expect after the procedure? After your procedure, you may:  Feel sleepy for several hours.  Feel clumsy and have poor balance for several hours.  Feel forgetful about what happened after the procedure.  Have poor judgment for several hours.  Feel nauseous or vomit.  Have a sore throat if you had a breathing tube during the procedure. Follow these instructions at home: For at least 24 hours after the procedure:      Have a responsible adult stay with you. It is  important to have someone help care for you until you are awake and alert.  Rest as needed.  Do not: ? Participate in activities in which you could fall or become injured. ? Drive. ? Use heavy machinery. ? Drink alcohol. ? Take sleeping pills or medicines that cause drowsiness. ? Make important decisions or sign legal documents. ? Take care of children on your own. Eating and drinking  Follow the diet that is recommended by your health care provider.  If you vomit, drink water, juice, or soup when you can drink without vomiting.  Make sure you have little or no nausea before eating solid foods. General instructions  Take over-the-counter and prescription medicines only as told by your health care provider.  If you have sleep apnea, surgery and certain medicines can increase your risk for breathing problems. Follow instructions from your health care provider about wearing your sleep device: ? Anytime you are sleeping, including during daytime naps. ? While taking prescription pain medicines, sleeping medicines, or medicines that make you drowsy.  If you smoke, do not smoke without supervision.  Keep all follow-up visits as told by your health care provider. This is important. Contact a health care provider if:  You keep feeling nauseous or you keep vomiting.  You feel light-headed.  You develop a rash.  You have a fever. Get help right away if:  You have trouble breathing. Summary  For several hours after your procedure, you may feel sleepy and have poor judgment.  Have a responsible adult stay with you for at least 24 hours or until you are awake and alert. This information is not intended to replace advice given to you by your health care provider. Make sure you discuss any questions you have with your health care provider. Document Released: 10/01/2015 Document Revised: 01/24/2017 Document Reviewed: 10/01/2015 Elsevier Interactive Patient Education  2019 [employer].

## 2018-07-23 NOTE — Anesthesia Postprocedure Evaluation (Signed)
Anesthesia Post Note  Patient: [name]  Procedure(s) Performed: COLONOSCOPY WITH PROPOFOL (N/A ) POLYPECTOMY  Patient location during evaluation: PACU Anesthesia Type: MAC Level of consciousness: awake and alert and patient cooperative Pain management: satisfactory to patient Vital Signs Assessment: post-procedure vital signs reviewed and stable Respiratory status: spontaneous breathing Cardiovascular status: stable Postop Assessment: no apparent nausea or vomiting Anesthetic complications: no     Last Vitals:  Vitals:   07/23/18 0908 07/23/18 0915  BP: (!) 115/48 (!) 119/55  Pulse: 76 72  Resp: 16 13  Temp: 37.1 C   SpO2: 100% 98%    Last Pain:  Vitals:   07/23/18 0908  TempSrc:   PainSc: 0-No pain                 [name]

## 2018-07-23 NOTE — Op Note (Signed)
[facility] Patient Name: [name] Procedure Date: 07/23/2018 8:32 AM MRN: [id number] Date of Birth: [birth year] Attending MD: Norvel Richards , MD CSN: [id number] Age: 73 Admit Type: Outpatient Procedure:                Colonoscopy Indications:              High risk colon cancer surveillance: Personal                            history of colonic polyps Providers:                Norvel Richards, MD, Otis Peak B. Sharon Seller, RN,                            Gerome Sam, RN, Aram Candela Referring MD:             Dixie Dials. Yu MD Medicines:                Propofol per Anesthesia Complications:            No immediate complications. Estimated Blood Loss:     Estimated blood loss was minimal. Procedure:                Pre-Anesthesia Assessment:                           - Prior to the procedure, a History and Physical                            was performed, and patient medications and                            allergies were reviewed. The patient's tolerance of                            previous anesthesia was also reviewed. The risks                            and benefits of the procedure and the sedation                            options and risks were discussed with the patient.                            All questions were answered, and informed consent                            was obtained. Prior Anticoagulants: The patient has                            taken no previous anticoagulant or antiplatelet                            agents. ASA Grade Assessment: II - A patient with  mild systemic disease. After reviewing the risks                            and benefits, the patient was deemed in                            satisfactory condition to undergo the procedure.                           After obtaining informed consent, the colonoscope                            was passed under direct vision. Throughout the         procedure, the patient's blood pressure, pulse, and                            oxygen saturations were monitored continuously. The                            [id number]-[id number] ([id number]) scope was introduced through                            the and advanced to the the cecum, identified by                            appendiceal orifice and ileocecal valve. Scope In: 8:51:04 AM Scope Out: 9:03:55 AM Scope Withdrawal Time: 0 hours 8 minutes 33 seconds  Total Procedure Duration: 0 hours 12 minutes 51 seconds  Findings:      The perianal and digital rectal examinations were normal.      Scattered medium-mouthed diverticula were found in the sigmoid colon and       descending colon.      A 5 mm polyp was found in the hepatic flexure. The polyp was sessile.       The polyp was removed with a cold snare. Resection and retrieval were       complete. Estimated blood loss was minimal.      Non-bleeding internal hemorrhoids were found during retroflexion. The       hemorrhoids were moderate and Grade I (internal hemorrhoids that do not       prolapse).      The exam was otherwise without abnormality on direct and retroflexion       views. Impression:               - Diverticulosis in the sigmoid colon and in the                            descending colon.                           - One 5 mm polyp at the hepatic flexure, removed                            with a cold snare. Resected and retrieved.                           -  Non-bleeding internal hemorrhoids.                           - The examination was otherwise normal on direct                            and retroflexion views. Moderate Sedation:      Moderate (conscious) sedation was personally administered by an       anesthesia professional. The following parameters were monitored: oxygen       saturation, heart rate, blood pressure, respiratory rate, EKG, adequacy       of pulmonary ventilation, and response to care. Recommendation:            - Patient has a contact number available for                            emergencies. The signs and symptoms of potential                            delayed complications were discussed with the                            patient. Return to normal activities tomorrow.                            Written discharge instructions were provided to the                            patient.                           - Advance diet as tolerated.                           - Continue present medications.                           - Repeat colonoscopy date to be determined after                            pending pathology results are reviewed for                            surveillance based on pathology results.                           - Return to GI clinic (date not yet determined). Procedure Code(s):        --- Professional ---                           [phone], Colonoscopy, flexible; with removal of                            tumor(s), polyp(s), or other lesion(s) by snare  technique Diagnosis Code(s):        --- Professional ---                           Z86.010, Personal history of colonic polyps                           K64.0, First degree hemorrhoids                           D12.3, Benign neoplasm of transverse colon (hepatic                            flexure or splenic flexure)                           K57.30, Diverticulosis of large intestine without                            perforation or abscess without bleeding CPT copyright 2018 American [employer] Association. All rights reserved. The codes documented in this report are preliminary and upon coder review may  be revised to meet current compliance requirements. Cristopher Estimable. Xitlaly Ault, MD Norvel Richards, MD 07/23/2018 9:12:55 AM This report has been signed electronically. Number of Addenda: 0

## 2018-07-23 NOTE — Anesthesia Procedure Notes (Signed)
Procedure Name: MAC Date/Time: 07/23/2018 8:42 AM Performed by: Vista Deck, CRNA Pre-anesthesia Checklist: Patient identified, Emergency Drugs available, Suction available, Timeout performed and Patient being monitored Patient Re-evaluated:Patient Re-evaluated prior to induction Oxygen Delivery Method: Nasal Cannula

## 2018-07-26 ENCOUNTER — Encounter: Payer: Self-pay | Admitting: Internal Medicine

## 2018-07-29 ENCOUNTER — Encounter (HOSPITAL_COMMUNITY): Payer: Self-pay | Admitting: Internal Medicine

## 2018-08-19 ENCOUNTER — Ambulatory Visit (INDEPENDENT_AMBULATORY_CARE_PROVIDER_SITE_OTHER): Payer: Medicare Other

## 2018-08-19 ENCOUNTER — Encounter: Payer: Self-pay | Admitting: Orthopedic Surgery

## 2018-08-19 ENCOUNTER — Ambulatory Visit (INDEPENDENT_AMBULATORY_CARE_PROVIDER_SITE_OTHER): Payer: Medicare Other | Admitting: Orthopedic Surgery

## 2018-08-19 VITALS — BP 127/80 | HR 71 | Ht 62.0 in | Wt 199.0 lb

## 2018-08-19 DIAGNOSIS — M25561 Pain in right knee: Secondary | ICD-10-CM | POA: Diagnosis not present

## 2018-08-19 DIAGNOSIS — M25562 Pain in left knee: Secondary | ICD-10-CM

## 2018-08-19 DIAGNOSIS — G8929 Other chronic pain: Secondary | ICD-10-CM | POA: Diagnosis not present

## 2018-08-19 DIAGNOSIS — Z96651 Presence of right artificial knee joint: Secondary | ICD-10-CM

## 2018-08-19 MED ORDER — HYDROCODONE-ACETAMINOPHEN 5-325 MG PO TABS: 1.0000 | ORAL_TABLET | Freq: Four times a day (QID) | ORAL | 0 refills | Status: DC | PRN

## 2018-08-19 NOTE — Progress Notes (Signed)
Progress Note   [name], female   DOB: November 23, 1945, 73 y.o.   MRN: [id number]   Chief Complaint  Patient presents with  . Knee Pain    bilateral left greater than right     73 year old female had a right total knee in 2002, complains of bilateral knee pain left worse than right which does not localize.  Last x-ray left knee was 2017 that showed osteoarthritis.  Patient also complains of chest pain shortness of breath back pain.  She says that she was placed on Glucophage to control her appetite but is not diabetic  Pain is best described as a dull ache without associated catching locking or giving way  Pain rated greater than 7 out of 10    Review of Systems  Constitutional: Positive for malaise/fatigue.  Respiratory: Positive for shortness of breath.   Cardiovascular: Positive for chest pain.  Gastrointestinal: Negative.   Genitourinary: Negative.   Musculoskeletal: Positive for back pain.   Past Medical History:  Diagnosis Date  . Adenomatous colon polyp 2006   excised in 2006 & 2010Due surveillance 06/2013  . Anxiety   . Anxiety and depression   . Asthma   . Breast mass    right nipple bengn mass per patient  . Breast tumor   . Chest discomfort   . Chronic back pain   . Chronic constipation   . Complication of anesthesia   . Depression   . Diverticulosis   . Fibromyalgia   . Gastroesophageal reflux disease   . Hiatal hernia   . History of benign esophageal tumor 2010   granular cell esophageal tumor (Dx 06/2008), resected via EMR 2011, due repeat EGD 02/2012  . Hyperlipidemia   . Hypertension   . Insomnia   . Migraines   . PONV (postoperative nausea and vomiting)   . Psychosis ([place])   . Sleep apnea    Stop Bang score of 5. Pt said she was told by Dr. Merlene Laughter that she had "a little bit" of sleep apena, but not bad enough to treat.  . Thyroid nodule   . Tumor of esophagus      Allergies  Allergen Reactions  . Elavil [Amitriptyline]  Other (See Comments)    Felt really nervous and felt like something was hold her feet or arm and/ or AM headache on it and back on it and went away off it.   . Abilify [Aripiprazole] Other (See Comments)    Dystonic reaction  . Codeine Hives, Nausea Only and Other (See Comments)    Feels funny  . Latex Hives  . Penicillins Hives, Itching and Other (See Comments)    Has patient had a PCN reaction causing immediate rash, facial/tongue/throat swelling, SOB or lightheadedness with hypotension: Yes Has patient had a PCN reaction causing severe rash involving mucus membranes or skin necrosis: No Has patient had a PCN reaction that required hospitalization No Has patient had a PCN reaction occurring within the last 10 years: No If all of the above answers are "NO", then may proceed with Cephalosporin use.   . Polyethylene Glycol Other (See Comments)    Per allergy test  . Sulfonamide Derivatives Nausea And Vomiting  . Cymbalta [Duloxetine Hcl] Rash  . Remeron [Mirtazapine] Other (See Comments)    Caused lots of strange, weird, crazy dreams.     BP 127/80   Pulse 71   Ht 5\' 2"  (1.575 m)   Wt 199 lb (90.3 kg)   BMI  36.40 kg/m   Physical Exam Vitals signs reviewed.  Constitutional:      Appearance: Normal appearance. She is well-developed.  Musculoskeletal:     Right knee: She exhibits no effusion.     Left knee: She exhibits no effusion.  Neurological:     Mental Status: She is alert and oriented to person, place, and time.     Gait: Gait abnormal.  Psychiatric:        Attention and Perception: Attention normal.        Mood and Affect: Mood and affect normal.        Speech: Speech normal.        Behavior: Behavior normal.        Thought Content: Thought content normal.        Judgment: Judgment normal.    Right Knee Exam   Muscle Strength  The patient has normal right knee strength.  Tenderness  Right knee tenderness location: Global diffuse tenderness.  Range of  Motion  Extension: normal  Flexion:  120 normal   Tests  McMurray:  Medial - negative Lateral - negative Varus: negative Valgus: negative Drawer:  Anterior - negative    Posterior - negative  Other  Erythema: absent Scars: present Sensation: normal Pulse: present Swelling: none Effusion: no effusion present  Comments:  Right knee skin incision from knee replacement no tenderness erythema or excessive scarring   Left Knee Exam   Muscle Strength  The patient has normal left knee strength.  Tenderness  Left knee tenderness location: Global diffuse tenderness.  Range of Motion  Extension: normal  Flexion: normal Left knee flexion: 125.   Tests  McMurray:  Medial - negative Lateral - negative Varus: negative Valgus: negative Drawer:  Anterior - negative     Posterior - negative  Other  Erythema: absent Scars: absent Sensation: normal Pulse: present Swelling: none Effusion: no effusion present       Medical decisions:   Data  Imaging:   Bilateral x-rays of the knee  See the dictated reports the right knee has a well fixed well functioning well aligned total knee [employer]  Left knee shows moderate arthritis  Encounter Diagnoses  Name Primary?  . Chronic pain of right knee   . Chronic pain of left knee Yes  . History of total right knee replacement     PLAN: She is not healthy enough for knee replacement surgery but is failed ibuprofen therapy  Injected left knee Start oral medication for pain  Procedure note left knee injection verbal consent was obtained to inject left knee joint  Timeout was completed to confirm the site of injection  The medications used were 40 mg of Depo-Medrol and 1% lidocaine 3 cc  Anesthesia was provided by ethyl chloride and the skin was prepped with alcohol.  After cleaning the skin with alcohol a 20-gauge needle was used to inject the left knee joint. There were no complications. A sterile bandage was  applied.  Meds ordered this encounter  Medications  . HYDROcodone-acetaminophen (NORCO/VICODIN) 5-325 MG tablet    Sig: Take 1 tablet by mouth every 6 (six) hours as needed for moderate pain.    Dispense:  30 tablet    Refill:  0       Arther Abbott, MD 08/19/2018 11:21 AM

## 2018-09-13 ENCOUNTER — Other Ambulatory Visit: Payer: Self-pay | Admitting: Gastroenterology

## 2018-09-25 ENCOUNTER — Other Ambulatory Visit: Payer: Self-pay | Admitting: Orthopedic Surgery

## 2018-09-25 MED ORDER — HYDROCODONE-ACETAMINOPHEN 5-325 MG PO TABS: 1.0000 | ORAL_TABLET | Freq: Four times a day (QID) | ORAL | 0 refills | Status: DC | PRN

## 2018-10-08 DIAGNOSIS — B372 Candidiasis of skin and nail: Secondary | ICD-10-CM | POA: Diagnosis not present

## 2018-10-08 DIAGNOSIS — R194 Change in bowel habit: Secondary | ICD-10-CM | POA: Diagnosis not present

## 2018-10-20 ENCOUNTER — Other Ambulatory Visit: Payer: Self-pay | Admitting: *Deleted

## 2018-11-18 DIAGNOSIS — Z79899 Other long term (current) drug therapy: Secondary | ICD-10-CM | POA: Diagnosis not present

## 2018-11-18 DIAGNOSIS — M130 Polyarthritis, unspecified: Secondary | ICD-10-CM | POA: Diagnosis not present

## 2018-11-18 DIAGNOSIS — M542 Cervicalgia: Secondary | ICD-10-CM | POA: Diagnosis not present

## 2018-11-18 DIAGNOSIS — G43711 Chronic migraine without aura, intractable, with status migrainosus: Secondary | ICD-10-CM | POA: Diagnosis not present

## 2018-12-11 ENCOUNTER — Telehealth: Payer: Self-pay | Admitting: Cardiology

## 2018-12-11 NOTE — Telephone Encounter (Signed)
Virtual Visit Pre-Appointment Phone Call  "(Name), I am calling you today to discuss your upcoming appointment. We are currently trying to limit exposure to the virus that causes COVID-19 by seeing patients at home rather than in the office."  1. "What is the BEST phone number to call the day of the visit?" - include this in appointment notes  2. Do you have or have access to (through a family member/friend) a smartphone with video capability that we can use for your visit?" a. If yes - list this number in appt notes as cell (if different from BEST phone #) and list the appointment type as a VIDEO visit in appointment notes b. If no - list the appointment type as a PHONE visit in appointment notes  3. Confirm consent - "In the setting of the current Covid19 crisis, you are scheduled for a (phone or video) visit with your provider on (date) at (time).  Just as we do with many in-office visits, in order for you to participate in this visit, we must obtain consent.  If you'd like, I can send this to your mychart (if signed up) or email for you to review.  Otherwise, I can obtain your verbal consent now.  All virtual visits are billed to your insurance company just like a normal visit would be.  By agreeing to a virtual visit, we'd like you to understand that the technology does not allow for your provider to perform an examination, and thus may limit your provider's ability to fully assess your condition. If your provider identifies any concerns that need to be evaluated in person, we will make arrangements to do so.  Finally, though the technology is pretty good, we cannot assure that it will always work on either your or our end, and in the setting of a video visit, we may have to convert it to a phone-only visit.  In either situation, we cannot ensure that we have a secure connection.  Are you willing to proceed?" STAFF: Did the patient verbally acknowledge consent to telehealth visit? Document  YES/NO here: Yes  4. Advise patient to be prepared - "Two hours prior to your appointment, go ahead and check your blood pressure, pulse, oxygen saturation, and your weight (if you have the equipment to check those) and write them all down. When your visit starts, your provider will ask you for this information. If you have an Apple Watch or Kardia device, please plan to have heart rate information ready on the day of your appointment. Please have a pen and paper handy nearby the day of the visit as well."  5. Give patient instructions for MyChart download to smartphone OR [email or web address] as below if video visit (depending on what platform provider is using)  6. Inform patient they will receive a phone call 15 minutes prior to their appointment time (may be from unknown caller ID) so they should be prepared to answer    TELEPHONE CALL NOTE  [name] has been deemed a candidate for a follow-up tele-health visit to limit community exposure during the Covid-19 pandemic. I spoke with the patient via phone to ensure availability of phone/video source, confirm preferred email & phone number, and discuss instructions and expectations.  I reminded [name] to be prepared with any vital sign and/or heart rhythm information that could potentially be obtained via home monitoring, at the time of her visit. I reminded [name] to expect a phone call prior to  her visit.  Terry L Goins 12/11/2018 1:13 PM

## 2018-12-15 ENCOUNTER — Other Ambulatory Visit: Payer: Self-pay

## 2018-12-15 ENCOUNTER — Telehealth (INDEPENDENT_AMBULATORY_CARE_PROVIDER_SITE_OTHER): Payer: Medicare Other | Admitting: Cardiology

## 2018-12-15 ENCOUNTER — Encounter: Payer: Self-pay | Admitting: Cardiology

## 2018-12-15 VITALS — BP 139/87 | Ht 62.0 in | Wt 184.2 lb

## 2018-12-15 DIAGNOSIS — I10 Essential (primary) hypertension: Secondary | ICD-10-CM

## 2018-12-15 DIAGNOSIS — I5032 Chronic diastolic (congestive) heart failure: Secondary | ICD-10-CM

## 2018-12-15 MED ORDER — FUROSEMIDE 40 MG PO TABS: ORAL_TABLET | ORAL | 3 refills | Status: DC

## 2018-12-15 MED ORDER — CHLORTHALIDONE 25 MG PO TABS: 12.5000 mg | ORAL_TABLET | Freq: Every day | ORAL | 3 refills | Status: DC

## 2018-12-15 NOTE — Patient Instructions (Signed)
Medication Instructions:  START CHLORTHALIDONE 12.5 MG DAILY  TAKE LASIX 40 MG DAILY- AS NEEDED FOR SWELLING   Labwork:2 WEEKS  BMET MAGNESIUM   Testing/Procedures: NONE  Follow-Up: Your physician recommends that you schedule a follow-up appointment in: 6 WEEKS    Any Other Special Instructions Will Be Listed Below (If Applicable).     If you need a refill on your cardiac medications before your next appointment, please call your pharmacy.

## 2018-12-15 NOTE — Progress Notes (Signed)
Virtual Visit via Telephone Note   This visit type was conducted due to national recommendations for restrictions regarding the COVID-19 Pandemic (e.g. social distancing) in an effort to limit this patient's exposure and mitigate transmission in our community.  Due to her co-morbid illnesses, this patient is at least at moderate risk for complications without adequate follow up.  This format is felt to be most appropriate for this patient at this time.  The patient did not have access to video technology/had technical difficulties with video requiring transitioning to audio format only (telephone).  All issues noted in this document were discussed and addressed.  No physical exam could be performed with this format.  Please refer to the patient's chart for her  consent to telehealth for [facility].   Date:  12/15/2018   ID:  [name], DOB 07/21/1945, MRN [id number]  Patient Location: Home Provider Location: Home  PCP:  Rosita Fire, MD  Cardiologist:  Carlyle Dolly, MD  Electrophysiologist:  None   Evaluation Performed:  Follow-Up Visit  Chief Complaint:  Follow up visit  History of Present Illness:    [name] is a 73 y.o. female seen today for follow up of the following medical problems.  1. Lightheadedness/Dizziness  - started approx 1 year ago. Feeling of lightheadedness, but at times can feel like the room is spinning. Can happen laying down, sitting down, or standing. No association w/ exertion.Sometimes brought on by rolling over in bed. - event monitor shows just mild sinus brady which is her baseline    - no recent symptoms  2. Chronic diastolic heart failure - echo 08/2014 LVEF 60-65%, abnormal diastolic function indeterminate grade.    - no recent edema. No SOB or DOE - compliant with meds. Recent significant weight loss in general. Takes lasix 54m just prn   3. HTN - she remains compliant with meds - checks daily, typically  150s/100    New pcp Dr YRosalee Kaufman [place]   The patient does not have symptoms concerning for COVID-19 infection (fever, chills, cough, or new shortness of breath).    Past Medical History:  Diagnosis Date  . Adenomatous colon polyp 2006   excised in 2006 & 2010Due surveillance 06/2013  . Anxiety   . Anxiety and depression   . Asthma   . Breast mass    right nipple bengn mass per patient  . Breast tumor   . Chest discomfort   . Chronic back pain   . Chronic constipation   . Complication of anesthesia   . Depression   . Diverticulosis   . Fibromyalgia   . Gastroesophageal reflux disease   . Hiatal hernia   . History of benign esophageal tumor 2010   granular cell esophageal tumor (Dx 06/2008), resected via EMR 2011, due repeat EGD 02/2012  . Hyperlipidemia   . Hypertension   . Insomnia   . Migraines   . PONV (postoperative nausea and vomiting)   . Psychosis ([place]   . Sleep apnea    Stop Bang score of 5. Pt said she was told by Dr. DMerlene Laughterthat she had "a little bit" of sleep apena, but not bad enough to treat.  . Thyroid nodule   . Tumor of esophagus    Past Surgical History:  Procedure Laterality Date  . ABDOMINAL HYSTERECTOMY    [place]  [place]   Procedure: [place]VO [place]  Surgeon: RDaneil Dolin MD;  Location: [facility];  Service: Endoscopy;  Laterality: N/A;  . 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 CHOLECYSTECTOMY N/A 04/09/2013   Procedure: LAPAROSCOPIC CHOLECYSTECTOMY;  Surgeon: Jamesetta So, MD;  Location: [facility] ORS;  Service: General;  Laterality: N/A;  . COLONOSCOPY  06/2008, 06/2011   sigmoid tics, tubular adenoma; 2013: anal canal hemorrhoids  . COLONOSCOPY  07/10/2011   Anal canal hemorrhoids likely the cause of hematochezia in the setting of constipation; otherwise normal rectum ;submucosal  petechiae in left colon of doubtful clinical significance; otherwise, normal colon  . COLONOSCOPY  WITH PROPOFOL N/A 11/10/2017   cancelled in pre-op  . COLONOSCOPY WITH PROPOFOL N/A 07/23/2018   Procedure: COLONOSCOPY WITH PROPOFOL;  Surgeon: Daneil Dolin, MD;  Location: [facility];  Service: Endoscopy;  Laterality: N/A;  9:30am  . ESOPHAGEAL DILATION N/A 05/08/2015   Procedure: ESOPHAGEAL DILATION;  Surgeon: Daneil Dolin, MD;  Location: [facility] ORS;  Service: Endoscopy;  Laterality: N/AVenia Minks 54/56  . ESOPHAGOGASTRODUODENOSCOPY  12/11/2011   [id number] Schatzki's ring; otherwise normal/Small hiatal hernia. Antral and bulbar erosions  . ESOPHAGOGASTRODUODENOSCOPY (EGD) WITH PROPOFOL N/A 05/08/2015   Dr. Gala Romney: mild erosive reflux esophagitis, non-critical Schatzki's ring s/p dilation. Hiatal hernia.   . EUS  08/2010   Dr [facility] with EGD. Retained food. No recurrent esophageal lesion, bx negative.  Marland Kitchen LUNG BIOPSY     negative  . PARATHYROIDECTOMY  April 2017   [facility].   Marland Kitchen POLYPECTOMY  07/23/2018   Procedure: POLYPECTOMY;  Surgeon: Daneil Dolin, MD;  Location: [facility];  Service: Endoscopy;;  colon  . RIGHT OOPHORECTOMY     benign disease  . SHOULDER ARTHROSCOPY     Right; bone spurs removed  . TONSILLECTOMY    . TOTAL KNEE ARTHROPLASTY  2002   Right; previous arthroscopic surgery     Current Meds  Medication Sig  . albuterol (PROVENTIL HFA;VENTOLIN HFA) 108 (90 BASE) MCG/ACT inhaler Inhale 2 puffs into the lungs every 6 (six) hours as needed for wheezing or shortness of breath.   Marland Kitchen amLODipine (NORVASC) 10 MG tablet Take 10 mg by mouth daily.   Marland Kitchen atenolol (TENORMIN) 100 MG tablet Take 100 mg by mouth daily.  Marland Kitchen donepezil (ARICEPT) 10 MG tablet Take 10 mg by mouth at bedtime.  . fluticasone (FLONASE) 50 MCG/ACT nasal spray Place 1 spray into both nostrils daily as needed for allergies.   Marland Kitchen gabapentin (NEURONTIN) 300 MG capsule Take 300 mg by mouth at bedtime.   Marland Kitchen HYDROcodone-acetaminophen (NORCO/VICODIN) 5-325 MG tablet Take 1 tablet by mouth every 6 (six) hours as  needed for moderate pain.  Marland Kitchen lovastatin (MEVACOR) 40 MG tablet Take 40 mg by mouth at bedtime.  . Melatonin 3 MG TABS Take 9 mg by mouth at bedtime.   . memantine (NAMENDA) 10 MG tablet Take 10 mg by mouth 2 (two) times daily.   . metFORMIN (GLUCOPHAGE-XR) 500 MG 24 hr tablet Take 1,000 mg by mouth 2 (two) times daily.   . Na Sulfate-K Sulfate-Mg Sulf (SUPREP BOWEL PREP KIT) 17.5-3.13-1.6 GM/177ML SOLN Take 1 kit by mouth as directed.  . naloxegol oxalate (MOVANTIK) 25 MG TABS tablet Take 1 tablet (25 mg total) by mouth daily. Take 1 hour before breakfast.  . OLANZapine (ZYPREXA) 7.5 MG tablet Take 7.5 mg by mouth at bedtime.  . RABEprazole (ACIPHEX) 20 MG tablet Take 1 tablet (20 mg total) by mouth daily before breakfast.  . spironolactone (ALDACTONE) 25 MG tablet  Take 25 mg by mouth daily.  Marland Kitchen topiramate (TOPAMAX) 100 MG tablet Take 100 mg by mouth daily.   . traZODone (DESYREL) 100 MG tablet Take 300 mg by mouth at bedtime.   Marland Kitchen venlafaxine XR (EFFEXOR-XR) 150 MG 24 hr capsule Take 150 mg by mouth daily with breakfast.   . venlafaxine XR (EFFEXOR-XR) 75 MG 24 hr capsule Take 75 mg by mouth daily with breakfast.   . Vitamin D, Ergocalciferol, (DRISDOL) 50000 units CAPS capsule Take 50,000 Units by mouth every Wednesday.      Allergies:   Elavil [amitriptyline], Abilify [aripiprazole], Codeine, Latex, Penicillins, Polyethylene glycol, Sulfonamide derivatives, Cymbalta [duloxetine hcl], and Remeron [mirtazapine]   Social History   Tobacco Use  . Smoking status: Never Smoker  . Smokeless tobacco: Never Used  Substance Use Topics  . Alcohol use: No    Alcohol/week: 0.0 standard drinks  . Drug use: No     Family Hx: The patient's family history includes Alcohol abuse in her father; Anxiety disorder in her mother; Colon cancer in her paternal aunt and paternal uncle; Dementia in her maternal uncle; Depression in her mother; Heart attack in her mother; Stroke in her father. There is no  history of ADD / ADHD, Bipolar disorder, Drug abuse, OCD, Paranoid behavior, Schizophrenia, Seizures, Sexual abuse, or Physical abuse.  ROS:   Please see the history of present illness.     All other systems reviewed and are negative.   Prior CV studies:   The following studies were reviewed today:  02/17/13 Event monitor: baseline sinus brady in 50s, symptoms of dizziness correlate w/ sinus brady in 25s. Occasional PVCs, 1 PVC couplet.   01/2012 Carotid US: minimal plaque at bifurcations, no stenosis   10/2014 echo Study Conclusions  - Left ventricle: The cavity size was normal. Wall thickness was increased in a pattern of mild LVH. Systolic function was normal. The estimated ejection fraction was in the range of 60% to 65%. Abnormal diastoilc function, indeterminate grade. - Aortic valve: There was mild regurgitation. Valve area (VTI): 2.48 cm^2. Valve area (Vmax): 2.89 cm^2. - Technically adequate study.  Labs/Other Tests and Data Reviewed:    EKG:  No ECG reviewed.  Recent Labs: 07/16/2018: BUN 10; Creatinine, Ser 0.83; Hemoglobin 12.7; Platelets 196; Potassium 4.1; Sodium 140   Recent Lipid Panel Lab Results  Component Value Date/Time   CHOL 173 09/27/2014 11:29 AM   TRIG 88 09/27/2014 11:29 AM   HDL 61 09/27/2014 11:29 AM   CHOLHDL 2.8 09/27/2014 11:29 AM   LDLCALC 94 09/27/2014 11:29 AM    Wt Readings from Last 3 Encounters:  12/15/18 184 lb 3.2 oz (83.6 kg)  08/19/18 199 lb (90.3 kg)  07/16/18 209 lb (94.8 kg)     Objective:    Vital Signs:  BP 139/87   Ht '5\' 2"'  (1.575 m)   Wt 184 lb 3.2 oz (83.6 kg)   BMI 33.69 kg/m    Today's Vitals   12/15/18 1009  BP: 139/87  Weight: 184 lb 3.2 oz (83.6 kg)  Height: '5\' 2"'  (1.575 m)   Body mass index is 33.69 kg/m.  Normal affect. Normal speech pattern and tone. Comfortable, no apparent distress. No audible signs of SOB or wheezing  ASSESSMENT & PLAN:    1. Chronic diastolic HF - no recent  symptoms, continue to monitor  2. HTN -above goal, we will start chlorthalidone 12.28m daily. Check BMET/Mg in 2 weeks, she will continue her lasix just prn 40 mg  COVID-19 Education: The signs and symptoms of COVID-19 were discussed with the patient and how to seek care for testing (follow up with PCP or arrange E-visit).  The importance of social distancing was discussed today.  Time:   Today, I have spent 14 minutes with the patient with telehealth technology discussing the above problems.     Medication Adjustments/Labs and Tests Ordered: Current medicines are reviewed at length with the patient today.  Concerns regarding medicines are outlined above.   Tests Ordered: No orders of the defined types were placed in this encounter.   Medication Changes: No orders of the defined types were placed in this encounter.   Follow Up:  In Person in 6 month(s)  Signed, Carlyle Dolly, MD  12/15/2018 10:12 AM    [place]

## 2018-12-15 NOTE — Addendum Note (Signed)
Addended byDebbora Lacrosse R on: 12/15/2018 10:35 AM   Modules accepted: Orders

## 2018-12-16 ENCOUNTER — Other Ambulatory Visit: Payer: Self-pay | Admitting: Orthopedic Surgery

## 2018-12-16 DIAGNOSIS — G43711 Chronic migraine without aura, intractable, with status migrainosus: Secondary | ICD-10-CM | POA: Diagnosis not present

## 2018-12-17 ENCOUNTER — Other Ambulatory Visit (HOSPITAL_COMMUNITY): Payer: Self-pay | Admitting: Family Medicine

## 2018-12-17 DIAGNOSIS — Z1231 Encounter for screening mammogram for malignant neoplasm of breast: Secondary | ICD-10-CM

## 2018-12-28 ENCOUNTER — Telehealth: Payer: Self-pay | Admitting: Cardiology

## 2018-12-28 NOTE — Telephone Encounter (Signed)
Dr Harl Bowie verified that patient is taking lasix prn and chlorthalidone with aldactone.

## 2018-12-28 NOTE — Telephone Encounter (Signed)
Please give Josh- Pharmacist w/ [facility] [place] he's needing clarification on her medications   [phone] opt 3

## 2018-12-29 ENCOUNTER — Other Ambulatory Visit: Payer: Self-pay | Admitting: Orthopedic Surgery

## 2018-12-29 NOTE — Telephone Encounter (Signed)
[name] called and left a message requesting refill on Hydrocodone/Acetaminophen 5-325 mgs.  This was last put in on 12/16/18 through surescripts but was not filled at that time.  The patient has not been seen since 08/19/18.    Not sure if Dr. Aline Brochure wants to go ahead and fill prescription or have patient visit (possibly virtual appointment).

## 2018-12-30 MED ORDER — HYDROCODONE-ACETAMINOPHEN 5-325 MG PO TABS: 1.0000 | ORAL_TABLET | Freq: Four times a day (QID) | ORAL | 0 refills | Status: DC | PRN

## 2019-01-19 ENCOUNTER — Other Ambulatory Visit: Payer: Self-pay

## 2019-01-19 DIAGNOSIS — Z20822 Contact with and (suspected) exposure to covid-19: Secondary | ICD-10-CM

## 2019-01-19 DIAGNOSIS — R6889 Other general symptoms and signs: Secondary | ICD-10-CM | POA: Diagnosis not present

## 2019-01-21 LAB — NOVEL CORONAVIRUS, NAA: SARS-CoV-2, NAA: NOT DETECTED

## 2019-01-26 ENCOUNTER — Ambulatory Visit (INDEPENDENT_AMBULATORY_CARE_PROVIDER_SITE_OTHER): Payer: Medicare Other | Admitting: Cardiology

## 2019-01-26 ENCOUNTER — Encounter: Payer: Self-pay | Admitting: Cardiology

## 2019-01-26 ENCOUNTER — Telehealth: Payer: Self-pay

## 2019-01-26 ENCOUNTER — Other Ambulatory Visit (HOSPITAL_COMMUNITY)
Admission: RE | Admit: 2019-01-26 | Discharge: 2019-01-26 | Disposition: A | Discharge status: Home / Self Care | Payer: Medicare Other | Source: Ambulatory Visit | Attending: Cardiology | Admitting: Cardiology

## 2019-01-26 ENCOUNTER — Other Ambulatory Visit: Payer: Self-pay

## 2019-01-26 VITALS — BP 154/82 | Temp 97.5°F | Ht 62.0 in | Wt 185.0 lb

## 2019-01-26 DIAGNOSIS — I10 Essential (primary) hypertension: Secondary | ICD-10-CM | POA: Insufficient documentation

## 2019-01-26 DIAGNOSIS — I499 Cardiac arrhythmia, unspecified: Secondary | ICD-10-CM

## 2019-01-26 LAB — BASIC METABOLIC PANEL
Anion gap: 15 (ref 5–15)
BUN: 14 mg/dL (ref 8–23)
CO2: 19 mmol/L — ABNORMAL LOW (ref 22–32)
Calcium: 9.9 mg/dL (ref 8.9–10.3)
Chloride: 103 mmol/L (ref 98–111)
Creatinine, Ser: 0.92 mg/dL (ref 0.44–1.00)
GFR calc Af Amer: >60 mL/min (ref >60)
GFR calc non Af Amer: >60 mL/min (ref >60)
Glucose, Bld: 89 mg/dL (ref 70–99)
Potassium: 3.1 mmol/L — ABNORMAL LOW (ref 3.5–5.1)
Sodium: 137 mmol/L (ref 135–145)

## 2019-01-26 LAB — MAGNESIUM: Magnesium: 1.7 mg/dL (ref 1.7–2.4)

## 2019-01-26 NOTE — Progress Notes (Signed)
Clinical Summary Ms. Fountaine is a 73 y.o.female seen today for a focused visit for recent issues with hypertension.    1. HTN -last visit we started chlorthalidone. Took some time to get from her pharmacy, but has been on 1 week.  - home bp's remain 150s/80s. She has not gone for her repeat labs    Past Medical History:  Diagnosis Date  . Adenomatous colon polyp 2006   excised in 2006 & 2010Due surveillance 06/2013  . Anxiety   . Anxiety and depression   . Asthma   . Breast mass    right nipple bengn mass per patient  . Breast tumor   . Chest discomfort   . Chronic back pain   . Chronic constipation   . Complication of anesthesia   . Depression   . Diverticulosis   . Fibromyalgia   . Gastroesophageal reflux disease   . Hiatal hernia   . History of benign esophageal tumor 2010   granular cell esophageal tumor (Dx 06/2008), resected via EMR 2011, due repeat EGD 02/2012  . Hyperlipidemia   . Hypertension   . Insomnia   . Migraines   . PONV (postoperative nausea and vomiting)   . Psychosis ([place])   . Sleep apnea    Stop Bang score of 5. Pt said she was told by Dr. Merlene Laughter that she had "a little bit" of sleep apena, but not bad enough to treat.  . Thyroid nodule   . Tumor of esophagus      Allergies  Allergen Reactions  . Elavil [Amitriptyline] Other (See Comments)    Felt really nervous and felt like something was hold her feet or arm and/ or AM headache on it and back on it and went away off it.   . Abilify [Aripiprazole] Other (See Comments)    Dystonic reaction  . Codeine Hives, Nausea Only and Other (See Comments)    Feels funny  . Latex Hives  . Penicillins Hives, Itching and Other (See Comments)    Has patient had a PCN reaction causing immediate rash, facial/tongue/throat swelling, SOB or lightheadedness with hypotension: Yes Has patient had a PCN reaction causing severe rash involving mucus membranes or skin necrosis: No Has patient had a PCN  reaction that required hospitalization No Has patient had a PCN reaction occurring within the last 10 years: No If all of the above answers are "NO", then may proceed with Cephalosporin use.   . Polyethylene Glycol Other (See Comments)    Per allergy test  . Sulfonamide Derivatives Nausea And Vomiting  . Cymbalta [Duloxetine Hcl] Rash  . Remeron [Mirtazapine] Other (See Comments)    Caused lots of strange, weird, crazy dreams.     Current Outpatient Medications  Medication Sig Dispense Refill  . albuterol (PROVENTIL HFA;VENTOLIN HFA) 108 (90 BASE) MCG/ACT inhaler Inhale 2 puffs into the lungs every 6 (six) hours as needed for wheezing or shortness of breath.     Marland Kitchen amLODipine (NORVASC) 10 MG tablet Take 10 mg by mouth daily.     Marland Kitchen atenolol (TENORMIN) 100 MG tablet Take 100 mg by mouth daily.    . chlorthalidone (HYGROTON) 25 MG tablet Take 0.5 tablets (12.5 mg total) by mouth daily. 45 tablet 3  . donepezil (ARICEPT) 10 MG tablet Take 10 mg by mouth at bedtime.    . fluticasone (FLONASE) 50 MCG/ACT nasal spray Place 1 spray into both nostrils daily as needed for allergies.     . furosemide (  LASIX) 40 MG tablet Take 40 mg daily as needed for swelling. 90 tablet 3  . gabapentin (NEURONTIN) 300 MG capsule Take 300 mg by mouth at bedtime.     Marland Kitchen HYDROcodone-acetaminophen (NORCO/VICODIN) 5-325 MG tablet Take 1 tablet by mouth every 6 (six) hours as needed for moderate pain. 30 tablet 0  . lovastatin (MEVACOR) 40 MG tablet Take 40 mg by mouth at bedtime.    . Melatonin 3 MG TABS Take 9 mg by mouth at bedtime.     . memantine (NAMENDA) 10 MG tablet Take 10 mg by mouth 2 (two) times daily.     . metFORMIN (GLUCOPHAGE-XR) 500 MG 24 hr tablet Take 1,000 mg by mouth 2 (two) times daily.     . Na Sulfate-K Sulfate-Mg Sulf (SUPREP BOWEL PREP KIT) 17.5-3.13-1.6 GM/177ML SOLN Take 1 kit by mouth as directed. 1 Bottle 0  . naloxegol oxalate (MOVANTIK) 25 MG TABS tablet Take 1 tablet (25 mg total) by mouth  daily. Take 1 hour before breakfast. 30 tablet 3  . OLANZapine (ZYPREXA) 7.5 MG tablet Take 7.5 mg by mouth at bedtime.    . RABEprazole (ACIPHEX) 20 MG tablet Take 1 tablet (20 mg total) by mouth daily before breakfast. 30 tablet 4  . spironolactone (ALDACTONE) 25 MG tablet Take 25 mg by mouth daily.    Marland Kitchen topiramate (TOPAMAX) 100 MG tablet Take 100 mg by mouth daily.     . traZODone (DESYREL) 100 MG tablet Take 300 mg by mouth at bedtime.   3  . venlafaxine XR (EFFEXOR-XR) 150 MG 24 hr capsule Take 150 mg by mouth daily with breakfast.     . venlafaxine XR (EFFEXOR-XR) 75 MG 24 hr capsule Take 75 mg by mouth daily with breakfast.     . Vitamin D, Ergocalciferol, (DRISDOL) 50000 units CAPS capsule Take 50,000 Units by mouth every Wednesday.      No current facility-administered medications for this visit.      Past Surgical History:  Procedure Laterality Date  . ABDOMINAL HYSTERECTOMY    . BRAVO [place] STUDY  12/11/2011   Procedure: BRAVO [place];  Surgeon: Daneil Dolin, MD;  Location: [facility];  Service: Endoscopy;  Laterality: N/A;  . 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 CHOLECYSTECTOMY N/A 04/09/2013   Procedure: LAPAROSCOPIC CHOLECYSTECTOMY;  Surgeon: Jamesetta So, MD;  Location: [facility];  Service: General;  Laterality: N/A;  . COLONOSCOPY  06/2008, 06/2011   sigmoid tics, tubular adenoma; 2013: anal canal hemorrhoids  . COLONOSCOPY  07/10/2011   Anal canal hemorrhoids likely the cause of hematochezia in the setting of constipation; otherwise normal rectum ;submucosal  petechiae in left colon of doubtful clinical significance; otherwise, normal colon  . COLONOSCOPY WITH PROPOFOL N/A 11/10/2017   cancelled in pre-op  . COLONOSCOPY WITH PROPOFOL N/A 07/23/2018   Procedure: COLONOSCOPY WITH PROPOFOL;  Surgeon: Daneil Dolin, MD;  Location: [facility];  Service: Endoscopy;  Laterality: N/A;  9:30am  . ESOPHAGEAL DILATION N/A 05/08/2015    Procedure: ESOPHAGEAL DILATION;  Surgeon: Daneil Dolin, MD;  Location: [facility];  Service: Endoscopy;  Laterality: N/AVenia Minks 54/56  . ESOPHAGOGASTRODUODENOSCOPY  12/11/2011   VZD:GLOVFIEPPIR Schatzki's ring; otherwise normal/Small hiatal hernia. Antral and bulbar erosions  . ESOPHAGOGASTRODUODENOSCOPY (EGD) WITH PROPOFOL N/A 05/08/2015   Dr. Gala Romney: mild erosive reflux esophagitis, non-critical Schatzki's ring s/p dilation. Hiatal hernia.   . EUS  08/2010   Dr Dixie Dials  with EGD. Retained food. No recurrent esophageal lesion, bx negative.  Marland Kitchen LUNG BIOPSY     negative  . PARATHYROIDECTOMY  April 2017   [facility].   Marland Kitchen POLYPECTOMY  07/23/2018   Procedure: POLYPECTOMY;  Surgeon: Daneil Dolin, MD;  Location: [facility];  Service: Endoscopy;;  colon  . RIGHT OOPHORECTOMY     benign disease  . SHOULDER ARTHROSCOPY     Right; bone spurs removed  . TONSILLECTOMY    . TOTAL KNEE ARTHROPLASTY  2002   Right; previous arthroscopic surgery     Allergies  Allergen Reactions  . Elavil [Amitriptyline] Other (See Comments)    Felt really nervous and felt like something was hold her feet or arm and/ or AM headache on it and back on it and went away off it.   . Abilify [Aripiprazole] Other (See Comments)    Dystonic reaction  . Codeine Hives, Nausea Only and Other (See Comments)    Feels funny  . Latex Hives  . Penicillins Hives, Itching and Other (See Comments)    Has patient had a PCN reaction causing immediate rash, facial/tongue/throat swelling, SOB or lightheadedness with hypotension: Yes Has patient had a PCN reaction causing severe rash involving mucus membranes or skin necrosis: No Has patient had a PCN reaction that required hospitalization No Has patient had a PCN reaction occurring within the last 10 years: No If all of the above answers are "NO", then may proceed with Cephalosporin use.   . Polyethylene Glycol Other (See Comments)    Per allergy test  . Sulfonamide Derivatives  Nausea And Vomiting  . Cymbalta [Duloxetine Hcl] Rash  . Remeron [Mirtazapine] Other (See Comments)    Caused lots of strange, weird, crazy dreams.      Family History  Problem Relation Age of Onset  . Stroke Father   . Alcohol abuse Father   . Heart attack Mother   . Depression Mother   . Anxiety disorder Mother   . Colon cancer Paternal Aunt   . Colon cancer Paternal Uncle   . Dementia Maternal Uncle   . ADD / ADHD Neg Hx   . Bipolar disorder Neg Hx   . Drug abuse Neg Hx   . OCD Neg Hx   . Paranoid behavior Neg Hx   . Schizophrenia Neg Hx   . Seizures Neg Hx   . Sexual abuse Neg Hx   . Physical abuse Neg Hx      Social History Ms. Loomer reports that she has never smoked. She has never used smokeless tobacco. Ms. Tashiro reports no history of alcohol use.   Review of Systems CONSTITUTIONAL: No weight loss, fever, chills, weakness or fatigue.  HEENT: Eyes: No visual loss, blurred vision, double vision or yellow sclerae.No hearing loss, sneezing, congestion, runny nose or sore throat.  SKIN: No rash or itching.  CARDIOVASCULAR: per hpi RESPIRATORY: No shortness of breath, cough or sputum.  GASTROINTESTINAL: No anorexia, nausea, vomiting or diarrhea. No abdominal pain or blood.  GENITOURINARY: No burning on urination, no polyuria NEUROLOGICAL: No headache, dizziness, syncope, paralysis, ataxia, numbness or tingling in the extremities. No change in bowel or bladder control.  MUSCULOSKELETAL: No muscle, back pain, joint pain or stiffness.  LYMPHATICS: No enlarged nodes. No history of splenectomy.  PSYCHIATRIC: No history of depression or anxiety.  ENDOCRINOLOGIC: No reports of sweating, cold or heat intolerance. No polyuria or polydipsia.  Marland Kitchen   Physical Examination Today's Vitals   01/26/19 1324  BP: Marland Kitchen)  154/82  Temp: (!) 97.5 F (36.4 C)  Weight: 185 lb (83.9 kg)  Height: _0  (1.575 m)   Body mass index is 33.84 kg/m.  Gen: resting comfortably, no acute  distress HEENT: no scleral icterus, pupils equal round and reactive, no palptable cervical adenopathy,  CV: irreg. No m/r/g, no jvd Resp: Clear to auscultation bilaterally GI: abdomen is soft, non-tender, non-distended, normal bowel sounds, no hepatosplenomegaly MSK: extremities are warm, no edema.  Skin: warm, no rash Neuro:  no focal deficits Psych: appropriate affect   Diagnostic Studies  02/17/13 Event monitor: baseline sinus brady in 50s, symptoms of dizziness correlate w/ sinus brady in 50s. Occasional PVCs, 1 PVC couplet.   01/2012 Carotid US: minimal plaque at bifurcations, no stenosis   10/2014 echo Study Conclusions  - Left ventricle: The cavity size was normal. Wall thickness was increased in a pattern of mild LVH. Systolic function was normal. The estimated ejection fraction was in the range of 60% to 65%. Abnormal diastoilc function, indeterminate grade. - Aortic valve: There was mild regurgitation. Valve area (VTI): 2.48 cm^2. Valve area (Vmax): 2.89 cm^2. - Technically adequate study.   Assessment and Plan   1. HTN - remains above goal - she will go for labs today, if stable would increase chlorthalidone to 89m daily and have her update [place] bp[place] in 2 weeks.   2. Irregular heart beat - noted on exam, EKG today shows SR with PACs, no significant arrhythmia - no significant symptoms - continue to monitor.    JArnoldo Lenis M.D.

## 2019-01-26 NOTE — Telephone Encounter (Signed)
Called pt. No answer, left message for pt to return call.  

## 2019-01-26 NOTE — Telephone Encounter (Signed)
-----   Message from Arnoldo Lenis, MD sent at 01/26/2019  3:26 PM EDT ----- Potassium is low, take 26mEq x 3 days, then take 44mEq daily. Increase her chlorthalidone to 25mg  daily. She is to call us in 2 weeks to update [place] on her bp's, repeat a BMET/Mg at that time   J BrancH MD

## 2019-01-26 NOTE — Patient Instructions (Signed)
Medication Instructions:  Your physician recommends that you continue on your current medications as directed. Please refer to the Current Medication list given to you today.   Labwork: bmet Magnesium   Testing/Procedures: NONE  Follow-Up: Your physician recommends that you schedule a follow-up appointment in: 4 MONTHS    Any Other Special Instructions Will Be Listed Below (If Applicable).     If you need a refill on your cardiac medications before your next appointment, please call your pharmacy.

## 2019-01-28 ENCOUNTER — Telehealth: Payer: Self-pay

## 2019-01-28 DIAGNOSIS — R0602 Shortness of breath: Secondary | ICD-10-CM

## 2019-01-28 MED ORDER — POTASSIUM CHLORIDE CRYS ER 20 MEQ PO TBCR: EXTENDED_RELEASE_TABLET | ORAL | 6 refills | Status: DC

## 2019-01-28 MED ORDER — CHLORTHALIDONE 25 MG PO TABS: 25.0000 mg | ORAL_TABLET | Freq: Every day | ORAL | 3 refills | Status: DC

## 2019-01-28 NOTE — Telephone Encounter (Signed)
Called pt. No answer, left message for pt to return call.  

## 2019-01-28 NOTE — Addendum Note (Signed)
Addended by: Debbora Lacrosse R on: 01/28/2019 12:47 PM   Modules accepted: Orders

## 2019-01-28 NOTE — Telephone Encounter (Signed)
Pt is returning call.  

## 2019-01-28 NOTE — Telephone Encounter (Signed)
Pt notified. She voiced understanding.  

## 2019-01-28 NOTE — Telephone Encounter (Signed)
-----   Message from Arnoldo Lenis, MD sent at 01/26/2019  3:26 PM EDT ----- Potassium is low, take 77mEq x 3 days, then take 58mEq daily. Increase her chlorthalidone to 25mg  daily. She is to call us in 2 weeks to update [place] on her bp's, repeat a BMET/Mg at that time   J BrancH MD

## 2019-02-01 ENCOUNTER — Encounter (HOSPITAL_COMMUNITY): Payer: Self-pay

## 2019-02-01 ENCOUNTER — Other Ambulatory Visit: Payer: Self-pay

## 2019-02-01 ENCOUNTER — Ambulatory Visit (HOSPITAL_COMMUNITY)
Admission: RE | Admit: 2019-02-01 | Discharge: 2019-02-01 | Disposition: A | Discharge status: Home / Self Care | Payer: Medicare Other | Source: Ambulatory Visit | Attending: Family Medicine | Admitting: Family Medicine

## 2019-02-01 DIAGNOSIS — Z1231 Encounter for screening mammogram for malignant neoplasm of breast: Secondary | ICD-10-CM | POA: Insufficient documentation

## 2019-02-10 ENCOUNTER — Other Ambulatory Visit: Payer: Self-pay | Admitting: Gastroenterology

## 2019-02-18 DIAGNOSIS — G894 Chronic pain syndrome: Secondary | ICD-10-CM | POA: Diagnosis not present

## 2019-02-23 ENCOUNTER — Telehealth: Payer: Self-pay | Admitting: Cardiology

## 2019-02-23 NOTE — Telephone Encounter (Signed)
Pt will drop bp log off here tomorrow.

## 2019-02-23 NOTE — Telephone Encounter (Signed)
Returned call concerning BP and lab work  Can be reached @ [phone]

## 2019-02-24 ENCOUNTER — Other Ambulatory Visit (HOSPITAL_COMMUNITY)
Admission: RE | Admit: 2019-02-24 | Discharge: 2019-02-24 | Disposition: A | Discharge status: Home / Self Care | Payer: Medicare Other | Source: Ambulatory Visit | Attending: Cardiology | Admitting: Cardiology

## 2019-02-24 DIAGNOSIS — R0602 Shortness of breath: Secondary | ICD-10-CM | POA: Diagnosis not present

## 2019-02-24 LAB — BASIC METABOLIC PANEL
Anion gap: 15 (ref 5–15)
BUN: 12 mg/dL (ref 8–23)
CO2: 20 mmol/L — ABNORMAL LOW (ref 22–32)
Calcium: 9.5 mg/dL (ref 8.9–10.3)
Chloride: 93 mmol/L — ABNORMAL LOW (ref 98–111)
Creatinine, Ser: 0.83 mg/dL (ref 0.44–1.00)
GFR calc Af Amer: >60 mL/min (ref >60)
GFR calc non Af Amer: >60 mL/min (ref >60)
Glucose, Bld: 100 mg/dL — ABNORMAL HIGH (ref 70–99)
Potassium: 3.3 mmol/L — ABNORMAL LOW (ref 3.5–5.1)
Sodium: 128 mmol/L — ABNORMAL LOW (ref 135–145)

## 2019-02-24 LAB — MAGNESIUM: Magnesium: 1.9 mg/dL (ref 1.7–2.4)

## 2019-03-03 NOTE — Telephone Encounter (Signed)
Called pt. No answer, left message for pt to return call.  

## 2019-03-03 NOTE — Telephone Encounter (Signed)
-----   Message from Arnoldo Lenis, MD sent at 03/02/2019 11:14 AM EDT ----- Labs show that the increased chlorthalidone is a little bit too strong for her. Hold chlorthalidone x 2 days, then restart at lower dose of 12.5mg  daily. Verify she has been taking KCl 71mEq daily, if so increase to 68mEq daily as her potassium is also low. Needs repeat BMET/Mg in 2 weeks.

## 2019-03-05 ENCOUNTER — Telehealth: Payer: Self-pay

## 2019-03-05 DIAGNOSIS — R0602 Shortness of breath: Secondary | ICD-10-CM

## 2019-03-05 MED ORDER — CHLORTHALIDONE 25 MG PO TABS: 12.5000 mg | ORAL_TABLET | Freq: Every day | ORAL | 3 refills | Status: DC

## 2019-03-05 MED ORDER — POTASSIUM CHLORIDE CRYS ER 20 MEQ PO TBCR: 40.0000 meq | EXTENDED_RELEASE_TABLET | Freq: Every day | ORAL | 6 refills | Status: DC

## 2019-03-05 NOTE — Telephone Encounter (Signed)
Pt made aware of medication changes, to repeat labs in 2 weeks. Voiced understanding.

## 2019-03-05 NOTE — Telephone Encounter (Signed)
-----   Message from Arnoldo Lenis, MD sent at 03/02/2019 11:14 AM EDT ----- Labs show that the increased chlorthalidone is a little bit too strong for her. Hold chlorthalidone x 2 days, then restart at lower dose of 12.5mg  daily. Verify she has been taking KCl 61mEq daily, if so increase to 64mEq daily as her potassium is also low. Needs repeat BMET/Mg in 2 weeks.

## 2019-03-18 DIAGNOSIS — G43711 Chronic migraine without aura, intractable, with status migrainosus: Secondary | ICD-10-CM | POA: Diagnosis not present

## 2019-03-19 ENCOUNTER — Other Ambulatory Visit: Payer: Self-pay

## 2019-03-19 MED ORDER — POTASSIUM CHLORIDE CRYS ER 20 MEQ PO TBCR: 40.0000 meq | EXTENDED_RELEASE_TABLET | Freq: Every day | ORAL | 3 refills | Status: DC

## 2019-03-19 NOTE — Telephone Encounter (Signed)
[facility] states K-Dur is d/c'd by manufacturer, replaced with KLOR cON

## 2019-03-25 ENCOUNTER — Other Ambulatory Visit (HOSPITAL_COMMUNITY)
Admission: RE | Admit: 2019-03-25 | Discharge: 2019-03-25 | Disposition: A | Discharge status: Home / Self Care | Payer: Medicare Other | Source: Ambulatory Visit | Attending: Cardiology | Admitting: Cardiology

## 2019-03-25 DIAGNOSIS — R0602 Shortness of breath: Secondary | ICD-10-CM | POA: Diagnosis not present

## 2019-03-25 LAB — BASIC METABOLIC PANEL
Anion gap: 14 (ref 5–15)
BUN: 10 mg/dL (ref 8–23)
CO2: 20 mmol/L — ABNORMAL LOW (ref 22–32)
Calcium: 9.5 mg/dL (ref 8.9–10.3)
Chloride: 99 mmol/L (ref 98–111)
Creatinine, Ser: 0.86 mg/dL (ref 0.44–1.00)
GFR calc Af Amer: >60 mL/min (ref >60)
GFR calc non Af Amer: >60 mL/min (ref >60)
Glucose, Bld: 94 mg/dL (ref 70–99)
Potassium: 3.3 mmol/L — ABNORMAL LOW (ref 3.5–5.1)
Sodium: 133 mmol/L — ABNORMAL LOW (ref 135–145)

## 2019-03-25 LAB — MAGNESIUM: Magnesium: 1.8 mg/dL (ref 1.7–2.4)

## 2019-03-29 ENCOUNTER — Telehealth: Payer: Self-pay

## 2019-03-29 MED ORDER — POTASSIUM CHLORIDE CRYS ER 20 MEQ PO TBCR: EXTENDED_RELEASE_TABLET | ORAL | 3 refills | Status: DC

## 2019-03-29 NOTE — Telephone Encounter (Signed)
Spoke with pt. She stated that she is  Currently taking KCl 40 meq daily. She will increase to 40 meq in the AM & 20 meq in the PM.

## 2019-03-29 NOTE — Telephone Encounter (Signed)
-----   Message from Arnoldo Lenis, MD sent at 03/29/2019 10:40 AM EDT ----- Potassium remains low, verify taking KCl 40 daily, if so increase to 8mEq in the AM and 12mEq in pm   Zandra Abts MD

## 2019-04-15 DIAGNOSIS — G43711 Chronic migraine without aura, intractable, with status migrainosus: Secondary | ICD-10-CM | POA: Diagnosis not present

## 2019-05-12 ENCOUNTER — Other Ambulatory Visit (HOSPITAL_COMMUNITY): Payer: Self-pay | Admitting: Pulmonary Disease

## 2019-05-12 ENCOUNTER — Ambulatory Visit (HOSPITAL_COMMUNITY)
Admission: RE | Admit: 2019-05-12 | Discharge: 2019-05-12 | Disposition: A | Discharge status: Home / Self Care | Payer: Medicare Other | Source: Ambulatory Visit | Attending: Pulmonary Disease | Admitting: Pulmonary Disease

## 2019-05-12 ENCOUNTER — Other Ambulatory Visit: Payer: Self-pay

## 2019-05-12 DIAGNOSIS — R059 Cough, unspecified: Secondary | ICD-10-CM

## 2019-05-12 DIAGNOSIS — R05 Cough: Secondary | ICD-10-CM | POA: Insufficient documentation

## 2019-05-31 ENCOUNTER — Telehealth: Payer: Self-pay | Admitting: Cardiology

## 2019-05-31 NOTE — Telephone Encounter (Signed)

## 2019-06-01 ENCOUNTER — Ambulatory Visit (INDEPENDENT_AMBULATORY_CARE_PROVIDER_SITE_OTHER): Payer: Medicare Other | Admitting: Cardiology

## 2019-06-01 ENCOUNTER — Other Ambulatory Visit (HOSPITAL_COMMUNITY)
Admission: RE | Admit: 2019-06-01 | Discharge: 2019-06-01 | Disposition: A | Discharge status: Home / Self Care | Payer: Medicare Other | Source: Ambulatory Visit | Attending: Cardiology | Admitting: Cardiology

## 2019-06-01 ENCOUNTER — Encounter: Payer: Self-pay | Admitting: Cardiology

## 2019-06-01 ENCOUNTER — Other Ambulatory Visit: Payer: Self-pay

## 2019-06-01 VITALS — BP 140/86 | HR 110 | Temp 97.1°F | Ht 62.0 in | Wt 175.0 lb

## 2019-06-01 DIAGNOSIS — R0602 Shortness of breath: Secondary | ICD-10-CM

## 2019-06-01 DIAGNOSIS — I10 Essential (primary) hypertension: Secondary | ICD-10-CM | POA: Insufficient documentation

## 2019-06-01 DIAGNOSIS — I499 Cardiac arrhythmia, unspecified: Secondary | ICD-10-CM | POA: Insufficient documentation

## 2019-06-01 DIAGNOSIS — R5383 Other fatigue: Secondary | ICD-10-CM

## 2019-06-01 LAB — HEMOGLOBIN A1C
Hgb A1c MFr Bld: 6.3 % — ABNORMAL HIGH (ref 4.8–5.6)
Mean Plasma Glucose: 134.11 mg/dL

## 2019-06-01 LAB — COMPREHENSIVE METABOLIC PANEL
ALT: 15 U/L (ref 0–44)
AST: 14 U/L — ABNORMAL LOW (ref 15–41)
Albumin: 4.6 g/dL (ref 3.5–5.0)
Alkaline Phosphatase: 47 U/L (ref 38–126)
Anion gap: 16 — ABNORMAL HIGH (ref 5–15)
BUN: 14 mg/dL (ref 8–23)
CO2: 21 mmol/L — ABNORMAL LOW (ref 22–32)
Calcium: 10 mg/dL (ref 8.9–10.3)
Chloride: 99 mmol/L (ref 98–111)
Creatinine, Ser: 0.85 mg/dL (ref 0.44–1.00)
GFR calc Af Amer: >60 mL/min (ref >60)
GFR calc non Af Amer: >60 mL/min (ref >60)
Glucose, Bld: 91 mg/dL (ref 70–99)
Potassium: 3.6 mmol/L (ref 3.5–5.1)
Sodium: 136 mmol/L (ref 135–145)
Total Bilirubin: 0.5 mg/dL (ref 0.3–1.2)
Total Protein: 7.6 g/dL (ref 6.5–8.1)

## 2019-06-01 LAB — LIPID PANEL
Cholesterol: 147 mg/dL (ref 0–200)
HDL: 65 mg/dL (ref >40)
LDL Cholesterol: 66 mg/dL (ref 0–99)
Total CHOL/HDL Ratio: 2.3 RATIO
Triglycerides: 79 mg/dL (ref <150)
VLDL: 16 mg/dL (ref 0–40)

## 2019-06-01 LAB — TSH: TSH: 2.559 u[IU]/mL (ref 0.350–4.500)

## 2019-06-01 LAB — CBC WITH DIFFERENTIAL/PLATELET
Abs Immature Granulocytes: 0.02 10*3/uL (ref 0.00–0.07)
Basophils Absolute: 0 10*3/uL (ref 0.0–0.1)
Basophils Relative: 0 %
Eosinophils Absolute: 0.2 10*3/uL (ref 0.0–0.5)
Eosinophils Relative: 2 %
HCT: 43.9 % (ref 36.0–46.0)
Hemoglobin: 13.4 g/dL (ref 12.0–15.0)
Immature Granulocytes: 0 %
Lymphocytes Relative: 20 %
Lymphs Abs: 1.4 10*3/uL (ref 0.7–4.0)
MCH: 23 pg — ABNORMAL LOW (ref 26.0–34.0)
MCHC: 30.5 g/dL (ref 30.0–36.0)
MCV: 75.4 fL — ABNORMAL LOW (ref 80.0–100.0)
Monocytes Absolute: 0.5 10*3/uL (ref 0.1–1.0)
Monocytes Relative: 7 %
Neutro Abs: 4.9 10*3/uL (ref 1.7–7.7)
Neutrophils Relative %: 71 %
Platelets: 277 10*3/uL (ref 150–400)
RBC: 5.82 MIL/uL — ABNORMAL HIGH (ref 3.87–5.11)
RDW: 19.9 % — ABNORMAL HIGH (ref 11.5–15.5)
WBC: 7.1 10*3/uL (ref 4.0–10.5)
nRBC: 0 % (ref 0.0–0.2)

## 2019-06-01 LAB — VITAMIN D 25 HYDROXY (VIT D DEFICIENCY, FRACTURES): Vit D, 25-Hydroxy: 110.4 ng/mL — ABNORMAL HIGH (ref 30–100)

## 2019-06-01 LAB — MAGNESIUM: Magnesium: 1.8 mg/dL (ref 1.7–2.4)

## 2019-06-01 MED ORDER — FUROSEMIDE 40 MG PO TABS: ORAL_TABLET | ORAL | 3 refills | Status: DC

## 2019-06-01 NOTE — Progress Notes (Signed)
Clinical Summary [name] is a 73 y.o.female seen today for follow up of the following medical problems.   1. HTN  - chrothalidone 25mg  daily caused uptrend in Cr, lowered to 12.5mg  daily - some issues with low K, we adjusted her supplement in 02/2019  - home bp's 140/80s in early AM/ In the afternoon 110s/80s after meds    2. Chronic diastolic heart failure - echo 08/2014 LVEF 60-65%, abnormal diastolic function indeterminate grade.   - no recent edema - no SOB or DOE.  - has not required her prn lasix  Past Medical History:  Diagnosis Date   Adenomatous colon polyp 2006   excised in 2006 & 2010Due surveillance 06/2013   Anxiety    Anxiety and depression    Asthma    Breast mass    right nipple bengn mass per patient   Breast tumor    Chest discomfort    Chronic back pain    Chronic constipation    Complication of anesthesia    Depression    Diverticulosis    Fibromyalgia    Gastroesophageal reflux disease    Hiatal hernia    History of benign esophageal tumor 2010   granular cell esophageal tumor (Dx 06/2008), resected via EMR 2011, due repeat EGD 02/2012   Hyperlipidemia    Hypertension    Insomnia    Migraines    PONV (postoperative nausea and vomiting)    Psychosis ([place])    Sleep apnea    Stop Bang score of 5. Pt said she was told by Dr. Merlene Laughter that she had "a little bit" of sleep apena, but not bad enough to treat.   Thyroid nodule    Tumor of esophagus      Allergies  Allergen Reactions   Elavil [Amitriptyline] Other (See Comments)    Felt really nervous and felt like something was hold her feet or arm and/ or AM headache on it and back on it and went away off it.    Abilify [Aripiprazole] Other (See Comments)    Dystonic reaction   Codeine Hives, Nausea Only and Other (See Comments)    Feels funny   Latex Hives   Penicillins Hives, Itching and Other (See Comments)    Has patient had a PCN reaction causing  immediate rash, facial/tongue/throat swelling, SOB or lightheadedness with hypotension: Yes Has patient had a PCN reaction causing severe rash involving mucus membranes or skin necrosis: No Has patient had a PCN reaction that required hospitalization No Has patient had a PCN reaction occurring within the last 10 years: No If all of the above answers are "NO", then may proceed with Cephalosporin use.    Polyethylene Glycol Other (See Comments)    Per allergy test   Sulfonamide Derivatives Nausea And Vomiting   Cymbalta [Duloxetine Hcl] Rash   Remeron [Mirtazapine] Other (See Comments)    Caused lots of strange, weird, crazy dreams.     Current Outpatient Medications  Medication Sig Dispense Refill   albuterol (PROVENTIL HFA;VENTOLIN HFA) 108 (90 BASE) MCG/ACT inhaler Inhale 2 puffs into the lungs every 6 (six) hours as needed for wheezing or shortness of breath.      amLODipine (NORVASC) 10 MG tablet Take 10 mg by mouth daily.      atenolol (TENORMIN) 100 MG tablet Take 100 mg by mouth daily.     chlorthalidone (HYGROTON) 25 MG tablet Take 0.5 tablets (12.5 mg total) by mouth daily. 45 tablet 3  donepezil (ARICEPT) 10 MG tablet Take 10 mg by mouth at bedtime.     fluticasone (FLONASE) 50 MCG/ACT nasal spray Place 1 spray into both nostrils daily as needed for allergies.      furosemide (LASIX) 40 MG tablet Take 40 mg daily as needed for swelling. 90 tablet 3   gabapentin (NEURONTIN) 300 MG capsule Take 300 mg by mouth at bedtime.      HYDROcodone-acetaminophen (NORCO/VICODIN) 5-325 MG tablet Take 1 tablet by mouth every 6 (six) hours as needed for moderate pain. 30 tablet 0   lovastatin (MEVACOR) 40 MG tablet Take 40 mg by mouth at bedtime.     Melatonin 3 MG TABS Take 9 mg by mouth at bedtime.      memantine (NAMENDA) 10 MG tablet Take 10 mg by mouth 2 (two) times daily.      metFORMIN (GLUCOPHAGE-XR) 500 MG 24 hr tablet Take 1,000 mg by mouth 2 (two) times daily.        naloxegol oxalate (MOVANTIK) 25 MG TABS tablet Take 1 tablet (25 mg total) by mouth daily. Take 1 hour before breakfast. 30 tablet 3   OLANZapine (ZYPREXA) 7.5 MG tablet Take 7.5 mg by mouth at bedtime.     potassium chloride SA (KLOR-CON M20) 20 MEQ tablet Take 40 meq in the AM & 20 meq in the PM. 270 tablet 3   RABEprazole (ACIPHEX) 20 MG tablet Take 1 tablet (20 mg total) by mouth daily before breakfast. 30 tablet 11   spironolactone (ALDACTONE) 25 MG tablet Take 25 mg by mouth daily.     topiramate (TOPAMAX) 100 MG tablet Take 100 mg by mouth daily.      traZODone (DESYREL) 100 MG tablet Take 300 mg by mouth at bedtime.   3   venlafaxine XR (EFFEXOR-XR) 150 MG 24 hr capsule Take 150 mg by mouth daily with breakfast.      venlafaxine XR (EFFEXOR-XR) 75 MG 24 hr capsule Take 75 mg by mouth daily with breakfast.      Vitamin D, Ergocalciferol, (DRISDOL) 50000 units CAPS capsule Take 50,000 Units by mouth every Wednesday.      No current facility-administered medications for this visit.      Past Surgical History:  Procedure Laterality Date   ABDOMINAL HYSTERECTOMY     BRAVO [place] STUDY  12/11/2011   Procedure: BRAVO [place];  Surgeon: Daneil Dolin, MD;  [facility];  Service: Endoscopy;  Laterality: N/A;   BREAST EXCISIONAL BIOPSY  1990s, 2012   Left x2-sclerosing ductal papilloma-2012   CHOLECYSTECTOMY N/A 04/09/2013   Procedure: LAPAROSCOPIC CHOLECYSTECTOMY;  Surgeon: Jamesetta So, MD;  Location: [facility];  Service: General;  Laterality: N/A;   COLONOSCOPY  06/2008, 06/2011   sigmoid tics, tubular adenoma; 2013: anal canal hemorrhoids   COLONOSCOPY  07/10/2011   Anal canal hemorrhoids likely the cause of hematochezia in the setting of constipation; otherwise normal rectum ;submucosal  petechiae in left colon of doubtful clinical significance; otherwise, normal colon   COLONOSCOPY WITH PROPOFOL N/A 11/10/2017   cancelled in pre-op   COLONOSCOPY WITH  PROPOFOL N/A 07/23/2018   Procedure: COLONOSCOPY WITH PROPOFOL;  Surgeon: Daneil Dolin, MD;  [facility];  Service: Endoscopy;  Laterality: N/A;  9:30am   ESOPHAGEAL DILATION N/A 05/08/2015   Procedure: ESOPHAGEAL DILATION;  Surgeon: Daneil Dolin, MD;  Location: [facility];  Service: Endoscopy;  Laterality: N/AVenia Minks 54/56   ESOPHAGOGASTRODUODENOSCOPY  12/11/2011   [id number] Schatzki's  ring; otherwise normal/Small hiatal hernia. Antral and bulbar erosions   ESOPHAGOGASTRODUODENOSCOPY (EGD) WITH PROPOFOL N/A 05/08/2015   Dr. Gala Romney: mild erosive reflux esophagitis, non-critical Schatzki's ring s/p dilation. Hiatal hernia.    EUS  08/2010   Dr [facility] with EGD. Retained food. No recurrent esophageal lesion, bx negative.   LUNG BIOPSY     negative   PARATHYROIDECTOMY  April 2017   [facility].    POLYPECTOMY  07/23/2018   Procedure: POLYPECTOMY;  Surgeon: Daneil Dolin, MD;  [facility];  Service: Endoscopy;;  colon   RIGHT OOPHORECTOMY     benign disease   SHOULDER ARTHROSCOPY     Right; bone spurs removed   TONSILLECTOMY     TOTAL KNEE ARTHROPLASTY  2002   Right; previous arthroscopic surgery     Allergies  Allergen Reactions   Elavil [Amitriptyline] Other (See Comments)    Felt really nervous and felt like something was hold her feet or arm and/ or AM headache on it and back on it and went away off it.    Abilify [Aripiprazole] Other (See Comments)    Dystonic reaction   Codeine Hives, Nausea Only and Other (See Comments)    Feels funny   Latex Hives   Penicillins Hives, Itching and Other (See Comments)    Has patient had a PCN reaction causing immediate rash, facial/tongue/throat swelling, SOB or lightheadedness with hypotension: Yes Has patient had a PCN reaction causing severe rash involving mucus membranes or skin necrosis: No Has patient had a PCN reaction that required hospitalization No Has patient had a PCN reaction occurring  within the last 10 years: No If all of the above answers are "NO", then may proceed with Cephalosporin use.    Polyethylene Glycol Other (See Comments)    Per allergy test   Sulfonamide Derivatives Nausea And Vomiting   Cymbalta [Duloxetine Hcl] Rash   Remeron [Mirtazapine] Other (See Comments)    Caused lots of strange, weird, crazy dreams.      Family History  Problem Relation Age of Onset   Stroke Father    Alcohol abuse Father    Heart attack Mother    Depression Mother    Anxiety disorder Mother    Colon cancer Paternal Aunt    Colon cancer Paternal Uncle    Dementia Maternal Uncle    ADD / ADHD Neg Hx    Bipolar disorder Neg Hx    Drug abuse Neg Hx    OCD Neg Hx    Paranoid behavior Neg Hx    Schizophrenia Neg Hx    Seizures Neg Hx    Sexual abuse Neg Hx    Physical abuse Neg Hx      Social History [name] reports that she has never smoked. She has never used smokeless tobacco. [name] reports no history of alcohol use.   Review of Systems CONSTITUTIONAL: No weight loss, fever, chills, weakness or fatigue.  HEENT: Eyes: No visual loss, blurred vision, double vision or yellow sclerae.No hearing loss, sneezing, congestion, runny nose or sore throat.  SKIN: No rash or itching.  CARDIOVASCULAR: per hpi RESPIRATORY: No shortness of breath, cough or sputum.  GASTROINTESTINAL: No anorexia, nausea, vomiting or diarrhea. No abdominal pain or blood.  GENITOURINARY: No burning on urination, no polyuria NEUROLOGICAL: No headache, dizziness, syncope, paralysis, ataxia, numbness or tingling in the extremities. No change in bowel or bladder control.  MUSCULOSKELETAL: No muscle, back pain, joint pain or stiffness.  LYMPHATICS: No enlarged  nodes. No history of splenectomy.  PSYCHIATRIC: No history of depression or anxiety.  ENDOCRINOLOGIC: No reports of sweating, cold or heat intolerance. No polyuria or polydipsia.  Marland Kitchen   Physical  Examination Today's Vitals   06/01/19 0953  BP: 140/86  Pulse: (!) 110  Temp: (!) 97.1 F (36.2 C)  TempSrc: Temporal  SpO2: 98%  Weight: 175 lb (79.4 kg)  Height: 5\' 2"  (1.575 m)   Body mass index is 32.01 kg/m.  Gen: resting comfortably, no acute distress HEENT: no scleral icterus, pupils equal round and reactive, no palptable cervical adenopathy,  CV: RRR, no m/r/g no jvd Resp: Clear to auscultation bilaterally GI: abdomen is soft, non-tender, non-distended, normal bowel sounds, no hepatosplenomegaly MSK: extremities are warm, no edema.  Skin: warm, no rash Neuro:  no focal deficits Psych: appropriate affect   Diagnostic Studies 02/17/13 Event monitor: baseline sinus brady in 50s, symptoms of dizziness correlate w/ sinus brady in 50s. Occasional PVCs, 1 PVC couplet.   01/2012 Carotid US: minimal plaque at bifurcations, no stenosis   10/2014 echo Study Conclusions  - Left ventricle: The cavity size was normal. Wall thickness was increased in a pattern of mild LVH. Systolic function was normal. The estimated ejection fraction was in the range of 60% to 65%. Abnormal diastoilc function, indeterminate grade. - Aortic valve: There was mild regurgitation. Valve area (VTI): 2.48 cm^2. Valve area (Vmax): 2.89 cm^2. - Technically adequate study.   Assessment and Plan  1. HTN - afternoon bp's are at goal once AM meds had settled in, continue current meds    2. Chronic diastolic HF - no symptoms, she is euvolemic. COntinue current meds  3. Tachcyardia - elevated HR by dynamap, by manual exam down to 80. Just monitor at this time time.    Obtain annual labs, needs KCl refill based on labs   Arnoldo Lenis, M.D.

## 2019-06-01 NOTE — Patient Instructions (Signed)
Medication Instructions:  Your physician recommends that you continue on your current medications as directed. Please refer to the Current Medication list given to you today.   Labwork: Today  Cbc,cmet, tsh, a1c, magnesium, lipid, vit d  Testing/Procedures: None   Follow-Up: Your physician wants you to follow-up in: 6 months.  You will receive a reminder letter in the mail two months in advance. If you don't receive a letter, please call our office to schedule the follow-up appointment.   Any Other Special Instructions Will Be Listed Below (If Applicable).     If you need a refill on your cardiac medications before your next appointment, please call your pharmacy.

## 2019-09-09 ENCOUNTER — Encounter: Payer: Self-pay | Admitting: Gastroenterology

## 2019-09-09 NOTE — Progress Notes (Signed)
Referring Provider: Andrey Campanile, MD Primary Care Physician:  Andrey Campanile, MD Primary GI Physician: Dr. Gala Romney  Chief Complaint  Patient presents with  . Gastroesophageal Reflux    c/o indigestion, cough x 1 year    HPI:   [name] is a 74 y.o. female presenting today for indigestion. History of GERD, esophageal granular cell tumor s/p EMR in 2011 by Dr. Newman Pies with [facility],  chronic constipation likely opioid induced, and adenomatous colon polyps with last colonoscopy in January 2020 and no recommendations to repeat due to age.  Last EGD in November 2016 with mild erosive reflux esophagitis, noncritical Schatzki ring s/p dilation, hiatal hernia.  She is advised to stop Fosamax at that time and request parenteral agent from PCP for osteoporosis.   Had also been referred to Dr. Newman Pies for uncontrolled GERD in 2014 s/p EGDs, normal GES, and a variety of PPIs. Manometry had been attempted but she was unable to tolerate the probe.   Last seen in our office on 06/01/2018.  Constipation not adequately controlled on Amitiza 24 mcg twice daily and MiraLAX daily.  Previously failed Linzess.  Reported abdominal discomfort in the left lower quadrant/left groin.  AcipHex 20 mg daily was controlling GERD symptoms.  Plans to try Movantik 25 mg daily, consider CT abdomen and pelvis in the future if abdominal pain continued despite improvement in constipation, and proceed with colonoscopy.   Colonoscopy was completed 07/23/2018 with 1 tubular adenoma, diverticulosis in the sigmoid and descending colon, nonbleeding internal hemorrhoids.  No recommendations to repeat.  Reviewed recent PCP note dated 09/06/2019.  Patient was experiencing worsening GERD symptoms on AcipHex 20 mg daily.  Famotidine 20 mg twice daily was added.   Today: Has been having indigestion. Almost unbarable. Started worsening about 2 months ago. Cough she can't get rid of x 1 year. Dr. Tasia Catchings thinks it is coming from acid  reflux. Will have significant burning up through her esophagus. Occurs daily. Not specifically worsened by meals. Comes and goes. Worse at night when laying down. No specific food triggers. Drinking Hawaiian Punch at night. Drinks water during the day. No soda or carbonated beverages. Has coffee at breakfast. Eating fried foods daily. Bacon, fried chicken. No spicy foods. No regular fruits. Taking Aciphex daily. Hasn't started famotidine. Has been on Dexilant, Nexium, omeprazole, Prevacid, and Protonix.   No nausea or vomiting. Intermittent upper abdominal pain associated with esophageal burning. Taking Goody powders, one a day for knee pain and headaches. Has been taking goody powders for years. No black stools. No blood in the stool.  No fosamax. No dysphagia. Intermittent odynophagia. Doesn't eat within 3 hours of laying down.   1 BM every 2-3 weeks. Not taking anything right now for constipation. Ran out of Movantik about 6 months ago. Was having a BM about once a week with Movantik.  Last BM was Sunday (5 days ago). No associated abdominal pain.   Denies fever.  Reports staying cold all the time.  Denies cold or flulike symptoms.  Some lightheadedness with position changes.  No presyncope or syncope.  No chest pain. Occasional palpitations. No shortness of breath. Chronic cough.   Past Medical History:  Diagnosis Date  . Adenomatous colon polyp 2006   excised in 2006 & 2010Due surveillance 06/2013  . Anxiety   . Anxiety and depression   . Asthma   . Breast mass    right nipple bengn mass per patient  . Breast tumor   .  Chest discomfort   . Chronic back pain   . Chronic constipation   . Complication of anesthesia   . Depression   . Diverticulosis   . Fibromyalgia   . Gastroesophageal reflux disease   . Hiatal hernia   . History of benign esophageal tumor 2010   granular cell esophageal tumor (Dx 06/2008), resected via EMR 2011, due repeat EGD 02/2012  . Hyperlipidemia   . Hypertension    . Insomnia   . Migraines   . PONV (postoperative nausea and vomiting)   . Psychosis ([place])   . Sleep apnea    Stop Bang score of 5. Pt said she was told by Dr. Merlene Laughter that she had "a little bit" of sleep apena, but not bad enough to treat.  . Thyroid nodule   . Tumor of esophagus     Past Surgical History:  Procedure Laterality Date  . ABDOMINAL HYSTERECTOMY    . BRAVO [place] STUDY  12/11/2011   Procedure: BRAVO [place];  Surgeon: Daneil Dolin, MD;  [facility];  Service: Endoscopy;  Laterality: N/A;  . 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 CHOLECYSTECTOMY N/A 04/09/2013   Procedure: LAPAROSCOPIC CHOLECYSTECTOMY;  Surgeon: Jamesetta So, MD;  Location: [facility];  Service: General;  Laterality: N/A;  . COLONOSCOPY  06/2008, 06/2011   sigmoid tics, tubular adenoma; 2013: anal canal hemorrhoids  . COLONOSCOPY  07/10/2011   Anal canal hemorrhoids likely the cause of hematochezia in the setting of constipation; otherwise normal rectum ;submucosal  petechiae in left colon of doubtful clinical significance; otherwise, normal colon  . COLONOSCOPY WITH PROPOFOL N/A 11/10/2017   cancelled in pre-op  . COLONOSCOPY WITH PROPOFOL N/A 07/23/2018   Procedure: COLONOSCOPY WITH PROPOFOL;  Surgeon: Daneil Dolin, MD; 1 tubular adenoma, diverticulosis in the sigmoid and descending colon, nonbleeding internal hemorrhoids.  No recommendations to repeat due to age.  . ESOPHAGEAL DILATION N/A 05/08/2015   Procedure: ESOPHAGEAL DILATION;  Surgeon: Daneil Dolin, MD;  Location: [facility];  Service: Endoscopy;  Laterality: N/AVenia Minks 54/56  . ESOPHAGOGASTRODUODENOSCOPY  12/11/2011   [id number] Schatzki's ring; otherwise normal/Small hiatal hernia. Antral and bulbar erosions  . ESOPHAGOGASTRODUODENOSCOPY (EGD) WITH PROPOFOL N/A 05/08/2015   Dr. Gala Romney: mild erosive reflux esophagitis, non-critical Schatzki's ring s/p dilation. Hiatal hernia.   . EUS   08/2010   Dr [place] with EGD. Retained food. No recurrent esophageal lesion, bx negative.  Marland Kitchen LUNG BIOPSY     negative  . PARATHYROIDECTOMY  April 2017   [facility].   Marland Kitchen PARATHYROIDECTOMY    . POLYPECTOMY  07/23/2018   Procedure: POLYPECTOMY;  Surgeon: Daneil Dolin, MD;  [facility];  Service: Endoscopy;;  colon  . RIGHT OOPHORECTOMY     benign disease  . SHOULDER ARTHROSCOPY     Right; bone spurs removed  . TONSILLECTOMY    . TOTAL KNEE ARTHROPLASTY  2002   Right; previous arthroscopic surgery    Current Outpatient Medications  Medication Sig Dispense Refill  . albuterol (PROVENTIL HFA;VENTOLIN HFA) 108 (90 BASE) MCG/ACT inhaler Inhale 2 puffs into the lungs every 6 (six) hours as needed for wheezing or shortness of breath.     Marland Kitchen amLODipine (NORVASC) 10 MG tablet Take 10 mg by mouth daily.     Marland Kitchen atenolol (TENORMIN) 100 MG tablet Take 100 mg by mouth daily.    Marland Kitchen donepezil (ARICEPT) 10 MG tablet Take 10 mg by mouth  at bedtime.    . fluticasone (FLONASE) 50 MCG/ACT nasal spray Place 1 spray into both nostrils daily as needed for allergies.     . furosemide (LASIX) 40 MG tablet Take 40 mg daily as needed for swelling. 90 tablet 3  . gabapentin (NEURONTIN) 300 MG capsule Take 300 mg by mouth at bedtime.     Marland Kitchen HYDROcodone-acetaminophen (NORCO/VICODIN) 5-325 MG tablet Take 1 tablet by mouth every 6 (six) hours as needed for moderate pain. 30 tablet 0  . lovastatin (MEVACOR) 40 MG tablet Take 40 mg by mouth at bedtime.    . Melatonin 3 MG TABS Take 9 mg by mouth at bedtime.     . memantine (NAMENDA) 10 MG tablet Take 10 mg by mouth 2 (two) times daily.     . metFORMIN (GLUCOPHAGE-XR) 500 MG 24 hr tablet Take 1,000 mg by mouth 2 (two) times daily.     Marland Kitchen OLANZapine (ZYPREXA) 7.5 MG tablet Take 7.5 mg by mouth at bedtime.    . potassium chloride SA (KLOR-CON M20) 20 MEQ tablet Take 40 meq in the AM & 20 meq in the PM. 270 tablet 3  . spironolactone (ALDACTONE) 25 MG tablet Take 25  mg by mouth daily.    Marland Kitchen topiramate (TOPAMAX) 100 MG tablet Take 100 mg by mouth daily.     . traZODone (DESYREL) 100 MG tablet Take 300 mg by mouth at bedtime.   3  . venlafaxine XR (EFFEXOR-XR) 150 MG 24 hr capsule Take 150 mg by mouth daily with breakfast.     . venlafaxine XR (EFFEXOR-XR) 75 MG 24 hr capsule Take 75 mg by mouth daily with breakfast.     . Vitamin D, Ergocalciferol, (DRISDOL) 50000 units CAPS capsule Take 50,000 Units by mouth every Wednesday.     . chlorthalidone (HYGROTON) 25 MG tablet Take 0.5 tablets (12.5 mg total) by mouth daily. 45 tablet 3  . naloxegol oxalate (MOVANTIK) 25 MG TABS tablet Take 1 tablet (25 mg total) by mouth daily. Take 1 hour before breakfast. 30 tablet 5  . RABEprazole (ACIPHEX) 20 MG tablet Take 1 tablet (20 mg total) by mouth in the morning and at bedtime. 60 tablet 5  . sucralfate (CARAFATE) 1 GM/10ML suspension Take 10 mLs (1 g total) by mouth 4 (four) times daily -  with meals and at bedtime. 420 mL 3   No current facility-administered medications for this visit.    Allergies as of 09/10/2019 - Review Complete 09/10/2019  Allergen Reaction Noted  . Elavil [amitriptyline] Other (See Comments) 06/03/2012  . Abilify [aripiprazole] Other (See Comments) 11/25/2012  . Codeine Hives, Nausea Only, and Other (See Comments)   . Latex Hives 11/21/2011  . Penicillins Hives, Itching, and Other (See Comments)   . Polyethylene glycol Other (See Comments) 12/26/2012  . Sulfonamide derivatives Nausea And Vomiting   . Cymbalta [duloxetine hcl] Rash 03/10/2013  . Remeron [mirtazapine] Other (See Comments) 05/06/2012    Family History  Problem Relation Age of Onset  . Stroke Father   . Alcohol abuse Father   . Heart attack Mother   . Depression Mother   . Anxiety disorder Mother   . Colon cancer Paternal Aunt   . Colon cancer Paternal Uncle   . Dementia Maternal Uncle   . ADD / ADHD Neg Hx   . Bipolar disorder Neg Hx   . Drug abuse Neg Hx   . OCD  Neg Hx   . Paranoid behavior Neg Hx   .  Schizophrenia Neg Hx   . Seizures Neg Hx   . Sexual abuse Neg Hx   . Physical abuse Neg Hx     Social History   Socioeconomic History  . Marital status: Single    Spouse name: Not on file  . Number of children: 1  . Years of education: Not on file  . Highest education level: Not on file  Occupational History  . Occupation: disabled    Employer: RETIRED  Tobacco Use  . Smoking status: Never Smoker  . Smokeless tobacco: Never Used  Substance and Sexual Activity  . Alcohol use: No    Alcohol/week: 0.0 standard drinks  . Drug use: No  . Sexual activity: Never  Other Topics Concern  . Not on file  Social History Narrative  . Not on file   Social Determinants of Health   Financial Resource Strain:   . Difficulty of Paying Living Expenses:   Food Insecurity:   . Worried About Charity fundraiser in the Last Year:   . Arboriculturist in the Last Year:   Transportation Needs:   . Film/video editor (Medical):   Marland Kitchen Lack of Transportation (Non-Medical):   Physical Activity:   . Days of Exercise per Week:   . Minutes of Exercise per Session:   Stress:   . Feeling of Stress :   Social Connections:   . Frequency of Communication with Friends and Family:   . Frequency of Social Gatherings with Friends and Family:   . Attends Religious Services:   . Active Member of Clubs or Organizations:   . Attends Archivist Meetings:   Marland Kitchen Marital Status:     Review of Systems: Gen: See HPI.  CV: See HPI Resp: See HPI GI: See HPI Derm: Denies rash Psych: Denies depression or anxiety.  Heme: Denies bruising or bleeding  Physical Exam: BP 129/81   Pulse 92   Temp (!) 96.9 F (36.1 C) (Oral)   Ht 5\' 2"  (1.575 m)   Wt 173 lb 9.6 oz (78.7 kg)   BMI 31.75 kg/m  General:   Alert and oriented. No distress noted. Pleasant and cooperative.  Head:  Normocephalic and atraumatic. Eyes:  Conjuctiva clear without scleral  icterus. Heart:  S1, S2 present without murmurs appreciated. Lungs:  Clear to auscultation bilaterally. No wheezes, rales, or rhonchi. No distress.  Abdomen:  +BS, soft, non-tender and non-distended. No rebound or guarding. No HSM or masses noted. Msk:  Symmetrical without gross deformities. Normal posture. Extremities:  Without edema. Neurologic:  Alert and  oriented x4 Psych: Normal mood and affect.

## 2019-09-10 ENCOUNTER — Encounter: Payer: Self-pay | Admitting: *Deleted

## 2019-09-10 ENCOUNTER — Encounter: Payer: Self-pay | Admitting: Gastroenterology

## 2019-09-10 ENCOUNTER — Ambulatory Visit (INDEPENDENT_AMBULATORY_CARE_PROVIDER_SITE_OTHER): Payer: Medicare Other | Admitting: Gastroenterology

## 2019-09-10 ENCOUNTER — Telehealth: Payer: Self-pay | Admitting: *Deleted

## 2019-09-10 ENCOUNTER — Other Ambulatory Visit: Payer: Self-pay

## 2019-09-10 VITALS — BP 129/81 | HR 92 | Temp 96.9°F | Ht 62.0 in | Wt 173.6 lb

## 2019-09-10 DIAGNOSIS — R1310 Dysphagia, unspecified: Secondary | ICD-10-CM | POA: Insufficient documentation

## 2019-09-10 DIAGNOSIS — K219 Gastro-esophageal reflux disease without esophagitis: Secondary | ICD-10-CM | POA: Diagnosis not present

## 2019-09-10 DIAGNOSIS — K5909 Other constipation: Secondary | ICD-10-CM | POA: Diagnosis not present

## 2019-09-10 DIAGNOSIS — R1312 Dysphagia, oropharyngeal phase: Secondary | ICD-10-CM | POA: Insufficient documentation

## 2019-09-10 MED ORDER — NALOXEGOL OXALATE 25 MG PO TABS: 25.0000 mg | ORAL_TABLET | Freq: Every day | ORAL | 5 refills | Status: DC

## 2019-09-10 MED ORDER — RABEPRAZOLE SODIUM 20 MG PO TBEC: 20.0000 mg | DELAYED_RELEASE_TABLET | Freq: Two times a day (BID) | ORAL | 5 refills | Status: DC

## 2019-09-10 MED ORDER — SUCRALFATE 1 GM/10ML PO SUSP: 1.0000 g | Freq: Three times a day (TID) | ORAL | 3 refills | Status: DC

## 2019-09-10 NOTE — Telephone Encounter (Signed)
PA for EGD done via [facility] website. PA approved. Auth# [id number] dates 10/14/2019-01/12/2020

## 2019-09-10 NOTE — Assessment & Plan Note (Addendum)
1 to 2-month history of odynophagia in the setting of uncontrolled GERD and chronic daily Goody powder use.  Also with intermittent epigastric pain associated with GERD symptoms. Currently taking AcipHex 20 mg daily.  GERD has been difficult to manage historically and she has had multiple PPI failures.  Symptoms had been fairly well controlled on AcipHex until recently.  Denies dysphagia, bright red blood per rectum, or melena.  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 Newman Pies.  Last EGD in November 2016 with mild erosive reflux esophagitis, noncritical Schatzki ring s/p dilation, hiatal hernia.  She was on Fosamax at that time which has been discontinued.  Suspect esophagitis secondary to uncontrolled GERD and Goody powders.  However, will pursue EGD for further evaluation.  We will also add Carafate and increase PPI to twice daily dosing.  Proceed with EGD with propofol with Dr. Gala Romney in the near future. The risks, benefits, and alternatives have been discussed in detail with patient. They have stated understanding and desire to proceed.  Start Carafate 1 g 3 times daily with meals and at bedtime. Increase AcipHex to twice daily 30 minutes for breakfast and 30 minutes for dinner. Counseled on GERD diet and lifestyle.  Handout provided. Advised to stop Goody powders and avoid all NSAIDs. Advised to discussed pain management with PCP. Follow-up after EGD.

## 2019-09-10 NOTE — Assessment & Plan Note (Addendum)
Chronic constipation likely secondary to chronic pain medications.  She has failed Linzess and Amitiza.  Had been on Movantik 25 mg daily but ran out about 6 months ago.  Thinks she was having 1 BM a week with Movantik.  Currently having 1 BM every 2-3 weeks.  No associated abdominal pain.  No bright red blood per rectum or melena.  Reviewed recent labs in December 2020 with creatinine 0.85 and GFR >60.   Resume Movantik 25 mg daily. New prescription sent to pharmacy.  She was advised to call us in 2 weeks with a progress report.  Plan to follow-up after EGD for uncontrolled GERD, epigastric pain, and odynophagia as discussed below.

## 2019-09-10 NOTE — Patient Instructions (Addendum)
We will get you scheduled for an upper endoscopy in the near future with Dr. Gala Romney.  Please increase AcipHex to 20 mg twice daily 30 minutes before breakfast and dinner.  I have sent a new prescription to your pharmacy.  We will also try you on Carafate 1 g 3 times daily with meals and at bedtime to see if this helps with your esophageal burning and pain with swallowing.   It is very important that you follow a GERD diet.  Avoid fried, fatty, greasy, spicy, citrus foods and drinks.  Avoid caffeine, carbonated beverages, and chocolate.  Do not eat within 3 hours of laying down.  Prop the head of your bed up on wood or bricks to create a 6 inch incline.  If it is difficult to prop the head of your bed up, you may try sleeping on a wedge (pillows or not adequate).   Please stop Goody powder use.  Avoid all NSAIDs including ibuprofen, Aleve, Advil, and Goody powders.  Discuss pain management with your primary care doctor.  For your constipation, I have sent in a new prescription for Movantik.  Start taking this daily.  Please call with a progress report in 2 weeks to let me know how this is working.  We will follow up with you after your endoscopy.  Call if you have questions or concerns prior.  Aliene Altes, PA-C [facility]    Food Choices for Gastroesophageal Reflux Disease, Adult When you have gastroesophageal reflux disease (GERD), the foods you eat and your eating habits are very important. Choosing the right foods can help ease your discomfort. Think about working with a nutrition specialist (dietitian) to help you make good choices. What are tips for following this plan?  Meals  Choose healthy foods that are low in fat, such as fruits, vegetables, whole grains, low-fat dairy products, and lean meat, fish, and poultry.  Eat small meals often instead of 3 large meals a day. Eat your meals slowly, and in a place where you are relaxed. Avoid bending over or lying down  until 2-3 hours after eating.  Avoid eating meals 2-3 hours before bed.  Avoid drinking a lot of liquid with meals.  Cook foods using methods other than frying. Bake, grill, or broil food instead.  Avoid or limit: ? Chocolate. ? Peppermint or spearmint. ? Alcohol. ? Pepper. ? Black and decaffeinated coffee. ? Black and decaffeinated tea. ? Bubbly (carbonated) soft drinks. ? Caffeinated energy drinks and soft drinks.  Limit high-fat foods such as: ? Fatty meat or fried foods. ? Whole milk, cream, butter, or ice cream. ? Nuts and nut butters. ? Pastries, donuts, and sweets made with butter or shortening.  Avoid foods that cause symptoms. These foods may be different for everyone. Common foods that cause symptoms include: ? Tomatoes. ? Oranges, lemons, and limes. ? Peppers. ? Spicy food. ? Onions and garlic. ? Vinegar. Lifestyle  Maintain a healthy weight. Ask your doctor what weight is healthy for you. If you need to lose weight, work with your doctor to do so safely.  Exercise for at least 30 minutes for 5 or more days each week, or as told by your doctor.  Wear loose-fitting clothes.  Do not smoke. If you need help quitting, ask your doctor.  Sleep with the head of your bed higher than your feet. Use a wedge under the mattress or blocks under the bed frame to raise the head of the bed. Summary  When you  have gastroesophageal reflux disease (GERD), food and lifestyle choices are very important in easing your symptoms.  Eat small meals often instead of 3 large meals a day. Eat your meals slowly, and in a place where you are relaxed.  Limit high-fat foods such as fatty meat or fried foods.  Avoid bending over or lying down until 2-3 hours after eating.  Avoid peppermint and spearmint, caffeine, alcohol, and chocolate. This information is not intended to replace advice given to you by your health care provider. Make sure you discuss any questions you have with your  health care provider. Document Revised: 10/01/2018 Document Reviewed: 07/16/2016 Elsevier Patient Education  [place].

## 2019-09-10 NOTE — Assessment & Plan Note (Addendum)
74 year old female with chronic history of GERD and multiple PPI failures.  Symptoms had been fairly well controlled on AcipHex 20 mg daily; however, she reports 43-month history of worsening reflux symptoms with burning in her esophagus up to her throat.  Symptoms are typically worse at night. Associated odynophagia and intermittent epigastric pain.  No dysphagia. Not currently following a GERD diet.  Consuming Goody powders daily for knee pain and headaches.  Denies bright red blood per rectum or melena.  Abdominal exam is benign.  History is also significant for benign esophageal granular tumor s/p EMR in 2011 with Dr. Newman Pies.  Last EGD in November 2016 with mild erosive reflux esophagitis, noncritical Schatzki ring s/p dilation, and hiatal hernia.  She was on Fosamax at that time which has been discontinued.  Esophageal manometry had been attempted in the past but patient was unable to tolerate the probe.  It appears patient has had multiple PPI failures on once daily dosing.  As AcipHex has worked best for her, I will increase this to twice daily dosing.  Suspect dietary changes will also help with reflux symptoms.  With chronic daily Goody powder use, concerns for esophagitis, gastritis, and possible PUD which needs further evaluation with EGD.   Increase AcipHex to 20 mg twice daily 30 minutes before breakfast and dinner. Start Carafate 1 g 3 times daily and before bed to see if this helps with odynophagia and esophageal burning. Advised to stop Goody powders and avoid all NSAIDs. Discuss pain management with PCP. Follow GERD diet.  Avoid fried, fatty, greasy, spicy, citrus foods.  Avoid citrus drinks.  Avoid carbonated beverages, caffeine, and chocolate.  Do not eat within 3 hours of laying down.  Prop the head of the bed up to create a 6 inch incline or try sleeping on a wedge. Proceed with EGD with propofol with Dr. Gala Romney in the near future for evaluation of odynophagia, uncontrolled GERD, and  epigastric pain. The risks, benefits, and alternatives have been discussed in detail with patient. They have stated understanding and desire to proceed.  Follow-up after EGD.

## 2019-09-13 ENCOUNTER — Encounter: Payer: Self-pay | Admitting: *Deleted

## 2019-09-14 ENCOUNTER — Telehealth: Payer: Self-pay

## 2019-09-14 NOTE — Telephone Encounter (Signed)
Patient had previously tried and failed Linzess and Amitiza. Is there a PA that can be completed for Movantik?

## 2019-09-14 NOTE — Telephone Encounter (Signed)
Letter received from [employer]. Movantik 25 mg isn't covered under pts insurance. Covered meds are Amitiza 24 mcg, Lubiorstone 24 mcg, Lactulose sol 10 GM/15 ml. Pt was previously on Amitiza and medication was changed. Please advise if pts medication needs to be changed to the covered medications.

## 2019-09-16 ENCOUNTER — Other Ambulatory Visit: Payer: Self-pay

## 2019-09-20 NOTE — Telephone Encounter (Signed)
Made note on fax, our office didn't prescribe Atenolol. Faxed back to [facility] (fax [phone])

## 2019-09-20 NOTE — Telephone Encounter (Signed)
PA is pending. Waiting on an approval or denial. [id number]

## 2019-09-21 NOTE — Telephone Encounter (Signed)
Are we using the correct diagnosis? Movantik is for opioid-induced constipation.   Would prefer not to use Lactulose if we don't absolutely have to. If we are using the correct diagnosis and we can't get approval for Movantik, can you see if Trulance is covered?

## 2019-09-21 NOTE — Telephone Encounter (Signed)
PA for Movantik was denied through pts insurance. Pt has tried Amitiza and Linzess. Movantik was denied under part D Medicare and Movantik isn't FDA approved per pts insurance. Medication isn't a part D drug.  An appeal can be submitted, please advise.

## 2019-09-22 ENCOUNTER — Other Ambulatory Visit: Payer: Self-pay | Admitting: Gastroenterology

## 2019-09-22 DIAGNOSIS — K5909 Other constipation: Secondary | ICD-10-CM

## 2019-09-22 MED ORDER — TRULANCE 3 MG PO TABS: 3.0000 mg | ORAL_TABLET | Freq: Every day | ORAL | 3 refills | Status: DC

## 2019-09-22 NOTE — Telephone Encounter (Signed)
Trulance 3 mg daily sent to her pharmacy.

## 2019-09-22 NOTE — Telephone Encounter (Signed)
Yes Opioid induced constipation was used. They said it's not a covered drug under medicare part D. You can submit for Trulance. I'm not sure that it can be covered for pt. I will submit a PA if needed.

## 2019-09-22 NOTE — Telephone Encounter (Signed)
Noted. Pt is aware Movatink wasn't approved and we will try another medication Trulance to see if it can get covered.

## 2019-10-04 NOTE — Telephone Encounter (Signed)
PA done for trulance and approved. Faxed to mail order pharmacy.

## 2019-10-05 ENCOUNTER — Telehealth: Payer: Self-pay | Admitting: Internal Medicine

## 2019-10-05 NOTE — Telephone Encounter (Signed)
Pt called asking to speak with the scheduler about her procedure.

## 2019-10-05 NOTE — Telephone Encounter (Signed)
Pt needed to know dates and times of her pre op, covid test and procedure. She will stop by to pick up her instructions.

## 2019-10-08 ENCOUNTER — Telehealth: Payer: Self-pay | Admitting: Internal Medicine

## 2019-10-08 NOTE — Telephone Encounter (Signed)
Patient called and said her trulance needed prior auth before they will fill it

## 2019-10-11 ENCOUNTER — Encounter (HOSPITAL_COMMUNITY)
Admission: RE | Admit: 2019-10-11 | Discharge: 2019-10-11 | Disposition: A | Discharge status: Home / Self Care | Payer: Medicare Other | Source: Ambulatory Visit | Attending: Internal Medicine | Admitting: Internal Medicine

## 2019-10-11 ENCOUNTER — Other Ambulatory Visit (HOSPITAL_COMMUNITY)
Admission: RE | Admit: 2019-10-11 | Discharge: 2019-10-11 | Disposition: A | Discharge status: Home / Self Care | Payer: Medicare Other | Source: Ambulatory Visit | Attending: Internal Medicine | Admitting: Internal Medicine

## 2019-10-11 ENCOUNTER — Other Ambulatory Visit: Payer: Self-pay

## 2019-10-11 DIAGNOSIS — Z01812 Encounter for preprocedural laboratory examination: Secondary | ICD-10-CM | POA: Insufficient documentation

## 2019-10-11 DIAGNOSIS — Z20822 Contact with and (suspected) exposure to covid-19: Secondary | ICD-10-CM | POA: Insufficient documentation

## 2019-10-11 LAB — BASIC METABOLIC PANEL
Anion gap: 17 — ABNORMAL HIGH (ref 5–15)
BUN: 13 mg/dL (ref 8–23)
CO2: 20 mmol/L — ABNORMAL LOW (ref 22–32)
Calcium: 9.9 mg/dL (ref 8.9–10.3)
Chloride: 101 mmol/L (ref 98–111)
Creatinine, Ser: 0.94 mg/dL (ref 0.44–1.00)
GFR calc Af Amer: >60 mL/min (ref >60)
GFR calc non Af Amer: >60 mL/min (ref >60)
Glucose, Bld: 102 mg/dL — ABNORMAL HIGH (ref 70–99)
Potassium: 3 mmol/L — ABNORMAL LOW (ref 3.5–5.1)
Sodium: 138 mmol/L (ref 135–145)

## 2019-10-11 LAB — SARS CORONAVIRUS 2 (TAT 6-24 HRS): SARS Coronavirus 2: NEGATIVE

## 2019-10-11 NOTE — Patient Instructions (Signed)
[name]  10/11/2019     @PREFPERIOPPHARMACY @   Your procedure is scheduled on  10/14/2019 .  Report to [name] at  0700  A.M.  Call this number if you have problems the morning of surgery:  [phone]   Remember:  Follow the diet and prep instructions given to you by dr Roseanne Kaufman office.                      Take these medicines the morning of surgery with A SIP OF WATER amlodipine, atenolol, gabapentin, hydrocodone(if needed), namenda, aciphex, effexor.    Do not wear jewelry, make-up or nail polish.  Do not wear lotions, powders, or perfumes. Please wear deodorant and brush your teeth.  Do not shave 48 hours prior to surgery.  Men may shave face and neck.  Do not bring valuables to the hospital.  [facility] is not responsible for any belongings or valuables.  Contacts, dentures or bridgework may not be worn into surgery.  Leave your suitcase in the car.  After surgery it may be brought to your room.  For patients admitted to the hospital, discharge time will be determined by your treatment team.  Patients discharged the day of surgery will not be allowed to drive home.   Name and phone number of your driver:   family Special instructions:  DO NOT smoke the day of your procedure.  Please read over the following fact sheets that you were given. Anesthesia Post-op Instructions and Care and Recovery After Surgery       Upper Endoscopy, Adult, Care After This sheet gives you information about how to care for yourself after your procedure. Your health care provider may also give you more specific instructions. If you have problems or questions, contact your health care provider. What can I expect after the procedure? After the procedure, it is common to have:  A sore throat.  Mild stomach pain or discomfort.  Bloating.  Nausea. Follow these instructions at home:   Follow instructions from your health care provider about what to eat or drink after  your procedure.  Return to your normal activities as told by your health care provider. Ask your health care provider what activities are safe for you.  Take over-the-counter and prescription medicines only as told by your health care provider.  Do not drive for 24 hours if you were given a sedative during your procedure.  Keep all follow-up visits as told by your health care provider. This is important. Contact a health care provider if you have:  A sore throat that lasts longer than one day.  Trouble swallowing. Get help right away if:  You vomit blood or your vomit looks like coffee grounds.  You have: ? A fever. ? Bloody, black, or tarry stools. ? A severe sore throat or you cannot swallow. ? Difficulty breathing. ? Severe pain in your chest or abdomen. Summary  After the procedure, it is common to have a sore throat, mild stomach discomfort, bloating, and nausea.  Do not drive for 24 hours if you were given a sedative during the procedure.  Follow instructions from your health care provider about what to eat or drink after your procedure.  Return to your normal activities as told by your health care provider. This information is not intended to replace advice given to you by your health care provider. Make sure you discuss any questions you have with your  health care provider. Document Revised: 12/02/2017 Document Reviewed: 11/10/2017 Elsevier Patient Education  2020 [place] After These instructions provide you with information about caring for yourself after your procedure. Your health care provider may also give you more specific instructions. Your treatment has been planned according to current medical practices, but problems sometimes occur. Call your health care provider if you have any problems or questions after your procedure. What can I expect after the procedure? After your procedure, you may:  Feel sleepy for several  hours.  Feel clumsy and have poor balance for several hours.  Feel forgetful about what happened after the procedure.  Have poor judgment for several hours.  Feel nauseous or vomit.  Have a sore throat if you had a breathing tube during the procedure. Follow these instructions at home: For at least 24 hours after the procedure:      Have a responsible adult stay with you. It is important to have someone help care for you until you are awake and alert.  Rest as needed.  Do not: ? Participate in activities in which you could fall or become injured. ? Drive. ? Use heavy machinery. ? Drink alcohol. ? Take sleeping pills or medicines that cause drowsiness. ? Make important decisions or sign legal documents. ? Take care of children on your own. Eating and drinking  Follow the diet that is recommended by your health care provider.  If you vomit, drink water, juice, or soup when you can drink without vomiting.  Make sure you have little or no nausea before eating solid foods. General instructions  Take over-the-counter and prescription medicines only as told by your health care provider.  If you have sleep apnea, surgery and certain medicines can increase your risk for breathing problems. Follow instructions from your health care provider about wearing your sleep device: ? Anytime you are sleeping, including during daytime naps. ? While taking prescription pain medicines, sleeping medicines, or medicines that make you drowsy.  If you smoke, do not smoke without supervision.  Keep all follow-up visits as told by your health care provider. This is important. Contact a health care provider if:  You keep feeling nauseous or you keep vomiting.  You feel light-headed.  You develop a rash.  You have a fever. Get help right away if:  You have trouble breathing. Summary  For several hours after your procedure, you may feel sleepy and have poor judgment.  Have a  responsible adult stay with you for at least 24 hours or until you are awake and alert. This information is not intended to replace advice given to you by your health care provider. Make sure you discuss any questions you have with your health care provider. Document Revised: 09/08/2017 Document Reviewed: 10/01/2015 Elsevier Patient Education  [place].

## 2019-10-12 NOTE — Telephone Encounter (Signed)
Spoke with pt. Pt was able to get the Trulance from her pharmacy. When approval letter is received, it will be scanned in chart.

## 2019-10-12 NOTE — Pre-Procedure Instructions (Signed)
Potassium results routed to [facility], due to Dr Gala Romney being out of town.

## 2019-10-12 NOTE — Pre-Procedure Instructions (Signed)
Dr Briant Cedar aware of potassium, will do Istat on arrival.

## 2019-10-13 ENCOUNTER — Telehealth: Payer: Self-pay | Admitting: Internal Medicine

## 2019-10-13 ENCOUNTER — Telehealth: Payer: Self-pay | Admitting: Emergency Medicine

## 2019-10-13 ENCOUNTER — Other Ambulatory Visit: Payer: Self-pay

## 2019-10-13 ENCOUNTER — Other Ambulatory Visit: Payer: Self-pay | Admitting: Cardiology

## 2019-10-13 DIAGNOSIS — R1310 Dysphagia, unspecified: Secondary | ICD-10-CM

## 2019-10-13 DIAGNOSIS — K219 Gastro-esophageal reflux disease without esophagitis: Secondary | ICD-10-CM

## 2019-10-13 MED ORDER — SUCRALFATE 1 GM/10ML PO SUSP: 1.0000 g | Freq: Three times a day (TID) | ORAL | 1 refills | Status: DC

## 2019-10-13 MED ORDER — POTASSIUM CHLORIDE CRYS ER 20 MEQ PO TBCR: EXTENDED_RELEASE_TABLET | ORAL | 3 refills | Status: DC

## 2019-10-13 NOTE — Telephone Encounter (Signed)
Patient called and said that her potassium needs to be sent to [facility] pharmacy.  She is out

## 2019-10-13 NOTE — Telephone Encounter (Signed)
I called the pt and clarified with her that she needs to take her potassium differently today and in the AM than what she was normally taking. We went over those instructions and she is aware that they instructions will be on the bottle. Also advised her to call her pcp or whomever normally prescribes potassium for her and let them know and they can refill her normal dosage.  Pt stated she understood.

## 2019-10-13 NOTE — Telephone Encounter (Signed)
Refilled potassium

## 2019-10-13 NOTE — Telephone Encounter (Signed)
Message sent to the provider . See notes

## 2019-10-13 NOTE — Telephone Encounter (Signed)
Pt stated she did reach out to [facility] office but it will be 48 hours before they are able to do a refill on medication

## 2019-10-13 NOTE — Telephone Encounter (Signed)
Pt called verified name and dob pt request a refill on potassuim chloride SA 20 MEQ TAB . Notified pt that is something doctor [facility] usually writes for her  And he placed her on it in oct. Asked pt to please reach out to him to refill. Pt stated provider is in [place] and she is not able to get in touch with him to refill it until later on this week. Notified pt that the last provider who saw her was Cyril Mourning and she is out of the office but I would reach out to someone to see if it could be filled

## 2019-10-13 NOTE — Telephone Encounter (Signed)
Please see documentation noted under lab result note (AB, Almyra Free, and Elmo Putt have already addressed). Please make sure patient aware RX has been sent in and make sure she knows her instructions.   Thanks!

## 2019-10-13 NOTE — Telephone Encounter (Signed)
*  STAT* If patient is at the pharmacy, call can be transferred to refill team.   1. Which medications need to be refilled? potassium chloride SA (KLOR-CON M20) 20 MEQ tablet   2. Which pharmacy/location (including street and city if local pharmacy) is medication to be sent to?  [place]  3. Do they need a 30 day or 90 day supply?

## 2019-10-13 NOTE — Telephone Encounter (Signed)
Noted. Notified patient rx refill was sent into [place] pharmacy by [facility] office and notified pt how to take    Take 40 meq in the AM & 20 meq in the PM.

## 2019-10-14 ENCOUNTER — Ambulatory Visit (HOSPITAL_COMMUNITY): Payer: Medicare Other | Admitting: Anesthesiology

## 2019-10-14 ENCOUNTER — Encounter (HOSPITAL_COMMUNITY)
Admission: RE | Disposition: A | Discharge status: Home / Self Care | Payer: Self-pay | Source: Home / Self Care | Attending: Internal Medicine

## 2019-10-14 ENCOUNTER — Ambulatory Visit (HOSPITAL_COMMUNITY)
Admission: RE | Admit: 2019-10-14 | Discharge: 2019-10-14 | Disposition: A | Discharge status: Home / Self Care | Payer: Medicare Other | Attending: Internal Medicine | Admitting: Internal Medicine

## 2019-10-14 ENCOUNTER — Encounter (HOSPITAL_COMMUNITY): Payer: Self-pay | Admitting: Internal Medicine

## 2019-10-14 ENCOUNTER — Other Ambulatory Visit: Payer: Self-pay

## 2019-10-14 DIAGNOSIS — Z7984 Long term (current) use of oral hypoglycemic drugs: Secondary | ICD-10-CM | POA: Diagnosis not present

## 2019-10-14 DIAGNOSIS — K449 Diaphragmatic hernia without obstruction or gangrene: Secondary | ICD-10-CM | POA: Diagnosis not present

## 2019-10-14 DIAGNOSIS — F209 Schizophrenia, unspecified: Secondary | ICD-10-CM | POA: Insufficient documentation

## 2019-10-14 DIAGNOSIS — M797 Fibromyalgia: Secondary | ICD-10-CM | POA: Insufficient documentation

## 2019-10-14 DIAGNOSIS — R12 Heartburn: Secondary | ICD-10-CM | POA: Insufficient documentation

## 2019-10-14 DIAGNOSIS — F329 Major depressive disorder, single episode, unspecified: Secondary | ICD-10-CM | POA: Diagnosis not present

## 2019-10-14 DIAGNOSIS — G4730 Sleep apnea, unspecified: Secondary | ICD-10-CM | POA: Diagnosis not present

## 2019-10-14 DIAGNOSIS — K2950 Unspecified chronic gastritis without bleeding: Secondary | ICD-10-CM | POA: Diagnosis not present

## 2019-10-14 DIAGNOSIS — K219 Gastro-esophageal reflux disease without esophagitis: Secondary | ICD-10-CM | POA: Diagnosis not present

## 2019-10-14 DIAGNOSIS — F419 Anxiety disorder, unspecified: Secondary | ICD-10-CM | POA: Diagnosis not present

## 2019-10-14 DIAGNOSIS — J45909 Unspecified asthma, uncomplicated: Secondary | ICD-10-CM | POA: Diagnosis not present

## 2019-10-14 DIAGNOSIS — Z8249 Family history of ischemic heart disease and other diseases of the circulatory system: Secondary | ICD-10-CM | POA: Insufficient documentation

## 2019-10-14 DIAGNOSIS — E785 Hyperlipidemia, unspecified: Secondary | ICD-10-CM | POA: Insufficient documentation

## 2019-10-14 DIAGNOSIS — Z86010 Personal history of colonic polyps: Secondary | ICD-10-CM | POA: Diagnosis not present

## 2019-10-14 DIAGNOSIS — R519 Headache, unspecified: Secondary | ICD-10-CM | POA: Insufficient documentation

## 2019-10-14 DIAGNOSIS — Z79899 Other long term (current) drug therapy: Secondary | ICD-10-CM | POA: Insufficient documentation

## 2019-10-14 DIAGNOSIS — I10 Essential (primary) hypertension: Secondary | ICD-10-CM | POA: Insufficient documentation

## 2019-10-14 DIAGNOSIS — K222 Esophageal obstruction: Secondary | ICD-10-CM | POA: Diagnosis not present

## 2019-10-14 DIAGNOSIS — R1310 Dysphagia, unspecified: Secondary | ICD-10-CM | POA: Diagnosis not present

## 2019-10-14 HISTORY — PX: BIOPSY: SHX5522

## 2019-10-14 HISTORY — PX: ESOPHAGOGASTRODUODENOSCOPY (EGD) WITH PROPOFOL: SHX5813

## 2019-10-14 SURGERY — ESOPHAGOGASTRODUODENOSCOPY (EGD) WITH PROPOFOL
Anesthesia: General

## 2019-10-14 MED ORDER — CHLORHEXIDINE GLUCONATE CLOTH 2 % EX PADS: 6.0000 | MEDICATED_PAD | Freq: Once | CUTANEOUS | Status: DC

## 2019-10-14 MED ORDER — PROPOFOL 500 MG/50ML IV EMUL
INTRAVENOUS | Status: DC | PRN
  Administered 2019-10-14: 150 ug/kg/min via INTRAVENOUS

## 2019-10-14 MED ORDER — PROPOFOL 10 MG/ML IV BOLUS
INTRAVENOUS | Status: AC
  Filled 2019-10-14: qty 40

## 2019-10-14 MED ORDER — PROPOFOL 10 MG/ML IV BOLUS
INTRAVENOUS | Status: DC | PRN
  Administered 2019-10-14 (×2): 20 mg via INTRAVENOUS
  Administered 2019-10-14: 40 mg via INTRAVENOUS

## 2019-10-14 MED ORDER — LACTATED RINGERS IV SOLN: INTRAVENOUS | Status: DC

## 2019-10-14 NOTE — Op Note (Signed)
[facility] Patient Name: [name] Procedure Date: 10/14/2019 8:40 AM MRN: [id number] Date of Birth: [birth year] Attending MD: Norvel Richards , MD CSN: [id number] Age: 74 Admit Type: Outpatient Procedure:                Upper GI endoscopy Indications:              Odynophagia, Heartburn Providers:                Norvel Richards, MD, Janeece Riggers, RN, Nelma Rothman, Technician Referring MD:             Dixie Dials. Yu MD Medicines:                Propofol per Anesthesia Complications:            No immediate complications. Estimated Blood Loss:     Estimated blood loss: Minimal Procedure:                Pre-Anesthesia Assessment:                           - Prior to the procedure, a History and Physical                            was performed, and patient medications and                            allergies were reviewed. The patient's tolerance of                            previous anesthesia was also reviewed. The risks                            and benefits of the procedure and the sedation                            options and risks were discussed with the patient.                            All questions were answered, and informed consent                            was obtained. Prior Anticoagulants: The patient has                            taken no previous anticoagulant or antiplatelet                            agents. ASA Grade Assessment: II - A patient with                            mild systemic disease. After reviewing the risks  and benefits, the patient was deemed in                            satisfactory condition to undergo the procedure.                           After obtaining informed consent, the endoscope was                            passed under direct vision. Throughout the                            procedure, the patient's blood pressure, pulse, and                            oxygen  saturations were monitored continuously. The                            [id number]) was introduced through the                            mouth, and advanced to the second part of duodenum.                            The upper GI endoscopy was accomplished without                            difficulty. The patient tolerated the procedure                            well. Scope In: 8:59:00 AM Scope Out: 9:05:03 AM Total Procedure Duration: 0 hours 6 minutes 3 seconds  Findings:      A non-obstructing Schatzki ring was found at the gastroesophageal       junction. Esophagus otherwise appeared normal.      A small hiatal hernia was present. Patchy erythema throughout the       gastric mucosa. No ulcer or infiltrating process seen. This was biopsied       with a cold forceps for histology. Estimated blood loss was minimal.      The duodenal bulb and second portion of the duodenum were normal. Impression:               - Non-obstructing Schatzki ring.                           - Small hiatal hernia. Abnormal gastric mucosa.                            Biopsied. Patient readily admits to taking 3 Goody                            powders every day for chronic headaches. This may                            well only to explain  her refractory upper GI tract                            symptoms.                           - Normal duodenal bulb and second portion of the                            duodenum. Moderate Sedation:      Moderate (conscious) sedation was personally administered by an       anesthesia professional. The following parameters were monitored: oxygen       saturation, heart rate, blood pressure, respiratory rate, EKG, adequacy       of pulmonary ventilation, and response to care. Recommendation:           - Patient has a contact number available for                            emergencies. The signs and symptoms of potential                            delayed complications  were discussed with the                            patient. Return to normal activities tomorrow.                            Written discharge instructions were provided to the                            patient.                           - Resume previous diet.                           - Continue present medications. Pacifically,                            continue rabeprazole and Carafate. Stop using Goody                            powders. Patient to see primary care physician to                            delve into why he is having daily headaches.-                            Return to my office in 1 month. Follow-up on                            pathology. Procedure Code(s):        --- Professional ---  [id number], Esophagogastroduodenoscopy, flexible,                            transoral; with biopsy, single or multiple Diagnosis Code(s):        --- Professional ---                           K22.2, Esophageal obstruction                           K44.9, Diaphragmatic hernia without obstruction or                            gangrene                           R13.10, Dysphagia, unspecified                           R12, Heartburn CPT copyright 2019 American Medical Association. All rights reserved. The codes documented in this report are preliminary and upon coder review may  be revised to meet current compliance requirements. Cristopher Estimable. Cason Dabney, MD Norvel Richards, MD 10/14/2019 9:18:04 AM This report has been signed electronically. Number of Addenda: 0

## 2019-10-14 NOTE — Discharge Instructions (Signed)
EGD Discharge instructions Please read the instructions outlined below and refer to this sheet in the next few weeks. These discharge instructions provide you with general information on caring for yourself after you leave the hospital. Your doctor may also give you specific instructions. While your treatment has been planned according to the most current medical practices available, unavoidable complications occasionally occur. If you have any problems or questions after discharge, please call your doctor. ACTIVITY  You may resume your regular activity but move at a slower pace for the next 24 hours.   Take frequent rest periods for the next 24 hours.   Walking will help expel (get rid of) the air and reduce the bloated feeling in your abdomen.   No driving for 24 hours (because of the anesthesia (medicine) used during the test).   You may shower.   Do not sign any important legal documents or operate any machinery for 24 hours (because of the anesthesia used during the test).  NUTRITION  Drink plenty of fluids.   You may resume your normal diet.   Begin with a light meal and progress to your normal diet.   Avoid alcoholic beverages for 24 hours or as instructed by your caregiver.  MEDICATIONS  You may resume your normal medications unless your caregiver tells you otherwise.  WHAT YOU CAN EXPECT TODAY  You may experience abdominal discomfort such as a feeling of fullness or gas pains.  FOLLOW-UP  Your doctor will discuss the results of your test with you.  SEEK IMMEDIATE MEDICAL ATTENTION IF ANY OF THE FOLLOWING OCCUR:  Excessive nausea (feeling sick to your stomach) and/or vomiting.   Severe abdominal pain and distention (swelling).   Trouble swallowing.   Temperature over 101 F (37.8 C).   Rectal bleeding or vomiting of blood.    Continue rabeprazole 20 mg twice daily  Continue Carafate daily  Stop using Goody powders.  They are damaging your stomach.  See  your primary care doctor about why you have headaches every day  Office visit with [place] in 1 month  Further recommendations to follow pending review of pathology report  There is conflicting information in the chart regarding proper dosing of Effexor and trazodone.  Please check with the prescribing physician on the proper dosing regimen.  At patient request, I called Sister Enid Derry at [phone] and reviewed results.

## 2019-10-14 NOTE — H&P (Signed)
@LOGO @   Primary Care Physician:  Andrey Campanile, MD Primary Gastroenterologist:  Dr. Gala Romney  Pre-Procedure History & Physical: HPI:  [name] is a 74 y.o. female here for further evaluation of epigastric esophageal burning.  Pain on swallowing.  But no dysphagia.  Better with Carafate and AcipHex.  Patient admits to taking 3 Goody powders DAILYY for chronic headache.  Past Medical History:  Diagnosis Date  . Adenomatous colon polyp 2006   excised in 2006 & 2010Due surveillance 06/2013  . Anxiety   . Anxiety and depression   . Asthma   . Breast mass    right nipple bengn mass per patient  . Breast tumor   . Chest discomfort   . Chronic back pain   . Chronic constipation   . Complication of anesthesia   . Depression   . Diverticulosis   . Fibromyalgia   . Gastroesophageal reflux disease   . Hiatal hernia   . History of benign esophageal tumor 2010   granular cell esophageal tumor (Dx 06/2008), resected via EMR 2011, due repeat EGD 02/2012  . Hyperlipidemia   . Hypertension   . Insomnia   . Migraines   . PONV (postoperative nausea and vomiting)   . Psychosis ([place])   . Sleep apnea    Stop Bang score of 5. Pt said she was told by Dr. Merlene Laughter that she had "a little bit" of sleep apena, but not bad enough to treat.  . Thyroid nodule   . Tumor of esophagus     Past Surgical History:  Procedure Laterality Date  . ABDOMINAL HYSTERECTOMY    . BRAVO [place] STUDY  12/11/2011   Procedure: BRAVO [place];  Surgeon: Daneil Dolin, MD;  Location: [facility];  Service: Endoscopy;  Laterality: N/A;  . 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 CHOLECYSTECTOMY N/A 04/09/2013   Procedure: LAPAROSCOPIC CHOLECYSTECTOMY;  Surgeon: Jamesetta So, MD;  Location: [facility] ORS;  Service: General;  Laterality: N/A;  . COLONOSCOPY  06/2008, 06/2011   sigmoid tics, tubular adenoma; 2013: anal canal hemorrhoids  . COLONOSCOPY  07/10/2011   Anal canal  hemorrhoids likely the cause of hematochezia in the setting of constipation; otherwise normal rectum ;submucosal  petechiae in left colon of doubtful clinical significance; otherwise, normal colon  . COLONOSCOPY WITH PROPOFOL N/A 11/10/2017   cancelled in pre-op  . COLONOSCOPY WITH PROPOFOL N/A 07/23/2018   Procedure: COLONOSCOPY WITH PROPOFOL;  Surgeon: Daneil Dolin, MD; 1 tubular adenoma, diverticulosis in the sigmoid and descending colon, nonbleeding internal hemorrhoids.  No recommendations to repeat due to age.  . ESOPHAGEAL DILATION N/A 05/08/2015   Procedure: ESOPHAGEAL DILATION;  Surgeon: Daneil Dolin, MD;  Location: [facility] ORS;  Service: Endoscopy;  Laterality: N/AVenia Minks 54/56  . ESOPHAGOGASTRODUODENOSCOPY  12/11/2011   [id number] Schatzki's ring; otherwise normal/Small hiatal hernia. Antral and bulbar erosions  . ESOPHAGOGASTRODUODENOSCOPY (EGD) WITH PROPOFOL N/A 05/08/2015   Dr. Gala Romney: mild erosive reflux esophagitis, non-critical Schatzki's ring s/p dilation. Hiatal hernia.   . EUS  08/2010   Dr [facility] with EGD. Retained food. No recurrent esophageal lesion, bx negative.  Marland Kitchen LUNG BIOPSY     negative  . PARATHYROIDECTOMY  April 2017   [facility].   Marland Kitchen PARATHYROIDECTOMY    . POLYPECTOMY  07/23/2018   Procedure: POLYPECTOMY;  Surgeon: Daneil Dolin, MD;  Location: [facility];  Service: Endoscopy;;  colon  . RIGHT OOPHORECTOMY  benign disease  . SHOULDER ARTHROSCOPY     Right; bone spurs removed  . TONSILLECTOMY    . TOTAL KNEE ARTHROPLASTY  2002   Right; previous arthroscopic surgery    Prior to Admission medications   Medication Sig Start Date End Date Taking? Authorizing Provider  albuterol (PROVENTIL HFA;VENTOLIN HFA) 108 (90 BASE) MCG/ACT inhaler Inhale 2 puffs into the lungs every 6 (six) hours as needed for wheezing or shortness of breath.    Yes [provider]  amLODipine (NORVASC) 10 MG tablet Take 10 mg by mouth daily.  12/18/15  Yes [provider]  atenolol (TENORMIN) 100 MG tablet Take 100 mg by mouth daily.   Yes [provider]  CALCIUM-VITAMIN D PO Take 1 tablet by mouth daily.   Yes [provider]  chlorthalidone (HYGROTON) 25 MG tablet Take 0.5 tablets (12.5 mg total) by mouth daily. Patient taking differently: Take 25 mg by mouth daily.  03/05/19 09/28/19 Yes Branch, Alphonse Guild, MD  cycloSPORINE (RESTASIS) 0.05 % ophthalmic emulsion Place 1 drop into both eyes 2 (two) times daily.   Yes [provider]  fluticasone (FLONASE) 50 MCG/ACT nasal spray Place 1 spray into both nostrils daily as needed for allergies.    Yes [provider]  furosemide (LASIX) 40 MG tablet Take 40 mg daily as needed for swelling. Patient taking differently: Take 40 mg by mouth daily as needed for edema.  06/01/19  Yes BranchAlphonse Guild, MD  gabapentin (NEURONTIN) 300 MG capsule Take 300 mg by mouth at bedtime.    Yes [provider]  lovastatin (MEVACOR) 40 MG tablet Take 40 mg by mouth at bedtime.   Yes [provider]  Melatonin 3 MG TABS Take 9 mg by mouth at bedtime.    Yes [provider]  memantine (NAMENDA) 10 MG tablet Take 10 mg by mouth 2 (two) times daily.  10/20/17  Yes [provider]  metFORMIN (GLUCOPHAGE-XR) 500 MG 24 hr tablet Take 1,000 mg by mouth 2 (two) times daily.    Yes [provider]  neomycin-bacitracin-polymyxin (NEOSPORIN) [phone] ointment Apply 1 application topically daily.   Yes [provider]  OLANZapine (ZYPREXA) 7.5 MG tablet Take 7.5 mg by mouth at bedtime.   Yes [provider]  potassium chloride SA (KLOR-CON M20) 20 MEQ tablet Take 40 meq in the AM & 20 meq in the PM. 10/13/19  Yes Branch, Alphonse Guild, MD  RABEprazole (ACIPHEX) 20 MG tablet Take 1 tablet (20 mg total) by mouth in the morning and at bedtime. 09/10/19  Yes Erenest Rasher, PA-C  spironolactone (ALDACTONE) 25 MG tablet Take 25 mg by mouth daily.  12/18/15  Yes [provider]  sucralfate (CARAFATE) 1 GM/10ML suspension Take 10 mLs (1 g total) by mouth 4 (four) times daily -  with meals and at bedtime. 10/13/19  Yes Carlis Stable, NP  topiramate (TOPAMAX) 100 MG tablet Take 100 mg by mouth at bedtime.    Yes [provider]  traZODone (DESYREL) 100 MG tablet Take 300 mg by mouth at bedtime.  07/31/17  Yes [provider]  venlafaxine XR (EFFEXOR-XR) 150 MG 24 hr capsule Take 150 mg by mouth at bedtime.    Yes [provider]  venlafaxine XR (EFFEXOR-XR) 75 MG 24 hr capsule Take 75 mg by mouth at bedtime.    Yes [provider]  HYDROcodone-acetaminophen (NORCO/VICODIN) 5-325 MG tablet Take 1 tablet by mouth every 6 (six) hours  as needed for moderate pain. Patient not taking: Reported on 09/28/2019 12/30/18   Carole Civil, MD  naloxegol oxalate (MOVANTIK) 25 MG TABS tablet Take 1 tablet (25 mg total) by mouth daily. Take 1 hour before breakfast. Patient not taking: Reported on 09/28/2019 09/10/19   Erenest Rasher, PA-C  Plecanatide (TRULANCE) 3 MG TABS Take 3 mg by mouth daily. Patient not taking: Reported on 09/28/2019 09/22/19   Erenest Rasher, PA-C    Allergies as of 09/10/2019 - Review Complete 09/10/2019  Allergen Reaction Noted  . Elavil [amitriptyline] Other (See Comments) 06/03/2012  . Abilify [aripiprazole] Other (See Comments) 11/25/2012  . Codeine Hives, Nausea Only, and Other (See Comments)   . Latex Hives 11/21/2011  . Penicillins Hives, Itching, and Other (See Comments)   . Polyethylene glycol Other (See Comments) 12/26/2012  . Sulfonamide derivatives Nausea And Vomiting   . Cymbalta [duloxetine hcl] Rash 03/10/2013  . Remeron [mirtazapine] Other (See Comments) 05/06/2012    Family History  Problem Relation Age of Onset  . Stroke Father   . Alcohol abuse Father   . Heart attack Mother   . Depression Mother   . Anxiety disorder Mother   . Colon cancer Paternal Aunt   .  Colon cancer Paternal Uncle   . Dementia Maternal Uncle   . ADD / ADHD Neg Hx   . Bipolar disorder Neg Hx   . Drug abuse Neg Hx   . OCD Neg Hx   . Paranoid behavior Neg Hx   . Schizophrenia Neg Hx   . Seizures Neg Hx   . Sexual abuse Neg Hx   . Physical abuse Neg Hx     Social History   Socioeconomic History  . Marital status: Single    Spouse name: Not on file  . Number of children: 1  . Years of education: Not on file  . Highest education level: Not on file  Occupational History  . Occupation: disabled    Employer: RETIRED  Tobacco Use  . Smoking status: Never Smoker  . Smokeless tobacco: Never Used  Substance and Sexual Activity  . Alcohol use: No    Alcohol/week: 0.0 standard drinks  . Drug use: No  . Sexual activity: Never  Other Topics Concern  . Not on file  Social History Narrative  . Not on file   Social Determinants of Health   Financial Resource Strain:   . Difficulty of Paying Living Expenses:   Food Insecurity:   . Worried About Charity fundraiser in the Last Year:   . Arboriculturist in the Last Year:   Transportation Needs:   . Film/video editor (Medical):   Marland Kitchen Lack of Transportation (Non-Medical):   Physical Activity:   . Days of Exercise per Week:   . Minutes of Exercise per Session:   Stress:   . Feeling of Stress :   Social Connections:   . Frequency of Communication with Friends and Family:   . Frequency of Social Gatherings with Friends and Family:   . Attends Religious Services:   . Active Member of Clubs or Organizations:   . Attends Archivist Meetings:   Marland Kitchen Marital Status:   Intimate Partner Violence:   . Fear of Current or Ex-Partner:   . Emotionally Abused:   Marland Kitchen Physically Abused:   . Sexually Abused:     Review of Systems: See HPI, otherwise negative ROS  Physical Exam: BP 140/88   Pulse 89  Temp 98 F (36.7 C) (Oral)   Resp 17   Ht 5\' 2"  (1.575 m)   Wt 81.6 kg   SpO2 98%   BMI 32.92 kg/m   General:   Alert,  Well-developed, well-nourished, pleasant and cooperative in NAD SNeck:  Supple; no masses or thyromegaly. No significant cervical adenopathy. Lungs:  Clear throughout to auscultation.   No wheezes, crackles, or rhonchi. No acute distress. Heart:  Regular rate and rhythm; no murmurs, clicks, rubs,  or gallops. Abdomen: Non-distended, normal bowel sounds.  Soft and nontender without appreciable mass or hepatosplenomegaly.  Pulses:  Normal pulses noted. Extremities:  Without clubbing or edema.  Impression/Plan: 74 year old lady with reflux symptoms somewhat refractory but better with AcipHex twice daily and Carafate.  No dysphagia.  Takes Goody powders 3 a day on a chronic basis. Diagnostic EGD today per plan.  The risks, benefits, limitations, alternatives and imponderables have been reviewed with the patient. Potential for esophageal dilation, biopsy, etc. have also been reviewed.  Questions have been answered. All parties agreeable.   Notice: This dictation was prepared with Dragon dictation along with smaller phrase technology. Any transcriptional errors that result from this process are unintentional and may not be corrected upon review.

## 2019-10-14 NOTE — Anesthesia Preprocedure Evaluation (Addendum)
Anesthesia Evaluation  Patient identified by MRN, date of birth, ID band Patient awake    Reviewed: Allergy & Precautions, H&P , NPO status , Patient's Chart, lab work & pertinent test results, reviewed documented beta blocker date and time   History of Anesthesia Complications (+) PONV and history of anesthetic complications  Airway Mallampati: II  TM Distance: >3 FB Neck ROM: full    Dental no notable dental hx.    Pulmonary asthma , sleep apnea ,    Pulmonary exam normal breath sounds clear to auscultation       Cardiovascular Exercise Tolerance: Good hypertension, negative cardio ROS   Rhythm:regular Rate:Normal     Neuro/Psych  Headaches, PSYCHIATRIC DISORDERS Anxiety Depression Schizophrenia  Neuromuscular disease    GI/Hepatic Neg liver ROS, hiatal hernia, GERD  ,  Endo/Other  negative endocrine ROS  Renal/GU negative Renal ROS  negative genitourinary   Musculoskeletal   Abdominal   Peds  Hematology negative hematology ROS (+)   Anesthesia Other Findings iStat re-check of potassium = 4.0  Reproductive/Obstetrics negative OB ROS                            Anesthesia Physical Anesthesia Plan  ASA: II  Anesthesia Plan: General   Post-op Pain Management:    Induction:   PONV Risk Score and Plan: 3 and Propofol infusion  Airway Management Planned:   Additional Equipment:   Intra-op Plan:   Post-operative Plan:   Informed Consent: I have reviewed the patients History and Physical, chart, labs and discussed the procedure including the risks, benefits and alternatives for the proposed anesthesia with the patient or authorized representative who has indicated his/her understanding and acceptance.     Dental Advisory Given  Plan Discussed with: CRNA  Anesthesia Plan Comments:         Anesthesia Quick Evaluation

## 2019-10-14 NOTE — Anesthesia Postprocedure Evaluation (Signed)
Anesthesia Post Note  Patient: [name]  Procedure(s) Performed: ESOPHAGOGASTRODUODENOSCOPY (EGD) WITH PROPOFOL (N/A ) BIOPSY  Patient location during evaluation: PACU Anesthesia Type: General Level of consciousness: awake and alert Pain management: pain level controlled Vital Signs Assessment: post-procedure vital signs reviewed and stable Respiratory status: spontaneous breathing Cardiovascular status: blood pressure returned to baseline and stable Postop Assessment: no apparent nausea or vomiting Anesthetic complications: no     Last Vitals:  Vitals:   10/14/19 0827 10/14/19 0912  BP: 140/88 (!) 97/53  Pulse: 89 83  Resp: 17 16  Temp: 36.7 C (!) (P) 36.3 C  SpO2: 98% 96%    Last Pain:  Vitals:   10/14/19 0856  TempSrc:   PainSc: 2                  Lissy Deuser

## 2019-10-14 NOTE — Transfer of Care (Signed)
Immediate Anesthesia Transfer of Care Note  Patient: [name]  Procedure(s) Performed: ESOPHAGOGASTRODUODENOSCOPY (EGD) WITH PROPOFOL (N/A ) BIOPSY  Patient Location: PACU  Anesthesia Type:General  Level of Consciousness: awake  Airway & Oxygen Therapy: Patient Spontanous Breathing  Post-op Assessment: Report given to RN  Post vital signs: Reviewed and stable  Last Vitals:  Vitals Value Taken Time  BP 97/53 10/14/19 0912  Temp    Pulse 84 10/14/19 0913  Resp 23 10/14/19 0913  SpO2 95 % 10/14/19 0913  Vitals shown include unvalidated device data.  Last Pain:  Vitals:   10/14/19 0856  TempSrc:   PainSc: 2          Complications: No apparent anesthesia complications

## 2019-10-15 ENCOUNTER — Other Ambulatory Visit: Payer: Self-pay

## 2019-10-15 ENCOUNTER — Encounter: Payer: Self-pay | Admitting: Internal Medicine

## 2019-10-15 LAB — POCT I-STAT, CHEM 8
BUN: 9 mg/dL (ref 8–23)
Calcium, Ion: 0.86 mmol/L — CL (ref 1.15–1.40)
Chloride: 108 mmol/L (ref 98–111)
Creatinine, Ser: 0.8 mg/dL (ref 0.44–1.00)
Glucose, Bld: 84 mg/dL (ref 70–99)
HCT: 36 % (ref 36.0–46.0)
Hemoglobin: 12.2 g/dL (ref 12.0–15.0)
Potassium: 4 mmol/L (ref 3.5–5.1)
Sodium: 136 mmol/L (ref 135–145)
TCO2: 19 mmol/L — ABNORMAL LOW (ref 22–32)

## 2019-10-15 LAB — SURGICAL PATHOLOGY

## 2019-10-20 LAB — POCT I-STAT, CHEM 8
BUN: 12 mg/dL (ref 8–23)
Calcium, Ion: 1.02 mmol/L — ABNORMAL LOW (ref 1.15–1.40)
Chloride: 106 mmol/L (ref 98–111)
Creatinine, Ser: 0.9 mg/dL (ref 0.44–1.00)
Glucose, Bld: 91 mg/dL (ref 70–99)
HCT: 41 % (ref 36.0–46.0)
Hemoglobin: 13.9 g/dL (ref 12.0–15.0)
Potassium: 7.5 mmol/L (ref 3.5–5.1)
Sodium: 136 mmol/L (ref 135–145)
TCO2: 24 mmol/L (ref 22–32)

## 2019-10-25 ENCOUNTER — Telehealth: Payer: Self-pay | Admitting: Internal Medicine

## 2019-10-25 NOTE — Telephone Encounter (Signed)
She is having diarrhea and nothing she is taking is helping. [phone]

## 2019-10-25 NOTE — Telephone Encounter (Signed)
Recommend completing stool studies including C. Diff with GDH and Toxin A/B and GI pathogen panel as this is significant change from baseline constipation.   Recommend following a bland/low fat/low fiber diet for now. Avoid all dairy products.

## 2019-10-25 NOTE — Telephone Encounter (Signed)
Spoke with pt. Pt d/c Trulance on 10/14/19 when diarrhea started. Pt is having loose stool and states her diarrhea isn't watery. Pt is having a BM q2 hours per pt. Pt doesn't report abdominal pain. Pt isn't currently taking anything to help her bowels move. Pt was asked if she was taking Miralax, benefiber, Metamucil ect and pt states she isn't taking anything and doesn't know what to do with the frequent loose stool. Pt doesn't feel that it's diet related and is trying to avoid food that cause bloating, gas ect.

## 2019-10-26 NOTE — Telephone Encounter (Signed)
Spoke with patient.  She started taking Kaopectate when diarrhea started.  States diarrhea started after EGD she was having watery stool every 2 hours.  Currently having tarry like stools every couple of hours.  Stools are dark/black since starting Keopectate. Denies abdominal pain, N/V. No lightheadedness, dizziness, or feeling like she will pass out.   Advise she stop Kaopectate for now and monitor for return of watery stools.  Should loose stools return, she was advised to call our office and we would complete stool studies.  Additionally, advised to let us know if dark stools do not resolve after discontinuation of Kaopectate.  Advised to proceed to the ED if she began feeling lightheaded, dizzy, or feeling like she would pass out.  Candace Cruise

## 2019-10-26 NOTE — Telephone Encounter (Signed)
Noted  

## 2019-10-26 NOTE — Telephone Encounter (Signed)
Spoke with pt. Pt states her stool aren't loose. I was told yesterday that pts stool was loose. Pt states her stool is tar like, not formed. I'm not sure if stool studies can be collected. Stool is dark brown and she feels it's due to the Kaopectate pts been taking.

## 2019-11-02 ENCOUNTER — Other Ambulatory Visit: Payer: Self-pay | Admitting: Cardiology

## 2019-11-04 DIAGNOSIS — G43701 Chronic migraine without aura, not intractable, with status migrainosus: Secondary | ICD-10-CM | POA: Insufficient documentation

## 2019-11-04 DIAGNOSIS — F09 Unspecified mental disorder due to known physiological condition: Secondary | ICD-10-CM | POA: Insufficient documentation

## 2019-11-17 ENCOUNTER — Telehealth: Payer: Self-pay | Admitting: *Deleted

## 2019-11-17 NOTE — Progress Notes (Signed)
Primary Care Physician:  Andrey Campanile, MD  Primary GI: Dr. Gala Romney  Patient Location: Home   Provider Location: [facility] [place] office   Reason for Visit:  Follow-up GERD, constipation, and new concerns of bloating.   Persons present on the virtual encounter, with roles:  Aliene Altes, PA-C (provider), [name] (patient)   Total time (minutes) spent on medical discussion: 25 minutes  Virtual Visit via Telephone Note Due to COVID-19, visit is conducted virtually and was requested by patient.   I connected with [name] on 11/21/19 at 10:30 AM EDT by telephone and verified that I am speaking with the correct person using two identifiers.   I discussed the limitations, risks, security and privacy concerns of performing an evaluation and management service by telephone and the availability of in person appointments. I also discussed with the patient that there may be a patient responsible charge related to this service. The patient expressed understanding and agreed to proceed.  Chief Complaint  Patient presents with  . Follow-up    fu from EGD, stomach pain    History of Present Illness: [name] is a 74 y.o. female history of GERD, esophageal granular cell tumor s/p EMR in 2011 by Dr. Newman Pies with [facility] Has also seen Dr. Newman Pies for uncontrolled GERD in 2014 s/p EGDs, normal GES, and a variety of PPIs. Manometry had been attempted but she was unable to tolerate the probe. Also with chronic constipation likely opioid induced, and adenomatous colon polyps with last colonoscopy in January 2020 and no recommendations to repeat due to age.   She presents today for follow-up s/p EGD for uncontrolled GERD/odynphagia. Also following up on chronic constipation and with concerns about bloating.   Last seen in our office on 09/10/19.  Reported 2 months of worsening reflux symptoms with burning in her esophagus.  Associated odynophagia and intermittent epigastric pain.  Not following GERD  diet and taking Goody powders daily for knee pain and headaches.  Multiple PPI failures (Dexilant, Nexium, omeprazole, Prevacid, and Protonix) on once daily dosing.  Plan to increase AcipHex to twice daily, add Carafate, and proceed with EGD. Advised to stop Goody powders, avoid all NSAIDs, and discussed pain management with PCP.  Regarding constipation, she had failed Linzess and Amitiza historically.  Had been on Movantik 25 mg daily which produced a BM about once a week but had also run out of this and was having a BM every 2-3 weeks.  No alarm symptoms.  Advised to resume Movantik and call with progress report.  Movantik was not covered under patient's insurance.  PA was denied.  Trulance was sent to pharmacy.  PA completed and approved.  EGD 10/14/2019: Nonobstructing Schatzki ring, small hiatal hernia, abnormal gastric mucosa s/p biopsied, normal first and second portion of the duodenum.  Suspected daily Goody powders likely explain refractory upper GI symptoms.  Advised to continue current PPI BID and carafate, stop Goody powders, and see PCP for evaluation of daily headaches.  Pathology with chronic inactive gastritis, no H. pylori.  Telephone note 10/25/2019 with patient reporting new onset of diarrhea after EGD.  BMs every couple of hours.  Stools were dark/black and tarry rather than loose since starting Kaopectate for diarrhea.  Denied abdominal pain, nausea, vomiting.  Advised she stop Kaopectate and monitor for return of watery stools.  Should loose stools return, she is advised to call our office and we would complete stool studies.  Additionally, advise she let us know if dark stools do  not resolve after discontinuation of Kaopectate.  No return phone call received.  Today: Overall feels improved. Carafate has helped with indigestion. No breakthrough GERD symptoms. No esophageal burning. Some abdominal discomfort below her sternum. Daily. Comes and goes.  More of a bloated sensation. Has a lot  of gas. Improves with burping/passing gas. Taking a gas pill as needed. Helps some. No worsening with meals. No association with bowels. No association with certain movements. Last a couple minutes to all day.  Can't remember when the pain started but was present prior to EGD. No worsening. No nausea or vomiting. No nocturnal symptoms. No pain when pressing on her abdomen. Appetite is good. No unintentional weight loss. Weight fluctuates within a couple of pounds.     Taking Aciphex BID. Carafate 4 times a day. No Goody's since EGD. No other NSAIDs. Taking norco daily for Migraines. Reports Dr. Merlene Laughter is following her. Still having headaches daily.   BMs daily. Not taking trulance. Stools soft. Only 1 BM a day. No blood in the stool. No black stool.   No alcohol. Some fried foods daily. Fried chicken every other day. Drinking soda  a few times a week. Some beans. Drinks milk. No cheese. Eating salads twice a week.   Past Medical History:  Diagnosis Date  . Adenomatous colon polyp 2006   excised in 2006 & 2010Due surveillance 06/2013  . Anxiety   . Anxiety and depression   . Asthma   . Breast mass    right nipple bengn mass per patient  . Breast tumor   . Chest discomfort   . Chronic back pain   . Chronic constipation   . Complication of anesthesia   . Depression   . Diverticulosis   . Fibromyalgia   . Gastroesophageal reflux disease   . Hiatal hernia   . History of benign esophageal tumor 2010   granular cell esophageal tumor (Dx 06/2008), resected via EMR 2011, due repeat EGD 02/2012  . Hyperlipidemia   . Hypertension   . Insomnia   . Migraines   . PONV (postoperative nausea and vomiting)   . Psychosis ([place])   . Sleep apnea    Stop Bang score of 5. Pt said she was told by Dr. Merlene Laughter that she had "a little bit" of sleep apena, but not bad enough to treat.  . Thyroid nodule   . Tumor of esophagus      Past Surgical History:  Procedure Laterality Date  . ABDOMINAL  HYSTERECTOMY    . BIOPSY  10/14/2019   Procedure: BIOPSY;  Surgeon: Daneil Dolin, MD;  Location: [facility];  Service: Endoscopy;;  . BRAVO [place] STUDY  12/11/2011   Procedure: BRAVO [place];  Surgeon: Daneil Dolin, MD;  Location: [facility];  Service: Endoscopy;  Laterality: N/A;  . 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 CHOLECYSTECTOMY N/A 04/09/2013   Procedure: LAPAROSCOPIC CHOLECYSTECTOMY;  Surgeon: Jamesetta So, MD;  Location: [facility] ORS;  Service: General;  Laterality: N/A;  . COLONOSCOPY  06/2008, 06/2011   sigmoid tics, tubular adenoma; 2013: anal canal hemorrhoids  . COLONOSCOPY  07/10/2011   Anal canal hemorrhoids likely the cause of hematochezia in the setting of constipation; otherwise normal rectum ;submucosal  petechiae in left colon of doubtful clinical significance; otherwise, normal colon  . COLONOSCOPY WITH PROPOFOL N/A 11/10/2017   cancelled in pre-op  . COLONOSCOPY WITH PROPOFOL N/A 07/23/2018   Procedure: COLONOSCOPY WITH  PROPOFOL;  Surgeon: Daneil Dolin, MD; 1 tubular adenoma, diverticulosis in the sigmoid and descending colon, nonbleeding internal hemorrhoids.  No recommendations to repeat due to age.  . ESOPHAGEAL DILATION N/A 05/08/2015   Procedure: ESOPHAGEAL DILATION;  Surgeon: Daneil Dolin, MD;  Location: [facility] OR[facility];  Service: Endoscopy;  Laterality: N/AVenia Minks 54/56  . ESOPHAGOGASTRODUODENOSCOPY  12/11/2011   [id number] Schatzki's ring; otherwise normal/Small hiatal hernia. Antral and bulbar erosions  . ESOPHAGOGASTRODUODENOSCOPY (EGD) WITH PROPOFOL N/A 05/08/2015   Dr. Gala Romney: mild erosive reflux esophagitis, non-critical Schatzki's ring s/p dilation. Hiatal hernia.   Marland Kitchen ESOPHAGOGASTRODUODENOSCOPY (EGD) WITH PROPOFOL N/A 10/14/2019   Procedure: ESOPHAGOGASTRODUODENOSCOPY (EGD) WITH PROPOFOL;  Surgeon: Daneil Dolin, MD;  Nonobstructing Schatzki ring, small hiatal hernia, abnormal gastric mucosa s/p biopsied,  normal first and second portion of the duodenum.  Pathology with chronic inactive gastritis, no H. pylori.  . EUS  08/2010   Dr [facility] with EGD. Retained food. No recurrent esophageal lesion, bx negative.  Marland Kitchen LUNG BIOPSY     negative  . PARATHYROIDECTOMY  April 2017   [facility].   Marland Kitchen PARATHYROIDECTOMY    . POLYPECTOMY  07/23/2018   Procedure: POLYPECTOMY;  Surgeon: Daneil Dolin, MD;  Location: [facility];  Service: Endoscopy;;  colon  . RIGHT OOPHORECTOMY     benign disease  . SHOULDER ARTHROSCOPY     Right; bone spurs removed  . TONSILLECTOMY    . TOTAL KNEE ARTHROPLASTY  2002   Right; previous arthroscopic surgery     Current Meds  Medication Sig  . albuterol (PROVENTIL HFA;VENTOLIN HFA) 108 (90 BASE) MCG/ACT inhaler Inhale 2 puffs into the lungs every 6 (six) hours as needed for wheezing or shortness of breath.   Marland Kitchen amLODipine (NORVASC) 10 MG tablet Take 10 mg by mouth daily.   Marland Kitchen atenolol (TENORMIN) 100 MG tablet Take 100 mg by mouth daily.  Marland Kitchen CALCIUM-VITAMIN D PO Take 1 tablet by mouth daily.  . chlorthalidone (HYGROTON) 25 MG tablet Take 1/2 tablet by mouth daily.  . cycloSPORINE (RESTASIS) 0.05 % ophthalmic emulsion Place 1 drop into both eyes 2 (two) times daily.  Eduard Roux (AIMOVIG [place]) Inject into the skin.  . fluticasone (FLONASE) 50 MCG/ACT nasal spray Place 1 spray into both nostrils daily as needed for allergies.   . furosemide (LASIX) 40 MG tablet Take 40 mg daily as needed for swelling. (Patient taking differently: Take 40 mg by mouth daily as needed for edema. )  . gabapentin (NEURONTIN) 300 MG capsule Take 300 mg by mouth at bedtime.   Marland Kitchen HYDROcodone-acetaminophen (NORCO/VICODIN) 5-325 MG tablet Take 1 tablet by mouth daily.  Marland Kitchen lovastatin (MEVACOR) 40 MG tablet Take 40 mg by mouth at bedtime.  . Melatonin 3 MG TABS Take 9 mg by mouth at bedtime.   . memantine (NAMENDA) 10 MG tablet Take 10 mg by mouth 2 (two) times daily.   . metFORMIN (GLUCOPHAGE-XR) 500 MG  24 hr tablet Take 1,000 mg by mouth 2 (two) times daily.   Marland Kitchen neomycin-bacitracin-polymyxin (NEOSPORIN) [phone] ointment Apply 1 application topically as needed.   Marland Kitchen OLANZapine (ZYPREXA) 7.5 MG tablet Take 7.5 mg by mouth at bedtime.  . potassium chloride SA (KLOR-CON M20) 20 MEQ tablet Take 40 meq in the AM & 20 meq in the PM. (Patient taking differently: 20 mEq daily. Takes one tablet (20 MEQ) daily.)  . RABEprazole (ACIPHEX) 20 MG tablet Take 1 tablet (20 mg total) by mouth in the morning and at bedtime.  Marland Kitchen  spironolactone (ALDACTONE) 25 MG tablet Take 25 mg by mouth daily.  . sucralfate (CARAFATE) 1 GM/10ML suspension Take 10 mLs (1 g total) by mouth 4 (four) times daily -  with meals and at bedtime.  . topiramate (TOPAMAX) 100 MG tablet Take 100 mg by mouth at bedtime.   . traZODone (DESYREL) 100 MG tablet Take 300 mg by mouth at bedtime.   Marland Kitchen venlafaxine XR (EFFEXOR-XR) 150 MG 24 hr capsule Take 150 mg by mouth at bedtime.   Marland Kitchen venlafaxine XR (EFFEXOR-XR) 75 MG 24 hr capsule Take 75 mg by mouth at bedtime.   . [DISCONTINUED] sucralfate (CARAFATE) 1 GM/10ML suspension Take 10 mLs (1 g total) by mouth 4 (four) times daily -  with meals and at bedtime.     Family History  Problem Relation Age of Onset  . Stroke Father   . Alcohol abuse Father   . Heart attack Mother   . Depression Mother   . Anxiety disorder Mother   . Colon cancer Paternal Aunt   . Colon cancer Paternal Uncle   . Dementia Maternal Uncle   . ADD / ADHD Neg Hx   . Bipolar disorder Neg Hx   . Drug abuse Neg Hx   . OCD Neg Hx   . Paranoid behavior Neg Hx   . Schizophrenia Neg Hx   . Seizures Neg Hx   . Sexual abuse Neg Hx   . Physical abuse Neg Hx     Social History   Socioeconomic History  . Marital status: Single    Spouse name: Not on file  . Number of children: 1  . Years of education: Not on file  . Highest education level: Not on file  Occupational History  . Occupation: disabled    Employer: RETIRED   Tobacco Use  . Smoking status: Never Smoker  . Smokeless tobacco: Never Used  Substance and Sexual Activity  . Alcohol use: No    Alcohol/week: 0.0 standard drinks  . Drug use: No  . Sexual activity: Never  Other Topics Concern  . Not on file  Social History Narrative  . Not on file   Social Determinants of Health   Financial Resource Strain:   . Difficulty of Paying Living Expenses:   Food Insecurity:   . Worried About Charity fundraiser in the Last Year:   . Arboriculturist in the Last Year:   Transportation Needs:   . Film/video editor (Medical):   Marland Kitchen Lack of Transportation (Non-Medical):   Physical Activity:   . Days of Exercise per Week:   . Minutes of Exercise per Session:   Stress:   . Feeling of Stress :   Social Connections:   . Frequency of Communication with Friends and Family:   . Frequency of Social Gatherings with Friends and Family:   . Attends Religious Services:   . Active Member of Clubs or Organizations:   . Attends Archivist Meetings:   Marland Kitchen Marital Status:     Review of Systems: Gen: Denies fever, chills, lightheadedness, dizziness, presyncope, syncope CV: Denies chest pain.  Reports increased heart rate at times.  States PCP is aware and is following. Resp: Denies dyspnea at rest. Chronic cough GI: see HPI Derm: Denies rash Heme: See HPI  Observations/Objective: No distress. Pleasant. Unable to perform complete physical exam due to telephone encounter. No video available.   Assessment and Plan: 74 year old female with chronic history of GERD with multiple PPI failures  in the past, chronic constipation, and adenomatous colon polyps with last colonoscopy in January 2020 and no recommendations to repeat due to age.  She presents today for follow-up of chronic GI conditions s/p EGD for uncontrolled GERD/odynophagia.  Also reports fairly new upper abdominal bloating.  GERD: Much improved/well-controlled after increasing AcipHex to  twice daily and adding Carafate 4 times daily.  EGD 10/14/2019 with nonobstructing Schatzki ring, small hiatal hernia, abnormal gastric mucosa with pathology revealing chronic inactive gastritis, no H. Pylori. Notably she had also been taking Goody powders daily and has also discontinued this since EGD.   Continue Aciphex 20 mg BID 30 minutes before breakfast and dinner. Continue Carafate.  May try weaning off of this to see if Aciphex alone controls GERD.  Continue to avoid all NSAIDs.  Constipation: Symptoms see to be well controlled at this time without any medications. Notably, she did have diarrhea after her EGD but this has resolved. Currently with 1 soft, formed BM daily. No abdominal pain, brbpr, or melena. Advised to continue to monitor for return of constipation.   Bloating: Reports fairly new onset of daily, intermittent upper abdominal bloating/pressure.  Improves with burping/passing gas.  Also notes gas pills help. Can't tell me when this started but notes it was present prior to EGD. EGD 10/14/2019 with nonobstructing Schatzki's ring, small hiatal hernia, chronic inactive gastritis without H. pylori.  She had been taking Goody powders daily but has discontinued this.  GERD symptoms are well controlled. Denies nausea, vomiting, nocturnal symptoms, decrease in appetite, weight loss, or pain when pressing on her abdomen. Suspect dietary habits are likely playing a role.  Query whether she has some delayed stomach emptying influenced by addition of Norco recently by her Neurologist for chronic headaches.   Advised to add a daily probiotic. Try taking simethicone daily. Follow lactose-free diet or take Lactaid tablets prior to dairy consumption. Follow a low-fat diet avoiding fried/fatty/greasy foods.  All meats should be lean (poultry or fish) and baked, boiled, or broiled. Avoid items that cause gas and bloating including cabbage, cauliflower, broccoli, beans, Brussels sprouts, carbonated  beverages, artificial sweeteners, and drinking through a straw. Advise she continue to monitor her symptoms and let me know of any worsening.  Could consider repeat GES, celiac serologies, or evaluation for SIBO in the future if symptoms persist. Follow-up in 4 months.   Follow Up Instructions: Follow-up in 4 months.  Call with questions or concerns prior.   I discussed the assessment and treatment plan with the patient. The patient was provided an opportunity to ask questions and all were answered. The patient agreed with the plan and demonstrated an understanding of the instructions.   The patient was advised to call back or seek an in-person evaluation if the symptoms worsen or if the condition fails to improve as anticipated.  I provided 25 minutes of non-face-to-face time during this encounter.  Aliene Altes, PA-C [facility]

## 2019-11-17 NOTE — Telephone Encounter (Signed)
Pt consented to telephone visit. 

## 2019-11-17 NOTE — Telephone Encounter (Signed)
[name], you are scheduled for a virtual visit with your provider today.  Just as we do with appointments in the office, we must obtain your consent to participate.  Your consent will be active for this visit and any virtual visit you may have with one of our providers in the next 365 days.  If you have a MyChart account, I can also send a copy of this consent to you electronically.  All virtual visits are billed to your insurance company just like a traditional visit in the office.  As this is a virtual visit, video technology does not allow for your provider to perform a traditional examination.  This may limit your provider's ability to fully assess your condition.  If your provider identifies any concerns that need to be evaluated in person or the need to arrange testing such as labs, EKG, etc, we will make arrangements to do so.  Although advances in technology are sophisticated, we cannot ensure that it will always work on either your end or our end.  If the connection with a video visit is poor, we may have to switch to a telephone visit.  With either a video or telephone visit, we are not always able to ensure that we have a secure connection.   I need to obtain your verbal consent now.   Are you willing to proceed with your visit today?  °

## 2019-11-18 ENCOUNTER — Ambulatory Visit (INDEPENDENT_AMBULATORY_CARE_PROVIDER_SITE_OTHER): Payer: Medicare Other | Admitting: Gastroenterology

## 2019-11-18 ENCOUNTER — Encounter: Payer: Self-pay | Admitting: Gastroenterology

## 2019-11-18 ENCOUNTER — Other Ambulatory Visit: Payer: Self-pay

## 2019-11-18 ENCOUNTER — Encounter: Payer: Self-pay | Admitting: Internal Medicine

## 2019-11-18 DIAGNOSIS — K5909 Other constipation: Secondary | ICD-10-CM | POA: Diagnosis not present

## 2019-11-18 DIAGNOSIS — K219 Gastro-esophageal reflux disease without esophagitis: Secondary | ICD-10-CM | POA: Diagnosis not present

## 2019-11-18 DIAGNOSIS — R1310 Dysphagia, unspecified: Secondary | ICD-10-CM | POA: Diagnosis not present

## 2019-11-18 DIAGNOSIS — R140 Abdominal distension (gaseous): Secondary | ICD-10-CM | POA: Diagnosis not present

## 2019-11-18 MED ORDER — SUCRALFATE 1 GM/10ML PO SUSP: 1.0000 g | Freq: Three times a day (TID) | ORAL | 1 refills | Status: DC

## 2019-11-18 NOTE — Patient Instructions (Addendum)
Continue Aciphex 20 mg 30 minutes before breakfast and dinner.   Continue Carafate 3 times daily before meals and at bedtime. You can try to slowly decrease this over the next several weeks to see if your reflux continues to stay well controlled. Take 3 times daily x 1 week, 2 times daily x 1 week, daily x 1 week, every other day, then stop.   Continue avoiding Goody powders and avoid all other NSAIDs including ibuprofen, Aleve, Advil, and Goody powders.  For bloating:  Add a daily probiotic.  Align, digestive advantage, and [facility] colon health are good options. Try taking simethicone daily. Follow a low fat diet. Avoid fried/fatty/greasy foods.  All meats should be lean (poultry or fish) and baked, boiled, or broiled. Follow a lactose-free diet or take Lactaid tablets prior to any dairy consumption. Avoid items that cause gas and bloating including cabbage, cauliflower, broccoli, beans, Brussels sprouts, carbonated beverages, artificial sweeteners, drinking through a straw.  Continue to monitor your symptoms and let me know of any worsening abdominal discomfort.  We will plan to see you back in 4 months.  Call with questions or concerns prior.  Donna Altes, PA-C [facility]    Abdominal Bloating When you have abdominal bloating, your abdomen may feel full, tight, or painful. It may also look bigger than normal or swollen (distended). Common causes of abdominal bloating include:  Swallowing air.  Constipation.  Problems digesting food.  Eating too much.  Irritable bowel syndrome. This is a condition that affects the large intestine.  Lactose intolerance. This is an inability to digest lactose, a natural sugar in dairy products.  Celiac disease. This is a condition that affects the ability to digest gluten, a protein found in some grains.  Gastroparesis. This is a condition that slows down the movement of food in the stomach and small intestine. It is more  common in people with diabetes mellitus.  Gastroesophageal reflux disease (GERD). This is a digestive condition that makes stomach acid flow back into the esophagus.  Urinary retention. This means that the body is holding onto urine, and the bladder cannot be emptied all the way. Follow these instructions at home: Eating and drinking  Avoid eating too much.  Try not to swallow air while talking or eating.  Avoid eating while lying down.  Avoid these foods and drinks: ? Foods that cause gas, such as broccoli, cabbage, cauliflower, and baked beans. ? Carbonated drinks. ? Hard candy. ? Chewing gum. Medicines  Take over-the-counter and prescription medicines only as told by your health care provider.  Take probiotic medicines. These medicines contain live bacteria or yeasts that can help digestion.  Take coated peppermint oil capsules. Activity  Try to exercise regularly. Exercise may help to relieve bloating that is caused by gas and relieve constipation. General instructions  Keep all follow-up visits as told by your health care provider. This is important. Contact a health care provider if:  You have nausea and vomiting.  You have diarrhea.  You have abdominal pain.  You have unusual weight loss or weight gain.  You have severe pain, and medicines do not help. Get help right away if:  You have severe chest pain.  You have trouble breathing.  You have shortness of breath.  You have trouble urinating.  You have darker urine than normal.  You have blood in your stools or have dark, tarry stools. Summary  Abdominal bloating means that the abdomen is swollen.  Common causes of abdominal bloating  are swallowing air, constipation, and problems digesting food.  Avoid eating too much and avoid swallowing air.  Avoid foods that cause gas, carbonated drinks, hard candy, and chewing gum. This information is not intended to replace advice given to you by your health  care provider. Make sure you discuss any questions you have with your health care provider. Document Revised: 09/28/2018 Document Reviewed: 07/12/2016 Elsevier Patient Education  [place].  Lactose-Free Diet, Adult If you have lactose intolerance, you are not able to digest lactose. Lactose is a natural sugar found mainly in dairy milk and dairy products. You may need to avoid all foods and beverages that contain lactose. A lactose-free diet can help you do this. Which foods have lactose? Lactose is found in dairy milk and dairy products, such as:  Yogurt.  Cheese.  Butter.  Margarine.  Sour cream.  Cream.  Whipped toppings and nondairy creamers.  Ice cream and other dairy-based desserts. Lactose is also found in foods or products made with dairy milk or milk ingredients. To find out whether a food contains dairy milk or a milk ingredient, look at the ingredients list. Avoid foods with the statement "May contain milk" and foods that contain:  Milk powder.  Whey.  Curd.  Caseinate.  Lactose.  Lactalbumin.  Lactoglobulin. What are alternatives to dairy milk and foods made with milk products?  Lactose-free milk.  Soy milk with added calcium and vitamin D.  Almond milk, coconut milk, rice milk, or other nondairy milk alternatives with added calcium and vitamin D. Note that these are low in protein.  Soy products, such as soy yogurt, soy cheese, soy ice cream, and soy-based sour cream.  Other nut milk products, such as almond yogurt, almond cheese, cashew yogurt, cashew cheese, cashew ice cream, coconut yogurt, and coconut ice cream. What are tips for following this plan?  Do not consume foods, beverages, vitamins, minerals, or medicines containing lactose. Read ingredient lists carefully.  Look for the words "lactose-free" on labels.  Use lactase enzyme drops or tablets as directed by your health care provider.  Use lactose-free milk or a milk  alternative, such as soy milk or almond milk, for drinking and cooking.  Make sure you get enough calcium and vitamin D in your diet. A lactose-free eating plan can be lacking in these important nutrients.  Take calcium and vitamin D supplements as directed by your health care provider. Talk to your health care provider about supplements if you are not able to get enough calcium and vitamin D from food. What foods can I eat?  Fruits All fresh, canned, frozen, or dried fruits that are not processed with lactose. Vegetables All fresh, frozen, and canned vegetables without cheese, cream, or butter sauces. Grains Any that are not made with dairy milk or dairy products. Meats and other proteins Any meat, fish, poultry, and other protein sources that are not made with dairy milk or dairy products. Soy cheese and yogurt. Fats and oils Any that are not made with dairy milk or dairy products. Beverages Lactose-free milk. Soy, rice, or almond milk with added calcium and vitamin D. Fruit and vegetable juices. Sweets and desserts Any that are not made with dairy milk or dairy products. Seasonings and condiments Any that are not made with dairy milk or dairy products. Calcium Calcium is found in many foods that contain lactose and is important for bone health. The amount of calcium you need depends on your age:  Adults younger than 50 years:  1,000 mg of calcium a day.  Adults older than 50 years: 1,200 mg of calcium a day. If you are not getting enough calcium, you may get it from other sources, including:  Orange juice with calcium added. There are 300-350 mg of calcium in 1 cup of orange juice.  Calcium-fortified soy milk. There are 300-400 mg of calcium in 1 cup of calcium-fortified soy milk.  Calcium-fortified rice or almond milk. There are 300 mg of calcium in 1 cup of calcium-fortified rice or almond milk.  Calcium-fortified breakfast cereals. There are 100-1,000 mg of calcium in  calcium-fortified breakfast cereals.  Spinach, cooked. There are 145 mg of calcium in  cup of cooked spinach.  Edamame, cooked. There are 130 mg of calcium in  cup of cooked edamame.  Collard greens, cooked. There are 125 mg of calcium in  cup of cooked collard greens.  Kale, frozen or cooked. There are 90 mg of calcium in  cup of cooked or frozen kale.  Almonds. There are 95 mg of calcium in  cup of almonds.  Broccoli, cooked. There are 60 mg of calcium in 1 cup of cooked broccoli. The items listed above may not be a complete list of recommended foods and beverages. Contact a dietitian for more options. What foods are not recommended? Fruits None, unless they are made with dairy milk or dairy products. Vegetables None, unless they are made with dairy milk or dairy products. Grains Any grains that are made with dairy milk or dairy products. Meats and other proteins None, unless they are made with dairy milk or dairy products. Dairy All dairy products, including milk, goat's milk, buttermilk, kefir, acidophilus milk, flavored milk, evaporated milk, condensed milk, dulce de [place], eggnog, yogurt, cheese, and cheese spreads. Fats and oils Any that are made with milk or milk products. Margarines and salad dressings that contain milk or cheese. Cream. Half and half. Cream cheese. Sour cream. Chip dips made with sour cream or yogurt. Beverages Hot chocolate. Cocoa with lactose. Instant iced teas. Powdered fruit drinks. Smoothies made with dairy milk or yogurt. Sweets and desserts Any that are made with milk or milk products. Seasonings and condiments Chewing gum that has lactose. Spice blends if they contain lactose. Artificial sweeteners that contain lactose. Nondairy creamers. The items listed above may not be a complete list of foods and beverages to avoid. Contact a dietitian for more information. Summary  If you are lactose intolerant, it means that you have a hard time  digesting lactose, a natural sugar found in milk and milk products.  Following a lactose-free diet can help you manage this condition.  Calcium is important for bone health and is found in many foods that contain lactose. Talk with your health care provider about other sources of calcium. This information is not intended to replace advice given to you by your health care provider. Make sure you discuss any questions you have with your health care provider. Document Revised: 07/08/2017 Document Reviewed: 07/08/2017 Elsevier Patient Education  2020 [employer].

## 2019-11-21 ENCOUNTER — Encounter: Payer: Self-pay | Admitting: Gastroenterology

## 2019-12-02 DIAGNOSIS — L728 Other follicular cysts of the skin and subcutaneous tissue: Secondary | ICD-10-CM | POA: Diagnosis not present

## 2019-12-02 DIAGNOSIS — L280 Lichen simplex chronicus: Secondary | ICD-10-CM | POA: Diagnosis not present

## 2019-12-02 DIAGNOSIS — L720 Epidermal cyst: Secondary | ICD-10-CM | POA: Diagnosis not present

## 2019-12-08 DIAGNOSIS — G43701 Chronic migraine without aura, not intractable, with status migrainosus: Secondary | ICD-10-CM | POA: Diagnosis not present

## 2019-12-09 NOTE — Progress Notes (Signed)
Cardiology Office Note  Date: 12/10/2019   ID: [name], DOB [birth year], MRN [id number]  PCP:  Donna Squibb, MD  Cardiologist:  Donna Dolly, MD Electrophysiologist:  None    Chief Complaint: Follow-up hypertension  History of Present Illness: [name] is a 74 y.o. female with a history of hypertension, chronic diastolic heart failure.  Last saw [name] 06/01/2019.  Her chlorthalidone 25 mg daily caused an uptrend in creatinine.  It was lowered to 12.5 mg daily.  She had some issues with some low potassium it was adjusted in September 2020.  Her home blood pressures were 140s over 80s in early a.m. and 110s over 80s after medications.  Her echo 2016 showed EF of 60 to 65%.  Diastolic dysfunction indeterminate grade.  She reported no recent edema, shortness of breath, dyspnea.  Had not used her as needed Lasix.  She was continuing her current antihypertensive medications.  She has no symptoms related to her diastolic heart failure.  She was euvolemic.  Annual labs were ordered and potassium was refilled.  She is here today for 74-month follow-up.  She states recently she is becoming progressively tired with very little exertion.  She complains of racing heart/palpitations.  She states I am wondering if I have heart failure.  She denies any recent weight gain, lower extremity edema, PND or orthopnea.  Her baseline resting heart rate appears to be in the low 100s or high 90s.  EKG today shows sinus tachycardia with possible premature atrial complexes with aberrant conduction.  Rate of 104, nonspecific T wave abnormality.  Denies any anginal symptoms.  States she has had a stress test in the past and states she cannot do a stress test.  No claudication-like symptoms, DVT or PE-like symptoms.   Past Medical History:  Diagnosis Date  . Adenomatous colon polyp 2006   excised in 2006 & 2010Due surveillance 06/2013  . Anxiety   . Anxiety and depression   . Asthma   . Breast  mass    right nipple bengn mass per patient  . Breast tumor   . Chest discomfort   . Chronic back pain   . Chronic constipation   . Complication of anesthesia   . Depression   . Diverticulosis   . Fibromyalgia   . Gastroesophageal reflux disease   . Hiatal hernia   . History of benign esophageal tumor 2010   granular cell esophageal tumor (Dx 06/2008), resected via EMR 2011, due repeat EGD 02/2012  . Hyperlipidemia   . Hypertension   . Insomnia   . Migraines   . PONV (postoperative nausea and vomiting)   . Psychosis ([name])   . Sleep apnea    Stop Bang score of 5. Pt said she was told by Dr. Merlene Houston that she had "a little bit" of sleep apena, but not bad enough to treat.  . Thyroid nodule   . Tumor of esophagus     Past Surgical History:  Procedure Laterality Date  . ABDOMINAL HYSTERECTOMY    . BIOPSY  10/14/2019   Procedure: BIOPSY;  Surgeon: Donna Dolin, MD;  Location: [facility];  Service: Endoscopy;;  . BRAVO [place] STUDY  12/11/2011   Procedure: BRAVO [place];  Surgeon: Donna Dolin, MD;  Location: [facility];  Service: Endoscopy;  Laterality: N/A;  . 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 CHOLECYSTECTOMY N/A 04/09/2013   Procedure: LAPAROSCOPIC CHOLECYSTECTOMY;  Surgeon: Donna So, MD;  Location: [facility];  Service: General;  Laterality: N/A;  . COLONOSCOPY  06/2008, 06/2011   sigmoid tics, tubular adenoma; 2013: anal canal hemorrhoids  . COLONOSCOPY  07/10/2011   Anal canal hemorrhoids likely the cause of hematochezia in the setting of constipation; otherwise normal rectum ;submucosal  petechiae in left colon of doubtful clinical significance; otherwise, normal colon  . COLONOSCOPY WITH PROPOFOL N/A 11/10/2017   cancelled in pre-op  . COLONOSCOPY WITH PROPOFOL N/A 07/23/2018   Procedure: COLONOSCOPY WITH PROPOFOL;  Surgeon: Donna Dolin, MD; 1 tubular adenoma, diverticulosis in the sigmoid and descending colon,  nonbleeding internal hemorrhoids.  No recommendations to repeat due to age.  . ESOPHAGEAL DILATION N/A 05/08/2015   Procedure: ESOPHAGEAL DILATION;  Surgeon: Donna Dolin, MD;  Location: [facility];  Service: Endoscopy;  Laterality: N/AVenia Minks 54/56  . ESOPHAGOGASTRODUODENOSCOPY  12/11/2011   [id number] Schatzki's ring; otherwise normal/Small hiatal hernia. Antral and bulbar erosions  . ESOPHAGOGASTRODUODENOSCOPY (EGD) WITH PROPOFOL N/A 05/08/2015   Dr. Gala Romney: mild erosive reflux esophagitis, non-critical Schatzki's ring s/p dilation. Hiatal hernia.   Marland Kitchen ESOPHAGOGASTRODUODENOSCOPY (EGD) WITH PROPOFOL N/A 10/14/2019   Procedure: ESOPHAGOGASTRODUODENOSCOPY (EGD) WITH PROPOFOL;  Surgeon: Donna Dolin, MD;  Nonobstructing Schatzki ring, small hiatal hernia, abnormal gastric mucosa s/p biopsied, normal first and second portion of the duodenum.  Pathology with chronic inactive gastritis, no H. pylori.  . EUS  08/2010   Dr [facility] with EGD. Retained food. No recurrent esophageal lesion, bx negative.  Marland Kitchen LUNG BIOPSY     negative  . PARATHYROIDECTOMY  April 2017   [name].   Marland Kitchen PARATHYROIDECTOMY    . POLYPECTOMY  07/23/2018   Procedure: POLYPECTOMY;  Surgeon: Donna Dolin, MD;  [facility];  Service: Endoscopy;;  colon  . RIGHT OOPHORECTOMY     benign disease  . SHOULDER ARTHROSCOPY     Right; bone spurs removed  . TONSILLECTOMY    . TOTAL KNEE ARTHROPLASTY  2002   Right; previous arthroscopic surgery    Current Outpatient Medications  Medication Sig Dispense Refill  . albuterol (PROVENTIL HFA;VENTOLIN HFA) 108 (90 BASE) MCG/ACT inhaler Inhale 2 puffs into the lungs every 6 (six) hours as needed for wheezing or shortness of breath.     Marland Kitchen amLODipine (NORVASC) 10 MG tablet Take 10 mg by mouth daily.     Marland Kitchen atenolol (TENORMIN) 100 MG tablet Take 100 mg by mouth daily.    Marland Kitchen CALCIUM-VITAMIN D PO Take 1 tablet by mouth daily.    . chlorthalidone (HYGROTON) 25 MG tablet Take 1/2  tablet by mouth daily. 45 tablet 2  . cycloSPORINE (RESTASIS) 0.05 % ophthalmic emulsion Place 1 drop into both eyes 2 (two) times daily.    Eduard Roux (AIMOVIG [place]) Inject into the skin.    . fluticasone (FLONASE) 50 MCG/ACT nasal spray Place 1 spray into both nostrils daily as needed for allergies.     . furosemide (LASIX) 40 MG tablet Take 40 mg daily as needed for swelling. 90 tablet 3  . gabapentin (NEURONTIN) 300 MG capsule Take 300 mg by mouth at bedtime.     Marland Kitchen HYDROcodone-acetaminophen (NORCO/VICODIN) 5-325 MG tablet Take 1 tablet by mouth daily.    Marland Kitchen lovastatin (MEVACOR) 40 MG tablet Take 40 mg by mouth at bedtime.    . Melatonin 3 MG TABS Take 9 mg by mouth at bedtime.     . memantine (NAMENDA) 10 MG tablet Take 10 mg by  mouth 2 (two) times daily.     . metFORMIN (GLUCOPHAGE-XR) 500 MG 24 hr tablet Take 1,000 mg by mouth 2 (two) times daily.     Marland Kitchen neomycin-bacitracin-polymyxin (NEOSPORIN) [phone] ointment Apply 1 application topically as needed.     Marland Kitchen OLANZapine (ZYPREXA) 7.5 MG tablet Take 7.5 mg by mouth at bedtime.    . potassium chloride SA (KLOR-CON M20) 20 MEQ tablet Take 40 meq in the AM & 20 meq in the PM. 270 tablet 3  . RABEprazole (ACIPHEX) 20 MG tablet Take 1 tablet (20 mg total) by mouth in the morning and at bedtime. 60 tablet 5  . spironolactone (ALDACTONE) 25 MG tablet Take 25 mg by mouth daily.    . sucralfate (CARAFATE) 1 GM/10ML suspension Take 10 mLs (1 g total) by mouth 4 (four) times daily -  with meals and at bedtime. 420 mL 1  . topiramate (TOPAMAX) 100 MG tablet Take 100 mg by mouth at bedtime.     . traZODone (DESYREL) 100 MG tablet Take 300 mg by mouth at bedtime.   3  . venlafaxine XR (EFFEXOR-XR) 150 MG 24 hr capsule Take 150 mg by mouth at bedtime.     Marland Kitchen venlafaxine XR (EFFEXOR-XR) 75 MG 24 hr capsule Take 75 mg by mouth at bedtime.     . metoprolol tartrate (LOPRESSOR) 25 MG tablet Take 0.5 tablets (12.5 mg total) by mouth 2 (two) times daily. 90  tablet 3   No current facility-administered medications for this visit.   Allergies:  Elavil [amitriptyline], Abilify [aripiprazole], Codeine, Latex, Penicillins, Polyethylene glycol, Sulfonamide derivatives, Cymbalta [duloxetine hcl], and Remeron [mirtazapine]   Social History: The patient  reports that she has never smoked. She has never used smokeless tobacco. She reports that she does not drink alcohol and does not use drugs.   Family History: The patient's family history includes Alcohol abuse in her father; Anxiety disorder in her mother; Colon cancer in her paternal aunt and paternal uncle; Dementia in her maternal uncle; Depression in her mother; Heart attack in her mother; Stroke in her father.   ROS:  Please see the history of present illness. Otherwise, complete review of systems is positive for none.  All other systems are reviewed and negative.   Physical Exam: VS:  BP 130/80   Pulse (!) 104   Ht 5\' 2"  (1.575 m)   Wt 173 lb (78.5 kg)   BMI 31.64 kg/m , BMI Body mass index is 31.64 kg/m.  Wt Readings from Last 3 Encounters:  12/10/19 173 lb (78.5 kg)  10/14/19 180 lb (81.6 kg)  10/11/19 178 lb (80.7 kg)    General: Patient appears comfortable at rest. Neck: Supple, no elevated JVP or carotid bruits, no thyromegaly. Lungs: Clear to auscultation, nonlabored breathing at rest. Cardiac: Sinus tachycardia rate and rhythm, no S3 or significant systolic murmur, no pericardial rub. Extremities: No pitting edema, distal pulses 2+. Skin: Warm and dry. Musculoskeletal: No kyphosis. Neuropsychiatric: Alert and oriented x3, affect grossly appropriate.  ECG:  An ECG dated 12/10/2019 was personally reviewed today and demonstrated:  Sinus tachycardia with possible premature atrial complexes with aberrant conduction, nonspecific T wave abnormality rate of 104  Recent Labwork: 06/01/2019: ALT 15; AST 14; Magnesium 1.8; Platelets 277; TSH 2.559 10/14/2019: BUN 9; Creatinine, Ser 0.80;  Hemoglobin 12.2; Potassium 4.0; Sodium 136     Component Value Date/Time   CHOL 147 06/01/2019 1045   TRIG 79 06/01/2019 1045   HDL 65 06/01/2019 1045  CHOLHDL 2.3 06/01/2019 1045   VLDL 16 06/01/2019 1045   LDLCALC 66 06/01/2019 1045    Other Studies Reviewed Today: Diagnostic Studies 02/17/13 Event monitor: baseline sinus brady in 50s, symptoms of dizziness correlate w/ sinus brady in 50s. Occasional PVCs, 1 PVC couplet.   01/2012 Carotid US: minimal plaque at bifurcations, no stenosis   10/2014 echo Study Conclusions  - Left ventricle: The cavity size was normal. Wall thickness was increased in a pattern of mild LVH. Systolic function was normal. The estimated ejection fraction was in the range of 60% to 65%. Abnormal diastoilc function, indeterminate grade. - Aortic valve: There was mild regurgitation. Valve area (VTI): 2.48 cm^2. Valve area (Vmax): 2.89 cm^2. - Technically adequate study.   Assessment and Plan:  1. Essential hypertension   2. Chronic diastolic heart failure ([place])   3. Tachycardia    1. Essential hypertension Patient is normotensive today with a blood pressure 130/80.  2. Chronic diastolic heart failure (HCC) History of chronic diastolic heart failure.  Last echocardiogram 2016 showed EF of 60 to 65%, abnormal diastolic function indeterminate grade.  Please repeat echocardiogram due to increasing dyspnea on exertion and activity intolerance associated with tachycardia.  Patient states "I feel like I am in heart failure".  Denies any recent weight gain in fact she has lost some weight.  No PND or orthopnea, no lower extremity edema.  3.  Resting tachycardia Patient has been having some resting tachycardia.  States her tachycardia becomes worse with activity.  States she notices sometimes that it just runs away with her even without any exertion.  He denies any associated dizziness, lightheadedness, presyncopal or syncopal episodes.  Start  metoprolol tartrate 12.5 mg p.o. twice daily.  TSH in December 2020 was 2.5.  Patient was not interested in wearing an event monitor to check for tacky or bradycardia arrhythmias stating she does not like wearing those monitors.  Medication Adjustments/Labs and Tests Ordered: Current medicines are reviewed at length with the patient today.  Concerns regarding medicines are outlined above.   Disposition: Follow-up with [name] or APP 2 months  Signed, Levell July, NP 12/10/2019 12:06 PM    [facility] [place] [place], [place], [place] [address] Phone: [phone]; Fax: [phone]

## 2019-12-10 ENCOUNTER — Ambulatory Visit (INDEPENDENT_AMBULATORY_CARE_PROVIDER_SITE_OTHER): Payer: Medicare Other | Admitting: Family Medicine

## 2019-12-10 ENCOUNTER — Other Ambulatory Visit: Payer: Self-pay

## 2019-12-10 ENCOUNTER — Encounter: Payer: Self-pay | Admitting: Family Medicine

## 2019-12-10 VITALS — BP 130/80 | HR 104 | Ht 62.0 in | Wt 173.0 lb

## 2019-12-10 DIAGNOSIS — I5032 Chronic diastolic (congestive) heart failure: Secondary | ICD-10-CM

## 2019-12-10 DIAGNOSIS — I10 Essential (primary) hypertension: Secondary | ICD-10-CM | POA: Diagnosis not present

## 2019-12-10 DIAGNOSIS — R000 Tachycardia, unspecified: Secondary | ICD-10-CM | POA: Diagnosis not present

## 2019-12-10 MED ORDER — METOPROLOL TARTRATE 25 MG PO TABS
12.5000 mg | ORAL_TABLET | Freq: Two times a day (BID) | ORAL | 3 refills | Status: DC
Start: 2019-12-10 — End: 2019-12-10

## 2019-12-10 MED ORDER — METOPROLOL TARTRATE 25 MG PO TABS: 12.5000 mg | ORAL_TABLET | Freq: Two times a day (BID) | ORAL | 3 refills | Status: DC

## 2019-12-10 NOTE — Patient Instructions (Signed)
Medication Instructions:  Your physician has recommended you make the following change in your medication:   1) Start Metoprolol 12.5mg  twice daily  *If you need a refill on your cardiac medications before your next appointment, please call your pharmacy*   Lab Work: None If you have labs (blood work) drawn today and your tests are completely normal, you will receive your results only by: Marland Kitchen MyChart Message (if you have MyChart) OR . A paper copy in the mail If you have any lab test that is abnormal or we need to change your treatment, we will call you to review the results.   Testing/Procedures: Your physician has requested that you have an echocardiogram. Echocardiography is a painless test that uses sound waves to create images of your heart. It provides your doctor with information about the size and shape of your heart and how well your heart's chambers and valves are working. This procedure takes approximately one hour. There are no restrictions for this procedure.     Follow-Up: At [facility], you and your health needs are our priority.  As part of our continuing mission to provide you with exceptional heart care, we have created designated Provider Care Teams.  These Care Teams include your primary Cardiologist (physician) and Advanced Practice Providers (APPs -  Physician Assistants and Nurse Practitioners) who all work together to provide you with the care you need, when you need it.  We recommend signing up for the patient portal called "MyChart".  Sign up information is provided on this After Visit Summary.  MyChart is used to connect with patients for Virtual Visits (Telemedicine).  Patients are able to view lab/test results, encounter notes, upcoming appointments, etc.  Non-urgent messages can be sent to your provider as well.   To learn more about what you can do with MyChart, go to [email or web address].    Your next appointment:   2 month(s)  The format for your  next appointment:   In Person  Provider:   You may see Carlyle Dolly, MD or one of the following Advanced Practice Providers on your designated Care Team:    Bernerd Pho, PA-C   Ermalinda Barrios, [place]     Other Instructions None

## 2019-12-13 ENCOUNTER — Ambulatory Visit: Payer: Medicare Other | Admitting: Cardiology

## 2019-12-13 ENCOUNTER — Other Ambulatory Visit: Payer: Self-pay | Admitting: Cardiology

## 2019-12-13 NOTE — Telephone Encounter (Signed)
[employer] would like to know if metoprolol is replacing atenolol.   Please call [phone]   Thanks renee

## 2019-12-13 NOTE — Telephone Encounter (Signed)
Number just rings, no vm set up yet per message

## 2019-12-13 NOTE — Telephone Encounter (Signed)
I spoke with Lia Hopping, informed her pt is now on metoprolol

## 2019-12-15 ENCOUNTER — Other Ambulatory Visit: Payer: Self-pay

## 2019-12-15 DIAGNOSIS — K219 Gastro-esophageal reflux disease without esophagitis: Secondary | ICD-10-CM

## 2019-12-15 DIAGNOSIS — R1310 Dysphagia, unspecified: Secondary | ICD-10-CM

## 2019-12-15 MED ORDER — RABEPRAZOLE SODIUM 20 MG PO TBEC: 20.0000 mg | DELAYED_RELEASE_TABLET | Freq: Two times a day (BID) | ORAL | 5 refills | Status: DC

## 2019-12-22 ENCOUNTER — Ambulatory Visit (HOSPITAL_COMMUNITY)
Admission: RE | Admit: 2019-12-22 | Discharge: 2019-12-22 | Disposition: A | Discharge status: Home / Self Care | Payer: Medicare Other | Source: Ambulatory Visit | Attending: Cardiology | Admitting: Cardiology

## 2019-12-22 ENCOUNTER — Other Ambulatory Visit: Payer: Self-pay

## 2019-12-22 DIAGNOSIS — I5032 Chronic diastolic (congestive) heart failure: Secondary | ICD-10-CM

## 2019-12-22 NOTE — Progress Notes (Signed)
*  PRELIMINARY RESULTS* Echocardiogram 2D Echocardiogram has been performed.  [name] 12/22/2019, 11:14 AM

## 2019-12-29 ENCOUNTER — Telehealth: Payer: Self-pay | Admitting: *Deleted

## 2019-12-29 NOTE — Telephone Encounter (Signed)
-----   Message from Arnoldo Lenis, MD sent at 12/29/2019  9:49 AM EDT ----- Echo looks fine, pumping function of the heart remains strong. Mild stiffness of the heart which is age related and unchanged   Zandra Abts MD

## 2019-12-29 NOTE — Telephone Encounter (Signed)
Pt voiced understanding - routed to pcp  

## 2020-01-01 ENCOUNTER — Other Ambulatory Visit: Payer: Self-pay | Admitting: Gastroenterology

## 2020-01-01 DIAGNOSIS — K219 Gastro-esophageal reflux disease without esophagitis: Secondary | ICD-10-CM

## 2020-01-01 DIAGNOSIS — R1310 Dysphagia, unspecified: Secondary | ICD-10-CM

## 2020-01-05 DIAGNOSIS — Z79899 Other long term (current) drug therapy: Secondary | ICD-10-CM | POA: Diagnosis not present

## 2020-01-05 DIAGNOSIS — G3184 Mild cognitive impairment, so stated: Secondary | ICD-10-CM | POA: Diagnosis not present

## 2020-01-05 DIAGNOSIS — G894 Chronic pain syndrome: Secondary | ICD-10-CM | POA: Diagnosis not present

## 2020-01-05 DIAGNOSIS — G43701 Chronic migraine without aura, not intractable, with status migrainosus: Secondary | ICD-10-CM | POA: Diagnosis not present

## 2020-01-05 DIAGNOSIS — M545 Low back pain: Secondary | ICD-10-CM | POA: Diagnosis not present

## 2020-01-11 ENCOUNTER — Other Ambulatory Visit: Payer: Self-pay | Admitting: Cardiology

## 2020-01-12 ENCOUNTER — Other Ambulatory Visit (HOSPITAL_COMMUNITY): Payer: Self-pay | Admitting: Internal Medicine

## 2020-01-12 ENCOUNTER — Telehealth: Payer: Self-pay

## 2020-01-12 DIAGNOSIS — Z1231 Encounter for screening mammogram for malignant neoplasm of breast: Secondary | ICD-10-CM

## 2020-01-12 MED ORDER — METOPROLOL TARTRATE 25 MG PO TABS: ORAL_TABLET | ORAL | 0 refills | Status: DC

## 2020-01-12 NOTE — Telephone Encounter (Signed)
refilled to [facility]

## 2020-01-12 NOTE — Telephone Encounter (Signed)
New message    Pharmacy called regarding the  metoprolol tartrate (LOPRESSOR) 25 MG tablet TAKE ONE-HALF TABLETS (12.5MG  TOTAL) BY MOUTH TWO TIMES DAILY   Can you change the prescription to a 90 day supply instead of 30 day ?

## 2020-01-26 ENCOUNTER — Encounter: Payer: Self-pay | Admitting: Orthopedic Surgery

## 2020-02-03 DIAGNOSIS — G43701 Chronic migraine without aura, not intractable, with status migrainosus: Secondary | ICD-10-CM | POA: Diagnosis not present

## 2020-02-07 ENCOUNTER — Other Ambulatory Visit: Payer: Self-pay

## 2020-02-07 ENCOUNTER — Ambulatory Visit (HOSPITAL_COMMUNITY)
Admission: RE | Admit: 2020-02-07 | Discharge: 2020-02-07 | Disposition: A | Discharge status: Home / Self Care | Payer: Medicare Other | Source: Ambulatory Visit | Attending: Internal Medicine | Admitting: Internal Medicine

## 2020-02-07 DIAGNOSIS — Z1231 Encounter for screening mammogram for malignant neoplasm of breast: Secondary | ICD-10-CM | POA: Diagnosis not present

## 2020-02-09 ENCOUNTER — Ambulatory Visit (INDEPENDENT_AMBULATORY_CARE_PROVIDER_SITE_OTHER): Payer: Medicare Other | Admitting: Student

## 2020-02-09 ENCOUNTER — Other Ambulatory Visit: Payer: Self-pay

## 2020-02-09 ENCOUNTER — Encounter: Payer: Self-pay | Admitting: Student

## 2020-02-09 VITALS — BP 128/80 | HR 90 | Ht 62.0 in | Wt 176.0 lb

## 2020-02-09 DIAGNOSIS — R002 Palpitations: Secondary | ICD-10-CM | POA: Diagnosis not present

## 2020-02-09 DIAGNOSIS — R0609 Other forms of dyspnea: Secondary | ICD-10-CM

## 2020-02-09 DIAGNOSIS — I10 Essential (primary) hypertension: Secondary | ICD-10-CM | POA: Diagnosis not present

## 2020-02-09 DIAGNOSIS — R0600 Dyspnea, unspecified: Secondary | ICD-10-CM

## 2020-02-09 DIAGNOSIS — I5032 Chronic diastolic (congestive) heart failure: Secondary | ICD-10-CM | POA: Diagnosis not present

## 2020-02-09 MED ORDER — ATENOLOL 25 MG PO TABS
ORAL_TABLET | ORAL | 3 refills | Status: DC
Start: 2020-02-09 — End: 2021-01-25

## 2020-02-09 MED ORDER — ATENOLOL 100 MG PO TABS
100.0000 mg | ORAL_TABLET | Freq: Every day | ORAL | 3 refills | Status: DC
Start: 2020-02-09 — End: 2021-01-25

## 2020-02-09 NOTE — Patient Instructions (Signed)
Medication Instructions:  STOP Metoprolol  INCREASE Atenolol to 125 mg daily ( you will take a 100 mg tablet AND a 25 mg tablet )    *If you need a refill on your cardiac medications before your next appointment, please call your pharmacy*   Lab Work: None today If you have labs (blood work) drawn today and your tests are completely normal, you will receive your results only by: Marland Kitchen MyChart Message (if you have MyChart) OR . A paper copy in the mail If you have any lab test that is abnormal or we need to change your treatment, we will call you to review the results.   Testing/Procedures: None today   Follow-Up: At [facility], you and your health needs are our priority.  As part of our continuing mission to provide you with exceptional heart care, we have created designated Provider Care Teams.  These Care Teams include your primary Cardiologist (physician) and Advanced Practice Providers (APPs -  Physician Assistants and Nurse Practitioners) who all work together to provide you with the care you need, when you need it.  We recommend signing up for the patient portal called "MyChart".  Sign up information is provided on this After Visit Summary.  MyChart is used to connect with patients for Virtual Visits (Telemedicine).  Patients are able to view lab/test results, encounter notes, upcoming appointments, etc.  Non-urgent messages can be sent to your provider as well.   To learn more about what you can do with MyChart, go to [email or web address].    Your next appointment:   6 month(s)  The format for your next appointment:   In Person  Provider:   Carlyle Dolly, MD   Other Instructions None      Thank you for choosing [facility] !

## 2020-02-09 NOTE — Progress Notes (Signed)
Cardiology Office Note    Date:  02/09/2020   ID:  LEVAEH Houston, DOB August 06, 1945, MRN [id number]  PCP:  Celene Squibb, MD  Cardiologist: Carlyle Dolly, MD    Chief Complaint  Patient presents with  . Follow-up    2 month visit    History of Present Illness:    [name] is a 74 y.o. female with past medical history of chronic diastolic CHF, HTN and HLD who presents to the office today for 55-month follow-up.  She was last examined by Katina Dung, NP on 12/10/2019 and reported having fatigue with minimal exertion and episodes of palpitations. A repeat echocardiogram was recommended to assess LV function and she was started on Lopressor 12.5 mg twice daily (patient was listed as being on Atenolol 100mg  daily at that time as well). Follow-up echocardiogram showed a preserved EF of 60 to 65% with no regional motion abnormalities. She did have grade 1 diastolic dysfunction, mild mitral valve regurgitation and mild aortic valve regurgitation.  In talking with the patient today, she reports still having dyspnea on exertion which occurs when walking around her home. She can have dyspnea at rest at times but is typically most notable with activities. She denies any associated chest pain or palpitations. No recent orthopnea, PND or lower extremity edema. She does take Lasix as needed for edema and reports having to utilize this 1-2 times weekly.  She does report her palpitations improved with initiation of Lopressor at the time of her last visit.  Past Medical History:  Diagnosis Date  . Adenomatous colon polyp 2006   excised in 2006 & 2010Due surveillance 06/2013  . Anxiety   . Anxiety and depression   . Asthma   . Breast mass    right nipple bengn mass per patient  . Breast tumor   . Chest discomfort   . Chronic back pain   . Chronic constipation   . Complication of anesthesia   . Depression   . Diverticulosis   . Fibromyalgia   . Gastroesophageal reflux disease   . Hiatal  hernia   . History of benign esophageal tumor 2010   granular cell esophageal tumor (Dx 06/2008), resected via EMR 2011, due repeat EGD 02/2012  . Hyperlipidemia   . Hypertension   . Insomnia   . Migraines   . PONV (postoperative nausea and vomiting)   . Psychosis ([place])   . Sleep apnea    Stop Bang score of 5. Pt said she was told by Dr. Merlene Laughter that she had "a little bit" of sleep apena, but not bad enough to treat.  . Thyroid nodule   . Tumor of esophagus     Past Surgical History:  Procedure Laterality Date  . ABDOMINAL HYSTERECTOMY    . BIOPSY  10/14/2019   Procedure: BIOPSY;  Surgeon: Daneil Dolin, MD;  Location: [facility];  Service: Endoscopy;;  . BRAVO [place] STUDY  12/11/2011   Procedure: BRAVO [place];  Surgeon: Daneil Dolin, MD;  Location: [facility];  Service: Endoscopy;  Laterality: N/A;  . 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 CHOLECYSTECTOMY N/A 04/09/2013   Procedure: LAPAROSCOPIC CHOLECYSTECTOMY;  Surgeon: Jamesetta So, MD;  Location: [facility] ORS;  Service: General;  Laterality: N/A;  . COLONOSCOPY  06/2008, 06/2011   sigmoid tics, tubular adenoma; 2013: anal canal hemorrhoids  . COLONOSCOPY  07/10/2011   Anal canal hemorrhoids likely the cause of  hematochezia in the setting of constipation; otherwise normal rectum ;submucosal  petechiae in left colon of doubtful clinical significance; otherwise, normal colon  . COLONOSCOPY WITH PROPOFOL N/A 11/10/2017   cancelled in pre-op  . COLONOSCOPY WITH PROPOFOL N/A 07/23/2018   Procedure: COLONOSCOPY WITH PROPOFOL;  Surgeon: Daneil Dolin, MD; 1 tubular adenoma, diverticulosis in the sigmoid and descending colon, nonbleeding internal hemorrhoids.  No recommendations to repeat due to age.  . ESOPHAGEAL DILATION N/A 05/08/2015   Procedure: ESOPHAGEAL DILATION;  Surgeon: Daneil Dolin, MD;  Location: [facility] ORS;  Service: Endoscopy;  Laterality: N/AVenia Minks 54/56  .  ESOPHAGOGASTRODUODENOSCOPY  12/11/2011   [id number] Schatzki's ring; otherwise normal/Small hiatal hernia. Antral and bulbar erosions  . ESOPHAGOGASTRODUODENOSCOPY (EGD) WITH PROPOFOL N/A 05/08/2015   Dr. Gala Romney: mild erosive reflux esophagitis, non-critical Schatzki's ring s/p dilation. Hiatal hernia.   Marland Kitchen ESOPHAGOGASTRODUODENOSCOPY (EGD) WITH PROPOFOL N/A 10/14/2019   Procedure: ESOPHAGOGASTRODUODENOSCOPY (EGD) WITH PROPOFOL;  Surgeon: Daneil Dolin, MD;  Nonobstructing Schatzki ring, small hiatal hernia, abnormal gastric mucosa s/p biopsied, normal first and second portion of the duodenum.  Pathology with chronic inactive gastritis, no H. pylori.  . EUS  08/2010   Dr [facility] with EGD. Retained food. No recurrent esophageal lesion, bx negative.  Marland Kitchen LUNG BIOPSY     negative  . PARATHYROIDECTOMY  April 2017   [facility].   Marland Kitchen PARATHYROIDECTOMY    . POLYPECTOMY  07/23/2018   Procedure: POLYPECTOMY;  Surgeon: Daneil Dolin, MD;  Location[facility];  Service: Endoscopy;;  colon  . RIGHT OOPHORECTOMY     benign disease  . SHOULDER ARTHROSCOPY     Right; bone spurs removed  . TONSILLECTOMY    . TOTAL KNEE ARTHROPLASTY  2002   Right; previous arthroscopic surgery    Current Medications: Outpatient Medications Prior to Visit  Medication Sig Dispense Refill  . albuterol (PROVENTIL HFA;VENTOLIN HFA) 108 (90 BASE) MCG/ACT inhaler Inhale 2 puffs into the lungs every 6 (six) hours as needed for wheezing or shortness of breath.     Marland Kitchen amLODipine (NORVASC) 10 MG tablet Take 10 mg by mouth daily.     Marland Kitchen CALCIUM-VITAMIN D PO Take 1 tablet by mouth daily.    . chlorthalidone (HYGROTON) 25 MG tablet Take 1/2 tablet by mouth daily. 45 tablet 2  . cycloSPORINE (RESTASIS) 0.05 % ophthalmic emulsion Place 1 drop into both eyes 2 (two) times daily.    Eduard Roux (AIMOVIG Nelson) Inject into the skin every 30 (thirty) days.     . famotidine (PEPCID) 20 MG tablet Take 20 mg by mouth daily.    .  fluticasone (FLONASE) 50 MCG/ACT nasal spray Place 1 spray into both nostrils daily as needed for allergies.     . furosemide (LASIX) 40 MG tablet Take 40 mg daily as needed for swelling. 90 tablet 3  . gabapentin (NEURONTIN) 300 MG capsule Take 300 mg by mouth at bedtime.     Marland Kitchen HYDROcodone-acetaminophen (NORCO/VICODIN) 5-325 MG tablet Take 1 tablet by mouth as needed for moderate pain or severe pain.     Marland Kitchen lovastatin (MEVACOR) 40 MG tablet Take 40 mg by mouth at bedtime.    . Melatonin 3 MG TABS Take 9 mg by mouth at bedtime.     . memantine (NAMENDA) 10 MG tablet Take 10 mg by mouth 2 (two) times daily.     Marland Kitchen neomycin-bacitracin-polymyxin (NEOSPORIN) [phone] ointment Apply 1 application topically as needed.     Marland Kitchen OLANZapine (ZYPREXA) 7.5  MG tablet Take 7.5 mg by mouth at bedtime.    . potassium chloride SA (KLOR-CON M20) 20 MEQ tablet Take 40 meq in the AM & 20 meq in the PM. 270 tablet 3  . RABEprazole (ACIPHEX) 20 MG tablet Take 1 tablet by mouth daily before breakfast. 30 tablet 10  . spironolactone (ALDACTONE) 25 MG tablet Take 25 mg by mouth daily.    . sucralfate (CARAFATE) 1 GM/10ML suspension Take 10 mLs (1 g total) by mouth 4 (four) times daily -  with meals and at bedtime. 420 mL 1  . topiramate (TOPAMAX) 100 MG tablet Take 100 mg by mouth at bedtime.     . traZODone (DESYREL) 100 MG tablet Take 300 mg by mouth at bedtime.   3  . venlafaxine XR (EFFEXOR-XR) 150 MG 24 hr capsule Take 150 mg by mouth at bedtime.     Marland Kitchen venlafaxine XR (EFFEXOR-XR) 75 MG 24 hr capsule Take 75 mg by mouth at bedtime.     Marland Kitchen atenolol (TENORMIN) 100 MG tablet Take 100 mg by mouth daily.    . metoprolol tartrate (LOPRESSOR) 25 MG tablet TAKE ONE-HALF TABLETS (12.5MG  TOTAL) BY MOUTH TWO TIMES DAILY 270 tablet 0  . metFORMIN (GLUCOPHAGE-XR) 500 MG 24 hr tablet Take 1,000 mg by mouth 2 (two) times daily.      No facility-administered medications prior to visit.     Allergies:   Elavil [amitriptyline],  Abilify [aripiprazole], Codeine, Latex, Penicillins, Polyethylene glycol, Sulfonamide derivatives, Cymbalta [duloxetine hcl], and Remeron [mirtazapine]   Social History   Socioeconomic History  . Marital status: Single    Spouse name: Not on file  . Number of children: 1  . Years of education: Not on file  . Highest education level: Not on file  Occupational History  . Occupation: disabled    Employer: RETIRED  Tobacco Use  . Smoking status: Never Smoker  . Smokeless tobacco: Never Used  Vaping Use  . Vaping Use: Never used  Substance and Sexual Activity  . Alcohol use: No    Alcohol/week: 0.0 standard drinks  . Drug use: No  . Sexual activity: Never  Other Topics Concern  . Not on file  Social History Narrative  . Not on file   Social Determinants of Health   Financial Resource Strain:   . Difficulty of Paying Living Expenses:   Food Insecurity:   . Worried About Charity fundraiser in the Last Year:   . Arboriculturist in the Last Year:   Transportation Needs:   . Film/video editor (Medical):   Marland Kitchen Lack of Transportation (Non-Medical):   Physical Activity:   . Days of Exercise per Week:   . Minutes of Exercise per Session:   Stress:   . Feeling of Stress :   Social Connections:   . Frequency of Communication with Friends and Family:   . Frequency of Social Gatherings with Friends and Family:   . Attends Religious Services:   . Active Member of Clubs or Organizations:   . Attends Archivist Meetings:   Marland Kitchen Marital Status:      Family History:  The patient's family history includes Alcohol abuse in her father; Anxiety disorder in her mother; Colon cancer in her paternal aunt and paternal uncle; Dementia in her maternal uncle; Depression in her mother; Heart attack in her mother; Stroke in her father.   Review of Systems:   Please see the history of present illness.  General:  No chills, fever, night sweats or weight changes.  Cardiovascular:  No  chest pain, edema, orthopnea, palpitations, paroxysmal nocturnal dyspnea. Positive for dyspnea on exertion.  Dermatological: No rash, lesions/masses Respiratory: No cough, dyspnea Urologic: No hematuria, dysuria Abdominal:   No nausea, vomiting, diarrhea, bright red blood per rectum, melena, or hematemesis Neurologic:  No visual changes, wkns, changes in mental status. All other systems reviewed and are otherwise negative except as noted above.   Physical Exam:    VS:  BP 128/80   Pulse 90   Ht 5\' 2"  (1.575 m)   Wt 176 lb (79.8 kg)   SpO2 99%   BMI 32.19 kg/m    General: Well developed, elderly female appearing in no acute distress. Head: Normocephalic, atraumatic. Neck: No carotid bruits. JVD not elevated.  Lungs: Respirations regular and unlabored, without wheezes or rales.  Heart: Regular rate and rhythm. No S3 or S4.  No murmur, no rubs, or gallops appreciated. Abdomen: Appears non-distended. No obvious abdominal masses. Msk:  Strength and tone appear normal for age. No obvious joint deformities or effusions. Extremities: No clubbing or cyanosis. Trace ankle edema.  Distal pedal pulses are 2+ bilaterally. Neuro: Alert and oriented X 3. Moves all extremities spontaneously. No focal deficits noted. Psych:  Responds to questions appropriately with a normal affect. Skin: No rashes or lesions noted  Wt Readings from Last 3 Encounters:  02/09/20 176 lb (79.8 kg)  12/10/19 173 lb (78.5 kg)  10/14/19 180 lb (81.6 kg)     Studies/Labs Reviewed:   EKG:  EKG is not ordered today.    Recent Labs: 06/01/2019: ALT 15; Magnesium 1.8; Platelets 277; TSH 2.559 10/14/2019: BUN 9; Creatinine, Ser 0.80; Hemoglobin 12.2; Potassium 4.0; Sodium 136   Lipid Panel    Component Value Date/Time   CHOL 147 06/01/2019 1045   TRIG 79 06/01/2019 1045   HDL 65 06/01/2019 1045   CHOLHDL 2.3 06/01/2019 1045   VLDL 16 06/01/2019 1045   LDLCALC 66 06/01/2019 1045    Additional studies/ records  that were reviewed today include:   Echocardiogram: 11/2019 IMPRESSIONS    1. Left ventricular ejection fraction, by estimation, is 60 to 65%. The  left ventricle has normal function. The left ventricle has no regional  wall motion abnormalities. Left ventricular diastolic parameters are  consistent with Grade I diastolic  dysfunction (impaired relaxation).  2. Right ventricular systolic function is normal. The right ventricular  size is normal. There is normal pulmonary artery systolic pressure.  3. The mitral valve is normal in structure. Mild mitral valve  regurgitation. No evidence of mitral stenosis.  4. The aortic valve is tricuspid. Aortic valve regurgitation is mild. No  aortic stenosis is present.  5. The inferior vena cava is normal in size with greater than 50%  respiratory variability, suggesting right atrial pressure of 3 mmHg.   Assessment:    1. Dyspnea on exertion   2. Chronic diastolic heart failure (HCC)   3. Palpitations   4. Essential hypertension      Plan:   In order of problems listed above:  1. Dyspnea on Exertion - She reports having dyspnea on exertion which has been occurring for several years but she does feel like this has progressed over the past 6+ months. Recent echocardiogram showed a preserved EF with diastolic dysfunction as outlined above. She does have multiple cardiac risk factors and also reports her younger sister recently underwent cardiac stent placement. I did recommend a [place]  Myoview for further ischemic evaluation but she wishes to hold off on further testing for now. I encouraged her to call us back if she wishes to proceed with this in the future.  2. Chronic Diastolic CHF - Recent echocardiogram showed a preserved EF of 60 to 65% with no regional motion abnormalities but she did have grade 1 diastolic dysfunction. She does experience intermittent lower extremity edema but denies any orthopnea or PND. Continue with  Chlorthalidone 12.5mg  daily and Lasix 40 mg as needed. If she did have to increase the frequency with which she is taking Lasix, would consider discontinuing Chlorthalidone.  3. Palpitations - She reports her symptoms improved after Metoprolol was initiated the time of her last visit but unfortunately she is also on Atenolol 100 mg daily and should not be on dual beta-blocker therapy.  Will plan to stop Metoprolol and further titrate Atenolol to 125 mg daily.  4. HTN - BP is well controlled at 128/80 during today's visit. Continue current medication regimen with Amlodipine 10 mg daily, Atenolol (with dose adjustment to 125 mg daily as outlined above), Chlorthalidone 12.5 mg daily and Spironolactone 25 mg daily.   Medication Adjustments/Labs and Tests Ordered: Current medicines are reviewed at length with the patient today.  Concerns regarding medicines are outlined above.  Medication changes, Labs and Tests ordered today are listed in the Patient Instructions below. Patient Instructions  Medication Instructions:  STOP Metoprolol  INCREASE Atenolol to 125 mg daily ( you will take a 100 mg tablet AND a 25 mg tablet )    *If you need a refill on your cardiac medications before your next appointment, please call your pharmacy*   Lab Work: None today If you have labs (blood work) drawn today and your tests are completely normal, you will receive your results only by: Marland Kitchen MyChart Message (if you have MyChart) OR . A paper copy in the mail If you have any lab test that is abnormal or we need to change your treatment, we will call you to review the results.   Testing/Procedures: None today   Follow-Up: At [facility], you and your health needs are our priority.  As part of our continuing mission to provide you with exceptional heart care, we have created designated Provider Care Teams.  These Care Teams include your primary Cardiologist (physician) and Advanced Practice Providers (APPs -   Physician Assistants and Nurse Practitioners) who all work together to provide you with the care you need, when you need it.  We recommend signing up for the patient portal called "MyChart".  Sign up information is provided on this After Visit Summary.  MyChart is used to connect with patients for Virtual Visits (Telemedicine).  Patients are able to view lab/test results, encounter notes, upcoming appointments, etc.  Non-urgent messages can be sent to your provider as well.   To learn more about what you can do with MyChart, go to [email or web address].    Your next appointment:   6 month(s)  The format for your next appointment:   In Person  Provider:   Carlyle Dolly, MD   Other Instructions None      Thank you for choosing [facility] !          Signed, Erma Heritage, PA-C  02/09/2020 5:34 PM    [facility] [address] [place], [place] [address] Phone: [phone] Fax: [phone]

## 2020-02-14 ENCOUNTER — Other Ambulatory Visit: Payer: Self-pay

## 2020-02-14 ENCOUNTER — Ambulatory Visit (INDEPENDENT_AMBULATORY_CARE_PROVIDER_SITE_OTHER): Payer: Medicare Other | Admitting: Orthopedic Surgery

## 2020-02-14 ENCOUNTER — Encounter: Payer: Self-pay | Admitting: Orthopedic Surgery

## 2020-02-14 VITALS — BP 146/82 | HR 81 | Ht 62.0 in | Wt 177.0 lb

## 2020-02-14 DIAGNOSIS — M25562 Pain in left knee: Secondary | ICD-10-CM | POA: Diagnosis not present

## 2020-02-14 DIAGNOSIS — G8929 Other chronic pain: Secondary | ICD-10-CM | POA: Diagnosis not present

## 2020-02-14 DIAGNOSIS — M7052 Other bursitis of knee, left knee: Secondary | ICD-10-CM

## 2020-02-14 NOTE — Progress Notes (Signed)
Progress Note   [name], female   DOB: 10/10/1945, 74 y.o.   MRN: [id number]  Body mass index is 32.37 kg/m.  Chief Complaint  Patient presents with  . Knee Pain    chronic left knee pain, about the same, still hurts some worse at night time    Encounter Diagnoses  Name Primary?  . Chronic pain of left knee Yes  . Pes anserinus bursitis of left knee     74 year old female history of osteoarthritis left knee status post right total knee complains of chronic pain right knee we could never find any problems mechanically related to right knee pain  Patient has pain on the medial side of her left knee as well as the medial lateral joint line and patellofemoral joint BMI is 32    Review of Systems  Constitutional: Negative for fever.  Musculoskeletal: Positive for joint pain.  Psychiatric/Behavioral: Depression: .pmh.   Past Medical History:  Diagnosis Date  . Adenomatous colon polyp 2006   excised in 2006 & 2010Due surveillance 06/2013  . Anxiety   . Anxiety and depression   . Asthma   . Breast mass    right nipple bengn mass per patient  . Breast tumor   . Chest discomfort   . Chronic back pain   . Chronic constipation   . Complication of anesthesia   . Depression   . Diverticulosis   . Fibromyalgia   . Gastroesophageal reflux disease   . Hiatal hernia   . History of benign esophageal tumor 2010   granular cell esophageal tumor (Dx 06/2008), resected via EMR 2011, due repeat EGD 02/2012  . Hyperlipidemia   . Hypertension   . Insomnia   . Migraines   . PONV (postoperative nausea and vomiting)   . Psychosis ([place])   . Sleep apnea    Stop Bang score of 5. Pt said she was told by Dr. Merlene Laughter that she had "a little bit" of sleep apena, but not bad enough to treat.  . Thyroid nodule   . Tumor of esophagus       BP (!) 146/82   Pulse 81   Ht 5\' 2"  (1.575 m)   Wt 177 lb (80.3 kg)   BMI 32.37 kg/m   Physical Exam She is awake and alert she is  oriented x3 her mood is pleasantly depressed with flat affect although she is not depressed this is her normal affect  Her left knee is tender around the patellofemoral joint medial lateral joint line and pes bursa  Her range of motion is still 120 degrees with a slight flexion contracture ligaments are stable muscle tone is normal  MEDICAL DECISION MAKING Encounter Diagnoses  Name Primary?  . Chronic pain of left knee Yes  . Pes anserinus bursitis of left knee     DATA ANALYSED:  IMAGING:  No imaging today  C. MANAGEMENT   Inject knee joint and pes bursa  Procedure note left knee injection   verbal consent was obtained to inject left knee joint  Timeout was completed to confirm the site of injection  The medications used were 40 mg of Depo-Medrol and 1% lidocaine 3 cc  Anesthesia was provided by ethyl chloride and the skin was prepped with alcohol.  After cleaning the skin with alcohol a 20-gauge needle was used to inject the left knee joint. There were no complications. A sterile bandage was applied.  Procedure note Left knee injection for bursitis  verbal consent was obtained to inject Left knee PES BURSA  Timeout was completed to confirm the site of injection  The medications used were 40 mg of Depo-Medrol and 1% lidocaine 3 cc  Anesthesia was provided by ethyl chloride and the skin was prepped with alcohol.  After cleaning the skin with alcohol a 25-gauge needle was used to inject the left knee bursa   There were no complications and a sterile bandage was applied   No orders of the defined types were placed in this encounter.   Arther Abbott, MD 02/14/2020 3:42 PM

## 2020-02-14 NOTE — Patient Instructions (Addendum)
You have received an injection of steroids into the joint. 15% of patients will have increased pain within the 24 hours postinjection.   This is transient and will go away.   We recommend that you use ice packs on the injection site for 20 minutes every 2 hours and extra strength Tylenol 2 tablets every 8 as needed until the pain resolves.  If you continue to have pain after taking the Tylenol and using the ice please call the office for further instructions.  Pes Anserine Bursitis  The pes anserine is an area on the inside of your knee, just below the joint, that is cushioned by a fluid-filled sac (bursa). Pes anserine bursitis is a condition that happens when the bursa gets swollen and irritated. The condition causes knee pain. What are the causes? This condition may be caused by:  Making the same movement over and over.  A direct hit (trauma) to the inside of the leg. What increases the risk? You are more likely to develop this condition if you:  Are a runner.  Play sports that involve a lot of running and quick side-to-side movements (cutting).  Are an athlete who plays contact sports.  Swim using an inward angle of the knee, such as with the breaststroke.  Have tight hamstring muscles.  Are a woman.  Are overweight.  Have flat feet.  Have diabetes or osteoarthritis. What are the signs or symptoms? Symptoms of this condition include:  Knee pain that gets better with rest and worse with activities like climbing stairs, walking, running, or getting in and out of a chair.  Swelling.  Warmth.  Tenderness when pressing at the inside of the lower leg, just below the knee. How is this diagnosed? This condition may be diagnosed based on:  Your symptoms.  Your medical history.  A physical exam. ? During your physical exam, your health care provider will press on the tendon attachment to see if you feel pain. ? Your health care provider may also check your hip and  knee motion and strength.  Tests to check for swelling and fluid buildup in the bursa and to look at muscles, bones, and tendons. These tests might include: ? X-rays. ? MRI. ? Ultrasound. How is this treated? This condition may be treated by:  Resting your knee. You may be told to raise (elevate) your knee while resting.  Avoiding activities that cause pain.  Icing the inside of your knee.  Sleeping with a pillow between your knees. This will cushion your injured knee.  Taking medicine by mouth (orally) to reduce pain and swelling or having medicine injected into your knee.  Doing strengthening and stretching exercises (physical therapy). If these treatments do not work or if the condition keeps coming back, you may need to have surgery to remove the bursa. Follow these instructions at home: Managing pain, stiffness, and swelling   If directed, put ice on the injured area. ? Put ice in a plastic bag. ? Place a towel between your skin and the bag. ? Leave the ice on for 20 minutes, 2-3 times a day.  Elevate the injured area above the level of your heart while you are sitting or lying down. Activity  Return to your normal activities as told by your health care provider. Ask your health care provider what activities are safe for you.  Do exercises as told by your health care provider. General instructions  Take over-the-counter and prescription medicines only as told by your health  care provider.  Sleep with a pillow between your knees.  Do not use any products that contain nicotine or tobacco, such as cigarettes, e-cigarettes, and chewing tobacco. These can delay healing. If you need help quitting, ask your health care provider.  If you are overweight, work with your health care provider and a dietitian to set a weight-loss goal that is healthy and reasonable for you.  Keep all follow-up visits as told by your health care provider. This is important. How is this  prevented?  When exercising, make sure that you: ? Warm up and stretch before being active. ? Cool down and stretch after being active. ? Give your body time to rest between periods of activity. ? Use equipment that fits you. ? Are safe and responsible while being active to avoid falls. ? Do at least 150 minutes of moderate-intensity exercise each week, such as brisk walking or water aerobics. ? Maintain physical fitness, including:  Strength.  Flexibility.  Cardiovascular fitness.  Endurance. ? Maintain a healthy weight. Contact a health care provider if:  Your symptoms do not improve.  Your symptoms get worse. Summary  Pes anserine bursitis is a condition that happens when the fluid-filled sac (bursa) at the inside of your knee gets swollen and irritated. The condition causes knee pain.  Treatment for pes anserine bursitis may include resting your knee, icing the inside of your knee, sleeping with a pillow between your knees, taking medicine by mouth or by injection, and doing strengthening and stretching exercises (physical therapy).  Follow instructions for managing pain, stiffness, and swelling.  Take over-the-counter and prescription medicines only as told by your health care provider. This information is not intended to replace advice given to you by your health care provider. Make sure you discuss any questions you have with your health care provider. Document Revised: 10/01/2018 Document Reviewed: 11/19/2017 Elsevier Patient Education  [place].

## 2020-02-25 DIAGNOSIS — R7301 Impaired fasting glucose: Secondary | ICD-10-CM | POA: Diagnosis not present

## 2020-02-25 DIAGNOSIS — E785 Hyperlipidemia, unspecified: Secondary | ICD-10-CM | POA: Diagnosis not present

## 2020-02-25 DIAGNOSIS — Z1329 Encounter for screening for other suspected endocrine disorder: Secondary | ICD-10-CM | POA: Diagnosis not present

## 2020-03-01 DIAGNOSIS — G43009 Migraine without aura, not intractable, without status migrainosus: Secondary | ICD-10-CM | POA: Diagnosis not present

## 2020-03-01 DIAGNOSIS — J302 Other seasonal allergic rhinitis: Secondary | ICD-10-CM | POA: Diagnosis not present

## 2020-03-01 DIAGNOSIS — J4530 Mild persistent asthma, uncomplicated: Secondary | ICD-10-CM | POA: Diagnosis not present

## 2020-03-01 DIAGNOSIS — I10 Essential (primary) hypertension: Secondary | ICD-10-CM | POA: Diagnosis not present

## 2020-03-01 DIAGNOSIS — G43701 Chronic migraine without aura, not intractable, with status migrainosus: Secondary | ICD-10-CM | POA: Diagnosis not present

## 2020-03-01 DIAGNOSIS — E876 Hypokalemia: Secondary | ICD-10-CM | POA: Diagnosis not present

## 2020-03-17 NOTE — Progress Notes (Signed)
Referring Provider: Andrey Campanile, MD Primary Care Physician:  Celene Squibb, MD Primary GI Physician: Dr. Gala Romney  Chief Complaint  Patient presents with  . Bloated    acid reflux    HPI:   [name] is a 74 y.o. female presenting today for follow-up of GERD, bloating, and chronic constipation.  She has history of GERD, esophageal granular cell tumor s/p EMR in 2011 by Dr. Newman Pies with [facility]. Has also seen Dr. Newman Pies for uncontrolled GERD in 2014 s/p EGDs, normal GES, and a variety of PPIs. Manometry had been attempted but she was unable to tolerate the probe. EGD repeated in April 2021 due to uncontrolled GERD/odynophagia revealing nonobstructing Schatzki's ring, small hiatal hernia, abnormal gastric mucosa (chronic inactive gastritis, no H. pylori on biopsy), normal examined duodenum.  Suspected daily Goody powders likely explain refractory upper GI symptoms.  Also withchronic constipation likely opioid induced, andadenomatous colon polyps with last colonoscopy in January 2020 and no recommendations to repeat due to age.   Last seen via virtual visit 11/18/2019.  She felt upper GI symptoms were improved with the addition of Carafate to Aciphex twice daily.  No breakthrough GERD symptoms.  No esophageal burning.  Mild intermittent discomfort below her sternum daily which she described as more of a bloated sensation.  Also with increased gas that improved with burping/passing gas.  Discomfort was not worsened by meals, associated with bowel habits, or certain movements.  Stated it could last a couple minutes to all day. Pain had started prior to EGD. Admitted to eating fried foods frequently, soda a few times a week, beans, milk, and salads. No N/V.  She had stopped goody powders. She was having BMs daily not taking trulance. Suspected bloating was likely influenced by dietary habits, possible delay in stomach emptying with addition of Norco recently. Advised to continue current  medications, add daily probiotic, simethicone daily, lactose free and low fat diet, avoid gas producing items, and monitor for worsening. Consider GES, celiac serologies, or evaluation for SIBO in the future if symptoms persist. Follow-up in 4 months.   Today: [place] in the shower last Sunday, 1 week ago. She is having back and neck pain. She has not seen her PCP about this. Taking norco for pain. Also taking tylenol. Taking 1 goody powder daily. No ibuprofen or advil.   Patient states GI symptoms had been doing well until she fell in the shower. Having a lot of burning in her esophagus. All day every day.  Taking aciphex BID and papcid as needed. Not taking carafate.   Bloating has resolved. Limiting fried/fatty foods, limiting soda, and limiting beans. Also added Align.   No dysphagia. No abdominal pain. No belching. Appetite is good. No early satiety. No nausea or vomiting.   BMs every 3-4 days. Stools are soft. Has to strain at times. Taking Trulance once every other week. When she takes it, it helps. She isn't taking it daily just because, "I don't want to take it every day." No blood in the stool or black stools.   Past Medical History:  Diagnosis Date  . Adenomatous colon polyp 2006   excised in 2006 & 2010Due surveillance 06/2013  . Anxiety   . Anxiety and depression   . Asthma   . Breast mass    right nipple bengn mass per patient  . Breast tumor   . Chest discomfort   . Chronic back pain   . Chronic constipation   . Complication  of anesthesia   . Depression   . Diverticulosis   . Fibromyalgia   . Gastroesophageal reflux disease   . Hiatal hernia   . History of benign esophageal tumor 2010   granular cell esophageal tumor (Dx 06/2008), resected via EMR 2011, due repeat EGD 02/2012  . Hyperlipidemia   . Hypertension   . Insomnia   . Migraines   . PONV (postoperative nausea and vomiting)   . Psychosis ([place])   . Sleep apnea    Stop Bang score of 5. Pt said she was told by  Dr. Merlene Laughter that she had "a little bit" of sleep apena, but not bad enough to treat.  . Thyroid nodule   . Tumor of esophagus     Past Surgical History:  Procedure Laterality Date  . ABDOMINAL HYSTERECTOMY    . BIOPSY  10/14/2019   Procedure: BIOPSY;  Surgeon: Daneil Dolin, MD;  Location: [facility];  Service: Endoscopy;;  . BRAVO [place] STUDY  12/11/2011   Procedure: BRAVO [place];  Surgeon: Daneil Dolin, MD;  Location: [facility];  Service: Endoscopy;  Laterality: N/A;  . 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 CHOLECYSTECTOMY N/A 04/09/2013   Procedure: LAPAROSCOPIC CHOLECYSTECTOMY;  Surgeon: Jamesetta So, MD;  Location: [facility] ORS;  Service: General;  Laterality: N/A;  . COLONOSCOPY  06/2008, 06/2011   sigmoid tics, tubular adenoma; 2013: anal canal hemorrhoids  . COLONOSCOPY  07/10/2011   Anal canal hemorrhoids likely the cause of hematochezia in the setting of constipation; otherwise normal rectum ;submucosal  petechiae in left colon of doubtful clinical significance; otherwise, normal colon  . COLONOSCOPY WITH PROPOFOL N/A 11/10/2017   cancelled in pre-op  . COLONOSCOPY WITH PROPOFOL N/A 07/23/2018   Procedure: COLONOSCOPY WITH PROPOFOL;  Surgeon: Daneil Dolin, MD; 1 tubular adenoma, diverticulosis in the sigmoid and descending colon, nonbleeding internal hemorrhoids.  No recommendations to repeat due to age.  . ESOPHAGEAL DILATION N/A 05/08/2015   Procedure: ESOPHAGEAL DILATION;  Surgeon: Daneil Dolin, MD;  Location: [facility];  Service: Endoscopy;  Laterality: N/AVenia Minks 54/56  . ESOPHAGOGASTRODUODENOSCOPY  12/11/2011   ERD:[id number] Schatzki's ring; otherwise normal/Small hiatal hernia. Antral and bulbar erosions  . ESOPHAGOGASTRODUODENOSCOPY (EGD) WITH PROPOFOL N/A 05/08/2015   Dr. Gala Romney: mild erosive reflux esophagitis, non-critical Schatzki's ring s/p dilation. Hiatal hernia.   Marland Kitchen ESOPHAGOGASTRODUODENOSCOPY (EGD) WITH  PROPOFOL N/A 10/14/2019   Procedure: ESOPHAGOGASTRODUODENOSCOPY (EGD) WITH PROPOFOL;  Surgeon: Daneil Dolin, MD;  Nonobstructing Schatzki ring, small hiatal hernia, abnormal gastric mucosa s/p biopsied, normal first and second portion of the duodenum.  Pathology with chronic inactive gastritis, no H. pylori.  . EUS  08/2010   Dr [facility] with EGD. Retained food. No recurrent esophageal lesion, bx negative.  Marland Kitchen LUNG BIOPSY     negative  . PARATHYROIDECTOMY  April 2017   [facility].   Marland Kitchen PARATHYROIDECTOMY    . POLYPECTOMY  07/23/2018   Procedure: POLYPECTOMY;  Surgeon: Daneil Dolin, MD;  Location[facility];  Service: Endoscopy;;  colon  . RIGHT OOPHORECTOMY     benign disease  . SHOULDER ARTHROSCOPY     Right; bone spurs removed  . TONSILLECTOMY    . TOTAL KNEE ARTHROPLASTY  2002   Right; previous arthroscopic surgery    Current Outpatient Medications  Medication Sig Dispense Refill  . albuterol (PROVENTIL HFA;VENTOLIN HFA) 108 (90 BASE) MCG/ACT inhaler Inhale 2 puffs into the lungs every 6 (  six) hours as needed for wheezing or shortness of breath.     Marland Kitchen amLODipine (NORVASC) 10 MG tablet Take 10 mg by mouth daily.     Marland Kitchen atenolol (TENORMIN) 100 MG tablet Take 1 tablet (100 mg total) by mouth daily. 90 tablet 3  . atenolol (TENORMIN) 25 MG tablet Take 25 mg daily in addition to 100 mg daily for total dose of 125 mg daily 90 tablet 3  . CALCIUM-VITAMIN D PO Take 1 tablet by mouth daily.    . chlorthalidone (HYGROTON) 25 MG tablet Take 1/2 tablet by mouth daily. 45 tablet 2  . cycloSPORINE (RESTASIS) 0.05 % ophthalmic emulsion Place 1 drop into both eyes 2 (two) times daily.    Eduard Roux (AIMOVIG [place]) Inject into the skin every 30 (thirty) days.     . famotidine (PEPCID) 20 MG tablet Take 20 mg by mouth daily.    . fluticasone (FLONASE) 50 MCG/ACT nasal spray Place 1 spray into both nostrils daily as needed for allergies.     . furosemide (LASIX) 40 MG tablet Take 40 mg daily as  needed for swelling. 90 tablet 3  . gabapentin (NEURONTIN) 300 MG capsule Take 300 mg by mouth at bedtime.     Marland Kitchen HYDROcodone-acetaminophen (NORCO/VICODIN) 5-325 MG tablet Take 1 tablet by mouth as needed for moderate pain or severe pain.     Marland Kitchen lovastatin (MEVACOR) 40 MG tablet Take 40 mg by mouth at bedtime.    . Melatonin 3 MG TABS Take 9 mg by mouth at bedtime.     . memantine (NAMENDA) 10 MG tablet Take 10 mg by mouth 2 (two) times daily.     Marland Kitchen neomycin-bacitracin-polymyxin (NEOSPORIN) [phone] ointment Apply 1 application topically as needed.     Marland Kitchen OLANZapine (ZYPREXA) 7.5 MG tablet Take 7.5 mg by mouth at bedtime.    . potassium chloride SA (KLOR-CON M20) 20 MEQ tablet Take 40 meq in the AM & 20 meq in the PM. 270 tablet 3  . RABEprazole (ACIPHEX) 20 MG tablet Take 1 tablet by mouth daily before breakfast. 30 tablet 10  . Semaglutide (RYBELSUS) 7 MG TABS Take by mouth.    . spironolactone (ALDACTONE) 25 MG tablet Take 25 mg by mouth daily.    Marland Kitchen topiramate (TOPAMAX) 100 MG tablet Take 100 mg by mouth at bedtime.     . traZODone (DESYREL) 100 MG tablet Take 300 mg by mouth at bedtime.   3  . venlafaxine XR (EFFEXOR-XR) 150 MG 24 hr capsule Take 150 mg by mouth at bedtime.     Marland Kitchen venlafaxine XR (EFFEXOR-XR) 75 MG 24 hr capsule Take 75 mg by mouth at bedtime.     . sucralfate (CARAFATE) 1 GM/10ML suspension Take 10 mLs (1 g total) by mouth 4 (four) times daily -  with meals and at bedtime. (Patient not taking: Reported on 03/20/2020) 420 mL 1   No current facility-administered medications for this visit.    Allergies as of 03/20/2020 - Review Complete 03/20/2020  Allergen Reaction Noted  . Elavil [amitriptyline] Other (See Comments) 06/03/2012  . Abilify [aripiprazole] Other (See Comments) 11/25/2012  . Codeine Hives, Nausea Only, and Other (See Comments)   . Latex Hives 11/21/2011  . Penicillins Hives, Itching, and Other (See Comments)   . Polyethylene glycol Other (See Comments)  12/26/2012  . Sulfonamide derivatives Nausea And Vomiting   . Cymbalta [duloxetine hcl] Rash 03/10/2013  . Remeron [mirtazapine] Other (See Comments) 05/06/2012    Family History  Problem Relation Age of Onset  . Stroke Father   . Alcohol abuse Father   . Heart attack Mother   . Depression Mother   . Anxiety disorder Mother   . Colon cancer Paternal Aunt   . Colon cancer Paternal Uncle   . Dementia Maternal Uncle   . ADD / ADHD Neg Hx   . Bipolar disorder Neg Hx   . Drug abuse Neg Hx   . OCD Neg Hx   . Paranoid behavior Neg Hx   . Schizophrenia Neg Hx   . Seizures Neg Hx   . Sexual abuse Neg Hx   . Physical abuse Neg Hx     Social History   Socioeconomic History  . Marital status: Single    Spouse name: Not on file  . Number of children: 1  . Years of education: Not on file  . Highest education level: Not on file  Occupational History  . Occupation: disabled    Employer: RETIRED  Tobacco Use  . Smoking status: Never Smoker  . Smokeless tobacco: Never Used  Vaping Use  . Vaping Use: Never used  Substance and Sexual Activity  . Alcohol use: No    Alcohol/week: 0.0 standard drinks  . Drug use: No  . Sexual activity: Never  Other Topics Concern  . Not on file  Social History Narrative  . Not on file   Social Determinants of Health   Financial Resource Strain:   . Difficulty of Paying Living Expenses: Not on file  Food Insecurity:   . Worried About Charity fundraiser in the Last Year: Not on file  . Ran Out of Food in the Last Year: Not on file  Transportation Needs:   . Lack of Transportation (Medical): Not on file  . Lack of Transportation (Non-Medical): Not on file  Physical Activity:   . Days of Exercise per Week: Not on file  . Minutes of Exercise per Session: Not on file  Stress:   . Feeling of Stress : Not on file  Social Connections:   . Frequency of Communication with Friends and Family: Not on file  . Frequency of Social Gatherings with  Friends and Family: Not on file  . Attends Religious Services: Not on file  . Active Member of Clubs or Organizations: Not on file  . Attends Archivist Meetings: Not on file  . Marital Status: Not on file    Review of Systems: Gen: Denies fever, chills, lightheadedness, dizziness, pre-syncope, or syncope.  CV: Denies chest pain or palpitations Resp: Denies dyspnea or cough GI: See HPI Heme: See HPI  Physical Exam: BP 137/78   Pulse 71   Temp 98.3 F (36.8 C) (Oral)   Ht 5\' 2"  (1.575 m)   Wt 177 lb 6.4 oz (80.5 kg)   BMI 32.45 kg/m  General:   Alert and oriented. No distress noted. Pleasant and cooperative.  Head:  Normocephalic and atraumatic. Eyes:  Conjuctiva clear without scleral icterus. Heart:  S1, S2 present without murmurs appreciated. Lungs:  Clear to auscultation bilaterally. No wheezes, rales, or rhonchi. No distress.  Abdomen:  +BS, soft, non-tender and non-distended. No rebound or guarding. No HSM or masses noted. Msk:  Symmetrical without gross deformities. Normal posture. TTP along paraspinal muscles along left of her spine extending from neck all the way down her spine. No point tenderness over spine.  Extremities:  Without edema. Neurologic:  Alert and  oriented x4 Psych:  Normal mood and  affect.

## 2020-03-20 ENCOUNTER — Ambulatory Visit (INDEPENDENT_AMBULATORY_CARE_PROVIDER_SITE_OTHER): Payer: Medicare Other | Admitting: Gastroenterology

## 2020-03-20 ENCOUNTER — Encounter: Payer: Self-pay | Admitting: Gastroenterology

## 2020-03-20 ENCOUNTER — Other Ambulatory Visit: Payer: Self-pay

## 2020-03-20 VITALS — BP 137/78 | HR 71 | Temp 98.3°F | Ht 62.0 in | Wt 177.4 lb

## 2020-03-20 DIAGNOSIS — R140 Abdominal distension (gaseous): Secondary | ICD-10-CM

## 2020-03-20 DIAGNOSIS — M542 Cervicalgia: Secondary | ICD-10-CM

## 2020-03-20 DIAGNOSIS — K219 Gastro-esophageal reflux disease without esophagitis: Secondary | ICD-10-CM

## 2020-03-20 DIAGNOSIS — K5909 Other constipation: Secondary | ICD-10-CM

## 2020-03-20 NOTE — Assessment & Plan Note (Signed)
Resolved with dietary adjustments and adding Align daily. Advised she continue with her current regimen and monitor.

## 2020-03-20 NOTE — Assessment & Plan Note (Signed)
Patient reports neck and back pain following a fall in the shower 1 week ago. Exam with TTP along paraspinal muscles along left of her spine extending from neck all the way down her spine. No point tenderness along spine. Advised she follow-up with PCP on this for further evaluation.

## 2020-03-20 NOTE — Patient Instructions (Addendum)
Please also call your primary care doctor to follow-up on your recent fall, back pain, and neck pain.  I suspect the burning in your esophagus is likely secondary to Kake powder use.    Please stop Goody powders and avoid all NSAID products including ibuprofen, Aleve, Advil, naproxen, and anything else as "NSAID" on the package.  Resume taking Carafate daily.  You may take this medication up to 3 times daily and at bedtime.  Continue AcipHex 20 mg twice daily.  Continue famotidine as needed for breakthrough acid reflux symptoms.  Avoid fried, fatty, greasy, spicy, citrus foods.  Avoid caffeine and carbonated beverages.  For constipation, resume taking Trulance 3 mg daily. If this does not control your constipation well, please let me know.  We will plan to see back in 3 months.  Do not hesitate to call if you have questions or concerns prior.  Aliene Altes, PA-C [facility]     Gastroesophageal Reflux Disease, Adult Gastroesophageal reflux (GER) happens when acid from the stomach flows up into the tube that connects the mouth and the stomach (esophagus). Normally, food travels down the esophagus and stays in the stomach to be digested. With GER, food and stomach acid sometimes move back up into the esophagus. You may have a disease called gastroesophageal reflux disease (GERD) if the reflux:  Happens often.  Causes frequent or very bad symptoms.  Causes problems such as damage to the esophagus. When this happens, the esophagus becomes sore and swollen (inflamed). Over time, GERD can make small holes (ulcers) in the lining of the esophagus. What are the causes? This condition is caused by a problem with the muscle between the esophagus and the stomach. When this muscle is weak or not normal, it does not close properly to keep food and acid from coming back up from the stomach. The muscle can be weak because of:  Tobacco use.  Pregnancy.  Having a certain type  of hernia (hiatal hernia).  Alcohol use.  Certain foods and drinks, such as coffee, chocolate, onions, and peppermint. What increases the risk? You are more likely to develop this condition if you:  Are overweight.  Have a disease that affects your connective tissue.  Use NSAID medicines. What are the signs or symptoms? Symptoms of this condition include:  Heartburn.  Difficult or painful swallowing.  The feeling of having a lump in the throat.  A bitter taste in the mouth.  Bad breath.  Having a lot of saliva.  Having an upset or bloated stomach.  Belching.  Chest pain. Different conditions can cause chest pain. Make sure you see your doctor if you have chest pain.  Shortness of breath or noisy breathing (wheezing).  Ongoing (chronic) cough or a cough at night.  Wearing away of the surface of teeth (tooth enamel).  Weight loss. How is this treated? Treatment will depend on how bad your symptoms are. Your doctor may suggest:  Changes to your diet.  Medicine.  Surgery. Follow these instructions at home: Eating and drinking   Follow a diet as told by your doctor. You may need to avoid foods and drinks such as: ? Coffee and tea (with or without caffeine). ? Drinks that contain alcohol. ? Energy drinks and sports drinks. ? Bubbly (carbonated) drinks or sodas. ? Chocolate and cocoa. ? Peppermint and mint flavorings. ? Garlic and onions. ? Horseradish. ? Spicy and acidic foods. These include peppers, chili powder, curry powder, vinegar, hot sauces, and BBQ sauce. ? Citrus  fruit juices and citrus fruits, such as oranges, lemons, and limes. ? Tomato-based foods. These include red sauce, chili, salsa, and pizza with red sauce. ? Fried and fatty foods. These include donuts, french fries, potato chips, and high-fat dressings. ? High-fat meats. These include hot dogs, rib eye steak, sausage, ham, and bacon. ? High-fat dairy items, such as whole milk, butter, and  cream cheese.  Eat small meals often. Avoid eating large meals.  Avoid drinking large amounts of liquid with your meals.  Avoid eating meals during the 2-3 hours before bedtime.  Avoid lying down right after you eat.  Do not exercise right after you eat. Lifestyle   Do not use any products that contain nicotine or tobacco. These include cigarettes, e-cigarettes, and chewing tobacco. If you need help quitting, ask your doctor.  Try to lower your stress. If you need help doing this, ask your doctor.  If you are overweight, lose an amount of weight that is healthy for you. Ask your doctor about a safe weight loss goal. General instructions  Pay attention to any changes in your symptoms.  Take over-the-counter and prescription medicines only as told by your doctor. Do not take aspirin, ibuprofen, or other NSAIDs unless your doctor says it is okay.  Wear loose clothes. Do not wear anything tight around your waist.  Raise (elevate) the head of your bed about 6 inches (15 cm).  Avoid bending over if this makes your symptoms worse.  Keep all follow-up visits as told by your doctor. This is important. Contact a doctor if:  You have new symptoms.  You lose weight and you do not know why.  You have trouble swallowing or it hurts to swallow.  You have wheezing or a cough that keeps happening.  Your symptoms do not get better with treatment.  You have a hoarse voice. Get help right away if:  You have pain in your arms, neck, jaw, teeth, or back.  You feel sweaty, dizzy, or light-headed.  You have chest pain or shortness of breath.  You throw up (vomit) and your throw-up looks like blood or coffee grounds.  You pass out (faint).  Your poop (stool) is bloody or black.  You cannot swallow, drink, or eat. Summary  If a person has gastroesophageal reflux disease (GERD), food and stomach acid move back up into the esophagus and cause symptoms or problems such as damage to  the esophagus.  Treatment will depend on how bad your symptoms are.  Follow a diet as told by your doctor.  Take all medicines only as told by your doctor. This information is not intended to replace advice given to you by your health care provider. Make sure you discuss any questions you have with your health care provider. Document Revised: 12/17/2017 Document Reviewed: 12/17/2017 Elsevier Patient Education  2020 [place] for Gastroesophageal Reflux Disease, Adult When you have gastroesophageal reflux disease (GERD), the foods you eat and your eating habits are very important. Choosing the right foods can help ease the discomfort of GERD. Consider working with a diet and nutrition specialist (dietitian) to help you make healthy food choices. What general guidelines should I follow?  Eating plan  Choose healthy foods low in fat, such as fruits, vegetables, whole grains, low-fat dairy products, and lean meat, fish, and poultry.  Eat frequent, small meals instead of three large meals each day. Eat your meals slowly, in a relaxed setting. Avoid bending over or lying down until  2-3 hours after eating.  Limit high-fat foods such as fatty meats or fried foods.  Limit your intake of oils, butter, and shortening to less than 8 teaspoons each day.  Avoid the following: ? Foods that cause symptoms. These may be different for different people. Keep a food diary to keep track of foods that cause symptoms. ? Alcohol. ? Drinking large amounts of liquid with meals. ? Eating meals during the 2-3 hours before bed.  Cook foods using methods other than frying. This may include baking, grilling, or broiling. Lifestyle  Maintain a healthy weight. Ask your health care provider what weight is healthy for you. If you need to lose weight, work with your health care provider to do so safely.  Exercise for at least 30 minutes on 5 or more days each week, or as told by your health care  provider.  Avoid wearing clothes that fit tightly around your waist and chest.  Do not use any products that contain nicotine or tobacco, such as cigarettes and e-cigarettes. If you need help quitting, ask your health care provider.  Sleep with the head of your bed raised. Use a wedge under the mattress or blocks under the bed frame to raise the head of the bed. What foods are not recommended? The items listed may not be a complete list. Talk with your dietitian about what dietary choices are best for you. Grains Pastries or quick breads with added fat. [place] toast. Vegetables Deep fried vegetables. [place] fries. Any vegetables prepared with added fat. Any vegetables that cause symptoms. For some people this may include tomatoes and tomato products, chili peppers, onions and garlic, and horseradish. Fruits Any fruits prepared with added fat. Any fruits that cause symptoms. For some people this may include citrus fruits, such as oranges, grapefruit, pineapple, and lemons. Meats and other protein foods High-fat meats, such as fatty beef or pork, hot dogs, ribs, ham, sausage, salami and bacon. Fried meat or protein, including fried fish and fried chicken. Nuts and nut butters. Dairy Whole milk and chocolate milk. Sour cream. Cream. Ice cream. Cream cheese. Milk shakes. Beverages Coffee and tea, with or without caffeine. Carbonated beverages. Sodas. Energy drinks. Fruit juice made with acidic fruits (such as orange or grapefruit). Tomato juice. Alcoholic drinks. Fats and oils Butter. Margarine. Shortening. Ghee. Sweets and desserts Chocolate and cocoa. Donuts. Seasoning and other foods Pepper. Peppermint and spearmint. Any condiments, herbs, or seasonings that cause symptoms. For some people, this may include curry, hot sauce, or vinegar-based salad dressings. Summary  When you have gastroesophageal reflux disease (GERD), food and lifestyle choices are very important to help ease the  discomfort of GERD.  Eat frequent, small meals instead of three large meals each day. Eat your meals slowly, in a relaxed setting. Avoid bending over or lying down until 2-3 hours after eating.  Limit high-fat foods such as fatty meat or fried foods. This information is not intended to replace advice given to you by your health care provider. Make sure you discuss any questions you have with your health care provider. Document Revised: 10/01/2018 Document Reviewed: 06/11/2016 Elsevier Patient Education  [place].

## 2020-03-20 NOTE — Assessment & Plan Note (Addendum)
Patient has history of GERD, currently taking AcipHex twice daily and Pepcid daily as needed reporting esophageal burning for the last week.  Notably, patient fell in the shower 1 week ago and resumed taking Goody powders daily. She had previously experienced uncontrolled GERD and underwent EGD in April 2021 which revealed nonobstructed Schatzki's ring, small hiatal hernia, and abnormal gastric mucosa with chronic inactive gastritis, no H. pylori on biopsy.  She was taking Goody powders at that time which was suspected to explain her refractory upper GI symptoms.  She had been doing very well in regards to GERD since discontinuing Goody powders until this last week.  I again suspect refractory GERD symptoms are secondary to resuming Goody powders daily.  She may have developed mild esophagitis as well.  Plan: Stop Goody Powders and avoid all NSAIDs.  Resume Carafate up to 3 times daily before meals and ar bedtime.  Continue Aciphex 20 mg BID.  Continue Pepcid as needed.  Avoid fried, fatty, greasy, spicy, citrus foods.  Avoid caffeine and carbonated beverages. Follow-up in 3 months.

## 2020-03-20 NOTE — Assessment & Plan Note (Signed)
Chronic. Not adequately controlled. Previously failed Linzess and Amitiza. Reported 1 BM per week with Movantik. More recently tried on Trulance which she is taking once every other week. States Trulance works well when she takes it, she just didn't want to take the medication daily. No brbpr or melena. TCS up to date in January 2020 with no recommendations to repeat due to age.  I have advised she resume Trulance 3 mg daily and to let me know if this doesn't control constipation well. We will plan to follow-up in 3 months.

## 2020-03-22 ENCOUNTER — Telehealth: Payer: Self-pay | Admitting: Orthopedic Surgery

## 2020-03-22 NOTE — Telephone Encounter (Signed)
Patient called and states she fell and hurst all over and her back and neck more. Asked if she went to the ER.  She said no she wanted to see Dr. Aline Brochure.  I asked her if she went to her PCP. She said no, she called them and they can't get her in for a couple of weeks.  I advised her our first available  will be October 14 to be seen, but if she is hurting all over she should see her PCP / Urgent care or ER first.  She is going to wait for Dr. Aline Brochure

## 2020-03-22 NOTE — Telephone Encounter (Signed)
Ok thanks,  You do not have to send me these messages.

## 2020-03-29 DIAGNOSIS — G44309 Post-traumatic headache, unspecified, not intractable: Secondary | ICD-10-CM | POA: Diagnosis not present

## 2020-03-29 DIAGNOSIS — G894 Chronic pain syndrome: Secondary | ICD-10-CM | POA: Diagnosis not present

## 2020-03-29 DIAGNOSIS — S0990XA Unspecified injury of head, initial encounter: Secondary | ICD-10-CM | POA: Diagnosis not present

## 2020-03-29 DIAGNOSIS — M545 Low back pain, unspecified: Secondary | ICD-10-CM | POA: Diagnosis not present

## 2020-03-29 DIAGNOSIS — G43701 Chronic migraine without aura, not intractable, with status migrainosus: Secondary | ICD-10-CM | POA: Diagnosis not present

## 2020-03-30 ENCOUNTER — Ambulatory Visit (HOSPITAL_COMMUNITY)
Admission: RE | Admit: 2020-03-30 | Discharge: 2020-03-30 | Disposition: A | Discharge status: Home / Self Care | Payer: Medicare Other | Source: Ambulatory Visit | Attending: Internal Medicine | Admitting: Internal Medicine

## 2020-03-30 ENCOUNTER — Other Ambulatory Visit: Payer: Self-pay

## 2020-03-30 ENCOUNTER — Other Ambulatory Visit (HOSPITAL_COMMUNITY): Payer: Self-pay | Admitting: Internal Medicine

## 2020-03-30 DIAGNOSIS — Z043 Encounter for examination and observation following other accident: Secondary | ICD-10-CM | POA: Diagnosis not present

## 2020-03-30 DIAGNOSIS — M8580 Other specified disorders of bone density and structure, unspecified site: Secondary | ICD-10-CM | POA: Diagnosis not present

## 2020-03-30 DIAGNOSIS — M549 Dorsalgia, unspecified: Secondary | ICD-10-CM | POA: Diagnosis not present

## 2020-03-30 DIAGNOSIS — W19XXXA Unspecified fall, initial encounter: Secondary | ICD-10-CM

## 2020-03-30 DIAGNOSIS — R29890 Loss of height: Secondary | ICD-10-CM | POA: Insufficient documentation

## 2020-03-30 DIAGNOSIS — I700 Atherosclerosis of aorta: Secondary | ICD-10-CM | POA: Insufficient documentation

## 2020-03-30 DIAGNOSIS — M542 Cervicalgia: Secondary | ICD-10-CM | POA: Diagnosis not present

## 2020-03-30 DIAGNOSIS — M4316 Spondylolisthesis, lumbar region: Secondary | ICD-10-CM | POA: Diagnosis not present

## 2020-03-30 DIAGNOSIS — M47812 Spondylosis without myelopathy or radiculopathy, cervical region: Secondary | ICD-10-CM | POA: Diagnosis not present

## 2020-03-30 DIAGNOSIS — K5641 Fecal impaction: Secondary | ICD-10-CM | POA: Insufficient documentation

## 2020-03-30 DIAGNOSIS — M47816 Spondylosis without myelopathy or radiculopathy, lumbar region: Secondary | ICD-10-CM | POA: Insufficient documentation

## 2020-03-30 DIAGNOSIS — R7303 Prediabetes: Secondary | ICD-10-CM | POA: Diagnosis not present

## 2020-04-06 ENCOUNTER — Telehealth: Payer: Self-pay | Admitting: Orthopedic Surgery

## 2020-04-06 ENCOUNTER — Other Ambulatory Visit: Payer: Self-pay

## 2020-04-06 ENCOUNTER — Encounter: Payer: Self-pay | Admitting: Orthopedic Surgery

## 2020-04-06 ENCOUNTER — Other Ambulatory Visit (HOSPITAL_COMMUNITY): Payer: Self-pay | Admitting: Neurology

## 2020-04-06 ENCOUNTER — Ambulatory Visit (INDEPENDENT_AMBULATORY_CARE_PROVIDER_SITE_OTHER): Payer: Medicare Other | Admitting: Orthopedic Surgery

## 2020-04-06 VITALS — BP 142/87 | HR 82 | Ht 62.0 in | Wt 173.0 lb

## 2020-04-06 DIAGNOSIS — S36200A Unspecified injury of head of pancreas, initial encounter: Secondary | ICD-10-CM

## 2020-04-06 DIAGNOSIS — W19XXXA Unspecified fall, initial encounter: Secondary | ICD-10-CM

## 2020-04-06 DIAGNOSIS — S22000A Wedge compression fracture of unspecified thoracic vertebra, initial encounter for closed fracture: Secondary | ICD-10-CM | POA: Diagnosis not present

## 2020-04-06 DIAGNOSIS — S0990XA Unspecified injury of head, initial encounter: Secondary | ICD-10-CM

## 2020-04-06 MED ORDER — HYDROCODONE-ACETAMINOPHEN 5-325 MG PO TABS: 1.0000 | ORAL_TABLET | ORAL | 0 refills | Status: AC | PRN

## 2020-04-06 NOTE — Telephone Encounter (Signed)
Call received from [facility] at [facility], ph# [phone], relaying that patient had been given a 30-day prescription for pain medication by Dr Merlene Laughter. States patient is under pain contract with this provider. Please advise.

## 2020-04-06 NOTE — Patient Instructions (Signed)
Wear brace except in bed   Take 1 hydrocodone every 4 hrs as needed

## 2020-04-06 NOTE — Progress Notes (Signed)
BRIEONNA CRUTCHER  04/06/2020  Body mass index is 31.64 kg/m.  MEDICAL DECISION SECTION:  Encounter Diagnosis  Name Primary?  . Thoracic compression fracture, closed, initial encounter [facility]) Yes    Imaging Thoracic xrays : T8 fx  l spine xrays DDD  Plan:  (Rx., Inj., surg., Frx, MRI/CT, XR:2)  Brace x 6 weeks    Meds ordered this encounter  Medications  . HYDROcodone-acetaminophen (NORCO/VICODIN) 5-325 MG tablet    Sig: Take 1 tablet by mouth every 4 (four) hours as needed for up to 7 days for moderate pain.    Dispense:  42 tablet    Refill:  0   X-RAYS 6 WEEKS   HISTORY SECTION :  Chief Complaint  Patient presents with  . Fall    back pain/ upper and lower  after fall    HPI  74 YO FEMALE  3 falls c/o lower back pain and mid thoracic pain , some leg pain    Review of Systems  Eyes: Positive for blurred vision, double vision, photophobia and pain.  Cardiovascular: Positive for chest pain, palpitations, orthopnea, claudication and leg swelling.  Gastrointestinal: Positive for abdominal pain, blood in stool, constipation, diarrhea, heartburn, nausea and vomiting.  Genitourinary: Positive for dysuria, flank pain, frequency, hematuria and urgency.  Musculoskeletal: Positive for back pain and neck pain.  Skin: Positive for itching.  Neurological: Positive for dizziness, tingling, tremors, sensory change, speech change, focal weakness, seizures, weakness and headaches.  Endo/Heme/Allergies: Positive for environmental allergies and polydipsia. Bruises/bleeds easily.  Psychiatric/Behavioral: Positive for depression and suicidal ideas.  All other systems reviewed and are negative.    has a past medical history of Adenomatous colon polyp (2006), Anxiety, Anxiety and depression, Asthma, Breast mass, Breast tumor, Chest discomfort, Chronic back pain, Chronic constipation, Complication of anesthesia, Depression, Diverticulosis, Fibromyalgia, Gastroesophageal reflux  disease, Hiatal hernia, History of benign esophageal tumor (2010), Hyperlipidemia, Hypertension, Insomnia, Migraines, PONV (postoperative nausea and vomiting), Psychosis ([place]), Sleep apnea, Thyroid nodule, and Tumor of esophagus.   Past Surgical History:  Procedure Laterality Date  . ABDOMINAL HYSTERECTOMY    . BIOPSY  10/14/2019   Procedure: BIOPSY;  Surgeon: Daneil Dolin, MD;  Location: [facility];  Service: Endoscopy;;  . BRAVO [place] STUDY  12/11/2011   Procedure: BRAVO [place];  Surgeon: Daneil Dolin, MD;  Location: [facility];  Service: Endoscopy;  Laterality: N/A;  . 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 CHOLECYSTECTOMY N/A 04/09/2013   Procedure: LAPAROSCOPIC CHOLECYSTECTOMY;  Surgeon: Jamesetta So, MD;  Location: [facility] ORS;  Service: General;  Laterality: N/A;  . COLONOSCOPY  06/2008, 06/2011   sigmoid tics, tubular adenoma; 2013: anal canal hemorrhoids  . COLONOSCOPY  07/10/2011   Anal canal hemorrhoids likely the cause of hematochezia in the setting of constipation; otherwise normal rectum ;submucosal  petechiae in left colon of doubtful clinical significance; otherwise, normal colon  . COLONOSCOPY WITH PROPOFOL N/A 11/10/2017   cancelled in pre-op  . COLONOSCOPY WITH PROPOFOL N/A 07/23/2018   Procedure: COLONOSCOPY WITH PROPOFOL;  Surgeon: Daneil Dolin, MD; 1 tubular adenoma, diverticulosis in the sigmoid and descending colon, nonbleeding internal hemorrhoids.  No recommendations to repeat due to age.  . ESOPHAGEAL DILATION N/A 05/08/2015   Procedure: ESOPHAGEAL DILATION;  Surgeon: Daneil Dolin, MD;  Location: [facility];  Service: Endoscopy;  Laterality: N/AVenia Minks 54/56  . ESOPHAGOGASTRODUODENOSCOPY  12/11/2011   VFI:EPPIRJJOACZ Schatzki's ring; otherwise normal/Small hiatal hernia.  Antral and bulbar erosions  . ESOPHAGOGASTRODUODENOSCOPY (EGD) WITH PROPOFOL N/A 05/08/2015   Dr. Gala Romney: mild erosive reflux esophagitis,  non-critical Schatzki's ring s/p dilation. Hiatal hernia.   Marland Kitchen ESOPHAGOGASTRODUODENOSCOPY (EGD) WITH PROPOFOL N/A 10/14/2019   Procedure: ESOPHAGOGASTRODUODENOSCOPY (EGD) WITH PROPOFOL;  Surgeon: Daneil Dolin, MD;  Nonobstructing Schatzki ring, small hiatal hernia, abnormal gastric mucosa s/p biopsied, normal first and second portion of the duodenum.  Pathology with chronic inactive gastritis, no H. pylori.  . EUS  08/2010   Dr [facility] with EGD. Retained food. No recurrent esophageal lesion, bx negative.  Marland Kitchen LUNG BIOPSY     negative  . PARATHYROIDECTOMY  April 2017   [facility].   Marland Kitchen PARATHYROIDECTOMY    . POLYPECTOMY  07/23/2018   Procedure: POLYPECTOMY;  Surgeon: Daneil Dolin, MD;  [facility];  Service: Endoscopy;;  colon  . RIGHT OOPHORECTOMY     benign disease  . SHOULDER ARTHROSCOPY     Right; bone spurs removed  . TONSILLECTOMY    . TOTAL KNEE ARTHROPLASTY  2002   Right; previous arthroscopic surgery    Social History   Tobacco Use  . Smoking status: Never Smoker  . Smokeless tobacco: Never Used  Vaping Use  . Vaping Use: Never used  Substance Use Topics  . Alcohol use: No    Alcohol/week: 0.0 standard drinks  . Drug use: No   Allergies  Allergen Reactions  . Elavil [Amitriptyline] Other (See Comments)    Felt really nervous and felt like something was hold her feet or arm and/ or AM headache on it and back on it and went away off it.   . Abilify [Aripiprazole] Other (See Comments)    Dystonic reaction  . Codeine Hives, Nausea Only and Other (See Comments)    Feels funny  . Latex Hives  . Penicillins Hives, Itching and Other (See Comments)    Has patient had a PCN reaction causing immediate rash, facial/tongue/throat swelling, SOB or lightheadedness with hypotension: Yes Has patient had a PCN reaction causing severe rash involving mucus membranes or skin necrosis: No Has patient had a PCN reaction that required hospitalization No Has patient had a PCN  reaction occurring within the last 10 years: No If all of the above answers are "NO", then may proceed with Cephalosporin use.   . Polyethylene Glycol Other (See Comments)    Per allergy test  . Sulfonamide Derivatives Nausea And Vomiting  . Cymbalta [Duloxetine Hcl] Rash  . Remeron [Mirtazapine] Other (See Comments)    Caused lots of strange, weird, crazy dreams.     Current Outpatient Medications:  .  albuterol (PROVENTIL HFA;VENTOLIN HFA) 108 (90 BASE) MCG/ACT inhaler, Inhale 2 puffs into the lungs every 6 (six) hours as needed for wheezing or shortness of breath. , Disp: , Rfl:  .  amLODipine (NORVASC) 10 MG tablet, Take 10 mg by mouth daily. , Disp: , Rfl:  .  atenolol (TENORMIN) 100 MG tablet, Take 1 tablet (100 mg total) by mouth daily., Disp: 90 tablet, Rfl: 3 .  atenolol (TENORMIN) 25 MG tablet, Take 25 mg daily in addition to 100 mg daily for total dose of 125 mg daily, Disp: 90 tablet, Rfl: 3 .  CALCIUM-VITAMIN D PO, Take 1 tablet by mouth daily., Disp: , Rfl:  .  chlorthalidone (HYGROTON) 25 MG tablet, Take 1/2 tablet by mouth daily., Disp: 45 tablet, Rfl: 2 .  cycloSPORINE (RESTASIS) 0.05 % ophthalmic emulsion, Place 1 drop into both eyes 2 (two)  times daily., Disp: , Rfl:  .  Erenumab-aooe (AIMOVIG [place]), Inject into the skin every 30 (thirty) days. , Disp: , Rfl:  .  famotidine (PEPCID) 20 MG tablet, Take 20 mg by mouth daily., Disp: , Rfl:  .  fluticasone (FLONASE) 50 MCG/ACT nasal spray, Place 1 spray into both nostrils daily as needed for allergies. , Disp: , Rfl:  .  furosemide (LASIX) 40 MG tablet, Take 40 mg daily as needed for swelling., Disp: 90 tablet, Rfl: 3 .  gabapentin (NEURONTIN) 300 MG capsule, Take 300 mg by mouth at bedtime. , Disp: , Rfl:  .  lovastatin (MEVACOR) 40 MG tablet, Take 40 mg by mouth at bedtime., Disp: , Rfl:  .  Melatonin 3 MG TABS, Take 9 mg by mouth at bedtime. , Disp: , Rfl:  .  memantine (NAMENDA) 10 MG tablet, Take 10 mg by mouth 2 (two)  times daily. , Disp: , Rfl:  .  neomycin-bacitracin-polymyxin (NEOSPORIN) [phone] ointment, Apply 1 application topically as needed. , Disp: , Rfl:  .  OLANZapine (ZYPREXA) 7.5 MG tablet, Take 7.5 mg by mouth at bedtime., Disp: , Rfl:  .  potassium chloride SA (KLOR-CON M20) 20 MEQ tablet, Take 40 meq in the AM & 20 meq in the PM., Disp: 270 tablet, Rfl: 3 .  RABEprazole (ACIPHEX) 20 MG tablet, Take 1 tablet by mouth daily before breakfast., Disp: 30 tablet, Rfl: 10 .  Semaglutide (RYBELSUS) 7 MG TABS, Take by mouth., Disp: , Rfl:  .  spironolactone (ALDACTONE) 25 MG tablet, Take 25 mg by mouth daily., Disp: , Rfl:  .  sucralfate (CARAFATE) 1 GM/10ML suspension, Take 10 mLs (1 g total) by mouth 4 (four) times daily -  with meals and at bedtime., Disp: 420 mL, Rfl: 1 .  topiramate (TOPAMAX) 100 MG tablet, Take 100 mg by mouth at bedtime. , Disp: , Rfl:  .  traZODone (DESYREL) 100 MG tablet, Take 300 mg by mouth at bedtime. , Disp: , Rfl: 3 .  venlafaxine XR (EFFEXOR-XR) 150 MG 24 hr capsule, Take 150 mg by mouth at bedtime. , Disp: , Rfl:  .  venlafaxine XR (EFFEXOR-XR) 75 MG 24 hr capsule, Take 75 mg by mouth at bedtime. , Disp: , Rfl:  .  HYDROcodone-acetaminophen (NORCO/VICODIN) 5-325 MG tablet, Take 1 tablet by mouth every 4 (four) hours as needed for up to 7 days for moderate pain., Disp: 42 tablet, Rfl: 0   PHYSICAL EXAM SECTION: BP (!) 142/87   Pulse 82   Ht 5\' 2"  (1.575 m)   Wt 173 lb (78.5 kg)   BMI 31.64 kg/m   Body mass index is 31.64 kg/m.   General appearance: Well-developed well-nourished no gross deformities   Neck is supple without palpable mass, full range of motion  Cardiovascular normal pulse and perfusion in all 4 extremities normal color without edema  Neurologically deep tendon reflexes are equal and normal, no sensation loss or deficits no pathologic reflexes  Psychological: Awake alert and oriented x3 mood normal affect flat  Skin no lacerations or  ulcerations no nodularity no palpable masses, no erythema or nodularity  Musculoskeletal:   LUMBAR SPINE: TENDER LOWER MIDLINE AND MID THORACIC    10:39 AM

## 2020-04-07 DIAGNOSIS — S22068A Other fracture of T7-T8 thoracic vertebra, initial encounter for closed fracture: Secondary | ICD-10-CM | POA: Diagnosis not present

## 2020-04-17 ENCOUNTER — Ambulatory Visit (HOSPITAL_COMMUNITY)
Admission: RE | Admit: 2020-04-17 | Discharge: 2020-04-17 | Disposition: A | Discharge status: Home / Self Care | Payer: Medicare Other | Source: Ambulatory Visit | Attending: Neurology | Admitting: Neurology

## 2020-04-17 ENCOUNTER — Other Ambulatory Visit: Payer: Self-pay

## 2020-04-17 ENCOUNTER — Ambulatory Visit (HOSPITAL_COMMUNITY): Payer: Medicare Other

## 2020-04-17 DIAGNOSIS — S36200A Unspecified injury of head of pancreas, initial encounter: Secondary | ICD-10-CM | POA: Diagnosis not present

## 2020-04-17 DIAGNOSIS — Z043 Encounter for examination and observation following other accident: Secondary | ICD-10-CM | POA: Diagnosis not present

## 2020-04-17 DIAGNOSIS — S0990XA Unspecified injury of head, initial encounter: Secondary | ICD-10-CM | POA: Insufficient documentation

## 2020-04-26 DIAGNOSIS — G43701 Chronic migraine without aura, not intractable, with status migrainosus: Secondary | ICD-10-CM | POA: Diagnosis not present

## 2020-05-22 ENCOUNTER — Encounter: Payer: Self-pay | Admitting: Orthopedic Surgery

## 2020-05-22 ENCOUNTER — Other Ambulatory Visit: Payer: Self-pay

## 2020-05-22 ENCOUNTER — Ambulatory Visit (INDEPENDENT_AMBULATORY_CARE_PROVIDER_SITE_OTHER): Payer: Medicare Other | Admitting: Orthopedic Surgery

## 2020-05-22 ENCOUNTER — Ambulatory Visit: Payer: Medicare Other

## 2020-05-22 DIAGNOSIS — M25512 Pain in left shoulder: Secondary | ICD-10-CM | POA: Diagnosis not present

## 2020-05-22 DIAGNOSIS — S22000G Wedge compression fracture of unspecified thoracic vertebra, subsequent encounter for fracture with delayed healing: Secondary | ICD-10-CM

## 2020-05-22 DIAGNOSIS — G8929 Other chronic pain: Secondary | ICD-10-CM

## 2020-05-22 NOTE — Progress Notes (Signed)
Chief Complaint  Patient presents with  . Back Injury    fracture thoracic spine T8 had 3 falls October ? date of injury    Encounter Diagnoses  Name Primary?  . Closed compression fracture of thoracic vertebra with delayed healing, subsequent encounter Yes  . Chronic left shoulder pain     Procedure note the subacromial injection shoulder left   Verbal consent was obtained to inject the  Left   Shoulder  Timeout was completed to confirm the injection site is a subacromial space of the  left  shoulder  Medication used Depo-Medrol 40 mg and lidocaine 1% 3 cc  Anesthesia was provided by ethyl chloride  The injection was performed in the left  posterior subacromial space. After pinning the skin with alcohol and anesthetized the skin with ethyl chloride the subacromial space was injected using a 20-gauge needle. There were no complications  Sterile dressing was applied.  Encounter Diagnoses  Name Primary?  . Closed compression fracture of thoracic vertebra with delayed healing, subsequent encounter Yes  . Chronic left shoulder pain     Note patient requested injection left shoulder said she got good relief from the last one so this was added on  As far as the back goes still having pain offered her possible kyphoplasty she declined she is in a brace she will continue to wear that if it is helping otherwise she will to discontinue that  I do not think she can get good complete pain relief because of the severe kyphosis  She is in chronic pain management locally and she continue with that for pain meds

## 2020-05-25 ENCOUNTER — Other Ambulatory Visit: Payer: Self-pay

## 2020-05-25 ENCOUNTER — Encounter: Payer: Self-pay | Admitting: Orthopedic Surgery

## 2020-05-25 ENCOUNTER — Ambulatory Visit (INDEPENDENT_AMBULATORY_CARE_PROVIDER_SITE_OTHER): Payer: Medicare Other | Admitting: Orthopedic Surgery

## 2020-05-25 VITALS — BP 133/77 | HR 69 | Ht 62.0 in | Wt 173.0 lb

## 2020-05-25 DIAGNOSIS — M1712 Unilateral primary osteoarthritis, left knee: Secondary | ICD-10-CM | POA: Diagnosis not present

## 2020-05-25 NOTE — Progress Notes (Signed)
Chief Complaint  Patient presents with  . Knee Pain    left    Requests injection left knee   Procedure note left knee injection   verbal consent was obtained to inject left knee joint  Timeout was completed to confirm the site of injection  The medications used were 40 mg of Depo-Medrol and 1% lidocaine 3 cc  Anesthesia was provided by ethyl chloride and the skin was prepped with alcohol.  After cleaning the skin with alcohol a 20-gauge needle was used to inject the left knee joint. There were no complications. A sterile bandage was applied.  Encounter Diagnosis  Name Primary?  . Primary osteoarthritis of left knee Yes

## 2020-05-25 NOTE — Patient Instructions (Signed)

## 2020-05-30 DIAGNOSIS — G43701 Chronic migraine without aura, not intractable, with status migrainosus: Secondary | ICD-10-CM | POA: Diagnosis not present

## 2020-05-30 DIAGNOSIS — M545 Low back pain, unspecified: Secondary | ICD-10-CM | POA: Diagnosis not present

## 2020-05-30 DIAGNOSIS — G894 Chronic pain syndrome: Secondary | ICD-10-CM | POA: Diagnosis not present

## 2020-05-30 DIAGNOSIS — Z79899 Other long term (current) drug therapy: Secondary | ICD-10-CM | POA: Diagnosis not present

## 2020-05-30 DIAGNOSIS — G3184 Mild cognitive impairment, so stated: Secondary | ICD-10-CM | POA: Diagnosis not present

## 2020-05-31 ENCOUNTER — Telehealth: Payer: Self-pay | Admitting: Orthopedic Surgery

## 2020-05-31 DIAGNOSIS — S22000A Wedge compression fracture of unspecified thoracic vertebra, initial encounter for closed fracture: Secondary | ICD-10-CM

## 2020-05-31 NOTE — Telephone Encounter (Signed)
Patient is wanting to know if we have contacted the surgeon office for her.  She states no one has called her about it.    Please call her back at [phone]

## 2020-06-01 NOTE — Telephone Encounter (Signed)
Ok to order 

## 2020-06-01 NOTE — Telephone Encounter (Signed)
MRI scheduled for 06/13/20 and patient is aware  Dr Estanislado Pandy needs MRI before he can do the consult for the kyphoplasy/ vertebroplasty procedure to make sure there is no retropulsion of bone in the canal.   To you FYI

## 2020-06-01 NOTE — Telephone Encounter (Signed)
Left message for her to call back need to ask her about any metal implants for the MRI scan and let her know this has to be done before the kyphoplasty consult

## 2020-06-01 NOTE — Telephone Encounter (Signed)
The referral is for interventional radiology, Dr Estanislado Pandy have put in system she needs MRI first, I will call her to advise. MRI ordered.

## 2020-06-08 DIAGNOSIS — E876 Hypokalemia: Secondary | ICD-10-CM | POA: Diagnosis not present

## 2020-06-08 DIAGNOSIS — I10 Essential (primary) hypertension: Secondary | ICD-10-CM | POA: Diagnosis not present

## 2020-06-08 DIAGNOSIS — G43009 Migraine without aura, not intractable, without status migrainosus: Secondary | ICD-10-CM | POA: Diagnosis not present

## 2020-06-08 DIAGNOSIS — J4530 Mild persistent asthma, uncomplicated: Secondary | ICD-10-CM | POA: Diagnosis not present

## 2020-06-08 DIAGNOSIS — N1831 Chronic kidney disease, stage 3a: Secondary | ICD-10-CM | POA: Diagnosis not present

## 2020-06-08 DIAGNOSIS — R7303 Prediabetes: Secondary | ICD-10-CM | POA: Diagnosis not present

## 2020-06-08 DIAGNOSIS — J302 Other seasonal allergic rhinitis: Secondary | ICD-10-CM | POA: Diagnosis not present

## 2020-06-13 ENCOUNTER — Ambulatory Visit (HOSPITAL_COMMUNITY)
Admission: RE | Admit: 2020-06-13 | Discharge: 2020-06-13 | Disposition: A | Discharge status: Home / Self Care | Payer: Medicare Other | Source: Ambulatory Visit | Attending: Orthopedic Surgery | Admitting: Orthopedic Surgery

## 2020-06-13 ENCOUNTER — Other Ambulatory Visit: Payer: Self-pay

## 2020-06-13 DIAGNOSIS — E876 Hypokalemia: Secondary | ICD-10-CM | POA: Diagnosis not present

## 2020-06-13 DIAGNOSIS — I10 Essential (primary) hypertension: Secondary | ICD-10-CM | POA: Diagnosis not present

## 2020-06-13 DIAGNOSIS — S22060A Wedge compression fracture of T7-T8 vertebra, initial encounter for closed fracture: Secondary | ICD-10-CM | POA: Diagnosis not present

## 2020-06-13 DIAGNOSIS — M4804 Spinal stenosis, thoracic region: Secondary | ICD-10-CM | POA: Diagnosis not present

## 2020-06-13 DIAGNOSIS — M40204 Unspecified kyphosis, thoracic region: Secondary | ICD-10-CM | POA: Diagnosis not present

## 2020-06-13 DIAGNOSIS — J302 Other seasonal allergic rhinitis: Secondary | ICD-10-CM | POA: Diagnosis not present

## 2020-06-13 DIAGNOSIS — G43009 Migraine without aura, not intractable, without status migrainosus: Secondary | ICD-10-CM | POA: Diagnosis not present

## 2020-06-13 DIAGNOSIS — S22000A Wedge compression fracture of unspecified thoracic vertebra, initial encounter for closed fracture: Secondary | ICD-10-CM | POA: Diagnosis not present

## 2020-06-13 DIAGNOSIS — M5124 Other intervertebral disc displacement, thoracic region: Secondary | ICD-10-CM | POA: Diagnosis not present

## 2020-06-13 DIAGNOSIS — E785 Hyperlipidemia, unspecified: Secondary | ICD-10-CM | POA: Diagnosis not present

## 2020-06-13 DIAGNOSIS — J4530 Mild persistent asthma, uncomplicated: Secondary | ICD-10-CM | POA: Diagnosis not present

## 2020-06-19 NOTE — Progress Notes (Signed)
Referring Provider: Benita Stabile, MD Primary Care Physician:  Benita Stabile, MD Primary GI Physician: Dr. Jena Gauss  Chief Complaint  Patient presents with  . Gastroesophageal Reflux  . Constipation    HPI:   [name] is a 74 y.o. female with a history of  GERD, esophageal granular cell tumor s/p EMR in 2011 by Dr. Margaretha Glassing with [facility]. Has also seen Dr. Margaretha Glassing for uncontrolled GERDin 2014 s/p EGDs, normal GES, and a variety of PPIs. Manometry had been attempted but she was unable to tolerate the probe. EGD in April 2021 with Dr. Jena Gauss due to uncontrolled GERD/odynophagia revealing nonobstructing Schatzki's ring, small hiatal hernia, abnormal gastric mucosa (chronic inactive gastritis, no H. pylori on biopsy), normal examined duodenum.  Suspected daily Goody powders likely explain refractory upper GI symptoms.  Also withchronic constipation likely opioid induced, andadenomatous colon polyps with last colonoscopy in January 2020 and no recommendations to repeat due to age.She is presenting today for follow-up of GERD and constipation.   Last seen in our office 03/20/2020.  She reported falling in the shower 1 week prior to office visit and was having back and neck pain.  Had not followed up with PCP.  She was taking Goody powders and Tylenol daily in addition to [employer] which had been started a few months prior.  Noted her GI symptoms have been much improved prior to her fall and resuming Goody powders daily.  She was now having esophageal burning daily.  Continued AcipHex twice daily but had discontinued Carafate.  Bloating had resolved with limiting fried/fatty foods, soda, beans, and adding align.  She is taking Trulance once every other week due to personal preference of not taking it daily.  BMs were every 3-4 days with straining at times.  Advise she stop Goody powders and avoid all NSAIDs, resume Carafate up to 3 times daily before meals and at bedtime, continue AcipHex twice daily, continue  Pepcid as needed, continue align, and resume Trulance daily.  Follow-up in 3 months.  Today:   GERD: Taking Aciphex twice a day. Not taking pepcid or carate. Breakthrough symptoms not very often. States when it occurs, it is bad. She will take baking soda. This helps sometimes. This occurs maybe once every 2-3 weeks.  Rare soda.  Avoiding fried, fatty, greasy, spicy, citrus foods.  States she is always getting "choked". Feels foods want to go down the wrong way. Also feels foods will go down and she doesn't mean for them to. No foods getting hung in her esophagus.   Constipation: BMs every 3-4 days with occasional straining. Taking Trulance 2-3 times a week. Tried taking Trulance daily but she was having urgency and inability to get to the bathroom in time. No blood in the stool or black stool.   Since she fell, her stomach feels empty. Eating well. No abdominal pain. Occasional nausea but vomiting. Taking 1 Goody powder daily. No other NSAIDs.    Past Medical History:  Diagnosis Date  . Adenomatous colon polyp 2006   excised in 2006 & 2010Due surveillance 06/2013  . Anxiety   . Anxiety and depression   . Asthma   . Breast mass    right nipple bengn mass per patient  . Breast tumor   . Chest discomfort   . Chronic back pain   . Chronic constipation   . Complication of anesthesia   . Depression   . Diverticulosis   . Fibromyalgia   . Gastroesophageal reflux disease   .  Hiatal hernia   . History of benign esophageal tumor 2010   granular cell esophageal tumor (Dx 06/2008), resected via EMR 2011, due repeat EGD 02/2012  . Hyperlipidemia   . Hypertension   . Insomnia   . Migraines   . PONV (postoperative nausea and vomiting)   . Psychosis ([place])   . Sleep apnea    Stop Bang score of 5. Pt said she was told by Dr. Merlene Laughter that she had "a little bit" of sleep apena, but not bad enough to treat.  . Thyroid nodule   . Tumor of esophagus     Past Surgical History:  Procedure  Laterality Date  . ABDOMINAL HYSTERECTOMY    . BIOPSY  10/14/2019   Procedure: BIOPSY;  Surgeon: Daneil Dolin, MD;  Location: [facility];  Service: Endoscopy;;  . BRAVO [place] STUDY  12/11/2011   Procedure: BRAVO [place];  Surgeon: Daneil Dolin, MD;  Location: [facility];  Service: Endoscopy;  Laterality: N/A;  . 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 CHOLECYSTECTOMY N/A 04/09/2013   Procedure: LAPAROSCOPIC CHOLECYSTECTOMY;  Surgeon: Jamesetta So, MD;  Location: [facility] OR[facility];  Service: General;  Laterality: N/A;  . COLONOSCOPY  06/2008, 06/2011   sigmoid tics, tubular adenoma; 2013: anal canal hemorrhoids  . COLONOSCOPY  07/10/2011   Anal canal hemorrhoids likely the cause of hematochezia in the setting of constipation; otherwise normal rectum ;submucosal  petechiae in left colon of doubtful clinical significance; otherwise, normal colon  . COLONOSCOPY WITH PROPOFOL N/A 11/10/2017   cancelled in pre-op  . COLONOSCOPY WITH PROPOFOL N/A 07/23/2018   Procedure: COLONOSCOPY WITH PROPOFOL;  Surgeon: Daneil Dolin, MD; 1 tubular adenoma, diverticulosis in the sigmoid and descending colon, nonbleeding internal hemorrhoids.  No recommendations to repeat due to age.  . ESOPHAGEAL DILATION N/A 05/08/2015   Procedure: ESOPHAGEAL DILATION;  Surgeon: Daneil Dolin, MD;  Location: [facility];  Service: Endoscopy;  Laterality: N/AVenia Minks 54/56  . ESOPHAGOGASTRODUODENOSCOPY  12/11/2011   [id number] Schatzki's ring; otherwise normal/Small hiatal hernia. Antral and bulbar erosions  . ESOPHAGOGASTRODUODENOSCOPY (EGD) WITH PROPOFOL N/A 05/08/2015   Dr. Gala Romney: mild erosive reflux esophagitis, non-critical Schatzki's ring s/p dilation. Hiatal hernia.   Marland Kitchen ESOPHAGOGASTRODUODENOSCOPY (EGD) WITH PROPOFOL N/A 10/14/2019   Procedure: ESOPHAGOGASTRODUODENOSCOPY (EGD) WITH PROPOFOL;  Surgeon: Daneil Dolin, MD;  Nonobstructing Schatzki ring, small hiatal hernia, abnormal  gastric mucosa s/p biopsied, normal first and second portion of the duodenum.  Pathology with chronic inactive gastritis, no H. pylori.  . EUS  08/2010   Dr [facility] with EGD. Retained food. No recurrent esophageal lesion, bx negative.  Marland Kitchen LUNG BIOPSY     negative  . PARATHYROIDECTOMY  April 2017   [facility].   Marland Kitchen PARATHYROIDECTOMY    . POLYPECTOMY  07/23/2018   Procedure: POLYPECTOMY;  Surgeon: Daneil Dolin, MD;  [facility];  Service: Endoscopy;;  colon  . RIGHT OOPHORECTOMY     benign disease  . SHOULDER ARTHROSCOPY     Right; bone spurs removed  . TONSILLECTOMY    . TOTAL KNEE ARTHROPLASTY  2002   Right; previous arthroscopic surgery    Current Outpatient Medications  Medication Sig Dispense Refill  . AIMOVIG 70 MG/ML SOAJ Inject 1 Syringe into the skin every 30 (thirty) days.    Marland Kitchen albuterol (PROVENTIL HFA;VENTOLIN HFA) 108 (90 BASE) MCG/ACT inhaler Inhale 2 puffs into the lungs every 6 (six) hours as needed for  wheezing or shortness of breath.     Marland Kitchen. amLODipine (NORVASC) 10 MG tablet Take 10 mg by mouth daily.    Marland Kitchen. atenolol (TENORMIN) 100 MG tablet Take 1 tablet (100 mg total) by mouth daily. 90 tablet 3  . atenolol (TENORMIN) 25 MG tablet Take 25 mg daily in addition to 100 mg daily for total dose of 125 mg daily 90 tablet 3  . CALCIUM-VITAMIN D PO Take 1 tablet by mouth daily.    . chlorthalidone (HYGROTON) 25 MG tablet Take 1/2 tablet by mouth daily. 45 tablet 2  . cycloSPORINE (RESTASIS) 0.05 % ophthalmic emulsion Place 1 drop into both eyes 2 (two) times daily.    . famotidine (PEPCID) 20 MG tablet Take 1 tablet (20 mg total) by mouth as needed for heartburn or indigestion. 60 tablet 3  . fluticasone (FLONASE) 50 MCG/ACT nasal spray Place 1 spray into both nostrils daily as needed for allergies.     . furosemide (LASIX) 40 MG tablet Take 40 mg daily as needed for swelling. 90 tablet 3  . gabapentin (NEURONTIN) 300 MG capsule Take 300 mg by mouth at bedtime.     Marland Kitchen.  HYDROcodone-acetaminophen (NORCO/VICODIN) 5-325 MG tablet hydrocodone 5 mg-acetaminophen 325 mg tablet  Take 1 tablet every day by oral route as needed.    . INGREZZA 80 MG CAPS Take 1 capsule by mouth daily.    Marland Kitchen. lovastatin (MEVACOR) 40 MG tablet Take 40 mg by mouth at bedtime.    . Melatonin 3 MG TABS Take 9 mg by mouth at bedtime.     . memantine (NAMENDA) 10 MG tablet Take 10 mg by mouth 2 (two) times daily.     Marland Kitchen. neomycin-bacitracin-polymyxin (NEOSPORIN) [phone] ointment Apply 1 application topically as needed.     Marland Kitchen. OLANZapine (ZYPREXA) 7.5 MG tablet Take 7.5 mg by mouth at bedtime.    Marland Kitchen. Plecanatide (TRULANCE) 3 MG TABS Take 1 tablet by mouth as needed.    . potassium chloride SA (KLOR-CON M20) 20 MEQ tablet Take 40 meq in the AM & 20 meq in the PM. (Patient taking differently: Take 20 mEq by mouth daily.) 270 tablet 3  . RABEprazole (ACIPHEX) 20 MG tablet Take 1 tablet by mouth daily before breakfast. (Patient taking differently: 20 mg 2 (two) times daily.) 30 tablet 10  . Semaglutide (RYBELSUS) 7 MG TABS Take by mouth.    . spironolactone (ALDACTONE) 25 MG tablet Take 25 mg by mouth daily.    . traZODone (DESYREL) 100 MG tablet Take 300 mg by mouth at bedtime.   3  . venlafaxine XR (EFFEXOR-XR) 150 MG 24 hr capsule Take 150 mg by mouth at bedtime.     Marland Kitchen. venlafaxine XR (EFFEXOR-XR) 75 MG 24 hr capsule Take 75 mg by mouth at bedtime.     Marland Kitchen. zolpidem (AMBIEN) 5 MG tablet zolpidem 5 mg tablet  Take 2 tablets every day by oral route.  1-2 q hs; discontinue trazodone     No current facility-administered medications for this visit.    Allergies as of 06/21/2020 - Review Complete 06/21/2020  Allergen Reaction Noted  . Elavil [amitriptyline] Other (See Comments) 06/03/2012  . Abilify [aripiprazole] Other (See Comments) 11/25/2012  . Codeine Hives, Nausea Only, and Other (See Comments)   . Latex Hives 11/21/2011  . Penicillins Hives, Itching, and Other (See Comments)   . Polyethylene  glycol Other (See Comments) 12/26/2012  . Sulfonamide derivatives Nausea And Vomiting   . Cymbalta [duloxetine hcl] Rash  03/10/2013  . Remeron [mirtazapine] Other (See Comments) 05/06/2012    Family History  Problem Relation Age of Onset  . Stroke Father   . Alcohol abuse Father   . Heart attack Mother   . Depression Mother   . Anxiety disorder Mother   . Colon cancer Paternal Aunt   . Colon cancer Paternal Uncle   . Dementia Maternal Uncle   . ADD / ADHD Neg Hx   . Bipolar disorder Neg Hx   . Drug abuse Neg Hx   . OCD Neg Hx   . Paranoid behavior Neg Hx   . Schizophrenia Neg Hx   . Seizures Neg Hx   . Sexual abuse Neg Hx   . Physical abuse Neg Hx     Social History   Socioeconomic History  . Marital status: Single    Spouse name: Not on file  . Number of children: 1  . Years of education: Not on file  . Highest education level: Not on file  Occupational History  . Occupation: disabled    Employer: RETIRED  Tobacco Use  . Smoking status: Never Smoker  . Smokeless tobacco: Never Used  Vaping Use  . Vaping Use: Never used  Substance and Sexual Activity  . Alcohol use: No    Alcohol/week: 0.0 standard drinks  . Drug use: No  . Sexual activity: Never  Other Topics Concern  . Not on file  Social History Narrative  . Not on file   Social Determinants of Health   Financial Resource Strain: Not on file  Food Insecurity: Not on file  Transportation Needs: Not on file  Physical Activity: Not on file  Stress: Not on file  Social Connections: Not on file    Review of Systems: Gen: Denies fever, chills, cold or flulike symptoms.  Intermittent lightheadedness with position changes. GI: See HPI Heme: See HPI  Physical Exam: BP 131/80   Pulse 77   Temp (!) 97.1 F (36.2 C) (Temporal)   Ht 5\' 2"  (1.575 m)   Wt 178 lb 12.8 oz (81.1 kg)   BMI 32.70 kg/m  General:   Alert and oriented. No distress noted. Pleasant and cooperative.  Head:  Normocephalic and  atraumatic. Eyes:  Conjuctiva clear without scleral icterus. Heart:  S1, S2 present without murmurs appreciated. Lungs:  Clear to auscultation bilaterally. No wheezes, rales, or rhonchi. No distress.  Abdomen:  +BS, soft, non-tender and non-distended. No rebound or guarding. No HSM or masses noted. Msk:  Symmetrical without gross deformities. Normal posture. Extremities:  Without edema. Neurologic:  Alert and  oriented x4 Psych: Normal mood and affect.

## 2020-06-21 ENCOUNTER — Other Ambulatory Visit: Payer: Self-pay

## 2020-06-21 ENCOUNTER — Encounter: Payer: Self-pay | Admitting: Gastroenterology

## 2020-06-21 ENCOUNTER — Other Ambulatory Visit: Payer: Self-pay | Admitting: *Deleted

## 2020-06-21 ENCOUNTER — Ambulatory Visit (INDEPENDENT_AMBULATORY_CARE_PROVIDER_SITE_OTHER): Payer: Medicare Other | Admitting: Gastroenterology

## 2020-06-21 VITALS — BP 131/80 | HR 77 | Temp 97.1°F | Ht 62.0 in | Wt 178.8 lb

## 2020-06-21 DIAGNOSIS — K219 Gastro-esophageal reflux disease without esophagitis: Secondary | ICD-10-CM

## 2020-06-21 DIAGNOSIS — R1310 Dysphagia, unspecified: Secondary | ICD-10-CM | POA: Diagnosis not present

## 2020-06-21 DIAGNOSIS — K5909 Other constipation: Secondary | ICD-10-CM | POA: Diagnosis not present

## 2020-06-21 MED ORDER — FAMOTIDINE 20 MG PO TABS: 20.0000 mg | ORAL_TABLET | ORAL | 3 refills | Status: DC | PRN

## 2020-06-21 NOTE — Progress Notes (Signed)
CC'ED TO PCP 

## 2020-06-21 NOTE — Patient Instructions (Signed)
We will refer you to speech pathologist to have your trouble swallowing evaluated further.   Please stop using Goody Powders and avoid all NSAID products.  You may use Tylenol as needed. No more than 2000 mg tylenol daily. Your Norco contains 325mg  tylenol per pill.   Continue taking AcipHex twice daily 30 minutes before breakfast and dinner.  Use Pepcid 20 mg daily to twice daily as needed for breakthrough symptoms.  Follow a GERD diet:  Avoid fried, fatty, greasy, spicy, citrus foods. Avoid caffeine and carbonated beverages. Avoid chocolate. Try eating 4-6 small meals a day rather than 3 large meals. Do not eat within 3 hours of laying down. Prop head of bed up on wood or bricks to create a 6 inch incline.  Continue Trulance daily as needed.  Start MiraLAX 1 capful (17 g) daily in 8 hours as the water.   We will see you back in 6 months. Call with questions or concerns prior.   , PA-C [facility]

## 2020-06-21 NOTE — Assessment & Plan Note (Addendum)
Chronic.  Fairly well controlled with AcipHex 20 mg twice daily.  Occasional breakthrough symptoms, once every 2-3 weeks, for which she is taking baking soda.  No identified triggers of flares.  Avoiding typical reflux triggers.  She does occasionally drink soda.  Also taking 1 Goody powders daily.  Plan: Continue AcipHex 20 mg twice daily. Stop baking soda and use Pepcid 20 mg up to twice daily as needed for breakthrough GERD symptoms. Counseled on GERD diet/lifestyle.  Printed information was provided. Discontinue Goody powders and avoid all NSAID products. Follow-up in 6 months or sooner if needed.

## 2020-06-21 NOTE — Assessment & Plan Note (Addendum)
Chronic history of constipation likely influenced by chronic pain medication.  Previously failed Linzess, Amitiza, and Movantik.  More recently, she has been on Trulance, but she is taking this a few times a week as she notes significant urgency with bowel movements if taking Trulance daily. With this regimen, she is having BMs every 3-4 days. Occasional straining. No alarm symptoms.  Colonoscopy up-to-date in January 2020 with no recommendations to repeat due to age.  Recommend she start MiraLAX 1 capful (17 g) daily in ounces of water. Continue to take Trulance 3 mg daily as needed. Follow-up in 6 months or sooner if needed.

## 2020-06-21 NOTE — Assessment & Plan Note (Signed)
Patient is reporting sensation of choking/foods going down the wrong way along with items going down her esophagus before she is swallowing. Denies sensation of food items getting hung in her esophagus. Symptoms sound oropharyngeal. Will refer to SLP for evaluation.

## 2020-06-26 ENCOUNTER — Other Ambulatory Visit (HOSPITAL_COMMUNITY): Payer: Self-pay | Admitting: Specialist

## 2020-06-26 DIAGNOSIS — R1319 Other dysphagia: Secondary | ICD-10-CM

## 2020-06-26 DIAGNOSIS — R1310 Dysphagia, unspecified: Secondary | ICD-10-CM

## 2020-07-01 ENCOUNTER — Other Ambulatory Visit: Payer: Self-pay | Admitting: Gastroenterology

## 2020-07-03 ENCOUNTER — Ambulatory Visit (HOSPITAL_COMMUNITY)
Admission: RE | Admit: 2020-07-03 | Discharge: 2020-07-03 | Disposition: A | Discharge status: Home / Self Care | Payer: Medicare Other | Source: Ambulatory Visit | Attending: Gastroenterology | Admitting: Gastroenterology

## 2020-07-03 ENCOUNTER — Encounter (HOSPITAL_COMMUNITY): Payer: Self-pay | Admitting: Speech Pathology

## 2020-07-03 ENCOUNTER — Other Ambulatory Visit: Payer: Self-pay

## 2020-07-03 ENCOUNTER — Ambulatory Visit (HOSPITAL_COMMUNITY): Payer: Medicare Other | Attending: Gastroenterology | Admitting: Speech Pathology

## 2020-07-03 DIAGNOSIS — R1312 Dysphagia, oropharyngeal phase: Secondary | ICD-10-CM | POA: Diagnosis not present

## 2020-07-03 DIAGNOSIS — R1319 Other dysphagia: Secondary | ICD-10-CM | POA: Diagnosis not present

## 2020-07-03 DIAGNOSIS — R1310 Dysphagia, unspecified: Secondary | ICD-10-CM | POA: Insufficient documentation

## 2020-07-03 DIAGNOSIS — Z0389 Encounter for observation for other suspected diseases and conditions ruled out: Secondary | ICD-10-CM | POA: Diagnosis not present

## 2020-07-03 NOTE — Therapy (Signed)
[place], [place], [address] Phone: [phone]   Fax:  [phone]  Modified Barium Swallow  Patient Details  Name: [name] MRN: [id number] Date of Birth: 1946/04/07 No data recorded  Encounter Date: 07/03/2020   End of Session - 07/03/20 1344    Visit Number 1    Number of Visits 1    Authorization Type [employer] Medicare    SLP Start Time 1140    SLP Stop Time  1207    SLP Time Calculation (min) 27 min    Activity Tolerance Patient tolerated treatment well           Past Medical History:  Diagnosis Date  . Adenomatous colon polyp 2006   excised in 2006 & 2010Due surveillance 06/2013  . Anxiety   . Anxiety and depression   . Asthma   . Breast mass    right nipple bengn mass per patient  . Breast tumor   . Chest discomfort   . Chronic back pain   . Chronic constipation   . Complication of anesthesia   . Depression   . Diverticulosis   . Fibromyalgia   . Gastroesophageal reflux disease   . Hiatal hernia   . History of benign esophageal tumor 2010   granular cell esophageal tumor (Dx 06/2008), resected via EMR 2011, due repeat EGD 02/2012  . Hyperlipidemia   . Hypertension   . Insomnia   . Migraines   . PONV (postoperative nausea and vomiting)   . Psychosis ([place])   . Sleep apnea    Stop Bang score of 5. Pt said she was told by Dr. Merlene Laughter that she had "a little bit" of sleep apena, but not bad enough to treat.  . Thyroid nodule   . Tumor of esophagus     Past Surgical History:  Procedure Laterality Date  . ABDOMINAL HYSTERECTOMY    . BIOPSY  10/14/2019   Procedure: BIOPSY;  Surgeon: Daneil Dolin, MD;  Location: [facility];  Service: Endoscopy;;  . BRAVO [place] STUDY  12/11/2011   Procedure: BRAVO [place];  Surgeon: Daneil Dolin, MD;  [facility]: [facility];  Service: Endoscopy;  Laterality: N/A;  . 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  CHOLECYSTECTOMY N/A 04/09/2013   Procedure: LAPAROSCOPIC CHOLECYSTECTOMY;  Surgeon: Jamesetta So, MD;  Location: [facility];  Service: General;  Laterality: N/A;  . COLONOSCOPY  06/2008, 06/2011   sigmoid tics, tubular adenoma; 2013: anal canal hemorrhoids  . COLONOSCOPY  07/10/2011   Anal canal hemorrhoids likely the cause of hematochezia in the setting of constipation; otherwise normal rectum ;submucosal  petechiae in left colon of doubtful clinical significance; otherwise, normal colon  . COLONOSCOPY WITH PROPOFOL N/A 11/10/2017   cancelled in pre-op  . COLONOSCOPY WITH PROPOFOL N/A 07/23/2018   Procedure: COLONOSCOPY WITH PROPOFOL;  Surgeon: Daneil Dolin, MD; 1 tubular adenoma, diverticulosis in the sigmoid and descending colon, nonbleeding internal hemorrhoids.  No recommendations to repeat due to age.  . ESOPHAGEAL DILATION N/A 05/08/2015   Procedure: ESOPHAGEAL DILATION;  Surgeon: Daneil Dolin, MD;  Location: [facility];  Service: Endoscopy;  Laterality: N/AVenia Minks 54/56  . ESOPHAGOGASTRODUODENOSCOPY  12/11/2011   [id number] Schatzki's ring; otherwise normal/Small hiatal hernia. Antral and bulbar erosions  . ESOPHAGOGASTRODUODENOSCOPY (EGD) WITH PROPOFOL N/A 05/08/2015   Dr. Gala Romney: mild erosive reflux esophagitis, non-critical Schatzki's ring s/p dilation. Hiatal hernia.   Marland Kitchen  ESOPHAGOGASTRODUODENOSCOPY (EGD) WITH PROPOFOL N/A 10/14/2019   Procedure: ESOPHAGOGASTRODUODENOSCOPY (EGD) WITH PROPOFOL;  Surgeon: Daneil Dolin, MD;  Nonobstructing Schatzki ring, small hiatal hernia, abnormal gastric mucosa s/p biopsied, normal first and second portion of the duodenum.  Pathology with chronic inactive gastritis, no H. pylori.  . EUS  08/2010   Dr [facility] with EGD. Retained food. No recurrent esophageal lesion, bx negative.  Marland Kitchen LUNG BIOPSY     negative  . PARATHYROIDECTOMY  April 2017   [facility].   Marland Kitchen PARATHYROIDECTOMY    . POLYPECTOMY  07/23/2018   Procedure: POLYPECTOMY;  Surgeon: Daneil Dolin, MD;  [facility];  Service: Endoscopy;;  colon  . RIGHT OOPHORECTOMY     benign disease  . SHOULDER ARTHROSCOPY     Right; bone spurs removed  . TONSILLECTOMY    . TOTAL KNEE ARTHROPLASTY  2002   Right; previous arthroscopic surgery    There were no vitals filed for this visit.   Subjective Assessment - 07/03/20 1334    Subjective Pt reports difficulty swallowing small pills    Special Tests MBSS    Currently in Pain? No/denies               General - 07/03/20 1335      General Information   Date of Onset 06/21/20    HPI [name] is a 75 yo female who was referred for MBSS by Aliene Altes, PA-C due to Pt with reporting sensation of choking on foods and pills. Pt with history of  GERD, esophageal granular cell tumor s/p EMR in 2011 by Dr. Newman Pies with [facility]. Has also seen Dr. Newman Pies for uncontrolled GERD in 2014 s/p EGDs, normal GES, and a variety of PPIs. Manometry had been attempted but she was unable to tolerate the probe.  EGD in April 2021 with Dr. Gala Romney due to uncontrolled GERD/odynophagia revealing nonobstructing Schatzki's ring, small hiatal hernia, abnormal gastric mucosa (chronic inactive gastritis, no H. pylori on biopsy), normal examined duodenum.  Suspected daily Goody powders likely explain refractory upper GI symptoms.  Also with chronic constipation likely opioid induced, and adenomatous colon polyps with last colonoscopy in January 2020 and no recommendations to repeat due to age. Pt tells SLP that she occasionally gets "choked" on water and that small pills "don't go down".    Type of Study MBS-Modified Barium Swallow Study    Diet Prior to this Study Regular;Thin liquids    Temperature Spikes Noted No    Respiratory Status Room air    History of Recent Intubation No    Behavior/Cognition Alert;Cooperative;Pleasant mood    Oral Cavity Assessment Within Functional Limits    Oral Care Completed by SLP No    Oral Cavity - Dentition  Dentures, top    Vision Functional for self feeding    Self-Feeding Abilities Able to feed self    Patient Positioning Upright in chair    Baseline Vocal Quality Normal    Volitional Cough Strong    Volitional Swallow Able to elicit    Anatomy Within functional limits   bony prominence C3-4   Pharyngeal Secretions Not observed secondary MBS              Oral Preparation/Oral Phase - 07/03/20 1336      Oral Preparation/Oral Phase   Oral Phase Impaired      Oral - Solids   Oral - Puree Piecemeal swallowing    Oral - Regular Piecemeal swallowing;Delayed A-P transit  Oral - Pill Delayed A-P transit   required a second sip to propel pill posteriorly     Electrical stimulation - Oral Phase   Was Electrical Stimulation Used No            Pharyngeal Phase - 07/03/20 1338      Pharyngeal Phase   Pharyngeal Phase Impaired      Pharyngeal - Thin   Pharyngeal- Thin Teaspoon Within functional limits    Pharyngeal- Thin Cup Within functional limits    Pharyngeal- Thin Straw Swallow initiation at pyriform sinus;Lateral channel residue   dry swallow clears residuals     Pharyngeal - Solids   Pharyngeal- Puree Within functional limits    Pharyngeal- Regular Within functional limits    Pharyngeal- Pill Within functional limits      Electrical Stimulation - Pharyngeal Phase   Was Electrical Stimulation Used No            Cricopharyngeal Phase - 07/03/20 1341      Cervical Esophageal Phase   Cervical Esophageal Phase Within functional limits   radiologist identified diffuse esophageal dysmotility            Plan - 07/03/20 1345    Clinical Impression Statement MBSS completed. Pt presents with normal to mild oral phase dysphagia characterized by Pt with upper dentures and prolonged oral transit with solids with piecemeal deglutition, mildly reduced tongue base retraction with min vallecular and lateral channel residue with straw sips of thin liquid which clear with a  dry swallow. Pt unable to propel pill posteriorly in oral cavity on first sip of liquid, but able on the second sip. Pill passed through the UES without delay. Pt without penetration, aspiration, or significant oral or pharyngeal residuals with consistencies and textures presented. Pt's symptoms of "choking" not replicated during today's study despite challenging Pt with increased bolus size. Recommend regular textures and thin liquids with standard aspiration and reflux precautions. No further SLP services indicated. Pt was given my contact information should she have further questions.           Patient will benefit from skilled therapeutic intervention in order to improve the following deficits and impairments:   Dysphagia, oropharyngeal phase     Recommendations/Treatment - 07/03/20 1342      Swallow Evaluation Recommendations   SLP Diet Recommendations Thin;Age appropriate regular    Liquid Administration via Cup;Straw    Medication Administration Whole meds with liquid   encouraged her to try pills in puree   Supervision Patient able to self feed    Compensations Multiple dry swallows after each bite/sip    Postural Changes Seated upright at 90 degrees;Remain upright for at least 30 minutes after feeds/meals            Prognosis - 07/03/20 1344      Prognosis   Prognosis for Safe Diet Advancement Good      Individuals Consulted   Consulted and Agree with Results and Recommendations Patient    Report Sent to  Referring physician           Problem List Patient Active Problem List   Diagnosis Date Noted  . Neck pain 03/20/2020  . Bloating 11/18/2019  . Chronic migraine without aura, with status migrainosus 11/04/2019  . Mild cognitive disorder 11/04/2019  . Dysphagia   . H/O adenomatous polyp of colon 09/29/2017  . Lower abdominal pain 09/29/2017  . Obstructive sleep apnea 09/06/2017  . BMI 30.0-30.9,adult 03/07/2017  . Primary osteoarthritis of both  hands  03/07/2017  . Lower extremity edema 03/03/2017  . Restless legs syndrome 01/20/2017  . Thyroid nodule 10/17/2016  . Pruritus 10/06/2016  . Major depressive disorder with psychotic features ([place]) 09/11/2016  . Neurocognitive deficits 09/11/2016  . (HFpEF) heart failure with preserved ejection fraction ([place]) 07/22/2016  . Benzodiazepine withdrawal, with perceptual disturbance ([place]) 07/22/2016  . Urinary frequency 11/07/2015  . Abdominal pain 07/26/2015  . Hyperparathyroidism ([place]) 06/15/2015  . History of depression 06/14/2015  . Hypercalcemia 06/13/2015  . Major neurocognitive disorder ([place]) 06/13/2015  . Confusion 05/29/2015  . Odynophagia   . Schatzki's ring   . Hiatal hernia   . Knee contusion 05/02/2014  . Acquired trigger finger 10/12/2013  . Chronic constipation 07/15/2013  . Abdominal pain, epigastric 07/15/2013  . LLQ pain 07/15/2013  . Lightheaded 01/21/2013  . Elevated LFTs 12/26/2012  . History of benign esophageal tumor   . Adenomatous colon polyp   . [place] DISEASE, LUMBOSACRAL SPINE 05/01/2010  . HYPERLIPIDEMIA 08/22/2009  . THYROID NODULE 08/16/2009  . Anxiety and depression 08/16/2009  . Essential hypertension 08/16/2009  . Gastroesophageal reflux disease 08/16/2009  . FIBROMYALGIA 08/16/2009   Thank you,  Genene Churn, CCC-SLP [phone]  [facility] 07/03/2020, 1:56 PM  [place] [address] [place], [place], [address] Phone: [phone]   Fax:  [phone]  Name: [name] MRN: [id number] Date of Birth: [birth year]

## 2020-07-04 ENCOUNTER — Other Ambulatory Visit (HOSPITAL_COMMUNITY): Payer: Self-pay | Admitting: Interventional Radiology

## 2020-07-04 DIAGNOSIS — S22060A Wedge compression fracture of T7-T8 vertebra, initial encounter for closed fracture: Secondary | ICD-10-CM

## 2020-07-04 DIAGNOSIS — S22070A Wedge compression fracture of T9-T10 vertebra, initial encounter for closed fracture: Secondary | ICD-10-CM

## 2020-07-05 DIAGNOSIS — G43701 Chronic migraine without aura, not intractable, with status migrainosus: Secondary | ICD-10-CM | POA: Diagnosis not present

## 2020-07-11 ENCOUNTER — Ambulatory Visit (HOSPITAL_COMMUNITY): Admission: RE | Admit: 2020-07-11 | Payer: Medicare Other | Source: Ambulatory Visit

## 2020-07-13 ENCOUNTER — Ambulatory Visit (HOSPITAL_COMMUNITY): Admission: RE | Admit: 2020-07-13 | Payer: Medicare Other | Source: Ambulatory Visit

## 2020-07-17 ENCOUNTER — Ambulatory Visit (HOSPITAL_COMMUNITY): Admission: RE | Admit: 2020-07-17 | Payer: Medicare Other | Source: Ambulatory Visit

## 2020-07-19 ENCOUNTER — Other Ambulatory Visit: Payer: Self-pay

## 2020-07-19 ENCOUNTER — Ambulatory Visit (HOSPITAL_COMMUNITY)
Admission: RE | Admit: 2020-07-19 | Discharge: 2020-07-19 | Disposition: A | Discharge status: Home / Self Care | Payer: Medicare Other | Source: Ambulatory Visit | Attending: Interventional Radiology | Admitting: Interventional Radiology

## 2020-07-19 DIAGNOSIS — S22070A Wedge compression fracture of T9-T10 vertebra, initial encounter for closed fracture: Secondary | ICD-10-CM

## 2020-07-19 DIAGNOSIS — S22060A Wedge compression fracture of T7-T8 vertebra, initial encounter for closed fracture: Secondary | ICD-10-CM

## 2020-07-19 NOTE — Consult Note (Signed)
Chief Complaint: Patient was seen in consultation today for T8 and T9 compression fractures/vertebral augmentation.  Referring Physician(s): Carole Civil (orthopedics)  Supervising Physician: Luanne Bras  Patient Status: [facility] - Out-pt  History of Present Illness: [name] is a 75 y.o. female with a past medical history as below, with pertinent past medical history including hypertension, hyperlipidemia, migraines, asthma, benign esophageal tumor, diverticulosis, hiatal hernia, GERD, OSA, fibromyalgia, insomnia, anxiety, and depression. In 02/2020, patient suffered multiple falls and developed severe back pain following this. DG cervical/thoracic/lumbar spine unremarkable. Pain persisted, so she saw her orthopedic surgeon for further evaluation. She was started on conservative management (brace, PO pain control), however pain persisted. MR thoracic spine was ordered which revealed T8 and T9 compression fractures.  MR thoracic spine 06/13/2020: 1. Compression fracture of the inferior endplate of T8 and superior endplate of T9 resulting in approximately 50% and 35% loss of vertebral body height, respectively, without significant retropulsion. Associated marrow edema is consistent with acute/subacute fracture. 2. Mild degenerative changes of the thoracic spine as described above. Mild spinal canal stenosis at T8-9.  NIR consulted by Dr. Aline Brochure for possible image-guided T8/T9 vertebral augmentation (kyphoplasty/vertebroplasty). Patient awake and alert sitting in chair. Accompanied by sister. Complains of midline mid back pain, rated 12/10 at its worst. States pain medications help with pain, but it does not completely go away. Denies numbness/tingling or pain down bilateral legs.   Past Medical History:  Diagnosis Date  . Adenomatous colon polyp 2006   excised in 2006 & 2010Due surveillance 06/2013  . Anxiety   . Anxiety and depression   . Asthma   . Breast mass     right nipple bengn mass per patient  . Breast tumor   . Chest discomfort   . Chronic back pain   . Chronic constipation   . Complication of anesthesia   . Depression   . Diverticulosis   . Fibromyalgia   . Gastroesophageal reflux disease   . Hiatal hernia   . History of benign esophageal tumor 2010   granular cell esophageal tumor (Dx 06/2008), resected via EMR 2011, due repeat EGD 02/2012  . Hyperlipidemia   . Hypertension   . Insomnia   . Migraines   . PONV (postoperative nausea and vomiting)   . Psychosis ([place])   . Sleep apnea    Stop Bang score of 5. Pt said she was told by Dr. Merlene Laughter that she had "a little bit" of sleep apena, but not bad enough to treat.  . Thyroid nodule   . Tumor of esophagus     Past Surgical History:  Procedure Laterality Date  . ABDOMINAL HYSTERECTOMY    . BIOPSY  10/14/2019   Procedure: BIOPSY;  Surgeon: Daneil Dolin, MD;  Location: [facility] END[facility];  Service: Endoscopy;;  . BRAVO [place] STUDY  12/11/2011   Procedure: BRAVO [place];  Surgeon: Daneil Dolin, MD;  Location: [facility] END[facility];  Service: Endoscopy;  Laterality: N/A;  . 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 CHOLECYSTECTOMY N/A 04/09/2013   Procedure: LAPAROSCOPIC CHOLECYSTECTOMY;  Surgeon: Jamesetta So, MD;  Location: [facility] ORS;  Service: General;  Laterality: N/A;  . COLONOSCOPY  06/2008, 06/2011   sigmoid tics, tubular adenoma; 2013: anal canal hemorrhoids  . COLONOSCOPY  07/10/2011   Anal canal hemorrhoids likely the cause of hematochezia in the setting of constipation; otherwise normal rectum ;submucosal  petechiae in left colon of doubtful clinical  significance; otherwise, normal colon  . COLONOSCOPY WITH PROPOFOL N/A 11/10/2017   cancelled in pre-op  . COLONOSCOPY WITH PROPOFOL N/A 07/23/2018   Procedure: COLONOSCOPY WITH PROPOFOL;  Surgeon: Daneil Dolin, MD; 1 tubular adenoma, diverticulosis in the sigmoid and descending colon, nonbleeding  internal hemorrhoids.  No recommendations to repeat due to age.  . ESOPHAGEAL DILATION N/A 05/08/2015   Procedure: ESOPHAGEAL DILATION;  Surgeon: Daneil Dolin, MD;  Location: [facility] ORS;  Service: Endoscopy;  Laterality: N/AVenia Minks 54/56  . ESOPHAGOGASTRODUODENOSCOPY  12/11/2011   [id number] Schatzki's ring; otherwise normal/Small hiatal hernia. Antral and bulbar erosions  . ESOPHAGOGASTRODUODENOSCOPY (EGD) WITH PROPOFOL N/A 05/08/2015   Dr. Gala Romney: mild erosive reflux esophagitis, non-critical Schatzki's ring s/p dilation. Hiatal hernia.   Marland Kitchen ESOPHAGOGASTRODUODENOSCOPY (EGD) WITH PROPOFOL N/A 10/14/2019   Procedure: ESOPHAGOGASTRODUODENOSCOPY (EGD) WITH PROPOFOL;  Surgeon: Daneil Dolin, MD;  Nonobstructing Schatzki ring, small hiatal hernia, abnormal gastric mucosa s/p biopsied, normal first and second portion of the duodenum.  Pathology with chronic inactive gastritis, no H. pylori.  . EUS  08/2010   Dr [facility] with EGD. Retained food. No recurrent esophageal lesion, bx negative.  Marland Kitchen LUNG BIOPSY     negative  . PARATHYROIDECTOMY  April 2017   [facility].   Marland Kitchen PARATHYROIDECTOMY    . POLYPECTOMY  07/23/2018   Procedure: POLYPECTOMY;  Surgeon: Daneil Dolin, MD;  [facility];  Service: Endoscopy;;  colon  . RIGHT OOPHORECTOMY     benign disease  . SHOULDER ARTHROSCOPY     Right; bone spurs removed  . TONSILLECTOMY    . TOTAL KNEE ARTHROPLASTY  2002   Right; previous arthroscopic surgery    Allergies: Elavil [amitriptyline], Abilify [aripiprazole], Codeine, Latex, Penicillins, Polyethylene glycol, Sulfonamide derivatives, Cymbalta [duloxetine hcl], and Remeron [mirtazapine]  Medications: Prior to Admission medications   Medication Sig Start Date End Date Taking? Authorizing Provider  AIMOVIG 70 MG/ML SOAJ Inject 1 Syringe into the skin every 30 (thirty) days. 05/23/20   [provider]  albuterol (PROVENTIL HFA;VENTOLIN HFA) 108 (90 BASE) MCG/ACT inhaler Inhale 2  puffs into the lungs every 6 (six) hours as needed for wheezing or shortness of breath.     [provider]  amLODipine (NORVASC) 10 MG tablet Take 10 mg by mouth daily. 12/18/15   [provider]  atenolol (TENORMIN) 100 MG tablet Take 1 tablet (100 mg total) by mouth daily. 02/09/20   Strader, Fransisco Hertz, PA-C  atenolol (TENORMIN) 25 MG tablet Take 25 mg daily in addition to 100 mg daily for total dose of 125 mg daily 02/09/20   Ahmed Prima, [place] M, PA-C  CALCIUM-VITAMIN D PO Take 1 tablet by mouth daily.    [provider]  chlorthalidone (HYGROTON) 25 MG tablet Take 1/2 tablet by mouth daily. 11/02/19   Arnoldo Lenis, MD  cycloSPORINE (RESTASIS) 0.05 % ophthalmic emulsion Place 1 drop into both eyes 2 (two) times daily.    [provider]  famotidine (PEPCID) 20 MG tablet Take 1 tablet (20 mg total) by mouth as needed for heartburn or indigestion. 06/21/20   Erenest Rasher, PA-C  fluticasone (FLONASE) 50 MCG/ACT nasal spray Place 1 spray into both nostrils daily as needed for allergies.     [provider]  furosemide (LASIX) 40 MG tablet Take 40 mg daily as needed for swelling. 06/01/19   Arnoldo Lenis, MD  gabapentin (NEURONTIN) 300 MG capsule Take 300 mg by mouth at bedtime.  [provider]  HYDROcodone-acetaminophen (NORCO/VICODIN) 5-325 MG tablet hydrocodone 5 mg-acetaminophen 325 mg tablet  Take 1 tablet every day by oral route as needed.    [provider]  INGREZZA 80 MG CAPS Take 1 capsule by mouth daily. 04/28/20   [provider]  lovastatin (MEVACOR) 40 MG tablet Take 40 mg by mouth at bedtime.    [provider]  Melatonin 3 MG TABS Take 9 mg by mouth at bedtime.     [provider]  memantine (NAMENDA) 10 MG tablet Take 10 mg by mouth 2 (two) times daily.  10/20/17   [provider]  neomycin-bacitracin-polymyxin (NEOSPORIN) [id number] ointment Apply 1 application topically  as needed.     [provider]  OLANZapine (ZYPREXA) 7.5 MG tablet Take 7.5 mg by mouth at bedtime.    [provider]  potassium chloride SA (KLOR-CON M20) 20 MEQ tablet Take 40 meq in the AM & 20 meq in the PM. Patient taking differently: Take 20 mEq by mouth daily. 10/13/19   Arnoldo Lenis, MD  RABEprazole (ACIPHEX) 20 MG tablet Take 1 tablet by mouth daily before breakfast. Patient taking differently: 20 mg 2 (two) times daily. 01/07/20   Carlis Stable, NP  Semaglutide (RYBELSUS) 7 MG TABS Take by mouth.    [provider]  spironolactone (ALDACTONE) 25 MG tablet Take 25 mg by mouth daily. 12/18/15   [provider]  traZODone (DESYREL) 100 MG tablet Take 300 mg by mouth at bedtime.  07/31/17   [provider]  TRULANCE 3 MG TABS TAKE 1 TABLET BY MOUTH  DAILY 07/03/20   [name], NP  venlafaxine XR (EFFEXOR-XR) 150 MG 24 hr capsule Take 150 mg by mouth at bedtime.     [provider]  venlafaxine XR (EFFEXOR-XR) 75 MG 24 hr capsule Take 75 mg by mouth at bedtime.     [provider]  zolpidem (AMBIEN) 5 MG tablet zolpidem 5 mg tablet  Take 2 tablets every day by oral route.  1-2 q hs; discontinue trazodone    [provider]     Family History  Problem Relation Age of Onset  . Stroke Father   . Alcohol abuse Father   . Heart attack Mother   . Depression Mother   . Anxiety disorder Mother   . Colon cancer Paternal Aunt   . Colon cancer Paternal Uncle   . Dementia Maternal Uncle   . ADD / ADHD Neg Hx   . Bipolar disorder Neg Hx   . Drug abuse Neg Hx   . OCD Neg Hx   . Paranoid behavior Neg Hx   . Schizophrenia Neg Hx   . Seizures Neg Hx   . Sexual abuse Neg Hx   . Physical abuse Neg Hx     Social History   Socioeconomic History  . Marital status: Single    Spouse name: Not on file  . Number of children: 1  . Years of education: Not on file  . Highest education level: Not on file  Occupational  History  . Occupation: disabled    Employer: RETIRED  Tobacco Use  . Smoking status: Never Smoker  . Smokeless tobacco: Never Used  Vaping Use  . Vaping Use: Never used  Substance and Sexual Activity  . Alcohol use: No    Alcohol/week: 0.0 standard drinks  . Drug use: No  . Sexual activity: Never  Other Topics Concern  . Not on  file  Social History Narrative  . Not on file   Social Determinants of Health   Financial Resource Strain: Not on file  Food Insecurity: Not on file  Transportation Needs: Not on file  Physical Activity: Not on file  Stress: Not on file  Social Connections: Not on file     Review of Systems: A 12 point ROS discussed and pertinent positives are indicated in the HPI above.  All other systems are negative.  Review of Systems  Constitutional: Negative for chills and fever.  Musculoskeletal: Positive for back pain.  Neurological: Negative for numbness.    Vital Signs: There were no vitals taken for this visit.  Physical Exam Vitals and nursing note reviewed.  Constitutional:      General: She is not in acute distress. Pulmonary:     Effort: Pulmonary effort is normal. No respiratory distress.  Musculoskeletal:     Comments: (+) TLSO brace. Mild-moderate tenderness of midline mid spine.  Skin:    General: Skin is warm and dry.  Neurological:     Mental Status: She is alert and oriented to person, place, and time.      Imaging: DG OP Swallowing Func-Medicare/Speech Path  Result Date: 07/03/2020 [facility] [address] [place], [place], [address] Phone: [phone]   Fax:  [phone] Modified Barium Swallow Patient Details Name: ARACELIA EMSHOFF MRN: [id number] Date of Birth: [birth year] No data recorded Encounter Date: 07/03/2020  End of Session - 07/03/20 1344   Visit Number 1   Number of Visits 1   Authorization Type [employer] Medicare   SLP Start Time 1140   SLP Stop Time  1207   SLP Time Calculation  (min) 27 min   Activity Tolerance Patient tolerated treatment well     Past Medical History: Diagnosis Date . Adenomatous colon polyp 2006  excised in 2006 & 2010Due surveillance 06/2013 . Anxiety  . Anxiety and depression  . Asthma  . Breast mass   right nipple bengn mass per patient . Breast tumor  . Chest discomfort  . Chronic back pain  . Chronic constipation  . Complication of anesthesia  . Depression  . Diverticulosis  . Fibromyalgia  . Gastroesophageal reflux disease  . Hiatal hernia  . History of benign esophageal tumor 2010  granular cell esophageal tumor (Dx 06/2008), resected via EMR 2011, due repeat EGD 02/2012 . Hyperlipidemia  . Hypertension  . Insomnia  . Migraines  . PONV (postoperative nausea and vomiting)  . Psychosis ([facility])  . Sleep apnea   Stop Bang score of 5. Pt said she was told by Dr. Gerilyn Pilgrim that she had "a little bit" of sleep apena, but not bad enough to treat. . Thyroid nodule  . Tumor of esophagus  Past Surgical History: Procedure Laterality Date . ABDOMINAL HYSTERECTOMY   . BIOPSY  10/14/2019  Procedure: BIOPSY;  Surgeon: Corbin Ade, MD;  [facility];  Service: Endoscopy;; . BRAVO [place] STUDY  12/11/2011  Procedure: BRAVO PH STUDY;  Surgeon: Corbin Ade, MD;  Location: [facility];  Service: Endoscopy;  Laterality: N/A; . BREAST EXCISIONAL BIOPSY  1990s, 2012  Left x2-sclerosing ductal papilloma-2012 . CHOLECYSTECTOMY N/A 04/09/2013  Procedure: LAPAROSCOPIC CHOLECYSTECTOMY;  Surgeon: Dalia Heading, MD;  Location: [facility] ORS;  Service: General;  Laterality: N/A; . COLONOSCOPY  06/2008, 06/2011  sigmoid tics, tubular adenoma; 2013: anal canal hemorrhoids . COLONOSCOPY  07/10/2011  Anal canal hemorrhoids likely the cause of hematochezia  in the setting of constipation; otherwise normal rectum ;submucosal  petechiae in left colon of doubtful clinical significance; otherwise, normal colon . COLONOSCOPY WITH PROPOFOL N/A 11/10/2017  cancelled in pre-op . COLONOSCOPY WITH PROPOFOL N/A  07/23/2018  Procedure: COLONOSCOPY WITH PROPOFOL;  Surgeon: Daneil Dolin, MD; 1 tubular adenoma, diverticulosis in the sigmoid and descending colon, nonbleeding internal hemorrhoids.  No recommendations to repeat due to age. . ESOPHAGEAL DILATION N/A 05/08/2015  Procedure: ESOPHAGEAL DILATION;  Surgeon: Daneil Dolin, MD;  Location: [facility] ORS;  Service: Endoscopy;  Laterality: N/AVenia Minks 54/56 . ESOPHAGOGASTRODUODENOSCOPY  12/11/2011  [id number] Schatzki's ring; otherwise normal/Small hiatal hernia. Antral and bulbar erosions . ESOPHAGOGASTRODUODENOSCOPY (EGD) WITH PROPOFOL N/A 05/08/2015  Dr. Gala Romney: mild erosive reflux esophagitis, non-critical Schatzki's ring s/p dilation. Hiatal hernia.  Marland Kitchen ESOPHAGOGASTRODUODENOSCOPY (EGD) WITH PROPOFOL N/A 10/14/2019  Procedure: ESOPHAGOGASTRODUODENOSCOPY (EGD) WITH PROPOFOL;  Surgeon: Daneil Dolin, MD;  Nonobstructing Schatzki ring, small hiatal hernia, abnormal gastric mucosa s/p biopsied, normal first and second portion of the duodenum.  Pathology with chronic inactive gastritis, no H. pylori. . EUS  08/2010  Dr [facility] with EGD. Retained food. No recurrent esophageal lesion, bx negative. Marland Kitchen LUNG BIOPSY    negative . PARATHYROIDECTOMY  April 2017  [facility].  Marland Kitchen PARATHYROIDECTOMY   . POLYPECTOMY  07/23/2018  Procedure: POLYPECTOMY;  Surgeon: Daneil Dolin, MD;  [facility];  Service: Endoscopy;;  colon . RIGHT OOPHORECTOMY    benign disease . SHOULDER ARTHROSCOPY    Right; bone spurs removed . TONSILLECTOMY   . TOTAL KNEE ARTHROPLASTY  2002  Right; previous arthroscopic surgery There were no vitals filed for this visit.  Subjective Assessment - 07/03/20 1334   Subjective Pt reports difficulty swallowing small pills   Special Tests MBSS   Currently in Pain? No/denies      General - 07/03/20 1335    General Information  Date of Onset 06/21/20   HPI Shamela Torrealba is a 75 yo female who was referred for MBSS by Aliene Altes, PA-C due to Pt with reporting  sensation of choking on foods and pills. Pt with history of  GERD, esophageal granular cell tumor s/p EMR in 2011 by Dr. Newman Pies with [facility]. Has also seen Dr. Newman Pies for uncontrolled GERD in 2014 s/p EGDs, normal GES, and a variety of PPIs. Manometry had been attempted but she was unable to tolerate the probe.  EGD in April 2021 with Dr. Gala Romney due to uncontrolled GERD/odynophagia revealing nonobstructing Schatzki's ring, small hiatal hernia, abnormal gastric mucosa (chronic inactive gastritis, no H. pylori on biopsy), normal examined duodenum.  Suspected daily Goody powders likely explain refractory upper GI symptoms.  Also with chronic constipation likely opioid induced, and adenomatous colon polyps with last colonoscopy in January 2020 and no recommendations to repeat due to age. Pt tells SLP that she occasionally gets "choked" on water and that small pills "don't go down".   Type of Study MBS-Modified Barium Swallow Study   Diet Prior to this Study Regular;Thin liquids   Temperature Spikes Noted No   Respiratory Status Room air   History of Recent Intubation No   Behavior/Cognition Alert;Cooperative;Pleasant mood   Oral Cavity Assessment Within Functional Limits   Oral Care Completed by SLP No   Oral Cavity - Dentition Dentures, top   Vision Functional for self feeding   Self-Feeding Abilities Able to feed self   Patient Positioning Upright in chair   Baseline Vocal Quality Normal   Volitional Cough Strong   Volitional  Swallow Able to elicit   Anatomy Within functional limits   bony prominence C3-4  Pharyngeal Secretions Not observed secondary MBS      Oral Preparation/Oral Phase - 07/03/20 1336    Oral Preparation/Oral Phase  Oral Phase Impaired    Oral - Solids  Oral - Puree Piecemeal swallowing   Oral - Regular Piecemeal swallowing;Delayed A-P transit   Oral - Pill Delayed A-P transit   required a second sip to propel pill posteriorly   Electrical stimulation - Oral Phase  Was Electrical Stimulation Used No       Pharyngeal Phase - 07/03/20 1338    Pharyngeal Phase  Pharyngeal Phase Impaired    Pharyngeal - Thin  Pharyngeal- Thin Teaspoon Within functional limits   Pharyngeal- Thin Cup Within functional limits   Pharyngeal- Thin Straw Swallow initiation at pyriform sinus;Lateral channel residue   dry swallow clears residuals   Pharyngeal - Solids  Pharyngeal- Puree Within functional limits   Pharyngeal- Regular Within functional limits   Pharyngeal- Pill Within functional limits    Electrical Stimulation - Pharyngeal Phase  Was Electrical Stimulation Used No      Cricopharyngeal Phase - 07/03/20 1341    Cervical Esophageal Phase  Cervical Esophageal Phase Within functional limits   radiologist identified diffuse esophageal dysmotility     Plan - 07/03/20 1345   Clinical Impression Statement MBSS completed. Pt presents with normal to mild oral phase dysphagia characterized by Pt with upper dentures and prolonged oral transit with solids with piecemeal deglutition, mildly reduced tongue base retraction with min vallecular and lateral channel residue with straw sips of thin liquid which clear with a dry swallow. Pt unable to propel pill posteriorly in oral cavity on first sip of liquid, but able on the second sip. Pill passed through the UES without delay. Pt without penetration, aspiration, or significant oral or pharyngeal residuals with consistencies and textures presented. Pt's symptoms of "choking" not replicated during today's study despite challenging Pt with increased bolus size. Recommend regular textures and thin liquids with standard aspiration and reflux precautions. No further SLP services indicated. Pt was given my contact information should she have further questions.     Patient will benefit from skilled therapeutic intervention in order to improve the following deficits and impairments:  Dysphagia, oropharyngeal phase  Recommendations/Treatment - 07/03/20 1342    Swallow Evaluation Recommendations  SLP Diet  Recommendations Thin;Age appropriate regular   Liquid Administration via Cup;Straw   Medication Administration Whole meds with liquid   encouraged her to try pills in puree  Supervision Patient able to self feed   Compensations Multiple dry swallows after each bite/sip   Postural Changes Seated upright at 90 degrees;Remain upright for at least 30 minutes after feeds/meals      Prognosis - 07/03/20 1344    Prognosis  Prognosis for Safe Diet Advancement Good    Individuals Consulted  Consulted and Agree with Results and Recommendations Patient   Report Sent to  Referring physician     Problem List Patient Active Problem List  Diagnosis Date Noted . Neck pain 03/20/2020 . Bloating 11/18/2019 . Chronic migraine without aura, with status migrainosus 11/04/2019 . Mild cognitive disorder 11/04/2019 . Dysphagia  . H/O adenomatous polyp of colon 09/29/2017 . Lower abdominal pain 09/29/2017 . Obstructive sleep apnea 09/06/2017 . BMI 30.0-30.9,adult 03/07/2017 . Primary osteoarthritis of both hands 03/07/2017 . Lower extremity edema 03/03/2017 . Restless legs syndrome 01/20/2017 . Thyroid nodule 10/17/2016 . Pruritus 10/06/2016 . Major depressive disorder  with psychotic features ([place]) 09/11/2016 . Neurocognitive deficits 09/11/2016 . (HFpEF) heart failure with preserved ejection fraction ([place]) 07/22/2016 . Benzodiazepine withdrawal, with perceptual disturbance ([place]) 07/22/2016 . Urinary frequency 11/07/2015 . Abdominal pain 07/26/2015 . Hyperparathyroidism ([place]) 06/15/2015 . History of depression 06/14/2015 . Hypercalcemia 06/13/2015 . Major neurocognitive disorder ([place]) 06/13/2015 . Confusion 05/29/2015 . Odynophagia  . Schatzki's ring  . Hiatal hernia  . Knee contusion 05/02/2014 . Acquired trigger finger 10/12/2013 . Chronic constipation 07/15/2013 . Abdominal pain, epigastric 07/15/2013 . LLQ pain 07/15/2013 . Lightheaded 01/21/2013 . Elevated LFTs 12/26/2012 . History of benign esophageal tumor  . Adenomatous colon polyp   . [place] DISEASE, LUMBOSACRAL SPINE 05/01/2010 . HYPERLIPIDEMIA 08/22/2009 . THYROID NODULE 08/16/2009 . Anxiety and depression 08/16/2009 . Essential hypertension 08/16/2009 . Gastroesophageal reflux disease 08/16/2009 . FIBROMYALGIA 08/16/2009 Thank you, Genene Churn, CCC-SLP [phone] [facility] 07/03/2020, 1:56 PM [place] [address] [place], [place], [address] Phone: [phone]   Fax:  [phone] Name: [name] MRN: [id number] Date of Birth: [birth year] CLINICAL DATA:  Dysphagia. EXAM: MODIFIED BARIUM SWALLOW TECHNIQUE: Different consistencies of barium were administered orally to the patient by the Speech Pathologist. Imaging of the pharynx was performed in the lateral projection. The radiologist was present in the fluoroscopy room for this study, providing personal supervision. FLUOROSCOPY TIME:  Fluoroscopy Time:  2 minutes 18 seconds Radiation Exposure Index (if provided by the fluoroscopic device): 15.8 mGy Number of Acquired Spot Images: multiple fluoroscopic screen captures COMPARISON:  None FINDINGS: Minimal vallecular and piriform sinus residuals of thin barium utilizing a straw. Swallows of thin barium were otherwise unremarkable without laryngeal penetration or aspiration. With apple sauce and cracker consistencies patient demonstrated impaired oral phase with difficulty in forming and maintaining of bolus. No laryngeal penetration, aspiration or residuals. Patient swallowed a 12.5 mm diameter barium tablet with thin liquid barium without difficulty, pill passing to stomach. Diffuse esophageal dysmotility noted. IMPRESSION: Impaired oral phase with applesauce and cracker consistencies. Minimal vallecular and piriform sinus residuals of thin barium by straw. Please refer to the Speech Pathologists report for complete details and recommendations. Electronically Signed   By: Lavonia Dana M.D.   On: 07/03/2020 12:08     Labs:  CBC: Recent Labs    10/14/19 0815 10/14/19 0836  HGB 13.9 12.2  HCT 41.0 36.0    COAGS: No results for input(s): INR, APTT in the last 8760 hours.  BMP: Recent Labs    10/11/19 1440 10/14/19 0815 10/14/19 0836  NA 138 136 136  K 3.0* 7.5* 4.0  CL 101 106 108  CO2 20*  --   --   GLUCOSE 102* 91 84  BUN 13 12 9   CALCIUM 9.9  --   --   CREATININE 0.94 0.90 0.80  GFRNONAA >60  --   --   GFRAA >60  --   --      Assessment and Plan:  Symptomatic T8 and T9 compression fractures. Discussed recent MR results with patient. Discussed nature of compression fractures. Discussed vertebral augmentation procedure (kyphoplasty/vertebroplasty) with patient, including risks, benefits, and indications. Explained goal is to stabilize her fractures and decrease her pain by at least 50%. At this time, patient wishes to go home and think about procedure/discuss procedure with family before agreeing to procedure.  Plan for follow-up with an image-guided T8/T9 kyphoplasty/vertebroplasty (first available) versus conservative management (including orthopedic follow-up). Instructed patient to call us once she has made her decision regarding which management option  to take. Card with IR schedulers numbers given to patient/sister.  All questions answered and concerns addressed. Patient and sister convey understanding and agree with plan.  Thank you for this interesting consult.  I greatly enjoyed meeting LEETA BUTTERLY and look forward to participating in their care.  A copy of this report was sent to the requesting provider on this date.  Electronically Signed: Earley Abide, PA-C 07/19/2020, 1:34 PM   I spent a total of 30 Minutes in face to face in clinical consultation, greater than 50% of which was counseling/coordinating care for T8 and T9 compression fractures/vertebral augmentation.

## 2020-07-26 ENCOUNTER — Ambulatory Visit (INDEPENDENT_AMBULATORY_CARE_PROVIDER_SITE_OTHER): Payer: Medicare Other | Admitting: Student

## 2020-07-26 ENCOUNTER — Other Ambulatory Visit: Payer: Self-pay

## 2020-07-26 ENCOUNTER — Encounter: Payer: Self-pay | Admitting: Student

## 2020-07-26 VITALS — BP 130/90 | HR 72 | Ht 62.0 in | Wt 185.0 lb

## 2020-07-26 DIAGNOSIS — R0609 Other forms of dyspnea: Secondary | ICD-10-CM

## 2020-07-26 DIAGNOSIS — R0600 Dyspnea, unspecified: Secondary | ICD-10-CM

## 2020-07-26 DIAGNOSIS — I10 Essential (primary) hypertension: Secondary | ICD-10-CM

## 2020-07-26 DIAGNOSIS — E782 Mixed hyperlipidemia: Secondary | ICD-10-CM | POA: Diagnosis not present

## 2020-07-26 DIAGNOSIS — R002 Palpitations: Secondary | ICD-10-CM | POA: Diagnosis not present

## 2020-07-26 NOTE — Patient Instructions (Signed)
Medication Instructions:  Your physician recommends that you continue on your current medications as directed. Please refer to the Current Medication list given to you today.  *If you need a refill on your cardiac medications before your next appointment, please call your pharmacy*   Lab Work: NONE   If you have labs (blood work) drawn today and your tests are completely normal, you will receive your results only by: Marland Kitchen MyChart Message (if you have MyChart) OR . A paper copy in the mail If you have any lab test that is abnormal or we need to change your treatment, we will call you to review the results.   Testing/Procedures: Call when you are ready to schedule your stress test.    Follow-Up: At [facility], you and your health needs are our priority.  As part of our continuing mission to provide you with exceptional heart care, we have created designated Provider Care Teams.  These Care Teams include your primary Cardiologist (physician) and Advanced Practice Providers (APPs -  Physician Assistants and Nurse Practitioners) who all work together to provide you with the care you need, when you need it.  We recommend signing up for the patient portal called "MyChart".  Sign up information is provided on this After Visit Summary.  MyChart is used to connect with patients for Virtual Visits (Telemedicine).  Patients are able to view lab/test results, encounter notes, upcoming appointments, etc.  Non-urgent messages can be sent to your provider as well.   To learn more about what you can do with MyChart, go to [email or web address].    Your next appointment:   6 month(s)  The format for your next appointment:   In Person  Provider:   Carlyle Dolly, MD or Bernerd Pho, PA-C   Other Instructions Thank you for choosing [facility]!

## 2020-07-26 NOTE — Progress Notes (Signed)
Cardiology Office Note    Date:  07/26/2020   ID:  [name], DOB [birth year], MRN [id number]  PCP:  Celene Squibb, MD  Cardiologist: Carlyle Dolly, MD    Chief Complaint  Patient presents with  . Follow-up    6 month visit    History of Present Illness:    [name] is a 75 y.o. female with past medical history of chronic diastolic CHF, HTN and HLD who presents to the office today for 64-month follow-up.   She was last examined by myself in 01/2020 and reported having baseline dyspnea on exertion but denied any chest pain or palpitations. Stress testing was reviewed given her multiple cardiac risk factors but she did not wish to pursue further evaluation at that time. She was on both Lopressor 12.5mg  BID and Atenolol 100mg  daily, therefore Lopressor was discontinued to avoid dual BB therapy and Atenolol was titrated to 125mg  daily.   In talking with the patient and her daughter today, she reports suffering spinal compression fractures in 05/2020 after a fall in her bathroom. She initially thought she was going to require surgical intervention but reports her pain has now resolved but she is wearing a back brace.  She continues to have dyspnea on exertion when performing routine activities around the house. Says she has not been as active as previously secondary to her compression fractures. She denies any associated chest pain. Her palpitations have improved following dose adjustment of Atenolol. No recent orthopnea, PND or lower extremity edema. She does consume fast food regularly.   Past Medical History:  Diagnosis Date  . Adenomatous colon polyp 2006   excised in 2006 & 2010Due surveillance 06/2013  . Anxiety   . Anxiety and depression   . Asthma   . Breast mass    right nipple bengn mass per patient  . Breast tumor   . Chest discomfort   . Chronic back pain   . Chronic constipation   . Complication of anesthesia   . Depression   . Diverticulosis   .  Fibromyalgia   . Gastroesophageal reflux disease   . Hiatal hernia   . History of benign esophageal tumor 2010   granular cell esophageal tumor (Dx 06/2008), resected via EMR 2011, due repeat EGD 02/2012  . Hyperlipidemia   . Hypertension   . Insomnia   . Migraines   . PONV (postoperative nausea and vomiting)   . Psychosis ([place])   . Sleep apnea    Stop Bang score of 5. Pt said she was told by Dr. Merlene Laughter that she had "a little bit" of sleep apena, but not bad enough to treat.  . Thyroid nodule   . Tumor of esophagus     Past Surgical History:  Procedure Laterality Date  . ABDOMINAL HYSTERECTOMY    . BIOPSY  10/14/2019   Procedure: BIOPSY;  Surgeon: Daneil Dolin, MD;  Location: [facility];  Service: Endoscopy;;  . BRAVO [place] STUDY  12/11/2011   Procedure: BRAVO [place];  Surgeon: Daneil Dolin, MD;  Location: [facility];  Service: Endoscopy;  Laterality: N/A;  . 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 CHOLECYSTECTOMY N/A 04/09/2013   Procedure: LAPAROSCOPIC CHOLECYSTECTOMY;  Surgeon: Jamesetta So, MD;  Location: [facility] ORS;  Service: General;  Laterality: N/A;  . COLONOSCOPY  06/2008, 06/2011   sigmoid tics, tubular adenoma; 2013: anal canal hemorrhoids  . COLONOSCOPY  07/10/2011  Anal canal hemorrhoids likely the cause of hematochezia in the setting of constipation; otherwise normal rectum ;submucosal  petechiae in left colon of doubtful clinical significance; otherwise, normal colon  . COLONOSCOPY WITH PROPOFOL N/A 11/10/2017   cancelled in pre-op  . COLONOSCOPY WITH PROPOFOL N/A 07/23/2018   Procedure: COLONOSCOPY WITH PROPOFOL;  Surgeon: Daneil Dolin, MD; 1 tubular adenoma, diverticulosis in the sigmoid and descending colon, nonbleeding internal hemorrhoids.  No recommendations to repeat due to age.  . ESOPHAGEAL DILATION N/A 05/08/2015   Procedure: ESOPHAGEAL DILATION;  Surgeon: Daneil Dolin, MD;  Location: [facility] OR[facility];  Service:  Endoscopy;  Laterality: N/AVenia Minks 54/56  . ESOPHAGOGASTRODUODENOSCOPY  12/11/2011   [id number] Schatzki's ring; otherwise normal/Small hiatal hernia. Antral and bulbar erosions  . ESOPHAGOGASTRODUODENOSCOPY (EGD) WITH PROPOFOL N/A 05/08/2015   Dr. Gala Romney: mild erosive reflux esophagitis, non-critical Schatzki's ring s/p dilation. Hiatal hernia.   Marland Kitchen ESOPHAGOGASTRODUODENOSCOPY (EGD) WITH PROPOFOL N/A 10/14/2019   Procedure: ESOPHAGOGASTRODUODENOSCOPY (EGD) WITH PROPOFOL;  Surgeon: Daneil Dolin, MD;  Nonobstructing Schatzki ring, small hiatal hernia, abnormal gastric mucosa s/p biopsied, normal first and second portion of the duodenum.  Pathology with chronic inactive gastritis, no H. pylori.  . EUS  08/2010   Dr [facility] with EGD. Retained food. No recurrent esophageal lesion, bx negative.  Marland Kitchen LUNG BIOPSY     negative  . PARATHYROIDECTOMY  April 2017   [facility].   Marland Kitchen PARATHYROIDECTOMY    . POLYPECTOMY  07/23/2018   Procedure: POLYPECTOMY;  Surgeon: Daneil Dolin, MD;  Location[facility];  Service: Endoscopy;;  colon  . RIGHT OOPHORECTOMY     benign disease  . SHOULDER ARTHROSCOPY     Right; bone spurs removed  . TONSILLECTOMY    . TOTAL KNEE ARTHROPLASTY  2002   Right; previous arthroscopic surgery    Current Medications: Outpatient Medications Prior to Visit  Medication Sig Dispense Refill  . AIMOVIG 70 MG/ML SOAJ Inject 1 Syringe into the skin every 30 (thirty) days.    Marland Kitchen albuterol (PROVENTIL HFA;VENTOLIN HFA) 108 (90 BASE) MCG/ACT inhaler Inhale 2 puffs into the lungs every 6 (six) hours as needed for wheezing or shortness of breath.     Marland Kitchen amLODipine (NORVASC) 10 MG tablet Take 10 mg by mouth daily.    Marland Kitchen atenolol (TENORMIN) 100 MG tablet Take 1 tablet (100 mg total) by mouth daily. 90 tablet 3  . atenolol (TENORMIN) 25 MG tablet Take 25 mg daily in addition to 100 mg daily for total dose of 125 mg daily 90 tablet 3  . CALCIUM-VITAMIN D PO Take 1 tablet by mouth daily.     . chlorthalidone (HYGROTON) 25 MG tablet Take 1/2 tablet by mouth daily. 45 tablet 2  . cycloSPORINE (RESTASIS) 0.05 % ophthalmic emulsion Place 1 drop into both eyes 2 (two) times daily.    . famotidine (PEPCID) 20 MG tablet Take 1 tablet (20 mg total) by mouth as needed for heartburn or indigestion. 60 tablet 3  . fluticasone (FLONASE) 50 MCG/ACT nasal spray Place 1 spray into both nostrils daily as needed for allergies.     . furosemide (LASIX) 40 MG tablet Take 40 mg daily as needed for swelling. 90 tablet 3  . gabapentin (NEURONTIN) 300 MG capsule Take 300 mg by mouth at bedtime.     Marland Kitchen HYDROcodone-acetaminophen (NORCO/VICODIN) 5-325 MG tablet hydrocodone 5 mg-acetaminophen 325 mg tablet  Take 1 tablet every day by oral route as needed.    . INGREZZA 80 MG  CAPS Take 1 capsule by mouth daily.    Marland Kitchen levothyroxine (SYNTHROID) 25 MCG tablet Take 25 mcg by mouth daily.    Marland Kitchen lovastatin (MEVACOR) 40 MG tablet Take 40 mg by mouth at bedtime.    . Melatonin 3 MG TABS Take 9 mg by mouth at bedtime.     . memantine (NAMENDA) 10 MG tablet Take 10 mg by mouth 2 (two) times daily.     Marland Kitchen neomycin-bacitracin-polymyxin (NEOSPORIN) [phone] ointment Apply 1 application topically as needed.     Marland Kitchen OLANZapine (ZYPREXA) 7.5 MG tablet Take 7.5 mg by mouth at bedtime.    . potassium chloride SA (KLOR-CON M20) 20 MEQ tablet Take 40 meq in the AM & 20 meq in the PM. (Patient taking differently: Take 20 mEq by mouth daily.) 270 tablet 3  . RABEprazole (ACIPHEX) 20 MG tablet Take 1 tablet by mouth daily before breakfast. (Patient taking differently: 20 mg 2 (two) times daily.) 30 tablet 10  . Semaglutide (RYBELSUS) 7 MG TABS Take by mouth.    . spironolactone (ALDACTONE) 25 MG tablet Take 25 mg by mouth daily.    . traZODone (DESYREL) 100 MG tablet Take 300 mg by mouth at bedtime.   3  . TRULANCE 3 MG TABS TAKE 1 TABLET BY MOUTH  DAILY 90 tablet 3  . venlafaxine XR (EFFEXOR-XR) 150 MG 24 hr capsule Take 150 mg by  mouth at bedtime.     Marland Kitchen venlafaxine XR (EFFEXOR-XR) 75 MG 24 hr capsule Take 75 mg by mouth at bedtime.     Marland Kitchen zolpidem (AMBIEN) 5 MG tablet zolpidem 5 mg tablet  Take 2 tablets every day by oral route.  1-2 q hs; discontinue trazodone     No facility-administered medications prior to visit.     Allergies:   Elavil [amitriptyline], Abilify [aripiprazole], Codeine, Latex, Penicillins, Polyethylene glycol, Sulfonamide derivatives, Cymbalta [duloxetine hcl], and Remeron [mirtazapine]   Social History   Socioeconomic History  . Marital status: Single    Spouse name: Not on file  . Number of children: 1  . Years of education: Not on file  . Highest education level: Not on file  Occupational History  . Occupation: disabled    Employer: RETIRED  Tobacco Use  . Smoking status: Never Smoker  . Smokeless tobacco: Never Used  Vaping Use  . Vaping Use: Never used  Substance and Sexual Activity  . Alcohol use: No    Alcohol/week: 0.0 standard drinks  . Drug use: No  . Sexual activity: Never  Other Topics Concern  . Not on file  Social History Narrative  . Not on file   Social Determinants of Health   Financial Resource Strain: Not on file  Food Insecurity: Not on file  Transportation Needs: Not on file  Physical Activity: Not on file  Stress: Not on file  Social Connections: Not on file     Family History:  The patient's family history includes Alcohol abuse in her father; Anxiety disorder in her mother; Colon cancer in her paternal aunt and paternal uncle; Dementia in her maternal uncle; Depression in her mother; Heart attack in her mother; Stroke in her father.   Review of Systems:   Please see the history of present illness.     General:  No chills, fever, night sweats or weight changes.  Cardiovascular:  No chest pain, edema, orthopnea, palpitations, paroxysmal nocturnal dyspnea. Positive for dyspnea on exertion.  Dermatological: No rash, lesions/masses Respiratory: No  cough, dyspnea Urologic:  No hematuria, dysuria Abdominal:   No nausea, vomiting, diarrhea, bright red blood per rectum, melena, or hematemesis Neurologic:  No visual changes, wkns, changes in mental status. All other systems reviewed and are otherwise negative except as noted above.   Physical Exam:    VS:  BP 130/90   Pulse 72   Ht 5\' 2"  (1.575 m)   Wt 185 lb (83.9 kg)   SpO2 96%   BMI 33.84 kg/m    General: Well developed, well nourished,female appearing in no acute distress. Head: Normocephalic, atraumatic. Neck: No carotid bruits. JVD not elevated.  Lungs: Respirations regular and unlabored, without wheezes or rales.  Heart: Regular rate and rhythm. No S3 or S4.  No murmur, no rubs, or gallops appreciated. Abdomen: Appears non-distended. No obvious abdominal masses. Msk:  Strength and tone appear normal for age. No obvious joint deformities or effusions. Extremities: No clubbing or cyanosis. Trace ankle edema bilaterally.  Distal pedal pulses are 2+ bilaterally. Neuro: Alert and oriented X 3. Moves all extremities spontaneously. No focal deficits noted. Psych:  Responds to questions appropriately with a normal affect. Skin: No rashes or lesions noted  Wt Readings from Last 3 Encounters:  07/26/20 185 lb (83.9 kg)  06/21/20 178 lb 12.8 oz (81.1 kg)  05/25/20 173 lb (78.5 kg)      Studies/Labs Reviewed:   EKG:  EKG is not ordered today.    Recent Labs: 10/14/2019: BUN 9; Creatinine, Ser 0.80; Hemoglobin 12.2; Potassium 4.0; Sodium 136   Lipid Panel    Component Value Date/Time   CHOL 147 06/01/2019 1045   TRIG 79 06/01/2019 1045   HDL 65 06/01/2019 1045   CHOLHDL 2.3 06/01/2019 1045   VLDL 16 06/01/2019 1045   LDLCALC 66 06/01/2019 1045    Additional studies/ records that were reviewed today include:   Echocardiogram: 11/2019 IMPRESSIONS    1. Left ventricular ejection fraction, by estimation, is 60 to 65%. The  left ventricle has normal function. The  left ventricle has no regional  wall motion abnormalities. Left ventricular diastolic parameters are  consistent with Grade I diastolic  dysfunction (impaired relaxation).  2. Right ventricular systolic function is normal. The right ventricular  size is normal. There is normal pulmonary artery systolic pressure.  3. The mitral valve is normal in structure. Mild mitral valve  regurgitation. No evidence of mitral stenosis.  4. The aortic valve is tricuspid. Aortic valve regurgitation is mild. No  aortic stenosis is present.  5. The inferior vena cava is normal in size with greater than 50%  respiratory variability, suggesting right atrial pressure of 3 mmHg.   Assessment:    1. Dyspnea on exertion   2. Essential hypertension   3. Palpitations   4. Mixed hyperlipidemia      Plan:   In order of problems listed above:  1. Dyspnea on Exertion - She continues to have dyspnea on exertion and feels like this might have slightly progressed since her last visit but she has not been as active as previously given her spinal compression fractures. She is now in agreement to proceed with stress testing as previously discussed but wishes to wait until she is out of her back brace. I encouraged her to call the office to make [place] aware when she wishes to proceed with this. Would plan for a Lexiscan Myoview as she is unable to walk on a treadmill.  2. HTN - BP is at 130/90 during today's visit. Continue current medication regimen with  Amlodipine 10 mg daily, Atenolol 125 mg daily, Chlorthalidone 12.5 mg daily and Spironolactone 25 mg daily.  3.  Palpitations - She reports her symptoms have overall been well controlled. Continue Atenolol 125 mg daily.  4. HLD - Followed by her PCP. Will request a copy of most recent records. She remains on Lovastatin 40 mg daily.    Shared Decision Making/Informed Consent:   Shared Decision Making/Informed Consent The risks [chest pain, shortness of breath,  cardiac arrhythmias, dizziness, blood pressure fluctuations, myocardial infarction, stroke/transient ischemic attack, nausea, vomiting, allergic reaction, radiation exposure, metallic taste sensation and life-threatening complications (estimated to be 1 in 10,000)], benefits (risk stratification, diagnosing coronary artery disease, treatment guidance) and alternatives of a nuclear stress test were discussed in detail with Ms. Perrier and she agrees to proceed.       Medication Adjustments/Labs and Tests Ordered: Current medicines are reviewed at length with the patient today.  Concerns regarding medicines are outlined above.  Medication changes, Labs and Tests ordered today are listed in the Patient Instructions below. Patient Instructions  Medication Instructions:  Your physician recommends that you continue on your current medications as directed. Please refer to the Current Medication list given to you today.  *If you need a refill on your cardiac medications before your next appointment, please call your pharmacy*   Lab Work: NONE   If you have labs (blood work) drawn today and your tests are completely normal, you will receive your results only by: Marland Kitchen MyChart Message (if you have MyChart) OR . A paper copy in the mail If you have any lab test that is abnormal or we need to change your treatment, we will call you to review the results.   Testing/Procedures: Call when you are ready to schedule your stress test.    Follow-Up: At [facility], you and your health needs are our priority.  As part of our continuing mission to provide you with exceptional heart care, we have created designated Provider Care Teams.  These Care Teams include your primary Cardiologist (physician) and Advanced Practice Providers (APPs -  Physician Assistants and Nurse Practitioners) who all work together to provide you with the care you need, when you need it.  We recommend signing up for the patient portal  called "MyChart".  Sign up information is provided on this After Visit Summary.  MyChart is used to connect with patients for Virtual Visits (Telemedicine).  Patients are able to view lab/test results, encounter notes, upcoming appointments, etc.  Non-urgent messages can be sent to your provider as well.   To learn more about what you can do with MyChart, go to [email or web address].    Your next appointment:   6 month(s)  The format for your next appointment:   In Person  Provider:   Carlyle Dolly, MD or Bernerd Pho, PA-C   Other Instructions Thank you for choosing [facility]!       Signed, Erma Heritage, PA-C  07/26/2020 5:41 PM    [place] [address] [place], [place] [address] Phone: [phone] Fax: [phone]

## 2020-08-15 ENCOUNTER — Telehealth: Payer: Self-pay | Admitting: Student

## 2020-08-15 NOTE — Telephone Encounter (Signed)
New message     Pt c/o swelling: STAT is pt has developed SOB within 24 hours  1) How much weight have you gained and in what time span?  Doesn't know   2) If swelling, where is the swelling located?feet  3) Are you currently taking a fluid pill?  Yes lasik and its not working   4) Are you currently SOB?  No   5) Do you have a log of your daily weights (if so, list)? No has been swollen in ankles and feet for 4-5 days   Have you gained 3 pounds in a day or 5 pounds in a week? ? 6) Have you traveled recently?  no

## 2020-08-15 NOTE — Telephone Encounter (Signed)
Pt reports that for the last 4 days her knees and feet have been swollen. The knees are painful but the feet are not. Patient reports that she does not weigh daily but did weigh twice this week. She states that she has not gained any weight. She denies SOB at this time. She states that she is taking lasix 40 mg for the last 4 days and it is not helping. Please advise.

## 2020-08-16 ENCOUNTER — Other Ambulatory Visit: Payer: Self-pay | Admitting: Gastroenterology

## 2020-08-16 DIAGNOSIS — R1310 Dysphagia, unspecified: Secondary | ICD-10-CM

## 2020-08-16 DIAGNOSIS — K219 Gastro-esophageal reflux disease without esophagitis: Secondary | ICD-10-CM

## 2020-08-16 NOTE — Telephone Encounter (Signed)
Spoke to patient and she verbalized understanding. Will increase lasix to 60 mg/daily for 3 days and update office Monday morning.

## 2020-08-16 NOTE — Telephone Encounter (Signed)
Incraese lasix to 60mg  daily x 3 days and then update [place]   Zandra Abts MD

## 2020-08-22 ENCOUNTER — Other Ambulatory Visit: Payer: Self-pay

## 2020-08-22 ENCOUNTER — Other Ambulatory Visit (HOSPITAL_COMMUNITY): Payer: Self-pay | Admitting: Neurology

## 2020-08-22 ENCOUNTER — Ambulatory Visit (HOSPITAL_COMMUNITY)
Admission: RE | Admit: 2020-08-22 | Discharge: 2020-08-22 | Disposition: A | Discharge status: Home / Self Care | Payer: Medicare Other | Source: Ambulatory Visit | Attending: Neurology | Admitting: Neurology

## 2020-08-22 DIAGNOSIS — M545 Low back pain, unspecified: Secondary | ICD-10-CM

## 2020-08-22 DIAGNOSIS — M546 Pain in thoracic spine: Secondary | ICD-10-CM | POA: Diagnosis present

## 2020-09-06 ENCOUNTER — Other Ambulatory Visit (HOSPITAL_COMMUNITY): Payer: Self-pay | Admitting: Interventional Radiology

## 2020-09-06 DIAGNOSIS — M549 Dorsalgia, unspecified: Secondary | ICD-10-CM

## 2020-09-11 ENCOUNTER — Ambulatory Visit (HOSPITAL_COMMUNITY)
Admission: RE | Admit: 2020-09-11 | Discharge: 2020-09-11 | Disposition: A | Discharge status: Home / Self Care | Payer: Medicare Other | Source: Ambulatory Visit | Attending: Interventional Radiology | Admitting: Interventional Radiology

## 2020-09-11 ENCOUNTER — Other Ambulatory Visit: Payer: Self-pay

## 2020-09-11 ENCOUNTER — Other Ambulatory Visit (HOSPITAL_COMMUNITY): Payer: Self-pay | Admitting: Interventional Radiology

## 2020-09-11 DIAGNOSIS — M549 Dorsalgia, unspecified: Secondary | ICD-10-CM

## 2020-09-12 ENCOUNTER — Other Ambulatory Visit (HOSPITAL_COMMUNITY): Payer: Self-pay | Admitting: Interventional Radiology

## 2020-09-12 ENCOUNTER — Telehealth (HOSPITAL_COMMUNITY): Payer: Self-pay

## 2020-09-12 DIAGNOSIS — S22070A Wedge compression fracture of T9-T10 vertebra, initial encounter for closed fracture: Secondary | ICD-10-CM

## 2020-09-12 DIAGNOSIS — M546 Pain in thoracic spine: Secondary | ICD-10-CM

## 2020-09-12 DIAGNOSIS — S22060A Wedge compression fracture of T7-T8 vertebra, initial encounter for closed fracture: Secondary | ICD-10-CM

## 2020-09-12 HISTORY — PX: IR RADIOLOGIST EVAL & MGMT: IMG5224

## 2020-09-12 NOTE — Telephone Encounter (Signed)
Called to schedule mri, no answer, left vm. AW 

## 2020-09-27 ENCOUNTER — Ambulatory Visit (HOSPITAL_COMMUNITY)
Admission: RE | Admit: 2020-09-27 | Discharge: 2020-09-27 | Disposition: A | Discharge status: Home / Self Care | Payer: Medicare Other | Source: Ambulatory Visit | Attending: Interventional Radiology | Admitting: Interventional Radiology

## 2020-09-27 DIAGNOSIS — S22070A Wedge compression fracture of T9-T10 vertebra, initial encounter for closed fracture: Secondary | ICD-10-CM | POA: Diagnosis present

## 2020-09-27 DIAGNOSIS — M546 Pain in thoracic spine: Secondary | ICD-10-CM | POA: Insufficient documentation

## 2020-09-27 DIAGNOSIS — S22060A Wedge compression fracture of T7-T8 vertebra, initial encounter for closed fracture: Secondary | ICD-10-CM | POA: Insufficient documentation

## 2020-10-04 ENCOUNTER — Telehealth (HOSPITAL_COMMUNITY): Payer: Self-pay

## 2020-10-04 ENCOUNTER — Other Ambulatory Visit (HOSPITAL_COMMUNITY): Payer: Self-pay | Admitting: Interventional Radiology

## 2020-10-04 DIAGNOSIS — S22060A Wedge compression fracture of T7-T8 vertebra, initial encounter for closed fracture: Secondary | ICD-10-CM

## 2020-10-04 DIAGNOSIS — S22070A Wedge compression fracture of T9-T10 vertebra, initial encounter for closed fracture: Secondary | ICD-10-CM

## 2020-10-04 NOTE — Telephone Encounter (Signed)
-----   Message from Ronney Lion, [place] sent at 09/29/2020  9:26 AM EDT ----- Marykay Lex guys,  Per Dr. Estanislado Pandy please schedule for an image-guided T8 (possible T9) KP/VP. Please schedule and please let me know if you have any questions.  Thanks! Radonna Ricker  ----- Message ----- From: Interface, Rad Results In Sent: 09/27/2020   3:53 PM EDT To: Luanne Bras, MD

## 2020-10-16 ENCOUNTER — Telehealth: Payer: Self-pay | Admitting: Orthopedic Surgery

## 2020-10-16 ENCOUNTER — Other Ambulatory Visit: Payer: Self-pay | Admitting: Radiology

## 2020-10-16 NOTE — Telephone Encounter (Signed)
No, do not need biopsy I called to advise

## 2020-10-16 NOTE — Telephone Encounter (Signed)
Amy,   Gildardo Pounds from Dr. Estanislado Pandy office called and left a voicemail wanting to know if Dr. Aline Brochure want's a biopsy on this patient.  She is having a kyphoplasty done tomorrow.    Please call her back at [phone]

## 2020-10-17 ENCOUNTER — Encounter (HOSPITAL_COMMUNITY): Payer: Self-pay

## 2020-10-17 ENCOUNTER — Other Ambulatory Visit (HOSPITAL_COMMUNITY): Payer: Self-pay | Admitting: Interventional Radiology

## 2020-10-17 ENCOUNTER — Other Ambulatory Visit: Payer: Self-pay

## 2020-10-17 ENCOUNTER — Ambulatory Visit (HOSPITAL_COMMUNITY)
Admission: RE | Admit: 2020-10-17 | Discharge: 2020-10-17 | Disposition: A | Discharge status: Home / Self Care | Payer: Medicare Other | Source: Ambulatory Visit | Attending: Interventional Radiology | Admitting: Interventional Radiology

## 2020-10-17 DIAGNOSIS — Z888 Allergy status to other drugs, medicaments and biological substances status: Secondary | ICD-10-CM | POA: Insufficient documentation

## 2020-10-17 DIAGNOSIS — Z882 Allergy status to sulfonamides status: Secondary | ICD-10-CM | POA: Insufficient documentation

## 2020-10-17 DIAGNOSIS — Z91040 Latex allergy status: Secondary | ICD-10-CM | POA: Insufficient documentation

## 2020-10-17 DIAGNOSIS — Z885 Allergy status to narcotic agent status: Secondary | ICD-10-CM | POA: Diagnosis not present

## 2020-10-17 DIAGNOSIS — S22000S Wedge compression fracture of unspecified thoracic vertebra, sequela: Secondary | ICD-10-CM | POA: Diagnosis present

## 2020-10-17 DIAGNOSIS — S22060A Wedge compression fracture of T7-T8 vertebra, initial encounter for closed fracture: Secondary | ICD-10-CM

## 2020-10-17 DIAGNOSIS — Z880 Allergy status to penicillin: Secondary | ICD-10-CM | POA: Insufficient documentation

## 2020-10-17 DIAGNOSIS — W19XXXS Unspecified fall, sequela: Secondary | ICD-10-CM | POA: Insufficient documentation

## 2020-10-17 DIAGNOSIS — S22070A Wedge compression fracture of T9-T10 vertebra, initial encounter for closed fracture: Secondary | ICD-10-CM

## 2020-10-17 DIAGNOSIS — Z79899 Other long term (current) drug therapy: Secondary | ICD-10-CM | POA: Insufficient documentation

## 2020-10-17 HISTORY — PX: IR KYPHO THORACIC WITH BONE BIOPSY: IMG5518

## 2020-10-17 LAB — CBC WITH DIFFERENTIAL/PLATELET
Abs Immature Granulocytes: 0.01 10*3/uL (ref 0.00–0.07)
Basophils Absolute: 0 10*3/uL (ref 0.0–0.1)
Basophils Relative: 0 %
Eosinophils Absolute: 0.1 10*3/uL (ref 0.0–0.5)
Eosinophils Relative: 2 %
HCT: 41.8 % (ref 36.0–46.0)
Hemoglobin: 13.5 g/dL (ref 12.0–15.0)
Immature Granulocytes: 0 %
Lymphocytes Relative: 24 %
Lymphs Abs: 1.3 10*3/uL (ref 0.7–4.0)
MCH: 27.4 pg (ref 26.0–34.0)
MCHC: 32.3 g/dL (ref 30.0–36.0)
MCV: 84.8 fL (ref 80.0–100.0)
Monocytes Absolute: 0.5 10*3/uL (ref 0.1–1.0)
Monocytes Relative: 9 %
Neutro Abs: 3.5 10*3/uL (ref 1.7–7.7)
Neutrophils Relative %: 65 %
Platelets: 162 10*3/uL (ref 150–400)
RBC: 4.93 MIL/uL (ref 3.87–5.11)
RDW: 14.6 % (ref 11.5–15.5)
WBC: 5.4 10*3/uL (ref 4.0–10.5)
nRBC: 0 % (ref 0.0–0.2)

## 2020-10-17 LAB — BASIC METABOLIC PANEL
Anion gap: 8 (ref 5–15)
BUN: 8 mg/dL (ref 8–23)
CO2: 26 mmol/L (ref 22–32)
Calcium: 9.5 mg/dL (ref 8.9–10.3)
Chloride: 104 mmol/L (ref 98–111)
Creatinine, Ser: 0.92 mg/dL (ref 0.44–1.00)
GFR, Estimated: >60 mL/min (ref >60)
Glucose, Bld: 107 mg/dL — ABNORMAL HIGH (ref 70–99)
Potassium: 3.1 mmol/L — ABNORMAL LOW (ref 3.5–5.1)
Sodium: 138 mmol/L (ref 135–145)

## 2020-10-17 LAB — PROTIME-INR
INR: 1 (ref 0.8–1.2)
Prothrombin Time: 13.1 seconds (ref 11.4–15.2)

## 2020-10-17 LAB — APTT: aPTT: 31 seconds (ref 24–36)

## 2020-10-17 MED ORDER — TOBRAMYCIN SULFATE 1.2 G IJ SOLR
INTRAMUSCULAR | Status: AC
  Filled 2020-10-17: qty 1.2

## 2020-10-17 MED ORDER — FENTANYL CITRATE (PF) 100 MCG/2ML IJ SOLN
INTRAMUSCULAR | Status: AC
  Filled 2020-10-17: qty 2

## 2020-10-17 MED ORDER — BUPIVACAINE HCL (PF) 0.5 % IJ SOLN
INTRAMUSCULAR | Status: AC
  Filled 2020-10-17: qty 30

## 2020-10-17 MED ORDER — VANCOMYCIN HCL IN DEXTROSE 1-5 GM/200ML-% IV SOLN
INTRAVENOUS | Status: AC
  Administered 2020-10-17: 1000 mg via INTRAVENOUS
  Filled 2020-10-17: qty 200

## 2020-10-17 MED ORDER — SODIUM CHLORIDE 0.9 % IV SOLN: INTRAVENOUS | Status: AC

## 2020-10-17 MED ORDER — TOBRAMYCIN SULFATE 1.2 G IJ SOLR
INTRAMUSCULAR | Status: AC | PRN
  Administered 2020-10-17: 1.2 g

## 2020-10-17 MED ORDER — VANCOMYCIN HCL IN DEXTROSE 1-5 GM/200ML-% IV SOLN: 1000.0000 mg | INTRAVENOUS | Status: AC

## 2020-10-17 MED ORDER — FENTANYL CITRATE (PF) 100 MCG/2ML IJ SOLN
INTRAMUSCULAR | Status: AC | PRN
  Administered 2020-10-17 (×2): 25 ug via INTRAVENOUS

## 2020-10-17 MED ORDER — BUPIVACAINE HCL (PF) 0.5 % IJ SOLN
INTRAMUSCULAR | Status: AC | PRN
  Administered 2020-10-17: 20 mL

## 2020-10-17 MED ORDER — MIDAZOLAM HCL 2 MG/2ML IJ SOLN
INTRAMUSCULAR | Status: AC
  Filled 2020-10-17: qty 2

## 2020-10-17 MED ORDER — IOHEXOL 300 MG/ML  SOLN: 50.0000 mL | Freq: Once | INTRAMUSCULAR | Status: DC | PRN

## 2020-10-17 MED ORDER — SODIUM CHLORIDE 0.9 % IV SOLN: INTRAVENOUS | Status: DC

## 2020-10-17 MED ORDER — GELATIN ABSORBABLE 12-7 MM EX MISC
CUTANEOUS | Status: AC
  Filled 2020-10-17: qty 1

## 2020-10-17 MED ORDER — MIDAZOLAM HCL 2 MG/2ML IJ SOLN
INTRAMUSCULAR | Status: AC | PRN
  Administered 2020-10-17 (×2): 1 mg via INTRAVENOUS

## 2020-10-17 NOTE — H&P (Signed)
Chief Complaint: Patient was seen in consultation today for Thoracic 8 and possibly Thoracic 9 at the request of  Dr Norton Blizzard   Supervising Physician: Luanne Bras  Patient Status: [facility] - Out-pt  History of Present Illness: [name] is a 75 y.o. female   Pt was seen initially Jan 2022 with Dr Estanislado Pandy for consultation She had suffered several falls at home and did have MR performed 06/13/20: Compression fracture of the inferior endplate of T8 and superior endplate of T9 resulting in approximately 50% and 35% loss of vertebral body height, respectively, without significant retropulsion. Associated marrow edema is consistent with acute/subacute fracture.  She chose not to move forward with procedure at that time -- said pain actually subsided to where it was tolerable  Now pain has increased and worsening Pain meds are no relief.  MR 09/27/20:  IMPRESSION: 1. Redemonstration of compression fractures of the T8 and T9 vertebral bodies with near resolution of marrow edema at the T9 level and residual marrow edema at the T8 level. Progression of the T8 vertebral body height loss, now with approximately 60% loss compared to 50% on prior. No retropulsion. 2. No new vertebral body compression fracture.  Scheduled now for T8 and possible T9 KP   Past Medical History:  Diagnosis Date  . Adenomatous colon polyp 2006   excised in 2006 & 2010Due surveillance 06/2013  . Anxiety   . Anxiety and depression   . Asthma   . Breast mass    right nipple bengn mass per patient  . Breast tumor   . Chest discomfort   . Chronic back pain   . Chronic constipation   . Complication of anesthesia   . Depression   . Diverticulosis   . Fibromyalgia   . Gastroesophageal reflux disease   . Hiatal hernia   . History of benign esophageal tumor 2010   granular cell esophageal tumor (Dx 06/2008), resected via EMR 2011, due repeat EGD 02/2012  . Hyperlipidemia   . Hypertension   .  Insomnia   . Migraines   . PONV (postoperative nausea and vomiting)   . Psychosis ([place])   . Sleep apnea    Stop Bang score of 5. Pt said she was told by Dr. Merlene Laughter that she had "a little bit" of sleep apena, but not bad enough to treat.  . Thyroid nodule   . Tumor of esophagus     Past Surgical History:  Procedure Laterality Date  . ABDOMINAL HYSTERECTOMY    . BIOPSY  10/14/2019   Procedure: BIOPSY;  Surgeon: Daneil Dolin, MD;  Location: [facility] ENDO SUITE;  Service: Endoscopy;;  . BRAVO [place] STUDY  12/11/2011   Procedure: BRAVO [place];  Surgeon: Daneil Dolin, MD;  Location: [facility] ENDO SUITE;  Service: Endoscopy;  Laterality: N/A;  . 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 CHOLECYSTECTOMY N/A 04/09/2013   Procedure: LAPAROSCOPIC CHOLECYSTECTOMY;  Surgeon: Jamesetta So, MD;  Location: [facility] ORS;  Service: General;  Laterality: N/A;  . COLONOSCOPY  06/2008, 06/2011   sigmoid tics, tubular adenoma; 2013: anal canal hemorrhoids  . COLONOSCOPY  07/10/2011   Anal canal hemorrhoids likely the cause of hematochezia in the setting of constipation; otherwise normal rectum ;submucosal  petechiae in left colon of doubtful clinical significance; otherwise, normal colon  . COLONOSCOPY WITH PROPOFOL N/A 11/10/2017   cancelled in pre-op  . COLONOSCOPY WITH PROPOFOL N/A 07/23/2018   Procedure: COLONOSCOPY  WITH PROPOFOL;  Surgeon: Daneil Dolin, MD; 1 tubular adenoma, diverticulosis in the sigmoid and descending colon, nonbleeding internal hemorrhoids.  No recommendations to repeat due to age.  . ESOPHAGEAL DILATION N/A 05/08/2015   Procedure: ESOPHAGEAL DILATION;  Surgeon: Daneil Dolin, MD;  Location: [facility] ORS;  Service: Endoscopy;  Laterality: N/AVenia Minks 54/56  . ESOPHAGOGASTRODUODENOSCOPY  12/11/2011   JIR:[id number] Schatzki's ring; otherwise normal/Small hiatal hernia. Antral and bulbar erosions  . ESOPHAGOGASTRODUODENOSCOPY (EGD) WITH PROPOFOL N/A  05/08/2015   Dr. Gala Romney: mild erosive reflux esophagitis, non-critical Schatzki's ring s/p dilation. Hiatal hernia.   Marland Kitchen ESOPHAGOGASTRODUODENOSCOPY (EGD) WITH PROPOFOL N/A 10/14/2019   Procedure: ESOPHAGOGASTRODUODENOSCOPY (EGD) WITH PROPOFOL;  Surgeon: Daneil Dolin, MD;  Nonobstructing Schatzki ring, small hiatal hernia, abnormal gastric mucosa s/p biopsied, normal first and second portion of the duodenum.  Pathology with chronic inactive gastritis, no H. pylori.  . EUS  08/2010   Dr [facility] with EGD. Retained food. No recurrent esophageal lesion, bx negative.  . IR RADIOLOGIST EVAL & MGMT  09/12/2020  . LUNG BIOPSY     negative  . PARATHYROIDECTOMY  April 2017   [facility].   Marland Kitchen PARATHYROIDECTOMY    . POLYPECTOMY  07/23/2018   Procedure: POLYPECTOMY;  Surgeon: Daneil Dolin, MD;  Location: [facility];  Service: Endoscopy;;  colon  . RIGHT OOPHORECTOMY     benign disease  . SHOULDER ARTHROSCOPY     Right; bone spurs removed  . TONSILLECTOMY    . TOTAL KNEE ARTHROPLASTY  2002   Right; previous arthroscopic surgery    Allergies: Elavil [amitriptyline], Abilify [aripiprazole], Codeine, Latex, Penicillins, Polyethylene glycol, Sulfonamide derivatives, Cymbalta [duloxetine hcl], and Remeron [mirtazapine]  Medications: Prior to Admission medications   Medication Sig Start Date End Date Taking? Authorizing Provider  AIMOVIG 70 MG/ML SOAJ Inject 70 mg into the skin every 30 (thirty) days. 05/23/20  Yes [provider]  amLODipine (NORVASC) 10 MG tablet Take 10 mg by mouth daily. 12/18/15  Yes [provider]  Aspirin-Acetaminophen-Caffeine (GOODY HEADACHE PO) Take 1 packet by mouth daily as needed (headaches/pain).   Yes [provider]  atenolol (TENORMIN) 100 MG tablet Take 1 tablet (100 mg total) by mouth daily. Patient taking differently: Take 100 mg by mouth daily. Take with 25 mg to equal 125 mg daily 02/09/20  Yes Strader, [place] M, PA-C  atenolol  (TENORMIN) 25 MG tablet Take 25 mg daily in addition to 100 mg daily for total dose of 125 mg daily Patient taking differently: Take 25 mg by mouth daily. Take with 100 mg to equal 125 mg daily 02/09/20  Yes Strader, [place] M, PA-C  calcium carbonate (TUMS - DOSED IN MG ELEMENTAL CALCIUM) 500 MG chewable tablet Chew 1,500 mg by mouth daily as needed for indigestion or heartburn.   Yes [provider]  CALCIUM-VITAMIN D PO Take 1 tablet by mouth daily.   Yes [provider]  cetirizine (ZYRTEC) 10 MG tablet Take 10 mg by mouth daily as needed for allergies.   Yes [provider]  chlorthalidone (HYGROTON) 25 MG tablet Take 1/2 tablet by mouth daily. Patient taking differently: Take 12.5 mg by mouth daily. 11/02/19  Yes BranchAlphonse Guild, MD  famotidine (PEPCID) 20 MG tablet Take 1 tablet (20 mg total) by mouth as needed for heartburn or indigestion. 06/21/20  Yes Erenest Rasher, PA-C  ferrous sulfate 325 (65 FE) MG tablet Take 325 mg by mouth daily with breakfast.   Yes [provider]  fluticasone (FLONASE) 50 MCG/ACT nasal spray Place 1 spray into both nostrils daily as needed for allergies.    Yes [provider]  furosemide (LASIX) 40 MG tablet Take 40 mg daily as needed for swelling. Patient taking differently: Take 40 mg by mouth daily as needed for edema. 06/01/19  Yes BranchAlphonse Guild, MD  gabapentin (NEURONTIN) 100 MG capsule Take 100 mg by mouth 3 (three) times daily.   Yes [provider]  gabapentin (NEURONTIN) 300 MG capsule Take 300 mg by mouth at bedtime.   Yes [provider]  HYDROcodone-acetaminophen (NORCO/VICODIN) 5-325 MG tablet Take 1 tablet by mouth 2 (two) times daily as needed for moderate pain.   Yes [provider]  hydrOXYzine (ATARAX/VISTARIL) 25 MG tablet Take 25 mg by mouth daily.   Yes [provider]  INGREZZA 80 MG CAPS Take 80 mg by mouth daily. 04/28/20  Yes [provider]   lovastatin (MEVACOR) 40 MG tablet Take 40 mg by mouth at bedtime.   Yes [provider]  Melatonin 3 MG TABS Take 9 mg by mouth at bedtime.    Yes [provider]  memantine (NAMENDA) 10 MG tablet Take 10 mg by mouth 2 (two) times daily.  10/20/17  Yes [provider]  metoprolol tartrate (LOPRESSOR) 25 MG tablet Take 12.5 mg by mouth 2 (two) times daily.   Yes [provider]  OLANZapine (ZYPREXA) 7.5 MG tablet Take 7.5 mg by mouth at bedtime.   Yes [provider]  potassium chloride SA (KLOR-CON M20) 20 MEQ tablet Take 40 meq in the AM & 20 meq in the PM. Patient taking differently: Take 20 mEq by mouth 2 (two) times daily. 10/13/19  Yes Branch, Alphonse Guild, MD  RABEprazole (ACIPHEX) 20 MG tablet Take 1 tablet (20 mg total) by mouth 2 (two) times daily. 08/21/20  Yes Harper, Kristen S, PA-C  Semaglutide (RYBELSUS) 7 MG TABS Take 7 mg by mouth daily.   Yes [provider]  traZODone (DESYREL) 100 MG tablet Take 300 mg by mouth at bedtime.  07/31/17  Yes [provider]  TRULANCE 3 MG TABS TAKE 1 TABLET BY MOUTH  DAILY Patient taking differently: Take 3 mg by mouth daily. 07/03/20  Yes [name], NP  venlafaxine XR (EFFEXOR-XR) 150 MG 24 hr capsule Take 150 mg by mouth at bedtime. Take with 75 mg to equal 225 mg at bedtime   Yes [provider]  venlafaxine XR (EFFEXOR-XR) 75 MG 24 hr capsule Take 75 mg by mouth at bedtime. Take with 150 mg to equal 225 mg at bedtime   Yes [provider]  zolpidem (AMBIEN) 10 MG tablet Take 10 mg by mouth at bedtime. 09/05/20  Yes [provider]  albuterol (PROVENTIL HFA;VENTOLIN HFA) 108 (90 BASE) MCG/ACT inhaler Inhale 2 puffs into the lungs every 6 (six) hours as needed for wheezing or shortness of breath.     [provider]  cycloSPORINE (RESTASIS) 0.05 % ophthalmic emulsion Place 1 drop into both eyes 2 (two) times daily as needed (dry eyes).    [provider]  neomycin-bacitracin-polymyxin (NEOSPORIN) [phone] ointment Apply 1 application topically as needed (wound care).    [provider]     Family History  Problem Relation Age of Onset  . Stroke Father   . Alcohol abuse Father   . Heart attack Mother   . Depression Mother   . Anxiety disorder Mother   .  Colon cancer Paternal Aunt   . Colon cancer Paternal Uncle   . Dementia Maternal Uncle   . ADD / ADHD Neg Hx   . Bipolar disorder Neg Hx   . Drug abuse Neg Hx   . OCD Neg Hx   . Paranoid behavior Neg Hx   . Schizophrenia Neg Hx   . Seizures Neg Hx   . Sexual abuse Neg Hx   . Physical abuse Neg Hx     Social History   Socioeconomic History  . Marital status: Single    Spouse name: Not on file  . Number of children: 1  . Years of education: Not on file  . Highest education level: Not on file  Occupational History  . Occupation: disabled    Employer: RETIRED  Tobacco Use  . Smoking status: Never Smoker  . Smokeless tobacco: Never Used  Vaping Use  . Vaping Use: Never used  Substance and Sexual Activity  . Alcohol use: No    Alcohol/week: 0.0 standard drinks  . Drug use: No  . Sexual activity: Never  Other Topics Concern  . Not on file  Social History Narrative  . Not on file   Social Determinants of Health   Financial Resource Strain: Not on file  Food Insecurity: Not on file  Transportation Needs: Not on file  Physical Activity: Not on file  Stress: Not on file  Social Connections: Not on file     Review of Systems: A 12 point ROS discussed and pertinent positives are indicated in the HPI above.  All other systems are negative.  Review of Systems  Vital Signs: BP (!) 143/88   Pulse 60   Temp 98.7 F (37.1 C)   Ht 5\' 2"  (1.575 m)   Wt 185 lb (83.9 kg)   SpO2 97%   BMI 33.84 kg/m   Physical Exam  Imaging: MR THORACIC SPINE WO CONTRAST  Result Date: 09/27/2020 CLINICAL DATA:  Compression fracture of T8 and T9  vertebrae. Thoracic back pain. EXAM: MRI THORACIC SPINE WITHOUT CONTRAST TECHNIQUE: Multiplanar, multisequence MR imaging of the thoracic spine was performed. No intravenous contrast was administered. COMPARISON:  MRI of the thoracic spine June 13, 2020. FINDINGS: Alignment: Dextroconvex scoliosis of the lumbar spine. Kyphotic deformity centered at T8. Vertebrae: Again seen are compression fractures of the T8 and T9 vertebral bodies with near resolution of the marrow edema at the T9 level and residual marrow edema at the T8 level. Progression of the T8 vertebral body height loss, now with approximately 60% loss compared to 50% on prior. No retropulsion. No new vertebral body compression fracture. No evidence of discitis or aggressive bone lesion. Cord:  Normal signal and morphology. Paraspinal and other soft tissues: Negative. Disc levels: Small posterior disc protrusions from T6-7 through T12-L1, more pronounced at T8-9 where there is small indentation of the thecal sac without significant spinal canal stenosis. No high-grade spinal canal or neural foraminal stenosis at any thoracic level. IMPRESSION: 1. Redemonstration of compression fractures of the T8 and T9 vertebral bodies with near resolution of marrow edema at the T9 level and residual marrow edema at the T8 level. Progression of the T8 vertebral body height loss, now with approximately 60% loss compared to 50% on prior. No retropulsion. 2. No new vertebral body compression fracture. 3. Mild thoracic spondylosis without high-grade spinal canal or neural foraminal stenosis at any thoracic level. Electronically Signed   By: Pedro Earls M.D.   On: 09/27/2020 15:51  Labs:  CBC: Recent Labs    10/17/20 0745  WBC 5.4  HGB 13.5  HCT 41.8  PLT 162    COAGS: Recent Labs    10/17/20 0745  INR 1.0  APTT 31    BMP: No results for input(s): NA, K, CL, CO2, GLUCOSE, BUN, CALCIUM, CREATININE, GFRNONAA, GFRAA in the last 8760  hours.  Invalid input(s): CMP  LIVER FUNCTION TESTS: No results for input(s): BILITOT, AST, ALT, ALKPHOS, PROT, ALBUMIN in the last 8760 hours.  TUMOR MARKERS: No results for input(s): AFPTM, CEA, CA199, CHROMGRNA in the last 8760 hours.  Assessment and Plan:  Painful Thoracic 8 and Thoracic 9 fracture Scheduled today for T8 and possible T9 Kyphoplasty Risks and benefits of Thoracic 8 and possible thoracic 9 kyphoplasty were discussed with the patient including, but not limited to education regarding the natural healing process of compression fractures without intervention, bleeding, infection, cement migration which may cause spinal cord damage, paralysis, pulmonary embolism or even death.  This interventional procedure involves the use of X-rays and because of the nature of the planned procedure, it is possible that we will have prolonged use of X-ray fluoroscopy.  Potential radiation risks to you include (but are not limited to) the following: - A slightly elevated risk for cancer  several years later in life. This risk is typically less than 0.5% percent. This risk is low in comparison to the normal incidence of human cancer, which is 33% for women and 50% for men according to the [place]. - Radiation induced injury can include skin redness, resembling a rash, tissue breakdown / ulcers and hair loss (which can be temporary or permanent).   The likelihood of either of these occurring depends on the difficulty of the procedure and whether you are sensitive to radiation due to previous procedures, disease, or genetic conditions.   IF your procedure requires a prolonged use of radiation, you will be notified and given written instructions for further action.  It is your responsibility to monitor the irradiated area for the 2 weeks following the procedure and to notify your physician if you are concerned that you have suffered a radiation induced injury.    All of the  patient's questions were answered, patient is agreeable to proceed.  Consent signed and in chart.  Thank you for this interesting consult.  I greatly enjoyed meeting [name] and look forward to participating in their care.  A copy of this report was sent to the requesting provider on this date.  Electronically Signed: Lavonia Drafts, PA-C 10/17/2020, 9:37 AM   I spent a total of  30 Minutes   in face to face in clinical consultation, greater than 50% of which was counseling/coordinating care for T8/9 KP

## 2020-10-17 NOTE — Discharge Instructions (Signed)
1.No stooping,bending or lifting more than 10 lbs for 2 weks. 2.Use walker to ambulate for 2 weeks. 3.Refrain from driving for 2 weeks KYPHOPLASTY/VERTEBROPLASTY DISCHARGE INSTRUCTIONS  Medications: (check all that apply)     Resume all home medications as before procedure.       Resume your (aspirin/Plavix/Coumadin) on .                  Continue your pain medications as prescribed as needed.  Over the next 3-5 days, decrease your pain medication as tolerated.  Over the counter medications (i.e. Tylenol, ibuprofen, and aleve) may be substituted once severe/moderate pain symptoms have subsided.   Wound Care: - Bandages may be removed the day following your procedure.  You may get your incision wet once bandages are removed.  Bandaids may be used to cover the incisions until scab formation.  Topical ointments are optional.  - If you develop a fever greater than 101 degrees, have increased skin redness at the incision sites or pus-like oozing from incisions occurring within 1 week of the procedure, contact radiology at [phone] or [phone].  - Ice pack to back for 15-20 minutes 2-3 time per day for first 2-3 days post procedure.  The ice will expedite muscle healing and help with the pain from the incisions.   Activity: - Bedrest today with limited activity for 24 hours post procedure.  - No driving for 48 hours.  - Increase your activity as tolerated after bedrest (with assistance if necessary).  - Refrain from any strenuous activity or heavy lifting (greater than 10 lbs.).   Follow up: - Contact radiology at [phone] or [phone] if any questions/concerns.  - A physician assistant from radiology will contact you in approximately 1 week.  - If a biopsy was performed at the time of your procedure, your referring physician should receive the results in usually 2-3 days.

## 2020-10-17 NOTE — Sedation Documentation (Signed)
Attempted to call report to short stay, they will call back.

## 2020-10-17 NOTE — Procedures (Signed)
S/P T 8 balloon KP. S.Shaketha Jeon MD

## 2020-10-17 NOTE — Sedation Documentation (Signed)
Attempted to call short stay a second time to give report, was told they will call back.

## 2020-10-17 NOTE — Sedation Documentation (Signed)
Report given to Shilo, RN in short stay.

## 2020-10-18 ENCOUNTER — Ambulatory Visit (INDEPENDENT_AMBULATORY_CARE_PROVIDER_SITE_OTHER): Payer: Medicare Other | Admitting: Orthopedic Surgery

## 2020-10-18 ENCOUNTER — Encounter: Payer: Self-pay | Admitting: Orthopedic Surgery

## 2020-10-18 VITALS — Ht 62.0 in | Wt 185.0 lb

## 2020-10-18 DIAGNOSIS — M7052 Other bursitis of knee, left knee: Secondary | ICD-10-CM | POA: Diagnosis not present

## 2020-10-18 NOTE — Patient Instructions (Signed)

## 2020-10-18 NOTE — Progress Notes (Signed)
Chief Complaint  Patient presents with  . Knee Pain    Lt knee pain getting worse past 2-3 months   Body mass index is 33.84 kg/m.  Encounter Diagnosis  Name Primary?  . Pes anserinus bursitis of left knee Yes    75 year old female status post recent vertebroplasty complains of medial left knee pain.  She has osteoarthritis of the left knee has had injections in the joint for but her pain today is below the joint medial side in line with the pes bursa  On exam she has point tenderness on the pes bursa the joint line is nontender she has a passive range of motion is 0-110  She is ambulatory with a walker  We injected the left knee at the pes bursa  Procedure injection left knee  Site of injection pes bursa  Medication Celestone 6 mg and lidocaine 1% 2 cc  Injection was performed after preparing the skin with ethyl chloride and alcohol and was successful  Patient will follow-up in 3 months

## 2020-10-24 ENCOUNTER — Encounter: Payer: Self-pay | Admitting: Internal Medicine

## 2020-10-30 ENCOUNTER — Other Ambulatory Visit: Payer: Self-pay | Admitting: Cardiology

## 2020-11-28 ENCOUNTER — Other Ambulatory Visit: Payer: Self-pay | Admitting: Gastroenterology

## 2020-11-28 ENCOUNTER — Telehealth: Payer: Self-pay

## 2020-11-28 DIAGNOSIS — R1310 Dysphagia, unspecified: Secondary | ICD-10-CM

## 2020-11-28 DIAGNOSIS — K219 Gastro-esophageal reflux disease without esophagitis: Secondary | ICD-10-CM

## 2020-11-28 NOTE — Telephone Encounter (Signed)
Patient should be taking rabeprazole 20 mg twice daily.  I sent in a 45-month supply in February.  I can send in additional refills if needed.  Please verify what pharmacy I should send this to.

## 2020-11-28 NOTE — Progress Notes (Signed)
Error

## 2020-11-28 NOTE — Telephone Encounter (Signed)
Pt request refill on Rabeprazole 20 mg tab sig: 1 tab po daily before breaksfast

## 2020-11-29 NOTE — Telephone Encounter (Signed)
noted 

## 2020-11-29 NOTE — Telephone Encounter (Signed)
Noted. Will wait for refill request.

## 2020-11-29 NOTE — Telephone Encounter (Signed)
I phoned the pt back and she advised me that she didn't need a refill and when she do she will just go pick some up at the drug store. I told her this wad a Rx and she would need to have it filled and she just couldn't pick it up. She also stated that she still use the same pharmacy that it had not changed.

## 2020-11-29 NOTE — Telephone Encounter (Signed)
Phoned and LMOVM for the pt to return call 

## 2020-12-12 ENCOUNTER — Other Ambulatory Visit (HOSPITAL_COMMUNITY): Payer: Self-pay | Admitting: Neurology

## 2020-12-12 DIAGNOSIS — M546 Pain in thoracic spine: Secondary | ICD-10-CM

## 2020-12-14 ENCOUNTER — Other Ambulatory Visit: Payer: Self-pay | Admitting: Gastroenterology

## 2020-12-14 ENCOUNTER — Telehealth: Payer: Self-pay

## 2020-12-14 DIAGNOSIS — K219 Gastro-esophageal reflux disease without esophagitis: Secondary | ICD-10-CM

## 2020-12-14 DIAGNOSIS — R1310 Dysphagia, unspecified: Secondary | ICD-10-CM

## 2020-12-14 MED ORDER — RABEPRAZOLE SODIUM 20 MG PO TBEC: 20.0000 mg | DELAYED_RELEASE_TABLET | Freq: Two times a day (BID) | ORAL | 5 refills | Status: DC

## 2020-12-14 NOTE — Telephone Encounter (Signed)
This pt wants here Rabeprazole 20 mg DR tab to be sent to [place] by [employer]. She last filled this on June 5th. The company sent over a notice on how to send them an [place].  I will add this to the folder I have for you.

## 2020-12-14 NOTE — Telephone Encounter (Signed)
Prescription for rabeprazole 20 mg twice daily before meals was sent to pill pack by [facility].

## 2020-12-15 NOTE — Telephone Encounter (Signed)
The Rx for pt's Rabeprazole has been sent to [facility] by [facility] per pt's request.

## 2020-12-18 NOTE — Telephone Encounter (Signed)
noted 

## 2020-12-20 ENCOUNTER — Telehealth: Payer: Self-pay

## 2020-12-20 ENCOUNTER — Ambulatory Visit: Payer: Medicare Other | Admitting: Gastroenterology

## 2020-12-20 NOTE — Telephone Encounter (Signed)
Pt's pharmacy ([facility]) [name] regarding pt needing refill on her Rabeprazole 20 mg. I returned phone call and advised it was already there with 5 refills dated 12/14/2020. Error on their part (they overlooked it).

## 2020-12-21 DIAGNOSIS — N1831 Chronic kidney disease, stage 3a: Secondary | ICD-10-CM | POA: Insufficient documentation

## 2020-12-26 ENCOUNTER — Other Ambulatory Visit: Payer: Self-pay | Admitting: Cardiology

## 2020-12-28 ENCOUNTER — Other Ambulatory Visit: Payer: Self-pay | Admitting: Family Medicine

## 2020-12-28 ENCOUNTER — Other Ambulatory Visit (HOSPITAL_COMMUNITY): Payer: Self-pay | Admitting: Internal Medicine

## 2020-12-28 ENCOUNTER — Telehealth: Payer: Self-pay | Admitting: Cardiology

## 2020-12-28 ENCOUNTER — Other Ambulatory Visit (HOSPITAL_COMMUNITY): Payer: Self-pay | Admitting: Family Medicine

## 2020-12-28 DIAGNOSIS — R609 Edema, unspecified: Secondary | ICD-10-CM | POA: Diagnosis not present

## 2020-12-28 DIAGNOSIS — Z1231 Encounter for screening mammogram for malignant neoplasm of breast: Secondary | ICD-10-CM

## 2020-12-28 DIAGNOSIS — G252 Other specified forms of tremor: Secondary | ICD-10-CM | POA: Diagnosis not present

## 2020-12-28 DIAGNOSIS — G2401 Drug induced subacute dyskinesia: Secondary | ICD-10-CM | POA: Diagnosis not present

## 2020-12-28 DIAGNOSIS — K117 Disturbances of salivary secretion: Secondary | ICD-10-CM | POA: Diagnosis not present

## 2020-12-28 DIAGNOSIS — I509 Heart failure, unspecified: Secondary | ICD-10-CM | POA: Diagnosis not present

## 2020-12-28 NOTE — Telephone Encounter (Signed)
New message   Pt c/o swelling: STAT is pt has developed SOB within 24 hours  If swelling, where is the swelling located legs and feet   How much weight have you gained and in what time span 20lbs in 2 months   Have you gained 3 pounds in a day or 5 pounds in a week? Doesn't know  Do you have a log of your daily weights (if so, list)? Doesn't track   Are you currently taking a fluid pill? Yes lasik  - 120mg  a day  Are you currently SOB? no  Have you traveled recently? no

## 2020-12-28 NOTE — Telephone Encounter (Signed)
How long has she been on the lasix 60mg  bid. If over 5 days and ongoing swelling would increase to 80mg  bid x 4 days and update [place] on MOnday on weights and edema  Zandra Abts MD

## 2020-12-28 NOTE — Telephone Encounter (Signed)
Spoke with pt who states that she has had swelling in legs in feet since Feb 2023. Pt states that after talking to our office in Feb her swelling did go down for 1 week. She states that Dr. Juel Burrow office changed her Lasix dosing to 60 mg two times daily. Pt denies SOB and chest pain at this time. Pt states that she only weighs herself every other week has gained 10 pounds in the last week. Please advise.

## 2020-12-29 ENCOUNTER — Other Ambulatory Visit (HOSPITAL_COMMUNITY): Payer: Self-pay | Admitting: Family Medicine

## 2020-12-29 DIAGNOSIS — I509 Heart failure, unspecified: Secondary | ICD-10-CM

## 2020-12-29 NOTE — Telephone Encounter (Signed)
Called to notify pt. No answer. Left msg to call back.  

## 2020-12-29 NOTE — Telephone Encounter (Signed)
Pt notified of plan of care and agrees to call and update [place] on Monday.

## 2021-01-09 DIAGNOSIS — G43701 Chronic migraine without aura, not intractable, with status migrainosus: Secondary | ICD-10-CM | POA: Diagnosis not present

## 2021-01-11 ENCOUNTER — Ambulatory Visit (HOSPITAL_COMMUNITY)
Admission: RE | Admit: 2021-01-11 | Discharge: 2021-01-11 | Disposition: A | Discharge status: Home / Self Care | Payer: Medicare Other | Source: Ambulatory Visit | Attending: Family Medicine | Admitting: Family Medicine

## 2021-01-11 ENCOUNTER — Other Ambulatory Visit: Payer: Self-pay

## 2021-01-11 ENCOUNTER — Other Ambulatory Visit (HOSPITAL_COMMUNITY): Payer: Self-pay | Admitting: Family Medicine

## 2021-01-11 ENCOUNTER — Ambulatory Visit (HOSPITAL_BASED_OUTPATIENT_CLINIC_OR_DEPARTMENT_OTHER)
Admission: RE | Admit: 2021-01-11 | Discharge: 2021-01-11 | Disposition: A | Discharge status: Home / Self Care | Payer: Medicare Other | Source: Ambulatory Visit | Attending: Family Medicine | Admitting: Family Medicine

## 2021-01-11 DIAGNOSIS — I351 Nonrheumatic aortic (valve) insufficiency: Secondary | ICD-10-CM | POA: Diagnosis not present

## 2021-01-11 DIAGNOSIS — R251 Tremor, unspecified: Secondary | ICD-10-CM | POA: Diagnosis not present

## 2021-01-11 DIAGNOSIS — G252 Other specified forms of tremor: Secondary | ICD-10-CM

## 2021-01-11 DIAGNOSIS — E785 Hyperlipidemia, unspecified: Secondary | ICD-10-CM | POA: Insufficient documentation

## 2021-01-11 DIAGNOSIS — K219 Gastro-esophageal reflux disease without esophagitis: Secondary | ICD-10-CM | POA: Diagnosis not present

## 2021-01-11 DIAGNOSIS — I509 Heart failure, unspecified: Secondary | ICD-10-CM | POA: Insufficient documentation

## 2021-01-11 DIAGNOSIS — I110 Hypertensive heart disease with heart failure: Secondary | ICD-10-CM | POA: Diagnosis not present

## 2021-01-11 DIAGNOSIS — G4730 Sleep apnea, unspecified: Secondary | ICD-10-CM | POA: Diagnosis not present

## 2021-01-11 LAB — ECHOCARDIOGRAM COMPLETE
Area-P 1/2: 3.53 cm2
S' Lateral: 2.7 cm

## 2021-01-11 NOTE — Progress Notes (Signed)
*  PRELIMINARY RESULTS* Echocardiogram 2D Echocardiogram has been performed.  [name] 01/11/2021, 3:36 PM

## 2021-01-17 ENCOUNTER — Encounter: Payer: Self-pay | Admitting: Orthopedic Surgery

## 2021-01-17 ENCOUNTER — Other Ambulatory Visit: Payer: Self-pay

## 2021-01-17 ENCOUNTER — Ambulatory Visit (INDEPENDENT_AMBULATORY_CARE_PROVIDER_SITE_OTHER): Payer: Medicare Other | Admitting: Orthopedic Surgery

## 2021-01-17 VITALS — BP 144/78 | HR 64 | Ht 62.0 in | Wt 185.0 lb

## 2021-01-17 DIAGNOSIS — M1712 Unilateral primary osteoarthritis, left knee: Secondary | ICD-10-CM

## 2021-01-17 DIAGNOSIS — M7052 Other bursitis of knee, left knee: Secondary | ICD-10-CM | POA: Diagnosis not present

## 2021-01-17 NOTE — Progress Notes (Signed)
FOLLOW UP   Encounter Diagnoses  Name Primary?   Pes anserinus bursitis of left knee Yes   Primary osteoarthritis of left knee      Chief Complaint  Patient presents with   Knee Pain    Left/ wants injection      [name] has chronic pain in the left knee and chronic pes bursitis comes in for injections in both areas  Consent was given to inject the left knee joint as well as the Pez anserine bursa and this was confirmed with timeout  Medication injected Celestone 6 mg and 2 cc 1% lidocaine for the pes injection and then 6 mg Celestone and 3 to 4 cc lidocaine 1% for the knee joint injection  We gave the medial intra-articular injection using ethyl chloride and alcohol to prep the skin and then followed with a pes bursa injection using the same technique  Patient advised ice the knee and she requested a 27-monthfollow-up

## 2021-01-19 ENCOUNTER — Ambulatory Visit: Payer: Medicare Other | Admitting: Cardiology

## 2021-01-20 DIAGNOSIS — R531 Weakness: Secondary | ICD-10-CM | POA: Diagnosis not present

## 2021-01-20 DIAGNOSIS — R5381 Other malaise: Secondary | ICD-10-CM | POA: Diagnosis not present

## 2021-01-20 DIAGNOSIS — W19XXXA Unspecified fall, initial encounter: Secondary | ICD-10-CM | POA: Diagnosis not present

## 2021-01-25 ENCOUNTER — Other Ambulatory Visit: Payer: Self-pay | Admitting: Student

## 2021-02-04 ENCOUNTER — Other Ambulatory Visit: Payer: Self-pay | Admitting: Gastroenterology

## 2021-02-04 DIAGNOSIS — K219 Gastro-esophageal reflux disease without esophagitis: Secondary | ICD-10-CM

## 2021-02-04 DIAGNOSIS — R1310 Dysphagia, unspecified: Secondary | ICD-10-CM

## 2021-02-05 NOTE — Progress Notes (Signed)
Referring Provider: Celene Squibb, MD Primary Care Physician:  Celene Squibb, MD Primary GI Physician: Dr. Gala Romney  Chief Complaint  Patient presents with   Constipation    BM once every 2 weeks   Gastroesophageal Reflux    Ran out of medication 1 week ago.     HPI:   [name] is a 75 y.o. female with a history of  GERD, esophageal granular cell tumor s/p EMR in 2011 by Dr. Newman Pies with [facility]. Has also seen Dr. Newman Pies for uncontrolled GERD in 2014 s/p EGDs, normal GES, and a variety of PPIs. Manometry had been attempted but she was unable to tolerate the probe.  EGD in April 2021 with Dr. Gala Romney due to uncontrolled GERD/odynophagia revealing nonobstructing Schatzki's ring, small hiatal hernia, abnormal gastric mucosa (chronic inactive gastritis, no H. pylori on biopsy), normal examined duodenum.  Suspected daily Goody powders likely explain refractory upper GI symptoms.  Also with chronic constipation likely opioid induced, and adenomatous colon polyps with last colonoscopy in January 2020, no recommendations to repeat due to age. She is presenting today for follow-up of GERD and constipation.  Last seen in our office 06/21/2020.  GERD fairly well controlled on Aciphex twice daily.  Breakthrough once every 2 to 3 weeks.  Occasional nausea without vomiting.  Denies abdominal pain.  Continued with 1 Goody powder daily.  Felt foods were going down the wrong way causing her to become choked.  Denied esophageal dysphagia.  Taking Trulance 2 to 3 days a week for constipation with bowel movements every 3-4 days with occasional straining.  If taking Trulance daily, she had significant urgency with inability to get to the bathroom in time.  No BRBPR or melena.  Recommended starting MiraLAX 17 g daily, use Trulance 3 mg daily as needed, continue Aciphex 20 mg twice daily, use Pepcid 20 mg up to twice daily as needed for breakthrough symptoms, discontinue Goody powders, refer for SLP  evaluation.  Modified barium swallow January 2022 revealing normal to mild oral phase dysphagia characterized by prolonged oral transit with solids with piecemeal deglutition, mildly reduced tongue base retraction with minimal vallecular and lateral channel residue with straw sips of thin liquids which clear with dry swallow.  Patient unable to propel barium pill posteriorly in oral cavity on first of the liquid, but able to on second sip.  Pill passed into the stomach.  Evidence of diffuse esophageal dysmotility.  No penetration, aspiration, or significant oral or pharyngeal residuals with consistencies and textures presented.  Patient symptoms of "choking" not replicated during study.  Recommended regular textures and thin liquids with standard aspiration and reflux precautions.  Today: Feels dysphagia is worsening. Can't get the foods to the back of her throat well to swallow.  Occurs with everything. Once she swallows, items go down her esophagus fine. No weight loss.  GERD: Well controlled.   Constipation: No brbpr or melena. Bms every couple of weeks. Trulance daily is too strong.  Using as needed.  Didn't try MiraLAX.   Past Medical History:  Diagnosis Date   Adenomatous colon polyp 2006   excised in 2006 & 2010Due surveillance 06/2013   Anxiety    Anxiety and depression    Asthma    Breast mass    right nipple bengn mass per patient   Breast tumor    Chest discomfort    Chronic back pain    Chronic constipation    Complication of anesthesia    Depression  Diverticulosis    Fibromyalgia    Gastroesophageal reflux disease    Hiatal hernia    History of benign esophageal tumor 2010   granular cell esophageal tumor (Dx 06/2008), resected via EMR 2011, due repeat EGD 02/2012   Hyperlipidemia    Hypertension    Insomnia    Migraines    PONV (postoperative nausea and vomiting)    Psychosis ([place])    Sleep apnea    Stop Bang score of 5. Pt said she was told by Dr. Merlene Laughter that  she had "a little bit" of sleep apena, but not bad enough to treat.   Thyroid nodule    Tumor of esophagus     Past Surgical History:  Procedure Laterality Date   ABDOMINAL HYSTERECTOMY     BIOPSY  10/14/2019   Procedure: BIOPSY;  Surgeon: Daneil Dolin, MD;  [facility];  Service: Endoscopy;;   BRAVO [place] STUDY  12/11/2011   Procedure: BRAVO [place];  Surgeon: Daneil Dolin, MD;  Location: [facility];  Service: Endoscopy;  Laterality: N/A;   BREAST EXCISIONAL BIOPSY  1990s, 2012   Left x2-sclerosing ductal papilloma-2012   CHOLECYSTECTOMY N/A 04/09/2013   Procedure: LAPAROSCOPIC CHOLECYSTECTOMY;  Surgeon: Jamesetta So, MD;  Location: [facility];  Service: General;  Laterality: N/A;   COLONOSCOPY  06/2008, 06/2011   sigmoid tics, tubular adenoma; 2013: anal canal hemorrhoids   COLONOSCOPY  07/10/2011   Anal canal hemorrhoids likely the cause of hematochezia in the setting of constipation; otherwise normal rectum ;submucosal  petechiae in left colon of doubtful clinical significance; otherwise, normal colon   COLONOSCOPY WITH PROPOFOL N/A 11/10/2017   cancelled in pre-op   COLONOSCOPY WITH PROPOFOL N/A 07/23/2018   Procedure: COLONOSCOPY WITH PROPOFOL;  Surgeon: Daneil Dolin, MD; 1 tubular adenoma, diverticulosis in the sigmoid and descending colon, nonbleeding internal hemorrhoids.  No recommendations to repeat due to age.   ESOPHAGEAL DILATION N/A 05/08/2015   Procedure: ESOPHAGEAL DILATION;  Surgeon: Daneil Dolin, MD;  Location: [facility];  Service: Endoscopy;  Laterality: N/AVenia Minks 54/56   ESOPHAGOGASTRODUODENOSCOPY  12/11/2011   [id number] Schatzki's ring; otherwise normal/Small hiatal hernia. Antral and bulbar erosions   ESOPHAGOGASTRODUODENOSCOPY (EGD) WITH PROPOFOL N/A 05/08/2015   Dr. Gala Romney: mild erosive reflux esophagitis, non-critical Schatzki's ring s/p dilation. Hiatal hernia.    ESOPHAGOGASTRODUODENOSCOPY (EGD) WITH PROPOFOL N/A 10/14/2019   Procedure:  ESOPHAGOGASTRODUODENOSCOPY (EGD) WITH PROPOFOL;  Surgeon: Daneil Dolin, MD;  Nonobstructing Schatzki ring, small hiatal hernia, abnormal gastric mucosa s/p biopsied, normal first and second portion of the duodenum.  Pathology with chronic inactive gastritis, no H. pylori.   EUS  08/2010   Dr [facility] with EGD. Retained food. No recurrent esophageal lesion, bx negative.   IR KYPHO THORACIC WITH BONE BIOPSY  10/17/2020   IR RADIOLOGIST EVAL & MGMT  09/12/2020   LUNG BIOPSY     negative   PARATHYROIDECTOMY  April 2017   [facility].    PARATHYROIDECTOMY     POLYPECTOMY  07/23/2018   Procedure: POLYPECTOMY;  Surgeon: Daneil Dolin, MD;  [facility];  Service: Endoscopy;;  colon   RIGHT OOPHORECTOMY     benign disease   SHOULDER ARTHROSCOPY     Right; bone spurs removed   TONSILLECTOMY     TOTAL KNEE ARTHROPLASTY  2002   Right; previous arthroscopic surgery    Current Outpatient Medications  Medication Sig Dispense Refill   AIMOVIG 70 MG/ML SOAJ Inject 70 mg  into the skin every 30 (thirty) days.     albuterol (PROVENTIL HFA;VENTOLIN HFA) 108 (90 BASE) MCG/ACT inhaler Inhale 2 puffs into the lungs every 6 (six) hours as needed for wheezing or shortness of breath.      amLODipine (NORVASC) 10 MG tablet Take 10 mg by mouth daily.     Aspirin-Acetaminophen-Caffeine (GOODY HEADACHE PO) Take 1 packet by mouth daily as needed (headaches/pain).     atenolol (TENORMIN) 100 MG tablet TAKE 1 TABLET BY MOUTH  DAILY 90 tablet 3   atenolol (TENORMIN) 25 MG tablet TAKE 1 TABLET BY MOUTH  DAILY IN ADDITION TO '100MG'$   TABLET DAILY FOR A TOTAL OF '125MG'$  DAILY 90 tablet 3   calcium carbonate (TUMS - DOSED IN MG ELEMENTAL CALCIUM) 500 MG chewable tablet Chew 1,500 mg by mouth daily as needed for indigestion or heartburn.     CALCIUM-VITAMIN D PO Take 1 tablet by mouth daily.     cetirizine (ZYRTEC) 10 MG tablet Take 10 mg by mouth daily as needed for allergies.     chlorthalidone (HYGROTON) 25 MG  tablet Take 1/2 tablet by mouth daily. (Patient taking differently: Take 12.5 mg by mouth daily.) 45 tablet 2   cycloSPORINE (RESTASIS) 0.05 % ophthalmic emulsion Place 1 drop into both eyes 2 (two) times daily as needed (dry eyes).     famotidine (PEPCID) 20 MG tablet Take 1 tablet (20 mg total) by mouth as needed for heartburn or indigestion. 60 tablet 3   ferrous sulfate 325 (65 FE) MG tablet Take 325 mg by mouth daily with breakfast.     fluticasone (FLONASE) 50 MCG/ACT nasal spray Place 1 spray into both nostrils daily as needed for allergies.      furosemide (LASIX) 40 MG tablet Take 40 mg daily as needed for swelling. (Patient taking differently: Take 40 mg by mouth daily as needed for edema.) 90 tablet 3   gabapentin (NEURONTIN) 100 MG capsule Take 100 mg by mouth 3 (three) times daily.     HYDROcodone-acetaminophen (NORCO/VICODIN) 5-325 MG tablet Take 1 tablet by mouth 2 (two) times daily as needed for moderate pain.     hydrOXYzine (ATARAX/VISTARIL) 25 MG tablet Take 25 mg by mouth daily.     INGREZZA 80 MG CAPS Take 80 mg by mouth daily.     lovastatin (MEVACOR) 40 MG tablet Take 40 mg by mouth at bedtime.     Melatonin 3 MG TABS Take 9 mg by mouth at bedtime.      memantine (NAMENDA) 10 MG tablet Take 10 mg by mouth 2 (two) times daily.      metoprolol tartrate (LOPRESSOR) 25 MG tablet Take 12.5 mg by mouth 2 (two) times daily.     neomycin-bacitracin-polymyxin (NEOSPORIN) [phone] ointment Apply 1 application topically as needed (wound care).     OLANZapine (ZYPREXA) 7.5 MG tablet Take 7.5 mg by mouth at bedtime.     polyethylene glycol (MIRALAX / GLYCOLAX) 17 g packet Take 17 g by mouth daily as needed.     potassium chloride SA (KLOR-CON) 20 MEQ tablet TAKE TWO TABLETS (40MEQ TOTAL) BY MOUTH IN THE MORNING AND ONE TABLET (20 MEQ INTHE AFTER NOON 270 tablet 3   Semaglutide (RYBELSUS) 7 MG TABS Take 7 mg by mouth daily.     traZODone (DESYREL) 100 MG tablet Take 300 mg by mouth at  bedtime.   3   TRULANCE 3 MG TABS TAKE 1 TABLET BY MOUTH  DAILY (Patient taking differently: Take 3  mg by mouth daily. As needed) 90 tablet 3   venlafaxine XR (EFFEXOR-XR) 150 MG 24 hr capsule Take 150 mg by mouth at bedtime. Take with 75 mg to equal 225 mg at bedtime     venlafaxine XR (EFFEXOR-XR) 75 MG 24 hr capsule Take 75 mg by mouth at bedtime. Take with 150 mg to equal 225 mg at bedtime     zolpidem (AMBIEN) 10 MG tablet Take 10 mg by mouth at bedtime.     RABEprazole (ACIPHEX) 20 MG tablet Take 1 tablet (20 mg total) by mouth 2 (two) times daily. 180 tablet 3   No current facility-administered medications for this visit.    Allergies as of 02/07/2021 - Review Complete 02/07/2021  Allergen Reaction Noted   Elavil [amitriptyline] Other (See Comments) 06/03/2012   Abilify [aripiprazole] Other (See Comments) 11/25/2012   Codeine Hives, Nausea Only, and Other (See Comments)    Latex Hives 11/21/2011   Penicillins Hives, Itching, and Other (See Comments)    Polyethylene glycol Other (See Comments) 12/26/2012   Sulfonamide derivatives Nausea And Vomiting    Cymbalta [duloxetine hcl] Rash 03/10/2013   Remeron [mirtazapine] Other (See Comments) 05/06/2012    Family History  Problem Relation Age of Onset   Stroke Father    Alcohol abuse Father    Heart attack Mother    Depression Mother    Anxiety disorder Mother    Colon cancer Paternal Aunt    Colon cancer Paternal Uncle    Dementia Maternal Uncle    ADD / ADHD Neg Hx    Bipolar disorder Neg Hx    Drug abuse Neg Hx    OCD Neg Hx    Paranoid behavior Neg Hx    Schizophrenia Neg Hx    Seizures Neg Hx    Sexual abuse Neg Hx    Physical abuse Neg Hx     Social History   Socioeconomic History   Marital status: Single    Spouse name: Not on file   Number of children: 1   Years of education: Not on file   Highest education level: Not on file  Occupational History   Occupation: disabled    Employer: RETIRED  Tobacco Use    Smoking status: Never   Smokeless tobacco: Never  Vaping Use   Vaping Use: Never used  Substance and Sexual Activity   Alcohol use: No    Alcohol/week: 0.0 standard drinks   Drug use: No   Sexual activity: Never  Other Topics Concern   Not on file  Social History Narrative   Not on file   Social Determinants of Health   Financial Resource Strain: Not on file  Food Insecurity: Not on file  Transportation Needs: Not on file  Physical Activity: Not on file  Stress: Not on file  Social Connections: Not on file    Review of Systems: Gen: Denies fever, cold or flulike symptoms, presyncope, syncope.  Admits to intermittent dizziness with sensation of room spinning. CV: Denies chest pain, palpitations Resp: Admits to chronic shortness of breath with exertion.  No shortness of breath at rest.  No cough. GI: See HPI Heme: See HPI  Physical Exam: BP 140/77   Pulse 65   Temp 97.7 F (36.5 C)   Ht '5\' 2"'$  (1.575 m)   Wt 201 lb (91.2 kg)   BMI 36.76 kg/m  General:   Alert and oriented. No distress noted. Pleasant and cooperative.  Walking with a walker. Head:  Normocephalic  and atraumatic. Eyes:  Conjuctiva clear without scleral icterus. Heart:  S1, S2 present without murmurs appreciated. Lungs:  Clear to auscultation bilaterally. No wheezes, rales, or rhonchi. No distress.  Abdomen:  +BS, soft, non-tender and non-distended. No rebound or guarding. No HSM or masses noted. Msk:  Symmetrical without gross deformities. Normal posture. Extremities:  Without edema. Neurologic:  Alert and  oriented x4 Psych:  Normal mood and affect.    Assessment: 75 year old female with history of GERD, esophageal granular cell tumor s/p EMR in 2011 by Dr. Newman Pies with [facility], history of Schatzki's ring s/p dilation April 2021, chronic constipation likely opioid induced, adenomatous colon polyps with colonoscopy up-to-date in 2020 with no recommendations to repeat due to age who  is presenting today for follow-up of GERD and constipation.  Also reports some worsening of her dysphagia.  GERD: Has been well controlled on Aciphex 20 mg twice daily.  Ran out about 1 week ago and is currently having symptoms.  We will resume Aciphex twice daily.  Dysphagia: Patient reports worsening of oropharyngeal dysphagia.  She describes difficulty of getting the food to the back of her throat and trouble with the action of swallowing.  Denies any esophageal dysphagia.  States when she swallows, foods, liquids, and pills go down fine.  Modified barium swallow on file from January 2022 with mild oropharyngeal dysphagia and esophageal dysmotility. See HPI for details.  As symptoms seem primarily oropharyngeal, we will refer her back to SLP for further evaluation.   Constipation: Chronic.  Likely influenced by chronic pain medications.  Not adequately managed.  Currently with BMs every couple of weeks using Trulance as needed.  Trulance daily is too strong.  No alarm symptoms.  Previously failed Linzess, Amitiza, and Movantik.  Colonoscopy up-to-date in 2020.  I recommended Benefiber daily, MiraLAX daily, and continuing Trulance as needed.   Plan: Resume Aciphex 20 mg twice daily.  Prescription sent to pharmacy. Start MiraLAX 1 capful (17 g) daily in 8 ounces of water/juice/Gatorade. Start Benefiber daily. Continue Trulance as needed. Requested progress report in 4 weeks on constipation. Refer to SLP for further evaluation of oropharyngeal dysphagia. Chopped meats finely.  Take small bites and swallow twice with each bite. Follow-up in 6 months or sooner if needed.    Aliene Altes, PA-C [facility] 02/08/2021

## 2021-02-06 DIAGNOSIS — G43701 Chronic migraine without aura, not intractable, with status migrainosus: Secondary | ICD-10-CM | POA: Diagnosis not present

## 2021-02-07 ENCOUNTER — Ambulatory Visit: Payer: Medicare Other | Admitting: Gastroenterology

## 2021-02-07 ENCOUNTER — Other Ambulatory Visit: Payer: Self-pay

## 2021-02-07 ENCOUNTER — Encounter: Payer: Self-pay | Admitting: Gastroenterology

## 2021-02-07 VITALS — BP 140/77 | HR 65 | Temp 97.7°F | Ht 62.0 in | Wt 201.0 lb

## 2021-02-07 DIAGNOSIS — R1312 Dysphagia, oropharyngeal phase: Secondary | ICD-10-CM

## 2021-02-07 DIAGNOSIS — K5909 Other constipation: Secondary | ICD-10-CM

## 2021-02-07 DIAGNOSIS — K219 Gastro-esophageal reflux disease without esophagitis: Secondary | ICD-10-CM

## 2021-02-07 MED ORDER — RABEPRAZOLE SODIUM 20 MG PO TBEC: 20.0000 mg | DELAYED_RELEASE_TABLET | Freq: Two times a day (BID) | ORAL | 3 refills | Status: DC

## 2021-02-07 MED ORDER — RABEPRAZOLE SODIUM 20 MG PO TBEC: 20.0000 mg | DELAYED_RELEASE_TABLET | Freq: Two times a day (BID) | ORAL | 0 refills | Status: DC

## 2021-02-07 NOTE — Patient Instructions (Signed)
For acid reflux: Resume Aciphex 20 mg twice daily 30 minutes before breakfast and dinner.  I have sent in a 1 month supply to [place] as he can pick this up today. I have also sent a 1 year supply to [employer] [facility] pill pack.  For constipation: Start MiraLAX 1 capful (17 g) daily in 8 ounces of water/Gatorade/juice. Start Benefiber daily.  You may follow the instructions on the package. Continue to use Trulance as needed. Call with a progress report in about 4 weeks.  For your swallowing trouble: We will refer you back to the speech-language pathologist for further evaluation. Recommend your meats be chopped finely. Take small bites, and swallow twice with each bite.  We will follow-up with you in 6 months.  Do not hesitate to call if you have questions or concerns prior to your next visit.   It was great to see you again today!  I hope you have a great rest of your summer!  Aliene Altes, PA-C [facility]

## 2021-02-08 ENCOUNTER — Ambulatory Visit (HOSPITAL_COMMUNITY)
Admission: RE | Admit: 2021-02-08 | Discharge: 2021-02-08 | Disposition: A | Discharge status: Home / Self Care | Payer: Medicare Other | Source: Ambulatory Visit | Attending: Internal Medicine | Admitting: Internal Medicine

## 2021-02-08 DIAGNOSIS — Z1231 Encounter for screening mammogram for malignant neoplasm of breast: Secondary | ICD-10-CM | POA: Insufficient documentation

## 2021-02-09 ENCOUNTER — Other Ambulatory Visit (HOSPITAL_COMMUNITY): Payer: Self-pay | Admitting: Specialist

## 2021-02-09 ENCOUNTER — Encounter: Payer: Self-pay | Admitting: Student

## 2021-02-09 ENCOUNTER — Ambulatory Visit (INDEPENDENT_AMBULATORY_CARE_PROVIDER_SITE_OTHER): Payer: Medicare Other | Admitting: Student

## 2021-02-09 ENCOUNTER — Other Ambulatory Visit (HOSPITAL_COMMUNITY)
Admission: RE | Admit: 2021-02-09 | Discharge: 2021-02-09 | Disposition: A | Discharge status: Home / Self Care | Payer: Medicare Other | Source: Ambulatory Visit | Attending: Student | Admitting: Student

## 2021-02-09 ENCOUNTER — Other Ambulatory Visit: Payer: Self-pay

## 2021-02-09 VITALS — BP 142/80 | HR 64 | Ht 62.0 in | Wt 195.0 lb

## 2021-02-09 DIAGNOSIS — R002 Palpitations: Secondary | ICD-10-CM | POA: Diagnosis not present

## 2021-02-09 DIAGNOSIS — I5032 Chronic diastolic (congestive) heart failure: Secondary | ICD-10-CM | POA: Diagnosis not present

## 2021-02-09 DIAGNOSIS — E782 Mixed hyperlipidemia: Secondary | ICD-10-CM | POA: Diagnosis not present

## 2021-02-09 DIAGNOSIS — I10 Essential (primary) hypertension: Secondary | ICD-10-CM | POA: Diagnosis not present

## 2021-02-09 DIAGNOSIS — R1312 Dysphagia, oropharyngeal phase: Secondary | ICD-10-CM

## 2021-02-09 DIAGNOSIS — Z79899 Other long term (current) drug therapy: Secondary | ICD-10-CM | POA: Insufficient documentation

## 2021-02-09 LAB — BASIC METABOLIC PANEL
Anion gap: 6 (ref 5–15)
BUN: 12 mg/dL (ref 8–23)
CO2: 25 mmol/L (ref 22–32)
Calcium: 9 mg/dL (ref 8.9–10.3)
Chloride: 105 mmol/L (ref 98–111)
Creatinine, Ser: 0.99 mg/dL (ref 0.44–1.00)
GFR, Estimated: 59 mL/min — ABNORMAL LOW (ref >60)
Glucose, Bld: 99 mg/dL (ref 70–99)
Potassium: 3.5 mmol/L (ref 3.5–5.1)
Sodium: 136 mmol/L (ref 135–145)

## 2021-02-09 NOTE — Patient Instructions (Signed)
Medication Instructions:  Your physician has recommended you make the following change in your medication:  STOP Metoprolol Tartrate    *If you need a refill on your cardiac medications before your next appointment, please call your pharmacy*   Lab Work: BMET- TODAY If you have labs (blood work) drawn today and your tests are completely normal, you will receive your results only by: [place] (if you have MyChart) OR A paper copy in the mail If you have any lab test that is abnormal or we need to change your treatment, we will call you to review the results.   Testing/Procedures: None   Follow-Up: At [facility], you and your health needs are our priority.  As part of our continuing mission to provide you with exceptional heart care, we have created designated Provider Care Teams.  These Care Teams include your primary Cardiologist (physician) and Advanced Practice Providers (APPs -  Physician Assistants and Nurse Practitioners) who all work together to provide you with the care you need, when you need it.  We recommend signing up for the patient portal called "MyChart".  Sign up information is provided on this After Visit Summary.  MyChart is used to connect with patients for Virtual Visits (Telemedicine).  Patients are able to view lab/test results, encounter notes, upcoming appointments, etc.  Non-urgent messages can be sent to your provider as well.   To learn more about what you can do with MyChart, go to [email or web address].    Your next appointment:   2 month(s)  The format for your next appointment:   In Person  Provider:   Carlyle Dolly, MD or Bernerd Pho, PA-C   Other Instructions    Two Gram Sodium Diet 2000 mg  What is Sodium? Sodium is a mineral found naturally in many foods. The most significant source of sodium in the diet is table salt, which is about 40% sodium.  Processed, convenience, and preserved foods also contain a large amount of  sodium.  The body needs only 500 mg of sodium daily to function,  A normal diet provides more than enough sodium even if you do not use salt.  Why Limit Sodium? A build up of sodium in the body can cause thirst, increased blood pressure, shortness of breath, and water retention.  Decreasing sodium in the diet can reduce edema and risk of heart attack or stroke associated with high blood pressure.  Keep in mind that there are many other factors involved in these health problems.  Heredity, obesity, lack of exercise, cigarette smoking, stress and what you eat all play a role.  General Guidelines: Do not add salt at the table or in cooking.  One teaspoon of salt contains over 2 grams of sodium. Read food labels Avoid processed and convenience foods Ask your dietitian before eating any foods not dicussed in the menu planning guidelines Consult your physician if you wish to use a salt substitute or a sodium containing medication such as antacids.  Limit milk and milk products to 16 oz (2 cups) per day.  Shopping Hints: READ LABELS!! "Dietetic" does not necessarily mean low sodium. Salt and other sodium ingredients are often added to foods during processing.    Menu Planning Guidelines Food Group Choose More Often Avoid  Beverages (see also the milk group All fruit juices, low-sodium, salt-free vegetables juices, low-sodium carbonated beverages Regular vegetable or tomato juices, commercially softened water used for drinking or cooking  Breads and Cereals Enriched white, wheat, rye and pumpernickel  bread, hard rolls and dinner rolls; muffins, cornbread and waffles; most dry cereals, cooked cereal without added salt; unsalted crackers and breadsticks; low sodium or homemade bread crumbs Bread, rolls and crackers with salted tops; quick breads; instant hot cereals; pancakes; commercial bread stuffing; self-rising flower and biscuit mixes; regular bread crumbs or cracker crumbs  Desserts and Sweets  Desserts and sweets mad with mild should be within allowance Instant pudding mixes and cake mixes  Fats Butter or margarine; vegetable oils; unsalted salad dressings, regular salad dressings limited to 1 Tbs; light, sour and heavy cream Regular salad dressings containing bacon fat, bacon bits, and salt pork; snack dips made with instant soup mixes or processed cheese; salted nuts  Fruits Most fresh, frozen and canned fruits Fruits processed with salt or sodium-containing ingredient (some dried fruits are processed with sodium sulfites        Vegetables Fresh, frozen vegetables and low- sodium canned vegetables Regular canned vegetables, sauerkraut, pickled vegetables, and others prepared in brine; frozen vegetables in sauces; vegetables seasoned with ham, bacon or salt pork  Condiments, Sauces, Miscellaneous  Salt substitute with physician's approval; pepper, herbs, spices; vinegar, lemon or lime juice; hot pepper sauce; garlic powder, onion powder, low sodium soy sauce (1 Tbs.); low sodium condiments (ketchup, chili sauce, mustard) in limited amounts (1 tsp.) fresh ground horseradish; unsalted tortilla chips, pretzels, potato chips, popcorn, salsa (1/4 cup) Any seasoning made with salt including garlic salt, celery salt, onion salt, and seasoned salt; sea salt, rock salt, kosher salt; meat tenderizers; monosodium glutamate; mustard, regular soy sauce, barbecue, sauce, chili sauce, teriyaki sauce, steak sauce, Worcestershire sauce, and most flavored vinegars; canned gravy and mixes; regular condiments; salted snack foods, olives, picles, relish, horseradish sauce, catsup   Food preparation: Try these seasonings Meats:    Pork Sage, onion Serve with applesauce  Chicken Poultry seasoning, thyme, parsley Serve with cranberry sauce  Lamb Curry powder, rosemary, garlic, thyme Serve with mint sauce or jelly  Veal Marjoram, basil Serve with current jelly, cranberry sauce  Beef Pepper, bay leaf Serve with  dry mustard, unsalted chive butter  [place] leaf, dill Serve with unsalted lemon butter, unsalted parsley butter  Vegetables:    Asparagus Lemon juice   Broccoli Lemon juice   Carrots Mustard dressing parsley, mint, nutmeg, glazed with unsalted butter and sugar   Green beans Marjoram, lemon juice, nutmeg,dill seed   Tomatoes Basil, marjoram, onion   Spice /blend for [employer]" 4 tsp ground thyme 1 tsp ground sage 3 tsp ground rosemary 4 tsp ground marjoram   Test your knowledge A product that says "Salt Free" may still contain sodium. True or False Garlic Powder and Hot Pepper Sauce an be used as alternative seasonings.True or False Processed foods have more sodium than fresh foods.  True or False Canned Vegetables have less sodium than froze True or False   WAYS TO DECREASE YOUR SODIUM INTAKE Avoid the use of added salt in cooking and at the table.  Table salt (and other prepared seasonings which contain salt) is probably one of the greatest sources of sodium in the diet.  Unsalted foods can gain flavor from the sweet, sour, and butter taste sensations of herbs and spices.  Instead of using salt for seasoning, try the following seasonings with the foods listed.  Remember: how you use them to enhance natural food flavors is limited only by your creativity... Allspice-Meat, fish, eggs, fruit, peas, red and yellow vegetables Almond Extract-Fruit baked goods Anise Seed-Sweet breads,  fruit, carrots, beets, cottage cheese, cookies (tastes like licorice) Basil-Meat, fish, eggs, vegetables, rice, vegetables salads, soups, sauces Bay Leaf-Meat, fish, stews, poultry Burnet-Salad, vegetables (cucumber-like flavor) Caraway Seed-Bread, cookies, cottage cheese, meat, vegetables, cheese, rice Cardamon-Baked goods, fruit, soups Celery Powder or seed-Salads, salad dressings, sauces, meatloaf, soup, bread.Do not use  celery salt Chervil-Meats, salads, fish, eggs, vegetables, cottage cheese  (parsley-like flavor) Chili Power-Meatloaf, chicken cheese, corn, eggplant, egg dishes Chives-Salads cottage cheese, egg dishes, soups, vegetables, sauces Cilantro-Salsa, casseroles Cinnamon-Baked goods, fruit, pork, lamb, chicken, carrots Cloves-Fruit, baked goods, fish, pot roast, green beans, beets, carrots Coriander-Pastry, cookies, meat, salads, cheese (lemon-orange flavor) Cumin-Meatloaf, fish,cheese, eggs, cabbage,fruit pie (caraway flavor) [employer], fruit, eggs, fish, poultry, cottage cheese, vegetables Dill Seed-Meat, cottage cheese, poultry, vegetables, fish, salads, bread Fennel Seed-Bread, cookies, apples, pork, eggs, fish, beets, cabbage, cheese, Licorice-like flavor Garlic-(buds or powder) Salads, meat, poultry, fish, bread, butter, vegetables, potatoes.Do not  use garlic salt Ginger-Fruit, vegetables, baked goods, meat, fish, poultry Horseradish Root-Meet, vegetables, butter Lemon Juice or Extract-Vegetables, fruit, tea, baked goods, fish salads Mace-Baked goods fruit, vegetables, fish, poultry (taste like nutmeg) Maple Extract-Syrups Marjoram-Meat, chicken, fish, vegetables, breads, green salads (taste like Sage) Mint-Tea, lamb, sherbet, vegetables, desserts, carrots, cabbage Mustard, Dry or Seed-Cheese, eggs, meats, vegetables, poultry Nutmeg-Baked goods, fruit, chicken, eggs, vegetables, desserts Onion Powder-Meat, fish, poultry, vegetables, cheese, eggs, bread, rice salads (Do not use   Onion salt) Orange Extract-Desserts, baked goods Oregano-Pasta, eggs, cheese, onions, pork, lamb, fish, chicken, vegetables, green salads Paprika-Meat, fish, poultry, eggs, cheese, vegetables Parsley Flakes-Butter, vegetables, meat fish, poultry, eggs, bread, salads (certain forms may   Contain sodium Pepper-Meat fish, poultry, vegetables, eggs Peppermint Extract-Desserts, baked goods Poppy Seed-Eggs, bread, cheese, fruit dressings, baked goods, noodles, vegetables, cottage   [employer], poultry, meat, fish, cauliflower, turnips,eggs bread Saffron-Rice, bread, veal, chicken, fish, eggs Sage-Meat, fish, poultry, onions, eggplant, tomateos, pork, stews Savory-Eggs, salads, poultry, meat, rice, vegetables, soups, pork Tarragon-Meat, poultry, fish, eggs, butter, vegetables (licorice-like flavor)  Thyme-Meat, poultry, fish, eggs, vegetables, (clover-like flavor), sauces, soups Tumeric-Salads, butter, eggs, fish, rice, vegetables (saffron-like flavor) Vanilla Extract-Baked goods, candy Vinegar-Salads, vegetables, meat marinades Walnut Extract-baked goods, candy

## 2021-02-09 NOTE — Progress Notes (Addendum)
Cardiology Office Note    Date:  02/10/2021   ID:  Kennedii, Artist 27-Sep-1945, MRN [id number]  PCP:  Celene Squibb, MD  Cardiologist: Carlyle Dolly, MD    Chief Complaint  Patient presents with   Follow-up    Worsening edema    History of Present Illness:    [name] is a 75 y.o. female with past medical history of HFpEF, HTN and HLD who presents to the office today for evaluation of worsening lower extremity edema.  She was last examined by myself in 07/2020 and reported her activity had been limited since recently suffering spinal compression fractures but she was trying to gradually increase her activity. She had baseline dyspnea on exertion but denied any chest pain. Given her dyspnea on exertion and cardiac risk factors, a possible stress test was reviewed but she wished to hold off on further testing at that time.  She did call the office in 12/2020 reporting worsening lower extremity edema since 07/2020.  Her PCP had titrated Lasix to 60 mg twice daily and this was increased to 80 mg twice daily for 4 days with plans to report back on her weights and symptoms the following week. She did have an echocardiogram in 12/2020 which showed a preserved EF of 60 to 65% with no regional wall motion normalities. She did have mild LVH, normal RV function, trivial MR and mild AI.  In talking with the patient today, she reports having worsening lower extremity edema over the past few months with associated weight gain. Says her weight is typically in the 180's (was at 185 lbs per the office scales earlier this year) and had peaked at 200 lbs on her home scales. She was switched from Lasix to Bumex by her PCP and reports her weight has started to decline. She denies any associated orthopnea or PND. No reported chest pain or palpitations.  She does not add salt to her food but does consume a high sodium diet as she has sausage for breakfast several days a week and also eats out at  local restaurants 2-3 times each week including [employer], [employer] and [employer].   Past Medical History:  Diagnosis Date   Adenomatous colon polyp 2006   excised in 2006 & 2010Due surveillance 06/2013   Anxiety    Anxiety and depression    Asthma    Breast mass    right nipple bengn mass per patient   Breast tumor    Chest discomfort    Chronic back pain    Chronic constipation    Complication of anesthesia    Depression    Diverticulosis    Fibromyalgia    Gastroesophageal reflux disease    Hiatal hernia    History of benign esophageal tumor 2010   granular cell esophageal tumor (Dx 06/2008), resected via EMR 2011, due repeat EGD 02/2012   Hyperlipidemia    Hypertension    Insomnia    Migraines    PONV (postoperative nausea and vomiting)    Psychosis ([place])    Sleep apnea    Stop Bang score of 5. Pt said she was told by Dr. Merlene Laughter that she had "a little bit" of sleep apena, but not bad enough to treat.   Thyroid nodule    Tumor of esophagus     Past Surgical History:  Procedure Laterality Date   ABDOMINAL HYSTERECTOMY     BIOPSY  10/14/2019   Procedure: BIOPSY;  Surgeon: Manus Rudd  M, MD;  Location: [facility];  Service: Endoscopy;;   BRAVO [place] STUDY  12/11/2011   Procedure: BRAVO [place];  Surgeon: Daneil Dolin, MD;  Location: [facility];  Service: Endoscopy;  Laterality: N/A;   BREAST EXCISIONAL BIOPSY  1990s, 2012   Left x2-sclerosing ductal papilloma-2012   CHOLECYSTECTOMY N/A 04/09/2013   Procedure: LAPAROSCOPIC CHOLECYSTECTOMY;  Surgeon: Jamesetta So, MD;  Location: [facility];  Service: General;  Laterality: N/A;   COLONOSCOPY  06/2008, 06/2011   sigmoid tics, tubular adenoma; 2013: anal canal hemorrhoids   COLONOSCOPY  07/10/2011   Anal canal hemorrhoids likely the cause of hematochezia in the setting of constipation; otherwise normal rectum ;submucosal  petechiae in left colon of doubtful clinical significance; otherwise, normal colon    COLONOSCOPY WITH PROPOFOL N/A 11/10/2017   cancelled in pre-op   COLONOSCOPY WITH PROPOFOL N/A 07/23/2018   Procedure: COLONOSCOPY WITH PROPOFOL;  Surgeon: Daneil Dolin, MD; 1 tubular adenoma, diverticulosis in the sigmoid and descending colon, nonbleeding internal hemorrhoids.  No recommendations to repeat due to age.   ESOPHAGEAL DILATION N/A 05/08/2015   Procedure: ESOPHAGEAL DILATION;  Surgeon: Daneil Dolin, MD;  Location: [facility];  Service: Endoscopy;  Laterality: N/AVenia Minks 54/56   ESOPHAGOGASTRODUODENOSCOPY  12/11/2011   [id number] Schatzki's ring; otherwise normal/Small hiatal hernia. Antral and bulbar erosions   ESOPHAGOGASTRODUODENOSCOPY (EGD) WITH PROPOFOL N/A 05/08/2015   Dr. Gala Romney: mild erosive reflux esophagitis, non-critical Schatzki's ring s/p dilation. Hiatal hernia.    ESOPHAGOGASTRODUODENOSCOPY (EGD) WITH PROPOFOL N/A 10/14/2019   Procedure: ESOPHAGOGASTRODUODENOSCOPY (EGD) WITH PROPOFOL;  Surgeon: Daneil Dolin, MD;  Nonobstructing Schatzki ring, small hiatal hernia, abnormal gastric mucosa s/p biopsied, normal first and second portion of the duodenum.  Pathology with chronic inactive gastritis, no H. pylori.   EUS  08/2010   Dr [facility] with EGD. Retained food. No recurrent esophageal lesion, bx negative.   IR KYPHO THORACIC WITH BONE BIOPSY  10/17/2020   IR RADIOLOGIST EVAL & MGMT  09/12/2020   LUNG BIOPSY     negative   PARATHYROIDECTOMY  April 2017   [facility].    PARATHYROIDECTOMY     POLYPECTOMY  07/23/2018   Procedure: POLYPECTOMY;  Surgeon: Daneil Dolin, MD;  [facility];  Service: Endoscopy;;  colon   RIGHT OOPHORECTOMY     benign disease   SHOULDER ARTHROSCOPY     Right; bone spurs removed   TONSILLECTOMY     TOTAL KNEE ARTHROPLASTY  2002   Right; previous arthroscopic surgery    Current Medications: Outpatient Medications Prior to Visit  Medication Sig Dispense Refill   AIMOVIG 70 MG/ML SOAJ Inject 70 mg into the skin every 30  (thirty) days.     albuterol (PROVENTIL HFA;VENTOLIN HFA) 108 (90 BASE) MCG/ACT inhaler Inhale 2 puffs into the lungs every 6 (six) hours as needed for wheezing or shortness of breath.      amLODipine (NORVASC) 10 MG tablet Take 10 mg by mouth daily.     Aspirin-Acetaminophen-Caffeine (GOODY HEADACHE PO) Take 1 packet by mouth daily as needed (headaches/pain).     atenolol (TENORMIN) 100 MG tablet TAKE 1 TABLET BY MOUTH  DAILY 90 tablet 3   atenolol (TENORMIN) 25 MG tablet TAKE 1 TABLET BY MOUTH  DAILY IN ADDITION TO '100MG'$   TABLET DAILY FOR A TOTAL OF '125MG'$  DAILY 90 tablet 3   bumetanide (BUMEX) 2 MG tablet Take 2 mg by mouth daily.     calcium carbonate (TUMS -  DOSED IN MG ELEMENTAL CALCIUM) 500 MG chewable tablet Chew 1,500 mg by mouth daily as needed for indigestion or heartburn.     CALCIUM-VITAMIN D PO Take 1 tablet by mouth daily.     cetirizine (ZYRTEC) 10 MG tablet Take 10 mg by mouth daily as needed for allergies.     chlorthalidone (HYGROTON) 25 MG tablet Take 1/2 tablet by mouth daily. (Patient taking differently: Take 12.5 mg by mouth daily.) 45 tablet 2   cycloSPORINE (RESTASIS) 0.05 % ophthalmic emulsion Place 1 drop into both eyes 2 (two) times daily as needed (dry eyes).     famotidine (PEPCID) 20 MG tablet Take 1 tablet (20 mg total) by mouth as needed for heartburn or indigestion. 60 tablet 3   ferrous sulfate 325 (65 FE) MG tablet Take 325 mg by mouth daily with breakfast.     fluticasone (FLONASE) 50 MCG/ACT nasal spray Place 1 spray into both nostrils daily as needed for allergies.      gabapentin (NEURONTIN) 100 MG capsule Take 100 mg by mouth 3 (three) times daily.     HYDROcodone-acetaminophen (NORCO/VICODIN) 5-325 MG tablet Take 1 tablet by mouth 2 (two) times daily as needed for moderate pain.     hydrOXYzine (ATARAX/VISTARIL) 25 MG tablet Take 25 mg by mouth daily.     INGREZZA 80 MG CAPS Take 80 mg by mouth daily.     lovastatin (MEVACOR) 40 MG tablet Take 40 mg by  mouth at bedtime.     Melatonin 3 MG TABS Take 9 mg by mouth at bedtime.      memantine (NAMENDA) 10 MG tablet Take 10 mg by mouth 2 (two) times daily.      neomycin-bacitracin-polymyxin (NEOSPORIN) [phone] ointment Apply 1 application topically as needed (wound care).     OLANZapine (ZYPREXA) 7.5 MG tablet Take 7.5 mg by mouth at bedtime.     polyethylene glycol (MIRALAX / GLYCOLAX) 17 g packet Take 17 g by mouth daily as needed.     potassium chloride SA (KLOR-CON) 20 MEQ tablet TAKE TWO TABLETS (40MEQ TOTAL) BY MOUTH IN THE MORNING AND ONE TABLET (20 MEQ INTHE AFTER NOON 270 tablet 3   RABEprazole (ACIPHEX) 20 MG tablet Take 1 tablet (20 mg total) by mouth 2 (two) times daily. 180 tablet 3   Semaglutide (RYBELSUS) 7 MG TABS Take 7 mg by mouth daily.     traZODone (DESYREL) 100 MG tablet Take 300 mg by mouth at bedtime.   3   TRULANCE 3 MG TABS TAKE 1 TABLET BY MOUTH  DAILY (Patient taking differently: Take 3 mg by mouth daily. As needed) 90 tablet 3   venlafaxine XR (EFFEXOR-XR) 150 MG 24 hr capsule Take 150 mg by mouth at bedtime. Take with 75 mg to equal 225 mg at bedtime     venlafaxine XR (EFFEXOR-XR) 75 MG 24 hr capsule Take 75 mg by mouth at bedtime. Take with 150 mg to equal 225 mg at bedtime     zolpidem (AMBIEN) 10 MG tablet Take 10 mg by mouth at bedtime.     metoprolol tartrate (LOPRESSOR) 25 MG tablet Take 12.5 mg by mouth 2 (two) times daily.     furosemide (LASIX) 40 MG tablet Take 40 mg daily as needed for swelling. (Patient not taking: Reported on 02/09/2021) 90 tablet 3   No facility-administered medications prior to visit.     Allergies:   Elavil [amitriptyline], Abilify [aripiprazole], Codeine, Latex, Penicillins, Polyethylene glycol, Sulfonamide derivatives, Cymbalta [duloxetine hcl],  and Remeron [mirtazapine]   Social History   Socioeconomic History   Marital status: Single    Spouse name: Not on file   Number of children: 1   Years of education: Not on file    Highest education level: Not on file  Occupational History   Occupation: disabled    Employer: RETIRED  Tobacco Use   Smoking status: Never   Smokeless tobacco: Never  Vaping Use   Vaping Use: Never used  Substance and Sexual Activity   Alcohol use: No    Alcohol/week: 0.0 standard drinks   Drug use: No   Sexual activity: Never  Other Topics Concern   Not on file  Social History Narrative   Not on file   Social Determinants of Health   Financial Resource Strain: Not on file  Food Insecurity: Not on file  Transportation Needs: Not on file  Physical Activity: Not on file  Stress: Not on file  Social Connections: Not on file     Family History:  The patient's family history includes Alcohol abuse in her father; Anxiety disorder in her mother; Colon cancer in her paternal aunt and paternal uncle; Dementia in her maternal uncle; Depression in her mother; Heart attack in her mother; Stroke in her father.   Review of Systems:    Please see the history of present illness.     All other systems reviewed and are otherwise negative except as noted above.   Physical Exam:    VS:  BP (!) 142/80   Pulse 64   Ht '5\' 2"'$  (1.575 m)   Wt 195 lb (88.5 kg)   SpO2 98%   BMI 35.67 kg/m    General: Pleasant elderly female appearing in no acute distress. Head: Normocephalic, atraumatic. Neck: No carotid bruits. JVD not elevated.  Lungs: Respirations regular and unlabored, without wheezes or rales.  Heart: Regular rate and rhythm. No S3 or S4.  No murmur, no rubs, or gallops appreciated. Abdomen: Appears non-distended. No obvious abdominal masses. Msk:  Strength and tone appear normal for age. No obvious joint deformities or effusions. Extremities: No clubbing or cyanosis. 1+ pitting edema up to knees bilaterally.  Distal pedal pulses are 2+ bilaterally. Neuro: Alert and oriented X 3. Moves all extremities spontaneously. No focal deficits noted. Psych:  Responds to questions  appropriately with a normal affect. Skin: No rashes or lesions noted  Wt Readings from Last 3 Encounters:  02/09/21 195 lb (88.5 kg)  02/07/21 201 lb (91.2 kg)  01/17/21 185 lb (83.9 kg)      Studies/Labs Reviewed:   EKG:  EKG is ordered today and shows baseline artifact but appears most consistent with NSR, HR 60 with occasional PVC's. No diagnostic ST changes when compared to prior tracings.   Recent Labs: 10/17/2020: Hemoglobin 13.5; Platelets 162 02/09/2021: BUN 12; Creatinine, Ser 0.99; Potassium 3.5; Sodium 136   Lipid Panel    Component Value Date/Time   CHOL 147 06/01/2019 1045   TRIG 79 06/01/2019 1045   HDL 65 06/01/2019 1045   CHOLHDL 2.3 06/01/2019 1045   VLDL 16 06/01/2019 1045   LDLCALC 66 06/01/2019 1045    Additional studies/ records that were reviewed today include:   Echocardiogram: 12/2020 IMPRESSIONS     1. Left ventricular ejection fraction, by estimation, is 60 to 65%. The  left ventricle has normal function. The left ventricle has no regional  wall motion abnormalities. There is mild left ventricular hypertrophy.  Left ventricular diastolic parameters  are  indeterminate.   2. Right ventricular systolic function is normal. The right ventricular  size is normal. Tricuspid regurgitation signal is inadequate for assessing  PA pressure.   3. The mitral valve is grossly normal. Trivial mitral valve  regurgitation.   4. The aortic valve is tricuspid. Aortic valve regurgitation is mild.   5. The inferior vena cava is normal in size with greater than 50%  respiratory variability, suggesting right atrial pressure of 3 mmHg.   Assessment:    1. Chronic diastolic heart failure (HCC)   2. Palpitations   3. Medication management   4. Essential hypertension   5. Mixed hyperlipidemia      Plan:   In order of problems listed above:  1. HFpEF - She has experienced worsening lower extremity edema and a 15 lb weight gain over the past few months.  Recently switched to Bumex by her PCP and weight has declined by 5 lbs but still 10 lbs above her baseline of 185 lbs. I will recheck labs today and if renal function remains stable, would titrate Bumex from '2mg'$  daily to '2mg'$  BID. Switching to Torsemide would also be a consideration as well. She is on Amlodipine '10mg'$  daily which could be contributing to her edema but she has been on this dose for years. Also remains on Chlorthalidone but will continue for now as she was on this with Lasix in the past. If renal function has worsened, would discontinue.  - We also reviewed the importance of limiting her sodium intake as she does consume a very high-sodium diet.   2. Palpitations - Her symptoms have overall been well-controlled. She is listed as taking Atenolol '125mg'$  daily and Lopressor 12.'5mg'$  BID. Lopressor was previously stopped to avoid dual BB therapy and I encouraged her to review her medications once back at home to make sure she was not taking this.   3. HTN - Her BP is slightly elevated at 142/80 but has been well-controlled at prior visits. Continue current medication regimen for now.   4. HLD - Followed by her PCP. She remains on Lovastatin '40mg'$  daily.    Medication Adjustments/Labs and Tests Ordered: Current medicines are reviewed at length with the patient today.  Concerns regarding medicines are outlined above.  Medication changes, Labs and Tests ordered today are listed in the Patient Instructions below. Patient Instructions  Medication Instructions:  Your physician has recommended you make the following change in your medication:  STOP Metoprolol Tartrate    *If you need a refill on your cardiac medications before your next appointment, please call your pharmacy*   Lab Work: BMET- TODAY If you have labs (blood work) drawn today and your tests are completely normal, you will receive your results only by: [place] (if you have MyChart) OR A paper copy in the mail If you  have any lab test that is abnormal or we need to change your treatment, we will call you to review the results.   Testing/Procedures: None   Follow-Up: At [facility], you and your health needs are our priority.  As part of our continuing mission to provide you with exceptional heart care, we have created designated Provider Care Teams.  These Care Teams include your primary Cardiologist (physician) and Advanced Practice Providers (APPs -  Physician Assistants and Nurse Practitioners) who all work together to provide you with the care you need, when you need it.  We recommend signing up for the patient portal called "MyChart".  Sign up information is  provided on this After Visit Summary.  MyChart is used to connect with patients for Virtual Visits (Telemedicine).  Patients are able to view lab/test results, encounter notes, upcoming appointments, etc.  Non-urgent messages can be sent to your provider as well.   To learn more about what you can do with MyChart, go to [email or web address].    Your next appointment:   2 month(s)  The format for your next appointment:   In Person  Provider:   Carlyle Dolly, MD or Bernerd Pho, PA-C   Other Instructions    Two Gram Sodium Diet 2000 mg  What is Sodium? Sodium is a mineral found naturally in many foods. The most significant source of sodium in the diet is table salt, which is about 40% sodium.  Processed, convenience, and preserved foods also contain a large amount of sodium.  The body needs only 500 mg of sodium daily to function,  A normal diet provides more than enough sodium even if you do not use salt.  Why Limit Sodium? A build up of sodium in the body can cause thirst, increased blood pressure, shortness of breath, and water retention.  Decreasing sodium in the diet can reduce edema and risk of heart attack or stroke associated with high blood pressure.  Keep in mind that there are many other factors involved in these  health problems.  Heredity, obesity, lack of exercise, cigarette smoking, stress and what you eat all play a role.  General Guidelines: Do not add salt at the table or in cooking.  One teaspoon of salt contains over 2 grams of sodium. Read food labels Avoid processed and convenience foods Ask your dietitian before eating any foods not dicussed in the menu planning guidelines Consult your physician if you wish to use a salt substitute or a sodium containing medication such as antacids.  Limit milk and milk products to 16 oz (2 cups) per day.  Shopping Hints: READ LABELS!! "Dietetic" does not necessarily mean low sodium. Salt and other sodium ingredients are often added to foods during processing.    Menu Planning Guidelines Food Group Choose More Often Avoid  Beverages (see also the milk group All fruit juices, low-sodium, salt-free vegetables juices, low-sodium carbonated beverages Regular vegetable or tomato juices, commercially softened water used for drinking or cooking  Breads and Cereals Enriched white, wheat, rye and pumpernickel bread, hard rolls and dinner rolls; muffins, cornbread and waffles; most dry cereals, cooked cereal without added salt; unsalted crackers and breadsticks; low sodium or homemade bread crumbs Bread, rolls and crackers with salted tops; quick breads; instant hot cereals; pancakes; commercial bread stuffing; self-rising flower and biscuit mixes; regular bread crumbs or cracker crumbs  Desserts and Sweets Desserts and sweets mad with mild should be within allowance Instant pudding mixes and cake mixes  Fats Butter or margarine; vegetable oils; unsalted salad dressings, regular salad dressings limited to 1 Tbs; light, sour and heavy cream Regular salad dressings containing bacon fat, bacon bits, and salt pork; snack dips made with instant soup mixes or processed cheese; salted nuts  Fruits Most fresh, frozen and canned fruits Fruits processed with salt or  sodium-containing ingredient (some dried fruits are processed with sodium sulfites        Vegetables Fresh, frozen vegetables and low- sodium canned vegetables Regular canned vegetables, sauerkraut, pickled vegetables, and others prepared in brine; frozen vegetables in sauces; vegetables seasoned with ham, bacon or salt pork  Condiments, Sauces, Miscellaneous  Salt substitute with physician's approval; pepper, herbs,  spices; vinegar, lemon or lime juice; hot pepper sauce; garlic powder, onion powder, low sodium soy sauce (1 Tbs.); low sodium condiments (ketchup, chili sauce, mustard) in limited amounts (1 tsp.) fresh ground horseradish; unsalted tortilla chips, pretzels, potato chips, popcorn, salsa (1/4 cup) Any seasoning made with salt including garlic salt, celery salt, onion salt, and seasoned salt; sea salt, rock salt, kosher salt; meat tenderizers; monosodium glutamate; mustard, regular soy sauce, barbecue, sauce, chili sauce, teriyaki sauce, steak sauce, Worcestershire sauce, and most flavored vinegars; canned gravy and mixes; regular condiments; salted snack foods, olives, picles, relish, horseradish sauce, catsup   Food preparation: Try these seasonings Meats:    Pork Sage, onion Serve with applesauce  Chicken Poultry seasoning, thyme, parsley Serve with cranberry sauce  Lamb Curry powder, rosemary, garlic, thyme Serve with mint sauce or jelly  Veal Marjoram, basil Serve with current jelly, cranberry sauce  Beef Pepper, bay leaf Serve with dry mustard, unsalted chive butter  Fish Bay leaf, dill Serve with unsalted lemon butter, unsalted parsley butter  Vegetables:    Asparagus Lemon juice   Broccoli Lemon juice   Carrots Mustard dressing parsley, mint, nutmeg, glazed with unsalted butter and sugar   Green beans Marjoram, lemon juice, nutmeg,dill seed   Tomatoes Basil, marjoram, onion   Spice /blend for [employer]" 4 tsp ground thyme 1 tsp ground sage 3 tsp ground rosemary 4 tsp  ground marjoram   Test your knowledge A product that says "Salt Free" may still contain sodium. True or False Garlic Powder and Hot Pepper Sauce an be used as alternative seasonings.True or False Processed foods have more sodium than fresh foods.  True or False Canned Vegetables have less sodium than froze True or False   WAYS TO DECREASE YOUR SODIUM INTAKE Avoid the use of added salt in cooking and at the table.  Table salt (and other prepared seasonings which contain salt) is probably one of the greatest sources of sodium in the diet.  Unsalted foods can gain flavor from the sweet, sour, and butter taste sensations of herbs and spices.  Instead of using salt for seasoning, try the following seasonings with the foods listed.  Remember: how you use them to enhance natural food flavors is limited only by your creativity... Allspice-Meat, fish, eggs, fruit, peas, red and yellow vegetables Almond Extract-Fruit baked goods Anise Seed-Sweet breads, fruit, carrots, beets, cottage cheese, cookies (tastes like licorice) Basil-Meat, fish, eggs, vegetables, rice, vegetables salads, soups, sauces Bay Leaf-Meat, fish, stews, poultry Burnet-Salad, vegetables (cucumber-like flavor) Caraway Seed-Bread, cookies, cottage cheese, meat, vegetables, cheese, rice Cardamon-Baked goods, fruit, soups Celery Powder or seed-Salads, salad dressings, sauces, meatloaf, soup, bread.Do not use  celery salt Chervil-Meats, salads, fish, eggs, vegetables, cottage cheese (parsley-like flavor) Chili Power-Meatloaf, chicken cheese, corn, eggplant, egg dishes Chives-Salads cottage cheese, egg dishes, soups, vegetables, sauces Cilantro-Salsa, casseroles Cinnamon-Baked goods, fruit, pork, lamb, chicken, carrots Cloves-Fruit, baked goods, fish, pot roast, green beans, beets, carrots Coriander-Pastry, cookies, meat, salads, cheese (lemon-orange flavor) Cumin-Meatloaf, fish,cheese, eggs, cabbage,fruit pie (caraway flavor) [employer], fruit, eggs, fish, poultry, cottage cheese, vegetables Dill Seed-Meat, cottage cheese, poultry, vegetables, fish, salads, bread Fennel Seed-Bread, cookies, apples, pork, eggs, fish, beets, cabbage, cheese, Licorice-like flavor Garlic-(buds or powder) Salads, meat, poultry, fish, bread, butter, vegetables, potatoes.Do not  use garlic salt Ginger-Fruit, vegetables, baked goods, meat, fish, poultry Horseradish Root-Meet, vegetables, butter Lemon Juice or Extract-Vegetables, fruit, tea, baked goods, fish salads Mace-Baked goods fruit, vegetables, fish, poultry (taste like nutmeg) Maple Extract-Syrups Marjoram-Meat, chicken, fish,  vegetables, breads, green salads (taste like Sage) Mint-Tea, lamb, sherbet, vegetables, desserts, carrots, cabbage Mustard, Dry or Seed-Cheese, eggs, meats, vegetables, poultry Nutmeg-Baked goods, fruit, chicken, eggs, vegetables, desserts Onion Powder-Meat, fish, poultry, vegetables, cheese, eggs, bread, rice salads (Do not use   Onion salt) Orange Extract-Desserts, baked goods Oregano-Pasta, eggs, cheese, onions, pork, lamb, fish, chicken, vegetables, green salads Paprika-Meat, fish, poultry, eggs, cheese, vegetables Parsley Flakes-Butter, vegetables, meat fish, poultry, eggs, bread, salads (certain forms may   Contain sodium Pepper-Meat fish, poultry, vegetables, eggs Peppermint Extract-Desserts, baked goods Poppy Seed-Eggs, bread, cheese, fruit dressings, baked goods, noodles, vegetables, cottage  [employer], poultry, meat, fish, cauliflower, turnips,eggs bread Saffron-Rice, bread, veal, chicken, fish, eggs Sage-Meat, fish, poultry, onions, eggplant, tomateos, pork, stews Savory-Eggs, salads, poultry, meat, rice, vegetables, soups, pork Tarragon-Meat, poultry, fish, eggs, butter, vegetables (licorice-like flavor)  Thyme-Meat, poultry, fish, eggs, vegetables, (clover-like flavor), sauces,  soups Tumeric-Salads, butter, eggs, fish, rice, vegetables (saffron-like flavor) Vanilla Extract-Baked goods, candy Vinegar-Salads, vegetables, meat marinades Walnut Extract-baked goods, candy   Signed, Erma Heritage, [place]  02/10/2021 10:42 AM    [facility] [address] [place], [place] [address] Phone: [phone] Fax: [phone]

## 2021-02-10 ENCOUNTER — Encounter: Payer: Self-pay | Admitting: Student

## 2021-02-10 ENCOUNTER — Telehealth: Payer: Self-pay | Admitting: Student

## 2021-02-10 DIAGNOSIS — Z79899 Other long term (current) drug therapy: Secondary | ICD-10-CM

## 2021-02-10 NOTE — Telephone Encounter (Signed)
   Labs were ordered by me but resulted to [facility]. Please let the patient know her kidney function remains stable since being switched to Bumex by her PCP. Electrolytes stable as well. Can titrate Bumex to '2mg'$  BID. Would take the 2nd dose in the early afternoon. Repeat BMET again in 2-3 weeks for reassessment of renal function and electrolytes and continue to follow daily weights.   Signed, Erma Heritage, PA-C 02/10/2021, 10:29 AM Pager: [phone]

## 2021-02-12 ENCOUNTER — Ambulatory Visit: Payer: Medicare Other | Admitting: Physician Assistant

## 2021-02-12 ENCOUNTER — Telehealth: Payer: Self-pay | Admitting: Student

## 2021-02-12 MED ORDER — BUMETANIDE 2 MG PO TABS: 2.0000 mg | ORAL_TABLET | Freq: Two times a day (BID) | ORAL | 4 refills | Status: DC

## 2021-02-12 NOTE — Telephone Encounter (Signed)
Called patient with test results. No answer. Left message to call back.  

## 2021-02-12 NOTE — Telephone Encounter (Signed)
Pt verbalized understanding of result note. Pt had no further questions or concerns. Medication list updated to reflect changes. Labs ordered.

## 2021-02-12 NOTE — Telephone Encounter (Signed)
New message ° ° ° °Patient calling for lab results °

## 2021-02-12 NOTE — Telephone Encounter (Signed)
See previous note

## 2021-02-12 NOTE — Addendum Note (Signed)
Addended by: Christella Scheuermann C on: 02/12/2021 01:03 PM   Modules accepted: Orders

## 2021-02-20 ENCOUNTER — Telehealth: Payer: Self-pay | Admitting: Cardiology

## 2021-02-20 NOTE — Telephone Encounter (Signed)
Clarified to pharmacy that pt is on both Bumex and Chlorthalidone.

## 2021-02-20 NOTE — Telephone Encounter (Signed)
   Bumex replaced Lasix. She has been on both Chlorthalidone and Bumex for now which is not ideal but was started by her PCP. Electrolytes and renal function were stable by recent labs, therefore would continue both for now and would readdress at follow-up.  Signed, Erma Heritage, PA-C 02/20/2021, 11:59 AM Pager: [phone]

## 2021-02-20 NOTE — Telephone Encounter (Signed)
Pharmacy is calling to get clarification if Bumetanide 2 mg is replacing Chlorthalidone '25mg'$ . Please call pharmacy at [phone].

## 2021-02-20 NOTE — Telephone Encounter (Signed)
Labs were ordered by me but resulted to [facility]. Please let the patient know her kidney function remains stable since being switched to Bumex by her PCP. Electrolytes stable as well. Can titrate Bumex to '2mg'$  BID. Would take the 2nd dose in the early afternoon. Repeat BMET again in 2-3 weeks for reassessment of renal function and electrolytes and continue to follow daily weights.    Arna Medici, PA-C 02/10/2021, 10:29 AM Pager: [phone]    Was pt put on Bumex in place of Chlorthalidone? Please advise.

## 2021-02-23 ENCOUNTER — Ambulatory Visit (HOSPITAL_COMMUNITY)
Admission: RE | Admit: 2021-02-23 | Discharge: 2021-02-23 | Disposition: A | Discharge status: Home / Self Care | Payer: Medicare Other | Source: Ambulatory Visit | Attending: Gastroenterology | Admitting: Gastroenterology

## 2021-02-23 ENCOUNTER — Encounter (HOSPITAL_COMMUNITY): Payer: Self-pay | Admitting: Speech Pathology

## 2021-02-23 ENCOUNTER — Other Ambulatory Visit: Payer: Self-pay

## 2021-02-23 ENCOUNTER — Ambulatory Visit (HOSPITAL_COMMUNITY): Payer: Medicare Other | Attending: Gastroenterology | Admitting: Speech Pathology

## 2021-02-23 DIAGNOSIS — R1312 Dysphagia, oropharyngeal phase: Secondary | ICD-10-CM | POA: Diagnosis not present

## 2021-02-23 NOTE — Therapy (Signed)
[facility], [place], [address] Phone: [phone]   Fax:  [phone]  Modified Barium Swallow  Patient Details  Name: [name] MRN: [id number] Date of Birth: 1945-12-23 No data recorded  Encounter Date: 02/23/2021   End of Session - 02/23/21 1410     Visit Number 1    Number of Visits 1    Activity Tolerance Patient tolerated treatment well            Past Medical History:  Diagnosis Date   Adenomatous colon polyp 2006   excised in 2006 & 2010Due surveillance 06/2013   Anxiety    Anxiety and depression    Asthma    Breast mass    right nipple bengn mass per patient   Breast tumor    Chest discomfort    Chronic back pain    Chronic constipation    Complication of anesthesia    Depression    Diverticulosis    Fibromyalgia    Gastroesophageal reflux disease    Hiatal hernia    History of benign esophageal tumor 2010   granular cell esophageal tumor (Dx 06/2008), resected via EMR 2011, due repeat EGD 02/2012   Hyperlipidemia    Hypertension    Insomnia    Migraines    PONV (postoperative nausea and vomiting)    Psychosis ([place])    Sleep apnea    Stop Bang score of 5. Pt said she was told by Dr. Merlene Laughter that she had "a little bit" of sleep apena, but not bad enough to treat.   Thyroid nodule    Tumor of esophagus    Past Surgical History:  Past Surgical History:  Procedure Laterality Date   ABDOMINAL HYSTERECTOMY     BIOPSY  10/14/2019   Procedure: BIOPSY;  Surgeon: Daneil Dolin, MD;  [facility];  Service: Endoscopy;;   BRAVO [place] STUDY  12/11/2011   Procedure: BRAVO [place];  Surgeon: Daneil Dolin, MD;  [facility];  Service: Endoscopy;  Laterality: N/A;   BREAST EXCISIONAL BIOPSY  1990s, 2012   Left x2-sclerosing ductal papilloma-2012   CHOLECYSTECTOMY N/A 04/09/2013   Procedure: LAPAROSCOPIC CHOLECYSTECTOMY;  Surgeon: Jamesetta So, MD;  Location: [facility];   Service: General;  Laterality: N/A;   COLONOSCOPY  06/2008, 06/2011   sigmoid tics, tubular adenoma; 2013: anal canal hemorrhoids   COLONOSCOPY  07/10/2011   Anal canal hemorrhoids likely the cause of hematochezia in the setting of constipation; otherwise normal rectum ;submucosal  petechiae in left colon of doubtful clinical significance; otherwise, normal colon   COLONOSCOPY WITH PROPOFOL N/A 11/10/2017   cancelled in pre-op   COLONOSCOPY WITH PROPOFOL N/A 07/23/2018   Procedure: COLONOSCOPY WITH PROPOFOL;  Surgeon: Daneil Dolin, MD; 1 tubular adenoma, diverticulosis in the sigmoid and descending colon, nonbleeding internal hemorrhoids.  No recommendations to repeat due to age.   ESOPHAGEAL DILATION N/A 05/08/2015   Procedure: ESOPHAGEAL DILATION;  Surgeon: Daneil Dolin, MD;  Location: [facility];  Service: Endoscopy;  Laterality: N/AVenia Minks 54/56   ESOPHAGOGASTRODUODENOSCOPY  12/11/2011   [id number] Schatzki's ring; otherwise normal/Small hiatal hernia. Antral and bulbar erosions   ESOPHAGOGASTRODUODENOSCOPY (EGD) WITH PROPOFOL N/A 05/08/2015   Dr. Gala Romney: mild erosive reflux esophagitis, non-critical Schatzki's ring s/p dilation. Hiatal hernia.    ESOPHAGOGASTRODUODENOSCOPY (EGD) WITH PROPOFOL N/A 10/14/2019   Procedure: ESOPHAGOGASTRODUODENOSCOPY (EGD) WITH PROPOFOL;  Surgeon: Daneil Dolin, MD;  Nonobstructing Schatzki ring, small  hiatal hernia, abnormal gastric mucosa s/p biopsied, normal first and second portion of the duodenum.  Pathology with chronic inactive gastritis, no H. pylori.   EUS  08/2010   Dr [facility] with EGD. Retained food. No recurrent esophageal lesion, bx negative.   IR KYPHO THORACIC WITH BONE BIOPSY  10/17/2020   IR RADIOLOGIST EVAL & MGMT  09/12/2020   LUNG BIOPSY     negative   PARATHYROIDECTOMY  April 2017   [facility].    PARATHYROIDECTOMY     POLYPECTOMY  07/23/2018   Procedure: POLYPECTOMY;  Surgeon: Daneil Dolin, MD;  [facility];  Service:  Endoscopy;;  colon   RIGHT OOPHORECTOMY     benign disease   SHOULDER ARTHROSCOPY     Right; bone spurs removed   TONSILLECTOMY     TOTAL KNEE ARTHROPLASTY  2002   Right; previous arthroscopic surgery   HPI: [name] is a 75 y.o. female with a history of  GERD, esophageal granular cell tumor s/p EMR in 2011 by Dr. Newman Pies with [facility]. Has also seen Dr. Newman Pies for uncontrolled GERD in 2014 s/p EGDs, normal GES, and a variety of PPIs. Manometry had been attempted but she was unable to tolerate the probe.  EGD in April 2021 with Dr. Gala Romney due to uncontrolled GERD/odynophagia revealing nonobstructing Schatzki's ring, small hiatal hernia, abnormal gastric mucosa (chronic inactive gastritis, no H. pylori on biopsy), normal examined duodenum.  Suspected daily Goody powders likely explain refractory upper GI symptoms.  Also with chronic constipation likely opioid induced, and adenomatous colon polyps with last colonoscopy in January 2020, no recommendations to repeat due to age. At recent GI visit Pt reports worsening dysphagia (oral stage). MBSS from January of 2022 detailed below:    MBS Jan 2022: <<MBSS completed. Pt presents with normal to mild oral phase dysphagia characterized by Pt with upper dentures and prolonged oral transit with solids with piecemeal deglutition, mildly reduced tongue base retraction with min vallecular and lateral channel residue with straw sips of thin liquid which clear with a dry swallow. Pt unable to propel pill posteriorly in oral cavity on first sip of liquid, but able on the second sip. Pill passed through the UES without delay. Pt without penetration, aspiration, or significant oral or pharyngeal residuals with consistencies and textures presented. Pt's symptoms of "choking" not replicated during today's study despite challenging Pt with increased bolus size. Recommend regular textures and thin liquids with standard aspiration and reflux precautions. No further SLP  services indicated. Pt was given my contact information should she have further questions. >> No data recorded   Assessment / Plan / Recommendation  CHL IP CLINICAL IMPRESSIONS 02/23/2021  Clinical Impression Pt presents with normal to mild oropharyngeal dysphagia with very similar presentaiton to MBS in January of 2022 (detailed above). Swallowing is characterized by prolonged oral prep, slightly decreased bolus cohesion, piecemeal swallowing and mild valleculae and pyriform residue that is cleared by a reflexive repeat swallow. Pt may have slightly worsening presentation today (increased number of swallows and increased residue) however, no penetration or aspiraiton was noted on this study and as Pt takes her time and produces additional swallows, her swallow is functional. Pill passed through oral cavity with initial sip of thin liquids and was visualized via esophageal sweep passing through without incident (radiologist present to confirm). Recommend continue regular diet with thin liquids; recommend meds whole in applesauce or yogurt if continued difficulty is reported with swallowing meds. Recommend alternate bites and sips, multiple  dry swallows and slow consumption rate. There are no further ST needs noted at this time.  SLP Visit Diagnosis Dysphagia, oropharyngeal phase (R13.12)  Attention and concentration deficit following --  Frontal lobe and executive function deficit following --  Impact on safety and function Mild aspiration risk      No flowsheet data found.   Prognosis 07/03/2020  Prognosis for Safe Diet Advancement Good  Barriers to Reach Goals --  Barriers/Prognosis Comment --    No flowsheet data found.    No flowsheet data found.    No flowsheet data found.    No flowsheet data found.         CHL IP ORAL PHASE 02/23/2021  Oral Phase Impaired  Oral - Pudding Teaspoon --  Oral - Pudding Cup --  Oral - Honey Teaspoon --  Oral - Honey Cup --  Oral - Nectar Teaspoon --   Oral - Nectar Cup --  Oral - Nectar Straw --  Oral - Thin Teaspoon Impaired mastication;Reduced posterior propulsion;Decreased bolus cohesion;Delayed oral transit  Oral - Thin Cup Impaired mastication;Reduced posterior propulsion;Decreased bolus cohesion;Delayed oral transit  Oral - Thin Straw Impaired mastication;Reduced posterior propulsion;Decreased bolus cohesion;Delayed oral transit  Oral - Puree Impaired mastication;Reduced posterior propulsion;Decreased bolus cohesion;Delayed oral transit  Oral - Mech Soft NT  Oral - Regular Impaired mastication;Reduced posterior propulsion;Decreased bolus cohesion;Delayed oral transit  Oral - Multi-Consistency NT  Oral - Pill WFL  Oral Phase - Comment --    CHL IP PHARYNGEAL PHASE 02/23/2021  Pharyngeal Phase Impaired  Pharyngeal- Pudding Teaspoon --  Pharyngeal --  Pharyngeal- Pudding Cup --  Pharyngeal --  Pharyngeal- Honey Teaspoon --  Pharyngeal --  Pharyngeal- Honey Cup --  Pharyngeal --  Pharyngeal- Nectar Teaspoon --  Pharyngeal --  Pharyngeal- Nectar Cup --  Pharyngeal --  Pharyngeal- Nectar Straw --  Pharyngeal --  Pharyngeal- Thin Teaspoon Pharyngeal residue - valleculae;Pharyngeal residue - pyriform;Lateral channel residue  Pharyngeal Material does not enter airway  Pharyngeal- Thin Cup Pharyngeal residue - valleculae;Pharyngeal residue - pyriform;Lateral channel residue  Pharyngeal Material does not enter airway  Pharyngeal- Thin Straw Pharyngeal residue - valleculae;Pharyngeal residue - pyriform;Lateral channel residue  Pharyngeal Material does not enter airway  Pharyngeal- Puree Pharyngeal residue - valleculae  Pharyngeal --  Pharyngeal- Mechanical Soft --  Pharyngeal --  Pharyngeal- Regular Pharyngeal residue - valleculae  Pharyngeal --  Pharyngeal- Multi-consistency --  Pharyngeal --  Pharyngeal- Pill --  Pharyngeal --  Pharyngeal Comment --     No flowsheet data found.  Barbera Perritt H. Roddie Mc, CCC-SLP Speech  Language Pathologist  Wende Bushy 02/23/2021, 2:11 PM                        Wende Bushy 02/23/2021, 2:11 PM  [facility] [address] [place], [place], [address] Phone: [phone]   Fax:  [phone]  Name: EMELI STECKEL MRN: [id number] Date of Birth: Jan 02, 1946

## 2021-03-01 ENCOUNTER — Other Ambulatory Visit: Payer: Self-pay

## 2021-03-01 ENCOUNTER — Other Ambulatory Visit (HOSPITAL_COMMUNITY)
Admission: RE | Admit: 2021-03-01 | Discharge: 2021-03-01 | Disposition: A | Discharge status: Home / Self Care | Payer: Medicare Other | Source: Ambulatory Visit | Attending: Student | Admitting: Student

## 2021-03-01 DIAGNOSIS — Z79899 Other long term (current) drug therapy: Secondary | ICD-10-CM | POA: Insufficient documentation

## 2021-03-01 LAB — BASIC METABOLIC PANEL
Anion gap: 10 (ref 5–15)
BUN: 15 mg/dL (ref 8–23)
CO2: 25 mmol/L (ref 22–32)
Calcium: 9.6 mg/dL (ref 8.9–10.3)
Chloride: 103 mmol/L (ref 98–111)
Creatinine, Ser: 1.02 mg/dL — ABNORMAL HIGH (ref 0.44–1.00)
GFR, Estimated: 57 mL/min — ABNORMAL LOW (ref >60)
Glucose, Bld: 140 mg/dL — ABNORMAL HIGH (ref 70–99)
Potassium: 2.8 mmol/L — ABNORMAL LOW (ref 3.5–5.1)
Sodium: 138 mmol/L (ref 135–145)

## 2021-03-02 ENCOUNTER — Other Ambulatory Visit (HOSPITAL_COMMUNITY)
Admission: RE | Admit: 2021-03-02 | Discharge: 2021-03-02 | Disposition: A | Discharge status: Home / Self Care | Payer: Medicare Other | Source: Ambulatory Visit | Attending: Cardiology | Admitting: Cardiology

## 2021-03-02 ENCOUNTER — Telehealth: Payer: Self-pay | Admitting: *Deleted

## 2021-03-02 DIAGNOSIS — Z79899 Other long term (current) drug therapy: Secondary | ICD-10-CM

## 2021-03-02 LAB — BASIC METABOLIC PANEL
Anion gap: 9 (ref 5–15)
BUN: 16 mg/dL (ref 8–23)
CO2: 27 mmol/L (ref 22–32)
Calcium: 9.6 mg/dL (ref 8.9–10.3)
Chloride: 103 mmol/L (ref 98–111)
Creatinine, Ser: 1.02 mg/dL — ABNORMAL HIGH (ref 0.44–1.00)
GFR, Estimated: 57 mL/min — ABNORMAL LOW (ref >60)
Glucose, Bld: 108 mg/dL — ABNORMAL HIGH (ref 70–99)
Potassium: 3.3 mmol/L — ABNORMAL LOW (ref 3.5–5.1)
Sodium: 139 mmol/L (ref 135–145)

## 2021-03-02 LAB — MAGNESIUM: Magnesium: 2 mg/dL (ref 1.7–2.4)

## 2021-03-02 MED ORDER — POTASSIUM CHLORIDE CRYS ER 20 MEQ PO TBCR: 40.0000 meq | EXTENDED_RELEASE_TABLET | Freq: Two times a day (BID) | ORAL | 3 refills | Status: DC

## 2021-03-02 NOTE — Telephone Encounter (Signed)
Pt notified of plan of care!

## 2021-03-02 NOTE — Telephone Encounter (Signed)
-----   Message from Donna Serge, NP sent at 03/01/2021 10:18 PM EDT ----- I called pt tonight and instructed her to take another 40 meq of K+  she agreed to take  Please have her come in Friday AM the 9th and repeat BMPand Mg+ level. Also ask if any edema.    Cecilie Kicks, FNP-C

## 2021-03-02 NOTE — Telephone Encounter (Signed)
-----   Message from Isaiah Serge, NP sent at 03/02/2021 12:57 PM EDT ----- Please let her know the K+ is up though not yet in normal range.  So with her edema it seems she still need diuretic we will increase her K+ to 40 meq twice a day beginning tomorrow.  Today take the 40 meq in AM and 40 meg in afternoon then for today again take 40 meq.  Just to get level within range then go to 40 meq BID  recheck BMP next Thursday.  Thanks.

## 2021-03-08 ENCOUNTER — Other Ambulatory Visit: Payer: Self-pay

## 2021-03-08 ENCOUNTER — Other Ambulatory Visit (HOSPITAL_COMMUNITY)
Admission: RE | Admit: 2021-03-08 | Discharge: 2021-03-08 | Disposition: A | Discharge status: Home / Self Care | Payer: Medicare Other | Source: Ambulatory Visit | Attending: Cardiology | Admitting: Cardiology

## 2021-03-08 DIAGNOSIS — Z79899 Other long term (current) drug therapy: Secondary | ICD-10-CM

## 2021-03-08 LAB — BASIC METABOLIC PANEL
Anion gap: 7 (ref 5–15)
BUN: 11 mg/dL (ref 8–23)
CO2: 24 mmol/L (ref 22–32)
Calcium: 9.1 mg/dL (ref 8.9–10.3)
Chloride: 106 mmol/L (ref 98–111)
Creatinine, Ser: 0.93 mg/dL (ref 0.44–1.00)
GFR, Estimated: >60 mL/min (ref >60)
Glucose, Bld: 119 mg/dL — ABNORMAL HIGH (ref 70–99)
Potassium: 4.2 mmol/L (ref 3.5–5.1)
Sodium: 137 mmol/L (ref 135–145)

## 2021-03-14 DIAGNOSIS — M546 Pain in thoracic spine: Secondary | ICD-10-CM | POA: Diagnosis not present

## 2021-03-14 DIAGNOSIS — Z79899 Other long term (current) drug therapy: Secondary | ICD-10-CM | POA: Diagnosis not present

## 2021-03-14 DIAGNOSIS — M542 Cervicalgia: Secondary | ICD-10-CM | POA: Diagnosis not present

## 2021-03-14 DIAGNOSIS — M545 Low back pain, unspecified: Secondary | ICD-10-CM | POA: Diagnosis not present

## 2021-03-14 DIAGNOSIS — M130 Polyarthritis, unspecified: Secondary | ICD-10-CM | POA: Diagnosis not present

## 2021-03-14 DIAGNOSIS — G43701 Chronic migraine without aura, not intractable, with status migrainosus: Secondary | ICD-10-CM | POA: Diagnosis not present

## 2021-03-14 DIAGNOSIS — G894 Chronic pain syndrome: Secondary | ICD-10-CM | POA: Diagnosis not present

## 2021-03-14 DIAGNOSIS — G3184 Mild cognitive impairment, so stated: Secondary | ICD-10-CM | POA: Diagnosis not present

## 2021-03-27 DIAGNOSIS — I10 Essential (primary) hypertension: Secondary | ICD-10-CM | POA: Diagnosis not present

## 2021-03-27 DIAGNOSIS — R7303 Prediabetes: Secondary | ICD-10-CM | POA: Diagnosis not present

## 2021-03-29 DIAGNOSIS — G43909 Migraine, unspecified, not intractable, without status migrainosus: Secondary | ICD-10-CM | POA: Diagnosis not present

## 2021-03-29 DIAGNOSIS — K219 Gastro-esophageal reflux disease without esophagitis: Secondary | ICD-10-CM | POA: Diagnosis not present

## 2021-03-29 DIAGNOSIS — G4700 Insomnia, unspecified: Secondary | ICD-10-CM | POA: Diagnosis not present

## 2021-03-29 DIAGNOSIS — E876 Hypokalemia: Secondary | ICD-10-CM | POA: Diagnosis not present

## 2021-03-29 DIAGNOSIS — R350 Frequency of micturition: Secondary | ICD-10-CM | POA: Diagnosis not present

## 2021-03-29 DIAGNOSIS — E785 Hyperlipidemia, unspecified: Secondary | ICD-10-CM | POA: Diagnosis not present

## 2021-03-29 DIAGNOSIS — R7303 Prediabetes: Secondary | ICD-10-CM | POA: Diagnosis not present

## 2021-03-29 DIAGNOSIS — N1831 Chronic kidney disease, stage 3a: Secondary | ICD-10-CM | POA: Diagnosis not present

## 2021-03-29 DIAGNOSIS — N390 Urinary tract infection, site not specified: Secondary | ICD-10-CM | POA: Diagnosis not present

## 2021-03-29 DIAGNOSIS — D751 Secondary polycythemia: Secondary | ICD-10-CM | POA: Diagnosis not present

## 2021-03-29 DIAGNOSIS — R609 Edema, unspecified: Secondary | ICD-10-CM | POA: Diagnosis not present

## 2021-03-29 DIAGNOSIS — I10 Essential (primary) hypertension: Secondary | ICD-10-CM | POA: Diagnosis not present

## 2021-04-01 DIAGNOSIS — I5032 Chronic diastolic (congestive) heart failure: Secondary | ICD-10-CM | POA: Insufficient documentation

## 2021-04-01 DIAGNOSIS — G894 Chronic pain syndrome: Secondary | ICD-10-CM | POA: Insufficient documentation

## 2021-04-11 NOTE — Progress Notes (Signed)
Cardiology Office Note    Date:  04/12/2021   ID:  Donna, Houston [birth year], MRN [id number]  PCP:  Celene Squibb, MD  Cardiologist: Carlyle Dolly, MD    Chief Complaint  Patient presents with   Follow-up    2 month visit    History of Present Illness:    [name] is a 75 y.o. female with past medical history of HFpEF (edema possibly due to lymphedema), palpitations, HTN and HLD who presents to the office today for 31-month follow-up.   She was last examined by myself in 01/2021 and reported worsening lower extremity edema over the past few months and weight had peaked at 200 lbs on her home scales with Lasix being switched to Bumex by her PCP. She was consuming fast food several times a week and the importance of reducing sodium intake was reviewed. She was still 10 lbs above her baseline weight and Bumex was titrated from 2mg  daily to 2mg  BID.   In talking with the patient and her sister today, the patient reports her Bumex was cut back to once daily by her PCP due to an elevated creatinine but she feels like labs were obtained over 2 months ago. Creatinine was stable at 0.93 in 02/2021 but she thinks she had already reduced Bumex to once daily dosing at that time. She continues to have lower extremity edema and says her weight has been unchanged on her home scales but this has declined by at least 4 pounds since her last visit and possibly by up to 8 pounds as her weight had peaked above 200 lbs. Denies any worsening dyspnea on exertion or associated chest pain. No recent orthopnea, PND or palpitations.  She has been consuming almost a gallon of water a day at the instruction of her PCP. She does not add salt to food but does consume fast food routinely as she is unable to cook for her and her daughter.  Past Medical History:  Diagnosis Date   Adenomatous colon polyp 2006   excised in 2006 & 2010Due surveillance 06/2013   Anxiety    Anxiety and depression    Asthma     Breast mass    right nipple bengn mass per patient   Breast tumor    Chest discomfort    Chronic back pain    Chronic constipation    Complication of anesthesia    Depression    Diverticulosis    Fibromyalgia    Gastroesophageal reflux disease    Hiatal hernia    History of benign esophageal tumor 2010   granular cell esophageal tumor (Dx 06/2008), resected via EMR 2011, due repeat EGD 02/2012   Hyperlipidemia    Hypertension    Insomnia    Migraines    PONV (postoperative nausea and vomiting)    Psychosis ([place])    Sleep apnea    Stop Bang score of 5. Pt said she was told by Dr. Merlene Laughter that she had "a little bit" of sleep apena, but not bad enough to treat.   Thyroid nodule    Tumor of esophagus     Past Surgical History:  Procedure Laterality Date   ABDOMINAL HYSTERECTOMY     BIOPSY  10/14/2019   Procedure: BIOPSY;  Surgeon: Daneil Dolin, MD;  [facility];  Service: Endoscopy;;   BRAVO [place] STUDY  12/11/2011   Procedure: BRAVO [place];  Surgeon: Daneil Dolin, MD;  Location: [facility];  Service: Endoscopy;  Laterality: N/A;   BREAST EXCISIONAL BIOPSY  1990s, 2012   Left x2-sclerosing ductal papilloma-2012   CHOLECYSTECTOMY N/A 04/09/2013   Procedure: LAPAROSCOPIC CHOLECYSTECTOMY;  Surgeon: Jamesetta So, MD;  Location: [facility];  Service: General;  Laterality: N/A;   COLONOSCOPY  06/2008, 06/2011   sigmoid tics, tubular adenoma; 2013: anal canal hemorrhoids   COLONOSCOPY  07/10/2011   Anal canal hemorrhoids likely the cause of hematochezia in the setting of constipation; otherwise normal rectum ;submucosal  petechiae in left colon of doubtful clinical significance; otherwise, normal colon   COLONOSCOPY WITH PROPOFOL N/A 11/10/2017   cancelled in pre-op   COLONOSCOPY WITH PROPOFOL N/A 07/23/2018   Procedure: COLONOSCOPY WITH PROPOFOL;  Surgeon: Daneil Dolin, MD; 1 tubular adenoma, diverticulosis in the sigmoid and descending colon, nonbleeding internal  hemorrhoids.  No recommendations to repeat due to age.   ESOPHAGEAL DILATION N/A 05/08/2015   Procedure: ESOPHAGEAL DILATION;  Surgeon: Daneil Dolin, MD;  Location: [facility];  Service: Endoscopy;  Laterality: N/AVenia Minks 54/56   ESOPHAGOGASTRODUODENOSCOPY  12/11/2011   HQI:[id number] Schatzki's ring; otherwise normal/Small hiatal hernia. Antral and bulbar erosions   ESOPHAGOGASTRODUODENOSCOPY (EGD) WITH PROPOFOL N/A 05/08/2015   Dr. Gala Romney: mild erosive reflux esophagitis, non-critical Schatzki's ring s/p dilation. Hiatal hernia.    ESOPHAGOGASTRODUODENOSCOPY (EGD) WITH PROPOFOL N/A 10/14/2019   Procedure: ESOPHAGOGASTRODUODENOSCOPY (EGD) WITH PROPOFOL;  Surgeon: Daneil Dolin, MD;  Nonobstructing Schatzki ring, small hiatal hernia, abnormal gastric mucosa s/p biopsied, normal first and second portion of the duodenum.  Pathology with chronic inactive gastritis, no H. pylori.   EUS  08/2010   Dr [facility] with EGD. Retained food. No recurrent esophageal lesion, bx negative.   IR KYPHO THORACIC WITH BONE BIOPSY  10/17/2020   IR RADIOLOGIST EVAL & MGMT  09/12/2020   LUNG BIOPSY     negative   PARATHYROIDECTOMY  April 2017   [facility].    PARATHYROIDECTOMY     POLYPECTOMY  07/23/2018   Procedure: POLYPECTOMY;  Surgeon: Daneil Dolin, MD;  [facility];  Service: Endoscopy;;  colon   RIGHT OOPHORECTOMY     benign disease   SHOULDER ARTHROSCOPY     Right; bone spurs removed   TONSILLECTOMY     TOTAL KNEE ARTHROPLASTY  2002   Right; previous arthroscopic surgery    Current Medications: Outpatient Medications Prior to Visit  Medication Sig Dispense Refill   AIMOVIG 70 MG/ML SOAJ Inject 70 mg into the skin every 30 (thirty) days.     albuterol (PROVENTIL HFA;VENTOLIN HFA) 108 (90 BASE) MCG/ACT inhaler Inhale 2 puffs into the lungs every 6 (six) hours as needed for wheezing or shortness of breath.      amLODipine (NORVASC) 10 MG tablet Take 10 mg by mouth daily.      Aspirin-Acetaminophen-Caffeine (GOODY HEADACHE PO) Take 1 packet by mouth daily as needed (headaches/pain).     atenolol (TENORMIN) 100 MG tablet TAKE 1 TABLET BY MOUTH  DAILY 90 tablet 3   atenolol (TENORMIN) 25 MG tablet TAKE 1 TABLET BY MOUTH  DAILY IN ADDITION TO 100MG   TABLET DAILY FOR A TOTAL OF 125MG  DAILY 90 tablet 3   calcium carbonate (TUMS - DOSED IN MG ELEMENTAL CALCIUM) 500 MG chewable tablet Chew 1,500 mg by mouth daily as needed for indigestion or heartburn.     CALCIUM-VITAMIN D PO Take 1 tablet by mouth daily.     cetirizine (ZYRTEC) 10 MG tablet Take 10 mg by mouth daily as  needed for allergies.     chlorthalidone (HYGROTON) 25 MG tablet Take 1/2 tablet by mouth daily. 45 tablet 2   cycloSPORINE (RESTASIS) 0.05 % ophthalmic emulsion Place 1 drop into both eyes 2 (two) times daily as needed (dry eyes).     famotidine (PEPCID) 20 MG tablet Take 1 tablet (20 mg total) by mouth as needed for heartburn or indigestion. 60 tablet 3   ferrous sulfate 325 (65 FE) MG tablet Take 325 mg by mouth daily with breakfast.     fluticasone (FLONASE) 50 MCG/ACT nasal spray Place 1 spray into both nostrils daily as needed for allergies.      gabapentin (NEURONTIN) 100 MG capsule Take 100 mg by mouth 3 (three) times daily.     HYDROcodone-acetaminophen (NORCO/VICODIN) 5-325 MG tablet Take 1 tablet by mouth 2 (two) times daily as needed for moderate pain.     hydrOXYzine (ATARAX/VISTARIL) 25 MG tablet Take 25 mg by mouth daily.     INGREZZA 80 MG CAPS Take 80 mg by mouth daily.     lovastatin (MEVACOR) 40 MG tablet Take 40 mg by mouth at bedtime.     Melatonin 3 MG TABS Take 9 mg by mouth at bedtime.      memantine (NAMENDA) 10 MG tablet Take 10 mg by mouth 2 (two) times daily.      neomycin-bacitracin-polymyxin (NEOSPORIN) [id number] ointment Apply 1 application topically as needed (wound care).     OLANZapine (ZYPREXA) 7.5 MG tablet Take 7.5 mg by mouth at bedtime.     polyethylene glycol (MIRALAX  / GLYCOLAX) 17 g packet Take 17 g by mouth daily as needed.     RABEprazole (ACIPHEX) 20 MG tablet Take 1 tablet (20 mg total) by mouth 2 (two) times daily. 180 tablet 3   Semaglutide (RYBELSUS) 7 MG TABS Take 7 mg by mouth daily.     traZODone (DESYREL) 100 MG tablet Take 300 mg by mouth at bedtime.   3   TRULANCE 3 MG TABS TAKE 1 TABLET BY MOUTH  DAILY 90 tablet 3   venlafaxine XR (EFFEXOR-XR) 150 MG 24 hr capsule Take 150 mg by mouth at bedtime. Take with 75 mg to equal 225 mg at bedtime     venlafaxine XR (EFFEXOR-XR) 75 MG 24 hr capsule Take 75 mg by mouth at bedtime. Take with 150 mg to equal 225 mg at bedtime     zolpidem (AMBIEN) 10 MG tablet Take 10 mg by mouth at bedtime.     bumetanide (BUMEX) 2 MG tablet Take 1 tablet (2 mg total) by mouth 2 (two) times daily. 60 tablet 4   potassium chloride SA (KLOR-CON) 20 MEQ tablet Take 2 tablets (40 mEq total) by mouth 2 (two) times daily. 360 tablet 3   No facility-administered medications prior to visit.     Allergies:   Elavil [amitriptyline], Abilify [aripiprazole], Codeine, Latex, Penicillins, Polyethylene glycol, Sulfonamide derivatives, Cymbalta [duloxetine hcl], and Remeron [mirtazapine]   Social History   Socioeconomic History   Marital status: Single    Spouse name: Not on file   Number of children: 1   Years of education: Not on file   Highest education level: Not on file  Occupational History   Occupation: disabled    Employer: RETIRED  Tobacco Use   Smoking status: Never   Smokeless tobacco: Never  Vaping Use   Vaping Use: Never used  Substance and Sexual Activity   Alcohol use: No    Alcohol/week: 0.0 standard drinks  Drug use: No   Sexual activity: Never  Other Topics Concern   Not on file  Social History Narrative   Not on file   Social Determinants of Health   Financial Resource Strain: Not on file  Food Insecurity: Not on file  Transportation Needs: Not on file  Physical Activity: Not on file   Stress: Not on file  Social Connections: Not on file     Family History:  The patient's family history includes Alcohol abuse in her father; Anxiety disorder in her mother; Colon cancer in her paternal aunt and paternal uncle; Dementia in her maternal uncle; Depression in her mother; Heart attack in her mother; Stroke in her father.   Review of Systems:    Please see the history of present illness.     All other systems reviewed and are otherwise negative except as noted above.   Physical Exam:    VS:  BP (!) 143/84   Pulse 67   Ht 5\' 2"  (1.575 m)   Wt 191 lb 3.2 oz (86.7 kg)   BMI 34.97 kg/m    General: Pleasant elderly female appearing in no acute distress. Head: Normocephalic, atraumatic. Neck: No carotid bruits. JVD not elevated.  Lungs: Respirations regular and unlabored, without wheezes or rales.  Heart: Regular rate and rhythm. No S3 or S4.  No murmur, no rubs, or gallops appreciated. Abdomen: Appears non-distended. No obvious abdominal masses. Msk:  Strength and tone appear normal for age. No obvious joint deformities or effusions. Extremities: No clubbing or cyanosis. Chronic appearing lymphedema.  Distal pedal pulses are 2+ bilaterally. Neuro: Alert and oriented X 3. Moves all extremities spontaneously. No focal deficits noted. Psych:  Responds to questions appropriately with a normal affect. Skin: No rashes or lesions noted  Wt Readings from Last 3 Encounters:  04/12/21 191 lb 3.2 oz (86.7 kg)  02/09/21 195 lb (88.5 kg)  02/07/21 201 lb (91.2 kg)     Studies/Labs Reviewed:   EKG:  EKG is not ordered today.    Recent Labs: 10/17/2020: Hemoglobin 13.5; Platelets 162 03/02/2021: Magnesium 2.0 03/08/2021: BUN 11; Creatinine, Ser 0.93; Potassium 4.2; Sodium 137   Lipid Panel    Component Value Date/Time   CHOL 147 06/01/2019 1045   TRIG 79 06/01/2019 1045   HDL 65 06/01/2019 1045   CHOLHDL 2.3 06/01/2019 1045   VLDL 16 06/01/2019 1045   LDLCALC 66  06/01/2019 1045    Additional studies/ records that were reviewed today include:   Echocardiogram: 12/2020 IMPRESSIONS     1. Left ventricular ejection fraction, by estimation, is 60 to 65%. The  left ventricle has normal function. The left ventricle has no regional  wall motion abnormalities. There is mild left ventricular hypertrophy.  Left ventricular diastolic parameters  are indeterminate.   2. Right ventricular systolic function is normal. The right ventricular  size is normal. Tricuspid regurgitation signal is inadequate for assessing  PA pressure.   3. The mitral valve is grossly normal. Trivial mitral valve  regurgitation.   4. The aortic valve is tricuspid. Aortic valve regurgitation is mild.   5. The inferior vena cava is normal in size with greater than 50%  respiratory variability, suggesting right atrial pressure of 3 mmHg.   Assessment:    1. Bilateral leg edema   2. Medication management   3. Palpitations   4. Essential hypertension   5. Mixed hyperlipidemia      Plan:   In order of problems listed above:  1. Edema likely due to Lymphedema/HFpEF - It is listed in her prior history that she has diastolic dysfunction but by review of her most recent echocardiogram, diastolic parameters were indeterminate but she did have mild LVH.  She continues to have lower extremity edema but denies any associated orthopnea or PND.  I will request a copy of labs from her PCP to see what her creatinine peaked at as she is currently only taking Bumex 2 mg daily. I did recommend that she increase this to 3 mg daily and we will recheck a close follow-up BMET in 7-10 days. Will also check BNP. If edema persists, would recommend switching to Torsemide as she was previously unresponsive to Lasix and is still at least 10 lbs about her baseline. She has also been on Amlodipine 10 mg daily which could be contributing but has been on this for several years without reported side effects.   - I did recommend that she reduce her fluid intake to approximately 2 L/day and continue to try to limit sodium intake is much as possible.  2. Palpitations - She denies any recurrent palpitations since recent titration of Atenolol. She has also reduced her caffeine intake in the interim. Continue Atenolol 125 mg daily.  3. HTN - Her BP is elevated at 143/84 and will follow this with dose adjustment of her diuretic.  Continue current medication regimen for now with Amlodipine 10 mg daily, Atenolol 125 mg daily and Chlorthalidone 12.5 mg daily.    4. HLD - Will request a copy of most recent labs from Dr. Nevada Crane as outlined above. Continue Lovastatin 40 mg daily.    Medication Adjustments/Labs and Tests Ordered: Current medicines are reviewed at length with the patient today.  Concerns regarding medicines are outlined above.  Medication changes, Labs and Tests ordered today are listed in the Patient Instructions below. Patient Instructions  Medication Instructions:  1.Take potassium 40 meq (2 tablets) by mouth in the morning and 20 meq (1 tablet) by mouth in the evening 2.Take bumex 3 mg by mouth daily, this will be one and one-half tablets of the 2 mg *If you need a refill on your cardiac medications before your next appointment, please call your pharmacy*   Lab Work: BMET in 2 weeks If you have labs (blood work) drawn today and your tests are completely normal, you will receive your results only by: [place] (if you have MyChart) OR A paper copy in the mail If you have any lab test that is abnormal or we need to change your treatment, we will call you to review the results.    Follow-Up: At [facility], you and your health needs are our priority.  As part of our continuing mission to provide you with exceptional heart care, we have created designated Provider Care Teams.  These Care Teams include your primary Cardiologist (physician) and Advanced Practice Providers (APPs -   Physician Assistants and Nurse Practitioners) who all work together to provide you with the care you need, when you need it.  We recommend signing up for the patient portal called "MyChart".  Sign up information is provided on this After Visit Summary.  MyChart is used to connect with patients for Virtual Visits (Telemedicine).  Patients are able to view lab/test results, encounter notes, upcoming appointments, etc.  Non-urgent messages can be sent to your provider as well.   To learn more about what you can do with MyChart, go to [email or web address].    Your next appointment:  3 month(s)  The format for your next appointment:   In Person  Provider:   Carlyle Dolly, MD or Bernerd Pho, PA-C    Signed, Erma Heritage, PA-C  04/12/2021 5:25 PM    [facility] [address] [place], [place] [address] Phone: [phone] Fax: [phone]

## 2021-04-12 ENCOUNTER — Encounter: Payer: Self-pay | Admitting: *Deleted

## 2021-04-12 ENCOUNTER — Encounter: Payer: Self-pay | Admitting: Student

## 2021-04-12 ENCOUNTER — Other Ambulatory Visit: Payer: Self-pay

## 2021-04-12 ENCOUNTER — Ambulatory Visit: Payer: Medicare Other | Admitting: Student

## 2021-04-12 VITALS — BP 143/84 | HR 67 | Ht 62.0 in | Wt 191.2 lb

## 2021-04-12 DIAGNOSIS — I10 Essential (primary) hypertension: Secondary | ICD-10-CM | POA: Diagnosis not present

## 2021-04-12 DIAGNOSIS — E876 Hypokalemia: Secondary | ICD-10-CM | POA: Diagnosis not present

## 2021-04-12 DIAGNOSIS — E782 Mixed hyperlipidemia: Secondary | ICD-10-CM | POA: Diagnosis not present

## 2021-04-12 DIAGNOSIS — E039 Hypothyroidism, unspecified: Secondary | ICD-10-CM | POA: Diagnosis not present

## 2021-04-12 DIAGNOSIS — Z79899 Other long term (current) drug therapy: Secondary | ICD-10-CM

## 2021-04-12 DIAGNOSIS — R600 Localized edema: Secondary | ICD-10-CM | POA: Diagnosis not present

## 2021-04-12 DIAGNOSIS — R002 Palpitations: Secondary | ICD-10-CM | POA: Diagnosis not present

## 2021-04-12 MED ORDER — POTASSIUM CHLORIDE CRYS ER 20 MEQ PO TBCR: EXTENDED_RELEASE_TABLET | ORAL | 3 refills | Status: DC

## 2021-04-12 MED ORDER — BUMETANIDE 2 MG PO TABS: 3.0000 mg | ORAL_TABLET | Freq: Every day | ORAL | 5 refills | Status: DC

## 2021-04-12 NOTE — Patient Instructions (Addendum)
Medication Instructions:  1.Take potassium 40 meq (2 tablets) by mouth in the morning and 20 meq (1 tablet) by mouth in the evening 2.Take bumex 3 mg by mouth daily, this will be one and one-half tablets of the 2 mg *If you need a refill on your cardiac medications before your next appointment, please call your pharmacy*   Lab Work: BMET in 2 weeks If you have labs (blood work) drawn today and your tests are completely normal, you will receive your results only by: [place] (if you have MyChart) OR A paper copy in the mail If you have any lab test that is abnormal or we need to change your treatment, we will call you to review the results.    Follow-Up: At [facility], you and your health needs are our priority.  As part of our continuing mission to provide you with exceptional heart care, we have created designated Provider Care Teams.  These Care Teams include your primary Cardiologist (physician) and Advanced Practice Providers (APPs -  Physician Assistants and Nurse Practitioners) who all work together to provide you with the care you need, when you need it.  We recommend signing up for the patient portal called "MyChart".  Sign up information is provided on this After Visit Summary.  MyChart is used to connect with patients for Virtual Visits (Telemedicine).  Patients are able to view lab/test results, encounter notes, upcoming appointments, etc.  Non-urgent messages can be sent to your provider as well.   To learn more about what you can do with MyChart, go to [email or web address].    Your next appointment:   3 month(s)  The format for your next appointment:   In Person  Provider:   Carlyle Dolly, MD or Bernerd Pho, PA-C

## 2021-04-16 DIAGNOSIS — G894 Chronic pain syndrome: Secondary | ICD-10-CM | POA: Diagnosis not present

## 2021-04-19 ENCOUNTER — Ambulatory Visit (INDEPENDENT_AMBULATORY_CARE_PROVIDER_SITE_OTHER): Payer: Medicare Other | Admitting: Orthopedic Surgery

## 2021-04-19 ENCOUNTER — Encounter: Payer: Self-pay | Admitting: Orthopedic Surgery

## 2021-04-19 ENCOUNTER — Other Ambulatory Visit: Payer: Self-pay

## 2021-04-19 VITALS — BP 134/84 | HR 65 | Ht 62.0 in | Wt 193.2 lb

## 2021-04-19 DIAGNOSIS — M1712 Unilateral primary osteoarthritis, left knee: Secondary | ICD-10-CM

## 2021-04-19 DIAGNOSIS — M7052 Other bursitis of knee, left knee: Secondary | ICD-10-CM | POA: Diagnosis not present

## 2021-04-19 NOTE — Progress Notes (Signed)
Chief Complaint  Patient presents with   Knee Pain    LEFT   Gay Filler has chronic pain in the left knee and chronic pes bursitis   She is having constant pain but its intensity seems to have increased and decreased  She consented for injections in the left knee joint and the bursa of the left knee  Procedure note  Injection  Verbal consent was obtained to inject the left knee joint  Timeout procedure was completed to confirm injection site  Diagnosis osteoarthritis left knee  Medications used Depo-Medrol Lidocaine 1% plain 3 cc  Anesthesia was provided by ethyl chloride spray  Prep was performed with alcohol  Technique of injection 90 degrees of flexion lateral portal  No complications were noted  Procedure note Left knee injection for bursitis   verbal consent was obtained to inject Left knee PES BURSA  Timeout was completed to confirm the site of injection  The medications used were 40 mg of Depo-Medrol and 1% lidocaine 3 cc  Anesthesia was provided by ethyl chloride and the skin was prepped with alcohol.  After cleaning the skin with alcohol a 25-gauge needle was used to inject the left knee bursa   There were no complications and a sterile bandage was applied

## 2021-04-23 DIAGNOSIS — E785 Hyperlipidemia, unspecified: Secondary | ICD-10-CM | POA: Diagnosis not present

## 2021-04-23 DIAGNOSIS — I10 Essential (primary) hypertension: Secondary | ICD-10-CM | POA: Diagnosis not present

## 2021-05-10 DIAGNOSIS — G4700 Insomnia, unspecified: Secondary | ICD-10-CM | POA: Diagnosis not present

## 2021-05-10 DIAGNOSIS — E876 Hypokalemia: Secondary | ICD-10-CM | POA: Diagnosis not present

## 2021-05-10 DIAGNOSIS — N1831 Chronic kidney disease, stage 3a: Secondary | ICD-10-CM | POA: Diagnosis not present

## 2021-05-10 DIAGNOSIS — K219 Gastro-esophageal reflux disease without esophagitis: Secondary | ICD-10-CM | POA: Diagnosis not present

## 2021-05-10 DIAGNOSIS — G43909 Migraine, unspecified, not intractable, without status migrainosus: Secondary | ICD-10-CM | POA: Diagnosis not present

## 2021-05-10 DIAGNOSIS — R609 Edema, unspecified: Secondary | ICD-10-CM | POA: Diagnosis not present

## 2021-05-10 DIAGNOSIS — I10 Essential (primary) hypertension: Secondary | ICD-10-CM | POA: Diagnosis not present

## 2021-05-10 DIAGNOSIS — R1312 Dysphagia, oropharyngeal phase: Secondary | ICD-10-CM | POA: Diagnosis not present

## 2021-05-10 DIAGNOSIS — R350 Frequency of micturition: Secondary | ICD-10-CM | POA: Diagnosis not present

## 2021-05-17 ENCOUNTER — Other Ambulatory Visit: Payer: Self-pay | Admitting: Gastroenterology

## 2021-05-22 ENCOUNTER — Telehealth: Payer: Self-pay

## 2021-05-22 NOTE — Telephone Encounter (Signed)
Pt notified and voiced understanding 

## 2021-05-22 NOTE — Telephone Encounter (Signed)
-----   Message from Arnoldo Lenis, MD sent at 05/21/2021  8:17 AM EST ----- Labs look fine   Zandra Abts MD

## 2021-06-04 DIAGNOSIS — M546 Pain in thoracic spine: Secondary | ICD-10-CM | POA: Diagnosis not present

## 2021-06-04 DIAGNOSIS — M545 Low back pain, unspecified: Secondary | ICD-10-CM | POA: Diagnosis not present

## 2021-06-04 DIAGNOSIS — K5903 Drug induced constipation: Secondary | ICD-10-CM | POA: Diagnosis not present

## 2021-06-04 DIAGNOSIS — Z79899 Other long term (current) drug therapy: Secondary | ICD-10-CM | POA: Diagnosis not present

## 2021-06-04 DIAGNOSIS — M542 Cervicalgia: Secondary | ICD-10-CM | POA: Diagnosis not present

## 2021-06-04 DIAGNOSIS — G894 Chronic pain syndrome: Secondary | ICD-10-CM | POA: Diagnosis not present

## 2021-06-04 DIAGNOSIS — G3184 Mild cognitive impairment, so stated: Secondary | ICD-10-CM | POA: Diagnosis not present

## 2021-06-04 DIAGNOSIS — G43701 Chronic migraine without aura, not intractable, with status migrainosus: Secondary | ICD-10-CM | POA: Diagnosis not present

## 2021-06-04 DIAGNOSIS — M130 Polyarthritis, unspecified: Secondary | ICD-10-CM | POA: Diagnosis not present

## 2021-07-05 DIAGNOSIS — I10 Essential (primary) hypertension: Secondary | ICD-10-CM | POA: Diagnosis not present

## 2021-07-05 DIAGNOSIS — R7303 Prediabetes: Secondary | ICD-10-CM | POA: Diagnosis not present

## 2021-07-10 DIAGNOSIS — G43909 Migraine, unspecified, not intractable, without status migrainosus: Secondary | ICD-10-CM | POA: Diagnosis not present

## 2021-07-10 DIAGNOSIS — R350 Frequency of micturition: Secondary | ICD-10-CM | POA: Diagnosis not present

## 2021-07-10 DIAGNOSIS — R609 Edema, unspecified: Secondary | ICD-10-CM | POA: Diagnosis not present

## 2021-07-10 DIAGNOSIS — R1312 Dysphagia, oropharyngeal phase: Secondary | ICD-10-CM | POA: Diagnosis not present

## 2021-07-10 DIAGNOSIS — N1831 Chronic kidney disease, stage 3a: Secondary | ICD-10-CM | POA: Diagnosis not present

## 2021-07-10 DIAGNOSIS — G4700 Insomnia, unspecified: Secondary | ICD-10-CM | POA: Diagnosis not present

## 2021-07-10 DIAGNOSIS — K219 Gastro-esophageal reflux disease without esophagitis: Secondary | ICD-10-CM | POA: Diagnosis not present

## 2021-07-10 DIAGNOSIS — I10 Essential (primary) hypertension: Secondary | ICD-10-CM | POA: Diagnosis not present

## 2021-07-10 DIAGNOSIS — E876 Hypokalemia: Secondary | ICD-10-CM | POA: Diagnosis not present

## 2021-07-20 ENCOUNTER — Ambulatory Visit: Payer: Medicare Other | Admitting: Cardiology

## 2021-07-20 ENCOUNTER — Encounter: Payer: Self-pay | Admitting: Cardiology

## 2021-07-20 VITALS — BP 142/82 | HR 68 | Ht 62.0 in | Wt 184.0 lb

## 2021-07-20 DIAGNOSIS — E782 Mixed hyperlipidemia: Secondary | ICD-10-CM | POA: Diagnosis not present

## 2021-07-20 DIAGNOSIS — I10 Essential (primary) hypertension: Secondary | ICD-10-CM | POA: Diagnosis not present

## 2021-07-20 DIAGNOSIS — I5032 Chronic diastolic (congestive) heart failure: Secondary | ICD-10-CM

## 2021-07-20 MED ORDER — FUROSEMIDE 80 MG PO TABS: ORAL_TABLET | ORAL | 3 refills | Status: DC

## 2021-07-20 NOTE — Progress Notes (Signed)
Clinical Summary [name] is a 76 y.o.female seen today for follow up of the following medical problems.    1. HTN   - chrothalidone 25mg  daily caused uptrend in Cr, lowered to 12.5mg  daily - some issues with low K, we adjusted her supplement in 02/2019    - compliant with meds but has not taken meds yet today     2. Chronic diastolic heart failure - echo 08/2014 LVEF 60-65%, abnormal diastolic function indeterminate grade.     12/2020 LVEF 17-61%, indet diastolic   - chronic stable swelling - stopped bumex. She restarted lasix on her own, taking lasix 80mg  in AM and 40mg  in PM - bumex caused increased incontinence.    3.Hyperlipidemia - 03/2021 TC 148 TG 96 HDL 54 LDL 76 - she is on lovastatin  Past Medical History:  Diagnosis Date   Adenomatous colon polyp 2006   excised in 2006 & 2010Due surveillance 06/2013   Anxiety    Anxiety and depression    Asthma    Breast mass    right nipple bengn mass per patient   Breast tumor    Chest discomfort    Chronic back pain    Chronic constipation    Complication of anesthesia    Depression    Diverticulosis    Fibromyalgia    Gastroesophageal reflux disease    Hiatal hernia    History of benign esophageal tumor 2010   granular cell esophageal tumor (Dx 06/2008), resected via EMR 2011, due repeat EGD 02/2012   Hyperlipidemia    Hypertension    Insomnia    Migraines    PONV (postoperative nausea and vomiting)    Psychosis ([place])    Sleep apnea    Stop Bang score of 5. Pt said she was told by Dr. Merlene Laughter that she had "a little bit" of sleep apena, but not bad enough to treat.   Thyroid nodule    Tumor of esophagus      Allergies  Allergen Reactions   Elavil [Amitriptyline] Other (See Comments)    Felt really nervous and felt like something was hold her feet or arm and/ or AM headache on it and back on it and went away off it.    Abilify [Aripiprazole] Other (See Comments)    Dystonic reaction   Codeine Hives,  Nausea Only and Other (See Comments)    Feels funny   Latex Hives   Penicillins Hives, Itching and Other (See Comments)    Has patient had a PCN reaction causing immediate rash, facial/tongue/throat swelling, SOB or lightheadedness with hypotension: Yes Has patient had a PCN reaction causing severe rash involving mucus membranes or skin necrosis: No Has patient had a PCN reaction that required hospitalization No Has patient had a PCN reaction occurring within the last 10 years: No If all of the above answers are "NO", then may proceed with Cephalosporin use.    Polyethylene Glycol Other (See Comments)    Per allergy test   Sulfonamide Derivatives Nausea And Vomiting   Cymbalta [Duloxetine Hcl] Rash   Remeron [Mirtazapine] Other (See Comments)    Caused lots of strange, weird, crazy dreams.     Current Outpatient Medications  Medication Sig Dispense Refill   AIMOVIG 70 MG/ML SOAJ Inject 70 mg into the skin every 30 (thirty) days.     albuterol (PROVENTIL HFA;VENTOLIN HFA) 108 (90 BASE) MCG/ACT inhaler Inhale 2 puffs into the lungs every 6 (six) hours as needed for wheezing  or shortness of breath.      amLODipine (NORVASC) 10 MG tablet Take 10 mg by mouth daily.     Aspirin-Acetaminophen-Caffeine (GOODY HEADACHE PO) Take 1 packet by mouth daily as needed (headaches/pain).     atenolol (TENORMIN) 100 MG tablet TAKE 1 TABLET BY MOUTH  DAILY 90 tablet 3   atenolol (TENORMIN) 25 MG tablet TAKE 1 TABLET BY MOUTH  DAILY IN ADDITION TO 100MG   TABLET DAILY FOR A TOTAL OF 125MG  DAILY 90 tablet 3   bumetanide (BUMEX) 2 MG tablet Take 1.5 tablets (3 mg total) by mouth daily. 45 tablet 5   calcium carbonate (TUMS - DOSED IN MG ELEMENTAL CALCIUM) 500 MG chewable tablet Chew 1,500 mg by mouth daily as needed for indigestion or heartburn.     CALCIUM-VITAMIN D PO Take 1 tablet by mouth daily.     cetirizine (ZYRTEC) 10 MG tablet Take 10 mg by mouth daily as needed for allergies.     chlorthalidone  (HYGROTON) 25 MG tablet Take 1/2 tablet by mouth daily. 45 tablet 2   cycloSPORINE (RESTASIS) 0.05 % ophthalmic emulsion Place 1 drop into both eyes 2 (two) times daily as needed (dry eyes).     famotidine (PEPCID) 20 MG tablet Take 1 tablet (20 mg total) by mouth as needed for heartburn or indigestion. 60 tablet 3   ferrous sulfate 325 (65 FE) MG tablet Take 325 mg by mouth daily with breakfast.     fluticasone (FLONASE) 50 MCG/ACT nasal spray Place 1 spray into both nostrils daily as needed for allergies.      gabapentin (NEURONTIN) 100 MG capsule Take 100 mg by mouth 3 (three) times daily.     HYDROcodone-acetaminophen (NORCO/VICODIN) 5-325 MG tablet Take 1 tablet by mouth 2 (two) times daily as needed for moderate pain.     hydrOXYzine (ATARAX/VISTARIL) 25 MG tablet Take 25 mg by mouth daily.     INGREZZA 80 MG CAPS Take 80 mg by mouth daily.     lovastatin (MEVACOR) 40 MG tablet Take 40 mg by mouth at bedtime.     Melatonin 3 MG TABS Take 9 mg by mouth at bedtime.      memantine (NAMENDA) 10 MG tablet Take 10 mg by mouth 2 (two) times daily.      neomycin-bacitracin-polymyxin (NEOSPORIN) [phone] ointment Apply 1 application topically as needed (wound care).     OLANZapine (ZYPREXA) 7.5 MG tablet Take 7.5 mg by mouth at bedtime.     polyethylene glycol (MIRALAX / GLYCOLAX) 17 g packet Take 17 g by mouth daily as needed.     potassium chloride SA (KLOR-CON) 20 MEQ tablet Take 2 tablets (40 meq) by mouth in the morning and take 1 tablet (20 meq) by mouth in the evening 270 tablet 3   RABEprazole (ACIPHEX) 20 MG tablet Take 1 tablet (20 mg total) by mouth 2 (two) times daily. 180 tablet 3   Semaglutide (RYBELSUS) 7 MG TABS Take 7 mg by mouth daily.     traZODone (DESYREL) 100 MG tablet Take 300 mg by mouth at bedtime.   3   TRULANCE 3 MG TABS TAKE 1 TABLET BY MOUTH  DAILY 90 tablet 2   venlafaxine XR (EFFEXOR-XR) 150 MG 24 hr capsule Take 150 mg by mouth at bedtime. Take with 75 mg to equal  225 mg at bedtime     venlafaxine XR (EFFEXOR-XR) 75 MG 24 hr capsule Take 75 mg by mouth at bedtime. Take with 150 mg to  equal 225 mg at bedtime     zolpidem (AMBIEN) 10 MG tablet Take 10 mg by mouth at bedtime.     No current facility-administered medications for this visit.     Past Surgical History:  Procedure Laterality Date   ABDOMINAL HYSTERECTOMY     BIOPSY  10/14/2019   Procedure: BIOPSY;  Surgeon: Daneil Dolin, MD;  Location: [facility];  Service: Endoscopy;;   BRAVO [place] STUDY  12/11/2011   Procedure: BRAVO [place];  Surgeon: Daneil Dolin, MD;  Location: [facility];  Service: Endoscopy;  Laterality: N/A;   BREAST EXCISIONAL BIOPSY  1990s, 2012   Left x2-sclerosing ductal papilloma-2012   CHOLECYSTECTOMY N/A 04/09/2013   Procedure: LAPAROSCOPIC CHOLECYSTECTOMY;  Surgeon: Jamesetta So, MD;  Location: [facility];  Service: General;  Laterality: N/A;   COLONOSCOPY  06/2008, 06/2011   sigmoid tics, tubular adenoma; 2013: anal canal hemorrhoids   COLONOSCOPY  07/10/2011   Anal canal hemorrhoids likely the cause of hematochezia in the setting of constipation; otherwise normal rectum ;submucosal  petechiae in left colon of doubtful clinical significance; otherwise, normal colon   COLONOSCOPY WITH PROPOFOL N/A 11/10/2017   cancelled in pre-op   COLONOSCOPY WITH PROPOFOL N/A 07/23/2018   Procedure: COLONOSCOPY WITH PROPOFOL;  Surgeon: Daneil Dolin, MD; 1 tubular adenoma, diverticulosis in the sigmoid and descending colon, nonbleeding internal hemorrhoids.  No recommendations to repeat due to age.   ESOPHAGEAL DILATION N/A 05/08/2015   Procedure: ESOPHAGEAL DILATION;  Surgeon: Daneil Dolin, MD;  Location: [facility];  Service: Endoscopy;  Laterality: N/AVenia Minks 54/56   ESOPHAGOGASTRODUODENOSCOPY  12/11/2011   [id number] Schatzki's ring; otherwise normal/Small hiatal hernia. Antral and bulbar erosions   ESOPHAGOGASTRODUODENOSCOPY (EGD) WITH PROPOFOL N/A 05/08/2015   Dr. Gala Romney:  mild erosive reflux esophagitis, non-critical Schatzki's ring s/p dilation. Hiatal hernia.    ESOPHAGOGASTRODUODENOSCOPY (EGD) WITH PROPOFOL N/A 10/14/2019   Procedure: ESOPHAGOGASTRODUODENOSCOPY (EGD) WITH PROPOFOL;  Surgeon: Daneil Dolin, MD;  Nonobstructing Schatzki ring, small hiatal hernia, abnormal gastric mucosa s/p biopsied, normal first and second portion of the duodenum.  Pathology with chronic inactive gastritis, no H. pylori.   EUS  08/2010   Dr [facility] with EGD. Retained food. No recurrent esophageal lesion, bx negative.   IR KYPHO THORACIC WITH BONE BIOPSY  10/17/2020   IR RADIOLOGIST EVAL & MGMT  09/12/2020   LUNG BIOPSY     negative   PARATHYROIDECTOMY  April 2017   [facility].    PARATHYROIDECTOMY     POLYPECTOMY  07/23/2018   Procedure: POLYPECTOMY;  Surgeon: Daneil Dolin, MD;  Location[facility];  Service: Endoscopy;;  colon   RIGHT OOPHORECTOMY     benign disease   SHOULDER ARTHROSCOPY     Right; bone spurs removed   TONSILLECTOMY     TOTAL KNEE ARTHROPLASTY  2002   Right; previous arthroscopic surgery     Allergies  Allergen Reactions   Elavil [Amitriptyline] Other (See Comments)    Felt really nervous and felt like something was hold her feet or arm and/ or AM headache on it and back on it and went away off it.    Abilify [Aripiprazole] Other (See Comments)    Dystonic reaction   Codeine Hives, Nausea Only and Other (See Comments)    Feels funny   Latex Hives   Penicillins Hives, Itching and Other (See Comments)    Has patient had a PCN reaction causing immediate rash, facial/tongue/throat swelling, SOB or lightheadedness  with hypotension: Yes Has patient had a PCN reaction causing severe rash involving mucus membranes or skin necrosis: No Has patient had a PCN reaction that required hospitalization No Has patient had a PCN reaction occurring within the last 10 years: No If all of the above answers are "NO", then may proceed with Cephalosporin use.     Polyethylene Glycol Other (See Comments)    Per allergy test   Sulfonamide Derivatives Nausea And Vomiting   Cymbalta [Duloxetine Hcl] Rash   Remeron [Mirtazapine] Other (See Comments)    Caused lots of strange, weird, crazy dreams.      Family History  Problem Relation Age of Onset   Stroke Father    Alcohol abuse Father    Heart attack Mother    Depression Mother    Anxiety disorder Mother    Colon cancer Paternal Aunt    Colon cancer Paternal Uncle    Dementia Maternal Uncle    ADD / ADHD Neg Hx    Bipolar disorder Neg Hx    Drug abuse Neg Hx    OCD Neg Hx    Paranoid behavior Neg Hx    Schizophrenia Neg Hx    Seizures Neg Hx    Sexual abuse Neg Hx    Physical abuse Neg Hx      Social History Ms. Zaragoza reports that she has never smoked. She has never used smokeless tobacco. Ms. Leon reports no history of alcohol use.   Review of Systems CONSTITUTIONAL: No weight loss, fever, chills, weakness or fatigue.  HEENT: Eyes: No visual loss, blurred vision, double vision or yellow sclerae.No hearing loss, sneezing, congestion, runny nose or sore throat.  SKIN: No rash or itching.  CARDIOVASCULAR: per hpi RESPIRATORY: No shortness of breath, cough or sputum.  GASTROINTESTINAL: No anorexia, nausea, vomiting or diarrhea. No abdominal pain or blood.  GENITOURINARY: No burning on urination, no polyuria NEUROLOGICAL: No headache, dizziness, syncope, paralysis, ataxia, numbness or tingling in the extremities. No change in bowel or bladder control.  MUSCULOSKELETAL: No muscle, back pain, joint pain or stiffness.  LYMPHATICS: No enlarged nodes. No history of splenectomy.  PSYCHIATRIC: No history of depression or anxiety.  ENDOCRINOLOGIC: No reports of sweating, cold or heat intolerance. No polyuria or polydipsia.  Marland Kitchen   Physical Examination Today's Vitals   07/20/21 1126  BP: (!) 142/82  Pulse: 68  SpO2: 98%  Weight: 184 lb (83.5 kg)  Height: 5\' 2"  (1.575 m)   Body  mass index is 33.65 kg/m.  Gen: resting comfortably, no acute distress HEENT: no scleral icterus, pupils equal round and reactive, no palptable cervical adenopathy,  CV: RRR, no m/r/g, no jvd Resp: Clear to auscultation bilaterally GI: abdomen is soft, non-tender, non-distended, normal bowel sounds, no hepatosplenomegaly MSK: extremities are warm, 1+ bilateral nonpitting edema Skin: warm, no rash Neuro:  no focal deficits Psych: appropriate affect   Diagnostic Studies 02/17/13 Event monitor: baseline sinus brady in 50s, symptoms of dizziness correlate w/ sinus brady in 67s. Occasional PVCs, 1 PVC couplet.     01/2012 Carotid US: minimal plaque at bifurcations, no stenosis     10/2014 echo Study Conclusions  - Left ventricle: The cavity size was normal. Wall thickness was   increased in a pattern of mild LVH. Systolic function was normal.   The estimated ejection fraction was in the range of 60% to 65%.   Abnormal diastoilc function, indeterminate grade. - Aortic valve: There was mild regurgitation. Valve area (VTI):   2.48 cm^2.  Valve area (Vmax): 2.89 cm^2. - Technically adequate study.    Assessment and Plan   1. HTN - elevated here but has not taken meds yet - bp's overall at goal at recent ortho visit - contniue to monitor       2. Chronic diastolic HF - chronic stable LE edema likely multifactorial, continue diuretic. Bumex causes incontience, she favors lasix, will take lasix 80mg  in AM and 40mg  in pm   3. Hyperlipidemia - at goal, continue current meds   Arnoldo Lenis, M.D.

## 2021-07-20 NOTE — Patient Instructions (Signed)
Medication Instructions:  STOP Bumex START Lasix- 80 mg in the AM, 40 mg in the PM  Follow-Up: Follow up with Dr. Harl Bowie in 6 months  If you need a refill on your cardiac medications before your next appointment, please call your pharmacy.

## 2021-07-23 ENCOUNTER — Encounter: Payer: Self-pay | Admitting: Orthopedic Surgery

## 2021-07-23 ENCOUNTER — Ambulatory Visit (INDEPENDENT_AMBULATORY_CARE_PROVIDER_SITE_OTHER): Payer: Medicare Other | Admitting: Orthopedic Surgery

## 2021-07-23 ENCOUNTER — Other Ambulatory Visit: Payer: Self-pay

## 2021-07-23 DIAGNOSIS — M7052 Other bursitis of knee, left knee: Secondary | ICD-10-CM

## 2021-07-23 DIAGNOSIS — M1712 Unilateral primary osteoarthritis, left knee: Secondary | ICD-10-CM

## 2021-07-23 NOTE — Patient Instructions (Signed)
Anderson Malta with Dr Estanislado Pandy phone number [phone]/ this was the doctor that did the Vertebroplasty procedure

## 2021-07-23 NOTE — Progress Notes (Signed)
Chief Complaint  Patient presents with   Knee Pain    LT/follow up//same    76 year old female with chronic medical problems preventing any surgical intervention presents with continued pain over the pes bursa and left knee joint  She is amenable to injections  Encounter Diagnoses  Name Primary?   Pes anserinus bursitis of left knee Yes   Primary osteoarthritis of left knee     Procedure note left knee injection   verbal consent was obtained to inject left knee joint  Timeout was completed to confirm the site of injection  The medications used were 40 mg Depo-Medrol and 3 cc 1% lidocaine  anesthesia was provided by ethyl chloride and the skin was prepped with alcohol.  After cleaning the skin with alcohol a 20-gauge needle was used to inject the left knee joint. There were no complications. A sterile bandage was applied.  Procedure note Left knee injection for bursitis   verbal consent was obtained to inject Left knee PES BURSA  Timeout was completed to confirm the site of injection  The medications used were 40 mg of Depo-Medrol and 1% lidocaine 3 cc  Anesthesia was provided by ethyl chloride and the skin was prepped with alcohol.  After cleaning the skin with alcohol a 25-gauge needle was used to inject the left knee bursa   There were no complications and a sterile bandage was applied  Follow-up as needed

## 2021-08-07 ENCOUNTER — Telehealth: Payer: Self-pay | Admitting: Cardiology

## 2021-08-07 DIAGNOSIS — G43701 Chronic migraine without aura, not intractable, with status migrainosus: Secondary | ICD-10-CM | POA: Diagnosis not present

## 2021-08-07 NOTE — Telephone Encounter (Signed)
Spoke with Star @ [facility] [employer] to clarify that Bumex was d/c at pt's last OV with Dr. Harl Bowie. Pt is now on the furosemide. Star updated pt's list and pill pack.   Spoke to pt who verbalized understanding.

## 2021-08-07 NOTE — Telephone Encounter (Signed)
Patient called stating that [facility] called her asking her which one of these medication she should be on.  Is it the Furosemide or Bumetanide? She didn't have the number to the pharmacy.

## 2021-08-11 NOTE — Progress Notes (Unsigned)
Referring Provider: Celene Squibb, MD Primary Care Physician:  Celene Squibb, MD Primary GI Physician: Dr. Gala Romney  No chief complaint on file.   HPI:   [name] is a 76 y.o. female presenting today with a history of   GERD, esophageal granular cell tumor s/p EMR in 2011 by Dr. Newman Pies with [facility]. Has also seen Dr. Newman Pies for uncontrolled GERD in 2014 s/p EGDs, normal GES, and a variety of PPIs. Manometry had been attempted but she was unable to tolerate the probe.  EGD in April 2021 with Dr. Gala Romney due to uncontrolled GERD/odynophagia revealing nonobstructing Schatzki's ring, small hiatal hernia, abnormal gastric mucosa (chronic inactive gastritis, no H. pylori on biopsy), normal examined duodenum.  Suspected daily Goody powders likely explain refractory upper GI symptoms. MBSS January 2022 with  normal to mild oral phase dysphagia,  diffuse esophageal dysmotility, no  penetration, aspiration, or significant oral or pharyngeal residuals.   Patient symptoms of "choking" not replicated during study.   Also with chronic constipation likely opioid induced, and adenomatous colon polyps with last colonoscopy in January 2020, no recommendations to repeat due to age. She is presenting today for follow-up of GERD and constipation ***.  Last seen in our office August 2022. Felt dysphagia was worsening, unable to get foods to the back of her throat to swallow, occurring with everything. Items pass down her esophagus without trouble. GERD was well controlled on rabeprazole 20 mg BID, but she ran out and was having symptoms. Bms every couple of weeks using Trulance PRN as daily use was too strong. Previously failed Linzess, Amitiza, and Movantik. Had not tried MiraLAX. No alarm symptoms. Planned to resume Aciphex BID, start MiraLAX 17 g daily,, benefiber daily, continue trulance PRN, refer to [facility] for evaluation of oropharyngeal dysphagia. Requested 4 week progress report on constipation. Follow-up in 6 months.    No progress report received.   Today:   Past Medical History:  Diagnosis Date   Adenomatous colon polyp 2006   excised in 2006 & 2010Due surveillance 06/2013   Anxiety    Anxiety and depression    Asthma    Breast mass    right nipple bengn mass per patient   Breast tumor    Chest discomfort    Chronic back pain    Chronic constipation    Complication of anesthesia    Depression    Diverticulosis    Fibromyalgia    Gastroesophageal reflux disease    Hiatal hernia    History of benign esophageal tumor 2010   granular cell esophageal tumor (Dx 06/2008), resected via EMR 2011, due repeat EGD 02/2012   Hyperlipidemia    Hypertension    Insomnia    Migraines    PONV (postoperative nausea and vomiting)    Psychosis ([place])    Sleep apnea    Stop Bang score of 5. Pt said she was told by Dr. Merlene Laughter that she had "a little bit" of sleep apena, but not bad enough to treat.   Thyroid nodule    Tumor of esophagus     Past Surgical History:  Procedure Laterality Date   ABDOMINAL HYSTERECTOMY     BIOPSY  10/14/2019   Procedure: BIOPSY;  Surgeon: Daneil Dolin, MD;  [facility]: [facility];  Service: Endoscopy;;   BRAVO [place] STUDY  12/11/2011   Procedure: BRAVO [place];  Surgeon: Daneil Dolin, MD;  Location: [facility];  Service: Endoscopy;  Laterality: N/A;  BREAST EXCISIONAL BIOPSY  1990s, 2012   Left x2-sclerosing ductal papilloma-2012   CHOLECYSTECTOMY N/A 04/09/2013   Procedure: LAPAROSCOPIC CHOLECYSTECTOMY;  Surgeon: Jamesetta So, MD;  Location: [facility];  Service: General;  Laterality: N/A;   COLONOSCOPY  06/2008, 06/2011   sigmoid tics, tubular adenoma; 2013: anal canal hemorrhoids   COLONOSCOPY  07/10/2011   Anal canal hemorrhoids likely the cause of hematochezia in the setting of constipation; otherwise normal rectum ;submucosal  petechiae in left colon of doubtful clinical significance; otherwise, normal colon   COLONOSCOPY WITH PROPOFOL N/A 11/10/2017   cancelled  in pre-op   COLONOSCOPY WITH PROPOFOL N/A 07/23/2018   Procedure: COLONOSCOPY WITH PROPOFOL;  Surgeon: Daneil Dolin, MD; 1 tubular adenoma, diverticulosis in the sigmoid and descending colon, nonbleeding internal hemorrhoids.  No recommendations to repeat due to age.   ESOPHAGEAL DILATION N/A 05/08/2015   Procedure: ESOPHAGEAL DILATION;  Surgeon: Daneil Dolin, MD;  Location: [facility];  Service: Endoscopy;  Laterality: N/AVenia Minks 54/56   ESOPHAGOGASTRODUODENOSCOPY  12/11/2011   [id number] Schatzki's ring; otherwise normal/Small hiatal hernia. Antral and bulbar erosions   ESOPHAGOGASTRODUODENOSCOPY (EGD) WITH PROPOFOL N/A 05/08/2015   Dr. Gala Romney: mild erosive reflux esophagitis, non-critical Schatzki's ring s/p dilation. Hiatal hernia.    ESOPHAGOGASTRODUODENOSCOPY (EGD) WITH PROPOFOL N/A 10/14/2019   Procedure: ESOPHAGOGASTRODUODENOSCOPY (EGD) WITH PROPOFOL;  Surgeon: Daneil Dolin, MD;  Nonobstructing Schatzki ring, small hiatal hernia, abnormal gastric mucosa s/p biopsied, normal first and second portion of the duodenum.  Pathology with chronic inactive gastritis, no H. pylori.   EUS  08/2010   Dr [facility] with EGD. Retained food. No recurrent esophageal lesion, bx negative.   IR KYPHO THORACIC WITH BONE BIOPSY  10/17/2020   IR RADIOLOGIST EVAL & MGMT  09/12/2020   LUNG BIOPSY     negative   PARATHYROIDECTOMY  April 2017   [facility].    PARATHYROIDECTOMY     POLYPECTOMY  07/23/2018   Procedure: POLYPECTOMY;  Surgeon: Daneil Dolin, MD;  Location: [facility];  Service: Endoscopy;;  colon   RIGHT OOPHORECTOMY     benign disease   SHOULDER ARTHROSCOPY     Right; bone spurs removed   TONSILLECTOMY     TOTAL KNEE ARTHROPLASTY  2002   Right; previous arthroscopic surgery    Current Outpatient Medications  Medication Sig Dispense Refill   AIMOVIG 70 MG/ML SOAJ Inject 70 mg into the skin every 30 (thirty) days.     albuterol (PROVENTIL HFA;VENTOLIN HFA) 108 (90 BASE) MCG/ACT  inhaler Inhale 2 puffs into the lungs every 6 (six) hours as needed for wheezing or shortness of breath.      amLODipine (NORVASC) 10 MG tablet Take 10 mg by mouth daily.     Aspirin-Acetaminophen-Caffeine (GOODY HEADACHE PO) Take 1 packet by mouth daily as needed (headaches/pain).     atenolol (TENORMIN) 100 MG tablet TAKE 1 TABLET BY MOUTH  DAILY 90 tablet 3   atenolol (TENORMIN) 25 MG tablet TAKE 1 TABLET BY MOUTH  DAILY IN ADDITION TO 100MG   TABLET DAILY FOR A TOTAL OF 125MG  DAILY 90 tablet 3   calcium carbonate (TUMS - DOSED IN MG ELEMENTAL CALCIUM) 500 MG chewable tablet Chew 1,500 mg by mouth daily as needed for indigestion or heartburn.     CALCIUM-VITAMIN D PO Take 1 tablet by mouth daily.     cetirizine (ZYRTEC) 10 MG tablet Take 10 mg by mouth daily as needed for allergies.     chlorthalidone (HYGROTON) 25 MG  tablet Take 1/2 tablet by mouth daily. 45 tablet 2   cycloSPORINE (RESTASIS) 0.05 % ophthalmic emulsion Place 1 drop into both eyes 2 (two) times daily as needed (dry eyes).     famotidine (PEPCID) 20 MG tablet Take 1 tablet (20 mg total) by mouth as needed for heartburn or indigestion. 60 tablet 3   ferrous sulfate 325 (65 FE) MG tablet Take 325 mg by mouth daily with breakfast.     fluticasone (FLONASE) 50 MCG/ACT nasal spray Place 1 spray into both nostrils daily as needed for allergies.      furosemide (LASIX) 80 MG tablet Take 1 tablet (80 mg total) by mouth every morning AND 0.5 tablets (40 mg total) every evening. 135 tablet 3   gabapentin (NEURONTIN) 100 MG capsule Take 100 mg by mouth 3 (three) times daily.     HYDROcodone-acetaminophen (NORCO/VICODIN) 5-325 MG tablet Take 1 tablet by mouth 2 (two) times daily as needed for moderate pain.     hydrOXYzine (ATARAX/VISTARIL) 25 MG tablet Take 25 mg by mouth daily.     INGREZZA 80 MG CAPS Take 80 mg by mouth daily.     lovastatin (MEVACOR) 40 MG tablet Take 40 mg by mouth at bedtime.     Melatonin 3 MG TABS Take 9 mg by mouth  at bedtime.      memantine (NAMENDA) 10 MG tablet Take 10 mg by mouth 2 (two) times daily.      neomycin-bacitracin-polymyxin (NEOSPORIN) [phone] ointment Apply 1 application topically as needed (wound care).     OLANZapine (ZYPREXA) 7.5 MG tablet Take 7.5 mg by mouth at bedtime.     polyethylene glycol (MIRALAX / GLYCOLAX) 17 g packet Take 17 g by mouth daily as needed.     potassium chloride SA (KLOR-CON) 20 MEQ tablet Take 2 tablets (40 meq) by mouth in the morning and take 1 tablet (20 meq) by mouth in the evening 270 tablet 3   RABEprazole (ACIPHEX) 20 MG tablet Take 1 tablet (20 mg total) by mouth 2 (two) times daily. 180 tablet 3   Semaglutide (RYBELSUS) 7 MG TABS Take 7 mg by mouth daily.     traZODone (DESYREL) 100 MG tablet Take 300 mg by mouth at bedtime.   3   TRULANCE 3 MG TABS TAKE 1 TABLET BY MOUTH  DAILY 90 tablet 2   venlafaxine XR (EFFEXOR-XR) 150 MG 24 hr capsule Take 150 mg by mouth at bedtime. Take with 75 mg to equal 225 mg at bedtime     venlafaxine XR (EFFEXOR-XR) 75 MG 24 hr capsule Take 75 mg by mouth at bedtime. Take with 150 mg to equal 225 mg at bedtime     zolpidem (AMBIEN) 10 MG tablet Take 10 mg by mouth at bedtime.     No current facility-administered medications for this visit.    Allergies as of 08/13/2021 - Review Complete 07/23/2021  Allergen Reaction Noted   Elavil [amitriptyline] Other (See Comments) 06/03/2012   Abilify [aripiprazole] Other (See Comments) 11/25/2012   Codeine Hives, Nausea Only, and Other (See Comments)    Latex Hives 11/21/2011   Penicillins Hives, Itching, and Other (See Comments)    Polyethylene glycol Other (See Comments) 12/26/2012   Sulfonamide derivatives Nausea And Vomiting    Cymbalta [duloxetine hcl] Rash 03/10/2013   Remeron [mirtazapine] Other (See Comments) 05/06/2012    Family History  Problem Relation Age of Onset   Stroke Father    Alcohol abuse Father    Heart  attack Mother    Depression Mother    Anxiety  disorder Mother    Colon cancer Paternal Aunt    Colon cancer Paternal Uncle    Dementia Maternal Uncle    ADD / ADHD Neg Hx    Bipolar disorder Neg Hx    Drug abuse Neg Hx    OCD Neg Hx    Paranoid behavior Neg Hx    Schizophrenia Neg Hx    Seizures Neg Hx    Sexual abuse Neg Hx    Physical abuse Neg Hx     Social History   Socioeconomic History   Marital status: Single    Spouse name: Not on file   Number of children: 1   Years of education: Not on file   Highest education level: Not on file  Occupational History   Occupation: disabled    Employer: RETIRED  Tobacco Use   Smoking status: Never   Smokeless tobacco: Never  Vaping Use   Vaping Use: Never used  Substance and Sexual Activity   Alcohol use: No    Alcohol/week: 0.0 standard drinks   Drug use: No   Sexual activity: Never  Other Topics Concern   Not on file  Social History Narrative   Not on file   Social Determinants of Health   Financial Resource Strain: Not on file  Food Insecurity: Not on file  Transportation Needs: Not on file  Physical Activity: Not on file  Stress: Not on file  Social Connections: Not on file    Review of Systems: Gen: Denies fever, chills, anorexia. Denies fatigue, weakness, weight loss.  CV: Denies chest pain, palpitations, syncope, peripheral edema, and claudication. Resp: Denies dyspnea at rest, cough, wheezing, coughing up blood, and pleurisy. GI: Denies vomiting blood, jaundice, and fecal incontinence.   Denies dysphagia or odynophagia. Derm: Denies rash, itching, dry skin Psych: Denies depression, anxiety, memory loss, confusion. No homicidal or suicidal ideation.  Heme: Denies bruising, bleeding, and enlarged lymph nodes.  Physical Exam: There were no vitals taken for this visit. General:   Alert and oriented. No distress noted. Pleasant and cooperative.  Head:  Normocephalic and atraumatic. Eyes:  Conjuctiva clear without scleral icterus. Mouth:  Oral mucosa  pink and moist. Good dentition. No lesions. Heart:  S1, S2 present without murmurs appreciated. Lungs:  Clear to auscultation bilaterally. No wheezes, rales, or rhonchi. No distress.  Abdomen:  +BS, soft, non-tender and non-distended. No rebound or guarding. No HSM or masses noted. Msk:  Symmetrical without gross deformities. Normal posture. Extremities:  Without edema. Neurologic:  Alert and  oriented x4 Psych:  Alert and cooperative. Normal mood and affect.

## 2021-08-13 ENCOUNTER — Ambulatory Visit: Payer: Medicare Other | Admitting: Gastroenterology

## 2021-08-13 NOTE — Progress Notes (Signed)
Primary Care Physician:  Celene Squibb, MD  Primary GI: Dr. Gala Romney  Patient Location: Home   Provider Location: [facility] office   Reason for Visit: 70-month follow-up of GERD and constipation.   Persons present on the virtual encounter, with roles: [name] (patient), Aliene Altes, PA-C (provider)   Total time (minutes) spent on medical discussion: 22 minutes  Virtual Visit via Telephone Note Due to COVID-19, visit is conducted virtually and was requested by patient.   I connected with [name] on 08/14/21 at 10:30 AM EST by telephone and verified that I am speaking with the correct person using two identifiers.   I discussed the limitations, risks, security and privacy concerns of performing an evaluation and management service by telephone and the availability of in person appointments. I also discussed with the patient that there may be a patient responsible charge related to this service. The patient expressed understanding and agreed to proceed.  Chief Complaint  Patient presents with   Constipation    "Little bit"   Gastroesophageal Reflux    Doing ok     History of Present Illness: [name] is a 76 y.o. female presenting today for follow-up of GERD, dysphagia, constipation.  She has history of chronic constipation (likely opioid induced), adenomatous colon polyps with last colonoscopy in January 2020, no recommendations to repeat due to age, GERD, esophageal granular cell tumor s/p EMR in 2011 by Dr. Newman Pies with [facility]. Has also seen Dr. Newman Pies for uncontrolled GERD in 2014 s/p EGDs, normal GES, and a variety of PPIs. Manometry had been attempted but she was unable to tolerate the probe.  EGD in April 2021 with Dr. Gala Romney due to uncontrolled GERD/odynophagia revealing nonobstructing Schatzki's ring, small hiatal hernia, abnormal gastric mucosa (chronic inactive gastritis, no H. pylori on biopsy), normal examined duodenum.  Suspected daily Goody powders likely  explain refractory upper GI symptoms. MBSS January 2022 due to reports of "foods going down the wrong way"- findings included mild oral phase dysphagia,  diffuse esophageal dysmotility. Patient's symptoms of "choking" not replicated during study.   Last seen in our office August 2022. Felt dysphagia was worsening, unable to get foods to the back of her throat to swallow. Items pass down her esophagus without trouble. GERD well controlled on rabeprazole 20 mg BID, but she ran out and was having symptoms. Bms every couple of weeks using Trulance PRN as daily use was too strong. Had not tried MiraLAX. No alarm symptoms. Planned to resume Aciphex BID, start MiraLAX 17 g daily, benefiber daily, continue trulance PRN, refer to SLP for evaluation of oropharyngeal dysphagia. Requested 4 week progress report on constipation. Follow-up in 6 months.   No progress report received.   MBSS September 2022 with mild oropharyngeal dysphagia, similar to prior study in January 2020, though may have slight worsening. Prolonged oral prep, slightly decreased bolus cohesion, piecemeal swallowing and mild vallecula and piriform residual that was cleared by reflexive repeat swallow.  No penetration or aspiration.  Barium tablet was swallowed and passed into the stomach without incident.  Recommended continuing regular diet with thin liquids, meds whole in applesauce or yogurt, alternate bites and sips, multiple dry swallows, slow consumption rate.   Today:  Constipation:  Dr. Merlene Laughter started her on Movantik 25 mg daily about 3 months ago.  She feels this is helping.  Having about 1 BM per week compared to 1 BM every couple of weeks.  She is no longer taking Trulance. She still has  not tried MiraLAX.  Reports she has some, but has not taken any.  Denies BRBPR, melena, or abdominal pain.  GERD:  Well-controlled with Aciphex 20 mg twice daily.  Also taking Pepcid twice daily.  Did not realize this was only to be used as needed.   Denies any breakthrough symptoms, N/V.  Dysphagia symptoms are worsening. Still feels like she can't swallow foods. Difficulty getting items to the back of her throat to swallow. Doesn't think she has any trouble once she swallows. No sensation of foods getting stuck in her esophagus. No regurgitation. Stopped eating meats about 2 months ago. Can't eat pizza or burgers. States she can't swallow them. Currently eating eggs and bacon for breakfast and turnip greens for lunch. Doesn't eat dinner. Snacks on cookies, Cheetos, potato chips during the day. Admits to unintentional weight loss. She weighed herself while we were on the phone, 176 lbs. She weighed 201 lbs when I saw her 6 months ago. Admits to early satiety which is new x 3-4 months. No new medications. Still taking Goody powders daily for knee pain.    Rybelsus for over a year for prediabetes. No new medications other than Movantik.   Past Medical History:  Diagnosis Date   Adenomatous colon polyp 2006   excised in 2006 & 2010Due surveillance 06/2013   Anxiety    Anxiety and depression    Asthma    Breast mass    right nipple bengn mass per patient   Breast tumor    Chest discomfort    Chronic back pain    Chronic constipation    Complication of anesthesia    Depression    Diverticulosis    Fibromyalgia    Gastroesophageal reflux disease    Hiatal hernia    History of benign esophageal tumor 2010   granular cell esophageal tumor (Dx 06/2008), resected via EMR 2011, due repeat EGD 02/2012   Hyperlipidemia    Hypertension    Insomnia    Migraines    PONV (postoperative nausea and vomiting)    Psychosis ([place])    Sleep apnea    Stop Bang score of 5. Pt said she was told by Dr. Merlene Laughter that she had "a little bit" of sleep apena, but not bad enough to treat.   Thyroid nodule    Tumor of esophagus      Past Surgical History:  Procedure Laterality Date   ABDOMINAL HYSTERECTOMY     BIOPSY  10/14/2019   Procedure: BIOPSY;   Surgeon: Daneil Dolin, MD;  Location: [facility];  Service: Endoscopy;;   BRAVO [place] STUDY  12/11/2011   Procedure: BRAVO [place];  Surgeon: Daneil Dolin, MD;  Location: [facility];  Service: Endoscopy;  Laterality: N/A;   BREAST EXCISIONAL BIOPSY  1990s, 2012   Left x2-sclerosing ductal papilloma-2012   CHOLECYSTECTOMY N/A 04/09/2013   Procedure: LAPAROSCOPIC CHOLECYSTECTOMY;  Surgeon: Jamesetta So, MD;  Location: [facility] ORS;  Service: General;  Laterality: N/A;   COLONOSCOPY  06/2008, 06/2011   sigmoid tics, tubular adenoma; 2013: anal canal hemorrhoids   COLONOSCOPY  07/10/2011   Anal canal hemorrhoids likely the cause of hematochezia in the setting of constipation; otherwise normal rectum ;submucosal  petechiae in left colon of doubtful clinical significance; otherwise, normal colon   COLONOSCOPY WITH PROPOFOL N/A 11/10/2017   cancelled in pre-op   COLONOSCOPY WITH PROPOFOL N/A 07/23/2018   Procedure: COLONOSCOPY WITH PROPOFOL;  Surgeon: Daneil Dolin, MD; 1 tubular adenoma, diverticulosis  in the sigmoid and descending colon, nonbleeding internal hemorrhoids.  No recommendations to repeat due to age.   ESOPHAGEAL DILATION N/A 05/08/2015   Procedure: ESOPHAGEAL DILATION;  Surgeon: Daneil Dolin, MD;  Location: [facility] OR[facility];  Service: Endoscopy;  Laterality: N/AVenia Minks 54/56   ESOPHAGOGASTRODUODENOSCOPY  12/11/2011   FTD:[id number] Schatzki's ring; otherwise normal/Small hiatal hernia. Antral and bulbar erosions   ESOPHAGOGASTRODUODENOSCOPY (EGD) WITH PROPOFOL N/A 05/08/2015   Dr. Gala Romney: mild erosive reflux esophagitis, non-critical Schatzki's ring s/p dilation. Hiatal hernia.    ESOPHAGOGASTRODUODENOSCOPY (EGD) WITH PROPOFOL N/A 10/14/2019   Procedure: ESOPHAGOGASTRODUODENOSCOPY (EGD) WITH PROPOFOL;  Surgeon: Daneil Dolin, MD;  Nonobstructing Schatzki ring, small hiatal hernia, abnormal gastric mucosa s/p biopsied, normal first and second portion of the duodenum.  Pathology with chronic  inactive gastritis, no H. pylori.   EUS  08/2010   Dr [facility] with EGD. Retained food. No recurrent esophageal lesion, bx negative.   IR KYPHO THORACIC WITH BONE BIOPSY  10/17/2020   IR RADIOLOGIST EVAL & MGMT  09/12/2020   LUNG BIOPSY     negative   PARATHYROIDECTOMY  April 2017   [facility].    PARATHYROIDECTOMY     POLYPECTOMY  07/23/2018   Procedure: POLYPECTOMY;  Surgeon: Daneil Dolin, MD;  Location: [facility];  Service: Endoscopy;;  colon   RIGHT OOPHORECTOMY     benign disease   SHOULDER ARTHROSCOPY     Right; bone spurs removed   TONSILLECTOMY     TOTAL KNEE ARTHROPLASTY  2002   Right; previous arthroscopic surgery     Current Meds  Medication Sig   AIMOVIG 70 MG/ML SOAJ Inject 70 mg into the skin every 30 (thirty) days.   albuterol (PROVENTIL HFA;VENTOLIN HFA) 108 (90 BASE) MCG/ACT inhaler Inhale 2 puffs into the lungs every 6 (six) hours as needed for wheezing or shortness of breath.    amLODipine (NORVASC) 10 MG tablet Take 10 mg by mouth daily.   Aspirin-Acetaminophen-Caffeine (GOODY HEADACHE PO) Take 1 packet by mouth daily as needed (headaches/pain).   atenolol (TENORMIN) 100 MG tablet TAKE 1 TABLET BY MOUTH  DAILY   atenolol (TENORMIN) 25 MG tablet TAKE 1 TABLET BY MOUTH  DAILY IN ADDITION TO 100MG   TABLET DAILY FOR A TOTAL OF 125MG  DAILY   calcium carbonate (TUMS - DOSED IN MG ELEMENTAL CALCIUM) 500 MG chewable tablet Chew 1,500 mg by mouth daily as needed for indigestion or heartburn.   CALCIUM-VITAMIN D PO Take 1 tablet by mouth daily.   cetirizine (ZYRTEC) 10 MG tablet Take 10 mg by mouth daily as needed for allergies.   chlorthalidone (HYGROTON) 25 MG tablet Take 1/2 tablet by mouth daily.   cycloSPORINE (RESTASIS) 0.05 % ophthalmic emulsion Place 1 drop into both eyes 2 (two) times daily as needed (dry eyes).   famotidine (PEPCID) 20 MG tablet Take 1 tablet (20 mg total) by mouth as needed for heartburn or indigestion.   ferrous sulfate 325 (65 FE) MG tablet  Take 325 mg by mouth daily with breakfast. Every other day   fluticasone (FLONASE) 50 MCG/ACT nasal spray Place 1 spray into both nostrils daily as needed for allergies.    furosemide (LASIX) 80 MG tablet Take 1 tablet (80 mg total) by mouth every morning AND 0.5 tablets (40 mg total) every evening.   gabapentin (NEURONTIN) 100 MG capsule Take 100 mg by mouth 3 (three) times daily.   HYDROcodone-acetaminophen (NORCO/VICODIN) 5-325 MG tablet Take 1 tablet by mouth 2 (two) times daily  as needed for moderate pain.   INGREZZA 80 MG CAPS Take 80 mg by mouth daily.   lovastatin (MEVACOR) 40 MG tablet Take 40 mg by mouth at bedtime.   Melatonin 3 MG TABS Take 9 mg by mouth at bedtime.    memantine (NAMENDA) 10 MG tablet Take 10 mg by mouth 2 (two) times daily.    naloxegol oxalate (MOVANTIK) 25 MG TABS tablet Take by mouth daily.   neomycin-bacitracin-polymyxin (NEOSPORIN) [phone] ointment Apply 1 application topically as needed (wound care).   OLANZapine (ZYPREXA) 7.5 MG tablet Take 7.5 mg by mouth at bedtime.   potassium chloride SA (KLOR-CON) 20 MEQ tablet Take 2 tablets (40 meq) by mouth in the morning and take 1 tablet (20 meq) by mouth in the evening   RABEprazole (ACIPHEX) 20 MG tablet Take 1 tablet (20 mg total) by mouth 2 (two) times daily.   Semaglutide (RYBELSUS) 7 MG TABS Take 7 mg by mouth daily.   traZODone (DESYREL) 100 MG tablet Take 100 mg by mouth at bedtime.   venlafaxine XR (EFFEXOR-XR) 150 MG 24 hr capsule Take 150 mg by mouth at bedtime. Take with 75 mg to equal 225 mg at bedtime   venlafaxine XR (EFFEXOR-XR) 75 MG 24 hr capsule Take 75 mg by mouth at bedtime. Take with 150 mg to equal 225 mg at bedtime   zolpidem (AMBIEN) 10 MG tablet Take 10 mg by mouth at bedtime.   [DISCONTINUED] TRULANCE 3 MG TABS TAKE 1 TABLET BY MOUTH  DAILY     Family History  Problem Relation Age of Onset   Stroke Father    Alcohol abuse Father    Heart attack Mother    Depression Mother     Anxiety disorder Mother    Colon cancer Paternal Aunt    Colon cancer Paternal Uncle    Dementia Maternal Uncle    ADD / ADHD Neg Hx    Bipolar disorder Neg Hx    Drug abuse Neg Hx    OCD Neg Hx    Paranoid behavior Neg Hx    Schizophrenia Neg Hx    Seizures Neg Hx    Sexual abuse Neg Hx    Physical abuse Neg Hx     Social History   Socioeconomic History   Marital status: Single    Spouse name: Not on file   Number of children: 1   Years of education: Not on file   Highest education level: Not on file  Occupational History   Occupation: disabled    Employer: RETIRED  Tobacco Use   Smoking status: Never   Smokeless tobacco: Never  Vaping Use   Vaping Use: Never used  Substance and Sexual Activity   Alcohol use: No    Alcohol/week: 0.0 standard drinks   Drug use: No   Sexual activity: Never  Other Topics Concern   Not on file  Social History Narrative   Not on file   Social Determinants of Health   Financial Resource Strain: Not on file  Food Insecurity: Not on file  Transportation Needs: Not on file  Physical Activity: Not on file  Stress: Not on file  Social Connections: Not on file       Review of Systems: Gen: Denies fever, chills, anorexia. Denies fatigue, weakness, weight loss.  CV: Denies chest pain, palpitations, syncope. Resp: Denies dyspnea or cough. GI: see HPI Heme: See HPI  Observations/Objective: No distress. Alert and oriented. Pleasant. Unable to perform complete physical exam  due to telephone encounter. No video available.    Assessment: 76 year old female with history of chronic constipation, adenomatous colon polyps with colonoscopy up-to-date, GERD, esophageal granular cell tumor s/p EMR in 2011 by Dr. Newman Pies with [facility], history of Schatzki's ring s/p dilation in April 2021, and history of oropharyngeal dysphagia, presenting today for follow-up.  Constipation: Likely OIC.  Improving with Movantik 25 mg daily started about 3 months  ago, but symptoms still not adequately managed. Denies brbpr or abdominal pain. BMs about once a week compared to once every couple of weeks.  She has failed several other medications including Trulance which was too strong, Linzess also too strong at all doses, Amitiza 24 mcg twice daily not strong enough.  I recommended continuing Movantik and adding MiraLAX daily.  Advised to call back if the addition of MiraLAX is not helpful.  GERD: Chronic.  Typical GERD symptoms are well controlled on Aciphex 20 mg twice daily and Pepcid 20 mg twice daily.  Patient reports she did not realize Pepcid was to only be used as needed.  Encouraged continuing her current medications, but using Pepcid as needed rather than scheduled.  Weight loss: Patient weighed herself while I was on the phone with her today and reported a weight of 176 lbs.  She weighed 201 lbs when I saw her in the office 6 months ago. Documented weight on 07/20/21 was 184 lbs. Weight loss has been unintentional.  I suspect oropharyngeal dysphagia is likely a large contributor to her weight loss as her diet has been restricted due to inability to swallow certain foods, and she reports completely eliminating meats 2 months ago.  However, she also reports new onset early satiety x3-4 months without associated abdominal pain, nausea, vomiting.  Denies any new medications in the last 6 months aside from [facility]. She is taking Rybelsus for prediabetes which could contribute to early satiety, but she has been on this for well over a year.  Notably, she has also continued taking Goody powders daily despite previously recommending against this multiple times.   Early satiety and weight loss need further evaluation. I have recommended starting with an EGD to evaluate for gastritis, H. pylori, PUD, gastric outlet obstruction, malignancy. Further recommendations to follow. Also discussed dietary changes to help with dysphagia and weight maintenance.    Dysphagia: Ongoing dysphagia with what sounds to be primarily oropharyngeal symptoms.  Patient is somewhat of a difficult historian, but she continues to report her primary problem is getting foods to the back of her throat to swallow.  She does not feel foods are getting stuck in her esophagus and denies regurgitation of foods after swallowing.  She does have a history of a Schatzki's ring on her last EGD in April 2021 that was dilated.  Since that time, she has had 2 modified barium swallows, most recently in September 2022 which redemonstrated oropharyngeal dysphagia.  She swallowed a barium tablet at that time as well which passed into the stomach without incident.  Speech therapy recommended conservative measures.  She reports some worsening of her dysphagia symptoms since her most recent modified barium swallow.  She is still able to eat several foods, but has a lot of trouble with meats and eliminated them completely 2 months ago.  Also reports she cannot eat pizza or burgers.  I reinforced speech therapy's prior recommendations, encouraged her to stick to a soft, moist diet.  As we are planning for EGD to evaluate weight loss as per above, we will  reevaluate for esophagus to ensure there are no structural abnormalities that may be contributing to her dysphagia symptoms as well though I feel this is less likely.   Plan: Proceed with EGD +/- dilation with propofol with Dr. Abbey Chatters in the near future. The risks, benefits, and alternatives have been discussed with the patient in detail. The patient states understanding and desires to proceed. ASA 3 No morning diabetes medications Continue Movantik 25 mg daily. Start MiraLAX 1 capful (17 g) daily in 8 ounces of water. Drink at least 64 ounces of water daily. Continue Aciphex 20 mg twice daily. Use Pepcid 20 mg as needed. Reinforced GERD diet/lifestyle.  Separate written instructions provided. Discontinue Goody powders and avoid all NSAIDs. Eat  at least 3 meals daily. Add 2 protein shakes daily. Follow a soft, moist diet, eat slowly and take small bites, swallow multiple times to ensure food passes into the esophagus well, alternate bites of food with sips of liquids. Follow-up in 8 weeks.  I discussed the assessment and treatment plan with the patient. The patient was provided an opportunity to ask questions and all were answered. The patient agreed with the plan and demonstrated an understanding of the instructions.   The patient was advised to call back or seek an in-person evaluation if the symptoms worsen or if the condition fails to improve as anticipated.  I provided 22 minutes of non-face-to-face time during this encounter.  Aliene Altes, PA-C [facility]

## 2021-08-14 ENCOUNTER — Telehealth (INDEPENDENT_AMBULATORY_CARE_PROVIDER_SITE_OTHER): Payer: Medicare Other | Admitting: Gastroenterology

## 2021-08-14 ENCOUNTER — Telehealth: Payer: Self-pay | Admitting: Gastroenterology

## 2021-08-14 ENCOUNTER — Encounter: Payer: Self-pay | Admitting: Gastroenterology

## 2021-08-14 ENCOUNTER — Telehealth: Payer: Self-pay | Admitting: *Deleted

## 2021-08-14 ENCOUNTER — Other Ambulatory Visit: Payer: Self-pay

## 2021-08-14 DIAGNOSIS — R1312 Dysphagia, oropharyngeal phase: Secondary | ICD-10-CM | POA: Diagnosis not present

## 2021-08-14 DIAGNOSIS — K219 Gastro-esophageal reflux disease without esophagitis: Secondary | ICD-10-CM | POA: Diagnosis not present

## 2021-08-14 DIAGNOSIS — K5909 Other constipation: Secondary | ICD-10-CM

## 2021-08-14 DIAGNOSIS — R634 Abnormal weight loss: Secondary | ICD-10-CM | POA: Diagnosis not present

## 2021-08-14 DIAGNOSIS — R6881 Early satiety: Secondary | ICD-10-CM | POA: Insufficient documentation

## 2021-08-14 NOTE — Telephone Encounter (Signed)
Pt consented to a virtual visit. 

## 2021-08-14 NOTE — Telephone Encounter (Signed)
Called pt. Scheduled for procedure 3/2 at 10:45am. Aware will call back with pre-op appt. Instructions mailed and discussed.   PA approved via [facility]. Auth# [id number], DOS: Aug 23, 2021 - Nov 21, 2021

## 2021-08-14 NOTE — Patient Instructions (Addendum)
We will arrange for you to have an upper endoscopy with possible stretching of your esophagus in the near future with Dr. Abbey Chatters. Do not take Rybelsus the morning of your procedure.  Please discontinue Goody powders and avoid all NSAID products including ibuprofen, Aleve, Advil, naproxen, BC powders, and anything that says "NSAID" on the package.  For dysphagia (trouble swallowing):  Follow a soft diet.   Avoid tough textures and crunchy/dry foods. Eat slowly and take small bites. Swallow multiple times to ensure that your food passes into your esophagus. Alternate bites of food with sips of liquids.  For GERD: Continue Aciphex 20 mg twice daily 30 minutes before breakfast and dinner. Use Pepcid or Tums as needed for breakthrough symptoms.  Follow a GERD diet:  Avoid fried, fatty, greasy, spicy, citrus foods. Avoid caffeine and carbonated beverages. Avoid chocolate. Try eating 4-6 small meals a day rather than 3 large meals. Do not eat within 3 hours of laying down. Prop head of bed up on wood or bricks to create a 6 inch incline.  For constipation: Continue Movantik 25 mg daily. Start MiraLAX 1 capful (17 g) daily in 8 ounces of water. Drink at least 64 ounces of water daily.  For weight loss: Eat at least 3 meals daily. Add 2 protein shakes daily.  You can purchase Ensure, boost, fair life protein, or other protein shakes.  You could also purchase protein powder and create your own protein shakes at home.  We will follow-up with you in the office after your upper endoscopy.  Do not hesitate to call if you have any questions or concerns prior to your next visit.  It was a pleasure talking with you again today!   Aliene Altes, PA-C [facility]

## 2021-08-14 NOTE — Telephone Encounter (Signed)
[facility] clinical pool: Please arrange EGD +/- dilation with propofol with Dr. Abbey Chatters. Dx: Early satiety, unintentional weight loss, dysphagia ASA 3 Hold Rybelsus morning of procedure (patient is taking for diabetes/prediabetes).   Stacey:  Please arrange 8-week follow-up.  Dx: Unintentional weight loss, early satiety, constipation

## 2021-08-14 NOTE — Telephone Encounter (Signed)
[name], you are scheduled for a virtual visit with your provider today.  Just as we do with appointments in the office, we must obtain your consent to participate.  Your consent will be active for this visit and any virtual visit you may have with one of our providers in the next 365 days.  If you have a MyChart account, I can also send a copy of this consent to you electronically.  All virtual visits are billed to your insurance company just like a traditional visit in the office.  As this is a virtual visit, video technology does not allow for your provider to perform a traditional examination.  This may limit your provider's ability to fully assess your condition.  If your provider identifies any concerns that need to be evaluated in person or the need to arrange testing such as labs, EKG, etc, we will make arrangements to do so.  Although advances in technology are sophisticated, we cannot ensure that it will always work on either your end or our end.  If the connection with a video visit is poor, we may have to switch to a telephone visit.  With either a video or telephone visit, we are not always able to ensure that we have a secure connection.   I need to obtain your verbal consent now.   Are you willing to proceed with your visit today?

## 2021-08-15 NOTE — Telephone Encounter (Signed)
Called pt, LMOVM with pre-op appt details 

## 2021-08-17 NOTE — Patient Instructions (Signed)
[name]  08/17/2021     @PREFPERIOPPHARMACY @   Your procedure is scheduled on  08/23/2021.   Report to [name]   Call this number if you have problems the morning of surgery:  [phone]   Remember:  Follow the diet instructions given to you by the office.    Use your inhaler before you come and bring your rescu inhaler with you.    Take these medicines the morning of surgery with A SIP OF WATER            amlodipine, atenolol, pepcid, gabapentin, hydrocodone(if needed), namenda, aciphex.     Do not wear jewelry, make-up or nail polish.  Do not wear lotions, powders, or perfumes, or deodorant.  Do not shave 48 hours prior to surgery.  Men may shave face and neck.  Do not bring valuables to the hospital.  [facility] is not responsible for any belongings or valuables.  Contacts, dentures or bridgework may not be worn into surgery.  Leave your suitcase in the car.  After surgery it may be brought to your room.  For patients admitted to the hospital, discharge time will be determined by your treatment team.  Patients discharged the day of surgery will not be allowed to drive home and must have someone with them for 24 hours.    Special instructions:   DO NOT smoke tobacco or vape for 24 hours before your procedure.  Please read over the following fact sheets that you were given. Anesthesia Post-op Instructions and Care and Recovery After Surgery      Upper Endoscopy, Adult, Care After This sheet gives you information about how to care for yourself after your procedure. Your health care provider may also give you more specific instructions. If you have problems or questions, contact your health care provider. What can I expect after the procedure? After the procedure, it is common to have: A sore throat. Mild stomach pain or discomfort. Bloating. Nausea. Follow these instructions at home:  Follow instructions from your health  care provider about what to eat or drink after your procedure. Return to your normal activities as told by your health care provider. Ask your health care provider what activities are safe for you. Take over-the-counter and prescription medicines only as told by your health care provider. If you were given a sedative during the procedure, it can affect you for several hours. Do not drive or operate machinery until your health care provider says that it is safe. Keep all follow-up visits as told by your health care provider. This is important. Contact a health care provider if you have: A sore throat that lasts longer than one day. Trouble swallowing. Get help right away if: You vomit blood or your vomit looks like coffee grounds. You have: A fever. Bloody, black, or tarry stools. A severe sore throat or you cannot swallow. Difficulty breathing. Severe pain in your chest or abdomen. Summary After the procedure, it is common to have a sore throat, mild stomach discomfort, bloating, and nausea. If you were given a sedative during the procedure, it can affect you for several hours. Do not drive or operate machinery until your health care provider says that it is safe. Follow instructions from your health care provider about what to eat or drink after your procedure. Return to your normal activities as told by your health care provider. This information is not intended  to replace advice given to you by your health care provider. Make sure you discuss any questions you have with your health care provider. Document Revised: 04/16/2019 Document Reviewed: 11/10/2017 Elsevier Patient Education  2022 [place]. Esophageal Dilatation Esophageal dilatation, also called esophageal dilation, is a procedure to widen or open a blocked or narrowed part of the esophagus. The esophagus is the part of the body that moves food and liquid from the mouth to the stomach. You may need this procedure if: You have  a buildup of scar tissue in your esophagus that makes it difficult, painful, or impossible to swallow. This can be caused by gastroesophageal reflux disease (GERD). You have cancer of the esophagus. There is a problem with how food moves through your esophagus. In some cases, you may need this procedure repeated at a later time to dilate the esophagus gradually. Tell a health care provider about: Any allergies you have. All medicines you are taking, including vitamins, herbs, eye drops, creams, and over-the-counter medicines. Any problems you or family members have had with anesthetic medicines. Any blood disorders you have. Any surgeries you have had. Any medical conditions you have. Any antibiotic medicines you are required to take before dental procedures. Whether you are pregnant or may be pregnant. What are the risks? Generally, this is a safe procedure. However, problems may occur, including: Bleeding due to a tear in the lining of the esophagus. A hole, or perforation, in the esophagus. What happens before the procedure? Ask your health care provider about: Changing or stopping your regular medicines. This is especially important if you are taking diabetes medicines or blood thinners. Taking medicines such as aspirin and ibuprofen. These medicines can thin your blood. Do not take these medicines unless your health care provider tells you to take them. Taking over-the-counter medicines, vitamins, herbs, and supplements. Follow instructions from your health care provider about eating or drinking restrictions. Plan to have a responsible adult take you home from the hospital or clinic. Plan to have a responsible adult care for you for the time you are told after you leave the hospital or clinic. This is important. What happens during the procedure? You may be given a medicine to help you relax (sedative). A numbing medicine may be sprayed into the back of your throat, or you may gargle  the medicine. Your health care provider may perform the dilatation using various surgical instruments, such as: Simple dilators. This instrument is carefully placed in the esophagus to stretch it. Guided wire bougies. This involves using an endoscope to insert a wire into the esophagus. A dilator is passed over this wire to enlarge the esophagus. Then the wire is removed. Balloon dilators. An endoscope with a small balloon is inserted into the esophagus. The balloon is inflated to stretch the esophagus and open it up. The procedure may vary among health care providers and hospitals. What can I expect after the procedure? Your blood pressure, heart rate, breathing rate, and blood oxygen level will be monitored until you leave the hospital or clinic. Your throat may feel slightly sore and numb. This will get better over time. You will not be allowed to eat or drink until your throat is no longer numb. When you are able to drink, urinate, and sit on the edge of the bed without nausea or dizziness, you may be able to return home. Follow these instructions at home: Take over-the-counter and prescription medicines only as told by your health care provider. If you were  given a sedative during the procedure, it can affect you for several hours. Do not drive or operate machinery until your health care provider says that it is safe. Plan to have a responsible adult care for you for the time you are told. This is important. Follow instructions from your health care provider about any eating or drinking restrictions. Do not use any products that contain nicotine or tobacco, such as cigarettes, e-cigarettes, and chewing tobacco. If you need help quitting, ask your health care provider. Keep all follow-up visits. This is important. Contact a health care provider if: You have a fever. You have pain that is not relieved by medicine. Get help right away if: You have chest pain. You have trouble breathing. You  have trouble swallowing. You vomit blood. You have black, tarry, or bloody stools. These symptoms may represent a serious problem that is an emergency. Do not wait to see if the symptoms will go away. Get medical help right away. Call your local emergency services (911 in the [place] Do not drive yourself to the hospital. Summary Esophageal dilatation, also called esophageal dilation, is a procedure to widen or open a blocked or narrowed part of the esophagus. Plan to have a responsible adult take you home from the hospital or clinic. For this procedure, a numbing medicine may be sprayed into the back of your throat, or you may gargle the medicine. Do not drive or operate machinery until your health care provider says that it is safe. This information is not intended to replace advice given to you by your health care provider. Make sure you discuss any questions you have with your health care provider. Document Revised: 10/27/2019 Document Reviewed: 10/27/2019 Elsevier Patient Education  [place] After This sheet gives you information about how to care for yourself after your procedure. Your health care provider may also give you more specific instructions. If you have problems or questions, contact your health care provider. What can I expect after the procedure? After the procedure, it is common to have: Tiredness. Forgetfulness about what happened after the procedure. Impaired judgment for important decisions. Nausea or vomiting. Some difficulty with balance. Follow these instructions at home: For the time period you were told by your health care provider:   Rest as needed. Do not participate in activities where you could fall or become injured. Do not drive or use machinery. Do not drink alcohol. Do not take sleeping pills or medicines that cause drowsiness. Do not make important decisions or sign legal documents. Do not take care of  children on your own. Eating and drinking Follow the diet that is recommended by your health care provider. Drink enough fluid to keep your urine pale yellow. If you vomit: Drink water, juice, or soup when you can drink without vomiting. Make sure you have little or no nausea before eating solid foods. General instructions Have a responsible adult stay with you for the time you are told. It is important to have someone help care for you until you are awake and alert. Take over-the-counter and prescription medicines only as told by your health care provider. If you have sleep apnea, surgery and certain medicines can increase your risk for breathing problems. Follow instructions from your health care provider about wearing your sleep device: Anytime you are sleeping, including during daytime naps. While taking prescription pain medicines, sleeping medicines, or medicines that make you drowsy. Avoid smoking. Keep all follow-up visits as told by your  health care provider. This is important. Contact a health care provider if: You keep feeling nauseous or you keep vomiting. You feel light-headed. You are still sleepy or having trouble with balance after 24 hours. You develop a rash. You have a fever. You have redness or swelling around the IV site. Get help right away if: You have trouble breathing. You have new-onset confusion at home. Summary For several hours after your procedure, you may feel tired. You may also be forgetful and have poor judgment. Have a responsible adult stay with you for the time you are told. It is important to have someone help care for you until you are awake and alert. Rest as told. Do not drive or operate machinery. Do not drink alcohol or take sleeping pills. Get help right away if you have trouble breathing, or if you suddenly become confused. This information is not intended to replace advice given to you by your health care provider. Make sure you discuss any  questions you have with your health care provider. Document Revised: 02/24/2020 Document Reviewed: 05/13/2019 Elsevier Patient Education  2022 [employer].

## 2021-08-20 ENCOUNTER — Inpatient Hospital Stay (HOSPITAL_COMMUNITY)
Admission: EM | Admit: 2021-08-20 | Discharge: 2021-08-23 | DRG: 641 | Disposition: A | Discharge status: Home / Self Care | Payer: Medicare Other | Attending: Internal Medicine | Admitting: Internal Medicine

## 2021-08-20 ENCOUNTER — Encounter (HOSPITAL_COMMUNITY): Payer: Self-pay

## 2021-08-20 ENCOUNTER — Encounter (HOSPITAL_COMMUNITY)
Admission: RE | Admit: 2021-08-20 | Discharge: 2021-08-20 | Disposition: A | Discharge status: Home / Self Care | Payer: Medicare Other | Source: Ambulatory Visit | Attending: Internal Medicine | Admitting: Internal Medicine

## 2021-08-20 ENCOUNTER — Other Ambulatory Visit: Payer: Self-pay

## 2021-08-20 ENCOUNTER — Telehealth: Payer: Self-pay | Admitting: Internal Medicine

## 2021-08-20 VITALS — BP 126/81 | HR 71 | Temp 97.8°F | Resp 18 | Ht 62.0 in | Wt 179.0 lb

## 2021-08-20 DIAGNOSIS — Z79899 Other long term (current) drug therapy: Secondary | ICD-10-CM

## 2021-08-20 DIAGNOSIS — Z8249 Family history of ischemic heart disease and other diseases of the circulatory system: Secondary | ICD-10-CM

## 2021-08-20 DIAGNOSIS — R1310 Dysphagia, unspecified: Secondary | ICD-10-CM

## 2021-08-20 DIAGNOSIS — Z91040 Latex allergy status: Secondary | ICD-10-CM | POA: Diagnosis not present

## 2021-08-20 DIAGNOSIS — E11649 Type 2 diabetes mellitus with hypoglycemia without coma: Secondary | ICD-10-CM | POA: Diagnosis present

## 2021-08-20 DIAGNOSIS — Z90721 Acquired absence of ovaries, unilateral: Secondary | ICD-10-CM

## 2021-08-20 DIAGNOSIS — E785 Hyperlipidemia, unspecified: Secondary | ICD-10-CM | POA: Diagnosis not present

## 2021-08-20 DIAGNOSIS — Z800 Family history of malignant neoplasm of digestive organs: Secondary | ICD-10-CM

## 2021-08-20 DIAGNOSIS — K449 Diaphragmatic hernia without obstruction or gangrene: Secondary | ICD-10-CM | POA: Diagnosis present

## 2021-08-20 DIAGNOSIS — E876 Hypokalemia: Secondary | ICD-10-CM | POA: Diagnosis not present

## 2021-08-20 DIAGNOSIS — M797 Fibromyalgia: Secondary | ICD-10-CM | POA: Diagnosis present

## 2021-08-20 DIAGNOSIS — G4700 Insomnia, unspecified: Secondary | ICD-10-CM | POA: Diagnosis not present

## 2021-08-20 DIAGNOSIS — Z880 Allergy status to penicillin: Secondary | ICD-10-CM | POA: Diagnosis not present

## 2021-08-20 DIAGNOSIS — E669 Obesity, unspecified: Secondary | ICD-10-CM | POA: Diagnosis present

## 2021-08-20 DIAGNOSIS — Z90710 Acquired absence of both cervix and uterus: Secondary | ICD-10-CM | POA: Diagnosis not present

## 2021-08-20 DIAGNOSIS — K5909 Other constipation: Secondary | ICD-10-CM | POA: Diagnosis present

## 2021-08-20 DIAGNOSIS — I130 Hypertensive heart and chronic kidney disease with heart failure and stage 1 through stage 4 chronic kidney disease, or unspecified chronic kidney disease: Secondary | ICD-10-CM | POA: Diagnosis not present

## 2021-08-20 DIAGNOSIS — Z6832 Body mass index (BMI) 32.0-32.9, adult: Secondary | ICD-10-CM | POA: Diagnosis not present

## 2021-08-20 DIAGNOSIS — R634 Abnormal weight loss: Secondary | ICD-10-CM | POA: Diagnosis present

## 2021-08-20 DIAGNOSIS — Z888 Allergy status to other drugs, medicaments and biological substances status: Secondary | ICD-10-CM

## 2021-08-20 DIAGNOSIS — N1831 Chronic kidney disease, stage 3a: Secondary | ICD-10-CM | POA: Diagnosis not present

## 2021-08-20 DIAGNOSIS — N183 Chronic kidney disease, stage 3 unspecified: Secondary | ICD-10-CM | POA: Insufficient documentation

## 2021-08-20 DIAGNOSIS — I10 Essential (primary) hypertension: Secondary | ICD-10-CM | POA: Diagnosis present

## 2021-08-20 DIAGNOSIS — E1100 Type 2 diabetes mellitus with hyperosmolarity without nonketotic hyperglycemic-hyperosmolar coma (NKHHC): Secondary | ICD-10-CM | POA: Diagnosis not present

## 2021-08-20 DIAGNOSIS — Z01812 Encounter for preprocedural laboratory examination: Secondary | ICD-10-CM | POA: Insufficient documentation

## 2021-08-20 DIAGNOSIS — Z881 Allergy status to other antibiotic agents status: Secondary | ICD-10-CM | POA: Diagnosis not present

## 2021-08-20 DIAGNOSIS — Z9049 Acquired absence of other specified parts of digestive tract: Secondary | ICD-10-CM | POA: Diagnosis not present

## 2021-08-20 DIAGNOSIS — Z86010 Personal history of colonic polyps: Secondary | ICD-10-CM | POA: Diagnosis not present

## 2021-08-20 DIAGNOSIS — Z811 Family history of alcohol abuse and dependence: Secondary | ICD-10-CM

## 2021-08-20 DIAGNOSIS — E1165 Type 2 diabetes mellitus with hyperglycemia: Secondary | ICD-10-CM | POA: Diagnosis not present

## 2021-08-20 DIAGNOSIS — Z885 Allergy status to narcotic agent status: Secondary | ICD-10-CM

## 2021-08-20 DIAGNOSIS — K2289 Other specified disease of esophagus: Secondary | ICD-10-CM | POA: Diagnosis not present

## 2021-08-20 DIAGNOSIS — E782 Mixed hyperlipidemia: Secondary | ICD-10-CM | POA: Diagnosis not present

## 2021-08-20 DIAGNOSIS — K222 Esophageal obstruction: Secondary | ICD-10-CM | POA: Diagnosis not present

## 2021-08-20 DIAGNOSIS — F419 Anxiety disorder, unspecified: Secondary | ICD-10-CM | POA: Diagnosis present

## 2021-08-20 DIAGNOSIS — Z20822 Contact with and (suspected) exposure to covid-19: Secondary | ICD-10-CM | POA: Diagnosis not present

## 2021-08-20 DIAGNOSIS — N179 Acute kidney failure, unspecified: Secondary | ICD-10-CM | POA: Diagnosis present

## 2021-08-20 DIAGNOSIS — G8929 Other chronic pain: Secondary | ICD-10-CM | POA: Diagnosis present

## 2021-08-20 DIAGNOSIS — Z96651 Presence of right artificial knee joint: Secondary | ICD-10-CM | POA: Diagnosis present

## 2021-08-20 DIAGNOSIS — I5032 Chronic diastolic (congestive) heart failure: Secondary | ICD-10-CM | POA: Diagnosis not present

## 2021-08-20 DIAGNOSIS — K219 Gastro-esophageal reflux disease without esophagitis: Secondary | ICD-10-CM

## 2021-08-20 DIAGNOSIS — D649 Anemia, unspecified: Secondary | ICD-10-CM

## 2021-08-20 DIAGNOSIS — R9431 Abnormal electrocardiogram [ECG] [EKG]: Secondary | ICD-10-CM | POA: Diagnosis present

## 2021-08-20 DIAGNOSIS — K2100 Gastro-esophageal reflux disease with esophagitis, without bleeding: Secondary | ICD-10-CM | POA: Diagnosis not present

## 2021-08-20 DIAGNOSIS — Z79890 Hormone replacement therapy (postmenopausal): Secondary | ICD-10-CM

## 2021-08-20 DIAGNOSIS — Z823 Family history of stroke: Secondary | ICD-10-CM | POA: Diagnosis not present

## 2021-08-20 DIAGNOSIS — M549 Dorsalgia, unspecified: Secondary | ICD-10-CM | POA: Diagnosis present

## 2021-08-20 DIAGNOSIS — E119 Type 2 diabetes mellitus without complications: Secondary | ICD-10-CM

## 2021-08-20 DIAGNOSIS — Z818 Family history of other mental and behavioral disorders: Secondary | ICD-10-CM

## 2021-08-20 DIAGNOSIS — R1312 Dysphagia, oropharyngeal phase: Secondary | ICD-10-CM

## 2021-08-20 LAB — BASIC METABOLIC PANEL
Anion gap: 13 (ref 5–15)
BUN: 24 mg/dL — ABNORMAL HIGH (ref 8–23)
CO2: 31 mmol/L (ref 22–32)
Calcium: 9.7 mg/dL (ref 8.9–10.3)
Chloride: 93 mmol/L — ABNORMAL LOW (ref 98–111)
Creatinine, Ser: 1.37 mg/dL — ABNORMAL HIGH (ref 0.44–1.00)
GFR, Estimated: 40 mL/min — ABNORMAL LOW (ref >60)
Glucose, Bld: 119 mg/dL — ABNORMAL HIGH (ref 70–99)
Potassium: 2.2 mmol/L — CL (ref 3.5–5.1)
Sodium: 137 mmol/L (ref 135–145)

## 2021-08-20 LAB — COMPREHENSIVE METABOLIC PANEL
ALT: 22 U/L (ref 0–44)
AST: 25 U/L (ref 15–41)
Albumin: 4.6 g/dL (ref 3.5–5.0)
Alkaline Phosphatase: 81 U/L (ref 38–126)
Anion gap: 17 — ABNORMAL HIGH (ref 5–15)
BUN: 25 mg/dL — ABNORMAL HIGH (ref 8–23)
CO2: 28 mmol/L (ref 22–32)
Calcium: 10.2 mg/dL (ref 8.9–10.3)
Chloride: 90 mmol/L — ABNORMAL LOW (ref 98–111)
Creatinine, Ser: 1.48 mg/dL — ABNORMAL HIGH (ref 0.44–1.00)
GFR, Estimated: 37 mL/min — ABNORMAL LOW (ref >60)
Glucose, Bld: 102 mg/dL — ABNORMAL HIGH (ref 70–99)
Potassium: 2.1 mmol/L — CL (ref 3.5–5.1)
Sodium: 135 mmol/L (ref 135–145)
Total Bilirubin: 0.5 mg/dL (ref 0.3–1.2)
Total Protein: 7.9 g/dL (ref 6.5–8.1)

## 2021-08-20 LAB — CBC WITH DIFFERENTIAL/PLATELET
Abs Immature Granulocytes: 0.01 10*3/uL (ref 0.00–0.07)
Abs Immature Granulocytes: 0.02 10*3/uL (ref 0.00–0.07)
Basophils Absolute: 0 10*3/uL (ref 0.0–0.1)
Basophils Absolute: 0 10*3/uL (ref 0.0–0.1)
Basophils Relative: 1 %
Basophils Relative: 1 %
Eosinophils Absolute: 0.1 10*3/uL (ref 0.0–0.5)
Eosinophils Absolute: 0 10*3/uL (ref 0.0–0.5)
Eosinophils Relative: 1 %
Eosinophils Relative: 1 %
HCT: 49.9 % — ABNORMAL HIGH (ref 36.0–46.0)
HCT: 51.5 % — ABNORMAL HIGH (ref 36.0–46.0)
Hemoglobin: 15.9 g/dL — ABNORMAL HIGH (ref 12.0–15.0)
Hemoglobin: 16.5 g/dL — ABNORMAL HIGH (ref 12.0–15.0)
Immature Granulocytes: 0 %
Immature Granulocytes: 0 %
Lymphocytes Relative: 25 %
Lymphocytes Relative: 33 %
Lymphs Abs: 1.4 10*3/uL (ref 0.7–4.0)
Lymphs Abs: 2.5 10*3/uL (ref 0.7–4.0)
MCH: 27 pg (ref 26.0–34.0)
MCH: 27 pg (ref 26.0–34.0)
MCHC: 31.9 g/dL (ref 30.0–36.0)
MCHC: 32 g/dL (ref 30.0–36.0)
MCV: 84.9 fL (ref 80.0–100.0)
MCV: 84.3 fL (ref 80.0–100.0)
Monocytes Absolute: 0.4 10*3/uL (ref 0.1–1.0)
Monocytes Absolute: 0.8 10*3/uL (ref 0.1–1.0)
Monocytes Relative: 7 %
Monocytes Relative: 11 %
Neutro Abs: 3.6 10*3/uL (ref 1.7–7.7)
Neutro Abs: 4.2 10*3/uL (ref 1.7–7.7)
Neutrophils Relative %: 66 %
Neutrophils Relative %: 54 %
Platelets: 188 10*3/uL (ref 150–400)
Platelets: 229 10*3/uL (ref 150–400)
RBC: 5.88 MIL/uL — ABNORMAL HIGH (ref 3.87–5.11)
RBC: 6.11 MIL/uL — ABNORMAL HIGH (ref 3.87–5.11)
RDW: 14.6 % (ref 11.5–15.5)
RDW: 14.8 % (ref 11.5–15.5)
WBC: 5.5 10*3/uL (ref 4.0–10.5)
WBC: 7.6 10*3/uL (ref 4.0–10.5)
nRBC: 0 % (ref 0.0–0.2)
nRBC: 0 % (ref 0.0–0.2)

## 2021-08-20 LAB — RESP PANEL BY RT-PCR (FLU A&B, COVID) ARPGX2
Influenza A by PCR: NEGATIVE
Influenza B by PCR: NEGATIVE
SARS Coronavirus 2 by RT PCR: NEGATIVE

## 2021-08-20 LAB — MAGNESIUM: Magnesium: 2.2 mg/dL (ref 1.7–2.4)

## 2021-08-20 MED ORDER — POTASSIUM CHLORIDE 10 MEQ/100ML IV SOLN
10.0000 meq | INTRAVENOUS | Status: AC
  Administered 2021-08-20 (×3): 10 meq via INTRAVENOUS
  Filled 2021-08-20 (×3): qty 100

## 2021-08-20 MED ORDER — POTASSIUM CHLORIDE CRYS ER 20 MEQ PO TBCR
40.0000 meq | EXTENDED_RELEASE_TABLET | Freq: Once | ORAL | Status: AC
  Administered 2021-08-20: 40 meq via ORAL
  Filled 2021-08-20: qty 2

## 2021-08-20 MED ORDER — MAGNESIUM OXIDE -MG SUPPLEMENT 400 (240 MG) MG PO TABS
400.0000 mg | ORAL_TABLET | Freq: Once | ORAL | Status: AC
  Administered 2021-08-20: 400 mg via ORAL
  Filled 2021-08-20: qty 1

## 2021-08-20 MED ORDER — ENOXAPARIN SODIUM 40 MG/0.4ML IJ SOSY
40.0000 mg | PREFILLED_SYRINGE | INTRAMUSCULAR | Status: DC
  Administered 2021-08-20: 40 mg via SUBCUTANEOUS
  Filled 2021-08-20: qty 0.4

## 2021-08-20 NOTE — H&P (Signed)
History and Physical    Patient: [name] OZD:[id number] DOB: Sep 13, 1945 DOA: 08/20/2021 DOS: the patient was seen and examined on 08/21/2021 PCP: Celene Squibb, MD  Patient coming from: Home  Chief Complaint:  Chief Complaint  Patient presents with   Abnormal Labs    HPI: [name] is a 76 y.o. female with medical history significant of hypertension, hyperlipidemia, T2DM who presents to the emergency department after being referred to the ED by gastroenterologist.  Patient went for preprocedure check up with her gastroenterologist, work-up was done and she was noted to have low potassium (2.2), so she was asked to go to the ED for further evaluation and management. She complained of fatigue, but denies chest pain, shortness of breath, fever, chills, diarrhea, nausea or vomiting.  ED course: In the emergency department, she was hemodynamically stable, BP was 151/96.  Work-up in the ED showed elevated H/H 16.5/15.5, hypokalemia, BUN/creatinine 25/1.48 (baseline creatinine 0.9-1.0.  Magnesium 2.2.  Influenza A, B, SARS coronavirus 2 was negative.  Magnesium was given, potassium was replenished.  Hospitalist was asked to admit patient for further evaluation and management.  Review of Systems: As mentioned in the history of present illness. All other systems reviewed and are negative. Past Medical History:  Diagnosis Date   Adenomatous colon polyp 2006   excised in 2006 & 2010Due surveillance 06/2013   Anxiety    Anxiety and depression    Asthma    Breast mass    right nipple bengn mass per patient   Breast tumor    Chest discomfort    Chronic back pain    Chronic constipation    Complication of anesthesia    Depression    Diverticulosis    Fibromyalgia    Gastroesophageal reflux disease    Hiatal hernia    History of benign esophageal tumor 2010   granular cell esophageal tumor (Dx 06/2008), resected via EMR 2011, due repeat EGD 02/2012   Hyperlipidemia    Hypertension     Insomnia    Migraines    PONV (postoperative nausea and vomiting)    Psychosis ([place])    Sleep apnea    Stop Bang score of 5. Pt said she was told by Dr. Merlene Laughter that she had "a little bit" of sleep apena, but not bad enough to treat.   Thyroid nodule    Tumor of esophagus    Past Surgical History:  Procedure Laterality Date   ABDOMINAL HYSTERECTOMY     BIOPSY  10/14/2019   Procedure: BIOPSY;  Surgeon: Daneil Dolin, MD;  Location: [facility];  Service: Endoscopy;;   BRAVO [place] STUDY  12/11/2011   Procedure: BRAVO [place];  Surgeon: Daneil Dolin, MD;  Location: [facility];  Service: Endoscopy;  Laterality: N/A;   BREAST EXCISIONAL BIOPSY  1990s, 2012   Left x2-sclerosing ductal papilloma-2012   CHOLECYSTECTOMY N/A 04/09/2013   Procedure: LAPAROSCOPIC CHOLECYSTECTOMY;  Surgeon: Jamesetta So, MD;  Location: [facility];  Service: General;  Laterality: N/A;   COLONOSCOPY  06/2008, 06/2011   sigmoid tics, tubular adenoma; 2013: anal canal hemorrhoids   COLONOSCOPY  07/10/2011   Anal canal hemorrhoids likely the cause of hematochezia in the setting of constipation; otherwise normal rectum ;submucosal  petechiae in left colon of doubtful clinical significance; otherwise, normal colon   COLONOSCOPY WITH PROPOFOL N/A 11/10/2017   cancelled in pre-op   COLONOSCOPY WITH PROPOFOL N/A 07/23/2018   Procedure: COLONOSCOPY WITH PROPOFOL;  Surgeon: Gala Romney,  Cristopher Estimable, MD; 1 tubular adenoma, diverticulosis in the sigmoid and descending colon, nonbleeding internal hemorrhoids.  No recommendations to repeat due to age.   ESOPHAGEAL DILATION N/A 05/08/2015   Procedure: ESOPHAGEAL DILATION;  Surgeon: Daneil Dolin, MD;  Location: [facility];  Service: Endoscopy;  Laterality: N/AVenia Minks 54/56   ESOPHAGOGASTRODUODENOSCOPY  12/11/2011   UXN:[id number] Schatzki's ring; otherwise normal/Small hiatal hernia. Antral and bulbar erosions   ESOPHAGOGASTRODUODENOSCOPY (EGD) WITH PROPOFOL N/A 05/08/2015   Dr. Gala Romney:  mild erosive reflux esophagitis, non-critical Schatzki's ring s/p dilation. Hiatal hernia.    ESOPHAGOGASTRODUODENOSCOPY (EGD) WITH PROPOFOL N/A 10/14/2019   Procedure: ESOPHAGOGASTRODUODENOSCOPY (EGD) WITH PROPOFOL;  Surgeon: Daneil Dolin, MD;  Nonobstructing Schatzki ring, small hiatal hernia, abnormal gastric mucosa s/p biopsied, normal first and second portion of the duodenum.  Pathology with chronic inactive gastritis, no H. pylori.   EUS  08/2010   Dr [facility] with EGD. Retained food. No recurrent esophageal lesion, bx negative.   IR KYPHO THORACIC WITH BONE BIOPSY  10/17/2020   IR RADIOLOGIST EVAL & MGMT  09/12/2020   LUNG BIOPSY     negative   PARATHYROIDECTOMY  April 2017   [facility].    PARATHYROIDECTOMY     POLYPECTOMY  07/23/2018   Procedure: POLYPECTOMY;  Surgeon: Daneil Dolin, MD;  Location[facility];  Service: Endoscopy;;  colon   RIGHT OOPHORECTOMY     benign disease   SHOULDER ARTHROSCOPY     Right; bone spurs removed   TONSILLECTOMY     TOTAL KNEE ARTHROPLASTY  2002   Right; previous arthroscopic surgery   Social History:  reports that she has never smoked. She has never used smokeless tobacco. She reports that she does not drink alcohol and does not use drugs.  Allergies  Allergen Reactions   Elavil [Amitriptyline] Other (See Comments)    Felt really nervous and felt like something was hold her feet or arm and/ or AM headache on it and back on it and went away off it.    Abilify [Aripiprazole] Other (See Comments)    Dystonic reaction   Codeine Hives, Nausea Only and Other (See Comments)    Feels funny   Latex Hives   Penicillins Hives, Itching and Other (See Comments)    Has patient had a PCN reaction causing immediate rash, facial/tongue/throat swelling, SOB or lightheadedness with hypotension: Yes Has patient had a PCN reaction causing severe rash involving mucus membranes or skin necrosis: No Has patient had a PCN reaction that required hospitalization  No Has patient had a PCN reaction occurring within the last 10 years: No If all of the above answers are "NO", then may proceed with Cephalosporin use.    Polyethylene Glycol Other (See Comments)    Per allergy test   Sulfonamide Derivatives Nausea And Vomiting   Cymbalta [Duloxetine Hcl] Rash   Remeron [Mirtazapine] Other (See Comments)    Caused lots of strange, weird, crazy dreams.    Family History  Problem Relation Age of Onset   Stroke Father    Alcohol abuse Father    Heart attack Mother    Depression Mother    Anxiety disorder Mother    Colon cancer Paternal Aunt    Colon cancer Paternal Uncle    Dementia Maternal Uncle    ADD / ADHD Neg Hx    Bipolar disorder Neg Hx    Drug abuse Neg Hx    OCD Neg Hx    Paranoid behavior Neg Hx  Schizophrenia Neg Hx    Seizures Neg Hx    Sexual abuse Neg Hx    Physical abuse Neg Hx     Prior to Admission medications   Medication Sig Start Date End Date Taking? Authorizing Provider  AIMOVIG 70 MG/ML SOAJ Inject 70 mg into the skin every 30 (thirty) days. 05/23/20   [provider]  albuterol (PROVENTIL HFA;VENTOLIN HFA) 108 (90 BASE) MCG/ACT inhaler Inhale 2 puffs into the lungs every 6 (six) hours as needed for wheezing or shortness of breath.     [provider]  amLODipine (NORVASC) 10 MG tablet Take 10 mg by mouth daily. 12/18/15   [provider]  Aspirin-Acetaminophen-Caffeine (GOODY HEADACHE PO) Take 1 packet by mouth daily as needed (headaches/pain).    [provider]  atenolol (TENORMIN) 100 MG tablet TAKE 1 TABLET BY MOUTH  DAILY 01/25/21   Ahmed Prima, [place] M, PA-C  atenolol (TENORMIN) 25 MG tablet TAKE 1 TABLET BY MOUTH  DAILY IN ADDITION TO 100MG   TABLET DAILY FOR A TOTAL OF 125MG  DAILY 01/25/21   Ahmed Prima, Fransisco Hertz, PA-C  calcium carbonate (TUMS - DOSED IN MG ELEMENTAL CALCIUM) 500 MG chewable tablet Chew 1,500 mg by mouth daily as needed for indigestion or heartburn.    [provider]  CALCIUM-VITAMIN D PO Take 1 tablet by mouth daily.    [provider]  cetirizine (ZYRTEC) 10 MG tablet Take 10 mg by mouth daily as needed for allergies.    [provider]  chlorthalidone (HYGROTON) 25 MG tablet Take 1/2 tablet by mouth daily. 11/02/19   Arnoldo Lenis, MD  cycloSPORINE (RESTASIS) 0.05 % ophthalmic emulsion Place 1 drop into both eyes 2 (two) times daily as needed (dry eyes).    [provider]  famotidine (PEPCID) 20 MG tablet Take 1 tablet (20 mg total) by mouth as needed for heartburn or indigestion. 06/21/20   Erenest Rasher, PA-C  ferrous sulfate 325 (65 FE) MG tablet Take 325 mg by mouth daily with breakfast. Every other day    [provider]  fluticasone (FLONASE) 50 MCG/ACT nasal spray Place 1 spray into both nostrils daily as needed for allergies.     [provider]  furosemide (LASIX) 80 MG tablet Take 1 tablet (80 mg total) by mouth every morning AND 0.5 tablets (40 mg total) every evening. 07/20/21 10/18/21  Arnoldo Lenis, MD  gabapentin (NEURONTIN) 100 MG capsule Take 100 mg by mouth 3 (three) times daily.    [provider]  HYDROcodone-acetaminophen (NORCO/VICODIN) 5-325 MG tablet Take 1 tablet by mouth 2 (two) times daily as needed for moderate pain.    [provider]  INGREZZA 80 MG CAPS Take 80 mg by mouth daily. 04/28/20   [provider]  lovastatin (MEVACOR) 40 MG tablet Take 40 mg by mouth at bedtime.    [provider]  Melatonin 3 MG TABS Take 9 mg by mouth at bedtime.     [provider]  memantine (NAMENDA) 10 MG tablet Take 10 mg by mouth 2 (two) times daily.  10/20/17   [provider]  naloxegol oxalate (MOVANTIK) 25 MG TABS tablet Take by mouth daily.    [provider]  neomycin-bacitracin-polymyxin (NEOSPORIN) [phone] ointment Apply 1 application topically as needed (wound care).    [provider]   OLANZapine (ZYPREXA) 7.5 MG tablet Take 7.5 mg by mouth at bedtime.    [provider]  polyethylene glycol (MIRALAX /  GLYCOLAX) 17 g packet Take 17 g by mouth daily as needed. Patient not taking: Reported on 08/14/2021    [provider]  potassium chloride SA (KLOR-CON) 20 MEQ tablet Take 2 tablets (40 meq) by mouth in the morning and take 1 tablet (20 meq) by mouth in the evening 04/12/21   Ahmed Prima, [place] M, PA-C  RABEprazole (ACIPHEX) 20 MG tablet Take 1 tablet (20 mg total) by mouth 2 (two) times daily. 02/07/21   Erenest Rasher, PA-C  Semaglutide (RYBELSUS) 7 MG TABS Take 7 mg by mouth daily.    [provider]  traZODone (DESYREL) 100 MG tablet Take 100 mg by mouth at bedtime. 07/31/17   [provider]  venlafaxine XR (EFFEXOR-XR) 150 MG 24 hr capsule Take 150 mg by mouth at bedtime. Take with 75 mg to equal 225 mg at bedtime    [provider]  venlafaxine XR (EFFEXOR-XR) 75 MG 24 hr capsule Take 75 mg by mouth at bedtime. Take with 150 mg to equal 225 mg at bedtime    [provider]  zolpidem (AMBIEN) 10 MG tablet Take 10 mg by mouth at bedtime. 09/05/20   [provider]    Physical Exam: Vitals:   08/20/21 2115 08/20/21 2200 08/20/21 2215 08/21/21 0130  BP:  140/86  115/85  Pulse: 77 77 76 80  Resp:  (!) 8 12 13   Temp:      TempSrc:      SpO2: 99% 99% 100% 100%  Weight:      Height:       General: Elderly female. Awake and alert and oriented x3. Not in any acute distress.  HEENT: NCAT.  PERRLA. EOMI. Sclerae anicteric.  Moist mucosal membranes. Neck: Neck supple without lymphadenopathy. No carotid bruits. No masses palpated.  Cardiovascular: Regular rate with normal S1-S2 sounds. No murmurs, rubs or gallops auscultated. No JVD.  Respiratory: Clear breath sounds.  No accessory muscle use. Abdomen: Soft, nontender, nondistended. Active bowel sounds. No masses or hepatosplenomegaly  Skin: No rashes, lesions, or  ulcerations.  Dry, warm to touch. Musculoskeletal:  2+ dorsalis pedis and radial pulses. Good ROM.  No contractures  Psychiatric: Intact judgment and insight.  Mood appropriate to current condition. Neurologic: No focal neurological deficits. Strength is 5/5 x 4.  CN II - XII grossly intact.   Data Reviewed: Normal sinus rhythm at a rate of 82 bpm.  With ST and T wave abnormality  Assessment and Plan: * Hypokalemia- (present on admission) K+ is 2.1 K+ will be replenished Please monitor for AM K+ for further replenishmemnt   AKI (acute kidney injury) ([place]) BUN/creatinine 25/1.48 (baseline creatinine 0.9-1.0) Continue gentle hydration Renally adjust medications, avoid nephrotoxic agents/dehydration/hypotension    Abnormal EKG EKG personally reviewed showed normal sinus rhythm at a rate of 82 bpm with ST and T wave abnormality in V1-V6 T wave inversion/repolarization abnormality, patient denies any chest pain or any chest discomfort. Troponin will be checked Continue telemetry and repeat EKG in the morning  Type 2 diabetes mellitus (HCC) Continue ISS and hypoglycemia protocol  Obesity (BMI 30-39.9) BMI 32.74; patient counseled on diet and lifestyle modification  Insomnia Continue home Ambien  GERD (gastroesophageal reflux disease) Continue famotidine  Essential hypertension- (present on admission) Controlled, continue amlodipine  Mixed hyperlipidemia Continue statin   Advance Care Planning:   Code Status: Full Code   Consults: None  Family Communication: None at bedside  Severity of Illness: The appropriate patient status for this patient is  OBSERVATION. Observation status is judged to be reasonable and necessary in order to provide the required intensity of service to ensure the patient's safety. The patient's presenting symptoms, physical exam findings, and initial radiographic and laboratory data in the context of their medical condition is felt to place them at  decreased risk for further clinical deterioration. Furthermore, it is anticipated that the patient will be medically stable for discharge from the hospital within 2 midnights of admission.   Author: Bernadette Hoit, DO 08/21/2021 2:01 AM  For on call review [email or web address].

## 2021-08-20 NOTE — Telephone Encounter (Signed)
Spoke with patient. Denies chest pain or palpitations. Recommended proceeding to the ED now for close monitoring and correction of potassium. Patent voiced understanding and stated she will go back to the hospital.

## 2021-08-20 NOTE — Telephone Encounter (Signed)
TRACY FROM ENDO CALLED WITH PATIENT POTASSIUM LEVEL.  IT IS 2.2   SHE HAS A PROCEDURE THIS WEEK

## 2021-08-20 NOTE — ED Triage Notes (Signed)
Pt presents to ED, sent by PCP for low potassium 2.2.

## 2021-08-21 ENCOUNTER — Encounter (HOSPITAL_COMMUNITY): Payer: Self-pay | Admitting: Internal Medicine

## 2021-08-21 DIAGNOSIS — Z888 Allergy status to other drugs, medicaments and biological substances status: Secondary | ICD-10-CM | POA: Diagnosis not present

## 2021-08-21 DIAGNOSIS — N179 Acute kidney failure, unspecified: Secondary | ICD-10-CM

## 2021-08-21 DIAGNOSIS — K2100 Gastro-esophageal reflux disease with esophagitis, without bleeding: Secondary | ICD-10-CM | POA: Diagnosis present

## 2021-08-21 DIAGNOSIS — R9431 Abnormal electrocardiogram [ECG] [EKG]: Secondary | ICD-10-CM

## 2021-08-21 DIAGNOSIS — Z823 Family history of stroke: Secondary | ICD-10-CM | POA: Diagnosis not present

## 2021-08-21 DIAGNOSIS — Z880 Allergy status to penicillin: Secondary | ICD-10-CM | POA: Diagnosis not present

## 2021-08-21 DIAGNOSIS — E782 Mixed hyperlipidemia: Secondary | ICD-10-CM | POA: Diagnosis present

## 2021-08-21 DIAGNOSIS — E785 Hyperlipidemia, unspecified: Secondary | ICD-10-CM | POA: Diagnosis not present

## 2021-08-21 DIAGNOSIS — K2289 Other specified disease of esophagus: Secondary | ICD-10-CM | POA: Diagnosis not present

## 2021-08-21 DIAGNOSIS — K449 Diaphragmatic hernia without obstruction or gangrene: Secondary | ICD-10-CM | POA: Diagnosis not present

## 2021-08-21 DIAGNOSIS — Z91040 Latex allergy status: Secondary | ICD-10-CM | POA: Diagnosis not present

## 2021-08-21 DIAGNOSIS — Z9049 Acquired absence of other specified parts of digestive tract: Secondary | ICD-10-CM | POA: Diagnosis not present

## 2021-08-21 DIAGNOSIS — E119 Type 2 diabetes mellitus without complications: Secondary | ICD-10-CM

## 2021-08-21 DIAGNOSIS — Z20822 Contact with and (suspected) exposure to covid-19: Secondary | ICD-10-CM | POA: Diagnosis present

## 2021-08-21 DIAGNOSIS — M797 Fibromyalgia: Secondary | ICD-10-CM | POA: Diagnosis present

## 2021-08-21 DIAGNOSIS — R634 Abnormal weight loss: Secondary | ICD-10-CM | POA: Diagnosis present

## 2021-08-21 DIAGNOSIS — E669 Obesity, unspecified: Secondary | ICD-10-CM | POA: Diagnosis present

## 2021-08-21 DIAGNOSIS — Z6832 Body mass index (BMI) 32.0-32.9, adult: Secondary | ICD-10-CM | POA: Diagnosis not present

## 2021-08-21 DIAGNOSIS — E876 Hypokalemia: Secondary | ICD-10-CM | POA: Diagnosis not present

## 2021-08-21 DIAGNOSIS — K222 Esophageal obstruction: Secondary | ICD-10-CM | POA: Diagnosis present

## 2021-08-21 DIAGNOSIS — Z86010 Personal history of colonic polyps: Secondary | ICD-10-CM | POA: Diagnosis not present

## 2021-08-21 DIAGNOSIS — Z885 Allergy status to narcotic agent status: Secondary | ICD-10-CM | POA: Diagnosis not present

## 2021-08-21 DIAGNOSIS — E11649 Type 2 diabetes mellitus with hypoglycemia without coma: Secondary | ICD-10-CM | POA: Diagnosis present

## 2021-08-21 DIAGNOSIS — I10 Essential (primary) hypertension: Secondary | ICD-10-CM | POA: Diagnosis not present

## 2021-08-21 DIAGNOSIS — Z90710 Acquired absence of both cervix and uterus: Secondary | ICD-10-CM | POA: Diagnosis not present

## 2021-08-21 DIAGNOSIS — Z881 Allergy status to other antibiotic agents status: Secondary | ICD-10-CM | POA: Diagnosis not present

## 2021-08-21 DIAGNOSIS — Z96651 Presence of right artificial knee joint: Secondary | ICD-10-CM | POA: Diagnosis present

## 2021-08-21 DIAGNOSIS — K219 Gastro-esophageal reflux disease without esophagitis: Secondary | ICD-10-CM | POA: Diagnosis not present

## 2021-08-21 DIAGNOSIS — R1310 Dysphagia, unspecified: Secondary | ICD-10-CM | POA: Diagnosis not present

## 2021-08-21 DIAGNOSIS — G4700 Insomnia, unspecified: Secondary | ICD-10-CM | POA: Diagnosis present

## 2021-08-21 LAB — APTT: aPTT: 32 seconds (ref 24–36)

## 2021-08-21 LAB — GLUCOSE, CAPILLARY
Glucose-Capillary: 136 mg/dL — ABNORMAL HIGH (ref 70–99)
Glucose-Capillary: 121 mg/dL — ABNORMAL HIGH (ref 70–99)
Glucose-Capillary: 125 mg/dL — ABNORMAL HIGH (ref 70–99)

## 2021-08-21 LAB — CBC
HCT: 47.8 % — ABNORMAL HIGH (ref 36.0–46.0)
Hemoglobin: 16.1 g/dL — ABNORMAL HIGH (ref 12.0–15.0)
MCH: 28.3 pg (ref 26.0–34.0)
MCHC: 33.7 g/dL (ref 30.0–36.0)
MCV: 84.2 fL (ref 80.0–100.0)
Platelets: 196 10*3/uL (ref 150–400)
RBC: 5.68 MIL/uL — ABNORMAL HIGH (ref 3.87–5.11)
RDW: 14.6 % (ref 11.5–15.5)
WBC: 6.6 10*3/uL (ref 4.0–10.5)
nRBC: 0 % (ref 0.0–0.2)

## 2021-08-21 LAB — COMPREHENSIVE METABOLIC PANEL
ALT: 21 U/L (ref 0–44)
AST: 24 U/L (ref 15–41)
Albumin: 4.2 g/dL (ref 3.5–5.0)
Alkaline Phosphatase: 77 U/L (ref 38–126)
Anion gap: 16 — ABNORMAL HIGH (ref 5–15)
BUN: 23 mg/dL (ref 8–23)
CO2: 25 mmol/L (ref 22–32)
Calcium: 9.7 mg/dL (ref 8.9–10.3)
Chloride: 94 mmol/L — ABNORMAL LOW (ref 98–111)
Creatinine, Ser: 1.26 mg/dL — ABNORMAL HIGH (ref 0.44–1.00)
GFR, Estimated: 45 mL/min — ABNORMAL LOW (ref >60)
Glucose, Bld: 115 mg/dL — ABNORMAL HIGH (ref 70–99)
Potassium: 2.4 mmol/L — CL (ref 3.5–5.1)
Sodium: 135 mmol/L (ref 135–145)
Total Bilirubin: 0.9 mg/dL (ref 0.3–1.2)
Total Protein: 7.1 g/dL (ref 6.5–8.1)

## 2021-08-21 LAB — TROPONIN I (HIGH SENSITIVITY)
Troponin I (High Sensitivity): 12 ng/L (ref <18)
Troponin I (High Sensitivity): 12 ng/L (ref <18)

## 2021-08-21 LAB — HEMOGLOBIN A1C
Hgb A1c MFr Bld: 5.6 % (ref 4.8–5.6)
Mean Plasma Glucose: 114.02 mg/dL

## 2021-08-21 LAB — PHOSPHORUS: Phosphorus: 1.6 mg/dL — ABNORMAL LOW (ref 2.5–4.6)

## 2021-08-21 LAB — MAGNESIUM: Magnesium: 2 mg/dL (ref 1.7–2.4)

## 2021-08-21 LAB — CBG MONITORING, ED
Glucose-Capillary: 134 mg/dL — ABNORMAL HIGH (ref 70–99)
Glucose-Capillary: 122 mg/dL — ABNORMAL HIGH (ref 70–99)

## 2021-08-21 MED ORDER — NYSTATIN 100000 UNIT/GM EX POWD
Freq: Two times a day (BID) | CUTANEOUS | Status: DC
  Filled 2021-08-21 (×2): qty 15

## 2021-08-21 MED ORDER — INSULIN ASPART 100 UNIT/ML IJ SOLN: 0.0000 [IU] | Freq: Every day | INTRAMUSCULAR | Status: DC

## 2021-08-21 MED ORDER — ENOXAPARIN SODIUM 40 MG/0.4ML IJ SOSY
40.0000 mg | PREFILLED_SYRINGE | INTRAMUSCULAR | Status: DC
  Filled 2021-08-21: qty 0.4

## 2021-08-21 MED ORDER — FAMOTIDINE 20 MG PO TABS: 20.0000 mg | ORAL_TABLET | ORAL | Status: DC | PRN

## 2021-08-21 MED ORDER — SODIUM CHLORIDE 0.9 % IV SOLN: INTRAVENOUS | Status: DC

## 2021-08-21 MED ORDER — POTASSIUM CHLORIDE 10 MEQ/100ML IV SOLN
10.0000 meq | INTRAVENOUS | Status: AC
  Administered 2021-08-21 (×6): 10 meq via INTRAVENOUS
  Filled 2021-08-21 (×6): qty 100

## 2021-08-21 MED ORDER — AMLODIPINE BESYLATE 5 MG PO TABS
10.0000 mg | ORAL_TABLET | Freq: Every day | ORAL | Status: DC
  Administered 2021-08-21 – 2021-08-23 (×3): 10 mg via ORAL
  Filled 2021-08-21 (×3): qty 2

## 2021-08-21 MED ORDER — ZOLPIDEM TARTRATE 5 MG PO TABS
5.0000 mg | ORAL_TABLET | Freq: Every day | ORAL | Status: DC
  Administered 2021-08-21 – 2021-08-22 (×3): 5 mg via ORAL
  Filled 2021-08-21 (×3): qty 1

## 2021-08-21 MED ORDER — PRAVASTATIN SODIUM 40 MG PO TABS
40.0000 mg | ORAL_TABLET | Freq: Every day | ORAL | Status: DC
  Administered 2021-08-21 – 2021-08-23 (×3): 40 mg via ORAL
  Filled 2021-08-21 (×3): qty 1

## 2021-08-21 MED ORDER — SODIUM CHLORIDE 0.9 % IV SOLN: INTRAVENOUS | Status: AC

## 2021-08-21 MED ORDER — POTASSIUM CHLORIDE CRYS ER 20 MEQ PO TBCR
40.0000 meq | EXTENDED_RELEASE_TABLET | Freq: Two times a day (BID) | ORAL | Status: DC
  Administered 2021-08-21 (×2): 40 meq via ORAL
  Filled 2021-08-21 (×2): qty 2

## 2021-08-21 MED ORDER — ENSURE MAX PROTEIN PO LIQD
11.0000 [oz_av] | Freq: Two times a day (BID) | ORAL | Status: DC
  Administered 2021-08-21 – 2021-08-22 (×2): 11 [oz_av] via ORAL

## 2021-08-21 MED ORDER — INSULIN ASPART 100 UNIT/ML IJ SOLN
0.0000 [IU] | Freq: Three times a day (TID) | INTRAMUSCULAR | Status: DC
  Administered 2021-08-21 (×2): 1 [IU] via SUBCUTANEOUS
  Administered 2021-08-22: 2 [IU] via SUBCUTANEOUS
  Administered 2021-08-22: 1 [IU] via SUBCUTANEOUS
  Administered 2021-08-23: 2 [IU] via SUBCUTANEOUS

## 2021-08-21 MED ORDER — ACETAMINOPHEN 325 MG PO TABS
650.0000 mg | ORAL_TABLET | Freq: Four times a day (QID) | ORAL | Status: DC | PRN
  Administered 2021-08-21 – 2021-08-23 (×7): 650 mg via ORAL
  Filled 2021-08-21 (×7): qty 2

## 2021-08-21 NOTE — Hospital Course (Signed)
Per HPI: [name] is a 76 y.o. female with medical history significant of hypertension, hyperlipidemia, T2DM who presents to the emergency department after being referred to the ED by gastroenterologist.  Patient went for preprocedure check up with her gastroenterologist, work-up was done and she was noted to have low potassium (2.2), so she was asked to go to the ED for further evaluation and management. She complained of fatigue, but denies chest pain, shortness of breath, fever, chills, diarrhea, nausea or vomiting.  08/21/21: Patient was admitted with severe hypokalemia as well as some mild AKI and has been started on supplementation with potassium as well as some IV fluid.  Case discussed with GI with plans for likely EGD in a.m. regarding dysphagia which is a likely cause behind her hypokalemia as she has not been able to take potassium supplementation adequately or have adequate oral intake.

## 2021-08-21 NOTE — Assessment & Plan Note (Addendum)
-  Resume home hypoglycemic regimen -Modified carbohydrate diet has been encouraged. -Continue outpatient follow-up with PCP.

## 2021-08-21 NOTE — Assessment & Plan Note (Addendum)
-  K repleted at time of discharge -Patient will continue home supplementation and/maintenance therapy -Advised to maintain adequate hydration and nutrition. -Repeat basic metabolic panel follow-up visit to assess electrolytes trend and instability.

## 2021-08-21 NOTE — Assessment & Plan Note (Addendum)
-  Resolved with fluid resuscitation -Renal function within normal limits at time of discharge. -Safe to resume home diuretics regimen at adjusted dose -Please repeat basic metabolic panel follow-up visit to assess trend/stability of right renal function.

## 2021-08-21 NOTE — Assessment & Plan Note (Addendum)
-  BMI 32.74 -Low calorie diet and portion control discussed with patient.  -Patient qualify for obesity class I.

## 2021-08-21 NOTE — ED Notes (Signed)
Attempted report x1. 

## 2021-08-21 NOTE — Assessment & Plan Note (Addendum)
Continue statin. 

## 2021-08-21 NOTE — Assessment & Plan Note (Addendum)
-  Appreciate SLP evaluation -GI has been consulted in the having findings of reflux esophagitis and Schatzki ring; last 1 was appropriately dilated. -Patient has been instructed to follow soft diet, to maintain adequate hydration and to take acid reflux medications as prescribed. -Outpatient follow-up with GI service will be arranged.

## 2021-08-21 NOTE — H&P (View-Only) (Signed)
Gastroenterology Consult   Referring Provider: No ref. provider found Primary Care Physician:  Celene Squibb, MD Primary Gastroenterologist:  Dr. Gala Romney  [name]; [id number]; 11/14/1945   Admit date: 08/20/2021  LOS: 0 days   Date of Consultation: 08/21/2021  Reason for Consultation:  dysphagia  History of Present Illness   [name] is a 76 y.o. year old female with history of Anxiety, depression, asthma, chronic constipation, adenomatous colon polyps, diverticulosis, fibromyalgia, GERD, HTN, HLD, insomnia, migraines, and thyroid nodule who presented to the ED at the request of Aliene Altes, PA due to her potassium being 2.2 on her pre-op lab work.    She has had recurrent issues with dysphagia over the years.  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 Newman Pies with [facility].  She has had multiple EGDs, normal GES, and has tried a variety of PPIs.  Manometry was attempted but she was unable to tolerate the probe.  Her last EGD was in April 2021 with Dr. Gala Romney due to uncontrolled GERD/odynophagia revealing nonobstructing Schatzki's ring, small hiatal hernia, abnormal gastric mucosa, and normal examined duodenum.  She had a MBSS in January and September 2022 with findings of mild oral phase dysphagia and diffuse esophageal dysmotility.  September MBSS with slightly decreased bolus cohesion, piecemeal swallowing and mild vallecula and piriform residual.   Her GERD has been well controlled with Aciphex 20 mg twice daily.  Prior to last week she was taking Pepcid twice daily as well.   Patient has been feeling like she cannot swallow foods, having difficulty getting foods to the back of her throat to swallow.  Has not regurgitation no globus sensation.  She reports she has been eating turnip greens, eggs, bacon, sausage and that has been about the most of the meal that she can swallow.  Admits to early satiety and  unintentional weight loss.  Has lost about 25 pounds in 6 months.  She reports that most of her issues of swallowing is with food and pills.  Denies hematemesis, N/V.  Since last week she has not bought any protein shakes to drink in addition to her daily meals.   Past Medical History:  Diagnosis Date   Adenomatous colon polyp 2006   excised in 2006 & 2010Due surveillance 06/2013   Anxiety    Anxiety and depression    Asthma    Breast mass    right nipple bengn mass per patient   Breast tumor    Chest discomfort    Chronic back pain    Chronic constipation    Complication of anesthesia    Depression    Diverticulosis    Fibromyalgia    Gastroesophageal reflux disease    Hiatal hernia    History of benign esophageal tumor 2010   granular cell esophageal tumor (Dx 06/2008), resected via EMR 2011, due repeat EGD 02/2012   Hyperlipidemia    Hypertension    Insomnia    Migraines    PONV (postoperative nausea and vomiting)    Psychosis ([place])    Sleep apnea    Stop Bang score of 5. Pt said she was told by Dr. Merlene Laughter that she had "a little bit" of sleep apena, but not bad enough to treat.   Thyroid nodule    Tumor of esophagus     Past Surgical History:  Procedure Laterality Date   ABDOMINAL HYSTERECTOMY     BIOPSY  10/14/2019   Procedure: BIOPSY;  Surgeon: Daneil Dolin, MD;  Location: [facility];  Service: Endoscopy;;   BRAVO [place] STUDY  12/11/2011   Procedure: BRAVO [place];  Surgeon: Daneil Dolin, MD;  Location: [facility];  Service: Endoscopy;  Laterality: N/A;   BREAST EXCISIONAL BIOPSY  1990s, 2012   Left x2-sclerosing ductal papilloma-2012   CHOLECYSTECTOMY N/A 04/09/2013   Procedure: LAPAROSCOPIC CHOLECYSTECTOMY;  Surgeon: Jamesetta So, MD;  Location: [facility];  Service: General;  Laterality: N/A;   COLONOSCOPY  06/2008, 06/2011   sigmoid tics, tubular adenoma; 2013: anal canal hemorrhoids   COLONOSCOPY  07/10/2011   Anal canal hemorrhoids likely the cause of  hematochezia in the setting of constipation; otherwise normal rectum ;submucosal  petechiae in left colon of doubtful clinical significance; otherwise, normal colon   COLONOSCOPY WITH PROPOFOL N/A 11/10/2017   cancelled in pre-op   COLONOSCOPY WITH PROPOFOL N/A 07/23/2018   Procedure: COLONOSCOPY WITH PROPOFOL;  Surgeon: Daneil Dolin, MD; 1 tubular adenoma, diverticulosis in the sigmoid and descending colon, nonbleeding internal hemorrhoids.  No recommendations to repeat due to age.   ESOPHAGEAL DILATION N/A 05/08/2015   Procedure: ESOPHAGEAL DILATION;  Surgeon: Daneil Dolin, MD;  Location: [facility];  Service: Endoscopy;  Laterality: N/AVenia Minks 54/56   ESOPHAGOGASTRODUODENOSCOPY  12/11/2011   IPJ:[id number] Schatzki's ring; otherwise normal/Small hiatal hernia. Antral and bulbar erosions   ESOPHAGOGASTRODUODENOSCOPY (EGD) WITH PROPOFOL N/A 05/08/2015   Dr. Gala Romney: mild erosive reflux esophagitis, non-critical Schatzki's ring s/p dilation. Hiatal hernia.    ESOPHAGOGASTRODUODENOSCOPY (EGD) WITH PROPOFOL N/A 10/14/2019   Procedure: ESOPHAGOGASTRODUODENOSCOPY (EGD) WITH PROPOFOL;  Surgeon: Daneil Dolin, MD;  Nonobstructing Schatzki ring, small hiatal hernia, abnormal gastric mucosa s/p biopsied, normal first and second portion of the duodenum.  Pathology with chronic inactive gastritis, no H. pylori.   EUS  08/2010   Dr [facility] with EGD. Retained food. No recurrent esophageal lesion, bx negative.   IR KYPHO THORACIC WITH BONE BIOPSY  10/17/2020   IR RADIOLOGIST EVAL & MGMT  09/12/2020   LUNG BIOPSY     negative   PARATHYROIDECTOMY  April 2017   [facility].    PARATHYROIDECTOMY     POLYPECTOMY  07/23/2018   Procedure: POLYPECTOMY;  Surgeon: Daneil Dolin, MD;  [facility];  Service: Endoscopy;;  colon   RIGHT OOPHORECTOMY     benign disease   SHOULDER ARTHROSCOPY     Right; bone spurs removed   TONSILLECTOMY     TOTAL KNEE ARTHROPLASTY  2002   Right; previous arthroscopic  surgery    Prior to Admission medications   Medication Sig Start Date End Date Taking? Authorizing Provider  AIMOVIG 70 MG/ML SOAJ Inject 70 mg into the skin every 30 (thirty) days. 05/23/20  Yes [provider]  albuterol (PROVENTIL HFA;VENTOLIN HFA) 108 (90 BASE) MCG/ACT inhaler Inhale 2 puffs into the lungs every 6 (six) hours as needed for wheezing or shortness of breath.    Yes [provider]  amLODipine (NORVASC) 10 MG tablet Take 10 mg by mouth daily. 12/18/15  Yes [provider]  Aspirin-Acetaminophen-Caffeine (GOODY HEADACHE PO) Take 1 packet by mouth daily as needed (headaches/pain).   Yes [provider]  atenolol (TENORMIN) 100 MG tablet TAKE 1 TABLET BY MOUTH  DAILY 01/25/21  Yes Strader, [place] M, PA-C  atenolol (TENORMIN) 25 MG tablet TAKE 1 TABLET BY MOUTH  DAILY IN ADDITION TO 100MG   TABLET DAILY FOR A TOTAL OF  125MG  DAILY 01/25/21  Yes Strader, [place] M, PA-C  calcium carbonate (TUMS - DOSED IN MG ELEMENTAL CALCIUM) 500 MG chewable tablet Chew 1,500 mg by mouth daily as needed for indigestion or heartburn.   Yes [provider]  CALCIUM-VITAMIN D PO Take 1 tablet by mouth daily.   Yes [provider]  cetirizine (ZYRTEC) 10 MG tablet Take 10 mg by mouth daily as needed for allergies.   Yes [provider]  chlorthalidone (HYGROTON) 25 MG tablet Take 1/2 tablet by mouth daily. 11/02/19  Yes Branch, Alphonse Guild, MD  cycloSPORINE (RESTASIS) 0.05 % ophthalmic emulsion Place 1 drop into both eyes 2 (two) times daily as needed (dry eyes).   Yes [provider]  famotidine (PEPCID) 20 MG tablet Take 1 tablet (20 mg total) by mouth as needed for heartburn or indigestion. 06/21/20  Yes Erenest Rasher, PA-C  ferrous sulfate 325 (65 FE) MG tablet Take 325 mg by mouth daily with breakfast. Every other day   Yes [provider]  fluticasone (FLONASE) 50 MCG/ACT nasal spray Place 1 spray into both nostrils daily  as needed for allergies.    Yes [provider]  furosemide (LASIX) 80 MG tablet Take 1 tablet (80 mg total) by mouth every morning AND 0.5 tablets (40 mg total) every evening. 07/20/21 10/18/21 Yes Branch, Alphonse Guild, MD  gabapentin (NEURONTIN) 100 MG capsule Take 100 mg by mouth 3 (three) times daily. 100mg  threes times daily and 300mg  at bedtime   Yes [provider]  gabapentin (NEURONTIN) 300 MG capsule Take 300 mg by mouth at bedtime. 06/12/21  Yes [provider]  HYDROcodone-acetaminophen (NORCO/VICODIN) 5-325 MG tablet Take 1 tablet by mouth 2 (two) times daily as needed for moderate pain.   Yes [provider]  INGREZZA 80 MG CAPS Take 80 mg by mouth daily. 04/28/20  Yes [provider]  levothyroxine (SYNTHROID) 50 MCG tablet Take 50 mcg by mouth daily. 07/24/21  Yes [provider]  lovastatin (MEVACOR) 40 MG tablet Take 40 mg by mouth at bedtime.   Yes [provider]  Melatonin 3 MG TABS Take 9 mg by mouth at bedtime.    Yes [provider]  memantine (NAMENDA) 10 MG tablet Take 10 mg by mouth 2 (two) times daily.  10/20/17  Yes [provider]  naloxegol oxalate (MOVANTIK) 25 MG TABS tablet Take 25 mg by mouth daily.   Yes [provider]  OLANZapine (ZYPREXA) 5 MG tablet Take 5 mg by mouth at bedtime. 07/26/21  Yes [provider]  potassium chloride SA (KLOR-CON) 20 MEQ tablet Take 2 tablets (40 meq) by mouth in the morning and take 1 tablet (20 meq) by mouth in the evening 04/12/21  Yes Strader, [place] M, PA-C  RABEprazole (ACIPHEX) 20 MG tablet Take 1 tablet (20 mg total) by mouth 2 (two) times daily. 02/07/21  Yes Harper, Kristen S, PA-C  Semaglutide (RYBELSUS) 7 MG TABS Take 7 mg by mouth daily.   Yes [provider]  traZODone (DESYREL) 100 MG tablet Take 100 mg by mouth at bedtime. 07/31/17  Yes [provider]  triamcinolone cream (KENALOG) 0.1 % Apply 1 application  topically 2 (two) times daily. 08/07/21  Yes [provider]  venlafaxine XR (EFFEXOR-XR) 150 MG 24 hr capsule Take 150 mg by mouth at bedtime. Take with 75 mg to equal 225 mg at bedtime   Yes [provider]  venlafaxine XR (EFFEXOR-XR) 75 MG 24  hr capsule Take 75 mg by mouth at bedtime. Take with 150 mg to equal 225 mg at bedtime   Yes [provider]  zolpidem (AMBIEN) 10 MG tablet Take 10 mg by mouth at bedtime. 09/05/20  Yes [provider]    Current Facility-Administered Medications  Medication Dose Route Frequency Provider Last Rate Last Admin   0.9 %  sodium chloride infusion   Intravenous Continuous Heath Lark D, DO 75 mL/hr at 08/21/21 1317 New Bag at 08/21/21 1317   acetaminophen (TYLENOL) tablet 650 mg  650 mg Oral Q6H PRN Adefeso, Oladapo, DO   650 mg at 08/21/21 1607   amLODipine (NORVASC) tablet 10 mg  10 mg Oral Daily Adefeso, Oladapo, DO   10 mg at 08/21/21 0842   [START ON 08/23/2021] enoxaparin (LOVENOX) injection 40 mg  40 mg Subcutaneous Q24H Catina Nuss L, NP       famotidine (PEPCID) tablet 20 mg  20 mg Oral PRN Adefeso, Oladapo, DO       insulin aspart (novoLOG) injection 0-5 Units  0-5 Units Subcutaneous QHS Adefeso, Oladapo, DO       insulin aspart (novoLOG) injection 0-9 Units  0-9 Units Subcutaneous TID WC Adefeso, Oladapo, DO   1 Units at 08/21/21 1228   nystatin (MYCOSTATIN/NYSTOP) topical powder   Topical BID Heath Lark D, DO   Given at 08/21/21 1115   potassium chloride SA (KLOR-CON M) CR tablet 40 mEq  40 mEq Oral BID Heath Lark D, DO   40 mEq at 08/21/21 9563   pravastatin (PRAVACHOL) tablet 40 mg  40 mg Oral q1800 Adefeso, Oladapo, DO   40 mg at 08/21/21 1607   zolpidem (AMBIEN) tablet 5 mg  5 mg Oral QHS Adefeso, Oladapo, DO   5 mg at 08/21/21 0135    Allergies as of 08/20/2021 - Review Complete 08/20/2021  Allergen Reaction Noted   Elavil [amitriptyline] Other (See Comments) 06/03/2012   Abilify [aripiprazole]  Other (See Comments) 11/25/2012   Codeine Hives, Nausea Only, and Other (See Comments)    Latex Hives 11/21/2011   Penicillins Hives, Itching, and Other (See Comments)    Polyethylene glycol Other (See Comments) 12/26/2012   Sulfonamide derivatives Nausea And Vomiting    Cymbalta [duloxetine hcl] Rash 03/10/2013   Remeron [mirtazapine] Other (See Comments) 05/06/2012    Family History  Problem Relation Age of Onset   Stroke Father    Alcohol abuse Father    Heart attack Mother    Depression Mother    Anxiety disorder Mother    Colon cancer Paternal Aunt    Colon cancer Paternal Uncle    Dementia Maternal Uncle    ADD / ADHD Neg Hx    Bipolar disorder Neg Hx    Drug abuse Neg Hx    OCD Neg Hx    Paranoid behavior Neg Hx    Schizophrenia Neg Hx    Seizures Neg Hx    Sexual abuse Neg Hx    Physical abuse Neg Hx     Social History   Socioeconomic History   Marital status: Single    Spouse name: Not on file   Number of children: 1   Years of education: Not on file   Highest education level: Not on file  Occupational History   Occupation: disabled    Employer: RETIRED  Tobacco Use   Smoking status: Never   Smokeless tobacco: Never  Vaping Use   Vaping Use: Never used  Substance and Sexual Activity  Alcohol use: No    Alcohol/week: 0.0 standard drinks   Drug use: No   Sexual activity: Never  Other Topics Concern   Not on file  Social History Narrative   Not on file   Social Determinants of Health   Financial Resource Strain: Not on file  Food Insecurity: Not on file  Transportation Needs: Not on file  Physical Activity: Not on file  Stress: Not on file  Social Connections: Not on file  Intimate Partner Violence: Not on file     Review of Systems   Gen: Denies any fever, chills, loss of appetite, change in weight or weight loss CV: Denies chest pain, heart palpitations, syncope, edema  Resp: Denies shortness of breath with rest, cough, wheezing,  coughing up blood, and pleurisy. GI: see HPI GU : Denies urinary burning, blood in urine, urinary frequency, and urinary incontinence. MS: Denies joint pain, limitation of movement, swelling, cramps, and atrophy.  Derm: Denies rash, itching, dry skin, hives. Psych: Denies depression, anxiety, memory loss, hallucinations, and confusion. Heme: Denies bruising or bleeding Neuro:  Denies any headaches, dizziness, paresthesias, shaking  Physical Exam   Vital Signs in last 24 hours: Temp:  [98.3 F (36.8 C)-99 F (37.2 C)] 98.6 F (37 C) (02/28 1646) Pulse Rate:  [64-88] 69 (02/28 1646) Resp:  [8-18] 18 (02/28 1646) BP: (113-152)/(63-106) 120/106 (02/28 1646) SpO2:  [98 %-100 %] 100 % (02/28 1646) Weight:  [80.6 kg-81.2 kg] 80.6 kg (02/28 0828) Last BM Date : 08/17/21  General:   Alert,  Well-developed, pleasant and cooperative in NAD Head:  Normocephalic and atraumatic. Eyes:  Sclera clear, no icterus. Ears:  Normal auditory acuity. Neck:  Supple; no masses Lungs:  Clear throughout to auscultation.   No wheezes, crackles, or rhonchi. No acute distress. Heart:  Regular rate and rhythm; no murmurs, clicks, rubs, or gallops. Abdomen:  Soft, nontender and nondistended. No masses, hepatosplenomegaly or hernias noted. Normal bowel sounds, without guarding, and without rebound.   Rectal: deferred Msk:  Symmetrical without gross deformities. Normal posture. Extremities:  Without clubbing or edema. Neurologic:  Alert and  oriented x4. Skin:  Intact without significant lesions or rashes. Psych:  Alert and cooperative. Normal mood and affect.  Intake/Output from previous day: No intake/output data recorded. Intake/Output this shift: Total I/O In: 1336.3 [P.O.:240; I.V.:598.7; IV Piggyback:497.6] Out: -    Labs/Studies   Recent Labs Recent Labs    08/20/21 1132 08/20/21 1849 08/21/21 0213  WBC 5.5 7.6 6.6  HGB 15.9* 16.5* 16.1*  HCT 49.9* 51.5* 47.8*  PLT 188 229 196    BMET Recent Labs    08/20/21 1132 08/20/21 1849 08/21/21 0213  NA 137 135 135  K 2.2* 2.1* 2.4*  CL 93* 90* 94*  CO2 31 28 25   GLUCOSE 119* 102* 115*  BUN 24* 25* 23  CREATININE 1.37* 1.48* 1.26*  CALCIUM 9.7 10.2 9.7   LFT Recent Labs    08/20/21 1849 08/21/21 0213  PROT 7.9 7.1  ALBUMIN 4.6 4.2  AST 25 24  ALT 22 21  ALKPHOS 81 77  BILITOT 0.5 0.9   PT/INR No results for input(s): LABPROT, INR in the last 72 hours. Hepatitis Panel No results for input(s): HEPBSAG, HCVAB, HEPAIGM, HEPBIGM in the last 72 hours. C-Diff No results for input(s): CDIFFTOX in the last 72 hours.  Radiology/Studies No results found.   Assessment   [name] is a 76 y.o. year old female with history of Anxiety, depression, asthma,  chronic constipation, adenomatous colon polyps, diverticulosis, fibromyalgia, GERD, HTN, HLD, insomnia, migraines, and thyroid nodule who presented to the ED at the request of Aliene Altes, PA due to her potassium being 2.2 on her pre-op lab work. GI consulted for dysphagia.   Dysphagia: Has had ongoing dysphagia, with primary oral symptoms.  She has denied globus sensation or regurgitation.  She has a history of Schatzki's ring on her last EGD in April 2021.  She had 2 modified barium swallows in 2022 with most recent in September which demonstrated oropharyngeal dysphagia.  She has had about a 25 pound weight loss in the last 6 months due to trouble with eating meats and several other foods.  She was encouraged to Aliene Altes, [place] who saw her last week to stick to a soft moist diet taking smaller bites and alternating with sips of liquids.  And was also instructed to try 2 protein shakes daily along with 3 regular meals. Per patient request, will have EGD done tomorrow instead of outpatient on Thursday.    GERD: Continue with Aciphex 20 mg twice daily.  Should only use Pepcid 20 mg as needed.  Continue to avoid all NSAID products and Goody powders.      Plan / Recommendations    Proceed with EGD +/- dilation by Dr. Laural Golden tomorrow the risks, benefits, and alternatives have been discussed with the patient in detail. The patient states understanding and desires to proceed.  Should follow-up in the clinic in about 7 weeks post procedure as instructed at her last visit. Continue Aciphex 20 mg BID, pepcid as needed. Avoid all NSAID products, including goody powders. Will hold AM dose of Lovenox.    08/21/2021, 5:04 PM  Venetia Night, MSN, FNP-BC, AGACNP-BC [facility]

## 2021-08-21 NOTE — Assessment & Plan Note (Addendum)
-  Continue home Ambien -Sleep hygiene discussed with patient.

## 2021-08-21 NOTE — Assessment & Plan Note (Addendum)
-  Discharge on Protonix twice a day and nightly famotidine. -Lifestyle changes modifications and diet of choice discussed with patient. -Patient has been instructed to maintain adequate hydration -Outpatient follow-up with gastroenterology service will be arranged. -Based on endoscopy evaluation there is the possibility of Candida esophagitis; cultures has been taken and if positive GI service will provide/prescribe adequate treatment for her.

## 2021-08-21 NOTE — Progress Notes (Signed)
Initial Nutrition Assessment  DOCUMENTATION CODES:   Obesity unspecified  INTERVENTION:  Encourage-Ensure Enlive po BID   Follow diet progression   NUTRITION DIAGNOSIS:   Inadequate oral intake related to acute illness and dysphagia as evidenced by per patient/family report, mild muscle depletion, meal completion < 50%, unintentional wt loss trend.   GOAL:  Patient will meet greater than or equal to 90% of their needs   MONITOR:  PO intake, Supplement acceptance, Weight trends  REASON FOR ASSESSMENT:   Malnutrition Screening Tool    ASSESSMENT: Patient is an obese 76 yo female with hx of DM2 (A1C-5.6%), HTN, HLD and GERD.   Presents with severe hypokalemia and dysphagia.   EGD scheduled for tomorrow with possible dilation. SLP evaluation completed. Dysphagia 2/thin recommended. Mild aspiration risk.  Patient po-50% of dinner. Able to feed herself.  NPO earlier today.    Patient weight has been trending down since last October from 87.6 kg to current 80.6 kg which is 8% in 4 months. Patient reports weight loss as unintentional.   Medications: insulin, potassium chloride.  IVF- NS @ 75 ml/hr  Labs: Potassium 2.4 (L), Cr 1.26, Phosphorus  1.6 (L)    NUTRITION - FOCUSED PHYSICAL EXAM:  Flowsheet Row Most Recent Value  Orbital Region No depletion  Upper Arm Region No depletion  Thoracic and Lumbar Region No depletion  Buccal Region No depletion  Temple Region Mild depletion  Clavicle Bone Region Mild depletion  Clavicle and Acromion Bone Region No depletion  Scapular Bone Region No depletion  Dorsal Hand No depletion  Edema (RD Assessment) Mild  Hair Reviewed  Eyes Reviewed  Mouth Reviewed  Skin Reviewed  Nails Reviewed       Diet Order:   Diet Order             Diet NPO time specified Except for: Sips with Meds  Diet effective midnight           Diet Heart Room service appropriate? Yes; Fluid consistency: Thin  Diet effective now                    EDUCATION NEEDS:  Not appropriate for education at this time  Skin:  Skin Assessment: Reviewed RN Assessment  Last BM:  2/24  Height:   Ht Readings from Last 1 Encounters:  08/21/21 5\' 2"  (1.575 m)    Weight:   Wt Readings from Last 1 Encounters:  08/21/21 80.6 kg    Ideal Body Weight:   50 kg   BMI:  Body mass index is 32.5 kg/m.  Estimated Nutritional Needs:   Kcal:  1515- 1642  Protein:  70-75 gr  Fluid:  >1600 ml daily   Colman Cater MS,RD,CSG,LDN Contact: Shea Evans

## 2021-08-21 NOTE — ED Notes (Signed)
CBG 134 

## 2021-08-21 NOTE — Assessment & Plan Note (Addendum)
-  Troponin within normal limits  -In the setting of electrolyte abnormality -Resolved after electrolytes repleted. -No chest pain or shortness of breath at discharge.

## 2021-08-21 NOTE — Evaluation (Signed)
Clinical/Bedside Swallow Evaluation Patient Details  Name: [name] MRN: [id number] Date of Birth: [birth year]  Today's Date: 08/21/2021 Time: SLP Start Time (ACUTE ONLY): 8295 SLP Stop Time (ACUTE ONLY): 1458 SLP Time Calculation (min) (ACUTE ONLY): 22 min  Past Medical History:  Past Medical History:  Diagnosis Date   Adenomatous colon polyp 2006   excised in 2006 & 2010Due surveillance 06/2013   Anxiety    Anxiety and depression    Asthma    Breast mass    right nipple bengn mass per patient   Breast tumor    Chest discomfort    Chronic back pain    Chronic constipation    Complication of anesthesia    Depression    Diverticulosis    Fibromyalgia    Gastroesophageal reflux disease    Hiatal hernia    History of benign esophageal tumor 2010   granular cell esophageal tumor (Dx 06/2008), resected via EMR 2011, due repeat EGD 02/2012   Hyperlipidemia    Hypertension    Insomnia    Migraines    PONV (postoperative nausea and vomiting)    Psychosis ([place])    Sleep apnea    Stop Bang score of 5. Pt said she was told by Dr. Merlene Laughter that she had "a little bit" of sleep apena, but not bad enough to treat.   Thyroid nodule    Tumor of esophagus    Past Surgical History:  Past Surgical History:  Procedure Laterality Date   ABDOMINAL HYSTERECTOMY     BIOPSY  10/14/2019   Procedure: BIOPSY;  Surgeon: Daneil Dolin, MD;  [facility];  Service: Endoscopy;;   BRAVO [place] STUDY  12/11/2011   Procedure: BRAVO [place];  Surgeon: Daneil Dolin, MD;  [facility];  Service: Endoscopy;  Laterality: N/A;   BREAST EXCISIONAL BIOPSY  1990s, 2012   Left x2-sclerosing ductal papilloma-2012   CHOLECYSTECTOMY N/A 04/09/2013   Procedure: LAPAROSCOPIC CHOLECYSTECTOMY;  Surgeon: Jamesetta So, MD;  Location: [facility];  Service: General;  Laterality: N/A;   COLONOSCOPY  06/2008, 06/2011   sigmoid tics, tubular adenoma; 2013: anal canal hemorrhoids   COLONOSCOPY   07/10/2011   Anal canal hemorrhoids likely the cause of hematochezia in the setting of constipation; otherwise normal rectum ;submucosal  petechiae in left colon of doubtful clinical significance; otherwise, normal colon   COLONOSCOPY WITH PROPOFOL N/A 11/10/2017   cancelled in pre-op   COLONOSCOPY WITH PROPOFOL N/A 07/23/2018   Procedure: COLONOSCOPY WITH PROPOFOL;  Surgeon: Daneil Dolin, MD; 1 tubular adenoma, diverticulosis in the sigmoid and descending colon, nonbleeding internal hemorrhoids.  No recommendations to repeat due to age.   ESOPHAGEAL DILATION N/A 05/08/2015   Procedure: ESOPHAGEAL DILATION;  Surgeon: Daneil Dolin, MD;  Location: [facility];  Service: Endoscopy;  Laterality: N/AVenia Minks 54/56   ESOPHAGOGASTRODUODENOSCOPY  12/11/2011   AOZ:[id number] Schatzki's ring; otherwise normal/Small hiatal hernia. Antral and bulbar erosions   ESOPHAGOGASTRODUODENOSCOPY (EGD) WITH PROPOFOL N/A 05/08/2015   Dr. Gala Romney: mild erosive reflux esophagitis, non-critical Schatzki's ring s/p dilation. Hiatal hernia.    ESOPHAGOGASTRODUODENOSCOPY (EGD) WITH PROPOFOL N/A 10/14/2019   Procedure: ESOPHAGOGASTRODUODENOSCOPY (EGD) WITH PROPOFOL;  Surgeon: Daneil Dolin, MD;  Nonobstructing Schatzki ring, small hiatal hernia, abnormal gastric mucosa s/p biopsied, normal first and second portion of the duodenum.  Pathology with chronic inactive gastritis, no H. pylori.   EUS  08/2010   Dr [facility] with EGD. Retained food. No recurrent esophageal lesion, bx  negative.   IR KYPHO THORACIC WITH BONE BIOPSY  10/17/2020   IR RADIOLOGIST EVAL & MGMT  09/12/2020   LUNG BIOPSY     negative   PARATHYROIDECTOMY  April 2017   [facility].    PARATHYROIDECTOMY     POLYPECTOMY  07/23/2018   Procedure: POLYPECTOMY;  Surgeon: Daneil Dolin, MD;  [facility];  Service: Endoscopy;;  colon   RIGHT OOPHORECTOMY     benign disease   SHOULDER ARTHROSCOPY     Right; bone spurs removed   TONSILLECTOMY     TOTAL KNEE  ARTHROPLASTY  2002   Right; previous arthroscopic surgery   HPI:  RESHONDA Houston is a 76 y.o. female with medical history significant of hypertension, hyperlipidemia, T2DM who presents to the emergency department after being referred to the ED by gastroenterologist.  Patient went for preprocedure check up with her gastroenterologist, work-up was done and she was noted to have low potassium (2.2), so she was asked to go to the ED for further evaluation and management. Pt has had 2 MBSS per chart review, most recent completed 02/2021 - detailed below - recommending regular thin.   MBSS completed 02/2021 <<Pt presents with normal to mild oropharyngeal dysphagia with very similar presentaiton to MBS in January of 2022 (detailed above). Swallowing is characterized by prolonged oral prep, slightly decreased bolus cohesion, piecemeal swallowing and mild valleculae and pyriform residue that is cleared by a reflexive repeat swallow. Pt may have slightly worsening presentation today (increased number of swallows and increased residue) however, no penetration or aspiraiton was noted on this study and as Pt takes her time and produces additional swallows, her swallow is functional. Pill passed through oral cavity with initial sip of thin liquids and was visualized via esophageal sweep passing through without incident (radiologist present to confirm). Recommend continue regular diet with thin liquids; recommend meds whole in applesauce or yogurt if continued difficulty is reported with swallowing meds. Recommend alternate bites and sips, multiple dry swallows and slow consumption rate. There are no further ST needs noted at this time.>>          Assessment / Plan / Recommendation  Clinical Impression  Clinical swallowing evaluation completed while Pt was sitting upright in bed; Pt was pleasant and cooperative. Note MBS most recenly completed 9/22 reporting oropharyngeal swallowing to be largely [facility]. At bedside today,  Pt does demonstrate oral dysphagia characterized by very prolonged AP, uncoordinated and poor bolus cohesion and moderate oral residue after the initial swallow with regular trials. With puree note very slow AP transit and suspected delayed swallowing trigger. Pt benefits from liquid wash to facilitate clearance of solid trials/textures. Thin liquids were consumed without incident. Despite oral dysfunction, no overt s/sx of aspiration were noted/visualized. Pt does report globus sensation stating: " food just won't go down". She further reports that she does "NOT get choked or strangled". Note scheduled EGD for tomorrow AM. Recommend downgrade Pt's diet to D2/fine chop (Pt requested to NOT be placed on a puree diet) and continue with thin liquids and defer to GI for further treatment of possible esophageal dysphagia. ST will continue to follow to ensure tolerance of D2 diet and to implement strategies to facilitate oral stage of swallowing. Thank you for this referral, SLP Visit Diagnosis: Dysphagia, unspecified (R13.10)    Aspiration Risk  Mild aspiration risk    Diet Recommendation Dysphagia 2 (Fine chop);Thin liquid   Liquid Administration via: Straw;Cup Medication Administration: Crushed with puree Supervision: Patient  able to self feed;Intermittent supervision to cue for compensatory strategies Compensations: Minimize environmental distractions;Slow rate;Small sips/bites    Other  Recommendations Oral Care Recommendations: Oral care BID    Recommendations for follow up therapy are one component of a multi-disciplinary discharge planning process, led by the attending physician.  Recommendations may be updated based on patient status, additional functional criteria and insurance authorization.  Follow up Recommendations        Assistance Recommended at Discharge    Functional Status Assessment    Frequency and Duration min 1 x/week  1 week       Prognosis Prognosis for Safe Diet  Advancement: Fair      Swallow Study   General Date of Onset: 08/20/21 HPI: ELLEANNA MELLING is a 76 y.o. female with medical history significant of hypertension, hyperlipidemia, T2DM who presents to the emergency department after being referred to the ED by gastroenterologist.  Patient went for preprocedure check up with her gastroenterologist, work-up was done and she was noted to have low potassium (2.2), so she was asked to go to the ED for further evaluation and management. Pt has had 2 MBSS per chart review, most recent completed 02/2021 - detailed below - recommending regular thin. Type of Study: Bedside Swallow Evaluation Previous Swallow Assessment: MBSS completed 06/2020 and 02/2021 Diet Prior to this Study: Thin liquids;Regular Temperature Spikes Noted: No Respiratory Status: Room air History of Recent Intubation: No Behavior/Cognition: Alert;Cooperative;Pleasant mood Oral Care Completed by SLP: Recent completion by staff Oral Cavity - Dentition: Dentures, top Vision: Functional for self-feeding Self-Feeding Abilities: Able to feed self Patient Positioning: Upright in bed Baseline Vocal Quality: Normal Volitional Cough: Strong Volitional Swallow: Able to elicit    Oral/Motor/Sensory Function Overall Oral Motor/Sensory Function: Within functional limits   Ice Chips Ice chips: Within functional limits   Thin Liquid Thin Liquid: Within functional limits    Nectar Thick Nectar Thick Liquid: Not tested   Honey Thick Honey Thick Liquid: Not tested   Puree Puree: Impaired Presentation: Spoon Oral Phase Impairments: Impaired mastication;Reduced lingual movement/coordination Oral Phase Functional Implications: Prolonged oral transit;Oral residue   Solid     Solid: Impaired Oral Phase Impairments: Reduced lingual movement/coordination;Impaired mastication Oral Phase Functional Implications: Impaired mastication;Prolonged oral transit;Oral residue     Steele Stracener H. Roddie Mc,  CCC-SLP Speech Language Pathologist  Wende Bushy 08/21/2021,3:27 PM

## 2021-08-21 NOTE — Progress Notes (Addendum)
°  Transition of Care [facility]) Screening Note   Patient Details  Name: [name] Date of Birth: 1946/03/04   Transition of Care [facility]) CM/SW Contact:    Boneta Lucks, RN Phone Number: 08/21/2021, 11:18 AM  Follow up appointment on AVS.  Transition of Care Department (TOC) has reviewed patient and no TOC needs have been identified at this time. We will continue to monitor patient advancement through interdisciplinary progression rounds. If new patient transition needs arise, please place a TOC consult.

## 2021-08-21 NOTE — ED Notes (Signed)
Report called to [place] on [phone].

## 2021-08-21 NOTE — Consult Note (Signed)
Gastroenterology Consult   Referring Provider: No ref. provider found Primary Care Physician:  Donna Squibb, MD Primary Gastroenterologist:  Dr. Gala Houston  [name]; [id number]; 11/28/1945   Admit date: 08/20/2021  LOS: 0 days   Date of Consultation: 08/21/2021  Reason for Consultation:  dysphagia  History of Present Illness   [name] is a 76 y.o. year old female with history of Anxiety, depression, asthma, chronic constipation, adenomatous colon polyps, diverticulosis, fibromyalgia, GERD, HTN, HLD, insomnia, migraines, and thyroid nodule who presented to the ED at the request of Donna Altes, PA due to her potassium being 2.2 on her pre-op lab work.    She has had recurrent issues with dysphagia over the years.  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 Newman Houston with [facility].  She has had multiple EGDs, normal GES, and has tried a variety of PPIs.  Manometry was attempted but she was unable to tolerate the probe.  Her last EGD was in April 2021 with Dr. Gala Houston due to uncontrolled GERD/odynophagia revealing nonobstructing Schatzki's ring, small hiatal hernia, abnormal gastric mucosa, and normal examined duodenum.  She had a MBSS in January and September 2022 with findings of mild oral phase dysphagia and diffuse esophageal dysmotility.  September MBSS with slightly decreased bolus cohesion, piecemeal swallowing and mild vallecula and piriform residual.   Her GERD has been well controlled with Aciphex 20 mg twice daily.  Prior to last week she was taking Pepcid twice daily as well.   Patient has been feeling like she cannot swallow foods, having difficulty getting foods to the back of her throat to swallow.  Has not regurgitation no globus sensation.  She reports she has been eating turnip greens, eggs, bacon, sausage and that has been about the most of the meal that she can swallow.  Admits to early satiety and  unintentional weight loss.  Has lost about 25 pounds in 6 months.  She reports that most of her issues of swallowing is with food and pills.  Denies hematemesis, N/V.  Since last week she has not bought any protein shakes to drink in addition to her daily meals.   Past Medical History:  Diagnosis Date   Adenomatous colon polyp 2006   excised in 2006 & 2010Due surveillance 06/2013   Anxiety    Anxiety and depression    Asthma    Breast mass    right nipple bengn mass per patient   Breast tumor    Chest discomfort    Chronic back pain    Chronic constipation    Complication of anesthesia    Depression    Diverticulosis    Fibromyalgia    Gastroesophageal reflux disease    Hiatal hernia    History of benign esophageal tumor 2010   granular cell esophageal tumor (Dx 06/2008), resected via EMR 2011, due repeat EGD 02/2012   Hyperlipidemia    Hypertension    Insomnia    Migraines    PONV (postoperative nausea and vomiting)    Psychosis ([place])    Sleep apnea    Stop Bang score of 5. Pt said she was told by Dr. Merlene Houston that she had "a little bit" of sleep apena, but not bad enough to treat.   Thyroid nodule    Tumor of esophagus     Past Surgical History:  Procedure Laterality Date   ABDOMINAL HYSTERECTOMY     BIOPSY  10/14/2019   Procedure: BIOPSY;  Surgeon: Donna Dolin, MD;  [facility]: [facility];  Service: Endoscopy;;   BRAVO Anthony STUDY  12/11/2011   Procedure: BRAVO [place];  Surgeon: Donna Dolin, MD;  Location: [facility];  Service: Endoscopy;  Laterality: N/A;   BREAST EXCISIONAL BIOPSY  1990s, 2012   Left x2-sclerosing ductal papilloma-2012   CHOLECYSTECTOMY N/A 04/09/2013   Procedure: LAPAROSCOPIC CHOLECYSTECTOMY;  Surgeon: Donna So, MD;  Location: [facility];  Service: General;  Laterality: N/A;   COLONOSCOPY  06/2008, 06/2011   sigmoid tics, tubular adenoma; 2013: anal canal hemorrhoids   COLONOSCOPY  07/10/2011   Anal canal hemorrhoids likely the cause of  hematochezia in the setting of constipation; otherwise normal rectum ;submucosal  petechiae in left colon of doubtful clinical significance; otherwise, normal colon   COLONOSCOPY WITH PROPOFOL N/A 11/10/2017   cancelled in pre-op   COLONOSCOPY WITH PROPOFOL N/A 07/23/2018   Procedure: COLONOSCOPY WITH PROPOFOL;  Surgeon: Donna Dolin, MD; 1 tubular adenoma, diverticulosis in the sigmoid and descending colon, nonbleeding internal hemorrhoids.  No recommendations to repeat due to age.   ESOPHAGEAL DILATION N/A 05/08/2015   Procedure: ESOPHAGEAL DILATION;  Surgeon: Donna Dolin, MD;  Location: [facility];  Service: Endoscopy;  Laterality: N/AVenia Minks 54/56   ESOPHAGOGASTRODUODENOSCOPY  12/11/2011   AQT:[id number] Schatzki's ring; otherwise normal/Small hiatal hernia. Antral and bulbar erosions   ESOPHAGOGASTRODUODENOSCOPY (EGD) WITH PROPOFOL N/A 05/08/2015   Dr. Gala Houston: mild erosive reflux esophagitis, non-critical Schatzki's ring s/p dilation. Hiatal hernia.    ESOPHAGOGASTRODUODENOSCOPY (EGD) WITH PROPOFOL N/A 10/14/2019   Procedure: ESOPHAGOGASTRODUODENOSCOPY (EGD) WITH PROPOFOL;  Surgeon: Donna Dolin, MD;  Nonobstructing Schatzki ring, small hiatal hernia, abnormal gastric mucosa s/p biopsied, normal first and second portion of the duodenum.  Pathology with chronic inactive gastritis, no H. pylori.   EUS  08/2010   Dr [facility] with EGD. Retained food. No recurrent esophageal lesion, bx negative.   IR KYPHO THORACIC WITH BONE BIOPSY  10/17/2020   IR RADIOLOGIST EVAL & MGMT  09/12/2020   LUNG BIOPSY     negative   PARATHYROIDECTOMY  April 2017   [facility].    PARATHYROIDECTOMY     POLYPECTOMY  07/23/2018   Procedure: POLYPECTOMY;  Surgeon: Donna Dolin, MD;  [facility];  Service: Endoscopy;;  colon   RIGHT OOPHORECTOMY     benign disease   SHOULDER ARTHROSCOPY     Right; bone spurs removed   TONSILLECTOMY     TOTAL KNEE ARTHROPLASTY  2002   Right; previous arthroscopic  surgery    Prior to Admission medications   Medication Sig Start Date End Date Taking? Authorizing Provider  AIMOVIG 70 MG/ML SOAJ Inject 70 mg into the skin every 30 (thirty) days. 05/23/20  Yes [provider]  albuterol (PROVENTIL HFA;VENTOLIN HFA) 108 (90 BASE) MCG/ACT inhaler Inhale 2 puffs into the lungs every 6 (six) hours as needed for wheezing or shortness of breath.    Yes [provider]  amLODipine (NORVASC) 10 MG tablet Take 10 mg by mouth daily. 12/18/15  Yes [provider]  Aspirin-Acetaminophen-Caffeine (GOODY HEADACHE PO) Take 1 packet by mouth daily as needed (headaches/pain).   Yes [provider]  atenolol (TENORMIN) 100 MG tablet TAKE 1 TABLET BY MOUTH  DAILY 01/25/21  Yes Strader, [place] M, PA-C  atenolol (TENORMIN) 25 MG tablet TAKE 1 TABLET BY MOUTH  DAILY IN ADDITION TO 100MG   TABLET DAILY FOR A TOTAL OF  125MG  DAILY 01/25/21  Yes Strader, [place] M, PA-C  calcium carbonate (TUMS - DOSED IN MG ELEMENTAL CALCIUM) 500 MG chewable tablet Chew 1,500 mg by mouth daily as needed for indigestion or heartburn.   Yes [provider]  CALCIUM-VITAMIN D PO Take 1 tablet by mouth daily.   Yes [provider]  cetirizine (ZYRTEC) 10 MG tablet Take 10 mg by mouth daily as needed for allergies.   Yes [provider]  chlorthalidone (HYGROTON) 25 MG tablet Take 1/2 tablet by mouth daily. 11/02/19  Yes Branch, Alphonse Guild, MD  cycloSPORINE (RESTASIS) 0.05 % ophthalmic emulsion Place 1 drop into both eyes 2 (two) times daily as needed (dry eyes).   Yes [provider]  famotidine (PEPCID) 20 MG tablet Take 1 tablet (20 mg total) by mouth as needed for heartburn or indigestion. 06/21/20  Yes Erenest Rasher, PA-C  ferrous sulfate 325 (65 FE) MG tablet Take 325 mg by mouth daily with breakfast. Every other day   Yes [provider]  fluticasone (FLONASE) 50 MCG/ACT nasal spray Place 1 spray into both nostrils daily  as needed for allergies.    Yes [provider]  furosemide (LASIX) 80 MG tablet Take 1 tablet (80 mg total) by mouth every morning AND 0.5 tablets (40 mg total) every evening. 07/20/21 10/18/21 Yes Branch, Alphonse Guild, MD  gabapentin (NEURONTIN) 100 MG capsule Take 100 mg by mouth 3 (three) times daily. 100mg  threes times daily and 300mg  at bedtime   Yes [provider]  gabapentin (NEURONTIN) 300 MG capsule Take 300 mg by mouth at bedtime. 06/12/21  Yes [provider]  HYDROcodone-acetaminophen (NORCO/VICODIN) 5-325 MG tablet Take 1 tablet by mouth 2 (two) times daily as needed for moderate pain.   Yes [provider]  INGREZZA 80 MG CAPS Take 80 mg by mouth daily. 04/28/20  Yes [provider]  levothyroxine (SYNTHROID) 50 MCG tablet Take 50 mcg by mouth daily. 07/24/21  Yes [provider]  lovastatin (MEVACOR) 40 MG tablet Take 40 mg by mouth at bedtime.   Yes [provider]  Melatonin 3 MG TABS Take 9 mg by mouth at bedtime.    Yes [provider]  memantine (NAMENDA) 10 MG tablet Take 10 mg by mouth 2 (two) times daily.  10/20/17  Yes [provider]  naloxegol oxalate (MOVANTIK) 25 MG TABS tablet Take 25 mg by mouth daily.   Yes [provider]  OLANZapine (ZYPREXA) 5 MG tablet Take 5 mg by mouth at bedtime. 07/26/21  Yes [provider]  potassium chloride SA (KLOR-CON) 20 MEQ tablet Take 2 tablets (40 meq) by mouth in the morning and take 1 tablet (20 meq) by mouth in the evening 04/12/21  Yes Strader, [place] M, PA-C  RABEprazole (ACIPHEX) 20 MG tablet Take 1 tablet (20 mg total) by mouth 2 (two) times daily. 02/07/21  Yes Harper, Kristen S, PA-C  Semaglutide (RYBELSUS) 7 MG TABS Take 7 mg by mouth daily.   Yes [provider]  traZODone (DESYREL) 100 MG tablet Take 100 mg by mouth at bedtime. 07/31/17  Yes [provider]  triamcinolone cream (KENALOG) 0.1 % Apply 1 application  topically 2 (two) times daily. 08/07/21  Yes [provider]  venlafaxine XR (EFFEXOR-XR) 150 MG 24 hr capsule Take 150 mg by mouth at bedtime. Take with 75 mg to equal 225 mg at bedtime   Yes [provider]  venlafaxine XR (EFFEXOR-XR) 75 MG 24  hr capsule Take 75 mg by mouth at bedtime. Take with 150 mg to equal 225 mg at bedtime   Yes [provider]  zolpidem (AMBIEN) 10 MG tablet Take 10 mg by mouth at bedtime. 09/05/20  Yes [provider]    Current Facility-Administered Medications  Medication Dose Route Frequency Provider Last Rate Last Admin   0.9 %  sodium chloride infusion   Intravenous Continuous Heath Lark D, DO 75 mL/hr at 08/21/21 1317 New Bag at 08/21/21 1317   acetaminophen (TYLENOL) tablet 650 mg  650 mg Oral Q6H PRN Adefeso, Oladapo, DO   650 mg at 08/21/21 1607   amLODipine (NORVASC) tablet 10 mg  10 mg Oral Daily Adefeso, Oladapo, DO   10 mg at 08/21/21 0842   [START ON 08/23/2021] enoxaparin (LOVENOX) injection 40 mg  40 mg Subcutaneous Q24H Lorenda Grecco L, NP       famotidine (PEPCID) tablet 20 mg  20 mg Oral PRN Adefeso, Oladapo, DO       insulin aspart (novoLOG) injection 0-5 Units  0-5 Units Subcutaneous QHS Adefeso, Oladapo, DO       insulin aspart (novoLOG) injection 0-9 Units  0-9 Units Subcutaneous TID WC Adefeso, Oladapo, DO   1 Units at 08/21/21 1228   nystatin (MYCOSTATIN/NYSTOP) topical powder   Topical BID Heath Lark D, DO   Given at 08/21/21 1115   potassium chloride SA (KLOR-CON M) CR tablet 40 mEq  40 mEq Oral BID Heath Lark D, DO   40 mEq at 08/21/21 3154   pravastatin (PRAVACHOL) tablet 40 mg  40 mg Oral q1800 Adefeso, Oladapo, DO   40 mg at 08/21/21 1607   zolpidem (AMBIEN) tablet 5 mg  5 mg Oral QHS Adefeso, Oladapo, DO   5 mg at 08/21/21 0135    Allergies as of 08/20/2021 - Review Complete 08/20/2021  Allergen Reaction Noted   Elavil [amitriptyline] Other (See Comments) 06/03/2012   Abilify [aripiprazole]  Other (See Comments) 11/25/2012   Codeine Hives, Nausea Only, and Other (See Comments)    Latex Hives 11/21/2011   Penicillins Hives, Itching, and Other (See Comments)    Polyethylene glycol Other (See Comments) 12/26/2012   Sulfonamide derivatives Nausea And Vomiting    Cymbalta [duloxetine hcl] Rash 03/10/2013   Remeron [mirtazapine] Other (See Comments) 05/06/2012    Family History  Problem Relation Age of Onset   Stroke Father    Alcohol abuse Father    Heart attack Mother    Depression Mother    Anxiety disorder Mother    Colon cancer Paternal Aunt    Colon cancer Paternal Uncle    Dementia Maternal Uncle    ADD / ADHD Neg Hx    Bipolar disorder Neg Hx    Drug abuse Neg Hx    OCD Neg Hx    Paranoid behavior Neg Hx    Schizophrenia Neg Hx    Seizures Neg Hx    Sexual abuse Neg Hx    Physical abuse Neg Hx     Social History   Socioeconomic History   Marital status: Single    Spouse name: Not on file   Number of children: 1   Years of education: Not on file   Highest education level: Not on file  Occupational History   Occupation: disabled    Employer: RETIRED  Tobacco Use   Smoking status: Never   Smokeless tobacco: Never  Vaping Use   Vaping Use: Never used  Substance and Sexual Activity  Alcohol use: No    Alcohol/week: 0.0 standard drinks   Drug use: No   Sexual activity: Never  Other Topics Concern   Not on file  Social History Narrative   Not on file   Social Determinants of Health   Financial Resource Strain: Not on file  Food Insecurity: Not on file  Transportation Needs: Not on file  Physical Activity: Not on file  Stress: Not on file  Social Connections: Not on file  Intimate Partner Violence: Not on file     Review of Systems   Gen: Denies any fever, chills, loss of appetite, change in weight or weight loss CV: Denies chest pain, heart palpitations, syncope, edema  Resp: Denies shortness of breath with rest, cough, wheezing,  coughing up blood, and pleurisy. GI: see HPI GU : Denies urinary burning, blood in urine, urinary frequency, and urinary incontinence. MS: Denies joint pain, limitation of movement, swelling, cramps, and atrophy.  Derm: Denies rash, itching, dry skin, hives. Psych: Denies depression, anxiety, memory loss, hallucinations, and confusion. Heme: Denies bruising or bleeding Neuro:  Denies any headaches, dizziness, paresthesias, shaking  Physical Exam   Vital Signs in last 24 hours: Temp:  [98.3 F (36.8 C)-99 F (37.2 C)] 98.6 F (37 C) (02/28 1646) Pulse Rate:  [64-88] 69 (02/28 1646) Resp:  [8-18] 18 (02/28 1646) BP: (113-152)/(63-106) 120/106 (02/28 1646) SpO2:  [98 %-100 %] 100 % (02/28 1646) Weight:  [80.6 kg-81.2 kg] 80.6 kg (02/28 0828) Last BM Date : 08/17/21  General:   Alert,  Well-developed, pleasant and cooperative in NAD Head:  Normocephalic and atraumatic. Eyes:  Sclera clear, no icterus. Ears:  Normal auditory acuity. Neck:  Supple; no masses Lungs:  Clear throughout to auscultation.   No wheezes, crackles, or rhonchi. No acute distress. Heart:  Regular rate and rhythm; no murmurs, clicks, rubs, or gallops. Abdomen:  Soft, nontender and nondistended. No masses, hepatosplenomegaly or hernias noted. Normal bowel sounds, without guarding, and without rebound.   Rectal: deferred Msk:  Symmetrical without gross deformities. Normal posture. Extremities:  Without clubbing or edema. Neurologic:  Alert and  oriented x4. Skin:  Intact without significant lesions or rashes. Psych:  Alert and cooperative. Normal mood and affect.  Intake/Output from previous day: No intake/output data recorded. Intake/Output this shift: Total I/O In: 1336.3 [P.O.:240; I.V.:598.7; IV Piggyback:497.6] Out: -    Labs/Studies   Recent Labs Recent Labs    08/20/21 1132 08/20/21 1849 08/21/21 0213  WBC 5.5 7.6 6.6  HGB 15.9* 16.5* 16.1*  HCT 49.9* 51.5* 47.8*  PLT 188 229 196    BMET Recent Labs    08/20/21 1132 08/20/21 1849 08/21/21 0213  NA 137 135 135  K 2.2* 2.1* 2.4*  CL 93* 90* 94*  CO2 31 28 25   GLUCOSE 119* 102* 115*  BUN 24* 25* 23  CREATININE 1.37* 1.48* 1.26*  CALCIUM 9.7 10.2 9.7   LFT Recent Labs    08/20/21 1849 08/21/21 0213  PROT 7.9 7.1  ALBUMIN 4.6 4.2  AST 25 24  ALT 22 21  ALKPHOS 81 77  BILITOT 0.5 0.9   PT/INR No results for input(s): LABPROT, INR in the last 72 hours. Hepatitis Panel No results for input(s): HEPBSAG, HCVAB, HEPAIGM, HEPBIGM in the last 72 hours. C-Diff No results for input(s): CDIFFTOX in the last 72 hours.  Radiology/Studies No results found.   Assessment   [name] is a 76 y.o. year old female with history of Anxiety, depression, asthma,  chronic constipation, adenomatous colon polyps, diverticulosis, fibromyalgia, GERD, HTN, HLD, insomnia, migraines, and thyroid nodule who presented to the ED at the request of Donna Altes, PA due to her potassium being 2.2 on her pre-op lab work. GI consulted for dysphagia.   Dysphagia: Has had ongoing dysphagia, with primary oral symptoms.  She has denied globus sensation or regurgitation.  She has a history of Schatzki's ring on her last EGD in April 2021.  She had 2 modified barium swallows in 2022 with most recent in September which demonstrated oropharyngeal dysphagia.  She has had about a 25 pound weight loss in the last 6 months due to trouble with eating meats and several other foods.  She was encouraged to [name], [place] who saw her last week to stick to a soft moist diet taking smaller bites and alternating with sips of liquids.  And was also instructed to try 2 protein shakes daily along with 3 regular meals. Per patient request, will have EGD done tomorrow instead of outpatient on Thursday.    GERD: Continue with Aciphex 20 mg twice daily.  Should only use Pepcid 20 mg as needed.  Continue to avoid all NSAID products and Goody powders.      Plan / Recommendations    Proceed with EGD +/- dilation by Dr. Laural Golden tomorrow the risks, benefits, and alternatives have been discussed with the patient in detail. The patient states understanding and desires to proceed.  Should follow-up in the clinic in about 7 weeks post procedure as instructed at her last visit. Continue Aciphex 20 mg BID, pepcid as needed. Avoid all NSAID products, including goody powders. Will hold AM dose of Lovenox.    08/21/2021, 5:04 PM  Venetia Night, MSN, FNP-BC, AGACNP-BC [facility]

## 2021-08-21 NOTE — Progress Notes (Signed)
PROGRESS NOTE    [name]  WLN:[id number] DOB: [birth year] DOA: 08/20/2021 PCP: Celene Squibb, MD   Brief Narrative:  Per HPI: [name] is a 76 y.o. female with medical history significant of hypertension, hyperlipidemia, T2DM who presents to the emergency department after being referred to the ED by gastroenterologist.  Patient went for preprocedure check up with her gastroenterologist, work-up was done and she was noted to have low potassium (2.2), so she was asked to go to the ED for further evaluation and management. She complained of fatigue, but denies chest pain, shortness of breath, fever, chills, diarrhea, nausea or vomiting.  08/21/21: Patient was admitted with severe hypokalemia as well as some mild AKI and has been started on supplementation with potassium as well as some IV fluid.  Case discussed with GI with plans for likely EGD in a.m. regarding dysphagia which is a likely cause behind her hypokalemia as she has not been able to take potassium supplementation adequately or have adequate oral intake.    Assessment & Plan:   Principal Problem:   Hypokalemia Active Problems:   Mixed hyperlipidemia   Essential hypertension   GERD (gastroesophageal reflux disease)   Insomnia   Dysphagia   Obesity (BMI 30-39.9)   AKI (acute kidney injury) ([place])   Abnormal EKG   Type 2 diabetes mellitus (HCC)  Assessment and Plan: * Hypokalemia- (present on admission) K+ is 2.4 K+ will be replenished Please monitor for AM K+ for further replenishmemnt   Type 2 diabetes mellitus (HCC) Continue ISS and hypoglycemia protocol  Abnormal EKG Troponin within normal limits  Continue telemetry monitoring  AKI (acute kidney injury) ([place]) Improving with IV fluid hydration Continue to monitor in a.m.    Obesity (BMI 30-39.9) BMI 32.74; patient counseled on diet and lifestyle modification  Dysphagia Appreciate SLP evaluation Consulted GI for inpatient EGD with likely  endoscopy in a.m. Keeping n.p.o. after midnight  Insomnia Continue home Ambien  GERD (gastroesophageal reflux disease) Continue famotidine  Essential hypertension- (present on admission) Controlled, continue amlodipine  Mixed hyperlipidemia Continue statin   DVT prophylaxis: Lovenox Code Status: Full Family Communication: Discussed with sister on phone 2/28 Disposition Plan:  Status is: Inpatient Remains inpatient appropriate because: Need for IV medications.   Consultants:  GI  Procedures:  See below  Antimicrobials:  None   Subjective: Patient seen and evaluated today with no new acute complaints or concerns. No acute concerns or events noted overnight.  Objective: Vitals:   08/21/21 0540 08/21/21 0730 08/21/21 0828 08/21/21 1200  BP: 113/74 127/80 (!) 152/84 117/63  Pulse: 70 80 70 64  Resp: 14 14 18 16   Temp: 99 F (37.2 C)  99 F (37.2 C) 98.3 F (36.8 C)  TempSrc: Oral  Oral Oral  SpO2: 100% 100% 100% 100%  Weight:   80.6 kg   Height:   5\' 2"  (1.575 m)     Intake/Output Summary (Last 24 hours) at 08/21/2021 1223 Last data filed at 08/21/2021 0818 Gross per 24 hour  Intake 240 ml  Output --  Net 240 ml   Filed Weights   08/20/21 1755 08/21/21 0828  Weight: 81.2 kg 80.6 kg    Examination:  General exam: Appears calm and comfortable  Respiratory system: Clear to auscultation. Respiratory effort normal. Cardiovascular system: S1 & S2 heard, RRR.  Gastrointestinal system: Abdomen is soft Central nervous system: Alert and awake Extremities: No edema Skin: No significant lesions noted Psychiatry: Flat affect.    Data  Reviewed: I have personally reviewed following labs and imaging studies  CBC: Recent Labs  Lab 08/20/21 1132 08/20/21 1849 08/21/21 0213  WBC 5.5 7.6 6.6  NEUTROABS 3.6 4.2  --   HGB 15.9* 16.5* 16.1*  HCT 49.9* 51.5* 47.8*  MCV 84.9 84.3 84.2  PLT 188 229 147   Basic Metabolic Panel: Recent Labs  Lab  08/20/21 1132 08/20/21 1849 08/21/21 0213  NA 137 135 135  K 2.2* 2.1* 2.4*  CL 93* 90* 94*  CO2 31 28 25   GLUCOSE 119* 102* 115*  BUN 24* 25* 23  CREATININE 1.37* 1.48* 1.26*  CALCIUM 9.7 10.2 9.7  MG  --  2.2 2.0  PHOS  --   --  1.6*   GFR: Estimated Creatinine Clearance: 37.9 mL/min (A) (by C-G formula based on SCr of 1.26 mg/dL (H)). Liver Function Tests: Recent Labs  Lab 08/20/21 1849 08/21/21 0213  AST 25 24  ALT 22 21  ALKPHOS 81 77  BILITOT 0.5 0.9  PROT 7.9 7.1  ALBUMIN 4.6 4.2   No results for input(s): LIPASE, AMYLASE in the last 168 hours. No results for input(s): AMMONIA in the last 168 hours. Coagulation Profile: No results for input(s): INR, PROTIME in the last 168 hours. Cardiac Enzymes: No results for input(s): CKTOTAL, CKMB, CKMBINDEX, TROPONINI in the last 168 hours. BNP (last 3 results) No results for input(s): PROBNP in the last 8760 hours. HbA1C: Recent Labs    08/21/21 0213  HGBA1C 5.6   CBG: Recent Labs  Lab 08/21/21 0222 08/21/21 0729 08/21/21 1107  GLUCAP 134* 122* 136*   Lipid Profile: No results for input(s): CHOL, HDL, LDLCALC, TRIG, CHOLHDL, LDLDIRECT in the last 72 hours. Thyroid Function Tests: No results for input(s): TSH, T4TOTAL, FREET4, T3FREE, THYROIDAB in the last 72 hours. Anemia Panel: No results for input(s): VITAMINB12, FOLATE, FERRITIN, TIBC, IRON, RETICCTPCT in the last 72 hours. Sepsis Labs: No results for input(s): PROCALCITON, LATICACIDVEN in the last 168 hours.  Recent Results (from the past 240 hour(s))  Resp Panel by RT-PCR (Flu A&B, Covid) Nasopharyngeal Swab     Status: None   Collection Time: 08/20/21  7:49 PM   Specimen: Nasopharyngeal Swab; Nasopharyngeal(NP) swabs in vial transport medium  Result Value Ref Range Status   SARS Coronavirus 2 by RT PCR NEGATIVE NEGATIVE Final    Comment: (NOTE) SARS-CoV-2 target nucleic acids are NOT DETECTED.  The SARS-CoV-2 RNA is generally detectable in upper  respiratory specimens during the acute phase of infection. The lowest concentration of SARS-CoV-2 viral copies this assay can detect is 138 copies/mL. A negative result does not preclude SARS-Cov-2 infection and should not be used as the sole basis for treatment or other patient management decisions. A negative result may occur with  improper specimen collection/handling, submission of specimen other than nasopharyngeal swab, presence of viral mutation(s) within the areas targeted by this assay, and inadequate number of viral copies(<138 copies/mL). A negative result must be combined with clinical observations, patient history, and epidemiological information. The expected result is Negative.  Fact Sheet for Patients:  EntrepreneurPulse.com.au  Fact Sheet for Healthcare Providers:  IncredibleEmployment.be  This test is no t yet approved or cleared by the [place] FDA and  has been authorized for detection and/or diagnosis of SARS-CoV-2 by FDA under an Emergency Use Authorization (EUA). This EUA will remain  in effect (meaning this test can be used) for the duration of the COVID-19 declaration under Section 564(b)(1) of the Act, 21 U.S.C.section 360bbb-3(b)(1),  unless the authorization is terminated  or revoked sooner.       Influenza A by PCR NEGATIVE NEGATIVE Final   Influenza B by PCR NEGATIVE NEGATIVE Final    Comment: (NOTE) The Xpert Xpress SARS-CoV-2/FLU/RSV plus assay is intended as an aid in the diagnosis of influenza from Nasopharyngeal swab specimens and should not be used as a sole basis for treatment. Nasal washings and aspirates are unacceptable for Xpert Xpress SARS-CoV-2/FLU/RSV testing.  Fact Sheet for Patients: EntrepreneurPulse.com.au  Fact Sheet for Healthcare Providers: IncredibleEmployment.be  This test is not yet approved or cleared by the [place] FDA and has been  authorized for detection and/or diagnosis of SARS-CoV-2 by FDA under an Emergency Use Authorization (EUA). This EUA will remain in effect (meaning this test can be used) for the duration of the COVID-19 declaration under Section 564(b)(1) of the Act, 21 U.S.C. section 360bbb-3(b)(1), unless the authorization is terminated or revoked.  Performed at [facility], [address], [place], [place] [address]          Radiology Studies: No results found.      Scheduled Meds:  amLODipine  10 mg Oral Daily   enoxaparin (LOVENOX) injection  40 mg Subcutaneous Q24H   insulin aspart  0-5 Units Subcutaneous QHS   insulin aspart  0-9 Units Subcutaneous TID WC   nystatin   Topical BID   potassium chloride SA  40 mEq Oral BID   pravastatin  40 mg Oral q1800   zolpidem  5 mg Oral QHS   Continuous Infusions:  potassium chloride 10 mEq (08/21/21 1218)     LOS: 0 days    Time spent: 35 minutes    Eliannah Hinde Darleen Crocker, DO [facility]  If 7PM-7AM, please contact night-coverage [email or web address] 08/21/2021, 12:23 PM

## 2021-08-21 NOTE — Assessment & Plan Note (Addendum)
-  Overall stable and rising -Continue home antihypertensive regimen (except for chlorthalidone). -Patient will follow-up with PCP to reassess blood pressure and further adjust antihypertensive regimen as required.

## 2021-08-22 ENCOUNTER — Encounter (HOSPITAL_COMMUNITY)
Admission: EM | Disposition: A | Discharge status: Home / Self Care | Payer: Self-pay | Source: Home / Self Care | Attending: Internal Medicine

## 2021-08-22 ENCOUNTER — Encounter (HOSPITAL_COMMUNITY): Payer: Self-pay | Admitting: Anesthesiology

## 2021-08-22 DIAGNOSIS — R1310 Dysphagia, unspecified: Secondary | ICD-10-CM | POA: Diagnosis not present

## 2021-08-22 DIAGNOSIS — I10 Essential (primary) hypertension: Secondary | ICD-10-CM | POA: Diagnosis not present

## 2021-08-22 DIAGNOSIS — E876 Hypokalemia: Secondary | ICD-10-CM | POA: Diagnosis not present

## 2021-08-22 DIAGNOSIS — N179 Acute kidney failure, unspecified: Secondary | ICD-10-CM | POA: Diagnosis not present

## 2021-08-22 DIAGNOSIS — K219 Gastro-esophageal reflux disease without esophagitis: Secondary | ICD-10-CM | POA: Diagnosis not present

## 2021-08-22 LAB — CBC
HCT: 44.3 % (ref 36.0–46.0)
Hemoglobin: 14.3 g/dL (ref 12.0–15.0)
MCH: 26.9 pg (ref 26.0–34.0)
MCHC: 32.3 g/dL (ref 30.0–36.0)
MCV: 83.3 fL (ref 80.0–100.0)
Platelets: 163 10*3/uL (ref 150–400)
RBC: 5.32 MIL/uL — ABNORMAL HIGH (ref 3.87–5.11)
RDW: 14.6 % (ref 11.5–15.5)
WBC: 6.8 10*3/uL (ref 4.0–10.5)
nRBC: 0 % (ref 0.0–0.2)

## 2021-08-22 LAB — BASIC METABOLIC PANEL
Anion gap: 12 (ref 5–15)
BUN: 18 mg/dL (ref 8–23)
CO2: 22 mmol/L (ref 22–32)
Calcium: 9.2 mg/dL (ref 8.9–10.3)
Chloride: 100 mmol/L (ref 98–111)
Creatinine, Ser: 0.81 mg/dL (ref 0.44–1.00)
GFR, Estimated: >60 mL/min (ref >60)
Glucose, Bld: 115 mg/dL — ABNORMAL HIGH (ref 70–99)
Potassium: 2.5 mmol/L — CL (ref 3.5–5.1)
Sodium: 134 mmol/L — ABNORMAL LOW (ref 135–145)

## 2021-08-22 LAB — GLUCOSE, CAPILLARY
Glucose-Capillary: 120 mg/dL — ABNORMAL HIGH (ref 70–99)
Glucose-Capillary: 134 mg/dL — ABNORMAL HIGH (ref 70–99)
Glucose-Capillary: 164 mg/dL — ABNORMAL HIGH (ref 70–99)
Glucose-Capillary: 150 mg/dL — ABNORMAL HIGH (ref 70–99)

## 2021-08-22 LAB — CORTISOL-AM, BLOOD: Cortisol - AM: 15.5 ug/dL (ref 6.7–22.6)

## 2021-08-22 LAB — MAGNESIUM: Magnesium: 2.2 mg/dL (ref 1.7–2.4)

## 2021-08-22 SURGERY — ESOPHAGOGASTRODUODENOSCOPY (EGD) WITH PROPOFOL
Anesthesia: Monitor Anesthesia Care

## 2021-08-22 MED ORDER — POTASSIUM CHLORIDE 10 MEQ/100ML IV SOLN
10.0000 meq | Freq: Once | INTRAVENOUS | Status: AC
Start: 2021-08-22 — End: 2021-08-22
  Administered 2021-08-22: 10 meq via INTRAVENOUS

## 2021-08-22 MED ORDER — POTASSIUM CHLORIDE IN NACL 20-0.9 MEQ/L-% IV SOLN: INTRAVENOUS | Status: DC

## 2021-08-22 MED ORDER — POTASSIUM CHLORIDE 10 MEQ/100ML IV SOLN
10.0000 meq | INTRAVENOUS | Status: AC
  Administered 2021-08-22 (×2): 10 meq via INTRAVENOUS
  Filled 2021-08-22 (×3): qty 100

## 2021-08-22 MED ORDER — SODIUM CHLORIDE 0.9 % IV SOLN: INTRAVENOUS | Status: DC

## 2021-08-22 MED ORDER — POTASSIUM CHLORIDE CRYS ER 20 MEQ PO TBCR
40.0000 meq | EXTENDED_RELEASE_TABLET | Freq: Three times a day (TID) | ORAL | Status: DC
  Administered 2021-08-22 – 2021-08-23 (×5): 40 meq via ORAL
  Filled 2021-08-22 (×5): qty 2

## 2021-08-22 NOTE — Progress Notes (Signed)
PROGRESS NOTE    [name]  MGQ:[id number] DOB: 04-21-1946 DOA: 08/20/2021 PCP: Celene Squibb, MD   Brief Narrative:  Per HPI: [name] is a 76 y.o. female with medical history significant of hypertension, hyperlipidemia, T2DM who presents to the emergency department after being referred to the ED by gastroenterologist.  Patient went for preprocedure check up with her gastroenterologist, work-up was done and she was noted to have low potassium (2.2), so she was asked to go to the ED for further evaluation and management. She complained of fatigue, but denies chest pain, shortness of breath, fever, chills, diarrhea, nausea or vomiting.  Assessment & Plan:   Principal Problem:   Hypokalemia Active Problems:   Mixed hyperlipidemia   Essential hypertension   GERD (gastroesophageal reflux disease)   Insomnia   Dysphagia   Obesity (BMI 30-39.9)   AKI (acute kidney injury) ([place])   Abnormal EKG   Type 2 diabetes mellitus (HCC)  Assessment and Plan: * Hypokalemia- (present on admission) -K+ is 2.5 -Continue electrolyte repletion and follow trend. -IV maintenance fluids with potassium also was initiated. -Repeat basic metabolic panel in AM. -Magnesium within normal limits.   Type 2 diabetes mellitus (HCC) -Continue ISS and hypoglycemia protocol  Abnormal EKG -Troponin within normal limits  -Continue telemetry monitoring due to ongoing hypokalemia process.  AKI (acute kidney injury) ([place]) -Resolved with fluid resuscitation -Renal function within normal limits currently -Continue to follow trend and stability..    Obesity (BMI 30-39.9) -BMI 32.74 -Low calorie diet and portion control discussed with patient.  -Obesity class I.  Dysphagia -Appreciate SLP evaluation -Consulted GI for inpatient EGD -N.p.o. after midnight -Will follow further recommendations. -Continue electrolytes stabilization prior to procedure.  Insomnia -Continue home Ambien -Sleep  hygiene discussed with patient.  GERD (gastroesophageal reflux disease) -Continue Aciphex 20 mg twice daily. -N.p.o. after midnight -Anticipating endoscopic evaluation in a.m. if electrolytes stabilize.  Essential hypertension- (present on admission) -Controlled, continue amlodipine  Mixed hyperlipidemia -Continue statin   DVT prophylaxis: Lovenox Code Status: Full Family Communication: No family at bedside. Disposition Plan:  Status is: Inpatient  Remains inpatient appropriate because: Need for IV medications and electrolyte repletion.  Patient continues to experience difficulty swallowing..   Consultants:  GI  Procedures:  See below  Antimicrobials:  None   Subjective: No overnight events.  Denies chest pain, nausea or vomiting.  Complaining of headache this morning.  Overall hydration level has improved with normalization of her creatinine at this point.  Potassium continues to be low.  Objective: Vitals:   08/22/21 0500 08/22/21 0912 08/22/21 1446 08/22/21 1700  BP:  140/85 (!) 142/72   Pulse:   77   Resp:   16   Temp:   99.5 F (37.5 C) 99.4 F (37.4 C)  TempSrc:   Oral Oral  SpO2:   100%   Weight: 80.9 kg     Height:        Intake/Output Summary (Last 24 hours) at 08/22/2021 1818 Last data filed at 08/22/2021 1525 Gross per 24 hour  Intake 750.19 ml  Output 2100 ml  Net -1349.81 ml   Filed Weights   08/20/21 1755 08/21/21 0828 08/22/21 0500  Weight: 81.2 kg 80.6 kg 80.9 kg    Examination: General exam: Alert, awake, oriented x 3; no nausea or vomiting.  No overt bleeding reported overnight.  Patient reports having some headache. Respiratory system: Clear to auscultation. Respiratory effort normal.  No requiring oxygen supplementation.  Good saturation on  room air. Cardiovascular system:RRR. No murmurs, rubs, gallops.  No JVD. Gastrointestinal system: Abdomen is nondistended, soft and nontender. No organomegaly or masses felt. Normal bowel sounds  heard. Central nervous system: Alert and oriented. No focal neurological deficits. Extremities: No cyanosis or clubbing. Skin: No petechiae. Psychiatry: Judgement and insight appear normal. Mood & affect appropriate no overnight events.     Data Reviewed: I have personally reviewed following labs and imaging studies  CBC: Recent Labs  Lab 08/20/21 1132 08/20/21 1849 08/21/21 0213 08/22/21 0508  WBC 5.5 7.6 6.6 6.8  NEUTROABS 3.6 4.2  --   --   HGB 15.9* 16.5* 16.1* 14.3  HCT 49.9* 51.5* 47.8* 44.3  MCV 84.9 84.3 84.2 83.3  PLT 188 229 196 384   Basic Metabolic Panel: Recent Labs  Lab 08/20/21 1132 08/20/21 1849 08/21/21 0213 08/22/21 0508  NA 137 135 135 134*  K 2.2* 2.1* 2.4* 2.5*  CL 93* 90* 94* 100  CO2 31 28 25 22   GLUCOSE 119* 102* 115* 115*  BUN 24* 25* 23 18  CREATININE 1.37* 1.48* 1.26* 0.81  CALCIUM 9.7 10.2 9.7 9.2  MG  --  2.2 2.0 2.2  PHOS  --   --  1.6*  --    GFR: Estimated Creatinine Clearance: 59.1 mL/min (by C-G formula based on SCr of 0.81 mg/dL).  Liver Function Tests: Recent Labs  Lab 08/20/21 1849 08/21/21 0213  AST 25 24  ALT 22 21  ALKPHOS 81 77  BILITOT 0.5 0.9  PROT 7.9 7.1  ALBUMIN 4.6 4.2    HbA1C: Recent Labs    08/21/21 0213  HGBA1C 5.6   CBG: Recent Labs  Lab 08/21/21 1609 08/21/21 2111 08/22/21 0715 08/22/21 1114 08/22/21 1652  GLUCAP 121* 125* 120* 134* 164*    Recent Results (from the past 240 hour(s))  Resp Panel by RT-PCR (Flu A&B, Covid) Nasopharyngeal Swab     Status: None   Collection Time: 08/20/21  7:49 PM   Specimen: Nasopharyngeal Swab; Nasopharyngeal(NP) swabs in vial transport medium  Result Value Ref Range Status   SARS Coronavirus 2 by RT PCR NEGATIVE NEGATIVE Final    Comment: (NOTE) SARS-CoV-2 target nucleic acids are NOT DETECTED.  The SARS-CoV-2 RNA is generally detectable in upper respiratory specimens during the acute phase of infection. The lowest concentration of SARS-CoV-2 viral  copies this assay can detect is 138 copies/mL. A negative result does not preclude SARS-Cov-2 infection and should not be used as the sole basis for treatment or other patient management decisions. A negative result may occur with  improper specimen collection/handling, submission of specimen other than nasopharyngeal swab, presence of viral mutation(s) within the areas targeted by this assay, and inadequate number of viral copies(<138 copies/mL). A negative result must be combined with clinical observations, patient history, and epidemiological information. The expected result is Negative.  Fact Sheet for Patients:  EntrepreneurPulse.com.au  Fact Sheet for Healthcare Providers:  IncredibleEmployment.be  This test is no t yet approved or cleared by the [place] FDA and  has been authorized for detection and/or diagnosis of SARS-CoV-2 by FDA under an Emergency Use Authorization (EUA). This EUA will remain  in effect (meaning this test can be used) for the duration of the COVID-19 declaration under Section 564(b)(1) of the Act, 21 U.S.C.section 360bbb-3(b)(1), unless the authorization is terminated  or revoked sooner.       Influenza A by PCR NEGATIVE NEGATIVE Final   Influenza B by PCR NEGATIVE NEGATIVE Final  Comment: (NOTE) The Xpert Xpress SARS-CoV-2/FLU/RSV plus assay is intended as an aid in the diagnosis of influenza from Nasopharyngeal swab specimens and should not be used as a sole basis for treatment. Nasal washings and aspirates are unacceptable for Xpert Xpress SARS-CoV-2/FLU/RSV testing.  Fact Sheet for Patients: EntrepreneurPulse.com.au  Fact Sheet for Healthcare Providers: IncredibleEmployment.be  This test is not yet approved or cleared by the [place] FDA and has been authorized for detection and/or diagnosis of SARS-CoV-2 by FDA under an Emergency Use Authorization (EUA). This  EUA will remain in effect (meaning this test can be used) for the duration of the COVID-19 declaration under Section 564(b)(1) of the Act, 21 U.S.C. section 360bbb-3(b)(1), unless the authorization is terminated or revoked.  Performed at [facility], [address], [place], [place] [address]     Radiology Studies: No results found.   Scheduled Meds:  amLODipine  10 mg Oral Daily   [START ON 08/23/2021] enoxaparin (LOVENOX) injection  40 mg Subcutaneous Q24H   insulin aspart  0-5 Units Subcutaneous QHS   insulin aspart  0-9 Units Subcutaneous TID WC   nystatin   Topical BID   potassium chloride SA  40 mEq Oral TID   pravastatin  40 mg Oral q1800   Ensure Max Protein  11 oz Oral BID   zolpidem  5 mg Oral QHS   Continuous Infusions:  sodium chloride 75 mL/hr at 08/22/21 1525     LOS: 1 day    [facility]  If 7PM-7AM, please contact night-coverage [email or web address] 08/22/2021, 6:18 PM

## 2021-08-22 NOTE — Progress Notes (Signed)
?Subjective: ?Patient states that she has a headache today. She denies any nausea, vomiting or diarrhea. States that she had not been eating great at home due to dysphagia. Has been eating mostly soft foods.  ? ?Objective: ?Vital signs in last 24 hours: ?Temp:  [98.3 ?F (36.8 ?C)-98.6 ?F (37 ?C)] 98.4 ?F (36.9 ?C) (02/28 2114) ?Pulse Rate:  [64-74] 74 (02/28 2114) ?Resp:  [16-19] 19 (02/28 2114) ?BP: (117-140)/(63-106) 140/85 (03/01 0912) ?SpO2:  [100 %] 100 % (02/28 2114) ?Weight:  [80.9 kg] 80.9 kg (03/01 0500) ?Last BM Date : 08/21/21 ?General:   Alert and oriented, pleasant ?Head:  Normocephalic and atraumatic. ?Eyes:  No icterus, sclera clear. Conjuctiva pink.  ?Mouth:  Without lesions, mucosa pink and moist.  ?Heart:  S1, S2 present, no murmurs noted.  ?Lungs: Clear to auscultation bilaterally, without wheezing, rales, or rhonchi.  ?Abdomen:  Bowel sounds hypoactive, soft, non-tender, non-distended. No HSM or hernias noted. No rebound or guarding. No masses appreciated  ?Msk:  Symmetrical without gross deformities. Normal posture. ?Neurologic:  Alert and  oriented x4;  grossly normal neurologically. ?Skin:  Warm and dry, intact without significant lesions.  ?Psych:  Alert and cooperative. Normal mood and affect. ? ?Intake/Output from previous day: ?02/28 0701 - 03/01 0700 ?In: 1776.3 [P.O.:680; I.V.:598.7; IV Piggyback:497.6] ?Out: 1900 [Urine:1900] ?Intake/Output this shift: ?Total I/O ?In: -  ?Out: 500 [Urine:500] ? ?Lab Results: ?Recent Labs  ?  08/20/21 ?1849 08/21/21 ?0109 08/22/21 ?3235  ?WBC 7.6 6.6 6.8  ?HGB 16.5* 16.1* 14.3  ?HCT 51.5* 47.8* 44.3  ?PLT 229 196 163  ? ?BMET ?Recent Labs  ?  08/20/21 ?1849 08/21/21 ?5732 08/22/21 ?2025  ?NA 135 135 134*  ?K 2.1* 2.4* 2.5*  ?CL 90* 94* 100  ?CO2 28 25 22   ?GLUCOSE 102* 115* 115*  ?BUN 25* 23 18  ?CREATININE 1.48* 1.26* 0.81  ?CALCIUM 10.2 9.7 9.2  ? ?LFT ?Recent Labs  ?  08/20/21 ?1849 08/21/21 ?4270  ?PROT 7.9 7.1  ?ALBUMIN 4.6 4.2  ?AST 25 24  ?ALT 22  21  ?ALKPHOS 81 77  ?BILITOT 0.5 0.9  ? ?Assessment: ?[name] is a 76 year old female with history of Anxiety, depression, asthma, chronic constipation, adenomatous colon polyps, diverticulosis, fibromyalgia, GERD, HTN, HLD, insomnia, migraines, and thyroid nodule who presented to the ED at the request of Aliene Altes, PA due to her potassium being 2.2 on her pre-op lab work. GI consulted for dysphagia.  ? ?Dysphagia: ongoing, no globus sensation or other reflux symptoms. Hx of Schatzki's ring on last EGD April 2021, 2 modified barium swallow evals in 2022 with most recent in September with findings of oropharyngeal dysphagia. Reportedly 25 lb weight loss over the past 6 months secondary to difficulty eating meats and other foods. Seen by primary GI provider last week and encouraged to increase protein shakes to 2 per day along with 3 regular soft meals. Pt initially scheduled for EGD on Thursday as outpatient, plans to proceed with this today due to transportation issues, however, pt remains significantly hypokalemic, receiving IV runs of K+, potassium will need to be >3 prior to endo procedure being done. Patient is aware of delay in endoscopic evaluation related to her ongoing low potassium.  ? ?GERD: continue with aciphex 20mg  BID, Famotidine 20mg  PRN, Avoid all NSAIDs, reflux precautions. ? ? ?Plan: ?EGD possibly tomorrow once K+ improves ?Avoid NSAIDs ?Reflux precautions ?Aciphex 20mg  BID ?Famotidine 20mg  PRN ?Advance diet today, NPO midnight in preparation of possible EGD  tomorrow ?Continue with protein shakes BID ? ? LOS: 1 day  ? ? 08/22/2021, 10:26 AM ? ?[name] L. Alver Sorrow, MSN, APRN, AGNP-C ?Adult-Gerontology Nurse Practitioner ?[facility] ? ? ?

## 2021-08-22 NOTE — Progress Notes (Signed)
Patient complained of burning at IV site while receiving IV Potassium. Changed rate to 75 ml/hr. Patient stated arm is still burning. Rate adjusted to 50 ml/hr. MD Dyann Kief made aware. Patient tolerating IV Potassium at this time with no complaints of burning or discomfort.  ?

## 2021-08-23 ENCOUNTER — Encounter (HOSPITAL_COMMUNITY)
Admission: EM | Disposition: A | Discharge status: Home / Self Care | Payer: Self-pay | Source: Home / Self Care | Attending: Internal Medicine

## 2021-08-23 ENCOUNTER — Inpatient Hospital Stay (HOSPITAL_COMMUNITY): Payer: Medicare Other | Admitting: Certified Registered"

## 2021-08-23 ENCOUNTER — Telehealth: Payer: Self-pay | Admitting: Gastroenterology

## 2021-08-23 ENCOUNTER — Encounter (HOSPITAL_COMMUNITY): Admission: RE | Payer: Self-pay | Source: Home / Self Care

## 2021-08-23 ENCOUNTER — Ambulatory Visit (HOSPITAL_COMMUNITY): Admission: RE | Admit: 2021-08-23 | Payer: Medicare Other | Source: Home / Self Care

## 2021-08-23 DIAGNOSIS — E1165 Type 2 diabetes mellitus with hyperglycemia: Secondary | ICD-10-CM

## 2021-08-23 DIAGNOSIS — K222 Esophageal obstruction: Secondary | ICD-10-CM

## 2021-08-23 DIAGNOSIS — K2100 Gastro-esophageal reflux disease with esophagitis, without bleeding: Secondary | ICD-10-CM

## 2021-08-23 DIAGNOSIS — R1310 Dysphagia, unspecified: Secondary | ICD-10-CM | POA: Diagnosis not present

## 2021-08-23 DIAGNOSIS — K2289 Other specified disease of esophagus: Secondary | ICD-10-CM

## 2021-08-23 DIAGNOSIS — K449 Diaphragmatic hernia without obstruction or gangrene: Secondary | ICD-10-CM

## 2021-08-23 DIAGNOSIS — N179 Acute kidney failure, unspecified: Secondary | ICD-10-CM | POA: Diagnosis not present

## 2021-08-23 DIAGNOSIS — I10 Essential (primary) hypertension: Secondary | ICD-10-CM | POA: Diagnosis not present

## 2021-08-23 DIAGNOSIS — E876 Hypokalemia: Secondary | ICD-10-CM | POA: Diagnosis not present

## 2021-08-23 HISTORY — PX: BALLOON DILATION: SHX5330

## 2021-08-23 HISTORY — PX: ESOPHAGEAL BRUSHING: SHX6842

## 2021-08-23 HISTORY — PX: ESOPHAGOGASTRODUODENOSCOPY (EGD) WITH PROPOFOL: SHX5813

## 2021-08-23 LAB — GLUCOSE, CAPILLARY
Glucose-Capillary: 117 mg/dL — ABNORMAL HIGH (ref 70–99)
Glucose-Capillary: 118 mg/dL — ABNORMAL HIGH (ref 70–99)
Glucose-Capillary: 118 mg/dL — ABNORMAL HIGH (ref 70–99)
Glucose-Capillary: 159 mg/dL — ABNORMAL HIGH (ref 70–99)

## 2021-08-23 LAB — KOH PREP: KOH Prep: NONE SEEN

## 2021-08-23 LAB — BASIC METABOLIC PANEL
Anion gap: 11 (ref 5–15)
BUN: 15 mg/dL (ref 8–23)
CO2: 21 mmol/L — ABNORMAL LOW (ref 22–32)
Calcium: 9.3 mg/dL (ref 8.9–10.3)
Chloride: 106 mmol/L (ref 98–111)
Creatinine, Ser: 0.76 mg/dL (ref 0.44–1.00)
GFR, Estimated: >60 mL/min (ref >60)
Glucose, Bld: 121 mg/dL — ABNORMAL HIGH (ref 70–99)
Potassium: 3.7 mmol/L (ref 3.5–5.1)
Sodium: 138 mmol/L (ref 135–145)

## 2021-08-23 LAB — MAGNESIUM: Magnesium: 2.1 mg/dL (ref 1.7–2.4)

## 2021-08-23 SURGERY — ESOPHAGOGASTRODUODENOSCOPY (EGD) WITH PROPOFOL
Anesthesia: Monitor Anesthesia Care

## 2021-08-23 SURGERY — ESOPHAGOGASTRODUODENOSCOPY (EGD) WITH PROPOFOL
Anesthesia: General

## 2021-08-23 MED ORDER — ENSURE MAX PROTEIN PO LIQD: 11.0000 [oz_av] | Freq: Two times a day (BID) | ORAL | Status: DC

## 2021-08-23 MED ORDER — PROPOFOL 10 MG/ML IV BOLUS
INTRAVENOUS | Status: DC | PRN
  Administered 2021-08-23: 50 mg via INTRAVENOUS
  Administered 2021-08-23: 90 mg via INTRAVENOUS

## 2021-08-23 MED ORDER — ACETAMINOPHEN 500 MG PO TABS: 1000.0000 mg | ORAL_TABLET | Freq: Three times a day (TID) | ORAL | Status: DC | PRN

## 2021-08-23 MED ORDER — LACTATED RINGERS IV SOLN: INTRAVENOUS | Status: DC | PRN

## 2021-08-23 MED ORDER — LIDOCAINE HCL (CARDIAC) PF 100 MG/5ML IV SOSY
PREFILLED_SYRINGE | INTRAVENOUS | Status: DC | PRN
  Administered 2021-08-23: 50 mg via INTRAVENOUS

## 2021-08-23 MED ORDER — FUROSEMIDE 40 MG PO TABS: 40.0000 mg | ORAL_TABLET | Freq: Every day | ORAL | 2 refills | Status: DC

## 2021-08-23 MED ORDER — PHENYLEPHRINE 40 MCG/ML (10ML) SYRINGE FOR IV PUSH (FOR BLOOD PRESSURE SUPPORT)
PREFILLED_SYRINGE | INTRAVENOUS | Status: AC
  Filled 2021-08-23: qty 10

## 2021-08-23 MED ORDER — PANTOPRAZOLE SODIUM 40 MG PO TBEC: 40.0000 mg | DELAYED_RELEASE_TABLET | Freq: Two times a day (BID) | ORAL | 2 refills | Status: DC

## 2021-08-23 MED ORDER — PHENYLEPHRINE 40 MCG/ML (10ML) SYRINGE FOR IV PUSH (FOR BLOOD PRESSURE SUPPORT)
PREFILLED_SYRINGE | INTRAVENOUS | Status: DC | PRN
Start: 2021-08-23 — End: 2021-08-23
  Administered 2021-08-23: 80 ug via INTRAVENOUS

## 2021-08-23 MED ORDER — FAMOTIDINE 20 MG PO TABS: 40.0000 mg | ORAL_TABLET | Freq: Every day | ORAL | 3 refills | Status: DC

## 2021-08-23 NOTE — Anesthesia Postprocedure Evaluation (Signed)
Anesthesia Post Note ? ?Patient: [name] ? ?Procedure(s) Performed: ESOPHAGOGASTRODUODENOSCOPY (EGD) with or without dilation WITH PROPOFOL ?BALLOON DILATION ?ESOPHAGEAL BRUSHING ? ?Patient location during evaluation: Phase II ?Anesthesia Type: General ?Level of consciousness: awake and alert and oriented ?Pain management: pain level controlled ?Vital Signs Assessment: post-procedure vital signs reviewed and stable ?Respiratory status: spontaneous breathing, nonlabored ventilation and respiratory function stable ?Cardiovascular status: blood pressure returned to baseline and stable ?Postop Assessment: no apparent nausea or vomiting ?Anesthetic complications: no ? ? ?No notable events documented. ? ? ?Last Vitals:  ?Vitals:  ? 08/23/21 1145 08/23/21 1200  ?BP: 130/75 (!) 143/83  ?Pulse: 88 85  ?Resp: 17 10  ?Temp: 36.7 ?C   ?SpO2: 100% 100%  ?  ?Last Pain:  ?Vitals:  ? 08/23/21 1145  ?TempSrc:   ?PainSc: 3   ? ? ?  ?  ?  ?  ?  ?  ? ?Karlin Binion C Nithya Meriweather ? ? ? ? ?

## 2021-08-23 NOTE — Telephone Encounter (Signed)
Can we please arrange for this patient to have a hospital/post procedure follow up with [facility].  ? ?-Venetia Night, NP ?

## 2021-08-23 NOTE — Interval H&P Note (Signed)
History and Physical Interval Note: ? ?08/23/2021 ?11:26 AM ? ?[name]  has presented today for surgery, with the diagnosis of dysphagia.  The various methods of treatment have been discussed with the patient and family. After consideration of risks, benefits and other options for treatment, the patient has consented to  Procedure(s): ?ESOPHAGOGASTRODUODENOSCOPY (EGD) with or without dilation WITH PROPOFOL (N/A) as a surgical intervention.  The patient's history has been reviewed, patient examined, no change in status, stable for surgery.  I have reviewed the patient's chart and labs.  Questions were answered to the patient's satisfaction.   ? ? ?Eloise Harman ? ? ?

## 2021-08-23 NOTE — Anesthesia Procedure Notes (Signed)
Date/Time: 08/23/2021 11:38 AM ?Performed by: Orlie Dakin, CRNA ?Pre-anesthesia Checklist: Patient identified, Emergency Drugs available, Patient being monitored and Suction available ?Patient Re-evaluated:Patient Re-evaluated prior to induction ?Oxygen Delivery Method: Nasal cannula ?Induction Type: IV induction ?Placement Confirmation: positive ETCO2 ? ? ? ? ?

## 2021-08-23 NOTE — Progress Notes (Signed)
Patient discharged home today, transported home by family. Discharge paperwork went over with patient and daughter, both verbalized understanding. Belongings sent home with patient.  

## 2021-08-23 NOTE — Telephone Encounter (Signed)
Routing to Stacey to schedule OV. 

## 2021-08-23 NOTE — Progress Notes (Signed)
SLP Cancellation Note ? ?Patient Details ?Name: [name] ?MRN: [id number] ?DOB: [birth year] ? ? ?Cancelled treatment:       Reason Eval/Treat Not Completed: Patient at procedure or test/unavailable (Pt at procedure for EGD.) ? ?Thank you, ? ?Genene Churn, [place] ?[phone] ? ?Panayiota Larkin ?08/23/2021, 12:16 PM ?

## 2021-08-23 NOTE — Op Note (Signed)
[facility] ?Patient Name: [name] ?Procedure Date: 08/23/2021 11:22 AM ?MRN: [id number] ?Date of Birth: [birth year] ?Attending MD: Elon Alas. Abbey Chatters , DO ?CSN: [id number] ?Age: 76 ?Admit Type: Inpatient ?Procedure:                Upper GI endoscopy ?Indications:              Dysphagia ?Providers:                Elon Alas. Abbey Chatters, DO, Hughie Closs RN, RN, Manuela Schwartz  ?                          Cheryln Manly, Technician ?Referring MD:              ?Medicines:                See the Anesthesia note for documentation of the  ?                          administered medications ?Complications:            No immediate complications. ?Estimated Blood Loss:     Estimated blood loss was minimal. ?Procedure:                Pre-Anesthesia Assessment: ?                          - The anesthesia plan was to use monitored  ?                          anesthesia care (MAC). ?                          After obtaining informed consent, the endoscope was  ?                          passed under direct vision. Throughout the  ?                          procedure, the patient's blood pressure, pulse, and  ?                          oxygen saturations were monitored continuously. The  ?                          GIF-H190 ([id number]) scope was introduced through the  ?                          mouth, and advanced to the second part of duodenum.  ?                          The upper GI endoscopy was accomplished without  ?                          difficulty. The patient tolerated the procedure  ?                          well. ?Scope In: 11:33:57  AM ?Scope Out: 11:39:48 AM ?Total Procedure Duration: 0 hours 5 minutes 51 seconds  ?Findings: ?     Mucosal changes characterized by white specks were found in the mid  ?     esophagus. Cells for cytology were obtained by brushing. ?     A medium-sized hiatal hernia was present. ?     LA Grade A (one or more mucosal breaks less than 5 mm, not extending  ?     between tops of 2 mucosal  folds) esophagitis with no bleeding was found  ?     at the gastroesophageal junction. ?     A mild Schatzki ring was found in the distal esophagus. A TTS dilator  ?     was passed through the scope. Dilation with an 18-19-20 mm balloon  ?     dilator was performed to 20 mm. The dilation site was examined and  ?     showed moderate improvement in luminal narrowing. ?     The entire examined stomach was normal. ?     The duodenal bulb, first portion of the duodenum and second portion of  ?     the duodenum were normal. ?Impression:               - White specked mucosa in the esophagus. Cells for  ?                          cytology obtained. ?                          - Medium-sized hiatal hernia. ?                          - LA Grade A reflux esophagitis with no bleeding. ?                          - Mild Schatzki ring. Dilated. ?                          - Normal stomach. ?                          - Normal duodenal bulb, first portion of the  ?                          duodenum and second portion of the duodenum. ?Moderate Sedation: ?     Per Anesthesia Care ?Recommendation:           - Return patient to hospital ward for ongoing care. ?                          - Soft diet. ?                          - Use a proton pump inhibitor PO BID. ?                          - Will treat for candidal esophagitis if cytology  ?  positive. ?Procedure Code(s):        --- Professional --- ?                          [phone], Esophagogastroduodenoscopy, flexible,  ?                          transoral; with transendoscopic balloon dilation of  ?                          esophagus (less than 30 mm diameter) ?Diagnosis Code(s):        --- Professional --- ?                          K22.8, Other specified diseases of esophagus ?                          K44.9, Diaphragmatic hernia without obstruction or  ?                          gangrene ?                          K21.00, Gastro-esophageal reflux disease with   ?                          esophagitis, without bleeding ?                          K22.2, Esophageal obstruction ?                          R13.10, Dysphagia, unspecified ?CPT copyright 2019 American Medical Association. All rights reserved. ?The codes documented in this report are preliminary and upon coder review may  ?be revised to meet current compliance requirements. ?Elon Alas. Abbey Chatters, DO ?Elon Alas. [place], DO ?08/23/2021 11:45:57 AM ?This report has been signed electronically. ?Number of Addenda: 0 ?

## 2021-08-23 NOTE — Transfer of Care (Signed)
Immediate Anesthesia Transfer of Care Note ? ?Patient: [name] ? ?Procedure(s) Performed: ESOPHAGOGASTRODUODENOSCOPY (EGD) with or without dilation WITH PROPOFOL ?BALLOON DILATION ?ESOPHAGEAL BRUSHING ? ?Patient Location: PACU ? ?Anesthesia Type:General ? ?Level of Consciousness: drowsy ? ?Airway & Oxygen Therapy: Patient Spontanous Breathing and Patient connected to nasal cannula oxygen ? ?Post-op Assessment: Report given to RN and Post -op Vital signs reviewed and stable ? ?Post vital signs: Reviewed and stable ? ?Last Vitals:  ?Vitals Value Taken Time  ?BP    ?Temp    ?Pulse    ?Resp    ?SpO2    ? ? ?Last Pain:  ?Vitals:  ? 08/23/21 1129  ?TempSrc:   ?PainSc: 8   ?   ? ?Patients Stated Pain Goal: 6 (08/23/21 1014) ? ?Complications: No notable events documented. ?

## 2021-08-23 NOTE — Anesthesia Preprocedure Evaluation (Addendum)
Anesthesia Evaluation  ?Patient identified by MRN, date of birth, ID band ?Patient awake ? ? ? ?Reviewed: ?Allergy & Precautions, NPO status , Patient's Chart, lab work & pertinent test results, reviewed documented beta blocker date and time  ? ?History of Anesthesia Complications ?(+) PONV and history of anesthetic complications ? ?Airway ?Mallampati: II ? ?TM Distance: >3 FB ?Neck ROM: Full ? ? ? Dental ? ?(+) Dental Advisory Given, Edentulous Upper, Missing ?  ?Pulmonary ?asthma , sleep apnea , neg COPD,  COPD inhaler,  ?  ?Pulmonary exam normal ?breath sounds clear to auscultation ? ? ? ? ? ? Cardiovascular ?hypertension, Pt. on medications and Pt. on home beta blockers ?Normal cardiovascular exam ?Rhythm:Regular Rate:Normal ? ? ?  ?Neuro/Psych ? Headaches, PSYCHIATRIC DISORDERS Anxiety Depression  Neuromuscular disease   ? GI/Hepatic ?hiatal hernia, GERD  Medicated and Controlled,(+)  ?  ? substance abuse ? ,   ?Endo/Other  ?diabetes, Well Controlled, Type 2, Oral Hypoglycemic AgentsHypothyroidism  ? Renal/GU ?Renal InsufficiencyRenal disease  ?negative genitourinary ?  ?Musculoskeletal ? ?(+) Arthritis , Osteoarthritis,  Fibromyalgia - ? Abdominal ?  ?Peds ?negative pediatric ROS ?(+)  Hematology ?negative hematology ROS ?(+)   ?Anesthesia Other Findings ? ? Reproductive/Obstetrics ?negative OB ROS ? ?  ? ? ? ? ? ? ? ? ? ? ? ? ? ?  ?  ? ? ? ? ? ? ? ?Anesthesia Physical ?Anesthesia Plan ? ?ASA: 3 ? ?Anesthesia Plan: General  ? ?Post-op Pain Management: Minimal or no pain anticipated  ? ?Induction: Intravenous ? ?PONV Risk Score and Plan: Propofol infusion ? ?Airway Management Planned: Nasal Cannula and Natural Airway ? ?Additional Equipment:  ? ?Intra-op Plan:  ? ?Post-operative Plan:  ? ?Informed Consent: I have reviewed the patients History and Physical, chart, labs and discussed the procedure including the risks, benefits and alternatives for the proposed anesthesia with the  patient or authorized representative who has indicated his/her understanding and acceptance.  ? ? ? ? ? ?Plan Discussed with: CRNA and Surgeon ? ?Anesthesia Plan Comments:   ? ? ? ? ? ? ?Anesthesia Quick Evaluation ? ?

## 2021-08-23 NOTE — Discharge Summary (Signed)
Physician Discharge Summary   Patient: [name] MRN: [id number] DOB: Dec 08, 1945  Admit date:     08/20/2021  Discharge date: 08/23/21  Discharge Physician: Barton Dubois   PCP: Celene Squibb, MD   Recommendations at discharge:  Repeat basic metabolic panel to follow electrolytes and renal function Reassess blood pressure and further adjust antihypertensive regimen as needed Make sure patient has follow-up with gastroenterology service as instructed   Discharge Diagnoses: Principal Problem:   Hypokalemia Active Problems:   Mixed hyperlipidemia   Essential hypertension   GERD (gastroesophageal reflux disease)   Insomnia   Dysphagia   Obesity (BMI 30-39.9)   AKI (acute kidney injury) ([place])   Abnormal EKG   Type 2 diabetes mellitus [facility])   Hospital Course: Per HPI: [name] is a 76 y.o. female with medical history significant of hypertension, hyperlipidemia, T2DM who presents to the emergency department after being referred to the ED by gastroenterologist.  Patient went for preprocedure check up with her gastroenterologist, work-up was done and she was noted to have low potassium (2.2), so she was asked to go to the ED for further evaluation and management. She complained of fatigue, but denies chest pain, shortness of breath, fever, chills, diarrhea, nausea or vomiting.  08/21/21: Patient was admitted with severe hypokalemia as well as some mild AKI and has been started on supplementation with potassium as well as some IV fluid.  Case discussed with GI with plans for likely EGD in a.m. regarding dysphagia which is a likely cause behind her hypokalemia as she has not been able to take potassium supplementation adequately or have adequate oral intake.  Assessment and Plan: * Hypokalemia -K repleted at time of discharge -Patient will continue home supplementation and/maintenance therapy -Advised to maintain adequate hydration and nutrition. -Repeat basic metabolic panel  follow-up visit to assess electrolytes trend and instability.    Type 2 diabetes mellitus ([place]) -Resume home hypoglycemic regimen -Modified carbohydrate diet has been encouraged. -Continue outpatient follow-up with PCP.  Abnormal EKG -Troponin within normal limits  -In the setting of electrolyte abnormality -Resolved after electrolytes repleted. -No chest pain or shortness of breath at discharge.  AKI (acute kidney injury) ([place]) -Resolved with fluid resuscitation -Renal function within normal limits at time of discharge. -Safe to resume home diuretics regimen at adjusted dose -Please repeat basic metabolic panel follow-up visit to assess trend/stability of right renal function.    Obesity (BMI 30-39.9) -BMI 32.74 -Low calorie diet and portion control discussed with patient.  -Patient qualify for obesity class I.  Dysphagia -Appreciate SLP evaluation -GI has been consulted in the having findings of reflux esophagitis and Schatzki ring; last 1 was appropriately dilated. -Patient has been instructed to follow soft diet, to maintain adequate hydration and to take acid reflux medications as prescribed. -Outpatient follow-up with GI service will be arranged.   Insomnia -Continue home Ambien -Sleep hygiene discussed with patient.  GERD (gastroesophageal reflux disease) -Discharge on Protonix twice a day and nightly famotidine. -Lifestyle changes modifications and diet of choice discussed with patient. -Patient has been instructed to maintain adequate hydration -Outpatient follow-up with gastroenterology service will be arranged. -Based on endoscopy evaluation there is the possibility of Candida esophagitis; cultures has been taken and if positive GI service will provide/prescribe adequate treatment for her.  Essential hypertension -Overall stable and rising -Continue home antihypertensive regimen (except for chlorthalidone). -Patient will follow-up with PCP to reassess blood  pressure and further adjust antihypertensive regimen as required.  Mixed hyperlipidemia -  Continue statin   Consultants: Gastroenterology service Procedures performed: See below for x-ray report; EGD (refer to procedure dictation for details on endoscopy evaluation). Disposition: Home Diet recommendation: Heart healthy modified carbohydrate diet (soft consistency recommended at discharge).  DISCHARGE MEDICATION: Allergies as of 08/23/2021       Reactions   Elavil [amitriptyline] Other (See Comments)   Felt really nervous and felt like something was hold her feet or arm and/ or AM headache on it and back on it and went away off it.    Abilify [aripiprazole] Other (See Comments)   Dystonic reaction   Codeine Hives, Nausea Only, Other (See Comments)   Feels funny   Latex Hives   Penicillins Hives, Itching, Other (See Comments)   Has patient had a PCN reaction causing immediate rash, facial/tongue/throat swelling, SOB or lightheadedness with hypotension: Yes Has patient had a PCN reaction causing severe rash involving mucus membranes or skin necrosis: No Has patient had a PCN reaction that required hospitalization No Has patient had a PCN reaction occurring within the last 10 years: No If all of the above answers are "NO", then may proceed with Cephalosporin use.   Polyethylene Glycol Other (See Comments)   Per allergy test   Sulfonamide Derivatives Nausea And Vomiting   Cymbalta [duloxetine Hcl] Rash   Remeron [mirtazapine] Other (See Comments)   Caused lots of strange, weird, crazy dreams.        Medication List     STOP taking these medications    calcium carbonate 500 MG chewable tablet Commonly known as: TUMS - dosed in mg elemental calcium   CALCIUM-VITAMIN D PO   chlorthalidone 25 MG tablet Commonly known as: HYGROTON   GOODY HEADACHE PO   HYDROcodone-acetaminophen 5-325 MG tablet Commonly known as: NORCO/VICODIN   RABEprazole 20 MG tablet Commonly known as:  ACIPHEX   traZODone 100 MG tablet Commonly known as: DESYREL       TAKE these medications    acetaminophen 500 MG tablet Commonly known as: TYLENOL Take 2 tablets (1,000 mg total) by mouth every 8 (eight) hours as needed for mild pain, moderate pain or fever.   Aimovig 70 MG/ML Soaj Generic drug: Erenumab-aooe Inject 70 mg into the skin every 30 (thirty) days.   albuterol 108 (90 Base) MCG/ACT inhaler Commonly known as: VENTOLIN HFA Inhale 2 puffs into the lungs every 6 (six) hours as needed for wheezing or shortness of breath.   amLODipine 10 MG tablet Commonly known as: NORVASC Take 10 mg by mouth daily.   atenolol 100 MG tablet Commonly known as: TENORMIN TAKE 1 TABLET BY MOUTH  DAILY What changed: Another medication with the same name was removed. Continue taking this medication, and follow the directions you see here.   cetirizine 10 MG tablet Commonly known as: ZYRTEC Take 10 mg by mouth daily as needed for allergies.   cycloSPORINE 0.05 % ophthalmic emulsion Commonly known as: RESTASIS Place 1 drop into both eyes 2 (two) times daily as needed (dry eyes).   Ensure Max Protein Liqd Take 330 mLs (11 oz total) by mouth 2 (two) times daily.   famotidine 20 MG tablet Commonly known as: Pepcid Take 2 tablets (40 mg total) by mouth at bedtime. What changed:  how much to take when to take this reasons to take this   ferrous sulfate 325 (65 FE) MG tablet Take 325 mg by mouth daily with breakfast. Every other day   fluticasone 50 MCG/ACT nasal spray Commonly known as: FLONASE  Place 1 spray into both nostrils daily as needed for allergies.   furosemide 40 MG tablet Commonly known as: LASIX Take 1 tablet (40 mg total) by mouth daily. What changed:  medication strength See the new instructions.   gabapentin 100 MG capsule Commonly known as: NEURONTIN Take 100 mg by mouth 3 (three) times daily. 100mg  threes times daily and 300mg  at bedtime   gabapentin 300  MG capsule Commonly known as: NEURONTIN Take 300 mg by mouth at bedtime.   Ingrezza 80 MG capsule Generic drug: valbenazine Take 80 mg by mouth daily.   levothyroxine 50 MCG tablet Commonly known as: SYNTHROID Take 50 mcg by mouth daily.   lovastatin 40 MG tablet Commonly known as: MEVACOR Take 40 mg by mouth at bedtime.   melatonin 3 MG Tabs tablet Take 9 mg by mouth at bedtime.   memantine 10 MG tablet Commonly known as: NAMENDA Take 10 mg by mouth 2 (two) times daily.   naloxegol oxalate 25 MG Tabs tablet Commonly known as: MOVANTIK Take 25 mg by mouth daily.   OLANZapine 5 MG tablet Commonly known as: ZYPREXA Take 5 mg by mouth at bedtime.   pantoprazole 40 MG tablet Commonly known as: Protonix Take 1 tablet (40 mg total) by mouth 2 (two) times daily.   potassium chloride SA 20 MEQ tablet Commonly known as: KLOR-CON M Take 2 tablets (40 meq) by mouth in the morning and take 1 tablet (20 meq) by mouth in the evening   Rybelsus 7 MG Tabs Generic drug: Semaglutide Take 7 mg by mouth daily.   triamcinolone cream 0.1 % Commonly known as: KENALOG Apply 1 application topically 2 (two) times daily.   venlafaxine XR 75 MG 24 hr capsule Commonly known as: EFFEXOR-XR Take 75 mg by mouth at bedtime. Take with 150 mg to equal 225 mg at bedtime   venlafaxine XR 150 MG 24 hr capsule Commonly known as: EFFEXOR-XR Take 150 mg by mouth at bedtime. Take with 75 mg to equal 225 mg at bedtime   zolpidem 10 MG tablet Commonly known as: AMBIEN Take 10 mg by mouth at bedtime.        Follow-up Information     Celene Squibb, MD. Schedule an appointment as soon as possible for a visit in 10 day(s).   Specialty: Internal Medicine Contact information: [place] [facility] [phone]         Arnoldo Lenis, MD .   Specialty: Cardiology Contact information: [address] [place] [address] [phone]                  Discharge Exam: Danley Danker Weights   08/21/21 0828 08/22/21 0500 08/23/21 1014  Weight: 80.6 kg 80.9 kg 80.9 kg   General exam: Alert, awake, oriented x 3; no nausea or vomiting.  No nausea or vomiting.  Patient reports no headache currently. Respiratory system: Clear to auscultation. Respiratory effort normal.  No requiring oxygen supplementation.  Good saturation on room air. Cardiovascular system:RRR. No murmurs, rubs, gallops.  No JVD. Gastrointestinal system: Abdomen is nondistended, soft and nontender. No organomegaly or masses felt. Normal bowel sounds heard. Central nervous system: Alert and oriented. No focal neurological deficits. Extremities: No cyanosis or clubbing. Skin: No petechiae. Psychiatry: Judgement and insight appear normal. Mood & affect appropriate no overnight events.    Condition at discharge: Stable and improved.  Discharged home with instruction to follow-up with PCP and GI service.  The results  of significant diagnostics from this hospitalization (including imaging, microbiology, ancillary and laboratory) are listed below for reference.   Imaging Studies: No results found.  Microbiology: Results for orders placed or performed during the hospital encounter of 08/20/21  Resp Panel by RT-PCR (Flu A&B, Covid) Nasopharyngeal Swab     Status: None   Collection Time: 08/20/21  7:49 PM   Specimen: Nasopharyngeal Swab; Nasopharyngeal(NP) swabs in vial transport medium  Result Value Ref Range Status   SARS Coronavirus 2 by RT PCR NEGATIVE NEGATIVE Final    Comment: (NOTE) SARS-CoV-2 target nucleic acids are NOT DETECTED.  The SARS-CoV-2 RNA is generally detectable in upper respiratory specimens during the acute phase of infection. The lowest concentration of SARS-CoV-2 viral copies this assay can detect is 138 copies/mL. A negative result does not preclude SARS-Cov-2 infection and should not be used as the sole basis for treatment or other patient management  decisions. A negative result may occur with  improper specimen collection/handling, submission of specimen other than nasopharyngeal swab, presence of viral mutation(s) within the areas targeted by this assay, and inadequate number of viral copies(<138 copies/mL). A negative result must be combined with clinical observations, patient history, and epidemiological information. The expected result is Negative.  Fact Sheet for Patients:  EntrepreneurPulse.com.au  Fact Sheet for Healthcare Providers:  IncredibleEmployment.be  This test is no t yet approved or cleared by the [place] FDA and  has been authorized for detection and/or diagnosis of SARS-CoV-2 by FDA under an Emergency Use Authorization (EUA). This EUA will remain  in effect (meaning this test can be used) for the duration of the COVID-19 declaration under Section 564(b)(1) of the Act, 21 U.S.C.section 360bbb-3(b)(1), unless the authorization is terminated  or revoked sooner.       Influenza A by PCR NEGATIVE NEGATIVE Final   Influenza B by PCR NEGATIVE NEGATIVE Final    Comment: (NOTE) The Xpert Xpress SARS-CoV-2/FLU/RSV plus assay is intended as an aid in the diagnosis of influenza from Nasopharyngeal swab specimens and should not be used as a sole basis for treatment. Nasal washings and aspirates are unacceptable for Xpert Xpress SARS-CoV-2/FLU/RSV testing.  Fact Sheet for Patients: EntrepreneurPulse.com.au  Fact Sheet for Healthcare Providers: IncredibleEmployment.be  This test is not yet approved or cleared by the [place] FDA and has been authorized for detection and/or diagnosis of SARS-CoV-2 by FDA under an Emergency Use Authorization (EUA). This EUA will remain in effect (meaning this test can be used) for the duration of the COVID-19 declaration under Section 564(b)(1) of the Act, 21 U.S.C. section 360bbb-3(b)(1), unless the  authorization is terminated or revoked.  Performed at [facility], [address], [place], [place] [address]   KOH prep     Status: None   Collection Time: 08/23/21 11:35 AM   Specimen: PATH GI biopsy  Result Value Ref Range Status   Specimen Description ESOPHAGUS  Final   Special Requests NONE  Final   KOH Prep   Final    NO YEAST OR FUNGAL ELEMENTS SEEN Performed at [facility], [address] [place],  [address]    Report Status 08/23/2021 FINAL  Final    Labs: CBC: Recent Labs  Lab 08/20/21 1132 08/20/21 1849 08/21/21 0213 08/22/21 0508  WBC 5.5 7.6 6.6 6.8  NEUTROABS 3.6 4.2  --   --   HGB 15.9* 16.5* 16.1* 14.3  HCT 49.9* 51.5* 47.8* 44.3  MCV 84.9 84.3 84.2 83.3  PLT 188 229 196 163  Basic Metabolic Panel: Recent Labs  Lab 08/20/21 1132 08/20/21 1849 08/21/21 0213 08/22/21 0508 08/23/21 0518  NA 137 135 135 134* 138  K 2.2* 2.1* 2.4* 2.5* 3.7  CL 93* 90* 94* 100 106  CO2 31 28 25 22  21*  GLUCOSE 119* 102* 115* 115* 121*  BUN 24* 25* 23 18 15   CREATININE 1.37* 1.48* 1.26* 0.81 0.76  CALCIUM 9.7 10.2 9.7 9.2 9.3  MG  --  2.2 2.0 2.2 2.1  PHOS  --   --  1.6*  --   --    Liver Function Tests: Recent Labs  Lab 08/20/21 1849 08/21/21 0213  AST 25 24  ALT 22 21  ALKPHOS 81 77  BILITOT 0.5 0.9  PROT 7.9 7.1  ALBUMIN 4.6 4.2   CBG: Recent Labs  Lab 08/22/21 1652 08/22/21 2100 08/23/21 0736 08/23/21 1032 08/23/21 1244  GLUCAP 164* 150* 117* 118* 118*    Discharge time spent: greater than 30 minutes.  Signed: Barton Dubois, MD [facility] 08/23/2021

## 2021-08-26 NOTE — ED Provider Notes (Signed)
[facility] Provider Note   CSN: [id number] Arrival date & time: 08/20/21  1738     History  Chief Complaint  Patient presents with   Abnormal Labs    [name] is a 76 y.o. female.  Patient presented chief complaint of low sodium.  She states that she had routine blood work done by her doctors and her sodium was found to be low was sent to the ER.  She otherwise has complaints of generalized malaise but no chest pain no fever no vomiting or diarrhea.  She states she has chronic low sodium and takes supplements.  She states that she has not missed any of her supplements and takes them as she was told to.      Home Medications Prior to Admission medications   Medication Sig Start Date End Date Taking? Authorizing Provider  AIMOVIG 70 MG/ML SOAJ Inject 70 mg into the skin every 30 (thirty) days. 05/23/20  Yes [provider]  albuterol (PROVENTIL HFA;VENTOLIN HFA) 108 (90 BASE) MCG/ACT inhaler Inhale 2 puffs into the lungs every 6 (six) hours as needed for wheezing or shortness of breath.    Yes [provider]  amLODipine (NORVASC) 10 MG tablet Take 10 mg by mouth daily. 12/18/15  Yes [provider]  atenolol (TENORMIN) 100 MG tablet TAKE 1 TABLET BY MOUTH  DAILY 01/25/21  Yes Strader, [place] M, PA-C  cetirizine (ZYRTEC) 10 MG tablet Take 10 mg by mouth daily as needed for allergies.   Yes [provider]  cycloSPORINE (RESTASIS) 0.05 % ophthalmic emulsion Place 1 drop into both eyes 2 (two) times daily as needed (dry eyes).   Yes [provider]  ferrous sulfate 325 (65 FE) MG tablet Take 325 mg by mouth daily with breakfast. Every other day   Yes [provider]  fluticasone (FLONASE) 50 MCG/ACT nasal spray Place 1 spray into both nostrils daily as needed for allergies.    Yes [provider]  gabapentin (NEURONTIN) 100 MG capsule Take 100 mg by mouth 3 (three) times daily. '100mg'$  threes times  daily and '300mg'$  at bedtime   Yes [provider]  gabapentin (NEURONTIN) 300 MG capsule Take 300 mg by mouth at bedtime. 06/12/21  Yes [provider]  INGREZZA 80 MG CAPS Take 80 mg by mouth daily. 04/28/20  Yes [provider]  levothyroxine (SYNTHROID) 50 MCG tablet Take 50 mcg by mouth daily. 07/24/21  Yes [provider]  lovastatin (MEVACOR) 40 MG tablet Take 40 mg by mouth at bedtime.   Yes [provider]  Melatonin 3 MG TABS Take 9 mg by mouth at bedtime.    Yes [provider]  memantine (NAMENDA) 10 MG tablet Take 10 mg by mouth 2 (two) times daily.  10/20/17  Yes [provider]  naloxegol oxalate (MOVANTIK) 25 MG TABS tablet Take 25 mg by mouth daily.   Yes [provider]  OLANZapine (ZYPREXA) 5 MG tablet Take 5 mg by mouth at bedtime. 07/26/21  Yes [provider]  pantoprazole (PROTONIX) 40 MG tablet Take 1 tablet (40 mg total) by mouth 2 (two) times daily. 08/23/21 08/23/22 Yes Barton Dubois, MD  potassium chloride SA (KLOR-CON) 20 MEQ tablet Take 2 tablets (40 meq) by mouth in the morning and take 1 tablet (20 meq) by mouth in the evening 04/12/21  Yes Strader, [place] M, PA-C  Semaglutide (RYBELSUS) 7 MG TABS Take 7 mg by mouth daily.  Yes [provider]  triamcinolone cream (KENALOG) 0.1 % Apply 1 application topically 2 (two) times daily. 08/07/21  Yes [provider]  venlafaxine XR (EFFEXOR-XR) 150 MG 24 hr capsule Take 150 mg by mouth at bedtime. Take with 75 mg to equal 225 mg at bedtime   Yes [provider]  venlafaxine XR (EFFEXOR-XR) 75 MG 24 hr capsule Take 75 mg by mouth at bedtime. Take with 150 mg to equal 225 mg at bedtime   Yes [provider]  zolpidem (AMBIEN) 10 MG tablet Take 10 mg by mouth at bedtime. 09/05/20  Yes [provider]  acetaminophen (TYLENOL) 500 MG tablet Take 2 tablets (1,000 mg total) by mouth every 8 (eight) hours as needed for  mild pain, moderate pain or fever. 08/23/21   Barton Dubois, MD  Ensure Max Protein (ENSURE MAX PROTEIN) LIQD Take 330 mLs (11 oz total) by mouth 2 (two) times daily. 08/23/21   Barton Dubois, MD  famotidine (PEPCID) 20 MG tablet Take 2 tablets (40 mg total) by mouth at bedtime. 08/23/21   Barton Dubois, MD  furosemide (LASIX) 40 MG tablet Take 1 tablet (40 mg total) by mouth daily. 08/23/21 11/21/21  Barton Dubois, MD      Allergies    Elavil [amitriptyline], Abilify [aripiprazole], Codeine, Latex, Penicillins, Polyethylene glycol, Sulfonamide derivatives, Cymbalta [duloxetine hcl], and Remeron [mirtazapine]    Review of Systems   Review of Systems  Constitutional:  Negative for fever.  HENT:  Negative for ear pain.   Eyes:  Negative for pain.  Respiratory:  Negative for cough.   Cardiovascular:  Negative for chest pain.  Gastrointestinal:  Negative for abdominal pain.  Genitourinary:  Negative for flank pain.  Musculoskeletal:  Negative for back pain.  Skin:  Negative for rash.  Neurological:  Negative for headaches.   Physical Exam Updated Vital Signs BP (!) 141/82 (BP Location: Left Arm)    Pulse 80    Temp 98.3 F (36.8 C) (Oral)    Resp 18    Ht '5\' 2"'$  (1.575 m)    Wt 80.9 kg    SpO2 100%    BMI 32.62 kg/m  Physical Exam Constitutional:      General: She is not in acute distress.    Appearance: Normal appearance.  HENT:     Head: Normocephalic.     Nose: Nose normal.  Eyes:     Extraocular Movements: Extraocular movements intact.  Cardiovascular:     Rate and Rhythm: Normal rate.  Pulmonary:     Effort: Pulmonary effort is normal.  Musculoskeletal:        General: Normal range of motion.     Cervical back: Normal range of motion.  Neurological:     General: No focal deficit present.     Mental Status: She is alert. Mental status is at baseline.    ED Results / Procedures / Treatments   Labs (all labs ordered are listed, but only abnormal results are displayed) Labs  Reviewed  CBC WITH DIFFERENTIAL/PLATELET - Abnormal; Notable for the following components:      Result Value   RBC 6.11 (*)    Hemoglobin 16.5 (*)    HCT 51.5 (*)    All other components within normal limits  COMPREHENSIVE METABOLIC PANEL - Abnormal; Notable for the following components:   Potassium 2.1 (*)    Chloride 90 (*)    Glucose, Bld 102 (*)    BUN 25 (*)    Creatinine,  Ser 1.48 (*)    GFR, Estimated 37 (*)    Anion gap 17 (*)    All other components within normal limits  COMPREHENSIVE METABOLIC PANEL - Abnormal; Notable for the following components:   Potassium 2.4 (*)    Chloride 94 (*)    Glucose, Bld 115 (*)    Creatinine, Ser 1.26 (*)    GFR, Estimated 45 (*)    Anion gap 16 (*)    All other components within normal limits  CBC - Abnormal; Notable for the following components:   RBC 5.68 (*)    Hemoglobin 16.1 (*)    HCT 47.8 (*)    All other components within normal limits  PHOSPHORUS - Abnormal; Notable for the following components:   Phosphorus 1.6 (*)    All other components within normal limits  GLUCOSE, CAPILLARY - Abnormal; Notable for the following components:   Glucose-Capillary 136 (*)    All other components within normal limits  GLUCOSE, CAPILLARY - Abnormal; Notable for the following components:   Glucose-Capillary 121 (*)    All other components within normal limits  BASIC METABOLIC PANEL - Abnormal; Notable for the following components:   Sodium 134 (*)    Potassium 2.5 (*)    Glucose, Bld 115 (*)    All other components within normal limits  CBC - Abnormal; Notable for the following components:   RBC 5.32 (*)    All other components within normal limits  GLUCOSE, CAPILLARY - Abnormal; Notable for the following components:   Glucose-Capillary 125 (*)    All other components within normal limits  GLUCOSE, CAPILLARY - Abnormal; Notable for the following components:   Glucose-Capillary 120 (*)    All other components within normal limits   GLUCOSE, CAPILLARY - Abnormal; Notable for the following components:   Glucose-Capillary 134 (*)    All other components within normal limits  GLUCOSE, CAPILLARY - Abnormal; Notable for the following components:   Glucose-Capillary 164 (*)    All other components within normal limits  BASIC METABOLIC PANEL - Abnormal; Notable for the following components:   CO2 21 (*)    Glucose, Bld 121 (*)    All other components within normal limits  GLUCOSE, CAPILLARY - Abnormal; Notable for the following components:   Glucose-Capillary 150 (*)    All other components within normal limits  GLUCOSE, CAPILLARY - Abnormal; Notable for the following components:   Glucose-Capillary 117 (*)    All other components within normal limits  GLUCOSE, CAPILLARY - Abnormal; Notable for the following components:   Glucose-Capillary 118 (*)    All other components within normal limits  GLUCOSE, CAPILLARY - Abnormal; Notable for the following components:   Glucose-Capillary 118 (*)    All other components within normal limits  GLUCOSE, CAPILLARY - Abnormal; Notable for the following components:   Glucose-Capillary 159 (*)    All other components within normal limits  CBG MONITORING, ED - Abnormal; Notable for the following components:   Glucose-Capillary 122 (*)    All other components within normal limits  CBG MONITORING, ED - Abnormal; Notable for the following components:   Glucose-Capillary 134 (*)    All other components within normal limits  RESP PANEL BY RT-PCR (FLU A&B, COVID) ARPGX2  KOH PREP  MAGNESIUM  APTT  MAGNESIUM  HEMOGLOBIN A1C  MAGNESIUM  CORTISOL-AM, BLOOD  MAGNESIUM  TROPONIN I (HIGH SENSITIVITY)  TROPONIN I (HIGH SENSITIVITY)    EKG EKG Interpretation  Date/Time:  Monday August 20 2021 18:02:18 EST Ventricular Rate:  82 PR Interval:  166 QRS Duration: 76 QT Interval:  408 QTC Calculation: 476 R Axis:   -19 Text Interpretation: Normal sinus rhythm Minimal voltage  criteria for LVH, may be normal variant ( R in aVL ) ST & T wave abnormality, consider inferior ischemia ST & T wave abnormality, consider anterolateral ischemia Prolonged QT Abnormal ECG When compared with ECG of 26-Jul-2015 15:36, PREVIOUS ECG IS PRESENT Confirmed by Thamas Jaegers ([id number]) on 08/20/2021 7:42:16 PM  Radiology No results found.  Procedures .Critical Care Performed by: Luna Fuse, MD Authorized by: Luna Fuse, MD   Critical care provider statement:    Critical care time (minutes):  30   Critical care time was exclusive of:  Separately billable procedures and treating other patients and teaching time   Critical care was necessary to treat or prevent imminent or life-threatening deterioration of the following conditions:  Metabolic crisis    Medications Ordered in ED Medications  0.9 %  sodium chloride infusion (0 mLs Intravenous Stopped 08/22/21 0159)  potassium chloride 10 mEq in 100 mL IVPB (10 mEq Intravenous Not Given 08/22/21 1448)  potassium chloride SA (KLOR-CON M) CR tablet 40 mEq (40 mEq Oral Given 08/20/21 1950)  magnesium oxide (MAG-OX) tablet 400 mg (400 mg Oral Given 08/20/21 1949)  potassium chloride 10 mEq in 100 mL IVPB (0 mEq Intravenous Stopped 08/21/21 0016)  potassium chloride 10 mEq in 100 mL IVPB (10 mEq Intravenous New Bag/Given 08/21/21 1502)  potassium chloride 10 mEq in 100 mL IVPB ( Intravenous Infusion Verify 08/22/21 1525)    ED Course/ Medical Decision Making/ A&P                           Medical Decision Making Problems Addressed: Hypokalemia: acute illness or injury that poses a threat to life or bodily functions    Details: EKG shows new changes.  Peak T waves noted.  Amount and/or Complexity of Data Reviewed External Data Reviewed: notes.    Details: Chart review shows outpatient visit with gastroenterology for endoscopy. Labs: ordered.    Details: Labs show CBC and chemistry with potassium 2.1. ECG/medicine tests:  Decision-making  details documented in ED Course.    Details: Sinus rhythm, normal rate, no ST elevations.  Peak T waves noted.  Risk OTC drugs. Prescription drug management. Decision regarding hospitalization. Risk Details: Patient presents with severely low potassium.  This required IV and oral repletion.  EKG changes noted with peaked T waves.           Final Clinical Impression(s) / ED Diagnoses Final diagnoses:  Hypokalemia  Gastroesophageal reflux disease, unspecified whether esophagitis present    Rx / DC Orders ED Discharge Orders          Ordered    acetaminophen (TYLENOL) 500 MG tablet  Every 8 hours PRN        08/23/21 1609    Ensure Max Protein (ENSURE MAX PROTEIN) LIQD  2 times daily        08/23/21 1609    furosemide (LASIX) 40 MG tablet  Daily        08/23/21 1609    pantoprazole (PROTONIX) 40 MG tablet  2 times daily        08/23/21 1609    Diet - low sodium heart healthy        08/23/21 1609    Discharge instructions  Comments: -Avoid the use of NSAIDs -Take medications as prescribed -Maintain adequate hydration -Follow soft diet -Arrange follow-up with PCP in 10 days -Follow-up with gastroenterology service as instructed (office will contact you with appointment details).   08/23/21 1609    famotidine (PEPCID) 20 MG tablet  Daily at bedtime        08/23/21 1609              Luna Fuse, MD 08/26/21 [phone]

## 2021-08-27 ENCOUNTER — Encounter: Payer: Self-pay | Admitting: Internal Medicine

## 2021-08-28 ENCOUNTER — Encounter (HOSPITAL_COMMUNITY): Payer: Self-pay | Admitting: Internal Medicine

## 2021-09-04 NOTE — Progress Notes (Deleted)
? ? ?Referring Provider: Celene Squibb, MD ?Primary Care Physician:  Celene Squibb, MD ?Primary GI Physician: Dr. Abbey Chatters ? ?No chief complaint on file. ? ? ?HPI:   ?[name] is a 76 y.o. female presenting today for follow-up of constipation, GERD, dysphagia, unintentional weight loss.  ? ?Last seen in our office 08/14/2021.  Chronic constipation improving with Movantik 25 mg, but still not adequately managed.  BMs about once a week.  Recommended adding MiraLAX daily.  Chronic GERD well controlled on Aciphex 20 mg twice daily and Pepcid 20 mg twice daily.  Encouraged to continue her current medications, but decrease Pepcid to as needed rather than scheduled.  Noted ongoing dysphagia which sounded primarily oropharyngeal the patient was a difficult historian.  Known Schatzki's ring previously but since then, dysphagia thoroughly evaluated with 2 modified barium swallows and evaluation by speech therapy x2.  Also noted 25 pound unintentional weight loss over the last 6 months.  Suspected oropharyngeal dysphagia was likely a large contributor as she had significantly restricted her diet due to inability to swallow certain foods and eliminating meats completely.  She also reported new onset early satiety x3 to 4 months without abdominal pain, nausea, or vomiting.  Denies new medications aside from [place].  Has been on Rybelsus for well over a year.  She was taking Goody powders despite previously recommending against this.  Recommended EGD to further evaluate weight loss, early satiety, and dysphagia.  Also discussed dietary changes to help with dysphagia and weight maintenance. ? ?She was admitted to the hospital on 2/27 due to hypokalemia with potassium 2.2 and mild AKI.  She was treated with IV fluids and potassium. EGD 08/23/21: White speck of mucosa in esophagus s/p brushing for cytology, medium size hiatal hernia, grade a reflux esophagitis, mild Schatzki's ring in distal esophagus s/p dilation, normal  examined stomach and duodenum.  KOH prep was negative.  Speech therapy saw patient during admission as well.  Bedside evaluation with oral dysphagia with prolonged AP, uncoordinated and poor bolus cohesion, moderate oral residue after initial swallow.  With pur?es, very slow A-P transit and suspected delayed swallow trigger.  No overt aspiration.  Recommended D2/fine chop diet. ? ?Today: ? ? ? ?Past Medical History:  ?Diagnosis Date  ? Adenomatous colon polyp 2006  ? excised in 2006 & 2010Due surveillance 06/2013  ? Anxiety   ? Anxiety and depression   ? Asthma   ? Breast mass   ? right nipple bengn mass per patient  ? Breast tumor   ? Chest discomfort   ? Chronic back pain   ? Chronic constipation   ? Complication of anesthesia   ? Depression   ? Diverticulosis   ? Fibromyalgia   ? Gastroesophageal reflux disease   ? Hiatal hernia   ? History of benign esophageal tumor 2010  ? granular cell esophageal tumor (Dx 06/2008), resected via EMR 2011, due repeat EGD 02/2012  ? Hyperlipidemia   ? Hypertension   ? Insomnia   ? Migraines   ? PONV (postoperative nausea and vomiting)   ? Psychosis ([place])   ? Sleep apnea   ? Stop Bang score of 5. Pt said she was told by Dr. Merlene Laughter that she had "a little bit" of sleep apena, but not bad enough to treat.  ? Thyroid nodule   ? Tumor of esophagus   ? ? ?Past Surgical History:  ?Procedure Laterality Date  ? ABDOMINAL HYSTERECTOMY    ? BALLOON DILATION  08/23/2021  ? Procedure: BALLOON DILATION;  Surgeon: Eloise Harman, DO;  [facility];  Service: Endoscopy;;  ? BIOPSY  10/14/2019  ? Procedure: BIOPSY;  Surgeon: Daneil Dolin, MD;  [facility]: [facility];  Service: Endoscopy;;  ? BRAVO [place] STUDY  12/11/2011  ? Procedure: BRAVO [place];  Surgeon: Daneil Dolin, MD;  [facility];  Service: Endoscopy;  Laterality: N/A;  ? BREAST EXCISIONAL BIOPSY  1990s, 2012  ? Left x2-sclerosing ductal papilloma-2012  ? CHOLECYSTECTOMY N/A 04/09/2013  ? Procedure: LAPAROSCOPIC  CHOLECYSTECTOMY;  Surgeon: Jamesetta So, MD;  Location: [facility];  Service: General;  Laterality: N/A;  ? COLONOSCOPY  06/2008, 06/2011  ? sigmoid tics, tubular adenoma; 2013: anal canal hemorrhoids  ? COLONOSCOPY  07/10/2011  ? Anal canal hemorrhoids likely the cause of hematochezia in the setting of constipation; otherwise normal rectum ;submucosal  petechiae in left colon of doubtful clinical significance; otherwise, normal colon  ? COLONOSCOPY WITH PROPOFOL N/A 11/10/2017  ? cancelled in pre-op  ? COLONOSCOPY WITH PROPOFOL N/A 07/23/2018  ? Procedure: COLONOSCOPY WITH PROPOFOL;  Surgeon: Daneil Dolin, MD; 1 tubular adenoma, diverticulosis in the sigmoid and descending colon, nonbleeding internal hemorrhoids.  No recommendations to repeat due to age.  ? ESOPHAGEAL BRUSHING  08/23/2021  ? Procedure: ESOPHAGEAL BRUSHING;  Surgeon: Eloise Harman, DO;  [facility];  Service: Endoscopy;;  ? ESOPHAGEAL DILATION N/A 05/08/2015  ? Procedure: ESOPHAGEAL DILATION;  Surgeon: Daneil Dolin, MD;  Location: [facility];  Service: Endoscopy;  Laterality: N/AVenia Minks 54/56  ? ESOPHAGOGASTRODUODENOSCOPY  12/11/2011  ? [id number] Schatzki's ring; otherwise normal/Small hiatal hernia. Antral and bulbar erosions  ? ESOPHAGOGASTRODUODENOSCOPY (EGD) WITH PROPOFOL N/A 05/08/2015  ? Dr. Gala Romney: mild erosive reflux esophagitis, non-critical Schatzki's ring s/p dilation. Hiatal hernia.   ? ESOPHAGOGASTRODUODENOSCOPY (EGD) WITH PROPOFOL N/A 10/14/2019  ? Procedure: ESOPHAGOGASTRODUODENOSCOPY (EGD) WITH PROPOFOL;  Surgeon: Daneil Dolin, MD;  Nonobstructing Schatzki ring, small hiatal hernia, abnormal gastric mucosa s/p biopsied, normal first and second portion of the duodenum.  Pathology with chronic inactive gastritis, no H. pylori.  ? ESOPHAGOGASTRODUODENOSCOPY (EGD) WITH PROPOFOL N/A 08/23/2021  ? Procedure: ESOPHAGOGASTRODUODENOSCOPY (EGD) with or without dilation WITH PROPOFOL;  Surgeon: Eloise Harman, DO;  [facility]  [facility];  Service: Endoscopy;  Laterality: N/A;  ? EUS  08/2010  ? Dr [facility] with EGD. Retained food. No recurrent esophageal lesion, bx negative.  ? IR KYPHO THORACIC WITH BONE BIOPSY  10/17/2020  ? IR RADIOLOGIST EVAL & MGMT  09/12/2020  ? LUNG BIOPSY    ? negative  ? PARATHYROIDECTOMY  April 2017  ? [facility].   ? PARATHYROIDECTOMY    ? POLYPECTOMY  07/23/2018  ? Procedure: POLYPECTOMY;  Surgeon: Daneil Dolin, MD;  [facility];  Service: Endoscopy;;  colon  ? RIGHT OOPHORECTOMY    ? benign disease  ? SHOULDER ARTHROSCOPY    ? Right; bone spurs removed  ? TONSILLECTOMY    ? TOTAL KNEE ARTHROPLASTY  2002  ? Right; previous arthroscopic surgery  ? ? ?Current Outpatient Medications  ?Medication Sig Dispense Refill  ? acetaminophen (TYLENOL) 500 MG tablet Take 2 tablets (1,000 mg total) by mouth every 8 (eight) hours as needed for mild pain, moderate pain or fever.    ? AIMOVIG 70 MG/ML SOAJ Inject 70 mg into the skin every 30 (thirty) days.    ? albuterol (PROVENTIL HFA;VENTOLIN HFA) 108 (90 BASE) MCG/ACT inhaler Inhale  2 puffs into the lungs every 6 (six) hours as needed for wheezing or shortness of breath.     ? amLODipine (NORVASC) 10 MG tablet Take 10 mg by mouth daily.    ? atenolol (TENORMIN) 100 MG tablet TAKE 1 TABLET BY MOUTH  DAILY 90 tablet 3  ? cetirizine (ZYRTEC) 10 MG tablet Take 10 mg by mouth daily as needed for allergies.    ? cycloSPORINE (RESTASIS) 0.05 % ophthalmic emulsion Place 1 drop into both eyes 2 (two) times daily as needed (dry eyes).    ? Ensure Max Protein (ENSURE MAX PROTEIN) LIQD Take 330 mLs (11 oz total) by mouth 2 (two) times daily.    ? famotidine (PEPCID) 20 MG tablet Take 2 tablets (40 mg total) by mouth at bedtime. 60 tablet 3  ? ferrous sulfate 325 (65 FE) MG tablet Take 325 mg by mouth daily with breakfast. Every other day    ? fluticasone (FLONASE) 50 MCG/ACT nasal spray Place 1 spray into both nostrils daily as needed for allergies.     ? furosemide (LASIX)  40 MG tablet Take 1 tablet (40 mg total) by mouth daily. 30 tablet 2  ? gabapentin (NEURONTIN) 100 MG capsule Take 100 mg by mouth 3 (three) times daily. '100mg'$  threes times daily and '300mg'$  at bedtime    ? ga

## 2021-09-05 ENCOUNTER — Encounter: Payer: Self-pay | Admitting: Gastroenterology

## 2021-09-05 ENCOUNTER — Other Ambulatory Visit: Payer: Self-pay

## 2021-09-05 ENCOUNTER — Ambulatory Visit: Payer: Medicare Other | Admitting: Gastroenterology

## 2021-09-05 VITALS — BP 132/72 | HR 86 | Temp 97.5°F | Ht 62.0 in | Wt 174.4 lb

## 2021-09-05 DIAGNOSIS — K219 Gastro-esophageal reflux disease without esophagitis: Secondary | ICD-10-CM | POA: Diagnosis not present

## 2021-09-05 DIAGNOSIS — R1013 Epigastric pain: Secondary | ICD-10-CM

## 2021-09-05 DIAGNOSIS — R1312 Dysphagia, oropharyngeal phase: Secondary | ICD-10-CM

## 2021-09-05 DIAGNOSIS — R197 Diarrhea, unspecified: Secondary | ICD-10-CM

## 2021-09-05 MED ORDER — PANTOPRAZOLE SODIUM 40 MG PO TBEC: 40.0000 mg | DELAYED_RELEASE_TABLET | Freq: Two times a day (BID) | ORAL | 2 refills | Status: DC

## 2021-09-05 NOTE — Patient Instructions (Addendum)
As discussed I am ordering blood work for you to have done at [facility] to check and make sure you have not had a drop in anemia. ? ?Upon review of your chart history of hemorrhoids, still getting stool may be positive just because of this.  Since you are having diarrhea I do want to check your stool for infectious agents.  ? ?I want you to begin taking Protonix 40 mg twice daily.  I have sent this over to the pharmacy for you to pick up. ? ?If abdominal pain worsens or have worsening reflux symptoms on Protonix please hesitate call the office or follow-up.  I would like to see you back in about 3 months. ? ?It was a pleasure to see you today. I want to create trusting relationships with patients. If you receive a survey regarding your visit,  I greatly appreciate you taking time to fill this out on paper or through your MyChart. I value your feedback. ? ?Venetia Night, MSN, FNP-BC, AGACNP-BC ?[facility] ? ? ? ? ?

## 2021-09-05 NOTE — Progress Notes (Signed)
? ?GI Office Note   ? ?Referring Provider: Celene Squibb, MD ?Primary Care Physician:  Celene Squibb, MD ?Primary GI: Dr. Gala Romney ? ?Date:  09/06/2021  ?ID:  [name], DOB 14-Aug-1945, MRN [id number] ? ? ?Chief Complaint  ? ?Chief Complaint  ?Patient presents with  ? Abdominal Pain  ?  Diarrhea   ? ? ? ?History of Present Illness  ?[name] is a 76 y.o. female presenting today for hospital follow up, GERD, dysphagia, diarrhea, and abdominal pain. ? ?She was last seen in the office 08/14/2021.  She been having chronic constipation that was slowly improving with Movantik 25 mg.  She was only having bowel movements about once a week.  She was recommended to begin adding MiraLAX daily.  She has chronic GERD has been well controlled on Aciphex 20 mg twice daily and Pepcid 20 mg twice daily.  It was recommended for her to continue her current medications but to decrease Pepcid to take as needed rather than twice daily.  At this time she continues to note ongoing dysphagia that was consistent as being oropharyngeal in nature however she is a difficult historian.  She does have a history of Schatzki's rings that have been dilated in the past and her dysphagia has also been evaluated by speech therapy with modified barium swallow test x2.  She had noted about 25 pounds of unintentional weight loss over 6 months time.  She has significantly restricted her diet due to her inability to swallow certain foods and had limited meats completely which is believed to be a contributor to her weight loss.  She had also reported early satiety for few months but denied abdominal pain, nausea, or vomiting.  She did admit to taking Goody powders despite continuous recommendations to discontinue this it was recommended that she have an EGD to further evaluate all of her symptoms and was encouraged to supplement diet with protein shakes.  ? ?She was subsequently admitted to the hospital on 08/20/2021 due to hypokalemia with a potassium  of 2.2 and mild AKI noted on preprocedure blood work.  She was given IV fluids and potassium supplementation.  She underwent EGD on 08/23/2021 that showed a white speck of mucosa in the esophagus status post brushing for cytology for which KOH prep was negative, medium size hiatal hernia, grade a reflux esophagitis, mild Schatzki's ring in the distal esophagus status post dilation, and normal examined stomach and duodenum.  She was evaluated by speech therapy during this admission who did a bedside swallow evaluation that noted oral dysphagia with prolonged A-P, uncoordinated and poor bolus cohesion, moderate oral residue after initial swallow.  She had very little slow AP transit and suspected delayed with pur?es. ? ? ?Today:  ?Chronic GERD -she reports that she has been doing a lot of belching.  Her discharge paperwork mentioned stopping rabeprazole and starting pantoprazole, however her sister who is here with her stated that she does not believe that they ever picked up pantoprazole to take, and had not been taking the rabeprazole.  She reports she has been having excruciating epigastric pain that feels like she has been hit in the gut with a fist.  She denies any nausea or vomiting. ? ?chronic constipation: Patient and her sister report that she has not had issues with constipation since prior to hospitalization.  She actually reports that she had began having diarrhea in the hospital.  Thinks the last dose of Movantik was at some point prior to  hospitalization but unsure if actual date.  This is not recently been a problem she has been having diarrhea. ? ?Diarrhea: She reports that she started having diarrhea the night prior to to her EGD and discharge from the hospital.  After going home she was having about 5-6 loose stools a day.  Denies them being watery.  She denies any prior antibiotic use or sick contacts.  Denies nocturnal stooling.  They report the stools have been dark in color, and have not been  taking iron since prior to hospitalization.  Denies any hematochezia, fevers, chills.  She has been taking Kaopectate almost every day since discharge from the hospital and is currently only having about 2 looser stools a day.  She has reported some lightheadedness and dizziness with standing.  And has reported being more fatigued than usual. ? ?chronic dysphagia: The patient and her sister reported that she has been able to eat scrambled eggs, fish, grits, fruit, greens, cooked vegetables .she does not do well with heavier textured meats. Sometimes will have mashed sausage.  Patient reports that it does not feel that the food is getting stuck but that is slowly moving down her esophagus.  Her sister feels like the patient has to chew for long periods of time and has issues initiating her swallow. ? ?Patient and sister confirm that she has still been taking levothyroxine and has not had this level checked in quite some time. ? ?Sister noted that she had stopped taking medications or decreased doses of medications that were active on her chart that could have been causing or contributing to diarrhea, this includes her atenolol and gabapentin. ? ? ?Past Medical History:  ?Diagnosis Date  ? Adenomatous colon polyp 2006  ? excised in 2006 & 2010Due surveillance 06/2013  ? Anxiety   ? Anxiety and depression   ? Asthma   ? Breast mass   ? right nipple bengn mass per patient  ? Breast tumor   ? Chest discomfort   ? Chronic back pain   ? Chronic constipation   ? Complication of anesthesia   ? Depression   ? Diverticulosis   ? Fibromyalgia   ? Gastroesophageal reflux disease   ? Hiatal hernia   ? History of benign esophageal tumor 2010  ? granular cell esophageal tumor (Dx 06/2008), resected via EMR 2011, due repeat EGD 02/2012  ? Hyperlipidemia   ? Hypertension   ? Insomnia   ? Migraines   ? PONV (postoperative nausea and vomiting)   ? Psychosis ([place])   ? Sleep apnea   ? Stop Bang score of 5. Pt said she was told by Dr.  Merlene Laughter that she had "a little bit" of sleep apena, but not bad enough to treat.  ? Thyroid nodule   ? Tumor of esophagus   ? ? ?Past Surgical History:  ?Procedure Laterality Date  ? ABDOMINAL HYSTERECTOMY    ? BALLOON DILATION  08/23/2021  ? Procedure: BALLOON DILATION;  Surgeon: Eloise Harman, DO;  [facility];  Service: Endoscopy;;  ? BIOPSY  10/14/2019  ? Procedure: BIOPSY;  Surgeon: Daneil Dolin, MD;  [facility];  Service: Endoscopy;;  ? BRAVO [place] STUDY  12/11/2011  ? Procedure: BRAVO [place];  Surgeon: Daneil Dolin, MD;  [facility];  Service: Endoscopy;  Laterality: N/A;  ? BREAST EXCISIONAL BIOPSY  1990s, 2012  ? Left x2-sclerosing ductal papilloma-2012  ? CHOLECYSTECTOMY N/A 04/09/2013  ? Procedure: LAPAROSCOPIC CHOLECYSTECTOMY;  Surgeon: Jamesetta So, MD;  [facility]: [facility];  Service: General;  Laterality: N/A;  ? COLONOSCOPY  06/2008, 06/2011  ? sigmoid tics, tubular adenoma; 2013: anal canal hemorrhoids  ? COLONOSCOPY  07/10/2011  ? Anal canal hemorrhoids likely the cause of hematochezia in the setting of constipation; otherwise normal rectum ;submucosal  petechiae in left colon of doubtful clinical significance; otherwise, normal colon  ? COLONOSCOPY WITH PROPOFOL N/A 11/10/2017  ? cancelled in pre-op  ? COLONOSCOPY WITH PROPOFOL N/A 07/23/2018  ? Procedure: COLONOSCOPY WITH PROPOFOL;  Surgeon: Daneil Dolin, MD; 1 tubular adenoma, diverticulosis in the sigmoid and descending colon, nonbleeding internal hemorrhoids.  No recommendations to repeat due to age.  ? ESOPHAGEAL BRUSHING  08/23/2021  ? Procedure: ESOPHAGEAL BRUSHING;  Surgeon: Eloise Harman, DO;  [facility];  Service: Endoscopy;;  ? ESOPHAGEAL DILATION N/A 05/08/2015  ? Procedure: ESOPHAGEAL DILATION;  Surgeon: Daneil Dolin, MD;  [facility]: [facility];  Service: Endoscopy;  Laterality: N/AVenia Minks 54/56  ? ESOPHAGOGASTRODUODENOSCOPY  12/11/2011  ? [id number] Schatzki's ring; otherwise  normal/Small hiatal hernia. Antral and bulbar erosions  ? ESOPHAGOGASTRODUODENOSCOPY (EGD) WITH PROPOFOL N/A 05/08/2015  ? Dr. Gala Romney: mild erosive reflux esophagitis, non-critical Schatzki's ring s/p dilation. Hia

## 2021-09-07 ENCOUNTER — Encounter: Payer: Self-pay | Admitting: Gastroenterology

## 2021-09-07 ENCOUNTER — Telehealth: Payer: Self-pay | Admitting: Cardiology

## 2021-09-07 DIAGNOSIS — Z79899 Other long term (current) drug therapy: Secondary | ICD-10-CM

## 2021-09-07 NOTE — Telephone Encounter (Signed)
I will forward to Dr.Branch for dispo. ?

## 2021-09-07 NOTE — Telephone Encounter (Signed)
Pt c/o BP issue: STAT if pt c/o blurred vision, one-sided weakness or slurred speech ? ?1. What are your last 5 BP readings?  ?No readings available ? ?2. Are you having any other symptoms (ex. Dizziness, headache, blurred vision, passed out)?  ?No  ?3. What is your BP issue?  ? ?Patient states her BP has been elevated. Ranging around 167/111. ?

## 2021-09-09 NOTE — Telephone Encounter (Signed)
Bp too high,can she start losartan '25mg'$  daily and update [place] in 1 week. Bmet in 2 weeks please ? ? ?Zandra Abts MD ?

## 2021-09-10 ENCOUNTER — Other Ambulatory Visit: Payer: Self-pay

## 2021-09-10 DIAGNOSIS — R634 Abnormal weight loss: Secondary | ICD-10-CM

## 2021-09-10 DIAGNOSIS — R197 Diarrhea, unspecified: Secondary | ICD-10-CM

## 2021-09-10 DIAGNOSIS — R1312 Dysphagia, oropharyngeal phase: Secondary | ICD-10-CM | POA: Diagnosis not present

## 2021-09-10 DIAGNOSIS — R1013 Epigastric pain: Secondary | ICD-10-CM

## 2021-09-10 NOTE — Telephone Encounter (Signed)
Left message to return call 

## 2021-09-11 ENCOUNTER — Other Ambulatory Visit: Payer: Self-pay

## 2021-09-11 ENCOUNTER — Telehealth: Payer: Self-pay

## 2021-09-11 DIAGNOSIS — K219 Gastro-esophageal reflux disease without esophagitis: Secondary | ICD-10-CM

## 2021-09-11 DIAGNOSIS — R1312 Dysphagia, oropharyngeal phase: Secondary | ICD-10-CM

## 2021-09-11 LAB — COMPREHENSIVE METABOLIC PANEL
AG Ratio: 2.1 (calc) (ref 1.0–2.5)
ALT: 19 U/L (ref 6–29)
AST: 17 U/L (ref 10–35)
Albumin: 4.1 g/dL (ref 3.6–5.1)
Alkaline phosphatase (APISO): 61 U/L (ref 37–153)
BUN: 8 mg/dL (ref 7–25)
CO2: 20 mmol/L (ref 20–32)
Calcium: 10 mg/dL (ref 8.6–10.4)
Chloride: 113 mmol/L — ABNORMAL HIGH (ref 98–110)
Creat: 0.88 mg/dL (ref 0.60–1.00)
Globulin: 2 g/dL (calc) (ref 1.9–3.7)
Glucose, Bld: 94 mg/dL (ref 65–99)
Potassium: 4.4 mmol/L (ref 3.5–5.3)
Sodium: 141 mmol/L (ref 135–146)
Total Bilirubin: 0.6 mg/dL (ref 0.2–1.2)
Total Protein: 6.1 g/dL (ref 6.1–8.1)

## 2021-09-11 LAB — CBC WITH DIFFERENTIAL/PLATELET
Absolute Monocytes: 373 cells/uL (ref 200–950)
Basophils Absolute: 18 cells/uL (ref 0–200)
Basophils Relative: 0.4 %
Eosinophils Absolute: 18 cells/uL (ref 15–500)
Eosinophils Relative: 0.4 %
HCT: 44.1 % (ref 35.0–45.0)
Hemoglobin: 14.4 g/dL (ref 11.7–15.5)
Lymphs Abs: 1398 cells/uL (ref 850–3900)
MCH: 27.8 pg (ref 27.0–33.0)
MCHC: 32.7 g/dL (ref 32.0–36.0)
MCV: 85.1 fL (ref 80.0–100.0)
MPV: 11 fL (ref 7.5–12.5)
Monocytes Relative: 8.1 %
Neutro Abs: 2792 cells/uL (ref 1500–7800)
Neutrophils Relative %: 60.7 %
Platelets: 196 10*3/uL (ref 140–400)
RBC: 5.18 10*6/uL — ABNORMAL HIGH (ref 3.80–5.10)
RDW: 14.9 % (ref 11.0–15.0)
Total Lymphocyte: 30.4 %
WBC: 4.6 10*3/uL (ref 3.8–10.8)

## 2021-09-11 LAB — LIPASE: Lipase: 22 U/L (ref 7–60)

## 2021-09-11 LAB — TSH: TSH: 0.11 mIU/L — ABNORMAL LOW (ref 0.40–4.50)

## 2021-09-11 MED ORDER — LOSARTAN POTASSIUM 25 MG PO TABS: 25.0000 mg | ORAL_TABLET | Freq: Every day | ORAL | 3 refills | Status: DC

## 2021-09-11 NOTE — Progress Notes (Unsigned)
b

## 2021-09-11 NOTE — Telephone Encounter (Signed)
Crystal at [facility] called Webb Silversmith CMA, speech needed 2 diagnosis codes for referral. Loma Sousa gave diagnosis codes for GERD and oropharyngeal dysphagia. Crystal requested a new speech referral be placed with those diagnosis codes. ? ?New speech therapy referral entered. Per appt desk she is scheduled for MBSS. ?

## 2021-09-11 NOTE — Telephone Encounter (Signed)
I spoke with patient and she agrees to start losartan 25 mg daily and have bmet at [place] done in 2 weeks. She will also update [place] on BP readings ?

## 2021-09-11 NOTE — Telephone Encounter (Signed)
Left message to return call again. ?

## 2021-09-12 ENCOUNTER — Other Ambulatory Visit (HOSPITAL_COMMUNITY): Payer: Self-pay | Admitting: Specialist

## 2021-09-12 DIAGNOSIS — K219 Gastro-esophageal reflux disease without esophagitis: Secondary | ICD-10-CM

## 2021-09-12 DIAGNOSIS — R1312 Dysphagia, oropharyngeal phase: Secondary | ICD-10-CM

## 2021-09-13 LAB — GI PATHOGEN PANEL BY PCR, STOOL
Adenovirus F40/41: NEGATIVE
Astrovirus: NEGATIVE
C.DIFFICILE TOXIN: NEGATIVE
CRYPTOSPORIDIUM SPECIES: NEGATIVE
Campylobacter species: NEGATIVE
Cyclospora cayetanensis: NEGATIVE
ENTEROAGGREGATIVE E. COLI (EAEC): NEGATIVE
ENTEROPATHOGENIC E. COLI (EPEC): NEGATIVE
ENTEROTOXIGENIC E. COLI (ETEC): NEGATIVE
Entamoeba histolytica: NEGATIVE
GIARDIA: NEGATIVE
Norovirus GI/GII: NEGATIVE
Plesiomonas shigelloides: NEGATIVE
ROTAVIRUS: NEGATIVE
SHIGA TOXIN PRODUCING E. COLI: NEGATIVE
SHIGELLA/ENTEROINVASIVE E. COLI: NEGATIVE
Salmonella species: NEGATIVE
Sapovirus: NEGATIVE
Vibrio cholerae: NEGATIVE
Vibrio species: NEGATIVE
YERSINIA SPECIES: NEGATIVE

## 2021-09-13 LAB — C. DIFFICILE GDH AND TOXIN A/B
GDH ANTIGEN: NOT DETECTED
MICRO NUMBER:: 13154408
SPECIMEN QUALITY:: ADEQUATE
TOXIN A AND B: NOT DETECTED

## 2021-09-24 DIAGNOSIS — R609 Edema, unspecified: Secondary | ICD-10-CM | POA: Diagnosis not present

## 2021-09-24 DIAGNOSIS — I10 Essential (primary) hypertension: Secondary | ICD-10-CM | POA: Diagnosis not present

## 2021-09-24 DIAGNOSIS — R1312 Dysphagia, oropharyngeal phase: Secondary | ICD-10-CM | POA: Diagnosis not present

## 2021-09-24 DIAGNOSIS — G4700 Insomnia, unspecified: Secondary | ICD-10-CM | POA: Diagnosis not present

## 2021-09-24 DIAGNOSIS — R350 Frequency of micturition: Secondary | ICD-10-CM | POA: Diagnosis not present

## 2021-09-24 DIAGNOSIS — N1831 Chronic kidney disease, stage 3a: Secondary | ICD-10-CM | POA: Diagnosis not present

## 2021-09-24 DIAGNOSIS — E876 Hypokalemia: Secondary | ICD-10-CM | POA: Diagnosis not present

## 2021-09-24 DIAGNOSIS — K219 Gastro-esophageal reflux disease without esophagitis: Secondary | ICD-10-CM | POA: Diagnosis not present

## 2021-09-24 DIAGNOSIS — G43909 Migraine, unspecified, not intractable, without status migrainosus: Secondary | ICD-10-CM | POA: Diagnosis not present

## 2021-09-26 ENCOUNTER — Ambulatory Visit (HOSPITAL_COMMUNITY): Payer: Medicare Other | Admitting: Speech Pathology

## 2021-09-26 DIAGNOSIS — Z79899 Other long term (current) drug therapy: Secondary | ICD-10-CM | POA: Diagnosis not present

## 2021-09-27 ENCOUNTER — Ambulatory Visit (HOSPITAL_COMMUNITY)
Admission: RE | Admit: 2021-09-27 | Discharge: 2021-09-27 | Disposition: A | Discharge status: Home / Self Care | Payer: Medicare Other | Source: Ambulatory Visit | Attending: Gastroenterology | Admitting: Gastroenterology

## 2021-09-27 ENCOUNTER — Ambulatory Visit (HOSPITAL_COMMUNITY): Payer: Medicare Other | Attending: Gastroenterology | Admitting: Speech Pathology

## 2021-09-27 ENCOUNTER — Encounter (HOSPITAL_COMMUNITY): Payer: Self-pay | Admitting: Speech Pathology

## 2021-09-27 DIAGNOSIS — K219 Gastro-esophageal reflux disease without esophagitis: Secondary | ICD-10-CM | POA: Insufficient documentation

## 2021-09-27 DIAGNOSIS — R1312 Dysphagia, oropharyngeal phase: Secondary | ICD-10-CM | POA: Insufficient documentation

## 2021-09-27 DIAGNOSIS — R1310 Dysphagia, unspecified: Secondary | ICD-10-CM | POA: Diagnosis not present

## 2021-09-27 LAB — BASIC METABOLIC PANEL
BUN: 8 mg/dL (ref 7–25)
CO2: 21 mmol/L (ref 20–32)
Calcium: 9.4 mg/dL (ref 8.6–10.4)
Chloride: 114 mmol/L — ABNORMAL HIGH (ref 98–110)
Creat: 0.84 mg/dL (ref 0.60–1.00)
Glucose, Bld: 118 mg/dL (ref 65–139)
Potassium: 4.2 mmol/L (ref 3.5–5.3)
Sodium: 141 mmol/L (ref 135–146)

## 2021-09-27 NOTE — Therapy (Signed)
Donna [facility] ?[address] ?[place], [place], [address] ?Phone: [phone]   Fax:  [phone] ? ?Modified Barium Swallow ? ?Patient Details  ?Name: [name] ?MRN: [id number] ?Date of Birth: January 31, 1946 ?No data recorded ? ?Encounter Date: 09/27/2021 ? ? End of Session - 09/27/21 1500   ? ? Visit Number 1   ? Number of Visits 1   ? Authorization Type U[employer] Medicare   ? SLP Start Time 1307   ? SLP Stop Time  1345   ? SLP Time Calculation (min) 38 min   ? Activity Tolerance Patient tolerated treatment well   ? ?  ?  ? ?  ? ? ?Past Medical History:  ?Diagnosis Date  ? Adenomatous colon polyp 2006  ? excised in 2006 & 2010Due surveillance 06/2013  ? Anxiety   ? Anxiety and depression   ? Asthma   ? Breast mass   ? right nipple bengn mass per patient  ? Breast tumor   ? Chest discomfort   ? Chronic back pain   ? Chronic constipation   ? Complication of anesthesia   ? Depression   ? Diverticulosis   ? Fibromyalgia   ? Gastroesophageal reflux disease   ? Hiatal hernia   ? History of benign esophageal tumor 2010  ? granular cell esophageal tumor (Dx 06/2008), resected via EMR 2011, due repeat EGD 02/2012  ? Hyperlipidemia   ? Hypertension   ? Insomnia   ? Migraines   ? PONV (postoperative nausea and vomiting)   ? Psychosis ([place])   ? Sleep apnea   ? Stop Bang score of 5. Pt said she was told by Dr. Merlene Laughter that she had "a little bit" of sleep apena, but not bad enough to treat.  ? Thyroid nodule   ? Tumor of esophagus   ? ? ?Past Surgical History:  ?Procedure Laterality Date  ? ABDOMINAL HYSTERECTOMY    ? BALLOON DILATION  08/23/2021  ? Procedure: BALLOON DILATION;  Surgeon: Eloise Harman, DO;  [facility];  Service: Endoscopy;;  ? BIOPSY  10/14/2019  ? Procedure: BIOPSY;  Surgeon: Daneil Dolin, MD;  [facility];  Service: Endoscopy;;  ? BRAVO [place] STUDY  12/11/2011  ? Procedure: BRAVO [place];  Surgeon: Daneil Dolin, MD;  [facility];   Service: Endoscopy;  Laterality: N/A;  ? BREAST EXCISIONAL BIOPSY  1990s, 2012  ? Left x2-sclerosing ductal papilloma-2012  ? CHOLECYSTECTOMY N/A 04/09/2013  ? Procedure: LAPAROSCOPIC CHOLECYSTECTOMY;  Surgeon: Jamesetta So, MD;  [facility]: [facility];  Service: General;  Laterality: N/A;  ? COLONOSCOPY  06/2008, 06/2011  ? sigmoid tics, tubular adenoma; 2013: anal canal hemorrhoids  ? COLONOSCOPY  07/10/2011  ? Anal canal hemorrhoids likely the cause of hematochezia in the setting of constipation; otherwise normal rectum ;submucosal  petechiae in left colon of doubtful clinical significance; otherwise, normal colon  ? COLONOSCOPY WITH PROPOFOL N/A 11/10/2017  ? cancelled in pre-op  ? COLONOSCOPY WITH PROPOFOL N/A 07/23/2018  ? Procedure: COLONOSCOPY WITH PROPOFOL;  Surgeon: Daneil Dolin, MD; 1 tubular adenoma, diverticulosis in the sigmoid and descending colon, nonbleeding internal hemorrhoids.  No recommendations to repeat due to age.  ? ESOPHAGEAL BRUSHING  08/23/2021  ? Procedure: ESOPHAGEAL BRUSHING;  Surgeon: Eloise Harman, DO;  [facility];  Service: Endoscopy;;  ? ESOPHAGEAL DILATION N/A 05/08/2015  ? Procedure: ESOPHAGEAL DILATION;  Surgeon: Daneil Dolin, MD;  [facility];  Service: Endoscopy;  Laterality: N/AVenia Minks 54/56  ? ESOPHAGOGASTRODUODENOSCOPY  12/11/2011  ? INO:[id number] Schatzki's ring; otherwise normal/Small hiatal hernia. Antral and bulbar erosions  ? ESOPHAGOGASTRODUODENOSCOPY (EGD) WITH PROPOFOL N/A 05/08/2015  ? Dr. Gala Romney: mild erosive reflux esophagitis, non-critical Schatzki's ring s/p dilation. Hiatal hernia.   ? ESOPHAGOGASTRODUODENOSCOPY (EGD) WITH PROPOFOL N/A 10/14/2019  ? Procedure: ESOPHAGOGASTRODUODENOSCOPY (EGD) WITH PROPOFOL;  Surgeon: Daneil Dolin, MD;  Nonobstructing Schatzki ring, small hiatal hernia, abnormal gastric mucosa s/p biopsied, normal first and second portion of the duodenum.  Pathology with chronic inactive gastritis, no H. pylori.  ?  ESOPHAGOGASTRODUODENOSCOPY (EGD) WITH PROPOFOL N/A 08/23/2021  ? Procedure: ESOPHAGOGASTRODUODENOSCOPY (EGD) with or without dilation WITH PROPOFOL;  Surgeon: Eloise Harman, DO;  [facility];  Service: Endoscopy;  Laterality: N/A;  ? EUS  08/2010  ? Dr [facility] with EGD. Retained food. No recurrent esophageal lesion, bx negative.  ? IR KYPHO THORACIC WITH BONE BIOPSY  10/17/2020  ? IR RADIOLOGIST EVAL & MGMT  09/12/2020  ? LUNG BIOPSY    ? negative  ? PARATHYROIDECTOMY  April 2017  ? [facility].   ? PARATHYROIDECTOMY    ? POLYPECTOMY  07/23/2018  ? Procedure: POLYPECTOMY;  Surgeon: Daneil Dolin, MD;  [facility];  Service: Endoscopy;;  colon  ? RIGHT OOPHORECTOMY    ? benign disease  ? SHOULDER ARTHROSCOPY    ? Right; bone spurs removed  ? TONSILLECTOMY    ? TOTAL KNEE ARTHROPLASTY  2002  ? Right; previous arthroscopic surgery  ? ? ?There were no vitals filed for this visit. ? ? Subjective Assessment - 09/27/21 1451   ? ? Subjective "I have trouble swallowing ice cream and macaroni and cheese."   ? Special Tests MBSS   ? Currently in Pain? No/denies   ? ?  ?  ? ?  ? ? ? ? ? ? General - 09/27/21 1451   ? ?  ? General Information  ? Date of Onset 09/05/21   ? HPI [name] is a 76 yo female who was referred for MBSS by Venetia Night, NP of GI due to Pt with reports of difficulty swallowing solid foods and ice cream. Pt is known to SLP service from previous MBSS (Sept 2022 and Jan 2022 regular/thin) and BSE (Feb 2023). Pt had EGD with dilation with Dr. Abbey Chatters during an acute admission in March 2023 (mild Schatzki's ring and esophagitis). Pt was accompanied to today's appointment by her sister who lives in [place]. Her sister expressed concerns regarding whether Pt's neuroleptic medications could be impacting her swallow. Pt also has TD from medications, but have decreased somewhat per family.   ? Type of Study MBS-Modified Barium Swallow Study   ? Previous Swallow Assessment Jan and Sept 2022  reg/thin   ? Diet Prior to this Study Regular;Thin liquids   ? Temperature Spikes Noted No   ? Respiratory Status Room air   ? History of Recent Intubation No   ? Behavior/Cognition Alert;Cooperative;Pleasant mood   ? Oral Cavity Assessment Within Functional Limits   ? Oral Care Completed by SLP No   ? Oral Cavity - Dentition Dentures, top   ? Vision Functional for self feeding   ? Self-Feeding Abilities Able to feed self   ? Patient Positioning Upright in chair   ? Baseline Vocal Quality Normal   ? Volitional Cough Strong   ? Volitional Swallow Able to elicit   ? Anatomy Within functional limits   ?  Pharyngeal Secretions Not observed secondary MBS   ? ?  ?  ? ?  ? ? ? ? ? Oral Preparation/Oral Phase - 09/27/21 1456   ? ?  ? Oral Preparation/Oral Phase  ? Oral Phase Impaired   ?  ? Oral - Thin  ? Oral - Thin Teaspoon Within functional limits   ? Oral - Thin Cup Within functional limits   ? Oral - Thin Straw Within functional limits   ?  ? Oral - Solids  ? Oral - Puree Piecemeal swallowing   ? Oral - Regular Piecemeal swallowing;Delayed A-P transit   ? Oral - Pill Within functional limits   ?  ? Electrical stimulation - Oral Phase  ? Was Electrical Stimulation Used No   ? ?  ?  ? ?  ? ? ? Pharyngeal Phase - 09/27/21 1457   ? ?  ? Pharyngeal Phase  ? Pharyngeal Phase Impaired   ?  ? Pharyngeal - Thin  ? Pharyngeal- Thin Teaspoon Within functional limits   ? Pharyngeal- Thin Cup Within functional limits;Pharyngeal residue - valleculae;Lateral channel residue   trace residuals clear with repeat swallow  ? Pharyngeal- Thin Straw Within functional limits   ?  ? Pharyngeal - Solids  ? Pharyngeal- Puree Within functional limits   ? Pharyngeal- Regular Within functional limits   ? Pharyngeal- Pill Within functional limits   ?  ? Electrical Stimulation - Pharyngeal Phase  ? Was Electrical Stimulation Used No   ? ?  ?  ? ?  ? ? ? Cricopharyngeal Phase - 09/27/21 1459   ? ?  ? Cervical Esophageal Phase  ? Cervical Esophageal  Phase Impaired   ?  ? Cervical Esophageal Phase - Comment  ? Other Esophageal Phase Observations Transient delay of barium tablet in distal esophagus which eventually cleared with liquid wash   ? ?  ?  ? ?  ? ? ? ? Pl

## 2021-10-12 ENCOUNTER — Inpatient Hospital Stay: Payer: Medicare Other | Admitting: Gastroenterology

## 2021-10-15 DIAGNOSIS — R7301 Impaired fasting glucose: Secondary | ICD-10-CM | POA: Diagnosis not present

## 2021-10-15 DIAGNOSIS — E039 Hypothyroidism, unspecified: Secondary | ICD-10-CM | POA: Diagnosis not present

## 2021-10-15 DIAGNOSIS — N1831 Chronic kidney disease, stage 3a: Secondary | ICD-10-CM | POA: Diagnosis not present

## 2021-10-16 DIAGNOSIS — G43711 Chronic migraine without aura, intractable, with status migrainosus: Secondary | ICD-10-CM | POA: Diagnosis not present

## 2021-10-16 DIAGNOSIS — R296 Repeated falls: Secondary | ICD-10-CM | POA: Diagnosis not present

## 2021-10-16 DIAGNOSIS — I951 Orthostatic hypotension: Secondary | ICD-10-CM | POA: Diagnosis not present

## 2021-10-16 DIAGNOSIS — G3184 Mild cognitive impairment, so stated: Secondary | ICD-10-CM | POA: Diagnosis not present

## 2021-10-16 DIAGNOSIS — Z79899 Other long term (current) drug therapy: Secondary | ICD-10-CM | POA: Diagnosis not present

## 2021-10-16 DIAGNOSIS — G894 Chronic pain syndrome: Secondary | ICD-10-CM | POA: Diagnosis not present

## 2021-10-16 DIAGNOSIS — M130 Polyarthritis, unspecified: Secondary | ICD-10-CM | POA: Diagnosis not present

## 2021-10-21 DIAGNOSIS — N1831 Chronic kidney disease, stage 3a: Secondary | ICD-10-CM | POA: Diagnosis not present

## 2021-10-21 DIAGNOSIS — E785 Hyperlipidemia, unspecified: Secondary | ICD-10-CM | POA: Diagnosis not present

## 2021-10-21 DIAGNOSIS — K219 Gastro-esophageal reflux disease without esophagitis: Secondary | ICD-10-CM | POA: Diagnosis not present

## 2021-10-21 DIAGNOSIS — I129 Hypertensive chronic kidney disease with stage 1 through stage 4 chronic kidney disease, or unspecified chronic kidney disease: Secondary | ICD-10-CM | POA: Diagnosis not present

## 2021-10-22 ENCOUNTER — Ambulatory Visit (INDEPENDENT_AMBULATORY_CARE_PROVIDER_SITE_OTHER): Payer: Medicare Other | Admitting: Orthopedic Surgery

## 2021-10-22 DIAGNOSIS — M7052 Other bursitis of knee, left knee: Secondary | ICD-10-CM

## 2021-10-22 DIAGNOSIS — M1712 Unilateral primary osteoarthritis, left knee: Secondary | ICD-10-CM

## 2021-10-22 NOTE — Progress Notes (Signed)
Chief Complaint  ?Patient presents with  ? Follow-up  ?  Recheck on left knee  ? ?Encounter Diagnoses  ?Name Primary?  ? Pes anserinus bursitis of left knee Yes  ? Primary osteoarthritis of left knee   ? ? ?Procedure note left knee injection  ? ?verbal consent was obtained to inject left knee joint ? ?Timeout was completed to confirm the site of injection ? ?The medications used were depomedrol 40 mg and 1% lidocaine 3 cc ?Anesthesia was provided by ethyl chloride and the skin was prepped with alcohol. ? ?After cleaning the skin with alcohol a 20-gauge needle was used to inject the left knee joint. There were no complications. A sterile bandage was applied. ? ?Procedure note Left knee injection for bursitis  ? ?verbal consent was obtained to inject Left knee PES BURSA ? ?Timeout was completed to confirm the site of injection ? ?The medications used were 40 mg of Depo-Medrol and 1% lidocaine 3 cc ? ?Anesthesia was provided by ethyl chloride and the skin was prepped with alcohol. ? ?After cleaning the skin with alcohol a 25-gauge needle was used to inject the left knee bursa  ? ?There were no complications and a sterile bandage was applied   ? ? ?

## 2021-10-23 DIAGNOSIS — N1831 Chronic kidney disease, stage 3a: Secondary | ICD-10-CM | POA: Diagnosis not present

## 2021-10-23 DIAGNOSIS — G43909 Migraine, unspecified, not intractable, without status migrainosus: Secondary | ICD-10-CM | POA: Diagnosis not present

## 2021-10-23 DIAGNOSIS — R609 Edema, unspecified: Secondary | ICD-10-CM | POA: Diagnosis not present

## 2021-10-23 DIAGNOSIS — G4700 Insomnia, unspecified: Secondary | ICD-10-CM | POA: Diagnosis not present

## 2021-10-23 DIAGNOSIS — R350 Frequency of micturition: Secondary | ICD-10-CM | POA: Diagnosis not present

## 2021-10-23 DIAGNOSIS — E876 Hypokalemia: Secondary | ICD-10-CM | POA: Diagnosis not present

## 2021-10-23 DIAGNOSIS — I10 Essential (primary) hypertension: Secondary | ICD-10-CM | POA: Diagnosis not present

## 2021-10-23 DIAGNOSIS — R1312 Dysphagia, oropharyngeal phase: Secondary | ICD-10-CM | POA: Diagnosis not present

## 2021-10-23 DIAGNOSIS — K219 Gastro-esophageal reflux disease without esophagitis: Secondary | ICD-10-CM | POA: Diagnosis not present

## 2021-11-08 ENCOUNTER — Other Ambulatory Visit: Payer: Self-pay | Admitting: Gastroenterology

## 2021-11-08 DIAGNOSIS — K219 Gastro-esophageal reflux disease without esophagitis: Secondary | ICD-10-CM

## 2021-11-08 DIAGNOSIS — R1310 Dysphagia, unspecified: Secondary | ICD-10-CM

## 2021-11-20 ENCOUNTER — Ambulatory Visit (HOSPITAL_COMMUNITY): Payer: Medicare Other | Admitting: Physical Therapy

## 2021-11-21 ENCOUNTER — Ambulatory Visit (HOSPITAL_COMMUNITY): Payer: Medicare Other | Attending: Gastroenterology

## 2021-11-21 DIAGNOSIS — M6281 Muscle weakness (generalized): Secondary | ICD-10-CM | POA: Insufficient documentation

## 2021-11-21 DIAGNOSIS — M546 Pain in thoracic spine: Secondary | ICD-10-CM | POA: Diagnosis not present

## 2021-11-21 DIAGNOSIS — M545 Low back pain, unspecified: Secondary | ICD-10-CM | POA: Diagnosis not present

## 2021-11-21 DIAGNOSIS — R262 Difficulty in walking, not elsewhere classified: Secondary | ICD-10-CM | POA: Insufficient documentation

## 2021-11-21 NOTE — Therapy (Signed)
OUTPATIENT PHYSICAL THERAPY THORACOLUMBAR EVALUATION   Patient Name: [name] MRN: [id number] DOB:11/13/1945, 76 y.o., female Today's Date: 11/21/2021   PT End of Session - 11/21/21 0949     Visit Number 1    Number of Visits 8    Date for PT Re-Evaluation 12/19/21    Authorization Type [employer] Medicare; no auth required, no co-pay    Authorization Time Period 06/24/21-06-23-2022    Progress Note Due on Visit 10    PT Start Time 0944    PT Stop Time 1028    PT Time Calculation (min) 44 min             Past Medical History:  Diagnosis Date   Adenomatous colon polyp 2006   excised in 2006 & 2010Due surveillance 06/2013   Anxiety    Anxiety and depression    Asthma    Breast mass    right nipple bengn mass per patient   Breast tumor    Chest discomfort    Chronic back pain    Chronic constipation    Complication of anesthesia    Depression    Diverticulosis    Fibromyalgia    Gastroesophageal reflux disease    Hiatal hernia    History of benign esophageal tumor 2010   granular cell esophageal tumor (Dx 06/2008), resected via EMR 2011, due repeat EGD 02/2012   Hyperlipidemia    Hypertension    Insomnia    Migraines    PONV (postoperative nausea and vomiting)    Psychosis ([place])    Sleep apnea    Stop Bang score of 5. Pt said she was told by Dr. Merlene Laughter that she had "a little bit" of sleep apena, but not bad enough to treat.   Thyroid nodule    Tumor of esophagus    Past Surgical History:  Procedure Laterality Date   ABDOMINAL HYSTERECTOMY     BALLOON DILATION  08/23/2021   Procedure: BALLOON DILATION;  Surgeon: Eloise Harman, DO;  [facility];  Service: Endoscopy;;   BIOPSY  10/14/2019   Procedure: BIOPSY;  Surgeon: Daneil Dolin, MD;  [facility];  Service: Endoscopy;;   BRAVO [place] STUDY  12/11/2011   Procedure: BRAVO [place];  Surgeon: Daneil Dolin, MD;  [facility];  Service: Endoscopy;  Laterality: N/A;   BREAST  EXCISIONAL BIOPSY  1990s, 2012   Left x2-sclerosing ductal papilloma-2012   CHOLECYSTECTOMY N/A 04/09/2013   Procedure: LAPAROSCOPIC CHOLECYSTECTOMY;  Surgeon: Jamesetta So, MD;  Location: [facility];  Service: General;  Laterality: N/A;   COLONOSCOPY  06/2008, 06/2011   sigmoid tics, tubular adenoma; 2013: anal canal hemorrhoids   COLONOSCOPY  07/10/2011   Anal canal hemorrhoids likely the cause of hematochezia in the setting of constipation; otherwise normal rectum ;submucosal  petechiae in left colon of doubtful clinical significance; otherwise, normal colon   COLONOSCOPY WITH PROPOFOL N/A 11/10/2017   cancelled in pre-op   COLONOSCOPY WITH PROPOFOL N/A 07/23/2018   Procedure: COLONOSCOPY WITH PROPOFOL;  Surgeon: Daneil Dolin, MD; 1 tubular adenoma, diverticulosis in the sigmoid and descending colon, nonbleeding internal hemorrhoids.  No recommendations to repeat due to age.   ESOPHAGEAL BRUSHING  08/23/2021   Procedure: ESOPHAGEAL BRUSHING;  Surgeon: Eloise Harman, DO;  [facility];  Service: Endoscopy;;   ESOPHAGEAL DILATION N/A 05/08/2015   Procedure: ESOPHAGEAL DILATION;  Surgeon: Daneil Dolin, MD;  Location: [facility];  Service: Endoscopy;  Laterality: N/AVenia Minks 54/56   ESOPHAGOGASTRODUODENOSCOPY  12/11/2011   GYI:RSWNIOEVOJJ Schatzki's ring; otherwise normal/Small hiatal hernia. Antral and bulbar erosions   ESOPHAGOGASTRODUODENOSCOPY (EGD) WITH PROPOFOL N/A 05/08/2015   Dr. Gala Romney: mild erosive reflux esophagitis, non-critical Schatzki's ring s/p dilation. Hiatal hernia.    ESOPHAGOGASTRODUODENOSCOPY (EGD) WITH PROPOFOL N/A 10/14/2019   Procedure: ESOPHAGOGASTRODUODENOSCOPY (EGD) WITH PROPOFOL;  Surgeon: Daneil Dolin, MD;  Nonobstructing Schatzki ring, small hiatal hernia, abnormal gastric mucosa s/p biopsied, normal first and second portion of the duodenum.  Pathology with chronic inactive gastritis, no H. pylori.   ESOPHAGOGASTRODUODENOSCOPY (EGD) WITH PROPOFOL N/A  08/23/2021   Procedure: ESOPHAGOGASTRODUODENOSCOPY (EGD) with or without dilation WITH PROPOFOL;  Surgeon: Eloise Harman, DO;  [facility];  Service: Endoscopy;  Laterality: N/A;   EUS  08/2010   Dr [facility] with EGD. Retained food. No recurrent esophageal lesion, bx negative.   IR KYPHO THORACIC WITH BONE BIOPSY  10/17/2020   IR RADIOLOGIST EVAL & MGMT  09/12/2020   LUNG BIOPSY     negative   PARATHYROIDECTOMY  April 2017   [facility].    PARATHYROIDECTOMY     POLYPECTOMY  07/23/2018   Procedure: POLYPECTOMY;  Surgeon: Daneil Dolin, MD;  [facility];  Service: Endoscopy;;  colon   RIGHT OOPHORECTOMY     benign disease   SHOULDER ARTHROSCOPY     Right; bone spurs removed   TONSILLECTOMY     TOTAL KNEE ARTHROPLASTY  2002   Right; previous arthroscopic surgery   Patient Active Problem List   Diagnosis Date Noted   AKI (acute kidney injury) ([place]) 08/21/2021   Abnormal EKG 08/21/2021   Type 2 diabetes mellitus ([place]) 08/21/2021   Hypokalemia 08/20/2021   Early satiety 08/14/2021   Loss of weight 08/14/2021   Chronic diastolic heart failure ([place]) 04/01/2021   Chronic pain syndrome 04/01/2021   Chronic kidney disease, stage 3a ([place]) 12/21/2020   Neck pain 03/20/2020   Bloating 11/18/2019   Chronic migraine without aura, with status migrainosus 11/04/2019   Mild cognitive disorder 11/04/2019   Dysphagia    H/O adenomatous polyp of colon 09/29/2017   Lower abdominal pain 09/29/2017   Obstructive sleep apnea 09/06/2017   Obesity (BMI 30-39.9) 03/07/2017   Primary osteoarthritis of both hands 03/07/2017   Lower extremity edema 03/03/2017   Restless legs syndrome 01/20/2017   Thyroid nodule 10/17/2016   Pruritus 10/06/2016   Major depressive disorder with psychotic features ([place]) 09/11/2016   Neurocognitive deficits 09/11/2016   (HFpEF) heart failure with preserved ejection fraction ([place]) 07/22/2016   Benzodiazepine withdrawal, with perceptual disturbance  ([place]) 07/22/2016   Urinary frequency 11/07/2015   Abdominal pain 07/26/2015   Hyperparathyroidism ([place]) 06/15/2015   History of depression 06/14/2015   Hypercalcemia 06/13/2015   Major neurocognitive disorder ([place]) 06/13/2015   Confusion 05/29/2015   Odynophagia    Schatzki's ring    Hiatal hernia    Knee contusion 05/02/2014   Acquired trigger finger 10/12/2013   Chronic constipation 07/15/2013   Abdominal pain, epigastric 07/15/2013   LLQ pain 07/15/2013   Lightheaded 01/21/2013   Elevated LFTs 12/26/2012   Insomnia 05/06/2012   History of benign esophageal tumor    Adenomatous colon polyp    DEGENERATIVE DISC DISEASE, LUMBOSACRAL SPINE 05/01/2010   Mixed hyperlipidemia 08/22/2009   THYROID NODULE 08/16/2009   Anxiety and depression 08/16/2009   Essential hypertension 08/16/2009   GERD (gastroesophageal reflux disease) 08/16/2009   FIBROMYALGIA 08/16/2009    PCP:  Edwinna Areola. [place] Crane, MD  REFERRING PROVIDER: Phillips Odor, MD  REFERRING DIAG: LBP  Rationale for Evaluation and Treatment Rehabilitation  THERAPY DIAG:  Low back pain, unspecified back pain laterality, unspecified chronicity, unspecified whether sciatica present  Difficulty in walking, not elsewhere classified  Muscle weakness (generalized)  Pain in thoracic spine  ONSET DATE: 7 months ago  SUBJECTIVE:                                                                                                                                                                                           SUBJECTIVE STATEMENT: Patient reports she fell in the tub; fell backwards and broke her back about 7 months or so ago. Stress fracture; had KYPHO THORACIC "cement put in my back"; helped but did not take care of all the pain. Pain ranges from 0-10/10. It doesn't hurt everyday but hurts with activity. Can only stand about 5 minutes before it starts hurting her; knee pain bilaterally also limits activity  PERTINENT HISTORY:   Right TKA in 2002 Bilateral knee pain; injections every 3 months with Dr. Aline Brochure osteoporosis  PAIN:  Are you having pain? Yes: NPRS scale: 0/10 Pain location: mid back Pain description: aching Aggravating factors: activity Relieving factors: rest, medication   PRECAUTIONS: Fall  WEIGHT BEARING RESTRICTIONS No  FALLS:  Has patient fallen in last 6 months? Yes. Number of falls 3  LIVING ENVIRONMENT: Lives with: lives with their daughter Lives in: House/apartment Stairs: No Has following equipment at home: Programmer, multimedia, Environmental consultant - 2 wheeled, Health visitor, Wheelchair (manual), shower chair, bed side commode, and Grab bars  OCCUPATION: retired Garment/textile technologist  PLOF: Independent; now using a [place]; now daughter assists her with household tasks but she can dress and bathe mod independently  PATIENT GOALS be able to cook without my back hurting   OBJECTIVE:   DIAGNOSTIC FINDINGS:  Scheduled for an x-ray 12/12/20  PATIENT SURVEYS:  FOTO 32  SCREENING FOR RED FLAGS: Bowel or bladder incontinence: Yes: urine only Spinal tumors: No Cauda equina syndrome: No Compression fracture: Yes: hx of previous compression fx Abdominal aneurysm: No  COGNITION:  Overall cognitive status: History of cognitive impairments - at baseline     SENSATION: Reports occasional  numbness in her feet   POSTURE: rounded shoulders, forward head, and increased thoracic kyphosis  PALPATION: Mild tenderness lower thoracic spine spinous processes and paraspinals  LUMBAR ROM:   Active  A/PROM  eval  Flexion wfl  Extension 50% available  Right lateral flexion wfl  Left lateral flexion wfl  Right rotation wfl  Left rotation wfl   (  Blank rows = not tested)   LOWER EXTREMITY MMT:    MMT Right eval Left eval  Hip flexion 4+ 4+  Hip extension 3- 4  Hip abduction 3+ 3+  Hip adduction    Hip internal rotation    Hip external rotation    Knee flexion     Knee extension 4 5  Ankle dorsiflexion 5 5  Ankle plantarflexion    Ankle inversion    Ankle eversion     (Blank rows = not tested)   FUNCTIONAL TESTS:  5 times sit to stand: 26 sec  GAIT: Distance walked: 60 ft Assistive device utilized: Walker - 2 wheeled Level of assistance: SBA Comments: slow gait speed; slight forward flexed trunk    TODAY'S TREATMENT  Physical therapy evaluation; HEP instruction   PATIENT EDUCATION:  Education details: HEP Person educated: Patient Education method: Consulting civil engineer, Media planner, Corporate treasurer cues, Verbal cues, and Handouts Education comprehension: verbalized understanding, returned demonstration, verbal cues required, tactile cues required, and needs further education   HOME EXERCISE PROGRAM: Access Code: [email or web address] URL: [email or web address] Date: 11/21/2021 Prepared by: [facility]  Exercises - Supine Bridge  - 2 x daily - 7 x weekly - 1 sets - 10 reps - Prone Hip Extension  - 2 x daily - 7 x weekly - 1 sets - 10 reps - Supine Scapular Retraction  - 2 x daily - 7 x weekly - 1 sets - 10 reps  ASSESSMENT:  CLINICAL IMPRESSION: Patient is a 76 y.o. female who was seen today for physical therapy evaluation and treatment for back pain. She presents with noted kyphotic posturing, decreased AROM lumbar and thoracic spinal extension, weakness in her legs and low back and knee pain that limit activity tolerance in her home; unable to stand to cook a meal because of back pain.  Patient will benefit from skilled PT interventions to address back pain, decreased thoracic and lumbar mobility and core weakness, LE weakness, transfer and gait training, fall prevention and the instruction, development and modification of HEP.   OBJECTIVE IMPAIRMENTS Abnormal gait, decreased activity tolerance, decreased balance, decreased endurance, decreased mobility, difficulty walking, decreased ROM, decreased strength, decreased safety awareness,  hypomobility, increased fascial restrictions, impaired perceived functional ability, impaired flexibility, postural dysfunction, and pain.   ACTIVITY LIMITATIONS carrying, lifting, bending, standing, squatting, sleeping, stairs, transfers, reach over head, and locomotion level  PARTICIPATION LIMITATIONS: meal prep, cleaning, laundry, medication management, shopping, community activity, and yard work  PERSONAL FACTORS Age, Fitness, and 1-2 comorbidities: osteoporosis and HBP  are also affecting patient's functional outcome.   REHAB POTENTIAL: Good  CLINICAL DECISION MAKING: Stable/uncomplicated  EVALUATION COMPLEXITY: Low   GOALS: Goals reviewed with patient? No  SHORT TERM GOALS: Target date: 12/05/2021   patient will be independent with initial HEP Baseline: Goal status: INITIAL  2.  Patient will increase right hip extension to 3+ to improve patient ability to transfer sit to stand efficiently. Baseline: 3- Goal status: INITIAL   LONG TERM GOALS: Target date: 12/19/2021  Patient will report at least 50% improvement in overall symptoms and/or function to demonstrate improved functional mobility  Baseline:  Goal status: INITIAL  2.   Patient will improve FOTO score to predicted value to demonstrate improved functional mobility.  Baseline: 32 Goal status: INITIAL  3.  Patient will improve 5 x STS score from 26 sec to 22 sec to demonstrate improved functional mobility and increased lower extremity strength.  Baseline: 26 sec Goal status: INITIAL  4.  Patient will  be able to stand x 15 min to cook a light meal without back pain greater than 2/10 to increase independence in the home.  Baseline: 5 min Goal status: INITIAL  5.  Patient will be independent with advanced HEP and self management strategies to improve quality of life and functional outcomes.  Baseline:  Goal status: INITIAL   PLAN: PT FREQUENCY: 2x/week  PT DURATION: 4 weeks  PLANNED INTERVENTIONS:  Therapeutic exercises, Therapeutic activity, Neuromuscular re-education, Balance training, Gait training, Patient/Family education, Joint manipulation, Joint mobilization, Stair training, Vestibular training, Canalith repositioning, Visual/preceptual remediation/compensation, Orthotic/Fit training, DME instructions, Aquatic Therapy, Dry Needling, Electrical stimulation, Spinal manipulation, Spinal mobilization, Cryotherapy, Moist heat, Taping, Traction, Ultrasound, Biofeedback, Ionotophoresis '4mg'$ /ml Dexamethasone, Manual therapy, and Re-evaluation.  PLAN FOR NEXT SESSION: review HEP and goals; decompression exercises; postural strengthening, returns to MD 01/01/22   10:42 AM, 11/21/21 Carlester Kasparek Small Aneudy Champlain MPT [facility] [place] [phone]

## 2021-11-26 ENCOUNTER — Encounter (HOSPITAL_COMMUNITY): Payer: Medicare Other | Admitting: Physical Therapy

## 2021-11-29 DIAGNOSIS — K219 Gastro-esophageal reflux disease without esophagitis: Secondary | ICD-10-CM | POA: Diagnosis not present

## 2021-11-29 DIAGNOSIS — G4700 Insomnia, unspecified: Secondary | ICD-10-CM | POA: Diagnosis not present

## 2021-11-29 DIAGNOSIS — G43909 Migraine, unspecified, not intractable, without status migrainosus: Secondary | ICD-10-CM | POA: Diagnosis not present

## 2021-11-29 DIAGNOSIS — R1312 Dysphagia, oropharyngeal phase: Secondary | ICD-10-CM | POA: Diagnosis not present

## 2021-11-29 DIAGNOSIS — N1831 Chronic kidney disease, stage 3a: Secondary | ICD-10-CM | POA: Diagnosis not present

## 2021-11-29 DIAGNOSIS — E039 Hypothyroidism, unspecified: Secondary | ICD-10-CM | POA: Diagnosis not present

## 2021-11-29 DIAGNOSIS — I10 Essential (primary) hypertension: Secondary | ICD-10-CM | POA: Diagnosis not present

## 2021-11-29 DIAGNOSIS — R609 Edema, unspecified: Secondary | ICD-10-CM | POA: Diagnosis not present

## 2021-11-29 DIAGNOSIS — E876 Hypokalemia: Secondary | ICD-10-CM | POA: Diagnosis not present

## 2021-11-29 DIAGNOSIS — K5909 Other constipation: Secondary | ICD-10-CM | POA: Diagnosis not present

## 2021-11-30 ENCOUNTER — Ambulatory Visit (HOSPITAL_COMMUNITY): Payer: Medicare Other | Attending: Gastroenterology

## 2021-11-30 DIAGNOSIS — M546 Pain in thoracic spine: Secondary | ICD-10-CM | POA: Insufficient documentation

## 2021-11-30 DIAGNOSIS — M6281 Muscle weakness (generalized): Secondary | ICD-10-CM | POA: Insufficient documentation

## 2021-11-30 DIAGNOSIS — R262 Difficulty in walking, not elsewhere classified: Secondary | ICD-10-CM | POA: Insufficient documentation

## 2021-11-30 DIAGNOSIS — M545 Low back pain, unspecified: Secondary | ICD-10-CM | POA: Insufficient documentation

## 2021-11-30 NOTE — Therapy (Signed)
OUTPATIENT PHYSICAL THERAPY THORACOLUMBAR EVALUATION   Patient Name: [name] MRN: [id number] DOB:July 31, 1945, 76 y.o., female Today's Date: 11/30/2021   PT End of Session - 11/30/21 0946     Visit Number 2    Number of Visits 8    Date for PT Re-Evaluation 12/19/21    Authorization Type [employer]HC Medicare; no auth required, no co-pay    Authorization Time Period 06/24/21-06-23-2022    Progress Note Due on Visit 10    PT Start Time 0946    PT Stop Time 1025    PT Time Calculation (min) 39 min              Past Medical History:  Diagnosis Date   Adenomatous colon polyp 2006   excised in 2006 & 2010Due surveillance 06/2013   Anxiety    Anxiety and depression    Asthma    Breast mass    right nipple bengn mass per patient   Breast tumor    Chest discomfort    Chronic back pain    Chronic constipation    Complication of anesthesia    Depression    Diverticulosis    Fibromyalgia    Gastroesophageal reflux disease    Hiatal hernia    History of benign esophageal tumor 2010   granular cell esophageal tumor (Dx 06/2008), resected via EMR 2011, due repeat EGD 02/2012   Hyperlipidemia    Hypertension    Insomnia    Migraines    PONV (postoperative nausea and vomiting)    Psychosis ([place])    Sleep apnea    Stop Bang score of 5. Pt said she was told by Dr. Merlene Laughter that she had "a little bit" of sleep apena, but not bad enough to treat.   Thyroid nodule    Tumor of esophagus    Past Surgical History:  Procedure Laterality Date   ABDOMINAL HYSTERECTOMY     BALLOON DILATION  08/23/2021   Procedure: BALLOON DILATION;  Surgeon: Eloise Harman, DO;  [facility];  Service: Endoscopy;;   BIOPSY  10/14/2019   Procedure: BIOPSY;  Surgeon: Daneil Dolin, MD;  [facility];  Service: Endoscopy;;   BRAVO [place] STUDY  12/11/2011   Procedure: BRAVO [place];  Surgeon: Daneil Dolin, MD;  [facility];  Service: Endoscopy;  Laterality: N/A;   BREAST  EXCISIONAL BIOPSY  1990s, 2012   Left x2-sclerosing ductal papilloma-2012   CHOLECYSTECTOMY N/A 04/09/2013   Procedure: LAPAROSCOPIC CHOLECYSTECTOMY;  Surgeon: Jamesetta So, MD;  Location: [facility];  Service: General;  Laterality: N/A;   COLONOSCOPY  06/2008, 06/2011   sigmoid tics, tubular adenoma; 2013: anal canal hemorrhoids   COLONOSCOPY  07/10/2011   Anal canal hemorrhoids likely the cause of hematochezia in the setting of constipation; otherwise normal rectum ;submucosal  petechiae in left colon of doubtful clinical significance; otherwise, normal colon   COLONOSCOPY WITH PROPOFOL N/A 11/10/2017   cancelled in pre-op   COLONOSCOPY WITH PROPOFOL N/A 07/23/2018   Procedure: COLONOSCOPY WITH PROPOFOL;  Surgeon: Daneil Dolin, MD; 1 tubular adenoma, diverticulosis in the sigmoid and descending colon, nonbleeding internal hemorrhoids.  No recommendations to repeat due to age.   ESOPHAGEAL BRUSHING  08/23/2021   Procedure: ESOPHAGEAL BRUSHING;  Surgeon: Eloise Harman, DO;  [facility];  Service: Endoscopy;;   ESOPHAGEAL DILATION N/A 05/08/2015   Procedure: ESOPHAGEAL DILATION;  Surgeon: Daneil Dolin, MD;  Location: [facility];  Service: Endoscopy;  Laterality: N/AVenia Minks 54/56   ESOPHAGOGASTRODUODENOSCOPY  12/11/2011   [id number] Schatzki's ring; otherwise normal/Small hiatal hernia. Antral and bulbar erosions   ESOPHAGOGASTRODUODENOSCOPY (EGD) WITH PROPOFOL N/A 05/08/2015   Dr. Gala Romney: mild erosive reflux esophagitis, non-critical Schatzki's ring s/p dilation. Hiatal hernia.    ESOPHAGOGASTRODUODENOSCOPY (EGD) WITH PROPOFOL N/A 10/14/2019   Procedure: ESOPHAGOGASTRODUODENOSCOPY (EGD) WITH PROPOFOL;  Surgeon: Daneil Dolin, MD;  Nonobstructing Schatzki ring, small hiatal hernia, abnormal gastric mucosa s/p biopsied, normal first and second portion of the duodenum.  Pathology with chronic inactive gastritis, no H. pylori.   ESOPHAGOGASTRODUODENOSCOPY (EGD) WITH PROPOFOL N/A  08/23/2021   Procedure: ESOPHAGOGASTRODUODENOSCOPY (EGD) with or without dilation WITH PROPOFOL;  Surgeon: Eloise Harman, DO;  [facility];  Service: Endoscopy;  Laterality: N/A;   EUS  08/2010   Dr [facility] with EGD. Retained food. No recurrent esophageal lesion, bx negative.   IR KYPHO THORACIC WITH BONE BIOPSY  10/17/2020   IR RADIOLOGIST EVAL & MGMT  09/12/2020   LUNG BIOPSY     negative   PARATHYROIDECTOMY  April 2017   [facility].    PARATHYROIDECTOMY     POLYPECTOMY  07/23/2018   Procedure: POLYPECTOMY;  Surgeon: Daneil Dolin, MD;  [facility];  Service: Endoscopy;;  colon   RIGHT OOPHORECTOMY     benign disease   SHOULDER ARTHROSCOPY     Right; bone spurs removed   TONSILLECTOMY     TOTAL KNEE ARTHROPLASTY  2002   Right; previous arthroscopic surgery   Patient Active Problem List   Diagnosis Date Noted   AKI (acute kidney injury) ([place]) 08/21/2021   Abnormal EKG 08/21/2021   Type 2 diabetes mellitus ([place]) 08/21/2021   Hypokalemia 08/20/2021   Early satiety 08/14/2021   Loss of weight 08/14/2021   Chronic diastolic heart failure ([place]) 04/01/2021   Chronic pain syndrome 04/01/2021   Chronic kidney disease, stage 3a ([place]) 12/21/2020   Neck pain 03/20/2020   Bloating 11/18/2019   Chronic migraine without aura, with status migrainosus 11/04/2019   Mild cognitive disorder 11/04/2019   Dysphagia    H/O adenomatous polyp of colon 09/29/2017   Lower abdominal pain 09/29/2017   Obstructive sleep apnea 09/06/2017   Obesity (BMI 30-39.9) 03/07/2017   Primary osteoarthritis of both hands 03/07/2017   Lower extremity edema 03/03/2017   Restless legs syndrome 01/20/2017   Thyroid nodule 10/17/2016   Pruritus 10/06/2016   Major depressive disorder with psychotic features ([place]) 09/11/2016   Neurocognitive deficits 09/11/2016   (HFpEF) heart failure with preserved ejection fraction ([place]) 07/22/2016   Benzodiazepine withdrawal, with perceptual disturbance  ([place]) 07/22/2016   Urinary frequency 11/07/2015   Abdominal pain 07/26/2015   Hyperparathyroidism ([place]) 06/15/2015   History of depression 06/14/2015   Hypercalcemia 06/13/2015   Major neurocognitive disorder ([place]) 06/13/2015   Confusion 05/29/2015   Odynophagia    Schatzki's ring    Hiatal hernia    Knee contusion 05/02/2014   Acquired trigger finger 10/12/2013   Chronic constipation 07/15/2013   Abdominal pain, epigastric 07/15/2013   LLQ pain 07/15/2013   Lightheaded 01/21/2013   Elevated LFTs 12/26/2012   Insomnia 05/06/2012   History of benign esophageal tumor    Adenomatous colon polyp    DEGENERATIVE DISC DISEASE, LUMBOSACRAL SPINE 05/01/2010   Mixed hyperlipidemia 08/22/2009   THYROID NODULE 08/16/2009   Anxiety and depression 08/16/2009   Essential hypertension 08/16/2009   GERD (gastroesophageal reflux disease) 08/16/2009   FIBROMYALGIA 08/16/2009    PCP:  Edwinna Areola. [place] Crane, MD  REFERRING PROVIDER: Phillips Odor, MD  REFERRING DIAG: LBP  Rationale for Evaluation and Treatment Rehabilitation  THERAPY DIAG:  Low back pain, unspecified back pain laterality, unspecified chronicity, unspecified whether sciatica present  Difficulty in walking, not elsewhere classified  Muscle weakness (generalized)  Pain in thoracic spine  ONSET DATE: 7 months ago  SUBJECTIVE:                                                                                                                                                                                           SUBJECTIVE STATEMENT: Reports headache today 8/10. Usually has a headache daily. Low back kind of hurts. She states she gets shots for her headaches from Dr. Maudry Mayhew office monthly.  She is getting close to time for another injection.   PERTINENT HISTORY:  Right TKA in 2002 Bilateral knee pain; injections every 3 months with Dr. Aline Brochure osteoporosis  PAIN:  Are you having pain? Yes: NPRS scale: 5/10 Pain location:  low back Pain description: aching Aggravating factors: activity Relieving factors: rest, medication   PRECAUTIONS: Fall  WEIGHT BEARING RESTRICTIONS No  FALLS:  Has patient fallen in last 6 months? Yes. Number of falls 3  LIVING ENVIRONMENT: Lives with: lives with their daughter Lives in: House/apartment Stairs: No Has following equipment at home: Programmer, multimedia, Environmental consultant - 2 wheeled, Health visitor, Wheelchair (manual), shower chair, bed side commode, and Grab bars  OCCUPATION: retired Garment/textile technologist  PLOF: Independent; now using a [place]; now daughter assists her with household tasks but she can dress and bathe mod independently  PATIENT GOALS be able to cook without my back hurting   OBJECTIVE:   DIAGNOSTIC FINDINGS:  Scheduled for an x-ray 12/12/20  PATIENT SURVEYS:  FOTO 32  SCREENING FOR RED FLAGS: Bowel or bladder incontinence: Yes: urine only Spinal tumors: No Cauda equina syndrome: No Compression fracture: Yes: hx of previous compression fx Abdominal aneurysm: No  COGNITION:  Overall cognitive status: History of cognitive impairments - at baseline     SENSATION: Reports occasional  numbness in her feet   POSTURE: rounded shoulders, forward head, and increased thoracic kyphosis  PALPATION: Mild tenderness lower thoracic spine spinous processes and paraspinals  LUMBAR ROM:   Active  A/PROM  eval  Flexion wfl  Extension 50% available  Right lateral flexion wfl  Left lateral flexion wfl  Right rotation wfl  Left rotation wfl   (Blank rows = not tested)   LOWER EXTREMITY MMT:    MMT Right eval Left eval  Hip flexion 4+ 4+  Hip extension 3- 4  Hip  abduction 3+ 3+  Hip adduction    Hip internal rotation    Hip external rotation    Knee flexion    Knee extension 4 5  Ankle dorsiflexion 5 5  Ankle plantarflexion    Ankle inversion    Ankle eversion     (Blank rows = not tested)   FUNCTIONAL TESTS:  5 times  sit to stand: 26 sec  GAIT: Distance walked: 60 ft Assistive device utilized: Environmental consultant - 2 wheeled Level of assistance: SBA Comments: slow gait speed; slight forward flexed trunk    TODAY'S TREATMENT  Review of HEP and goals  Supine: Decompression exercises: Head press x 10 Shoulder press x 10 Glute set x 10 Leg press x 10 Leg lengthener x 10  Bridge x 10  LTR x 10  Sitting: Cervical flexion and extension, rotation and side bending x 10 each Cervical retractions x 10   PATIENT EDUCATION:  Education details: updated HEP Person educated: Patient Education method: Explanation, Demonstration, Tactile cues, Verbal cues, and Handouts Education comprehension: verbalized understanding, returned demonstration, verbal cues required, tactile cues required, and needs further education   HOME EXERCISE PROGRAM: 11/30/2021  decompression exercises Access Code: [email or web address] URL: [email or web address] Date: 11/21/2021 Prepared by: [facility]  Exercises - Supine Bridge  - 2 x daily - 7 x weekly - 1 sets - 10 reps - Prone Hip Extension  - 2 x daily - 7 x weekly - 1 sets - 10 reps - Supine Scapular Retraction  - 2 x daily - 7 x weekly - 1 sets - 10 reps  ASSESSMENT:  CLINICAL IMPRESSION: Today's session started with a review of goals set at evaluation and initial HEP. Patient verbalizes agreement with set rehab goals.Added decompression exercises to treatment today to elongate the spine and to strengthen postural muscles.  Educated patient on good posturing as she continues to sit with forward head, rounded shoulders and increased kyphosis.  Added cervical mobility.  Patient needs verbal cues and tactile cues for glute activation and for cervical retraction to be able to perform movement correctly. Patient will benefit from continued skilled therapy interventions to address deficits and improve functional mobility.    OBJECTIVE IMPAIRMENTS Abnormal gait, decreased activity  tolerance, decreased balance, decreased endurance, decreased mobility, difficulty walking, decreased ROM, decreased strength, decreased safety awareness, hypomobility, increased fascial restrictions, impaired perceived functional ability, impaired flexibility, postural dysfunction, and pain.   ACTIVITY LIMITATIONS carrying, lifting, bending, standing, squatting, sleeping, stairs, transfers, reach over head, and locomotion level  PARTICIPATION LIMITATIONS: meal prep, cleaning, laundry, medication management, shopping, community activity, and yard work  PERSONAL FACTORS Age, Fitness, and 1-2 comorbidities: osteoporosis and HBP  are also affecting patient's functional outcome.   REHAB POTENTIAL: Good  CLINICAL DECISION MAKING: Stable/uncomplicated  EVALUATION COMPLEXITY: Low   GOALS: Goals reviewed with patient? No  SHORT TERM GOALS: Target date: 12/14/2021   patient will be independent with initial HEP Baseline: Goal status: ongoing  2.  Patient will increase right hip extension to 3+ to improve patient ability to transfer sit to stand efficiently. Baseline: 3- Goal status: ongoing   LONG TERM GOALS: Target date: 12/28/2021  Patient will report at least 50% improvement in overall symptoms and/or function to demonstrate improved functional mobility  Baseline:  Goal status: ongoing  2.   Patient will improve FOTO score to predicted value to demonstrate improved functional mobility.  Baseline: 32 Goal status: ongoing   3.  Patient will improve 5 x STS score from 26 sec  to 22 sec to demonstrate improved functional mobility and increased lower extremity strength.  Baseline: 26 sec Goal status: ongoing  4.  Patient will be able to stand x 15 min to cook a light meal without back pain greater than 2/10 to increase independence in the home.  Baseline: 5 min Goal status: ongoing  5.  Patient will be independent with advanced HEP and self management strategies to improve quality of  life and functional outcomes.  Baseline:  Goal status: ongoing   PLAN: PT FREQUENCY: 2x/week  PT DURATION: 4 weeks  PLANNED INTERVENTIONS: Therapeutic exercises, Therapeutic activity, Neuromuscular re-education, Balance training, Gait training, Patient/Family education, Joint manipulation, Joint mobilization, Stair training, Vestibular training, Canalith repositioning, Visual/preceptual remediation/compensation, Orthotic/Fit training, DME instructions, Aquatic Therapy, Dry Needling, Electrical stimulation, Spinal manipulation, Spinal mobilization, Cryotherapy, Moist heat, Taping, Traction, Ultrasound, Biofeedback, Ionotophoresis '4mg'$ /ml Dexamethasone, Manual therapy, and Re-evaluation.  PLAN FOR NEXT SESSION: progress core stabilization and postural strengthening as able. returns to MD 01/01/22   10:33 AM, 11/30/21 Nayan Proch Small Jo-Ann Johanning MPT [place] [facility] [place] [phone]

## 2021-12-04 ENCOUNTER — Ambulatory Visit (HOSPITAL_COMMUNITY): Payer: Medicare Other | Admitting: Physical Therapy

## 2021-12-04 DIAGNOSIS — R262 Difficulty in walking, not elsewhere classified: Secondary | ICD-10-CM

## 2021-12-04 DIAGNOSIS — M545 Low back pain, unspecified: Secondary | ICD-10-CM | POA: Diagnosis not present

## 2021-12-04 DIAGNOSIS — M546 Pain in thoracic spine: Secondary | ICD-10-CM | POA: Diagnosis not present

## 2021-12-04 DIAGNOSIS — M6281 Muscle weakness (generalized): Secondary | ICD-10-CM | POA: Diagnosis not present

## 2021-12-04 NOTE — Therapy (Addendum)
OUTPATIENT PHYSICAL THERAPY TREATMENT  Patient Name: [name] MRN: [id number] DOB:Sep 15, 1945, 76 y.o., female Today's Date: 12/04/2021   PT End of Session - 12/04/21 1326     Visit Number 3    Number of Visits 8    Date for PT Re-Evaluation 12/19/21    Authorization Type [employer] Medicare; no auth required, no co-pay    Authorization Time Period 06/24/21-06-23-2022    Progress Note Due on Visit 10    PT Start Time 1320    PT Stop Time 1400    PT Time Calculation (min) 40 min              Past Medical History:  Diagnosis Date   Adenomatous colon polyp 2006   excised in 2006 & 2010Due surveillance 06/2013   Anxiety    Anxiety and depression    Asthma    Breast mass    right nipple bengn mass per patient   Breast tumor    Chest discomfort    Chronic back pain    Chronic constipation    Complication of anesthesia    Depression    Diverticulosis    Fibromyalgia    Gastroesophageal reflux disease    Hiatal hernia    History of benign esophageal tumor 2010   granular cell esophageal tumor (Dx 06/2008), resected via EMR 2011, due repeat EGD 02/2012   Hyperlipidemia    Hypertension    Insomnia    Migraines    PONV (postoperative nausea and vomiting)    Psychosis ([place])    Sleep apnea    Stop Bang score of 5. Pt said she was told by Dr. Merlene Laughter that she had "a little bit" of sleep apena, but not bad enough to treat.   Thyroid nodule    Tumor of esophagus    Past Surgical History:  Procedure Laterality Date   ABDOMINAL HYSTERECTOMY     BALLOON DILATION  08/23/2021   Procedure: BALLOON DILATION;  Surgeon: Eloise Harman, DO;  [facility];  Service: Endoscopy;;   BIOPSY  10/14/2019   Procedure: BIOPSY;  Surgeon: Daneil Dolin, MD;  [facility];  Service: Endoscopy;;   BRAVO [place] STUDY  12/11/2011   Procedure: BRAVO [place];  Surgeon: Daneil Dolin, MD;  [facility];  Service: Endoscopy;  Laterality: N/A;   BREAST EXCISIONAL BIOPSY   1990s, 2012   Left x2-sclerosing ductal papilloma-2012   CHOLECYSTECTOMY N/A 04/09/2013   Procedure: LAPAROSCOPIC CHOLECYSTECTOMY;  Surgeon: Jamesetta So, MD;  Location: [facility];  Service: General;  Laterality: N/A;   COLONOSCOPY  06/2008, 06/2011   sigmoid tics, tubular adenoma; 2013: anal canal hemorrhoids   COLONOSCOPY  07/10/2011   Anal canal hemorrhoids likely the cause of hematochezia in the setting of constipation; otherwise normal rectum ;submucosal  petechiae in left colon of doubtful clinical significance; otherwise, normal colon   COLONOSCOPY WITH PROPOFOL N/A 11/10/2017   cancelled in pre-op   COLONOSCOPY WITH PROPOFOL N/A 07/23/2018   Procedure: COLONOSCOPY WITH PROPOFOL;  Surgeon: Daneil Dolin, MD; 1 tubular adenoma, diverticulosis in the sigmoid and descending colon, nonbleeding internal hemorrhoids.  No recommendations to repeat due to age.   ESOPHAGEAL BRUSHING  08/23/2021   Procedure: ESOPHAGEAL BRUSHING;  Surgeon: Eloise Harman, DO;  [facility];  Service: Endoscopy;;   ESOPHAGEAL DILATION N/A 05/08/2015   Procedure: ESOPHAGEAL DILATION;  Surgeon: Daneil Dolin, MD;  Location: [facility];  Service: Endoscopy;  Laterality:  N/AVenia Minks 54/56   ESOPHAGOGASTRODUODENOSCOPY  12/11/2011   CHY:IFOYDXAJOIN Schatzki's ring; otherwise normal/Small hiatal hernia. Antral and bulbar erosions   ESOPHAGOGASTRODUODENOSCOPY (EGD) WITH PROPOFOL N/A 05/08/2015   Dr. Gala Romney: mild erosive reflux esophagitis, non-critical Schatzki's ring s/p dilation. Hiatal hernia.    ESOPHAGOGASTRODUODENOSCOPY (EGD) WITH PROPOFOL N/A 10/14/2019   Procedure: ESOPHAGOGASTRODUODENOSCOPY (EGD) WITH PROPOFOL;  Surgeon: Daneil Dolin, MD;  Nonobstructing Schatzki ring, small hiatal hernia, abnormal gastric mucosa s/p biopsied, normal first and second portion of the duodenum.  Pathology with chronic inactive gastritis, no H. pylori.   ESOPHAGOGASTRODUODENOSCOPY (EGD) WITH PROPOFOL N/A 08/23/2021   Procedure:  ESOPHAGOGASTRODUODENOSCOPY (EGD) with or without dilation WITH PROPOFOL;  Surgeon: Eloise Harman, DO;  [facility];  Service: Endoscopy;  Laterality: N/A;   EUS  08/2010   Dr [facility] with EGD. Retained food. No recurrent esophageal lesion, bx negative.   IR KYPHO THORACIC WITH BONE BIOPSY  10/17/2020   IR RADIOLOGIST EVAL & MGMT  09/12/2020   LUNG BIOPSY     negative   PARATHYROIDECTOMY  April 2017   [facility].    PARATHYROIDECTOMY     POLYPECTOMY  07/23/2018   Procedure: POLYPECTOMY;  Surgeon: Daneil Dolin, MD;  [facility];  Service: Endoscopy;;  colon   RIGHT OOPHORECTOMY     benign disease   SHOULDER ARTHROSCOPY     Right; bone spurs removed   TONSILLECTOMY     TOTAL KNEE ARTHROPLASTY  2002   Right; previous arthroscopic surgery   Patient Active Problem List   Diagnosis Date Noted   AKI (acute kidney injury) ([place]) 08/21/2021   Abnormal EKG 08/21/2021   Type 2 diabetes mellitus ([place]) 08/21/2021   Hypokalemia 08/20/2021   Early satiety 08/14/2021   Loss of weight 08/14/2021   Chronic diastolic heart failure ([place]) 04/01/2021   Chronic pain syndrome 04/01/2021   Chronic kidney disease, stage 3a ([place]) 12/21/2020   Neck pain 03/20/2020   Bloating 11/18/2019   Chronic migraine without aura, with status migrainosus 11/04/2019   Mild cognitive disorder 11/04/2019   Dysphagia    H/O adenomatous polyp of colon 09/29/2017   Lower abdominal pain 09/29/2017   Obstructive sleep apnea 09/06/2017   Obesity (BMI 30-39.9) 03/07/2017   Primary osteoarthritis of both hands 03/07/2017   Lower extremity edema 03/03/2017   Restless legs syndrome 01/20/2017   Thyroid nodule 10/17/2016   Pruritus 10/06/2016   Major depressive disorder with psychotic features ([place]) 09/11/2016   Neurocognitive deficits 09/11/2016   (HFpEF) heart failure with preserved ejection fraction ([place]) 07/22/2016   Benzodiazepine withdrawal, with perceptual disturbance ([place]) 07/22/2016    Urinary frequency 11/07/2015   Abdominal pain 07/26/2015   Hyperparathyroidism ([place]) 06/15/2015   History of depression 06/14/2015   Hypercalcemia 06/13/2015   Major neurocognitive disorder ([place]) 06/13/2015   Confusion 05/29/2015   Odynophagia    Schatzki's ring    Hiatal hernia    Knee contusion 05/02/2014   Acquired trigger finger 10/12/2013   Chronic constipation 07/15/2013   Abdominal pain, epigastric 07/15/2013   LLQ pain 07/15/2013   Lightheaded 01/21/2013   Elevated LFTs 12/26/2012   Insomnia 05/06/2012   History of benign esophageal tumor    Adenomatous colon polyp    DEGENERATIVE DISC DISEASE, LUMBOSACRAL SPINE 05/01/2010   Mixed hyperlipidemia 08/22/2009   THYROID NODULE 08/16/2009   Anxiety and depression 08/16/2009   Essential hypertension 08/16/2009   GERD (gastroesophageal reflux disease) 08/16/2009   FIBROMYALGIA 08/16/2009    PCP:  Jenny Reichmann  Merlyn Albert, MD  REFERRING PROVIDER: Phillips Odor, MD  REFERRING DIAG: LBP  Rationale for Evaluation and Treatment Rehabilitation  THERAPY DIAG:  Low back pain, unspecified back pain laterality, unspecified chronicity, unspecified whether sciatica present  Difficulty in walking, not elsewhere classified  Muscle weakness (generalized)  ONSET DATE: 7 months ago  SUBJECTIVE:                                                                                                                                                                                           SUBJECTIVE STATEMENT: Reports headache today 8/10 but has these daily. Low back pain remains around a 5/10. Continues to exhibit forward bent posturing.   PERTINENT HISTORY:  Right TKA in 2002 Bilateral knee pain; injections every 3 months with Dr. Aline Brochure osteoporosis  PAIN:  Are you having pain? Yes: NPRS scale: 5/10 Pain location: low back Pain description: aching Aggravating factors: activity Relieving factors: rest, medication   PRECAUTIONS:  Fall  WEIGHT BEARING RESTRICTIONS No  FALLS:  Has patient fallen in last 6 months? Yes. Number of falls 3  LIVING ENVIRONMENT: Lives with: lives with their daughter Lives in: House/apartment Stairs: No Has following equipment at home: Programmer, multimedia, Environmental consultant - 2 wheeled, Health visitor, Wheelchair (manual), shower chair, bed side commode, and Grab bars  OCCUPATION: retired Garment/textile technologist  PLOF: Independent; now using a [place]; now daughter assists her with household tasks but she can dress and bathe mod independently  PATIENT GOALS be able to cook without my back hurting   OBJECTIVE:   DIAGNOSTIC FINDINGS:  Scheduled for an x-ray 12/12/20  PATIENT SURVEYS:  FOTO 32  SCREENING FOR RED FLAGS: Bowel or bladder incontinence: Yes: urine only Spinal tumors: No Cauda equina syndrome: No Compression fracture: Yes: hx of previous compression fx Abdominal aneurysm: No  COGNITION:  Overall cognitive status: History of cognitive impairments - at baseline     SENSATION: Reports occasional  numbness in her feet   POSTURE: rounded shoulders, forward head, and increased thoracic kyphosis  PALPATION: Mild tenderness lower thoracic spine spinous processes and paraspinals  LUMBAR ROM:   Active  A/PROM  eval  Flexion wfl  Extension 50% available  Right lateral flexion wfl  Left lateral flexion wfl  Right rotation wfl  Left rotation wfl   (Blank rows = not tested)   LOWER EXTREMITY MMT:    MMT Right eval Left eval  Hip flexion 4+ 4+  Hip extension 3- 4  Hip abduction 3+ 3+  Hip adduction    Hip internal rotation    Hip external rotation    Knee  flexion    Knee extension 4 5  Ankle dorsiflexion 5 5  Ankle plantarflexion    Ankle inversion    Ankle eversion     (Blank rows = not tested)   FUNCTIONAL TESTS:  5 times sit to stand: 26 sec  GAIT: Distance walked: 60 ft Assistive device utilized: Environmental consultant - 2 wheeled Level of  assistance: SBA Comments: slow gait speed; slight forward flexed trunk    TODAY'S TREATMENT  12/04/21: Supine:  Decompression 2-5 10X 3" holds each  Decompression with RTB 1-4 10X each  Bridge 10X   LTR 10X5"  SLR 10X each Seated:  Wbacks 10X3"   Cervical excursions 5X each  Cervical retractions 10X each  11/30/21: Supine: Decompression exercises: Head press x 10 Shoulder press x 10 Glute set x 10 Leg press x 10 Leg lengthener x 10 Bridge x 10 LTR x 10 Sitting: Cervical flexion and extension, rotation and side bending x 10 each Cervical retractions x 10  11/21/21: Review of HEP and goals   PATIENT EDUCATION:  Education details: 6/13:  maintaining good posture eval: updated HEP Person educated: Patient Education method: Explanation, Demonstration, Tactile cues, Verbal cues, and Handouts Education comprehension: verbalized understanding, returned demonstration, verbal cues required, tactile cues required, and needs further education   HOME EXERCISE PROGRAM: 12/04/21:  decompression tband exercises with RTB 11/30/2021:  decompression exercises Evaluation: Access Code: [email or web address] URL: [email or web address] Date: 11/21/2021 Prepared by: [facility]  Exercises - Supine Bridge  - 2 x daily - 7 x weekly - 1 sets - 10 reps - Prone Hip Extension  - 2 x daily - 7 x weekly - 1 sets - 10 reps - Supine Scapular Retraction  - 2 x daily - 7 x weekly - 1 sets - 10 reps  ASSESSMENT:  CLINICAL IMPRESSION: Continued with decompression and core stability therex to improve lumbar pain and dysfunction. Progressed to decompression exercises using red theraband today.  Continued with education on good posturing as she continues to sit with forward head, rounded shoulders and increased kyphosis.  Added Wbacks to help improve upon this and continued with cervical mobility. Pt reports reduced LBP at end of session.  Patient needs verbal cues and tactile cues for core and  glute  activation with therex. Patient will benefit from continued skilled therapy interventions to address deficits and improve functional mobility.    OBJECTIVE IMPAIRMENTS Abnormal gait, decreased activity tolerance, decreased balance, decreased endurance, decreased mobility, difficulty walking, decreased ROM, decreased strength, decreased safety awareness, hypomobility, increased fascial restrictions, impaired perceived functional ability, impaired flexibility, postural dysfunction, and pain.   ACTIVITY LIMITATIONS carrying, lifting, bending, standing, squatting, sleeping, stairs, transfers, reach over head, and locomotion level  PARTICIPATION LIMITATIONS: meal prep, cleaning, laundry, medication management, shopping, community activity, and yard work  PERSONAL FACTORS Age, Fitness, and 1-2 comorbidities: osteoporosis and HBP  are also affecting patient's functional outcome.   REHAB POTENTIAL: Good  CLINICAL DECISION MAKING: Stable/uncomplicated  EVALUATION COMPLEXITY: Low   GOALS: Goals reviewed with patient? No  SHORT TERM GOALS: Target date: 12/14/2021   patient will be independent with initial HEP Baseline: Goal status: ongoing  2.  Patient will increase right hip extension to 3+ to improve patient ability to transfer sit to stand efficiently. Baseline: 3- Goal status: ongoing   LONG TERM GOALS: Target date: 12/28/2021  Patient will report at least 50% improvement in overall symptoms and/or function to demonstrate improved functional mobility  Baseline:  Goal status: ongoing  2.   Patient  will improve FOTO score to predicted value to demonstrate improved functional mobility.  Baseline: 32 Goal status: ongoing   3.  Patient will improve 5 x STS score from 26 sec to 22 sec to demonstrate improved functional mobility and increased lower extremity strength.  Baseline: 26 sec Goal status: ongoing  4.  Patient will be able to stand x 15 min to cook a light meal without back  pain greater than 2/10 to increase independence in the home.  Baseline: 5 min Goal status: ongoing  5.  Patient will be independent with advanced HEP and self management strategies to improve quality of life and functional outcomes.  Baseline:  Goal status: ongoing   PLAN: PT FREQUENCY: 2x/week  PT DURATION: 4 weeks  PLANNED INTERVENTIONS: Therapeutic exercises, Therapeutic activity, Neuromuscular re-education, Balance training, Gait training, Patient/Family education, Joint manipulation, Joint mobilization, Stair training, Vestibular training, Canalith repositioning, Visual/preceptual remediation/compensation, Orthotic/Fit training, DME instructions, Aquatic Therapy, Dry Needling, Electrical stimulation, Spinal manipulation, Spinal mobilization, Cryotherapy, Moist heat, Taping, Traction, Ultrasound, Biofeedback, Ionotophoresis '4mg'$ /ml Dexamethasone, Manual therapy, and Re-evaluation.  PLAN FOR NEXT SESSION: progress core stabilization and postural strengthening as able. returns to MD 01/01/22   1:26 PM, 12/04/21 Teena Irani, PTA/CLT [place] Ph: [phone]

## 2021-12-05 DIAGNOSIS — G43711 Chronic migraine without aura, intractable, with status migrainosus: Secondary | ICD-10-CM | POA: Diagnosis not present

## 2021-12-06 ENCOUNTER — Ambulatory Visit (HOSPITAL_COMMUNITY): Payer: Medicare Other | Attending: Neurology | Admitting: Physical Therapy

## 2021-12-06 DIAGNOSIS — R262 Difficulty in walking, not elsewhere classified: Secondary | ICD-10-CM | POA: Diagnosis not present

## 2021-12-06 DIAGNOSIS — M6281 Muscle weakness (generalized): Secondary | ICD-10-CM | POA: Diagnosis not present

## 2021-12-06 DIAGNOSIS — M545 Low back pain, unspecified: Secondary | ICD-10-CM | POA: Diagnosis not present

## 2021-12-06 NOTE — Therapy (Signed)
OUTPATIENT PHYSICAL THERAPY TREATMENT   Patient Name: [name] MRN: [id number] DOB:01-07-1946, 76 y.o., female Today's Date: 12/06/2021   PT End of Session - 12/06/21 1008     Visit Number 4    Number of Visits 8    Date for PT Re-Evaluation 12/19/21    Authorization Type [employer]HC Medicare; no auth required, no co-pay    Authorization Time Period 06/24/21-06-23-2022    Progress Note Due on Visit 10    PT Start Time 1001    PT Stop Time 1042    PT Time Calculation (min) 41 min              Past Medical History:  Diagnosis Date   Adenomatous colon polyp 2006   excised in 2006 & 2010Due surveillance 06/2013   Anxiety    Anxiety and depression    Asthma    Breast mass    right nipple bengn mass per patient   Breast tumor    Chest discomfort    Chronic back pain    Chronic constipation    Complication of anesthesia    Depression    Diverticulosis    Fibromyalgia    Gastroesophageal reflux disease    Hiatal hernia    History of benign esophageal tumor 2010   granular cell esophageal tumor (Dx 06/2008), resected via EMR 2011, due repeat EGD 02/2012   Hyperlipidemia    Hypertension    Insomnia    Migraines    PONV (postoperative nausea and vomiting)    Psychosis ([place])    Sleep apnea    Stop Bang score of 5. Pt said she was told by Dr. Merlene Laughter that she had "a little bit" of sleep apena, but not bad enough to treat.   Thyroid nodule    Tumor of esophagus    Past Surgical History:  Procedure Laterality Date   ABDOMINAL HYSTERECTOMY     BALLOON DILATION  08/23/2021   Procedure: BALLOON DILATION;  Surgeon: Eloise Harman, DO;  [facility];  Service: Endoscopy;;   BIOPSY  10/14/2019   Procedure: BIOPSY;  Surgeon: Daneil Dolin, MD;  [facility];  Service: Endoscopy;;   BRAVO [place] STUDY  12/11/2011   Procedure: BRAVO [place];  Surgeon: Daneil Dolin, MD;  [facility];  Service: Endoscopy;  Laterality: N/A;   BREAST EXCISIONAL  BIOPSY  1990s, 2012   Left x2-sclerosing ductal papilloma-2012   CHOLECYSTECTOMY N/A 04/09/2013   Procedure: LAPAROSCOPIC CHOLECYSTECTOMY;  Surgeon: Jamesetta So, MD;  Location: [facility];  Service: General;  Laterality: N/A;   COLONOSCOPY  06/2008, 06/2011   sigmoid tics, tubular adenoma; 2013: anal canal hemorrhoids   COLONOSCOPY  07/10/2011   Anal canal hemorrhoids likely the cause of hematochezia in the setting of constipation; otherwise normal rectum ;submucosal  petechiae in left colon of doubtful clinical significance; otherwise, normal colon   COLONOSCOPY WITH PROPOFOL N/A 11/10/2017   cancelled in pre-op   COLONOSCOPY WITH PROPOFOL N/A 07/23/2018   Procedure: COLONOSCOPY WITH PROPOFOL;  Surgeon: Daneil Dolin, MD; 1 tubular adenoma, diverticulosis in the sigmoid and descending colon, nonbleeding internal hemorrhoids.  No recommendations to repeat due to age.   ESOPHAGEAL BRUSHING  08/23/2021   Procedure: ESOPHAGEAL BRUSHING;  Surgeon: Eloise Harman, DO;  [facility];  Service: Endoscopy;;   ESOPHAGEAL DILATION N/A 05/08/2015   Procedure: ESOPHAGEAL DILATION;  Surgeon: Daneil Dolin, MD;  Location: [facility];  Service: Endoscopy;  Laterality: N/AVenia Minks 54/56   ESOPHAGOGASTRODUODENOSCOPY  12/11/2011   PFX:[id number] Schatzki's ring; otherwise normal/Small hiatal hernia. Antral and bulbar erosions   ESOPHAGOGASTRODUODENOSCOPY (EGD) WITH PROPOFOL N/A 05/08/2015   Dr. Gala Romney: mild erosive reflux esophagitis, non-critical Schatzki's ring s/p dilation. Hiatal hernia.    ESOPHAGOGASTRODUODENOSCOPY (EGD) WITH PROPOFOL N/A 10/14/2019   Procedure: ESOPHAGOGASTRODUODENOSCOPY (EGD) WITH PROPOFOL;  Surgeon: Daneil Dolin, MD;  Nonobstructing Schatzki ring, small hiatal hernia, abnormal gastric mucosa s/p biopsied, normal first and second portion of the duodenum.  Pathology with chronic inactive gastritis, no H. pylori.   ESOPHAGOGASTRODUODENOSCOPY (EGD) WITH PROPOFOL N/A 08/23/2021    Procedure: ESOPHAGOGASTRODUODENOSCOPY (EGD) with or without dilation WITH PROPOFOL;  Surgeon: Eloise Harman, DO;  [facility];  Service: Endoscopy;  Laterality: N/A;   EUS  08/2010   Dr [facility] with EGD. Retained food. No recurrent esophageal lesion, bx negative.   IR KYPHO THORACIC WITH BONE BIOPSY  10/17/2020   IR RADIOLOGIST EVAL & MGMT  09/12/2020   LUNG BIOPSY     negative   PARATHYROIDECTOMY  April 2017   [facility].    PARATHYROIDECTOMY     POLYPECTOMY  07/23/2018   Procedure: POLYPECTOMY;  Surgeon: Daneil Dolin, MD;  [facility];  Service: Endoscopy;;  colon   RIGHT OOPHORECTOMY     benign disease   SHOULDER ARTHROSCOPY     Right; bone spurs removed   TONSILLECTOMY     TOTAL KNEE ARTHROPLASTY  2002   Right; previous arthroscopic surgery   Patient Active Problem List   Diagnosis Date Noted   AKI (acute kidney injury) ([place]) 08/21/2021   Abnormal EKG 08/21/2021   Type 2 diabetes mellitus ([place]) 08/21/2021   Hypokalemia 08/20/2021   Early satiety 08/14/2021   Loss of weight 08/14/2021   Chronic diastolic heart failure ([place]) 04/01/2021   Chronic pain syndrome 04/01/2021   Chronic kidney disease, stage 3a ([place]) 12/21/2020   Neck pain 03/20/2020   Bloating 11/18/2019   Chronic migraine without aura, with status migrainosus 11/04/2019   Mild cognitive disorder 11/04/2019   Dysphagia    H/O adenomatous polyp of colon 09/29/2017   Lower abdominal pain 09/29/2017   Obstructive sleep apnea 09/06/2017   Obesity (BMI 30-39.9) 03/07/2017   Primary osteoarthritis of both hands 03/07/2017   Lower extremity edema 03/03/2017   Restless legs syndrome 01/20/2017   Thyroid nodule 10/17/2016   Pruritus 10/06/2016   Major depressive disorder with psychotic features ([place]) 09/11/2016   Neurocognitive deficits 09/11/2016   (HFpEF) heart failure with preserved ejection fraction ([place]) 07/22/2016   Benzodiazepine withdrawal, with perceptual disturbance ([place])  07/22/2016   Urinary frequency 11/07/2015   Abdominal pain 07/26/2015   Hyperparathyroidism ([place]) 06/15/2015   History of depression 06/14/2015   Hypercalcemia 06/13/2015   Major neurocognitive disorder ([place]) 06/13/2015   Confusion 05/29/2015   Odynophagia    Schatzki's ring    Hiatal hernia    Knee contusion 05/02/2014   Acquired trigger finger 10/12/2013   Chronic constipation 07/15/2013   Abdominal pain, epigastric 07/15/2013   LLQ pain 07/15/2013   Lightheaded 01/21/2013   Elevated LFTs 12/26/2012   Insomnia 05/06/2012   History of benign esophageal tumor    Adenomatous colon polyp    DEGENERATIVE DISC DISEASE, LUMBOSACRAL SPINE 05/01/2010   Mixed hyperlipidemia 08/22/2009   THYROID NODULE 08/16/2009   Anxiety and depression 08/16/2009   Essential hypertension 08/16/2009   GERD (gastroesophageal reflux disease) 08/16/2009   FIBROMYALGIA 08/16/2009    PCP:  Edwinna Areola. [place] Crane, MD  REFERRING PROVIDER: Phillips Odor, MD  REFERRING DIAG: LBP  Rationale for Evaluation and Treatment Rehabilitation  THERAPY DIAG:  Low back pain, unspecified back pain laterality, unspecified chronicity, unspecified whether sciatica present  Difficulty in walking, not elsewhere classified  Muscle weakness (generalized)  ONSET DATE: 7 months ago  SUBJECTIVE:                                                                                                                                                                                           SUBJECTIVE STATEMENT: Reports her Lt knee is hurting bad today, don't know if weather related.  Rates 10/10 but denies need to go to ED.  No headache today or back pain.    PERTINENT HISTORY:  Right TKA in 2002 Bilateral knee pain; injections every 3 months with Dr. Aline Brochure osteoporosis  PAIN:  Are you having pain? Yes: NPRS scale: 10/10 Pain location: Lt knee Pain description: aching Aggravating factors: activity Relieving factors: rest,  medication   PRECAUTIONS: Fall  WEIGHT BEARING RESTRICTIONS No  FALLS:  Has patient fallen in last 6 months? Yes. Number of falls 3  LIVING ENVIRONMENT: Lives with: lives with their daughter Lives in: House/apartment Stairs: No Has following equipment at home: Programmer, multimedia, Environmental consultant - 2 wheeled, Health visitor, Wheelchair (manual), shower chair, bed side commode, and Grab bars  OCCUPATION: retired Garment/textile technologist  PLOF: Independent; now using a [place]; now daughter assists her with household tasks but she can dress and bathe mod independently  PATIENT GOALS be able to cook without my back hurting   OBJECTIVE:   DIAGNOSTIC FINDINGS:  Scheduled for an x-ray 12/12/20  PATIENT SURVEYS:  FOTO 32  SCREENING FOR RED FLAGS: Bowel or bladder incontinence: Yes: urine only Spinal tumors: No Cauda equina syndrome: No Compression fracture: Yes: hx of previous compression fx Abdominal aneurysm: No  COGNITION:  Overall cognitive status: History of cognitive impairments - at baseline     SENSATION: Reports occasional  numbness in her feet   POSTURE: rounded shoulders, forward head, and increased thoracic kyphosis  PALPATION: Mild tenderness lower thoracic spine spinous processes and paraspinals  LUMBAR ROM:   Active  A/PROM  eval  Flexion wfl  Extension 50% available  Right lateral flexion wfl  Left lateral flexion wfl  Right rotation wfl  Left rotation wfl   (Blank rows = not tested)   LOWER EXTREMITY MMT:    MMT Right eval Left eval  Hip flexion 4+ 4+  Hip extension 3- 4  Hip abduction 3+ 3+  Hip adduction    Hip internal  rotation    Hip external rotation    Knee flexion    Knee extension 4 5  Ankle dorsiflexion 5 5  Ankle plantarflexion    Ankle inversion    Ankle eversion     (Blank rows = not tested)   FUNCTIONAL TESTS:  5 times sit to stand: 26 sec  GAIT: Distance walked: 60 ft Assistive device utilized: Environmental consultant - 2  wheeled Level of assistance: SBA Comments: slow gait speed; slight forward flexed trunk    TODAY'S TREATMENT  12/06/21: Supine:  Decompression 2-5 10X 3" holds each  Decompression with RTB 1-4 10X each  Bridge 10X 2 sets  LTR 10X5"  SLR 10X each 2 sets Seated:  Wbacks 10X3"   Cervical excursions 10X each  Cervical retractions 10X each  12/04/21: Supine:  Decompression 2-5 10X 3" holds each  Decompression with RTB 1-4 10X each  Bridge 10X   LTR 10X5"  SLR 10X each Seated:  Wbacks 10X3"   Cervical excursions 5X each  Cervical retractions 10X each  11/30/21: Supine: Decompression exercises: Head press x 10 Shoulder press x 10 Glute set x 10 Leg press x 10 Leg lengthener x 10 Bridge x 10 LTR x 10 Sitting: Cervical flexion and extension, rotation and side bending x 10 each Cervical retractions x 10  11/21/21: Review of HEP and goals   PATIENT EDUCATION:  Education details: 6/13:  maintaining good posture eval: updated HEP Person educated: Patient Education method: Explanation, Demonstration, Tactile cues, Verbal cues, and Handouts Education comprehension: verbalized understanding, returned demonstration, verbal cues required, tactile cues required, and needs further education   HOME EXERCISE PROGRAM: 12/04/21:  decompression tband exercises with RTB 11/30/2021:  decompression exercises Evaluation: Access Code: [email or web address] URL: [email or web address] Date: 11/21/2021 Prepared by: [facility]  Exercises - Supine Bridge  - 2 x daily - 7 x weekly - 1 sets - 10 reps - Prone Hip Extension  - 2 x daily - 7 x weekly - 1 sets - 10 reps - Supine Scapular Retraction  - 2 x daily - 7 x weekly - 1 sets - 10 reps  ASSESSMENT:  CLINICAL IMPRESSION: Continued with decompression and core stability therex as she is still not independent with these, requiring cues. Increased sets of bridge and SLR this session.  Less cues needed this session with seated posturing. No LBP  or cervical pain today but voices increased Lt knee pain possibly from exercises last session.  Therapist does not see how therex being completed at this time could aggravate knee pain as all NWB.   Continued cues needed for core and  glute activation with therex. Patient will benefit from continued skilled therapy interventions to address deficits and improve functional mobility.    OBJECTIVE IMPAIRMENTS Abnormal gait, decreased activity tolerance, decreased balance, decreased endurance, decreased mobility, difficulty walking, decreased ROM, decreased strength, decreased safety awareness, hypomobility, increased fascial restrictions, impaired perceived functional ability, impaired flexibility, postural dysfunction, and pain.   ACTIVITY LIMITATIONS carrying, lifting, bending, standing, squatting, sleeping, stairs, transfers, reach over head, and locomotion level  PARTICIPATION LIMITATIONS: meal prep, cleaning, laundry, medication management, shopping, community activity, and yard work  PERSONAL FACTORS Age, Fitness, and 1-2 comorbidities: osteoporosis and HBP  are also affecting patient's functional outcome.   REHAB POTENTIAL: Good  CLINICAL DECISION MAKING: Stable/uncomplicated  EVALUATION COMPLEXITY: Low   GOALS: Goals reviewed with patient? No  SHORT TERM GOALS: Target date: 12/14/2021   patient will be independent with initial HEP Baseline: Goal status: ongoing  2.  Patient will increase right hip extension to 3+ to improve patient ability to transfer sit to stand efficiently. Baseline: 3- Goal status: ongoing   LONG TERM GOALS: Target date: 12/28/2021  Patient will report at least 50% improvement in overall symptoms and/or function to demonstrate improved functional mobility  Baseline:  Goal status: ongoing  2.   Patient will improve FOTO score to predicted value to demonstrate improved functional mobility.  Baseline: 32 Goal status: ongoing   3.  Patient will improve 5  x STS score from 26 sec to 22 sec to demonstrate improved functional mobility and increased lower extremity strength.  Baseline: 26 sec Goal status: ongoing  4.  Patient will be able to stand x 15 min to cook a light meal without back pain greater than 2/10 to increase independence in the home.  Baseline: 5 min Goal status: ongoing  5.  Patient will be independent with advanced HEP and self management strategies to improve quality of life and functional outcomes.  Baseline:  Goal status: ongoing   PLAN: PT FREQUENCY: 2x/week  PT DURATION: 4 weeks  PLANNED INTERVENTIONS: Therapeutic exercises, Therapeutic activity, Neuromuscular re-education, Balance training, Gait training, Patient/Family education, Joint manipulation, Joint mobilization, Stair training, Vestibular training, Canalith repositioning, Visual/preceptual remediation/compensation, Orthotic/Fit training, DME instructions, Aquatic Therapy, Dry Needling, Electrical stimulation, Spinal manipulation, Spinal mobilization, Cryotherapy, Moist heat, Taping, Traction, Ultrasound, Biofeedback, Ionotophoresis '4mg'$ /ml Dexamethasone, Manual therapy, and Re-evaluation.  PLAN FOR NEXT SESSION: progress core stabilization and postural strengthening as able. returns to MD 01/01/22   10:08 AM, 12/06/21 Teena Irani, PTA/CLT [place] Ph: [phone]

## 2021-12-11 ENCOUNTER — Ambulatory Visit (HOSPITAL_COMMUNITY): Payer: Medicare Other | Attending: Internal Medicine

## 2021-12-11 DIAGNOSIS — R293 Abnormal posture: Secondary | ICD-10-CM | POA: Insufficient documentation

## 2021-12-11 DIAGNOSIS — M6281 Muscle weakness (generalized): Secondary | ICD-10-CM

## 2021-12-11 DIAGNOSIS — R269 Unspecified abnormalities of gait and mobility: Secondary | ICD-10-CM | POA: Diagnosis not present

## 2021-12-11 DIAGNOSIS — M545 Low back pain, unspecified: Secondary | ICD-10-CM

## 2021-12-11 DIAGNOSIS — R262 Difficulty in walking, not elsewhere classified: Secondary | ICD-10-CM

## 2021-12-11 DIAGNOSIS — M546 Pain in thoracic spine: Secondary | ICD-10-CM

## 2021-12-11 NOTE — Therapy (Signed)
OUTPATIENT PHYSICAL THERAPY TREATMENT   Patient Name: [name] MRN: [id number] DOB:Oct 08, 1945, 76 y.o., female Today's Date: 12/11/2021   PT End of Session - 12/11/21 1028     Visit Number 5    Number of Visits 8    Date for PT Re-Evaluation 12/19/21    Authorization Type [employer] Medicare; no auth required, no co-pay    Authorization Time Period 06/24/21-06-23-2022    Progress Note Due on Visit 10    PT Start Time 1027    PT Stop Time 1112    PT Time Calculation (min) 45 min               Past Medical History:  Diagnosis Date   Adenomatous colon polyp 2006   excised in 2006 & 2010Due surveillance 06/2013   Anxiety    Anxiety and depression    Asthma    Breast mass    right nipple bengn mass per patient   Breast tumor    Chest discomfort    Chronic back pain    Chronic constipation    Complication of anesthesia    Depression    Diverticulosis    Fibromyalgia    Gastroesophageal reflux disease    Hiatal hernia    History of benign esophageal tumor 2010   granular cell esophageal tumor (Dx 06/2008), resected via EMR 2011, due repeat EGD 02/2012   Hyperlipidemia    Hypertension    Insomnia    Migraines    PONV (postoperative nausea and vomiting)    Psychosis ([place])    Sleep apnea    Stop Bang score of 5. Pt said she was told by Dr. Merlene Laughter that she had "a little bit" of sleep apena, but not bad enough to treat.   Thyroid nodule    Tumor of esophagus    Past Surgical History:  Procedure Laterality Date   ABDOMINAL HYSTERECTOMY     BALLOON DILATION  08/23/2021   Procedure: BALLOON DILATION;  Surgeon: Eloise Harman, DO;  [facility];  Service: Endoscopy;;   BIOPSY  10/14/2019   Procedure: BIOPSY;  Surgeon: Daneil Dolin, MD;  [facility];  Service: Endoscopy;;   BRAVO [place] STUDY  12/11/2011   Procedure: BRAVO [place];  Surgeon: Daneil Dolin, MD;  [facility];  Service: Endoscopy;  Laterality: N/A;   BREAST EXCISIONAL  BIOPSY  1990s, 2012   Left x2-sclerosing ductal papilloma-2012   CHOLECYSTECTOMY N/A 04/09/2013   Procedure: LAPAROSCOPIC CHOLECYSTECTOMY;  Surgeon: Jamesetta So, MD;  Location: [facility];  Service: General;  Laterality: N/A;   COLONOSCOPY  06/2008, 06/2011   sigmoid tics, tubular adenoma; 2013: anal canal hemorrhoids   COLONOSCOPY  07/10/2011   Anal canal hemorrhoids likely the cause of hematochezia in the setting of constipation; otherwise normal rectum ;submucosal  petechiae in left colon of doubtful clinical significance; otherwise, normal colon   COLONOSCOPY WITH PROPOFOL N/A 11/10/2017   cancelled in pre-op   COLONOSCOPY WITH PROPOFOL N/A 07/23/2018   Procedure: COLONOSCOPY WITH PROPOFOL;  Surgeon: Daneil Dolin, MD; 1 tubular adenoma, diverticulosis in the sigmoid and descending colon, nonbleeding internal hemorrhoids.  No recommendations to repeat due to age.   ESOPHAGEAL BRUSHING  08/23/2021   Procedure: ESOPHAGEAL BRUSHING;  Surgeon: Eloise Harman, DO;  [facility];  Service: Endoscopy;;   ESOPHAGEAL DILATION N/A 05/08/2015   Procedure: ESOPHAGEAL DILATION;  Surgeon: Daneil Dolin, MD;  Location: [facility];  Service: Endoscopy;  Laterality: N/AVenia Minks 54/56   ESOPHAGOGASTRODUODENOSCOPY  12/11/2011   BOF:[id number] Schatzki's ring; otherwise normal/Small hiatal hernia. Antral and bulbar erosions   ESOPHAGOGASTRODUODENOSCOPY (EGD) WITH PROPOFOL N/A 05/08/2015   Dr. Gala Romney: mild erosive reflux esophagitis, non-critical Schatzki's ring s/p dilation. Hiatal hernia.    ESOPHAGOGASTRODUODENOSCOPY (EGD) WITH PROPOFOL N/A 10/14/2019   Procedure: ESOPHAGOGASTRODUODENOSCOPY (EGD) WITH PROPOFOL;  Surgeon: Daneil Dolin, MD;  Nonobstructing Schatzki ring, small hiatal hernia, abnormal gastric mucosa s/p biopsied, normal first and second portion of the duodenum.  Pathology with chronic inactive gastritis, no H. pylori.   ESOPHAGOGASTRODUODENOSCOPY (EGD) WITH PROPOFOL N/A 08/23/2021    Procedure: ESOPHAGOGASTRODUODENOSCOPY (EGD) with or without dilation WITH PROPOFOL;  Surgeon: Eloise Harman, DO;  [facility];  Service: Endoscopy;  Laterality: N/A;   EUS  08/2010   Dr [facility] with EGD. Retained food. No recurrent esophageal lesion, bx negative.   IR KYPHO THORACIC WITH BONE BIOPSY  10/17/2020   IR RADIOLOGIST EVAL & MGMT  09/12/2020   LUNG BIOPSY     negative   PARATHYROIDECTOMY  April 2017   [facility].    PARATHYROIDECTOMY     POLYPECTOMY  07/23/2018   Procedure: POLYPECTOMY;  Surgeon: Daneil Dolin, MD;  [facility];  Service: Endoscopy;;  colon   RIGHT OOPHORECTOMY     benign disease   SHOULDER ARTHROSCOPY     Right; bone spurs removed   TONSILLECTOMY     TOTAL KNEE ARTHROPLASTY  2002   Right; previous arthroscopic surgery   Patient Active Problem List   Diagnosis Date Noted   AKI (acute kidney injury) ([place]) 08/21/2021   Abnormal EKG 08/21/2021   Type 2 diabetes mellitus ([place]) 08/21/2021   Hypokalemia 08/20/2021   Early satiety 08/14/2021   Loss of weight 08/14/2021   Chronic diastolic heart failure ([place]) 04/01/2021   Chronic pain syndrome 04/01/2021   Chronic kidney disease, stage 3a ([place]) 12/21/2020   Neck pain 03/20/2020   Bloating 11/18/2019   Chronic migraine without aura, with status migrainosus 11/04/2019   Mild cognitive disorder 11/04/2019   Dysphagia    H/O adenomatous polyp of colon 09/29/2017   Lower abdominal pain 09/29/2017   Obstructive sleep apnea 09/06/2017   Obesity (BMI 30-39.9) 03/07/2017   Primary osteoarthritis of both hands 03/07/2017   Lower extremity edema 03/03/2017   Restless legs syndrome 01/20/2017   Thyroid nodule 10/17/2016   Pruritus 10/06/2016   Major depressive disorder with psychotic features ([place]) 09/11/2016   Neurocognitive deficits 09/11/2016   (HFpEF) heart failure with preserved ejection fraction ([place]) 07/22/2016   Benzodiazepine withdrawal, with perceptual disturbance ([place])  07/22/2016   Urinary frequency 11/07/2015   Abdominal pain 07/26/2015   Hyperparathyroidism ([place]) 06/15/2015   History of depression 06/14/2015   Hypercalcemia 06/13/2015   Major neurocognitive disorder ([place]) 06/13/2015   Confusion 05/29/2015   Odynophagia    Schatzki's ring    Hiatal hernia    Knee contusion 05/02/2014   Acquired trigger finger 10/12/2013   Chronic constipation 07/15/2013   Abdominal pain, epigastric 07/15/2013   LLQ pain 07/15/2013   Lightheaded 01/21/2013   Elevated LFTs 12/26/2012   Insomnia 05/06/2012   History of benign esophageal tumor    Adenomatous colon polyp    DEGENERATIVE DISC DISEASE, LUMBOSACRAL SPINE 05/01/2010   Mixed hyperlipidemia 08/22/2009   THYROID NODULE 08/16/2009   Anxiety and depression 08/16/2009   Essential hypertension 08/16/2009   GERD (gastroesophageal reflux disease) 08/16/2009   FIBROMYALGIA 08/16/2009    PCP:  Edwinna Areola. [place] Crane, MD  REFERRING PROVIDER: Phillips Odor, MD  REFERRING DIAG: LBP  Rationale for Evaluation and Treatment Rehabilitation  THERAPY DIAG:  Low back pain, unspecified back pain laterality, unspecified chronicity, unspecified whether sciatica present  Pain in thoracic spine  Difficulty in walking, not elsewhere classified  Muscle weakness (generalized)  ONSET DATE: 7 months ago  SUBJECTIVE:                                                                                                                                                                                           SUBJECTIVE STATEMENT: Patient reports knees are bothering her quite a bit today ; supposed to get another injection in knee left in august   PERTINENT HISTORY:  Right TKA in 2002 Bilateral knee pain; injections every 3 months with Dr. Aline Brochure osteoporosis  PAIN:  Are you having pain? Yes: NPRS scale: 7/10 Pain location: Lt knee Pain description: aching Aggravating factors: activity Relieving factors: rest,  medication   PRECAUTIONS: Fall  WEIGHT BEARING RESTRICTIONS No  FALLS:  Has patient fallen in last 6 months? Yes. Number of falls 3  LIVING ENVIRONMENT: Lives with: lives with their daughter Lives in: House/apartment Stairs: No Has following equipment at home: Programmer, multimedia, Environmental consultant - 2 wheeled, Health visitor, Wheelchair (manual), shower chair, bed side commode, and Grab bars  OCCUPATION: retired Garment/textile technologist  PLOF: Independent; now using a [place]; now daughter assists her with household tasks but she can dress and bathe mod independently  PATIENT GOALS be able to cook without my back hurting   OBJECTIVE:   DIAGNOSTIC FINDINGS:  Scheduled for an x-ray 12/12/20  PATIENT SURVEYS:  FOTO 32  SCREENING FOR RED FLAGS: Bowel or bladder incontinence: Yes: urine only Spinal tumors: No Cauda equina syndrome: No Compression fracture: Yes: hx of previous compression fx Abdominal aneurysm: No  COGNITION:  Overall cognitive status: History of cognitive impairments - at baseline     SENSATION: Reports occasional  numbness in her feet   POSTURE: rounded shoulders, forward head, and increased thoracic kyphosis  PALPATION: Mild tenderness lower thoracic spine spinous processes and paraspinals  LUMBAR ROM:   Active  A/PROM  eval  Flexion wfl  Extension 50% available  Right lateral flexion wfl  Left lateral flexion wfl  Right rotation wfl  Left rotation wfl   (Blank rows = not tested)   LOWER EXTREMITY MMT:    MMT Right eval Left eval  Hip flexion 4+ 4+  Hip extension 3- 4  Hip abduction 3+ 3+  Hip adduction    Hip internal rotation    Hip  external rotation    Knee flexion    Knee extension 4 5  Ankle dorsiflexion 5 5  Ankle plantarflexion    Ankle inversion    Ankle eversion     (Blank rows = not tested)   FUNCTIONAL TESTS:  5 times sit to stand: 26 sec  GAIT: Distance walked: 60 ft Assistive device utilized: Environmental consultant - 2  wheeled Level of assistance: SBA Comments: slow gait speed; slight forward flexed trunk    TODAY'S TREATMENT  12/11/21 Supine: Decompression exercises 10 x 5" hold Head press Shoulder press Leg lengthener Leg press  LTR x 10 Glute squeeze 5" hold x 10 Bridge x 10 SLR x 10   Sitting: Cervical retractions x 10 Scapular retractionx x 10    Scapular retraction with RTB 2 x 10    12/06/21: Supine:  Decompression 2-5 10X 3" holds each  Decompression with RTB 1-4 10X each  Bridge 10X 2 sets  LTR 10X5"  SLR 10X each 2 sets Seated:  Wbacks 10X3"   Cervical excursions 10X each  Cervical retractions 10X each  12/04/21: Supine:  Decompression 2-5 10X 3" holds each  Decompression with RTB 1-4 10X each  Bridge 10X   LTR 10X5"  SLR 10X each Seated:  Wbacks 10X3"   Cervical excursions 5X each  Cervical retractions 10X each  11/30/21: Supine: Decompression exercises: Head press x 10 Shoulder press x 10 Glute set x 10 Leg press x 10 Leg lengthener x 10 Bridge x 10 LTR x 10 Sitting: Cervical flexion and extension, rotation and side bending x 10 each Cervical retractions x 10  11/21/21: Review of HEP and goals   PATIENT EDUCATION:  Education details: 6/13:  maintaining good posture eval: updated HEP Person educated: Patient Education method: Explanation, Demonstration, Tactile cues, Verbal cues, and Handouts Education comprehension: verbalized understanding, returned demonstration, verbal cues required, tactile cues required, and needs further education   HOME EXERCISE PROGRAM: 12/04/21:  decompression tband exercises with RTB 11/30/2021:  decompression exercises Evaluation: Access Code: [email or web address] URL: [email or web address] Date: 11/21/2021 Prepared by: [facility]  Exercises - Supine Bridge  - 2 x daily - 7 x weekly - 1 sets - 10 reps - Prone Hip Extension  - 2 x daily - 7 x weekly - 1 sets - 10 reps - Supine Scapular Retraction  - 2 x daily - 7 x  weekly - 1 sets - 10 reps  ASSESSMENT:  CLINICAL IMPRESSION: Today's session continued to focus on postural strengthening; posture; core stabilization. Added thera band to scapular retractions today; patient needs verbal cues for equal bridging as she tends to lag on the left side; she also sits with rounded shoulders and forward head when at rest and needs reminders for good posturing. Patient will benefit from continued skilled therapy interventions to address deficits and improve functional mobility.     OBJECTIVE IMPAIRMENTS Abnormal gait, decreased activity tolerance, decreased balance, decreased endurance, decreased mobility, difficulty walking, decreased ROM, decreased strength, decreased safety awareness, hypomobility, increased fascial restrictions, impaired perceived functional ability, impaired flexibility, postural dysfunction, and pain.   ACTIVITY LIMITATIONS carrying, lifting, bending, standing, squatting, sleeping, stairs, transfers, reach over head, and locomotion level  PARTICIPATION LIMITATIONS: meal prep, cleaning, laundry, medication management, shopping, community activity, and yard work  PERSONAL FACTORS Age, Fitness, and 1-2 comorbidities: osteoporosis and HBP  are also affecting patient's functional outcome.   REHAB POTENTIAL: Good  CLINICAL DECISION MAKING: Stable/uncomplicated  EVALUATION COMPLEXITY: Low   GOALS: Goals reviewed with patient? No  SHORT TERM GOALS: Target date: 12/14/2021   patient will be independent with initial HEP Baseline: Goal status: ongoing  2.  Patient will increase right hip extension to 3+ to improve patient ability to transfer sit to stand efficiently. Baseline: 3- Goal status: ongoing   LONG TERM GOALS: Target date: 12/28/2021  Patient will report at least 50% improvement in overall symptoms and/or function to demonstrate improved functional mobility  Baseline:  Goal status: ongoing  2.   Patient will improve FOTO score  to predicted value to demonstrate improved functional mobility.  Baseline: 32 Goal status: ongoing   3.  Patient will improve 5 x STS score from 26 sec to 22 sec to demonstrate improved functional mobility and increased lower extremity strength.  Baseline: 26 sec Goal status: ongoing  4.  Patient will be able to stand x 15 min to cook a light meal without back pain greater than 2/10 to increase independence in the home.  Baseline: 5 min Goal status: ongoing  5.  Patient will be independent with advanced HEP and self management strategies to improve quality of life and functional outcomes.  Baseline:  Goal status: ongoing   PLAN: PT FREQUENCY: 2x/week  PT DURATION: 4 weeks  PLANNED INTERVENTIONS: Therapeutic exercises, Therapeutic activity, Neuromuscular re-education, Balance training, Gait training, Patient/Family education, Joint manipulation, Joint mobilization, Stair training, Vestibular training, Canalith repositioning, Visual/preceptual remediation/compensation, Orthotic/Fit training, DME instructions, Aquatic Therapy, Dry Needling, Electrical stimulation, Spinal manipulation, Spinal mobilization, Cryotherapy, Moist heat, Taping, Traction, Ultrasound, Biofeedback, Ionotophoresis '4mg'$ /ml Dexamethasone, Manual therapy, and Re-evaluation.  PLAN FOR NEXT SESSION: progress core stabilization and postural strengthening as able. returns to MD 01/01/22   11:13 AM, 12/11/21 Janese Radabaugh Small Blu Mcglaun MPT [place] [facility] [place] [phone]

## 2021-12-11 NOTE — Progress Notes (Unsigned)
Referring Provider: Celene Squibb, MD Primary Care Physician:  Celene Squibb, MD Primary GI Physician: Dr. Gala Romney  No chief complaint on file.   HPI:   [name] is a 76 y.o. female presenting today for follow-up of GERD, epigastric pain, diarrhea.  She has a history of chronic constipation (likely opioid induced), adenomatous colon polyps with last colonoscopy in January 2020, no recommendations to repeat due to age, GERD, esophageal dysphagia with Schatzki's ring dilated in April 2021, but also with esophageal and oropharyngeal dysphagia demonstrated on modified barium swallows, history of esophageal granular cell tumor s/p EMR in 2011 by Dr. Newman Pies with [facility]. She has also seen Dr. Newman Pies for uncontrolled GERD in 2014 s/p EGDs, normal GES, and a variety of PPIs. Manometry had been attempted but she was unable to tolerate the probe.   Reported unintentional weight loss and 3 to 4 months of early satiety back in February 2023.  EGD 08/23/2021 completed during hospitalization for hypokalemia and AKI with white speckled mucosa in the esophagus s/p cells obtained for cytology, medium-sized hiatal hernia, grade a reflux esophagitis, mild Schatzki's ring s/p dilation, normal examined stomach and duodenum.  KOH prep was negative.   She also had bedside swallow evaluation completed during admission revealing oral dysphagia with prolonged  prolonged AP, uncoordinated and poor bolus cohesion, moderate oral residue after initial swallow.  With pures, very slow A-P transit and suspected delayed swallow trigger.  No overt aspiration.  Recommended D2/fine chop diet.   At the time of her last visit on 09/05/2021, she reported uncontrolled GERD, off PPI due to confusion about medications at the time of recent hospital discharge.  Associated epigastric pain.  No nausea or vomiting.  No constipation since recent hospitalization, but was actually dealing with diarrhea.  She had not been taking Movantik since  hospitalization.  She was having 5-6 loose stools a day, dark in color, but also taking Kaopectate almost every day which had decreased her stool frequency to 2 loose stools a day.  Dysphagia symptoms stable and most consistent with oropharyngeal etiology, tolerating soft diet.  Plan included checking stool studies, updating labs, TSH, start pantoprazole 40 mg twice daily, refer back to speech therapy.  Stool studies were negative.  CBC and CMP without significant abnormalities.  TSH was low at 0.11.  Lipase normal.  Recommended follow-up with PCP on low TSH.  Modified barium swallow 09/27/2021 with prolonged oral transit with solids with impaired lingual movement resulting in piecemeal deglutition, timely swallow trigger, trace vallecular and lateral channel residuals after liquids which cleared with repeat swallow.  She did have transit delay of barium tablet and distal esophagus which eventually cleared with liquid wash.  Stated patient can have any textures that she would like, it may just require more time/effort.  Unsure whether history of tardive dyskinesia may impact lingual movement.  No dysphagia therapy recommended.   Today:   GERD:   Epigastric pain:   Diarrhea:   Dysphagia:   Weights  Past Medical History:  Diagnosis Date   Adenomatous colon polyp 2006   excised in 2006 & 2010Due surveillance 06/2013   Anxiety    Anxiety and depression    Asthma    Breast mass    right nipple bengn mass per patient   Breast tumor    Chest discomfort    Chronic back pain    Chronic constipation    Complication of anesthesia    Depression    Diverticulosis  Fibromyalgia    Gastroesophageal reflux disease    Hiatal hernia    History of benign esophageal tumor 2010   granular cell esophageal tumor (Dx 06/2008), resected via EMR 2011, due repeat EGD 02/2012   Hyperlipidemia    Hypertension    Insomnia    Migraines    PONV (postoperative nausea and vomiting)    Psychosis ([place])     Sleep apnea    Stop Bang score of 5. Pt said she was told by Dr. Merlene Laughter that she had "a little bit" of sleep apena, but not bad enough to treat.   Thyroid nodule    Tumor of esophagus     Past Surgical History:  Procedure Laterality Date   ABDOMINAL HYSTERECTOMY     BALLOON DILATION  08/23/2021   Procedure: BALLOON DILATION;  Surgeon: Eloise Harman, DO;  [facility];  Service: Endoscopy;;   BIOPSY  10/14/2019   Procedure: BIOPSY;  Surgeon: Daneil Dolin, MD;  [facility];  Service: Endoscopy;;   BRAVO [place] STUDY  12/11/2011   Procedure: BRAVO [place];  Surgeon: Daneil Dolin, MD;  [facility];  Service: Endoscopy;  Laterality: N/A;   BREAST EXCISIONAL BIOPSY  1990s, 2012   Left x2-sclerosing ductal papilloma-2012   CHOLECYSTECTOMY N/A 04/09/2013   Procedure: LAPAROSCOPIC CHOLECYSTECTOMY;  Surgeon: Jamesetta So, MD;  Location: [facility];  Service: General;  Laterality: N/A;   COLONOSCOPY  06/2008, 06/2011   sigmoid tics, tubular adenoma; 2013: anal canal hemorrhoids   COLONOSCOPY  07/10/2011   Anal canal hemorrhoids likely the cause of hematochezia in the setting of constipation; otherwise normal rectum ;submucosal  petechiae in left colon of doubtful clinical significance; otherwise, normal colon   COLONOSCOPY WITH PROPOFOL N/A 11/10/2017   cancelled in pre-op   COLONOSCOPY WITH PROPOFOL N/A 07/23/2018   Procedure: COLONOSCOPY WITH PROPOFOL;  Surgeon: Daneil Dolin, MD; 1 tubular adenoma, diverticulosis in the sigmoid and descending colon, nonbleeding internal hemorrhoids.  No recommendations to repeat due to age.   ESOPHAGEAL BRUSHING  08/23/2021   Procedure: ESOPHAGEAL BRUSHING;  Surgeon: Eloise Harman, DO;  [facility];  Service: Endoscopy;;   ESOPHAGEAL DILATION N/A 05/08/2015   Procedure: ESOPHAGEAL DILATION;  Surgeon: Daneil Dolin, MD;  Location: [facility];  Service: Endoscopy;  Laterality: N/AVenia Minks 54/56    ESOPHAGOGASTRODUODENOSCOPY  12/11/2011   FXT:[id number] Schatzki's ring; otherwise normal/Small hiatal hernia. Antral and bulbar erosions   ESOPHAGOGASTRODUODENOSCOPY (EGD) WITH PROPOFOL N/A 05/08/2015   Dr. Gala Romney: mild erosive reflux esophagitis, non-critical Schatzki's ring s/p dilation. Hiatal hernia.    ESOPHAGOGASTRODUODENOSCOPY (EGD) WITH PROPOFOL N/A 10/14/2019   Procedure: ESOPHAGOGASTRODUODENOSCOPY (EGD) WITH PROPOFOL;  Surgeon: Daneil Dolin, MD;  Nonobstructing Schatzki ring, small hiatal hernia, abnormal gastric mucosa s/p biopsied, normal first and second portion of the duodenum.  Pathology with chronic inactive gastritis, no H. pylori.   ESOPHAGOGASTRODUODENOSCOPY (EGD) WITH PROPOFOL N/A 08/23/2021   Procedure: ESOPHAGOGASTRODUODENOSCOPY (EGD) with or without dilation WITH PROPOFOL;  Surgeon: Eloise Harman, DO;  [facility];  Service: Endoscopy;  Laterality: N/A;   EUS  08/2010   Dr [facility] with EGD. Retained food. No recurrent esophageal lesion, bx negative.   IR KYPHO THORACIC WITH BONE BIOPSY  10/17/2020   IR RADIOLOGIST EVAL & MGMT  09/12/2020   LUNG BIOPSY     negative   PARATHYROIDECTOMY  April 2017   [facility].    PARATHYROIDECTOMY     POLYPECTOMY  07/23/2018   Procedure: POLYPECTOMY;  Surgeon: Daneil Dolin, MD;  [facility];  Service: Endoscopy;;  colon   RIGHT OOPHORECTOMY     benign disease   SHOULDER ARTHROSCOPY     Right; bone spurs removed   TONSILLECTOMY     TOTAL KNEE ARTHROPLASTY  2002   Right; previous arthroscopic surgery    Current Outpatient Medications  Medication Sig Dispense Refill   acetaminophen (TYLENOL) 500 MG tablet Take 2 tablets (1,000 mg total) by mouth every 8 (eight) hours as needed for mild pain, moderate pain or fever.     AIMOVIG 70 MG/ML SOAJ Inject 70 mg into the skin every 30 (thirty) days.     albuterol (PROVENTIL HFA;VENTOLIN HFA) 108 (90 BASE) MCG/ACT inhaler Inhale 2 puffs into the lungs every 6 (six)  hours as needed for wheezing or shortness of breath.      amLODipine (NORVASC) 10 MG tablet Take 10 mg by mouth daily.     atenolol (TENORMIN) 100 MG tablet TAKE 1 TABLET BY MOUTH  DAILY (Patient taking differently: 25 mg.) 90 tablet 3   cetirizine (ZYRTEC) 10 MG tablet Take 10 mg by mouth daily as needed for allergies.     cycloSPORINE (RESTASIS) 0.05 % ophthalmic emulsion Place 1 drop into both eyes 2 (two) times daily as needed (dry eyes).     Ensure Max Protein (ENSURE MAX PROTEIN) LIQD Take 330 mLs (11 oz total) by mouth 2 (two) times daily.     famotidine (PEPCID) 20 MG tablet Take 2 tablets (40 mg total) by mouth at bedtime. 60 tablet 3   fluticasone (FLONASE) 50 MCG/ACT nasal spray Place 1 spray into both nostrils daily as needed for allergies.  (Patient not taking: Reported on 09/05/2021)     furosemide (LASIX) 40 MG tablet Take 1 tablet (40 mg total) by mouth daily. 30 tablet 2   levothyroxine (SYNTHROID) 50 MCG tablet Take 50 mcg by mouth daily.     losartan (COZAAR) 25 MG tablet Take 1 tablet (25 mg total) by mouth daily. 90 tablet 3   lovastatin (MEVACOR) 40 MG tablet Take 40 mg by mouth at bedtime.     memantine (NAMENDA) 10 MG tablet Take 10 mg by mouth 2 (two) times daily.  (Patient not taking: Reported on 09/05/2021)     OLANZapine (ZYPREXA) 5 MG tablet Take 5 mg by mouth at bedtime.     pantoprazole (PROTONIX) 40 MG tablet Take 1 tablet (40 mg total) by mouth 2 (two) times daily. 60 tablet 2   potassium chloride SA (KLOR-CON) 20 MEQ tablet Take 2 tablets (40 meq) by mouth in the morning and take 1 tablet (20 meq) by mouth in the evening 270 tablet 3   triamcinolone cream (KENALOG) 0.1 % Apply 1 application topically 2 (two) times daily. (Patient not taking: Reported on 09/05/2021)     venlafaxine XR (EFFEXOR-XR) 150 MG 24 hr capsule Take 150 mg by mouth at bedtime. Take with 75 mg to equal 225 mg at bedtime (Patient not taking: Reported on 09/05/2021)     venlafaxine XR (EFFEXOR-XR)  75 MG 24 hr capsule Take 75 mg by mouth at bedtime. Take with 150 mg to equal 225 mg at bedtime     zolpidem (AMBIEN) 10 MG tablet Take 10 mg by mouth at bedtime.     No current facility-administered medications for this visit.    Allergies as of 12/12/2021 - Review Complete 10/22/2021  Allergen Reaction Noted   Elavil [  amitriptyline] Other (See Comments) 06/03/2012   Abilify [aripiprazole] Other (See Comments) 11/25/2012   Codeine Hives, Nausea Only, and Other (See Comments)    Latex Hives 11/21/2011   Penicillins Hives, Itching, and Other (See Comments)    Polyethylene glycol Other (See Comments) 12/26/2012   Sulfonamide derivatives Nausea And Vomiting    Cymbalta [duloxetine hcl] Rash 03/10/2013   Remeron [mirtazapine] Other (See Comments) 05/06/2012    Family History  Problem Relation Age of Onset   Stroke Father    Alcohol abuse Father    Heart attack Mother    Depression Mother    Anxiety disorder Mother    Colon cancer Paternal Aunt    Colon cancer Paternal Uncle    Dementia Maternal Uncle    ADD / ADHD Neg Hx    Bipolar disorder Neg Hx    Drug abuse Neg Hx    OCD Neg Hx    Paranoid behavior Neg Hx    Schizophrenia Neg Hx    Seizures Neg Hx    Sexual abuse Neg Hx    Physical abuse Neg Hx     Social History   Socioeconomic History   Marital status: Single    Spouse name: Not on file   Number of children: 1   Years of education: Not on file   Highest education level: Not on file  Occupational History   Occupation: disabled    Employer: RETIRED  Tobacco Use   Smoking status: Never   Smokeless tobacco: Never  Vaping Use   Vaping Use: Never used  Substance and Sexual Activity   Alcohol use: No    Alcohol/week: 0.0 standard drinks of alcohol   Drug use: No   Sexual activity: Never  Other Topics Concern   Not on file  Social History Narrative   Not on file   Social Determinants of Health   Financial Resource Strain: Not on file  Food Insecurity:  Not on file  Transportation Needs: Not on file  Physical Activity: Not on file  Stress: Not on file  Social Connections: Not on file    Review of Systems: Gen: Denies fever, chills, cold or flu like symptoms, pre-syncope, or syncope.   CV: Denies chest pain, palpitations. Resp: Denies dyspnea at rest, cough. GI: See HPI Heme: See HPI  Physical Exam: There were no vitals taken for this visit. General:   Alert and oriented. No distress noted. Pleasant and cooperative.  Head:  Normocephalic and atraumatic. Eyes:  Conjuctiva clear without scleral icterus. Heart:  S1, S2 present without murmurs appreciated. Lungs:  Clear to auscultation bilaterally. No wheezes, rales, or rhonchi. No distress.  Abdomen:  +BS, soft, non-tender and non-distended. No rebound or guarding. No HSM or masses noted. Msk:  Symmetrical without gross deformities. Normal posture. Extremities:  Without edema. Neurologic:  Alert and  oriented x4 Psych:  Normal mood and affect.    Assessment:     Plan:  ***   Aliene Altes, PA-C [facility] 12/12/2021

## 2021-12-12 ENCOUNTER — Ambulatory Visit: Payer: Medicare Other | Admitting: Gastroenterology

## 2021-12-12 ENCOUNTER — Encounter: Payer: Self-pay | Admitting: Gastroenterology

## 2021-12-12 VITALS — BP 156/82 | HR 84 | Temp 97.5°F | Ht 62.0 in | Wt 189.2 lb

## 2021-12-12 DIAGNOSIS — R197 Diarrhea, unspecified: Secondary | ICD-10-CM

## 2021-12-12 DIAGNOSIS — R1013 Epigastric pain: Secondary | ICD-10-CM

## 2021-12-12 DIAGNOSIS — K219 Gastro-esophageal reflux disease without esophagitis: Secondary | ICD-10-CM

## 2021-12-12 NOTE — Patient Instructions (Addendum)
We will continue on Protonix 40 mg twice daily 30 minutes before breakfast and dinner as requested.  You do not necessarily need to take Pepcid every night at bedtime, but instead can use it as needed for breakthrough heartburn symptoms.  To help prevent breakthrough acid reflux symptoms, I would recommend that you limit salad as this is the primary contributor of your breakthrough symptoms.  Additional recommendations to help prevent reflux/indigestion: Avoid fried, fatty, greasy, spicy, citrus foods. Avoid caffeine and carbonated beverages. Avoid chocolate. Try eating 4-6 small meals a day rather than 3 large meals. Do not eat within 3 hours of laying down. Prop head of bed up on wood or bricks to create a 6 inch incline.  We will plan to see back in 4 months to see how you are doing.  If you change your mind and would like to change your acid reflux medication prior to your follow-up visit, please let me know.  It was great to see you again today!  Aliene Altes, PA-C [facility]

## 2021-12-13 ENCOUNTER — Ambulatory Visit (HOSPITAL_COMMUNITY): Payer: Medicare Other | Admitting: Physical Therapy

## 2021-12-13 DIAGNOSIS — R262 Difficulty in walking, not elsewhere classified: Secondary | ICD-10-CM | POA: Diagnosis not present

## 2021-12-13 DIAGNOSIS — M545 Low back pain, unspecified: Secondary | ICD-10-CM

## 2021-12-13 DIAGNOSIS — M6281 Muscle weakness (generalized): Secondary | ICD-10-CM | POA: Diagnosis not present

## 2021-12-13 DIAGNOSIS — M546 Pain in thoracic spine: Secondary | ICD-10-CM

## 2021-12-13 NOTE — Therapy (Signed)
OUTPATIENT PHYSICAL THERAPY TREATMENT   Patient Name: [name] MRN: [id number] DOB:January 04, 1946, 76 y.o., female Today's Date: 12/13/2021   PT End of Session - 12/13/21 1042     Visit Number 6    Number of Visits 8    Date for PT Re-Evaluation 12/19/21    Authorization Type UHC Medicare; no auth required, no co-pay    Authorization Time Period 06/24/21-06-23-2022    Progress Note Due on Visit 10    PT Start Time 1005    PT Stop Time 1050    PT Time Calculation (min) 45 min                Past Medical History:  Diagnosis Date   Adenomatous colon polyp 2006   excised in 2006 & 2010Due surveillance 06/2013   Anxiety    Anxiety and depression    Asthma    Breast mass    right nipple bengn mass per patient   Breast tumor    Chest discomfort    Chronic back pain    Chronic constipation    Complication of anesthesia    Depression    Diverticulosis    Fibromyalgia    Gastroesophageal reflux disease    Hiatal hernia    History of benign esophageal tumor 2010   granular cell esophageal tumor (Dx 06/2008), resected via EMR 2011, due repeat EGD 02/2012   Hyperlipidemia    Hypertension    Insomnia    Migraines    PONV (postoperative nausea and vomiting)    Psychosis ([place])    Sleep apnea    Stop Bang score of 5. Pt said she was told by Dr. Merlene Laughter that she had "a little bit" of sleep apena, but not bad enough to treat.   Thyroid nodule    Tumor of esophagus    Past Surgical History:  Procedure Laterality Date   ABDOMINAL HYSTERECTOMY     BALLOON DILATION  08/23/2021   Procedure: BALLOON DILATION;  Surgeon: Eloise Harman, DO;  [facility];  Service: Endoscopy;;   BIOPSY  10/14/2019   Procedure: BIOPSY;  Surgeon: Daneil Dolin, MD;  [facility];  Service: Endoscopy;;   BRAVO [place] STUDY  12/11/2011   Procedure: BRAVO [place];  Surgeon: Daneil Dolin, MD;  [facility];  Service: Endoscopy;  Laterality: N/A;   BREAST  EXCISIONAL BIOPSY  1990s, 2012   Left x2-sclerosing ductal papilloma-2012   CHOLECYSTECTOMY N/A 04/09/2013   Procedure: LAPAROSCOPIC CHOLECYSTECTOMY;  Surgeon: Jamesetta So, MD;  Location: [facility];  Service: General;  Laterality: N/A;   COLONOSCOPY  06/2008, 06/2011   sigmoid tics, tubular adenoma; 2013: anal canal hemorrhoids   COLONOSCOPY  07/10/2011   Anal canal hemorrhoids likely the cause of hematochezia in the setting of constipation; otherwise normal rectum ;submucosal  petechiae in left colon of doubtful clinical significance; otherwise, normal colon   COLONOSCOPY WITH PROPOFOL N/A 11/10/2017   cancelled in pre-op   COLONOSCOPY WITH PROPOFOL N/A 07/23/2018   Procedure: COLONOSCOPY WITH PROPOFOL;  Surgeon: Daneil Dolin, MD; 1 tubular adenoma, diverticulosis in the sigmoid and descending colon, nonbleeding internal hemorrhoids.  No recommendations to repeat due to age.   ESOPHAGEAL BRUSHING  08/23/2021   Procedure: ESOPHAGEAL BRUSHING;  Surgeon: Eloise Harman, DO;  [facility];  Service: Endoscopy;;   ESOPHAGEAL DILATION N/A 05/08/2015   Procedure: ESOPHAGEAL DILATION;  Surgeon: Daneil Dolin, MD;  Location: [facility];  Service:  Endoscopy;  Laterality: N/AVenia Minks 54/56   ESOPHAGOGASTRODUODENOSCOPY  12/11/2011   OIN:OMVEHMCNOBS Schatzki's ring; otherwise normal/Small hiatal hernia. Antral and bulbar erosions   ESOPHAGOGASTRODUODENOSCOPY (EGD) WITH PROPOFOL N/A 05/08/2015   Dr. Gala Romney: mild erosive reflux esophagitis, non-critical Schatzki's ring s/p dilation. Hiatal hernia.    ESOPHAGOGASTRODUODENOSCOPY (EGD) WITH PROPOFOL N/A 10/14/2019   Procedure: ESOPHAGOGASTRODUODENOSCOPY (EGD) WITH PROPOFOL;  Surgeon: Daneil Dolin, MD;  Nonobstructing Schatzki ring, small hiatal hernia, abnormal gastric mucosa s/p biopsied, normal first and second portion of the duodenum.  Pathology with chronic inactive gastritis, no H. pylori.   ESOPHAGOGASTRODUODENOSCOPY (EGD) WITH PROPOFOL  N/A 08/23/2021   Surgeon: Eloise Harman, DO;   white speckled mucosa in the esophagus s/p cells obtained for cytology (KOH prep negative), medium-sized hiatal hernia, grade A reflux esophagitis, mild Schatzki's ring s/p dilation, normal examined stomach and duodenum.   EUS  08/2010   Dr [facility] with EGD. Retained food. No recurrent esophageal lesion, bx negative.   IR KYPHO THORACIC WITH BONE BIOPSY  10/17/2020   IR RADIOLOGIST EVAL & MGMT  09/12/2020   LUNG BIOPSY     negative   PARATHYROIDECTOMY  09/2015   [facility].    PARATHYROIDECTOMY     POLYPECTOMY  07/23/2018   Procedure: POLYPECTOMY;  Surgeon: Daneil Dolin, MD;  [facility];  Service: Endoscopy;;  colon   RIGHT OOPHORECTOMY     benign disease   SHOULDER ARTHROSCOPY     Right; bone spurs removed   TONSILLECTOMY     TOTAL KNEE ARTHROPLASTY  2002   Right; previous arthroscopic surgery   Patient Active Problem List   Diagnosis Date Noted   Diarrhea 12/12/2021   AKI (acute kidney injury) ([place]) 08/21/2021   Abnormal EKG 08/21/2021   Type 2 diabetes mellitus ([place]) 08/21/2021   Hypokalemia 08/20/2021   Early satiety 08/14/2021   Loss of weight 08/14/2021   Chronic diastolic heart failure ([place]) 04/01/2021   Chronic pain syndrome 04/01/2021   Chronic kidney disease, stage 3a ([place]) 12/21/2020   Neck pain 03/20/2020   Bloating 11/18/2019   Chronic migraine without aura, with status migrainosus 11/04/2019   Mild cognitive disorder 11/04/2019   Oropharyngeal dysphagia    H/O adenomatous polyp of colon 09/29/2017   Lower abdominal pain 09/29/2017   Obstructive sleep apnea 09/06/2017   Obesity (BMI 30-39.9) 03/07/2017   Primary osteoarthritis of both hands 03/07/2017   Lower extremity edema 03/03/2017   Restless legs syndrome 01/20/2017   Thyroid nodule 10/17/2016   Pruritus 10/06/2016   Major depressive disorder with psychotic features ([place]) 09/11/2016   Neurocognitive deficits 09/11/2016   (HFpEF) heart  failure with preserved ejection fraction ([place]) 07/22/2016   Benzodiazepine withdrawal, with perceptual disturbance ([place]) 07/22/2016   Urinary frequency 11/07/2015   Abdominal pain 07/26/2015   Hyperparathyroidism ([place]) 06/15/2015   History of depression 06/14/2015   Hypercalcemia 06/13/2015   Major neurocognitive disorder ([place]) 06/13/2015   Confusion 05/29/2015   Odynophagia    Schatzki's ring    Hiatal hernia    Knee contusion 05/02/2014   Acquired trigger finger 10/12/2013   Chronic constipation 07/15/2013   Abdominal pain, epigastric 07/15/2013   LLQ pain 07/15/2013   Lightheaded 01/21/2013   Elevated LFTs 12/26/2012   Insomnia 05/06/2012   History of benign esophageal tumor    Adenomatous colon polyp    Epigastric pain 12/10/2010   DEGENERATIVE DISC DISEASE, LUMBOSACRAL SPINE 05/01/2010   Mixed hyperlipidemia 08/22/2009   THYROID NODULE 08/16/2009   Anxiety and  depression 08/16/2009   Essential hypertension 08/16/2009   Gastroesophageal reflux disease 08/16/2009   FIBROMYALGIA 08/16/2009    PCP:  Edwinna Areola. [place] Crane, MD  REFERRING PROVIDER: Phillips Odor, MD  REFERRING DIAG: LBP  Rationale for Evaluation and Treatment Rehabilitation  THERAPY DIAG:  Low back pain, unspecified back pain laterality, unspecified chronicity, unspecified whether sciatica present  Pain in thoracic spine  Difficulty in walking, not elsewhere classified  ONSET DATE: 7 months ago  SUBJECTIVE:                                                                                                                                                                                           SUBJECTIVE STATEMENT: Patient reports her knees are still bothering her at 8/10 and feels like something we're doing here at therapy is causing it.  Reports she only feels she is 5% improved since starting but cannot recall any functional improvements at home.  States her back feels better "until I start to do something".   Still not able to cook a small meal and depends upon her daughter 100% for this.  PERTINENT HISTORY:  Right TKA in 2002 Bilateral knee pain; injections every 3 months with Dr. Aline Brochure osteoporosis  PAIN:  Are you having pain? Yes: NPRS scale: 8/10 Pain location: Lt knee Pain description: aching Aggravating factors: activity Relieving factors: rest, medication   PRECAUTIONS: Fall  WEIGHT BEARING RESTRICTIONS No  FALLS:  Has patient fallen in last 6 months? Yes. Number of falls 3  LIVING ENVIRONMENT: Lives with: lives with their daughter Lives in: House/apartment Stairs: No Has following equipment at home: Programmer, multimedia, Environmental consultant - 2 wheeled, Health visitor, Wheelchair (manual), shower chair, bed side commode, and Grab bars  OCCUPATION: retired Garment/textile technologist  PLOF: Independent; now using a [place]; now daughter assists her with household tasks but she can dress and bathe mod independently  PATIENT GOALS be able to cook without my back hurting   OBJECTIVE:   DIAGNOSTIC FINDINGS:  Scheduled for an x-ray 12/12/20  PATIENT SURVEYS:  FOTO 32  SCREENING FOR RED FLAGS: Bowel or bladder incontinence: Yes: urine only Spinal tumors: No Cauda equina syndrome: No Compression fracture: Yes: hx of previous compression fx Abdominal aneurysm: No  COGNITION:  Overall cognitive status: History of cognitive impairments - at baseline     SENSATION: Reports occasional  numbness in her feet   POSTURE: rounded shoulders, forward head, and increased thoracic kyphosis  PALPATION: Mild tenderness lower thoracic spine spinous processes and paraspinals  LUMBAR ROM:   Active  A/PROM  eval  Flexion wfl  Extension 50% available  Right lateral flexion wfl  Left lateral flexion wfl  Right rotation wfl  Left rotation wfl   (Blank rows = not tested)   LOWER EXTREMITY MMT:    MMT Right eval Left eval  Hip flexion 4+ 4+  Hip extension 3- 4  Hip  abduction 3+ 3+  Hip adduction    Hip internal rotation    Hip external rotation    Knee flexion    Knee extension 4 5  Ankle dorsiflexion 5 5  Ankle plantarflexion    Ankle inversion    Ankle eversion     (Blank rows = not tested)   FUNCTIONAL TESTS:  5 times sit to stand: 26 sec  GAIT: Distance walked: 60 ft Assistive device utilized: Environmental consultant - 2 wheeled Level of assistance: SBA Comments: slow gait speed; slight forward flexed trunk    TODAY'S TREATMENT  12/13/21 Sitting:  cervical retractions 10X  Scapular retractions 10X  Scapular retractions RTB 2 x 10 Supine: decompression exercises 2-5 5" hold x 10  Decompression RTB 1-4 10X each  Bridge 10X  SLR 10X each   12/11/21 Supine: Decompression exercises 10 x 5" hold Head press Shoulder press Leg lengthener Leg press  LTR x 10 Glute squeeze 5" hold x 10 Bridge x 10 SLR x 10   Sitting: Cervical retractions x 10 Scapular retractionx x 10    Scapular retraction with RTB 2 x 10    12/06/21: Supine:  Decompression 2-5 10X 3" holds each  Decompression with RTB 1-4 10X each  Bridge 10X 2 sets  LTR 10X5"  SLR 10X each 2 sets Seated:  Wbacks 10X3"   Cervical excursions 10X each  Cervical retractions 10X each  12/04/21: Supine:  Decompression 2-5 10X 3" holds each  Decompression with RTB 1-4 10X each  Bridge 10X   LTR 10X5"  SLR 10X each Seated:  Wbacks 10X3"   Cervical excursions 5X each  Cervical retractions 10X each  11/30/21: Supine: Decompression exercises: Head press x 10 Shoulder press x 10 Glute set x 10 Leg press x 10 Leg lengthener x 10 Bridge x 10 LTR x 10 Sitting: Cervical flexion and extension, rotation and side bending x 10 each Cervical retractions x 10  11/21/21: Review of HEP and goals   PATIENT EDUCATION:  Education details: 6/13:  maintaining good posture eval: updated HEP Person educated: Patient Education method: Explanation, Demonstration, Tactile cues, Verbal  cues, and Handouts Education comprehension: verbalized understanding, returned demonstration, verbal cues required, tactile cues required, and needs further education   HOME EXERCISE PROGRAM: 12/04/21:  decompression tband exercises with RTB 11/30/2021:  decompression exercises Evaluation: Access Code: [email or web address] URL: [email or web address] Date: 11/21/2021 Prepared by: [facility]  Exercises - Supine Bridge  - 2 x daily - 7 x weekly - 1 sets - 10 reps - Prone Hip Extension  - 2 x daily - 7 x weekly - 1 sets - 10 reps - Supine Scapular Retraction  - 2 x daily - 7 x weekly - 1 sets - 10 reps  ASSESSMENT:  CLINICAL IMPRESSION: Continued to focus on postural strengthening, spinal decompression and LE strengthening.  Discussed therex being completed at present and ensured none of the therex being completed could possibly be flaring up her knees as they are NWB and not directly associated with knee function.  Pt does require less cues with therex form, however still tends to sit slumped with forward head.  Even with cues, she only maintains briefly before returning to bad posturing.  Pt encouraged to attempt a small meal over the weekend to see how she progresses with this with her daughters help (daughter also informed). Instructed to keep time to complete, how many rests, pain level, etc.  Patient will benefit from continued skilled therapy interventions to address deficits and improve functional mobility.     OBJECTIVE IMPAIRMENTS Abnormal gait, decreased activity tolerance, decreased balance, decreased endurance, decreased mobility, difficulty walking, decreased ROM, decreased strength, decreased safety awareness, hypomobility, increased fascial restrictions, impaired perceived functional ability, impaired flexibility, postural dysfunction, and pain.   ACTIVITY LIMITATIONS carrying, lifting, bending, standing, squatting, sleeping, stairs, transfers, reach over head, and locomotion  level  PARTICIPATION LIMITATIONS: meal prep, cleaning, laundry, medication management, shopping, community activity, and yard work  PERSONAL FACTORS Age, Fitness, and 1-2 comorbidities: osteoporosis and HBP  are also affecting patient's functional outcome.   REHAB POTENTIAL: Good  CLINICAL DECISION MAKING: Stable/uncomplicated  EVALUATION COMPLEXITY: Low   GOALS: Goals reviewed with patient? No  SHORT TERM GOALS: Target date: 12/14/2021   patient will be independent with initial HEP Baseline: Goal status: ongoing  2.  Patient will increase right hip extension to 3+ to improve patient ability to transfer sit to stand efficiently. Baseline: 3- Goal status: ongoing   LONG TERM GOALS: Target date: 12/28/2021  Patient will report at least 50% improvement in overall symptoms and/or function to demonstrate improved functional mobility  Baseline:  Goal status: ongoing  2.   Patient will improve FOTO score to predicted value to demonstrate improved functional mobility.  Baseline: 32 Goal status: ongoing   3.  Patient will improve 5 x STS score from 26 sec to 22 sec to demonstrate improved functional mobility and increased lower extremity strength.  Baseline: 26 sec Goal status: ongoing  4.  Patient will be able to stand x 15 min to cook a light meal without back pain greater than 2/10 to increase independence in the home.  Baseline: 5 min Goal status: ongoing  5.  Patient will be independent with advanced HEP and self management strategies to improve quality of life and functional outcomes.  Baseline:  Goal status: ongoing   PLAN: PT FREQUENCY: 2x/week  PT DURATION: 4 weeks  PLANNED INTERVENTIONS: Therapeutic exercises, Therapeutic activity, Neuromuscular re-education, Balance training, Gait training, Patient/Family education, Joint manipulation, Joint mobilization, Stair training, Vestibular training, Canalith repositioning, Visual/preceptual remediation/compensation,  Orthotic/Fit training, DME instructions, Aquatic Therapy, Dry Needling, Electrical stimulation, Spinal manipulation, Spinal mobilization, Cryotherapy, Moist heat, Taping, Traction, Ultrasound, Biofeedback, Ionotophoresis '4mg'$ /ml Dexamethasone, Manual therapy, and Re-evaluation.  PLAN FOR NEXT SESSION: progress core stabilization and postural strengthening as able. returns to MD 01/01/22.  Complete progress note next session.   Teena Irani, PTA/CLT [place] Ph: [phone]  10:42 AM, 12/13/21

## 2021-12-18 ENCOUNTER — Ambulatory Visit (HOSPITAL_COMMUNITY): Payer: Medicare Other | Admitting: Physical Therapy

## 2021-12-18 DIAGNOSIS — M546 Pain in thoracic spine: Secondary | ICD-10-CM

## 2021-12-18 DIAGNOSIS — M6281 Muscle weakness (generalized): Secondary | ICD-10-CM

## 2021-12-18 DIAGNOSIS — R262 Difficulty in walking, not elsewhere classified: Secondary | ICD-10-CM

## 2021-12-18 DIAGNOSIS — M545 Low back pain, unspecified: Secondary | ICD-10-CM | POA: Diagnosis not present

## 2021-12-18 NOTE — Therapy (Addendum)
OUTPATIENT PHYSICAL THERAPY TREATMENT  PHYSICAL THERAPY DISCHARGE SUMMARY  Visits from Start of Care: 7  Current functional level related to goals / functional outcomes: See below   Remaining deficits: See below   Education / Equipment: See below   Patient agrees to discharge. Patient goals were partially met. Patient is being discharged due to being pleased with the current functional level.   Progress Note/Discharge Reporting Period 11/21/21 to 12/18/21  See note below for Objective Data and Assessment of Progress/Goals.     Patient Name: CORNELL GABER MRN: [id number] DOB:[birth year], 76 y.o., female Today's Date: 12/18/2021   PT End of Session - 12/18/21 1010     Visit Number 7    Number of Visits 8    Date for PT Re-Evaluation 12/19/21    Authorization Type UHC Medicare; no auth required, no co-pay    Authorization Time Period 06/24/21-06-23-2022    Progress Note Due on Visit 10    PT Start Time 1003    PT Stop Time 1045    PT Time Calculation (min) 42 min                Past Medical History:  Diagnosis Date   Adenomatous colon polyp 2006   excised in 2006 & 2010Due surveillance 06/2013   Anxiety    Anxiety and depression    Asthma    Breast mass    right nipple bengn mass per patient   Breast tumor    Chest discomfort    Chronic back pain    Chronic constipation    Complication of anesthesia    Depression    Diverticulosis    Fibromyalgia    Gastroesophageal reflux disease    Hiatal hernia    History of benign esophageal tumor 2010   granular cell esophageal tumor (Dx 06/2008), resected via EMR 2011, due repeat EGD 02/2012   Hyperlipidemia    Hypertension    Insomnia    Migraines    PONV (postoperative nausea and vomiting)    Psychosis ([place])    Sleep apnea    Stop Bang score of 5. Pt said she was told by Dr. Merlene Laughter that she had "a little bit" of sleep apena, but not bad enough to treat.   Thyroid nodule    Tumor of esophagus    Past  Surgical History:  Procedure Laterality Date   ABDOMINAL HYSTERECTOMY     BALLOON DILATION  08/23/2021   Procedure: BALLOON DILATION;  Surgeon: Eloise Harman, DO;  [facility];  Service: Endoscopy;;   BIOPSY  10/14/2019   Procedure: BIOPSY;  Surgeon: Daneil Dolin, MD;  [facility];  Service: Endoscopy;;   BRAVO [place] STUDY  12/11/2011   Procedure: BRAVO [place];  Surgeon: Daneil Dolin, MD;  [facility];  Service: Endoscopy;  Laterality: N/A;   BREAST EXCISIONAL BIOPSY  1990s, 2012   Left x2-sclerosing ductal papilloma-2012   CHOLECYSTECTOMY N/A 04/09/2013   Procedure: LAPAROSCOPIC CHOLECYSTECTOMY;  Surgeon: Jamesetta So, MD;  Location: [facility];  Service: General;  Laterality: N/A;   COLONOSCOPY  06/2008, 06/2011   sigmoid tics, tubular adenoma; 2013: anal canal hemorrhoids   COLONOSCOPY  07/10/2011   Anal canal hemorrhoids likely the cause of hematochezia in the setting of constipation; otherwise normal rectum ;submucosal  petechiae in left colon of doubtful clinical significance; otherwise, normal colon   COLONOSCOPY WITH PROPOFOL N/A 11/10/2017   cancelled in pre-op   COLONOSCOPY WITH PROPOFOL  N/A 07/23/2018   Procedure: COLONOSCOPY WITH PROPOFOL;  Surgeon: Daneil Dolin, MD; 1 tubular adenoma, diverticulosis in the sigmoid and descending colon, nonbleeding internal hemorrhoids.  No recommendations to repeat due to age.   ESOPHAGEAL BRUSHING  08/23/2021   Procedure: ESOPHAGEAL BRUSHING;  Surgeon: Eloise Harman, DO;  [facility];  Service: Endoscopy;;   ESOPHAGEAL DILATION N/A 05/08/2015   Procedure: ESOPHAGEAL DILATION;  Surgeon: Daneil Dolin, MD;  Location: [facility];  Service: Endoscopy;  Laterality: N/AVenia Minks 54/56   ESOPHAGOGASTRODUODENOSCOPY  12/11/2011   GYI:RSWNIOEVOJJ Schatzki's ring; otherwise normal/Small hiatal hernia. Antral and bulbar erosions   ESOPHAGOGASTRODUODENOSCOPY (EGD) WITH PROPOFOL N/A 05/08/2015   Dr.  Gala Romney: mild erosive reflux esophagitis, non-critical Schatzki's ring s/p dilation. Hiatal hernia.    ESOPHAGOGASTRODUODENOSCOPY (EGD) WITH PROPOFOL N/A 10/14/2019   Procedure: ESOPHAGOGASTRODUODENOSCOPY (EGD) WITH PROPOFOL;  Surgeon: Daneil Dolin, MD;  Nonobstructing Schatzki ring, small hiatal hernia, abnormal gastric mucosa s/p biopsied, normal first and second portion of the duodenum.  Pathology with chronic inactive gastritis, no H. pylori.   ESOPHAGOGASTRODUODENOSCOPY (EGD) WITH PROPOFOL N/A 08/23/2021   Surgeon: Eloise Harman, DO;   white speckled mucosa in the esophagus s/p cells obtained for cytology (KOH prep negative), medium-sized hiatal hernia, grade A reflux esophagitis, mild Schatzki's ring s/p dilation, normal examined stomach and duodenum.   EUS  08/2010   Dr [facility] with EGD. Retained food. No recurrent esophageal lesion, bx negative.   IR KYPHO THORACIC WITH BONE BIOPSY  10/17/2020   IR RADIOLOGIST EVAL & MGMT  09/12/2020   LUNG BIOPSY     negative   PARATHYROIDECTOMY  09/2015   [facility].    PARATHYROIDECTOMY     POLYPECTOMY  07/23/2018   Procedure: POLYPECTOMY;  Surgeon: Daneil Dolin, MD;  [facility];  Service: Endoscopy;;  colon   RIGHT OOPHORECTOMY     benign disease   SHOULDER ARTHROSCOPY     Right; bone spurs removed   TONSILLECTOMY     TOTAL KNEE ARTHROPLASTY  2002   Right; previous arthroscopic surgery   Patient Active Problem List   Diagnosis Date Noted   Diarrhea 12/12/2021   AKI (acute kidney injury) ([place]) 08/21/2021   Abnormal EKG 08/21/2021   Type 2 diabetes mellitus ([place]) 08/21/2021   Hypokalemia 08/20/2021   Early satiety 08/14/2021   Loss of weight 08/14/2021   Chronic diastolic heart failure ([place]) 04/01/2021   Chronic pain syndrome 04/01/2021   Chronic kidney disease, stage 3a ([place]) 12/21/2020   Neck pain 03/20/2020   Bloating 11/18/2019   Chronic migraine without aura, with status migrainosus 11/04/2019   Mild cognitive  disorder 11/04/2019   Oropharyngeal dysphagia    H/O adenomatous polyp of colon 09/29/2017   Lower abdominal pain 09/29/2017   Obstructive sleep apnea 09/06/2017   Obesity (BMI 30-39.9) 03/07/2017   Primary osteoarthritis of both hands 03/07/2017   Lower extremity edema 03/03/2017   Restless legs syndrome 01/20/2017   Thyroid nodule 10/17/2016   Pruritus 10/06/2016   Major depressive disorder with psychotic features ([place]) 09/11/2016   Neurocognitive deficits 09/11/2016   (HFpEF) heart failure with preserved ejection fraction ([place]) 07/22/2016   Benzodiazepine withdrawal, with perceptual disturbance ([place]) 07/22/2016   Urinary frequency 11/07/2015   Abdominal pain 07/26/2015   Hyperparathyroidism ([place]) 06/15/2015   History of depression 06/14/2015   Hypercalcemia 06/13/2015   Major neurocognitive disorder ([place]) 06/13/2015   Confusion 05/29/2015   Odynophagia    Schatzki's ring    Hiatal  hernia    Knee contusion 05/02/2014   Acquired trigger finger 10/12/2013   Chronic constipation 07/15/2013   Abdominal pain, epigastric 07/15/2013   LLQ pain 07/15/2013   Lightheaded 01/21/2013   Elevated LFTs 12/26/2012   Insomnia 05/06/2012   History of benign esophageal tumor    Adenomatous colon polyp    Epigastric pain 12/10/2010   DEGENERATIVE [place] DISEASE, LUMBOSACRAL SPINE 05/01/2010   Mixed hyperlipidemia 08/22/2009   THYROID NODULE 08/16/2009   Anxiety and depression 08/16/2009   Essential hypertension 08/16/2009   Gastroesophageal reflux disease 08/16/2009   FIBROMYALGIA 08/16/2009    PCP:  Edwinna Areola. [place] Crane, MD  REFERRING PROVIDER: Phillips Odor, MD  REFERRING DIAG: LBP  Rationale for Evaluation and Treatment Rehabilitation  THERAPY DIAG:  Low back pain, unspecified back pain laterality, unspecified chronicity, unspecified whether sciatica present  Pain in thoracic spine  Difficulty in walking, not elsewhere classified  Muscle weakness (generalized)  ONSET DATE: 7  months ago  SUBJECTIVE:                                                                                                                                                                                           SUBJECTIVE STATEMENT: Patient reports her knees are still bothering her at 7/10.  States she prepared a meal over the weekend including a meat and several vegetables which took approx 20 minutes and reports she had to take 3 seated rest breaks due to her back hurting.  PERTINENT HISTORY:  Right TKA in 2002 Bilateral knee pain; injections every 3 months with Dr. Aline Brochure osteoporosis  PAIN:  Are you having pain? Yes: NPRS scale: 8/10 Pain location: Lt knee Pain description: aching Aggravating factors: activity Relieving factors: rest, medication   PRECAUTIONS: Fall  WEIGHT BEARING RESTRICTIONS No  FALLS:  Has patient fallen in last 6 months? Yes. Number of falls 3  LIVING ENVIRONMENT: Lives with: lives with their daughter Lives in: House/apartment Stairs: No Has following equipment at home: Programmer, multimedia, Environmental consultant - 2 wheeled, Health visitor, Wheelchair (manual), shower chair, bed side commode, and Grab bars  OCCUPATION: retired Garment/textile technologist  PLOF: Independent; now using a [place]; now daughter assists her with household tasks but she can dress and bathe mod independently  PATIENT GOALS be able to cook without my back hurting   OBJECTIVE:   DIAGNOSTIC FINDINGS:  Scheduled for an x-ray 12/12/20  PATIENT SURVEYS:  FOTO 12/18/21 44% (was 32% at evaluation)  SCREENING FOR RED FLAGS: Bowel or bladder incontinence: Yes: urine only Spinal tumors: No Cauda equina syndrome: No Compression fracture: Yes: hx of previous compression fx  Abdominal aneurysm: No  COGNITION:  Overall cognitive status: History of cognitive impairments - at baseline     SENSATION: Reports occasional  numbness in her feet   POSTURE: rounded shoulders, forward  head, and increased thoracic kyphosis  PALPATION: Mild tenderness lower thoracic spine spinous processes and paraspinals  LUMBAR ROM:   Active  A/PROM  eval AROM 12/18/21  Flexion wfl WFL  Extension 50% available WFL  Right lateral flexion wfl WFL  Left lateral flexion wfl WFL  Right rotation wfl WFL  Left rotation wfl WFL   (Blank rows = not tested)   LOWER EXTREMITY MMT:    MMT Right eval Right 12/18/21 Left eval Left  12/18/21  Hip flexion 4+ 5 4+ 5  Hip extension 3- '4 4 4  ' Hip abduction 3+ 4+ 3+ 4+  Hip adduction      Hip internal rotation      Hip external rotation      Knee flexion      Knee extension 4 4+ 5 5  Ankle dorsiflexion '5 5 5 5  ' Ankle plantarflexion      Ankle inversion      Ankle eversion       (Blank rows = not tested)   FUNCTIONAL TESTS:  5 times sit to stand: 12/18/21: 13 seconds without UE's, 17 seconds with UE assist from standard chair (was 26 sec at evaluation)  GAIT:    12/18/21:  2MWT completed with RW 226 feet with forward trunk, shuffling of LE's and decreased stride length.   At evaluation:  Distance walked: 60 ft Assistive device utilized: Environmental consultant - 2 wheeled Level of assistance: SBA Comments: slow gait speed; slight forward flexed trunk    TODAY'S TREATMENT  12/18/21 Functional measures retested (STS, 2MWT, MMT, ROM) Progress note/education completed Gait using SPC independent without LOB or deviations   12/13/21 Sitting:  cervical retractions 10X  Scapular retractions 10X  Scapular retractions RTB 2 x 10 Supine: decompression exercises 2-5 5" hold x 10  Decompression RTB 1-4 10X each  Bridge 10X  SLR 10X each   12/11/21 Supine: Decompression exercises 10 x 5" hold Head press Shoulder press Leg lengthener Leg press  LTR x 10 Glute squeeze 5" hold x 10 Bridge x 10 SLR x 10   Sitting: Cervical retractions x 10 Scapular retractionx x 10    Scapular retraction with RTB 2 x 10    12/06/21: Supine:   Decompression 2-5 10X 3" holds each  Decompression with RTB 1-4 10X each  Bridge 10X 2 sets  LTR 10X5"  SLR 10X each 2 sets Seated:  Wbacks 10X3"   Cervical excursions 10X each  Cervical retractions 10X each  12/04/21: Supine:  Decompression 2-5 10X 3" holds each  Decompression with RTB 1-4 10X each  Bridge 10X   LTR 10X5"  SLR 10X each Seated:  Wbacks 10X3"   Cervical excursions 5X each  Cervical retractions 10X each  11/30/21: Supine: Decompression exercises: Head press x 10 Shoulder press x 10 Glute set x 10 Leg press x 10 Leg lengthener x 10 Bridge x 10 LTR x 10 Sitting: Cervical flexion and extension, rotation and side bending x 10 each Cervical retractions x 10  11/21/21: Review of HEP and goals   PATIENT EDUCATION:  Education details: 6/13:  maintaining good posture eval: updated HEP Person educated: Patient Education method: Explanation, Demonstration, Tactile cues, Verbal cues, and Handouts Education comprehension: verbalized understanding, returned demonstration, verbal cues required, tactile cues required, and needs further  education   HOME EXERCISE PROGRAM: 12/18/21:  LAQ, sit to stands, increasing ambulation 12/04/21:  decompression tband exercises with RTB 11/30/2021:  decompression exercises Evaluation: Access Code: [email or web address] URL: [email or web address] Date: 11/21/2021 Prepared by: [facility]  Exercises - Supine Bridge  - 2 x daily - 7 x weekly - 1 sets - 10 reps - Prone Hip Extension  - 2 x daily - 7 x weekly - 1 sets - 10 reps - Supine Scapular Retraction  - 2 x daily - 7 x weekly - 1 sets - 10 reps  ASSESSMENT:  CLINICAL IMPRESSION: Progress note completed this session.  Pt has made great functional gains in strength, ROM and overall functional mobility.  Pt is now able to prep a meal with seated rest breaks, is driving 30 minutes and can ambulate with SPC in community.  Pt reports not using an AD indoors, RW outdoors at this point.   Pt has met all goals except for 50% overall improvement.  Pt only reports a 10% subjective improvement according to her pain, however her function surpasses to greater than 50% improvement.  Pt feels she is ready for discharge at this time and reports no further skilled need or questions regarding her HEP.    OBJECTIVE IMPAIRMENTS Abnormal gait, decreased activity tolerance, decreased balance, decreased endurance, decreased mobility, difficulty walking, decreased ROM, decreased strength, decreased safety awareness, hypomobility, increased fascial restrictions, impaired perceived functional ability, impaired flexibility, postural dysfunction, and pain.   ACTIVITY LIMITATIONS carrying, lifting, bending, standing, squatting, sleeping, stairs, transfers, reach over head, and locomotion level  PARTICIPATION LIMITATIONS: meal prep, cleaning, laundry, medication management, shopping, community activity, and yard work  PERSONAL FACTORS Age, Fitness, and 1-2 comorbidities: osteoporosis and HBP  are also affecting patient's functional outcome.   REHAB POTENTIAL: Good  CLINICAL DECISION MAKING: Stable/uncomplicated  EVALUATION COMPLEXITY: Low   GOALS: Goals reviewed with patient? No  SHORT TERM GOALS: Target date: 12/14/2021   patient will be independent with initial HEP Baseline: Goal status: MET  2.  Patient will increase right hip extension to 3+ to improve patient ability to transfer sit to stand efficiently. Baseline: 3- Goal status: MET   LONG TERM GOALS: Target date: 12/28/2021  Patient will report at least 50% improvement in overall symptoms and/or function to demonstrate improved functional mobility  Baseline:  Goal status: NOT MET; reported overall 10% improvement  2.   Patient will improve FOTO score to predicted value to demonstrate improved functional mobility.  Baseline: 32 Goal status: MET  3.  Patient will improve 5 x STS score from 26 sec to 22 sec to demonstrate  improved functional mobility and increased lower extremity strength.  Baseline: 26 sec Goal status: MET  4.  Patient will be able to stand x 15 min to cook a light meal without back pain greater than 2/10 to increase independence in the home.  Baseline: 5 min Goal status: MET; with rest breaks  5.  Patient will be independent with advanced HEP and self management strategies to improve quality of life and functional outcomes.  Baseline:  Goal status: MET   PLAN: PT FREQUENCY: 2x/week  PT DURATION: 4 weeks  PLANNED INTERVENTIONS: Therapeutic exercises, Therapeutic activity, Neuromuscular re-education, Balance training, Gait training, Patient/Family education, Joint manipulation, Joint mobilization, Stair training, Vestibular training, Canalith repositioning, Visual/preceptual remediation/compensation, Orthotic/Fit training, DME instructions, Aquatic Therapy, Dry Needling, Electrical stimulation, Spinal manipulation, Spinal mobilization, Cryotherapy, Moist heat, Taping, Traction, Ultrasound, Biofeedback, Ionotophoresis 24m/ml Dexamethasone, Manual therapy, and Re-evaluation.  PLAN FOR  NEXT SESSION:  discharge to HEP.    8:12 AM, 12/24/21 Leif Loflin Small Lynch MPT [facility] [place] [id number] Ph:[phone]   Teena Irani, PTA/CLT [place] Ph: [phone]  10:49 AM, 12/18/21

## 2021-12-20 ENCOUNTER — Encounter (HOSPITAL_COMMUNITY): Payer: Medicare Other | Admitting: Physical Therapy

## 2021-12-25 ENCOUNTER — Other Ambulatory Visit: Payer: Self-pay | Admitting: Student

## 2021-12-31 ENCOUNTER — Other Ambulatory Visit (HOSPITAL_COMMUNITY): Payer: Self-pay | Admitting: Internal Medicine

## 2021-12-31 DIAGNOSIS — Z1231 Encounter for screening mammogram for malignant neoplasm of breast: Secondary | ICD-10-CM

## 2022-01-01 DIAGNOSIS — G4701 Insomnia due to medical condition: Secondary | ICD-10-CM | POA: Diagnosis not present

## 2022-01-01 DIAGNOSIS — I951 Orthostatic hypotension: Secondary | ICD-10-CM | POA: Diagnosis not present

## 2022-01-01 DIAGNOSIS — M545 Low back pain, unspecified: Secondary | ICD-10-CM | POA: Diagnosis not present

## 2022-01-01 DIAGNOSIS — G43711 Chronic migraine without aura, intractable, with status migrainosus: Secondary | ICD-10-CM | POA: Diagnosis not present

## 2022-01-01 DIAGNOSIS — R296 Repeated falls: Secondary | ICD-10-CM | POA: Diagnosis not present

## 2022-01-10 DIAGNOSIS — J302 Other seasonal allergic rhinitis: Secondary | ICD-10-CM | POA: Diagnosis not present

## 2022-01-10 DIAGNOSIS — I10 Essential (primary) hypertension: Secondary | ICD-10-CM | POA: Diagnosis not present

## 2022-01-10 DIAGNOSIS — J069 Acute upper respiratory infection, unspecified: Secondary | ICD-10-CM | POA: Diagnosis not present

## 2022-01-21 DIAGNOSIS — K219 Gastro-esophageal reflux disease without esophagitis: Secondary | ICD-10-CM | POA: Diagnosis not present

## 2022-01-21 DIAGNOSIS — E785 Hyperlipidemia, unspecified: Secondary | ICD-10-CM | POA: Diagnosis not present

## 2022-01-21 DIAGNOSIS — I129 Hypertensive chronic kidney disease with stage 1 through stage 4 chronic kidney disease, or unspecified chronic kidney disease: Secondary | ICD-10-CM | POA: Diagnosis not present

## 2022-01-21 DIAGNOSIS — N1831 Chronic kidney disease, stage 3a: Secondary | ICD-10-CM | POA: Diagnosis not present

## 2022-01-23 ENCOUNTER — Other Ambulatory Visit: Payer: Self-pay | Admitting: Gastroenterology

## 2022-01-23 DIAGNOSIS — R1310 Dysphagia, unspecified: Secondary | ICD-10-CM

## 2022-01-23 DIAGNOSIS — K219 Gastro-esophageal reflux disease without esophagitis: Secondary | ICD-10-CM

## 2022-01-24 DIAGNOSIS — I10 Essential (primary) hypertension: Secondary | ICD-10-CM | POA: Diagnosis not present

## 2022-01-24 NOTE — Telephone Encounter (Signed)
Patient is taking pantoprazole not rabeprazole. Please make sure pharmacy is aware.

## 2022-01-25 ENCOUNTER — Ambulatory Visit (INDEPENDENT_AMBULATORY_CARE_PROVIDER_SITE_OTHER): Payer: Medicare Other | Admitting: Orthopedic Surgery

## 2022-01-25 VITALS — BP 158/75 | HR 58

## 2022-01-25 DIAGNOSIS — M7052 Other bursitis of knee, left knee: Secondary | ICD-10-CM | POA: Diagnosis not present

## 2022-01-25 DIAGNOSIS — M1712 Unilateral primary osteoarthritis, left knee: Secondary | ICD-10-CM | POA: Diagnosis not present

## 2022-01-25 MED ORDER — METHYLPREDNISOLONE ACETATE 40 MG/ML IJ SUSP
40.0000 mg | Freq: Once | INTRAMUSCULAR | Status: AC
  Administered 2022-01-25: 40 mg via INTRAMUSCULAR

## 2022-01-25 MED ORDER — METHYLPREDNISOLONE ACETATE 40 MG/ML IJ SUSP
40.0000 mg | Freq: Once | INTRAMUSCULAR | Status: AC
  Administered 2022-01-25: 40 mg via INTRA_ARTICULAR

## 2022-01-25 NOTE — Patient Instructions (Signed)

## 2022-01-25 NOTE — Progress Notes (Signed)
Chief Complaint  Patient presents with   Knee Pain    LT knee Request injection   The patient has requested an injection bursa and knee joint   PROCEDURE:   Complaint left knee pain   Diagnosis oa left knee and chr pain left knee and bursitis left knee   After appropriate timeout for site confirmation medication confirmation,  The left  knee joint and pes bursa were prepped with alcohol and ethyl chloride spray.  The injection was performed at the pes burs and left knee joint   Medication Depomedrol 40 mg and 1% lidocaine plain   There were no complications  The patient was observed for any reactions there were none and the patient was discharged.  Encounter Diagnoses  Name Primary?   Pes anserinus bursitis of left knee Yes   Primary osteoarthritis of left knee

## 2022-01-25 NOTE — Addendum Note (Signed)
Addended byCandice Camp on: 01/25/2022 10:19 AM   Modules accepted: Orders

## 2022-01-29 DIAGNOSIS — G43711 Chronic migraine without aura, intractable, with status migrainosus: Secondary | ICD-10-CM | POA: Diagnosis not present

## 2022-01-30 ENCOUNTER — Ambulatory Visit: Payer: Medicare Other | Admitting: Cardiology

## 2022-01-30 ENCOUNTER — Encounter: Payer: Self-pay | Admitting: Cardiology

## 2022-01-30 VITALS — BP 192/98 | HR 78 | Ht 62.0 in | Wt 188.0 lb

## 2022-01-30 DIAGNOSIS — I10 Essential (primary) hypertension: Secondary | ICD-10-CM

## 2022-01-30 DIAGNOSIS — I5032 Chronic diastolic (congestive) heart failure: Secondary | ICD-10-CM | POA: Diagnosis not present

## 2022-01-30 DIAGNOSIS — Z79899 Other long term (current) drug therapy: Secondary | ICD-10-CM | POA: Diagnosis not present

## 2022-01-30 MED ORDER — FUROSEMIDE 40 MG PO TABS: 40.0000 mg | ORAL_TABLET | Freq: Every day | ORAL | 3 refills | Status: DC

## 2022-01-30 MED ORDER — SPIRONOLACTONE 25 MG PO TABS: 25.0000 mg | ORAL_TABLET | Freq: Every day | ORAL | 3 refills | Status: DC

## 2022-01-30 NOTE — Progress Notes (Signed)
Clinical Summary Ms. Rinn is a 76 y.o.female  seen today for follow up of the following medical problems.    1. HTN - not taking atenolol, has some fatigue she attributes to it - pcp incrased losartan to '100mg'$  daily - compliant with meds     2. Chronic diastolic heart failure - echo 08/2014 LVEF 60-65%, abnormal diastolic function indeterminate grade.     12/2020 LVEF 53-66%, indet diastolic   - stop taking lasix 3 months ago due to recurrent issues with low potassium.  - she denies any fluid retension since stopping.       3.Hyperlipidemia - 03/2021 TC 148 TG 96 HDL 54 LDL 76 - labs followed by pcp   Past Medical History:  Diagnosis Date   Adenomatous colon polyp 2006   excised in 2006 & 2010Due surveillance 06/2013   Anxiety    Anxiety and depression    Asthma    Breast mass    right nipple bengn mass per patient   Breast tumor    Chest discomfort    Chronic back pain    Chronic constipation    Complication of anesthesia    Depression    Diverticulosis    Fibromyalgia    Gastroesophageal reflux disease    Hiatal hernia    History of benign esophageal tumor 2010   granular cell esophageal tumor (Dx 06/2008), resected via EMR 2011, due repeat EGD 02/2012   Hyperlipidemia    Hypertension    Insomnia    Migraines    PONV (postoperative nausea and vomiting)    Psychosis ([place])    Sleep apnea    Stop Bang score of 5. Pt said she was told by Dr. Merlene Laughter that she had "a little bit" of sleep apena, but not bad enough to treat.   Thyroid nodule    Tumor of esophagus      Allergies  Allergen Reactions   Elavil [Amitriptyline] Other (See Comments)    Felt really nervous and felt like something was hold her feet or arm and/ or AM headache on it and back on it and went away off it.    Abilify [Aripiprazole] Other (See Comments)    Dystonic reaction   Codeine Hives, Nausea Only and Other (See Comments)    Feels funny   Latex Hives   Penicillins Hives,  Itching and Other (See Comments)    Has patient had a PCN reaction causing immediate rash, facial/tongue/throat swelling, SOB or lightheadedness with hypotension: Yes Has patient had a PCN reaction causing severe rash involving mucus membranes or skin necrosis: No Has patient had a PCN reaction that required hospitalization No Has patient had a PCN reaction occurring within the last 10 years: No If all of the above answers are "NO", then may proceed with Cephalosporin use.    Polyethylene Glycol Other (See Comments)    Per allergy test   Sulfonamide Derivatives Nausea And Vomiting   Cymbalta [Duloxetine Hcl] Rash   Remeron [Mirtazapine] Other (See Comments)    Caused lots of strange, weird, crazy dreams.     Current Outpatient Medications  Medication Sig Dispense Refill   acetaminophen (TYLENOL) 500 MG tablet Take 2 tablets (1,000 mg total) by mouth every 8 (eight) hours as needed for mild pain, moderate pain or fever.     AIMOVIG 70 MG/ML SOAJ Inject 70 mg into the skin every 30 (thirty) days.     albuterol (PROVENTIL HFA;VENTOLIN HFA) 108 (90 BASE) MCG/ACT inhaler Inhale  2 puffs into the lungs every 6 (six) hours as needed for wheezing or shortness of breath.      amLODipine (NORVASC) 10 MG tablet Take 10 mg by mouth daily.     atenolol (TENORMIN) 50 MG tablet Take 25 mg by mouth daily. (Patient not taking: Reported on 01/25/2022)     cetirizine (ZYRTEC) 10 MG tablet Take 10 mg by mouth daily as needed for allergies.     Ensure Max Protein (ENSURE MAX PROTEIN) LIQD Take 330 mLs (11 oz total) by mouth 2 (two) times daily.     famotidine (PEPCID) 20 MG tablet Take 2 tablets (40 mg total) by mouth at bedtime. (Patient taking differently: Take by mouth at bedtime.) 60 tablet 3   furosemide (LASIX) 40 MG tablet Take 1 tablet (40 mg total) by mouth daily. 30 tablet 2   levothyroxine (SYNTHROID) 50 MCG tablet Take 50 mcg by mouth daily.     losartan (COZAAR) 100 MG tablet Take 100 mg by mouth  daily.     losartan (COZAAR) 25 MG tablet Take 1 tablet (25 mg total) by mouth daily. (Patient not taking: Reported on 01/25/2022) 90 tablet 3   lovastatin (MEVACOR) 40 MG tablet Take 40 mg by mouth at bedtime.     pantoprazole (PROTONIX) 40 MG tablet Take 1 tablet (40 mg total) by mouth 2 (two) times daily. 60 tablet 2   zolpidem (AMBIEN) 10 MG tablet Take 10 mg by mouth at bedtime. (Patient not taking: Reported on 01/25/2022)     No current facility-administered medications for this visit.     Past Surgical History:  Procedure Laterality Date   ABDOMINAL HYSTERECTOMY     BALLOON DILATION  08/23/2021   Procedure: BALLOON DILATION;  Surgeon: Eloise Harman, DO;  [facility];  Service: Endoscopy;;   BIOPSY  10/14/2019   Procedure: BIOPSY;  Surgeon: Daneil Dolin, MD;  [facility]: [facility];  Service: Endoscopy;;   BRAVO [place] STUDY  12/11/2011   Procedure: BRAVO [place];  Surgeon: Daneil Dolin, MD;  [facility];  Service: Endoscopy;  Laterality: N/A;   BREAST EXCISIONAL BIOPSY  1990s, 2012   Left x2-sclerosing ductal papilloma-2012   CHOLECYSTECTOMY N/A 04/09/2013   Procedure: LAPAROSCOPIC CHOLECYSTECTOMY;  Surgeon: Jamesetta So, MD;  Location: [facility];  Service: General;  Laterality: N/A;   COLONOSCOPY  06/2008, 06/2011   sigmoid tics, tubular adenoma; 2013: anal canal hemorrhoids   COLONOSCOPY  07/10/2011   Anal canal hemorrhoids likely the cause of hematochezia in the setting of constipation; otherwise normal rectum ;submucosal  petechiae in left colon of doubtful clinical significance; otherwise, normal colon   COLONOSCOPY WITH PROPOFOL N/A 11/10/2017   cancelled in pre-op   COLONOSCOPY WITH PROPOFOL N/A 07/23/2018   Procedure: COLONOSCOPY WITH PROPOFOL;  Surgeon: Daneil Dolin, MD; 1 tubular adenoma, diverticulosis in the sigmoid and descending colon, nonbleeding internal hemorrhoids.  No recommendations to repeat due to age.   ESOPHAGEAL BRUSHING   08/23/2021   Procedure: ESOPHAGEAL BRUSHING;  Surgeon: Eloise Harman, DO;  [facility];  Service: Endoscopy;;   ESOPHAGEAL DILATION N/A 05/08/2015   Procedure: ESOPHAGEAL DILATION;  Surgeon: Daneil Dolin, MD;  Location: [facility];  Service: Endoscopy;  Laterality: N/AVenia Minks 54/56   ESOPHAGOGASTRODUODENOSCOPY  12/11/2011   [id number] Schatzki's ring; otherwise normal/Small hiatal hernia. Antral and bulbar erosions   ESOPHAGOGASTRODUODENOSCOPY (EGD) WITH PROPOFOL N/A 05/08/2015   Dr. Gala Romney: mild erosive reflux esophagitis, non-critical  Schatzki's ring s/p dilation. Hiatal hernia.    ESOPHAGOGASTRODUODENOSCOPY (EGD) WITH PROPOFOL N/A 10/14/2019   Procedure: ESOPHAGOGASTRODUODENOSCOPY (EGD) WITH PROPOFOL;  Surgeon: Daneil Dolin, MD;  Nonobstructing Schatzki ring, small hiatal hernia, abnormal gastric mucosa s/p biopsied, normal first and second portion of the duodenum.  Pathology with chronic inactive gastritis, no H. pylori.   ESOPHAGOGASTRODUODENOSCOPY (EGD) WITH PROPOFOL N/A 08/23/2021   Surgeon: Eloise Harman, DO;   white speckled mucosa in the esophagus s/p cells obtained for cytology (KOH prep negative), medium-sized hiatal hernia, grade A reflux esophagitis, mild Schatzki's ring s/p dilation, normal examined stomach and duodenum.   EUS  08/2010   Dr [facility] with EGD. Retained food. No recurrent esophageal lesion, bx negative.   IR KYPHO THORACIC WITH BONE BIOPSY  10/17/2020   IR RADIOLOGIST EVAL & MGMT  09/12/2020   LUNG BIOPSY     negative   PARATHYROIDECTOMY  09/2015   [facility].    PARATHYROIDECTOMY     POLYPECTOMY  07/23/2018   Procedure: POLYPECTOMY;  Surgeon: Daneil Dolin, MD;  [facility];  Service: Endoscopy;;  colon   RIGHT OOPHORECTOMY     benign disease   SHOULDER ARTHROSCOPY     Right; bone spurs removed   TONSILLECTOMY     TOTAL KNEE ARTHROPLASTY  2002   Right; previous arthroscopic surgery     Allergies  Allergen  Reactions   Elavil [Amitriptyline] Other (See Comments)    Felt really nervous and felt like something was hold her feet or arm and/ or AM headache on it and back on it and went away off it.    Abilify [Aripiprazole] Other (See Comments)    Dystonic reaction   Codeine Hives, Nausea Only and Other (See Comments)    Feels funny   Latex Hives   Penicillins Hives, Itching and Other (See Comments)    Has patient had a PCN reaction causing immediate rash, facial/tongue/throat swelling, SOB or lightheadedness with hypotension: Yes Has patient had a PCN reaction causing severe rash involving mucus membranes or skin necrosis: No Has patient had a PCN reaction that required hospitalization No Has patient had a PCN reaction occurring within the last 10 years: No If all of the above answers are "NO", then may proceed with Cephalosporin use.    Polyethylene Glycol Other (See Comments)    Per allergy test   Sulfonamide Derivatives Nausea And Vomiting   Cymbalta [Duloxetine Hcl] Rash   Remeron [Mirtazapine] Other (See Comments)    Caused lots of strange, weird, crazy dreams.      Family History  Problem Relation Age of Onset   Stroke Father    Alcohol abuse Father    Heart attack Mother    Depression Mother    Anxiety disorder Mother    Colon cancer Paternal Aunt    Colon cancer Paternal Uncle    Dementia Maternal Uncle    ADD / ADHD Neg Hx    Bipolar disorder Neg Hx    Drug abuse Neg Hx    OCD Neg Hx    Paranoid behavior Neg Hx    Schizophrenia Neg Hx    Seizures Neg Hx    Sexual abuse Neg Hx    Physical abuse Neg Hx      Social History Ms. Moulin reports that she has never smoked. She has never used smokeless tobacco. Ms. Kukla reports no history of alcohol use.   Review of Systems CONSTITUTIONAL: No weight loss, fever, chills, weakness or fatigue.  HEENT: Eyes: No visual loss, blurred vision, double vision or yellow sclerae.No hearing loss, sneezing, congestion, runny nose  or sore throat.  SKIN: No rash or itching.  CARDIOVASCULAR: per hpi RESPIRATORY: No shortness of breath, cough or sputum.  GASTROINTESTINAL: No anorexia, nausea, vomiting or diarrhea. No abdominal pain or blood.  GENITOURINARY: No burning on urination, no polyuria NEUROLOGICAL: No headache, dizziness, syncope, paralysis, ataxia, numbness or tingling in the extremities. No change in bowel or bladder control.  MUSCULOSKELETAL: No muscle, back pain, joint pain or stiffness.  LYMPHATICS: No enlarged nodes. No history of splenectomy.  PSYCHIATRIC: No history of depression or anxiety.  ENDOCRINOLOGIC: No reports of sweating, cold or heat intolerance. No polyuria or polydipsia.  Marland Kitchen   Physical Examination Today's Vitals   01/30/22 1139  BP: (!) 192/98  Pulse: 78  SpO2: 98%  Weight: 188 lb (85.3 kg)  Height: '5\' 2"'$  (1.575 m)   Body mass index is 34.39 kg/m.  Gen: resting comfortably, no acute distress HEENT: no scleral icterus, pupils equal round and reactive, no palptable cervical adenopathy,  CV: RRR, no m/r/ gno jvd Resp: Clear to auscultation bilaterally GI: abdomen is soft, non-tender, non-distended, normal bowel sounds, no hepatosplenomegaly MSK: extremities are warm, no edema.  Skin: warm, no rash Neuro:  no focal deficits Psych: appropriate affect   Diagnostic Studies  02/17/13 Event monitor: baseline sinus brady in 50s, symptoms of dizziness correlate w/ sinus brady in 50s. Occasional PVCs, 1 PVC couplet.     01/2012 Carotid US: minimal plaque at bifurcations, no stenosis     10/2014 echo Study Conclusions  - Left ventricle: The cavity size was normal. Wall thickness was   increased in a pattern of mild LVH. Systolic function was normal.   The estimated ejection fraction was in the range of 60% to 65%.   Abnormal diastoilc function, indeterminate grade. - Aortic valve: There was mild regurgitation. Valve area (VTI):   2.48 cm^2. Valve area (Vmax): 2.89 cm^2. -  Technically adequate study.   Assessment and Plan  1. HTN - elevated bp's - we will stop atenolol due to fatigue - start aldactone '25mg'$  daily. Continue losartan, norvasc.        2. Chronic diastolic HF - had been on lasix '80mg'$  in AM and '40mg'$  in PM, she stopped taking on her own because of recurrent issues with low potassium despite being on supplement - volume wise seems to be doing fairly well of lasix, though some recent SOB. Will start back at lower dose '40mg'$  daily, check bmet/mg 2 weeks. Perhaps with aldactone K will be more stable.          Arnoldo Lenis, M.D.,

## 2022-01-30 NOTE — Patient Instructions (Addendum)
Medication Instructions:  Your physician has recommended you make the following change in your medication:  Stop Atenolol Start Spironolactone 25 mg tablets daily Start Lasix 40 mg tablets daily    Labwork: In 2 Weeks: -BMET -MAG  Testing/Procedures: None  Follow-Up: Follow up with Dr. Harl Bowie in 3 months  Any Other Special Instructions Will Be Listed Below (If Applicable).  Update our office Friday with blood pressures.    If you need a refill on your cardiac medications before your next appointment, please call your pharmacy.

## 2022-02-04 NOTE — Telephone Encounter (Signed)
Pharmacy was notified.

## 2022-02-05 DIAGNOSIS — I951 Orthostatic hypotension: Secondary | ICD-10-CM | POA: Diagnosis not present

## 2022-02-05 DIAGNOSIS — R296 Repeated falls: Secondary | ICD-10-CM | POA: Diagnosis not present

## 2022-02-05 DIAGNOSIS — M545 Low back pain, unspecified: Secondary | ICD-10-CM | POA: Diagnosis not present

## 2022-02-05 DIAGNOSIS — G4701 Insomnia due to medical condition: Secondary | ICD-10-CM | POA: Diagnosis not present

## 2022-02-07 DIAGNOSIS — I10 Essential (primary) hypertension: Secondary | ICD-10-CM | POA: Diagnosis not present

## 2022-02-11 ENCOUNTER — Ambulatory Visit (HOSPITAL_COMMUNITY): Payer: Medicare Other

## 2022-02-12 ENCOUNTER — Other Ambulatory Visit (HOSPITAL_COMMUNITY)
Admission: RE | Admit: 2022-02-12 | Discharge: 2022-02-12 | Disposition: A | Discharge status: Home / Self Care | Payer: Medicare Other | Source: Ambulatory Visit | Attending: Student | Admitting: Student

## 2022-02-12 DIAGNOSIS — Z79899 Other long term (current) drug therapy: Secondary | ICD-10-CM | POA: Insufficient documentation

## 2022-02-12 LAB — BASIC METABOLIC PANEL
Anion gap: 10 (ref 5–15)
BUN: 24 mg/dL — ABNORMAL HIGH (ref 8–23)
CO2: 24 mmol/L (ref 22–32)
Calcium: 9.4 mg/dL (ref 8.9–10.3)
Chloride: 102 mmol/L (ref 98–111)
Creatinine, Ser: 0.93 mg/dL (ref 0.44–1.00)
GFR, Estimated: >60 mL/min (ref >60)
Glucose, Bld: 104 mg/dL — ABNORMAL HIGH (ref 70–99)
Potassium: 3.2 mmol/L — ABNORMAL LOW (ref 3.5–5.1)
Sodium: 136 mmol/L (ref 135–145)

## 2022-02-12 LAB — MAGNESIUM: Magnesium: 2 mg/dL (ref 1.7–2.4)

## 2022-02-14 ENCOUNTER — Ambulatory Visit (HOSPITAL_COMMUNITY)
Admission: RE | Admit: 2022-02-14 | Discharge: 2022-02-14 | Disposition: A | Discharge status: Home / Self Care | Payer: Medicare Other | Source: Ambulatory Visit | Attending: Internal Medicine | Admitting: Internal Medicine

## 2022-02-14 DIAGNOSIS — Z1231 Encounter for screening mammogram for malignant neoplasm of breast: Secondary | ICD-10-CM | POA: Diagnosis not present

## 2022-02-15 DIAGNOSIS — G2401 Drug induced subacute dyskinesia: Secondary | ICD-10-CM | POA: Diagnosis not present

## 2022-02-15 DIAGNOSIS — F32A Depression, unspecified: Secondary | ICD-10-CM | POA: Diagnosis not present

## 2022-02-15 DIAGNOSIS — G4700 Insomnia, unspecified: Secondary | ICD-10-CM | POA: Diagnosis not present

## 2022-02-15 DIAGNOSIS — Z79899 Other long term (current) drug therapy: Secondary | ICD-10-CM | POA: Diagnosis not present

## 2022-03-04 ENCOUNTER — Telehealth: Payer: Self-pay | Admitting: *Deleted

## 2022-03-04 DIAGNOSIS — Z79899 Other long term (current) drug therapy: Secondary | ICD-10-CM

## 2022-03-04 DIAGNOSIS — E876 Hypokalemia: Secondary | ICD-10-CM

## 2022-03-04 DIAGNOSIS — I10 Essential (primary) hypertension: Secondary | ICD-10-CM

## 2022-03-04 MED ORDER — POTASSIUM CHLORIDE CRYS ER 20 MEQ PO TBCR: 40.0000 meq | EXTENDED_RELEASE_TABLET | Freq: Every day | ORAL | 6 refills | Status: DC

## 2022-03-04 NOTE — Telephone Encounter (Signed)
-----   Message from Arnoldo Lenis, MD sent at 02/19/2022 11:41 AM EDT ----- Potassium is mildly low, start potassium chloride 8mq daily please and repeat bmet/mg in 2 weeks  JZandra AbtsMD

## 2022-03-04 NOTE — Telephone Encounter (Signed)
Laurine Blazer, LPN  9/90/6893 40:68 AM EDT Back to Top    Notified, copy to pcp.  New prescription sent to [facility] now.  She will do labs at [facility] - orders mailed to home.

## 2022-03-05 DIAGNOSIS — G43711 Chronic migraine without aura, intractable, with status migrainosus: Secondary | ICD-10-CM | POA: Diagnosis not present

## 2022-03-13 ENCOUNTER — Encounter: Payer: Self-pay | Admitting: *Deleted

## 2022-03-18 DIAGNOSIS — Z79899 Other long term (current) drug therapy: Secondary | ICD-10-CM | POA: Diagnosis not present

## 2022-03-18 DIAGNOSIS — E876 Hypokalemia: Secondary | ICD-10-CM | POA: Diagnosis not present

## 2022-03-18 DIAGNOSIS — I10 Essential (primary) hypertension: Secondary | ICD-10-CM | POA: Diagnosis not present

## 2022-03-19 LAB — BASIC METABOLIC PANEL
BUN/Creatinine Ratio: 16 (ref 12–28)
BUN: 18 mg/dL (ref 8–27)
CO2: 18 mmol/L — ABNORMAL LOW (ref 20–29)
Calcium: 10.3 mg/dL (ref 8.7–10.3)
Chloride: 101 mmol/L (ref 96–106)
Creatinine, Ser: 1.1 mg/dL — ABNORMAL HIGH (ref 0.57–1.00)
Glucose: 102 mg/dL — ABNORMAL HIGH (ref 70–99)
Potassium: 4.6 mmol/L (ref 3.5–5.2)
Sodium: 138 mmol/L (ref 134–144)
eGFR: 52 mL/min/{1.73_m2} — ABNORMAL LOW (ref >59)

## 2022-03-19 LAB — MAGNESIUM: Magnesium: 2.4 mg/dL — ABNORMAL HIGH (ref 1.6–2.3)

## 2022-03-25 DIAGNOSIS — I10 Essential (primary) hypertension: Secondary | ICD-10-CM | POA: Diagnosis not present

## 2022-03-25 DIAGNOSIS — R7301 Impaired fasting glucose: Secondary | ICD-10-CM | POA: Diagnosis not present

## 2022-03-25 DIAGNOSIS — R7303 Prediabetes: Secondary | ICD-10-CM | POA: Diagnosis not present

## 2022-03-25 DIAGNOSIS — E039 Hypothyroidism, unspecified: Secondary | ICD-10-CM | POA: Diagnosis not present

## 2022-03-28 ENCOUNTER — Encounter: Payer: Self-pay | Admitting: Internal Medicine

## 2022-03-28 ENCOUNTER — Other Ambulatory Visit: Payer: Self-pay | Admitting: Gastroenterology

## 2022-03-28 DIAGNOSIS — I10 Essential (primary) hypertension: Secondary | ICD-10-CM | POA: Diagnosis not present

## 2022-03-28 DIAGNOSIS — E876 Hypokalemia: Secondary | ICD-10-CM | POA: Diagnosis not present

## 2022-03-28 DIAGNOSIS — R809 Proteinuria, unspecified: Secondary | ICD-10-CM | POA: Insufficient documentation

## 2022-03-28 DIAGNOSIS — M1990 Unspecified osteoarthritis, unspecified site: Secondary | ICD-10-CM | POA: Insufficient documentation

## 2022-03-28 DIAGNOSIS — G43909 Migraine, unspecified, not intractable, without status migrainosus: Secondary | ICD-10-CM | POA: Diagnosis not present

## 2022-03-28 DIAGNOSIS — G4700 Insomnia, unspecified: Secondary | ICD-10-CM | POA: Diagnosis not present

## 2022-03-28 DIAGNOSIS — R609 Edema, unspecified: Secondary | ICD-10-CM | POA: Diagnosis not present

## 2022-03-28 DIAGNOSIS — Z0000 Encounter for general adult medical examination without abnormal findings: Secondary | ICD-10-CM | POA: Diagnosis not present

## 2022-03-28 DIAGNOSIS — J4520 Mild intermittent asthma, uncomplicated: Secondary | ICD-10-CM | POA: Insufficient documentation

## 2022-03-28 DIAGNOSIS — R1312 Dysphagia, oropharyngeal phase: Secondary | ICD-10-CM | POA: Diagnosis not present

## 2022-03-28 DIAGNOSIS — N1831 Chronic kidney disease, stage 3a: Secondary | ICD-10-CM | POA: Diagnosis not present

## 2022-03-28 DIAGNOSIS — M1712 Unilateral primary osteoarthritis, left knee: Secondary | ICD-10-CM | POA: Insufficient documentation

## 2022-03-28 DIAGNOSIS — K219 Gastro-esophageal reflux disease without esophagitis: Secondary | ICD-10-CM

## 2022-04-02 ENCOUNTER — Telehealth: Payer: Self-pay | Admitting: *Deleted

## 2022-04-02 MED ORDER — POTASSIUM CHLORIDE CRYS ER 20 MEQ PO TBCR: 20.0000 meq | EXTENDED_RELEASE_TABLET | Freq: Every day | ORAL | Status: DC

## 2022-04-02 NOTE — Telephone Encounter (Signed)
-----   Message from Arnoldo Lenis, MD sent at 04/01/2022  9:22 AM EDT ----- Can we lower her potassium to 67mq daily. Potassium is normal but trending up from our labs and Dr HJuel Burrowrecent labs   JZandra AbtsMD

## 2022-04-02 NOTE — Telephone Encounter (Signed)
Laurine Blazer, LPN  88/67/7373 66:81 AM EDT Back to Top    Notified, copy to pcp.

## 2022-04-03 DIAGNOSIS — G4701 Insomnia due to medical condition: Secondary | ICD-10-CM | POA: Diagnosis not present

## 2022-04-03 DIAGNOSIS — M130 Polyarthritis, unspecified: Secondary | ICD-10-CM | POA: Diagnosis not present

## 2022-04-03 DIAGNOSIS — G894 Chronic pain syndrome: Secondary | ICD-10-CM | POA: Diagnosis not present

## 2022-04-03 DIAGNOSIS — G43711 Chronic migraine without aura, intractable, with status migrainosus: Secondary | ICD-10-CM | POA: Diagnosis not present

## 2022-04-03 DIAGNOSIS — Z79899 Other long term (current) drug therapy: Secondary | ICD-10-CM | POA: Diagnosis not present

## 2022-04-05 NOTE — Telephone Encounter (Signed)
Dr. Nevada Crane office calling wanting to let Dr. Harl Bowie know that he agrees and he notified the [name]

## 2022-04-16 ENCOUNTER — Other Ambulatory Visit: Payer: Self-pay | Admitting: Gastroenterology

## 2022-04-16 ENCOUNTER — Telehealth: Payer: Self-pay

## 2022-04-16 DIAGNOSIS — K219 Gastro-esophageal reflux disease without esophagitis: Secondary | ICD-10-CM

## 2022-04-16 MED ORDER — FAMOTIDINE 20 MG PO TABS: 40.0000 mg | ORAL_TABLET | Freq: Every day | ORAL | 3 refills | Status: DC

## 2022-04-16 NOTE — Telephone Encounter (Signed)
Pt called requesting a refill on famotidine. Pt was last seen on 12/12/2021.

## 2022-04-16 NOTE — Telephone Encounter (Signed)
Rx sent 

## 2022-04-17 NOTE — Progress Notes (Unsigned)
GI Office Note    Referring Provider: Celene Squibb, MD Primary Care Physician:  Celene Squibb, MD Primary Gastroenterologist: Cristopher Estimable.Rourk, MD  Date:  04/18/2022  ID:  [name], DOB May 05, 1946, MRN [id number]   Chief Complaint   Chief Complaint  Patient presents with   Follow-up    On GERD. Pt says having more burning in mouth then she does in her esophagus.    History of Present Illness  [name] is a 76 y.o. female with a history of opioid-induced chronic constipation, adenomatous colon polyps, GERD, esophageal dysphagia with Schatzki's ring, oropharyngeal dysphagia, esophageal granular cell tumor s/p EMR 2011 at [facility] presenting today for follow-up on GERD and dysphagia.  Last colonoscopy January 2020 with adenomatous colon polyps.  No repeat due to age.  History of esophageal dysphagia with EGD in April 2021 with Schatzki's ring s/p dilation.  History of oropharyngeal dysphagia demonstrated multiple prior modified barium swallow studies.  Also seen by Dr. Newman Pies at [facility] for uncontrolled GERD in 2014, had multiple EGDs, normal GES, and tried various PPIs.  Esophageal manometry was attempted but she was unable to tolerate the prep.  Seen previously earlier this year for unintentional weight loss in 3-4 months of early satiety.  She underwent EGD in March 2023 during hospitalization for hypokalemia and AKI.  She had lites to include esophageal mucosa with cytology negative for yeast, medium sized hiatal hernia, grade a reflux esophagitis, Schatzki's ring s/p dilation and normal stomach and duodenum.  She subsequently underwent bedside swallow evaluation with speech therapy revealing oropharyngeal dysphagia with prolonged AP, uncoordinated with poor bolus cohesion, moderate oral residue after initial swallow.  She had very slow A-P transition with pures and suspected delayed swallow trigger without any signs of aspiration.  She was recommended following dysphagia 2/finely  chopped meats.  Labs in March 2023 with negative stool studies, CBC and CMP without significant abnormalities.  TSH low at 0.11.  Normal lipase.  His abdomen regarding issues with diarrhea patient is also having some epigastric pain as she had been off PPI due to confusion had to take medications at discharge.  Repeat modified barium swallow study 09/27/2021 with prolonged oral transit with solids with impairment normal movement resulting piecemeal duplication with timely swallow trigger, trace vallecular and lateral channel residues after liquids which clear with repeat swallow.  She delayed transit of barium tablet into the distal esophagus which eventually cleared with liquid wash.  This point she is recommended to have any diet textures that she would like, just needs to allow more time and put forth more effort with swallowing.  No ongoing therapy recommended.  Last visit 12/12/2021 where she reported improvement in her GERD with pantoprazole 40 mg twice daily with occasional breakthrough symptoms with particular foods.  Overall she reported that Aciphex continue to work better for her than Protonix but did not want to change her medication at this time.  Taking famotidine 20 mg daily at bedtime.  Having epigastric pain 1-2 times per week without any specific trigger lasting for short amount of time.  Denies any nausea or vomiting.  Diarrhea spontaneously resolved.  No chronic medication needed.  Oropharyngeal symptoms stable.  No longer having weight loss.  No medication changes made.  Reinforced to avoid known food triggers.  Advised to follow-up in 4 months and to call if she would wish to change PPI from pantoprazole to Aciphex.  Today: GERD - Reflux is still fairly well controlled. Having burning  in her mouth with anything she puts in her mouth. Denies nausea or vomiting.   Dysphagia - Still doing softer type foods. Cream potatoes are actually harder for her to swallow. Unable to swallow bananas  and eggs. Eats a lot of turnip greens and was eating salads good but that has been hard since her tongue is swollen. Doing okay for the most part with liquids. Reports at times food and liquids are trying to come out of her nose. Tongue has been swollen and that has been affecting her swallowing. Not sure if tongue swelling started after her dose increase of her ARB.   Newly started on losartan was on 25 mg . Also recently started on levothyroxine. Also taking asteline nasal spray.   No diarrhea or weight loss. Weight has been maintained. Has been having a lot of headaches as well. Had tried tylenol but that is not working. Took excedrin along time ago and that gave her diarrhea. Neurology took her off her pain medication and states they will not tell her why. Spending a lot of time in the bed. Has seldom abdominal pain. No melena or BRBPR.    Current Outpatient Medications  Medication Sig Dispense Refill   AIMOVIG 70 MG/ML SOAJ Inject 70 mg into the skin every 30 (thirty) days.     albuterol (PROVENTIL HFA;VENTOLIN HFA) 108 (90 BASE) MCG/ACT inhaler Inhale 2 puffs into the lungs every 6 (six) hours as needed for wheezing or shortness of breath.      amLODipine (NORVASC) 10 MG tablet Take 10 mg by mouth daily.     azelastine (ASTELIN) 0.1 % nasal spray in the morning and at bedtime.     Calcium Carbonate-Vit D-Min (CALCIUM 1200 PO) Take by mouth daily.     furosemide (LASIX) 40 MG tablet Take 1 tablet (40 mg total) by mouth daily. 90 tablet 3   levocetirizine (XYZAL) 5 MG tablet Take 5 mg by mouth every evening.     levothyroxine (SYNTHROID) 25 MCG tablet Take 25 mcg by mouth daily.     losartan (COZAAR) 100 MG tablet Take 100 mg by mouth daily.     lovastatin (MEVACOR) 40 MG tablet Take 40 mg by mouth at bedtime.     Multiple Vitamins-Minerals (CENTRUM SILVER 50+WOMEN PO) Take by mouth daily.     pantoprazole (PROTONIX) 40 MG tablet TAKE ONE TABLET ('40MG'$  TOTAL) BY MOUTH TWO TIMES DAILY 60 tablet  5   potassium chloride SA (KLOR-CON M) 20 MEQ tablet Take 1 tablet (20 mEq total) by mouth daily.     spironolactone (ALDACTONE) 25 MG tablet Take 1 tablet (25 mg total) by mouth daily. 90 tablet 3   acetaminophen (TYLENOL) 500 MG tablet Take 2 tablets (1,000 mg total) by mouth every 8 (eight) hours as needed for mild pain, moderate pain or fever. (Patient not taking: Reported on 04/18/2022)     cetirizine (ZYRTEC) 10 MG tablet Take 10 mg by mouth daily as needed for allergies. (Patient not taking: Reported on 04/18/2022)     Ensure Max Protein (ENSURE MAX PROTEIN) LIQD Take 330 mLs (11 oz total) by mouth 2 (two) times daily. (Patient not taking: Reported on 04/18/2022)     famotidine (PEPCID) 20 MG tablet Take 2 tablets (40 mg total) by mouth at bedtime. (Patient not taking: Reported on 04/18/2022) 60 tablet 3   venlafaxine (EFFEXOR) 37.5 MG tablet Take 37.5 mg by mouth daily. (Patient not taking: Reported on 04/18/2022)     No current facility-administered  medications for this visit.    Past Medical History:  Diagnosis Date   Adenomatous colon polyp 2006   excised in 2006 & 2010Due surveillance 06/2013   Anxiety    Anxiety and depression    Asthma    Breast mass    right nipple bengn mass per patient   Breast tumor    Chest discomfort    Chronic back pain    Chronic constipation    Complication of anesthesia    Depression    Diverticulosis    Fibromyalgia    Gastroesophageal reflux disease    Hiatal hernia    History of benign esophageal tumor 2010   granular cell esophageal tumor (Dx 06/2008), resected via EMR 2011, due repeat EGD 02/2012   Hyperlipidemia    Hypertension    Insomnia    Migraines    PONV (postoperative nausea and vomiting)    Psychosis ([place])    Sleep apnea    Stop Bang score of 5. Pt said she was told by Dr. Merlene Laughter that she had "a little bit" of sleep apena, but not bad enough to treat.   Thyroid nodule    Tumor of esophagus     Past Surgical History:   Procedure Laterality Date   ABDOMINAL HYSTERECTOMY     BALLOON DILATION  08/23/2021   Procedure: BALLOON DILATION;  Surgeon: Eloise Harman, DO;  [facility];  Service: Endoscopy;;   BIOPSY  10/14/2019   Procedure: BIOPSY;  Surgeon: Daneil Dolin, MD;  [facility];  Service: Endoscopy;;   BRAVO [place] STUDY  12/11/2011   Procedure: BRAVO [place];  Surgeon: Daneil Dolin, MD;  [facility];  Service: Endoscopy;  Laterality: N/A;   BREAST EXCISIONAL BIOPSY  1990s, 2012   Left x2-sclerosing ductal papilloma-2012   CHOLECYSTECTOMY N/A 04/09/2013   Procedure: LAPAROSCOPIC CHOLECYSTECTOMY;  Surgeon: Jamesetta So, MD;  Location: [facility];  Service: General;  Laterality: N/A;   COLONOSCOPY  06/2008, 06/2011   sigmoid tics, tubular adenoma; 2013: anal canal hemorrhoids   COLONOSCOPY  07/10/2011   Anal canal hemorrhoids likely the cause of hematochezia in the setting of constipation; otherwise normal rectum ;submucosal  petechiae in left colon of doubtful clinical significance; otherwise, normal colon   COLONOSCOPY WITH PROPOFOL N/A 11/10/2017   cancelled in pre-op   COLONOSCOPY WITH PROPOFOL N/A 07/23/2018   Procedure: COLONOSCOPY WITH PROPOFOL;  Surgeon: Daneil Dolin, MD; 1 tubular adenoma, diverticulosis in the sigmoid and descending colon, nonbleeding internal hemorrhoids.  No recommendations to repeat due to age.   ESOPHAGEAL BRUSHING  08/23/2021   Procedure: ESOPHAGEAL BRUSHING;  Surgeon: Eloise Harman, DO;  [facility];  Service: Endoscopy;;   ESOPHAGEAL DILATION N/A 05/08/2015   Procedure: ESOPHAGEAL DILATION;  Surgeon: Daneil Dolin, MD;  Location: [facility];  Service: Endoscopy;  Laterality: N/AVenia Minks 54/56   ESOPHAGOGASTRODUODENOSCOPY  12/11/2011   NTI:[id number] Schatzki's ring; otherwise normal/Small hiatal hernia. Antral and bulbar erosions   ESOPHAGOGASTRODUODENOSCOPY (EGD) WITH PROPOFOL N/A 05/08/2015   Dr. Gala Romney: mild  erosive reflux esophagitis, non-critical Schatzki's ring s/p dilation. Hiatal hernia.    ESOPHAGOGASTRODUODENOSCOPY (EGD) WITH PROPOFOL N/A 10/14/2019   Procedure: ESOPHAGOGASTRODUODENOSCOPY (EGD) WITH PROPOFOL;  Surgeon: Daneil Dolin, MD;  Nonobstructing Schatzki ring, small hiatal hernia, abnormal gastric mucosa s/p biopsied, normal first and second portion of the duodenum.  Pathology with chronic inactive gastritis, no H. pylori.   ESOPHAGOGASTRODUODENOSCOPY (EGD) WITH PROPOFOL N/A 08/23/2021  Surgeon: Eloise Harman, DO;   white speckled mucosa in the esophagus s/p cells obtained for cytology (KOH prep negative), medium-sized hiatal hernia, grade A reflux esophagitis, mild Schatzki's ring s/p dilation, normal examined stomach and duodenum.   EUS  08/2010   Dr [facility] with EGD. Retained food. No recurrent esophageal lesion, bx negative.   IR KYPHO THORACIC WITH BONE BIOPSY  10/17/2020   IR RADIOLOGIST EVAL & MGMT  09/12/2020   LUNG BIOPSY     negative   PARATHYROIDECTOMY  09/2015   [facility].    PARATHYROIDECTOMY     POLYPECTOMY  07/23/2018   Procedure: POLYPECTOMY;  Surgeon: Daneil Dolin, MD;  [facility];  Service: Endoscopy;;  colon   RIGHT OOPHORECTOMY     benign disease   SHOULDER ARTHROSCOPY     Right; bone spurs removed   TONSILLECTOMY     TOTAL KNEE ARTHROPLASTY  2002   Right; previous arthroscopic surgery    Family History  Problem Relation Age of Onset   Stroke Father    Alcohol abuse Father    Heart attack Mother    Depression Mother    Anxiety disorder Mother    Colon cancer Paternal Aunt    Colon cancer Paternal Uncle    Dementia Maternal Uncle    ADD / ADHD Neg Hx    Bipolar disorder Neg Hx    Drug abuse Neg Hx    OCD Neg Hx    Paranoid behavior Neg Hx    Schizophrenia Neg Hx    Seizures Neg Hx    Sexual abuse Neg Hx    Physical abuse Neg Hx     Allergies as of 04/18/2022 - Review Complete 04/18/2022  Allergen Reaction Noted    Elavil [amitriptyline] Other (See Comments) 06/03/2012   Abilify [aripiprazole] Other (See Comments) 11/25/2012   Codeine Hives, Nausea Only, and Other (See Comments)    Latex Hives 11/21/2011   Penicillins Hives, Itching, and Other (See Comments)    Polyethylene glycol Other (See Comments) 12/26/2012   Sulfonamide derivatives Nausea And Vomiting    Cymbalta [duloxetine hcl] Rash 03/10/2013   Remeron [mirtazapine] Other (See Comments) 05/06/2012    Social History   Socioeconomic History   Marital status: Single    Spouse name: Not on file   Number of children: 1   Years of education: Not on file   Highest education level: Not on file  Occupational History   Occupation: disabled    Employer: RETIRED  Tobacco Use   Smoking status: Never   Smokeless tobacco: Never  Vaping Use   Vaping Use: Never used  Substance and Sexual Activity   Alcohol use: No    Alcohol/week: 0.0 standard drinks of alcohol   Drug use: No   Sexual activity: Never  Other Topics Concern   Not on file  Social History Narrative   Not on file   Social Determinants of Health   Financial Resource Strain: Not on file  Food Insecurity: Not on file  Transportation Needs: Not on file  Physical Activity: Not on file  Stress: Not on file  Social Connections: Not on file     Review of Systems   Gen: Denies fever, chills, anorexia. Denies fatigue, weakness, weight loss.  CV: Denies chest pain, palpitations, syncope, peripheral edema, and claudication. Resp: Denies dyspnea at rest, cough, wheezing, coughing up blood, and pleurisy. GI: See HPI Derm: Denies rash, itching, dry skin Psych: Denies depression, anxiety, memory loss, confusion. No  homicidal or suicidal ideation.  Heme: Denies bruising, bleeding, and enlarged lymph nodes.   Physical Exam   BP 133/89 (BP Location: Left Arm, Patient Position: Sitting, Cuff Size: Large)   Pulse 73   Temp 98.3 F (36.8 C) (Oral)   Ht '5\' 2"'$  (1.575 m)   Wt 194 lb  3.2 oz (88.1 kg)   SpO2 99%   BMI 35.52 kg/m   General:   Alert and oriented. No distress noted. Pleasant and cooperative.  Head:  Normocephalic and atraumatic. Eyes:  Conjuctiva clear without scleral icterus. Mouth:  Oral mucosa pink and moist. Mild erythema to posterior tongue and pharynx. No signs of yeast. No obvious ulcer.  Lungs:  Clear to auscultation bilaterally. No wheezes, rales, or rhonchi. No distress.  Heart:  S1, S2 present without murmurs appreciated.  Abdomen:  +BS, soft, non-tender and non-distended. No rebound or guarding. No HSM or masses noted. Rectal: deferred Msk:  Symmetrical without gross deformities. Normal posture. Extremities:  Without edema. Neurologic:  Alert and  oriented x4 Psych:  Alert and cooperative. Normal mood and affect.   Assessment  [name] is a 76 y.o. female with a history of opioid-induced chronic constipation, adenomatous colon polyps, GERD, esophageal dysphagia with Schatzki's ring, oropharyngeal dysphagia, esophageal granular cell tumor s/p EMR 2011 at [facility] presenting today for follow-up on GERD and dysphagia.  GERD: Previously well controlled on Aciphex 20 mg twice daily.  EGD in March during hospitalization with grade a esophagitis, mild Schatzki's ring s/p dilation.  She was discharged on pantoprazole 40 mg twice daily but there was initial confusion regarding her medication.  She was restarted on pantoprazole 40 mg daily at hospital follow-up visit and was also taking Pepcid 20 mg daily at bedtime.  She has since stopped taking Pepcid 20 mg daily and is fairly well controlled on her twice daily PPI.  Although she was better managed with Aciphex in the past, she continues to not be interested in changing regimen at this time.  We discussed continuing to avoid her food triggers.  Dysphagia: History of esophageal and oropharyngeal dysphagia.  Extensive work-up in the past with multiple EGDs and modified barium swallow studies.  Most  recent EGD in March 2023 with Schatzki's ring s/p dilation.  Last modified barium swallow in April 2023 with prolonged transit of solids and liquids with residuals after liquids which clears with repeat swallows.  She was advised she may have any textures as long as she allows extra time and increased effort with swallowing.  Lately has been having issues with swallowing softer foods such as green potatoes and applesauce as well as bananas and eggs.  Denies any significant issues with liquids.  Reports this has been since she has noticed some tongue swelling that has worsened over the last couple of months.  Reports she was having some tongue swelling in April while on low-dose losartan but was not sure what was causing it.  Overall symptoms appear to be around baseline.  We discussed that if dysphagia worsens or continues after any improvement in tongue swelling that we could reevaluate with another modified barium swallow to see speech therapy.  Tongue swelling, mouth pain: Has been experiencing swelling of her tongue for the last few months that is affecting her swallowing of some foods.  Reports her only new medications as of recently are levothyroxine, Astelin nasal spray, and losartan.  She reports he has been on losartan for quite a few months but did recently have her  dose increased from 25 mg to 100 mg.  She had difficulty remembering when her swelling started initially noticed a significant increase after her increase in losartan.  She had mild irritation to area of uvula and posterior tongue on exam, difficult to see oropharynx, able to see small portion on exam today. No signs of yeast.  No signs of any airway edema.  Given that it is unclear on timing of her tongue swelling in regards to her increased ARB, I recommended for her to reach out to her PCP regarding stopping this medication as precautionary measure.  Patient reports no new food intake or any other lifestyle changes that could contribute.   Has also been experiencing some pain in her mouth likely related to glossitis.  I will give her a small supply of Magic mouthwash to swish and spit to see if this will help alleviate any of her pain.    PLAN   Continue pantoprazole 40 mg daily. GERD diet/lifestyle modifications Magic mouthwash Advised to stop losartan. Notify PCP of tongue swelling and consider alternate antihypertensive. Possible follow-up with speech therapy for MBSS if worsening dysphagia continues after treatment of glossitis. Follow up in 2 months.     Venetia Night, MSN, FNP-BC, AGACNP-BC [facility]

## 2022-04-18 ENCOUNTER — Ambulatory Visit (INDEPENDENT_AMBULATORY_CARE_PROVIDER_SITE_OTHER): Payer: Medicare Other | Admitting: Gastroenterology

## 2022-04-18 ENCOUNTER — Encounter: Payer: Self-pay | Admitting: Gastroenterology

## 2022-04-18 ENCOUNTER — Telehealth: Payer: Self-pay | Admitting: Cardiology

## 2022-04-18 VITALS — BP 133/89 | HR 73 | Temp 98.3°F | Ht 62.0 in | Wt 194.2 lb

## 2022-04-18 DIAGNOSIS — R220 Localized swelling, mass and lump, head: Secondary | ICD-10-CM

## 2022-04-18 DIAGNOSIS — R1312 Dysphagia, oropharyngeal phase: Secondary | ICD-10-CM

## 2022-04-18 DIAGNOSIS — K219 Gastro-esophageal reflux disease without esophagitis: Secondary | ICD-10-CM

## 2022-04-18 DIAGNOSIS — K1379 Other lesions of oral mucosa: Secondary | ICD-10-CM | POA: Diagnosis not present

## 2022-04-18 MED ORDER — MAGIC MOUTHWASH W/LIDOCAINE: 5.0000 mL | Freq: Two times a day (BID) | ORAL | 0 refills | Status: DC | PRN

## 2022-04-18 MED ORDER — HYDRALAZINE HCL 25 MG PO TABS: 25.0000 mg | ORAL_TABLET | Freq: Two times a day (BID) | ORAL | 3 refills | Status: DC

## 2022-04-18 NOTE — Telephone Encounter (Signed)
I spoke with patient.She will stop losartan and switch to hydralazine 25 mg bid.She will record BP's at home and call us back in a week with readings.

## 2022-04-18 NOTE — Telephone Encounter (Signed)
I will message PharmD and FYI Dr.Branch

## 2022-04-18 NOTE — Telephone Encounter (Signed)
Not a common side effect but is a possible side effect. Can stop losartan. Has had side effects to some other meds in the past including beta atenolol and issues on a diuretic in the past, would start hydralazine '25mg'$  bid. Update up 1 week on bp's or if does not have home cuff then nursing visit for bp check  Zandra Abts MD

## 2022-04-18 NOTE — Telephone Encounter (Signed)
Pt c/o medication issue:  1. Name of Medication: losartan (COZAAR) 100 MG tablet  2. How are you currently taking this medication (dosage and times per day)? As prescribed for 3 months   3. Are you having a reaction (difficulty breathing--STAT)? Yes  4. What is your medication issue?  Pt states that while at [facility] doctors appt today, they told her that this medication may be the cause of her tongue swelling and would like a call back to discuss this.

## 2022-04-18 NOTE — Patient Instructions (Addendum)
I would recommend you contact your primary care provider at Dr. Juel Burrow office regarding your tongue swelling.  It is a possibility that it could be related to your increase in your losartan.  I would recommend that right now you stop taking the medication and discuss with them a different medication to manage your BP.  Continue eating your small bites and alternating with sips of liquids and allowing yourself plenty of time between swallows.  If regular textures seem to be easier, please continue these.  Continue pantoprazole 40 mg twice daily.  For some of your mouth irritation, I will send in a small supply of magic mouthwash for you to use twice daily as needed for pain in your mouth.  Is important that you swish and spit this medication and do not swallow.  Follow up in 2 months.   It was a pleasure to see you today. I want to create trusting relationships with patients. If you receive a survey regarding your visit,  I greatly appreciate you taking time to fill this out on paper or through your MyChart. I value your feedback.  Venetia Night, MSN, FNP-BC, AGACNP-BC [facility]

## 2022-05-07 ENCOUNTER — Other Ambulatory Visit: Payer: Self-pay | Admitting: Neurology

## 2022-05-07 ENCOUNTER — Encounter: Payer: Self-pay | Admitting: Neurology

## 2022-05-08 ENCOUNTER — Telehealth: Payer: Self-pay

## 2022-05-08 ENCOUNTER — Telehealth: Payer: Self-pay | Admitting: Psychiatry

## 2022-05-08 ENCOUNTER — Ambulatory Visit (INDEPENDENT_AMBULATORY_CARE_PROVIDER_SITE_OTHER): Payer: Medicare Other | Admitting: Psychiatry

## 2022-05-08 VITALS — BP 173/90 | HR 87 | Ht 62.0 in | Wt 197.0 lb

## 2022-05-08 DIAGNOSIS — G43109 Migraine with aura, not intractable, without status migrainosus: Secondary | ICD-10-CM

## 2022-05-08 MED ORDER — QULIPTA 30 MG PO TABS
30.0000 mg | ORAL_TABLET | Freq: Every day | ORAL | 6 refills | Status: DC
Start: 2022-05-08 — End: 2022-12-23

## 2022-05-08 MED ORDER — UBRELVY 100 MG PO TABS: 100.0000 mg | ORAL_TABLET | ORAL | 6 refills | Status: DC | PRN

## 2022-05-08 NOTE — Telephone Encounter (Signed)
Contacted pt X3, cell number is invalid it is [employer] #. Called home, no answer.  Pt needs to get something to help her sleep from PCP.

## 2022-05-08 NOTE — Telephone Encounter (Signed)
Pt stated at check out that she forgot to mention during her visit that she would like a prescription for something to help her sleep

## 2022-05-08 NOTE — Telephone Encounter (Signed)
PA for Ubrelvy 100 MG Tablets has been sent to plan via CMM. Awaiting determination from [facility].  Key:  [id number]

## 2022-05-08 NOTE — Progress Notes (Signed)
Referring:  Celene Squibb, MD [place],  [place] [id number]  PCP: Celene Squibb, MD  Neurology was asked to evaluate [name], a 76 year old female for a chief complaint of headaches.  Our recommendations of care will be communicated by shared medical record.    CC:  headaches  History provided from self  HPI:  Medical co-morbidities: HTN, CHF, OSA, hyperparathyroidism, prediabetes, asthma, CKD 3, HLD, depression, anxiety, dementia  The patient presents for evaluation of migraines which have been present for several years. They have been worsening in frequency since March. Migraines are described as frontal or left retro-orbital pain with associated photophobia, phonophobia, and nausea. She does occasionally have an aura of flashing lights in her vision. Headaches can last for several hours at a time.  She follows with Neurology for dementia and migraines (last seen 03/05/22) and was has tried multiple preventive medications including Botox. She was most recently on Aimovig for 2 years, but stopped it as she did not think it was effective. She is currently taking Gabriel Earing Powder nearly every day for her headaches.  Headache History: Aura: lights in vision Location: front, left retro-orbital, neck Associated Symptoms:  Photophobia: yes  Phonophobia: yes  Nausea: yes Worse with activity?: yes Duration of headaches: several hours  Headache days per month: 14 Headache free days per month: 16  Current Treatment: Abortive Goody Powder  Preventative none  Prior Therapies                                 Prevention Aimovig 70 mg monthly Lisinopril - cough Losartan 100 mg daily Atenolol - fatigue Topamax 100 mg daily Gabapentin 300 mg QHS Effexor 225 mg daily Cymbalta - rash Botox  Rescue Goody Powder Seroquel 25 mg PRN   LABS: CBC    Component Value Date/Time   WBC 4.6 09/10/2021 1425   RBC 5.18 (H) 09/10/2021 1425   HGB 14.4 09/10/2021 1425    HCT 44.1 09/10/2021 1425   PLT 196 09/10/2021 1425   MCV 85.1 09/10/2021 1425   MCH 27.8 09/10/2021 1425   MCHC 32.7 09/10/2021 1425   RDW 14.9 09/10/2021 1425   LYMPHSABS 1,398 09/10/2021 1425   MONOABS 0.8 08/20/2021 1849   EOSABS 18 09/10/2021 1425   BASOSABS 18 09/10/2021 1425      Latest Ref Rng & Units 03/18/2022   10:17 AM 02/12/2022   12:16 PM 09/26/2021    3:24 PM  CMP  Glucose 70 - 99 mg/dL 102  104  118   BUN 8 - 27 mg/dL '18  24  8   '$ Creatinine 0.57 - 1.00 mg/dL 1.10  0.93  0.84   Sodium 134 - 144 mmol/L 138  136  141   Potassium 3.5 - 5.2 mmol/L 4.6  3.2  4.2   Chloride 96 - 106 mmol/L 101  102  114   CO2 20 - 29 mmol/L '18  24  21   '$ Calcium 8.7 - 10.3 mg/dL 10.3  9.4  9.4      IMAGING:  MRI brain 12/2020:  1. Mild chronic white matter disease, most likely related to chronic microangiopathy. 2. Moderate parenchymal volume loss, more pronounced in the bilateral parietal regions and right perisylvian.  Imaging independently reviewed on May 08, 2022   Current Outpatient Medications on File Prior to Visit  Medication Sig Dispense Refill   albuterol (PROVENTIL HFA;VENTOLIN HFA)  108 (90 BASE) MCG/ACT inhaler Inhale 2 puffs into the lungs every 6 (six) hours as needed for wheezing or shortness of breath.      amLODipine (NORVASC) 10 MG tablet Take 10 mg by mouth daily.     azelastine (ASTELIN) 0.1 % nasal spray in the morning and at bedtime.     Azelastine-Fluticasone 137-50 MCG/ACT SUSP Spray 1 spray twice a day by intranasal route.     Calcium Carbonate-Vit D-Min (CALCIUM 1200 PO) Take by mouth daily.     clobetasol ointment (TEMOVATE) 0.05 % APPLY A THIN LAYER TO THE AFFECTED AREA(S) BY TOPICAL ROUTE 2 TIMES PER DAY     furosemide (LASIX) 40 MG tablet Take 1 tablet (40 mg total) by mouth daily. 90 tablet 3   hydrALAZINE (APRESOLINE) 25 MG tablet Take 1 tablet (25 mg total) by mouth in the morning and at bedtime. 180 tablet 3   hydrOXYzine (ATARAX) 25 MG tablet  Take one tablet by mouth once daily as needed for anxiety     levocetirizine (XYZAL) 5 MG tablet Take 5 mg by mouth every evening.     levothyroxine (SYNTHROID) 25 MCG tablet Take 25 mcg by mouth daily.     losartan (COZAAR) 100 MG tablet Take 100 mg by mouth daily.     lovastatin (MEVACOR) 40 MG tablet Take 40 mg by mouth at bedtime.     magic mouthwash w/lidocaine SOLN Take 5 mLs by mouth 2 (two) times daily as needed for mouth pain. Swish in the oral cavity for 1-2 minutes and then spit out the liquid. Do not swallow. 400 mL 0   Multiple Vitamins-Minerals (CENTRUM SILVER 50+WOMEN PO) Take by mouth daily.     pantoprazole (PROTONIX) 40 MG tablet TAKE ONE TABLET ('40MG'$  TOTAL) BY MOUTH TWO TIMES DAILY 60 tablet 5   potassium chloride SA (KLOR-CON M) 20 MEQ tablet Take 1 tablet (20 mEq total) by mouth daily.     QUEtiapine (SEROQUEL) 25 MG tablet Take 25 mg by mouth daily.     spironolactone (ALDACTONE) 25 MG tablet Take 1 tablet (25 mg total) by mouth daily. 90 tablet 3   acetaminophen (TYLENOL) 500 MG tablet Take 2 tablets (1,000 mg total) by mouth every 8 (eight) hours as needed for mild pain, moderate pain or fever. (Patient not taking: Reported on 04/18/2022)     AIMOVIG 70 MG/ML SOAJ Inject 70 mg into the skin every 30 (thirty) days. (Patient not taking: Reported on 05/08/2022)     No current facility-administered medications on file prior to visit.     Allergies: Allergies  Allergen Reactions   Elavil [Amitriptyline] Other (See Comments)    Felt really nervous and felt like something was hold her feet or arm and/ or AM headache on it and back on it and went away off it.    Abilify [Aripiprazole] Other (See Comments)    Dystonic reaction   Codeine Hives, Nausea Only and Other (See Comments)    Feels funny   Latex Hives   Penicillins Hives, Itching and Other (See Comments)    Has patient had a PCN reaction causing immediate rash, facial/tongue/throat swelling, SOB or lightheadedness  with hypotension: Yes Has patient had a PCN reaction causing severe rash involving mucus membranes or skin necrosis: No Has patient had a PCN reaction that required hospitalization No Has patient had a PCN reaction occurring within the last 10 years: No If all of the above answers are "NO", then may proceed with Cephalosporin use.  Polyethylene Glycol Other (See Comments)    Per allergy test   Sulfonamide Derivatives Nausea And Vomiting   Cymbalta [Duloxetine Hcl] Rash   Remeron [Mirtazapine] Other (See Comments)    Caused lots of strange, weird, crazy dreams.    Family History: Migraine or other headaches in the family: mother and father had migraines Aneurysms in a first degree relative:  none Brain tumors in the family:  sister has brain cancer Other neurological illness in the family:   none  Past Medical History: Past Medical History:  Diagnosis Date   Adenomatous colon polyp 2006   excised in 2006 & 2010Due surveillance 06/2013   Anxiety    Anxiety and depression    Asthma    Breast mass    right nipple bengn mass per patient   Breast tumor    Chest discomfort    Chronic back pain    Chronic constipation    Complication of anesthesia    Depression    Diverticulosis    Fibromyalgia    Gastroesophageal reflux disease    Hiatal hernia    History of benign esophageal tumor 2010   granular cell esophageal tumor (Dx 06/2008), resected via EMR 2011, due repeat EGD 02/2012   Hyperlipidemia    Hypertension    Insomnia    Migraines    PONV (postoperative nausea and vomiting)    Psychosis ([place])    Sleep apnea    Stop Bang score of 5. Pt said she was told by Dr. Merlene Laughter that she had "a little bit" of sleep apena, but not bad enough to treat.   Thyroid nodule    Tumor of esophagus     Past Surgical History Past Surgical History:  Procedure Laterality Date   ABDOMINAL HYSTERECTOMY     BALLOON DILATION  08/23/2021   Procedure: BALLOON DILATION;  Surgeon: Eloise Harman, DO;  [facility];  Service: Endoscopy;;   BIOPSY  10/14/2019   Procedure: BIOPSY;  Surgeon: Daneil Dolin, MD;  [facility];  Service: Endoscopy;;   BRAVO [place] STUDY  12/11/2011   Procedure: BRAVO [place];  Surgeon: Daneil Dolin, MD;  [facility];  Service: Endoscopy;  Laterality: N/A;   BREAST EXCISIONAL BIOPSY  1990s, 2012   Left x2-sclerosing ductal papilloma-2012   CHOLECYSTECTOMY N/A 04/09/2013   Procedure: LAPAROSCOPIC CHOLECYSTECTOMY;  Surgeon: Jamesetta So, MD;  Location: [facility];  Service: General;  Laterality: N/A;   COLONOSCOPY  06/2008, 06/2011   sigmoid tics, tubular adenoma; 2013: anal canal hemorrhoids   COLONOSCOPY  07/10/2011   Anal canal hemorrhoids likely the cause of hematochezia in the setting of constipation; otherwise normal rectum ;submucosal  petechiae in left colon of doubtful clinical significance; otherwise, normal colon   COLONOSCOPY WITH PROPOFOL N/A 11/10/2017   cancelled in pre-op   COLONOSCOPY WITH PROPOFOL N/A 07/23/2018   Procedure: COLONOSCOPY WITH PROPOFOL;  Surgeon: Daneil Dolin, MD; 1 tubular adenoma, diverticulosis in the sigmoid and descending colon, nonbleeding internal hemorrhoids.  No recommendations to repeat due to age.   ESOPHAGEAL BRUSHING  08/23/2021   Procedure: ESOPHAGEAL BRUSHING;  Surgeon: Eloise Harman, DO;  [facility];  Service: Endoscopy;;   ESOPHAGEAL DILATION N/A 05/08/2015   Procedure: ESOPHAGEAL DILATION;  Surgeon: Daneil Dolin, MD;  Location: [facility];  Service: Endoscopy;  Laterality: N/AVenia Minks 54/56   ESOPHAGOGASTRODUODENOSCOPY  12/11/2011   QPR:[id number] Schatzki's ring; otherwise normal/Small hiatal hernia. Antral and bulbar  erosions   ESOPHAGOGASTRODUODENOSCOPY (EGD) WITH PROPOFOL N/A 05/08/2015   Dr. Gala Romney: mild erosive reflux esophagitis, non-critical Schatzki's ring s/p dilation. Hiatal hernia.    ESOPHAGOGASTRODUODENOSCOPY (EGD) WITH PROPOFOL N/A  10/14/2019   Procedure: ESOPHAGOGASTRODUODENOSCOPY (EGD) WITH PROPOFOL;  Surgeon: Daneil Dolin, MD;  Nonobstructing Schatzki ring, small hiatal hernia, abnormal gastric mucosa s/p biopsied, normal first and second portion of the duodenum.  Pathology with chronic inactive gastritis, no H. pylori.   ESOPHAGOGASTRODUODENOSCOPY (EGD) WITH PROPOFOL N/A 08/23/2021   Surgeon: Eloise Harman, DO;   white speckled mucosa in the esophagus s/p cells obtained for cytology (KOH prep negative), medium-sized hiatal hernia, grade A reflux esophagitis, mild Schatzki's ring s/p dilation, normal examined stomach and duodenum.   EUS  08/2010   Dr [facility] with EGD. Retained food. No recurrent esophageal lesion, bx negative.   IR KYPHO THORACIC WITH BONE BIOPSY  10/17/2020   IR RADIOLOGIST EVAL & MGMT  09/12/2020   LUNG BIOPSY     negative   PARATHYROIDECTOMY  09/2015   [facility].    PARATHYROIDECTOMY     POLYPECTOMY  07/23/2018   Procedure: POLYPECTOMY;  Surgeon: Daneil Dolin, MD;  [facility];  Service: Endoscopy;;  colon   RIGHT OOPHORECTOMY     benign disease   SHOULDER ARTHROSCOPY     Right; bone spurs removed   TONSILLECTOMY     TOTAL KNEE ARTHROPLASTY  2002   Right; previous arthroscopic surgery    Social History: Social History   Tobacco Use   Smoking status: Never   Smokeless tobacco: Never  Vaping Use   Vaping Use: Never used  Substance Use Topics   Alcohol use: No    Alcohol/week: 0.0 standard drinks of alcohol   Drug use: No    ROS: Negative for fevers, chills. Positive for headaches. All other systems reviewed and negative unless stated otherwise in HPI.   Physical Exam:   Vital Signs: BP (!) 173/90   Pulse 87   Ht '5\' 2"'$  (1.575 m)   Wt 197 lb (89.4 kg)   BMI 36.03 kg/m  GENERAL: well appearing,in no acute distress,alert SKIN:  Color, texture, turgor normal. No rashes or lesions HEAD:  Normocephalic/atraumatic. CV:  RRR RESP: Normal respiratory  effort MSK: +tenderness to palpation over bilateral occiput, neck, and shoulders  NEUROLOGICAL: Mental Status: Alert, oriented to person, place and time,Follows commands Cranial Nerves: PERRL, visual fields intact to confrontation, extraocular movements intact, facial sensation intact, no facial droop or ptosis, hearing grossly intact, no dysarthria Motor: muscle strength 5/5 both upper and lower extremities,no drift, normal tone Reflexes: 2+ throughout Sensation: intact to light touch all 4 extremities Coordination: Finger-to- nose-finger intact bilaterally Gait: normal-based   IMPRESSION: 76 year old female with a history of HTN, CHF, OSA, hyperparathyroidism, prediabetes, asthma, CKD 3, HLD, depression, anxiety, dementia who presents for evaluation of migraines. She has failed multiple preventive medications including Aimovig and Botox. She would prefer to avoid further injections if possible. Will start Qulipta for migraine prevention. Will start at 30 mg daily as it can exacerbate constipation, but may increase to 60 mg daily if she tolerates it well. Would avoid triptans for rescue given history of poorly controlled HTN. Will start Ubrelvy for migraine rescue. She does request opiates today. Counseled that opiates are not indicated for migraine headache and may worsen headaches due to high risk of rebound headache. Regular use of opiates may also exacerbate dementia.  PLAN: -Prevention: Start Qulipta 30 mg daily, may uptitrate  to 60 mg daily if well-tolerated -Rescue: Start Ubrelvy 100 mg PRN -Next steps: consider Ajovy/Emgality, Vyepti, Depakote   I spent a total of 33 minutes chart reviewing and counseling the patient. Headache education was done. Discussed treatment options including preventive and acute medications. Discussed medication overuse headache and to limit use of acute treatments to no more than 2 days/week or 10 days/month. Discussed medication side effects, adverse  reactions and drug interactions. Written educational materials and patient instructions outlining all of the above were given.  Follow-up: 6 months   Genia Harold, MD 05/08/2022   1:18 PM

## 2022-05-08 NOTE — Telephone Encounter (Signed)
PA for [name] has been APPROVED through 06/24/2023.  Pharmacy notified via fax.

## 2022-05-08 NOTE — Telephone Encounter (Signed)
PA for Qulipta 30 MG Tablets has been sent to plan via CMM. Awaiting determination from [facility].  Key: Dominic Pea

## 2022-05-09 NOTE — Telephone Encounter (Signed)
PA for Ubrelvy 100 MG was partially approved for 16 tablets per 30 days.  Pharmacy notified via fax.

## 2022-05-14 ENCOUNTER — Ambulatory Visit: Payer: Medicare Other | Attending: Student | Admitting: Student

## 2022-05-14 ENCOUNTER — Encounter: Payer: Self-pay | Admitting: Student

## 2022-05-14 VITALS — BP 148/82 | HR 86 | Ht 62.0 in | Wt 192.0 lb

## 2022-05-14 DIAGNOSIS — I5032 Chronic diastolic (congestive) heart failure: Secondary | ICD-10-CM

## 2022-05-14 DIAGNOSIS — I10 Essential (primary) hypertension: Secondary | ICD-10-CM

## 2022-05-14 DIAGNOSIS — R002 Palpitations: Secondary | ICD-10-CM | POA: Diagnosis not present

## 2022-05-14 DIAGNOSIS — E782 Mixed hyperlipidemia: Secondary | ICD-10-CM

## 2022-05-14 MED ORDER — HYDRALAZINE HCL 50 MG PO TABS: 50.0000 mg | ORAL_TABLET | Freq: Two times a day (BID) | ORAL | 3 refills | Status: DC

## 2022-05-14 NOTE — Patient Instructions (Signed)
Medication Instructions:  Your physician has recommended you make the following change in your medication:   Increase Hydralazine to 50 mg Two Times Daily   *If you need a refill on your cardiac medications before your next appointment, please call your pharmacy*   Lab Work: NONE   If you have labs (blood work) drawn today and your tests are completely normal, you will receive your results only by: [place] (if you have MyChart) OR A paper copy in the mail If you have any lab test that is abnormal or we need to change your treatment, we will call you to review the results.   Testing/Procedures: NONE    Follow-Up: At [facility], you and your health needs are our priority.  As part of our continuing mission to provide you with exceptional heart care, we have created designated Provider Care Teams.  These Care Teams include your primary Cardiologist (physician) and Advanced Practice Providers (APPs -  Physician Assistants and Nurse Practitioners) who all work together to provide you with the care you need, when you need it.  We recommend signing up for the patient portal called "MyChart".  Sign up information is provided on this After Visit Summary.  MyChart is used to connect with patients for Virtual Visits (Telemedicine).  Patients are able to view lab/test results, encounter notes, upcoming appointments, etc.  Non-urgent messages can be sent to your provider as well.   To learn more about what you can do with MyChart, go to [email or web address].    Your next appointment:   3-4 month(s)  The format for your next appointment:   In Person  Provider:   You may see Carlyle Dolly, MD or one of the following Advanced Practice Providers on your designated Care Team:   Bernerd Pho, PA-C  Ermalinda Barrios, [place]     Other Instructions Thank you for choosing [facility]!    Important Information About Sugar

## 2022-05-14 NOTE — Progress Notes (Signed)
Cardiology Office Note    Date:  05/14/2022   ID:  [name], DOB [birth year], MRN [id number]  PCP:  Celene Squibb, MD  Cardiologist: Carlyle Dolly, MD    Chief Complaint  Patient presents with   Follow-up    3 month visit    History of Present Illness:    [name] is a 76 y.o. female with past medical history of HFpEF (edema possibly due to lymphedema), palpitations, HTN and HLD who presents to the office today for 76-monthfollow-up.  She was last examined by Dr. BHarl Bowiein 01/2022 and was not taking Atenolol at that time due to thinking this was contributing to fatigue and she had been on Losartan with this recently being titrated to 100 mg daily by her PCP. She had stopped Lasix as well 3 months prior to her visit due to recurrent issues with hypokalemia. It was recommended to restart Lasix at a lower dose of 40 mg daily along with starting Aldactone 25 mg daily. Follow-up labs did show hypokalemia and she was started on K-dur to assist with this.   She called the office in 03/2022 reporting issues with her tongue swelling and had been told this could be due to Losartan. While this was felt to be not a common side effect of the medication, this was discontinued and she was started on Hydralazine 25 mg twice daily.  In talking with the patient and her daughter today, she reports overall doing well since her last office visit. Reports her lower extremity edema improved with restarting Lasix. Spironolactone was discontinued by her PCP in the interim due to hyperkalemia (unclear if he knew she was on K-dur). She reports she has been taking Hydralazine 25 mg twice daily and has overall tolerated well. BP is still been elevated when checked at home. Initially at 150/88 during today's visit and at 148/82 on recheck. She denies any specific orthopnea, PND or pitting edema. No recent chest pain or palpitations.   Past Medical History:  Diagnosis Date   Adenomatous colon polyp  2006   excised in 2006 & 2010Due surveillance 06/2013   Anxiety    Anxiety and depression    Asthma    Breast mass    right nipple bengn mass per patient   Breast tumor    Chest discomfort    Chronic back pain    Chronic constipation    Complication of anesthesia    Depression    Diverticulosis    Fibromyalgia    Gastroesophageal reflux disease    Hiatal hernia    History of benign esophageal tumor 2010   granular cell esophageal tumor (Dx 06/2008), resected via EMR 2011, due repeat EGD 02/2012   Hyperlipidemia    Hypertension    Insomnia    Migraines    PONV (postoperative nausea and vomiting)    Psychosis ([place]    Sleep apnea    Stop Bang score of 5. Pt said she was told by Dr. DMerlene Laughterthat she had "a little bit" of sleep apena, but not bad enough to treat.   Thyroid nodule    Tumor of esophagus     Past Surgical History:  Procedure Laterality Date   ABDOMINAL HYSTERECTOMY     BALLOON DILATION  08/23/2021   Procedure: BALLOON DILATION;  Surgeon: CEloise Harman DO;  [facility];  Service: Endoscopy;;   BIOPSY  10/14/2019   Procedure: BIOPSY;  Surgeon: RDaneil Dolin MD;  [facility];  Service: Endoscopy;;   BRAVO [place] STUDY  12/11/2011   Procedure: BRAVO [place];  Surgeon: Daneil Dolin, MD;  [facility];  Service: Endoscopy;  Laterality: N/A;   BREAST EXCISIONAL BIOPSY  1990s, 2012   Left x2-sclerosing ductal papilloma-2012   CHOLECYSTECTOMY N/A 04/09/2013   Procedure: LAPAROSCOPIC CHOLECYSTECTOMY;  Surgeon: Jamesetta So, MD;  Location: [facility];  Service: General;  Laterality: N/A;   COLONOSCOPY  06/2008, 06/2011   sigmoid tics, tubular adenoma; 2013: anal canal hemorrhoids   COLONOSCOPY  07/10/2011   Anal canal hemorrhoids likely the cause of hematochezia in the setting of constipation; otherwise normal rectum ;submucosal  petechiae in left colon of doubtful clinical significance; otherwise, normal colon   COLONOSCOPY WITH  PROPOFOL N/A 11/10/2017   cancelled in pre-op   COLONOSCOPY WITH PROPOFOL N/A 07/23/2018   Procedure: COLONOSCOPY WITH PROPOFOL;  Surgeon: Daneil Dolin, MD; 1 tubular adenoma, diverticulosis in the sigmoid and descending colon, nonbleeding internal hemorrhoids.  No recommendations to repeat due to age.   ESOPHAGEAL BRUSHING  08/23/2021   Procedure: ESOPHAGEAL BRUSHING;  Surgeon: Eloise Harman, DO;  [facility];  Service: Endoscopy;;   ESOPHAGEAL DILATION N/A 05/08/2015   Procedure: ESOPHAGEAL DILATION;  Surgeon: Daneil Dolin, MD;  Location: [facility];  Service: Endoscopy;  Laterality: N/AVenia Minks 54/56   ESOPHAGOGASTRODUODENOSCOPY  12/11/2011   ZDG:LOVFIEPPIRJ Schatzki's ring; otherwise normal/Small hiatal hernia. Antral and bulbar erosions   ESOPHAGOGASTRODUODENOSCOPY (EGD) WITH PROPOFOL N/A 05/08/2015   Dr. Gala Romney: mild erosive reflux esophagitis, non-critical Schatzki's ring s/p dilation. Hiatal hernia.    ESOPHAGOGASTRODUODENOSCOPY (EGD) WITH PROPOFOL N/A 10/14/2019   Procedure: ESOPHAGOGASTRODUODENOSCOPY (EGD) WITH PROPOFOL;  Surgeon: Daneil Dolin, MD;  Nonobstructing Schatzki ring, small hiatal hernia, abnormal gastric mucosa s/p biopsied, normal first and second portion of the duodenum.  Pathology with chronic inactive gastritis, no H. pylori.   ESOPHAGOGASTRODUODENOSCOPY (EGD) WITH PROPOFOL N/A 08/23/2021   Surgeon: Eloise Harman, DO;   white speckled mucosa in the esophagus s/p cells obtained for cytology (KOH prep negative), medium-sized hiatal hernia, grade A reflux esophagitis, mild Schatzki's ring s/p dilation, normal examined stomach and duodenum.   EUS  08/2010   Dr [facility] with EGD. Retained food. No recurrent esophageal lesion, bx negative.   IR KYPHO THORACIC WITH BONE BIOPSY  10/17/2020   IR RADIOLOGIST EVAL & MGMT  09/12/2020   LUNG BIOPSY     negative   PARATHYROIDECTOMY  09/2015   [facility].    PARATHYROIDECTOMY     POLYPECTOMY  07/23/2018    Procedure: POLYPECTOMY;  Surgeon: Daneil Dolin, MD;  [facility];  Service: Endoscopy;;  colon   RIGHT OOPHORECTOMY     benign disease   SHOULDER ARTHROSCOPY     Right; bone spurs removed   TONSILLECTOMY     TOTAL KNEE ARTHROPLASTY  2002   Right; previous arthroscopic surgery    Current Medications: Outpatient Medications Prior to Visit  Medication Sig Dispense Refill   albuterol (PROVENTIL HFA;VENTOLIN HFA) 108 (90 BASE) MCG/ACT inhaler Inhale 2 puffs into the lungs every 6 (six) hours as needed for wheezing or shortness of breath.      amLODipine (NORVASC) 10 MG tablet Take 10 mg by mouth daily.     Atogepant (QULIPTA) 30 MG TABS Take 30 mg by mouth daily. 30 tablet 6   azelastine (ASTELIN) 0.1 % nasal spray in the morning and at bedtime.     Azelastine-Fluticasone  137-50 MCG/ACT SUSP Spray 1 spray twice a day by intranasal route.     Calcium Carbonate-Vit D-Min (CALCIUM 1200 PO) Take by mouth daily.     clobetasol ointment (TEMOVATE) 0.05 % APPLY A THIN LAYER TO THE AFFECTED AREA(S) BY TOPICAL ROUTE 2 TIMES PER DAY     famotidine (PEPCID) 20 MG tablet TAKE TWO TABLETS ('40MG'$  TOTAL) BY MOUTH AT BEDTIME     furosemide (LASIX) 40 MG tablet Take 1 tablet (40 mg total) by mouth daily. 90 tablet 3   hydrOXYzine (ATARAX) 25 MG tablet Take one tablet by mouth once daily as needed for anxiety     levothyroxine (SYNTHROID) 25 MCG tablet Take 25 mcg by mouth daily.     lovastatin (MEVACOR) 40 MG tablet Take 40 mg by mouth at bedtime.     magic mouthwash w/lidocaine SOLN Take 5 mLs by mouth 2 (two) times daily as needed for mouth pain. Swish in the oral cavity for 1-2 minutes and then spit out the liquid. Do not swallow. 400 mL 0   Multiple Vitamins-Minerals (CENTRUM SILVER 50+WOMEN PO) Take by mouth daily.     pantoprazole (PROTONIX) 40 MG tablet TAKE ONE TABLET ('40MG'$  TOTAL) BY MOUTH TWO TIMES DAILY 60 tablet 5   potassium chloride SA (KLOR-CON M) 20 MEQ tablet Take 1 tablet (20 mEq  total) by mouth daily.     Ubrogepant (UBRELVY) 100 MG TABS Take 100 mg by mouth as needed (for migraines). May repeat a dose in 2 hours if headache persists. Max dose 2 pills in 24 hours 16 tablet 6   acetaminophen (TYLENOL) 500 MG tablet Take 2 tablets (1,000 mg total) by mouth every 8 (eight) hours as needed for mild pain, moderate pain or fever. (Patient not taking: Reported on 04/18/2022)     AIMOVIG 70 MG/ML SOAJ Inject 70 mg into the skin every 30 (thirty) days. (Patient not taking: Reported on 05/08/2022)     levocetirizine (XYZAL) 5 MG tablet Take 5 mg by mouth every evening. (Patient not taking: Reported on 05/14/2022)     QUEtiapine (SEROQUEL) 25 MG tablet Take 25 mg by mouth daily. (Patient not taking: Reported on 05/14/2022)     hydrALAZINE (APRESOLINE) 25 MG tablet Take 1 tablet (25 mg total) by mouth in the morning and at bedtime. (Patient not taking: Reported on 05/14/2022) 180 tablet 3   losartan (COZAAR) 100 MG tablet Take 100 mg by mouth daily. (Patient not taking: Reported on 05/14/2022)     spironolactone (ALDACTONE) 25 MG tablet Take 1 tablet (25 mg total) by mouth daily. (Patient not taking: Reported on 05/14/2022) 90 tablet 3   No facility-administered medications prior to visit.     Allergies:   Elavil [amitriptyline], Abilify [aripiprazole], Codeine, Latex, Penicillins, Polyethylene glycol, Spironolactone, Sulfonamide derivatives, Cymbalta [duloxetine hcl], and Remeron [mirtazapine]   Social History   Socioeconomic History   Marital status: Single    Spouse name: Not on file   Number of children: 1   Years of education: Not on file   Highest education level: Not on file  Occupational History   Occupation: disabled    Employer: RETIRED  Tobacco Use   Smoking status: Never   Smokeless tobacco: Never  Vaping Use   Vaping Use: Never used  Substance and Sexual Activity   Alcohol use: No    Alcohol/week: 0.0 standard drinks of alcohol   Drug use: No   Sexual  activity: Never  Other Topics Concern   Not on file  Social History Narrative   Not on file   Social Determinants of Health   Financial Resource Strain: Not on file  Food Insecurity: Not on file  Transportation Needs: Not on file  Physical Activity: Not on file  Stress: Not on file  Social Connections: Not on file     Family History:  The patient's family history includes Alcohol abuse in her father; Anxiety disorder in her mother; Colon cancer in her paternal aunt and paternal uncle; Dementia in her maternal uncle; Depression in her mother; Heart attack in her mother; Stroke in her father.   Review of Systems:    Please see the history of present illness.     All other systems reviewed and are otherwise negative except as noted above.   Physical Exam:    VS:  BP (!) 148/82   Pulse 86   Ht '5\' 2"'$  (1.575 m)   Wt 192 lb (87.1 kg)   SpO2 98%   BMI 35.12 kg/m    General: Pleasant female appearing in no acute distress. Head: Normocephalic, atraumatic. Neck: No carotid bruits. JVD not elevated.  Lungs: Respirations regular and unlabored, without wheezes or rales.  Heart: Regular rate and rhythm. No S3 or S4.  No murmur, no rubs, or gallops appreciated. Abdomen: Appears non-distended. No obvious abdominal masses. Msk:  Strength and tone appear normal for age. No obvious joint deformities or effusions. Extremities: No clubbing or cyanosis. Trace lower extremity edema.  Distal pedal pulses are 2+ bilaterally. Neuro: Alert and oriented X 3. Moves all extremities spontaneously. No focal deficits noted. Psych:  Responds to questions appropriately with a normal affect. Skin: No rashes or lesions noted  Wt Readings from Last 3 Encounters:  05/14/22 192 lb (87.1 kg)  05/08/22 197 lb (89.4 kg)  04/18/22 194 lb 3.2 oz (88.1 kg)     Studies/Labs Reviewed:   EKG:  EKG is not ordered today.   Recent Labs: 09/10/2021: ALT 19; Hemoglobin 14.4; Platelets 196; TSH 0.11 03/18/2022: BUN  18; Creatinine, Ser 1.10; Magnesium 2.4; Potassium 4.6; Sodium 138   Lipid Panel    Component Value Date/Time   CHOL 147 06/01/2019 1045   TRIG 79 06/01/2019 1045   HDL 65 06/01/2019 1045   CHOLHDL 2.3 06/01/2019 1045   VLDL 16 06/01/2019 1045   LDLCALC 66 06/01/2019 1045    Additional studies/ records that were reviewed today include:   Echocardiogram: 12/2020 IMPRESSIONS     1. Left ventricular ejection fraction, by estimation, is 60 to 65%. The  left ventricle has normal function. The left ventricle has no regional  wall motion abnormalities. There is mild left ventricular hypertrophy.  Left ventricular diastolic parameters  are indeterminate.   2. Right ventricular systolic function is normal. The right ventricular  size is normal. Tricuspid regurgitation signal is inadequate for assessing  PA pressure.   3. The mitral valve is grossly normal. Trivial mitral valve  regurgitation.   4. The aortic valve is tricuspid. Aortic valve regurgitation is mild.   5. The inferior vena cava is normal in size with greater than 50%  respiratory variability, suggesting right atrial pressure of 3 mmHg.   Assessment:    1. Chronic heart failure with preserved ejection fraction ([place])   2. Essential hypertension   3. Mixed hyperlipidemia   4. Palpitations      Plan:   In order of problems listed above:  1. HFpEF - Echocardiogram last year was reassuring and showed a preserved EF of 60  to 65% and she did have mild LVH with indeterminate diastolic parameters. She reports her lower extremity edema did improve with resumption of Lasix 40 mg daily and her weight has been stable on her home scales. Recent labs last month showed her K+ had normalized to 4.9 and creatinine was stable at 0.95. Will continue with Lasix 40 mg daily.  2. HTN - Her blood pressure was initially recorded at 150/88, rechecked and slightly improved to 148/82. She has been checking BP at home and this has been  variable with SBP from the 130's to 160's. Since her last visit, she did stop Losartan due to this possibly contributing to swelling of her tongue and she reports Spironolactone was also discontinued by her PCP due to hyperkalemia. She is currently taking Amlodipine 10 mg daily, Lasix 40 mg daily and Hydralazine 25 mg twice daily. Will plan to titrate Hydralazine to 50 mg BID and I encouraged her to make [place] aware if her BP remains above goal.  3. HLD - Followed by her PCP. Recent FLP last month showed total cholesterol 134, triglycerides 71, HDL 58 and LDL 62. Continue current medical therapy with Lovastatin 40 mg daily.  4. Palpitations - She denies any recent symptoms. No longer on Atenolol due to this contributing to fatigue. No indication to restart beta-blocker therapy at this time.    Medication Adjustments/Labs and Tests Ordered: Current medicines are reviewed at length with the patient today.  Concerns regarding medicines are outlined above.  Medication changes, Labs and Tests ordered today are listed in the Patient Instructions below. Patient Instructions  Medication Instructions:  Your physician has recommended you make the following change in your medication:   Increase Hydralazine to 50 mg Two Times Daily   *If you need a refill on your cardiac medications before your next appointment, please call your pharmacy*   Lab Work: NONE   If you have labs (blood work) drawn today and your tests are completely normal, you will receive your results only by: [place] (if you have MyChart) OR A paper copy in the mail If you have any lab test that is abnormal or we need to change your treatment, we will call you to review the results.   Testing/Procedures: NONE    Follow-Up: At [facility], you and your health needs are our priority.  As part of our continuing mission to provide you with exceptional heart care, we have created designated Provider Care Teams.  These  Care Teams include your primary Cardiologist (physician) and Advanced Practice Providers (APPs -  Physician Assistants and Nurse Practitioners) who all work together to provide you with the care you need, when you need it.  We recommend signing up for the patient portal called "MyChart".  Sign up information is provided on this After Visit Summary.  MyChart is used to connect with patients for Virtual Visits (Telemedicine).  Patients are able to view lab/test results, encounter notes, upcoming appointments, etc.  Non-urgent messages can be sent to your provider as well.   To learn more about what you can do with MyChart, go to [email or web address].    Your next appointment:   3-4 month(s)  The format for your next appointment:   In Person  Provider:   You may see Carlyle Dolly, MD or one of the following Advanced Practice Providers on your designated Care Team:   Bernerd Pho, PA-C  Ermalinda Barrios, PA-C     Other Instructions Thank you for choosing [facility]!    Important Information About Sugar         Signed, Erma Heritage, PA-C  05/14/2022 2:49 PM    [place] [facility] [address] [place], [place] [address] Phone: [phone] Fax: [phone]

## 2022-06-09 NOTE — Progress Notes (Unsigned)
GI Office Note    Referring Provider: Celene Squibb, MD Primary Care Physician:  Celene Squibb, MD Primary Gastroenterologist: Cristopher Estimable.Rourk, MD  Date:  06/10/2022  ID:  [name], DOB 05-13-1946, MRN [id number]   Chief Complaint   Chief Complaint  Patient presents with   Follow-up   History of Present Illness  [name] is a 76 y.o. female with a history of opioid-induced, constipation, adenomatous colon polyps, GERD, esophageal dysphagia with Schatzki's ring, oropharyngeal dysphagia, esophageal variceal tumor s/p EMR 2011 at [facility] presenting today for follow-up.  Last colonoscopy January 2020 with adenomatous colon polyps.  No repeat due to age.   History of esophageal dysphagia with EGD in April 2021 with Schatzki's ring s/p dilation.  History of oropharyngeal dysphagia demonstrated multiple prior modified barium swallow studies.  Also seen by Dr. Newman Pies at [facility] for uncontrolled GERD in 2014, had multiple EGDs, normal GES, and tried various PPIs.  Esophageal manometry was attempted but she was unable to tolerate the prep.   Seen previously earlier this year for unintentional weight loss in 3-4 months of early satiety.  She underwent EGD in March 2023 during hospitalization for hypokalemia and AKI.  She had lites to include esophageal mucosa with cytology negative for yeast, medium sized hiatal hernia, grade a reflux esophagitis, Schatzki's ring s/p dilation and normal stomach and duodenum.  She subsequently underwent bedside swallow evaluation with speech therapy revealing oropharyngeal dysphagia with prolonged AP, uncoordinated with poor bolus cohesion, moderate oral residue after initial swallow.  She had very slow A-P transition with pures and suspected delayed swallow trigger without any signs of aspiration.  She was recommended following dysphagia 2/finely chopped meats.   Labs in March 2023 with negative stool studies, CBC and CMP without significant abnormalities.  TSH  low at 0.11.  Normal lipase.  His abdomen regarding issues with diarrhea patient is also having some epigastric pain as she had been off PPI due to confusion had to take medications at discharge.   Repeat modified barium swallow study 09/27/2021 with prolonged oral transit with solids with impairment normal movement resulting piecemeal duplication with timely swallow trigger, trace vallecular and lateral channel residues after liquids which clear with repeat swallow.  She delayed transit of barium tablet into the distal esophagus which eventually cleared with liquid wash.  This point she is recommended to have any diet textures that she would like, just needs to allow more time and put forth more effort with swallowing.  No ongoing therapy recommended.  Visit in June 2023 with occasional breakthrough GERD symptoms with particular foods on pantoprazole 40 mg twice daily.  Stated Aciphex seem to work better for her than Protonix, however did not want to change medications.  Had epigastric pain 1-2 times per week without any specific trigger.  Denied nausea/vomiting.  Oropharyngeal symptoms stable.  Weight loss.  Advise follow-up in 4 months, consider changing PPI from pantoprazole to Aciphex.  Last office visit 03/29/2022.  Reflux fairly controlled on pantoprazole, having burning in her mouth with anything she eats.  Denied nausea/vomiting.  Continues to use softer foods.  Reported green potatoes harder for her to swallow as well as bananas and eggs.  Reported tongue swelling affecting her swallowing.  Patient unsure if tongue swelling started after increasing ARB.  Newly started on losartan, levothyroxine.  Denies diarrhea, weight loss.  Spending a lot of time in the bed due to headaches, no melena or BRBPR.  Mild erythema noted to posterior  oropharynx.  Advise continue pantoprazole 40 mg daily.  Prescription given for Magic mouthwash.  Advised to discuss stopping losartan with PCP regarding tongue swelling.   Consider speech therapy follow-up for MBSS if worsening dysphagia.  Follow-up in 2 months.  Today: GERD - It is "okay". Taking famotidine for breakthrough symptoms. Still wants to stay on pantoprazole 40 mg BID.  Denies abdominal pain, vomiting. Does not eat as much as she was. Not feeling hungry. Has not lost weight. Does have some daily morning nausea with her potassium supplement. Denise any nutritional supplements. Took Trulance for a while until March when she stopped because a protein shake she was taking was keeping her regular. Still drinking those.  Dysphagia- Still having issues with tongue being swollen. Has seen her dentist who though it may be a neurological issues. Swallowing gong fairly well but not as bad as it was. Still has a problems with grits, eggs, mashed potatoes. Able to eat tossed salads with no problem.   In the last couple of weeks she has noticed some constipation. Has been taking a stool softener 3 times per day. Having a half a BM every day. Daily BM but incomplete. Stools are soft. Denies any new medications or significant dietary changes. Denies melena or BRBPR. Not drinking water like she used to - forgets. Would like to go back on trulance. Is straining.    Current Outpatient Medications  Medication Sig Dispense Refill   acetaminophen (TYLENOL) 500 MG tablet Take 2 tablets (1,000 mg total) by mouth every 8 (eight) hours as needed for mild pain, moderate pain or fever.     AIMOVIG 70 MG/ML SOAJ Inject 70 mg into the skin every 30 (thirty) days.     albuterol (PROVENTIL HFA;VENTOLIN HFA) 108 (90 BASE) MCG/ACT inhaler Inhale 2 puffs into the lungs every 6 (six) hours as needed for wheezing or shortness of breath.      amLODipine (NORVASC) 10 MG tablet Take 10 mg by mouth daily.     Atogepant (QULIPTA) 30 MG TABS Take 30 mg by mouth daily. 30 tablet 6   azelastine (ASTELIN) 0.1 % nasal spray in the morning and at bedtime.     Azelastine-Fluticasone 137-50 MCG/ACT SUSP  Spray 1 spray twice a day by intranasal route.     BELSOMRA 10 MG TABS Take 1 tablet by mouth at bedtime.     Calcium Carbonate-Vit D-Min (CALCIUM 1200 PO) Take by mouth daily.     clobetasol ointment (TEMOVATE) 0.05 % APPLY A THIN LAYER TO THE AFFECTED AREA(S) BY TOPICAL ROUTE 2 TIMES PER DAY     famotidine (PEPCID) 20 MG tablet TAKE TWO TABLETS ('40MG'$  TOTAL) BY MOUTH AT BEDTIME     furosemide (LASIX) 40 MG tablet Take 1 tablet (40 mg total) by mouth daily. 90 tablet 3   hydrALAZINE (APRESOLINE) 50 MG tablet Take 1 tablet (50 mg total) by mouth 2 (two) times daily. 180 tablet 3   hydrOXYzine (ATARAX) 25 MG tablet Take one tablet by mouth once daily as needed for anxiety     levothyroxine (SYNTHROID) 25 MCG tablet Take 25 mcg by mouth daily.     lovastatin (MEVACOR) 40 MG tablet Take 40 mg by mouth at bedtime.     Multiple Vitamins-Minerals (CENTRUM SILVER 50+WOMEN PO) Take by mouth daily.     pantoprazole (PROTONIX) 40 MG tablet TAKE ONE TABLET ('40MG'$  TOTAL) BY MOUTH TWO TIMES DAILY 60 tablet 5   Plecanatide (TRULANCE) 3 MG TABS Take 3 mg by  mouth daily. 30 tablet 3   potassium chloride SA (KLOR-CON M) 20 MEQ tablet Take 1 tablet (20 mEq total) by mouth daily.     Ubrogepant (UBRELVY) 100 MG TABS Take 100 mg by mouth as needed (for migraines). May repeat a dose in 2 hours if headache persists. Max dose 2 pills in 24 hours 16 tablet 6   zolpidem (AMBIEN) 10 MG tablet Take 10 mg by mouth at bedtime as needed.     magic mouthwash w/lidocaine SOLN Take 5 mLs by mouth 2 (two) times daily as needed for mouth pain. Swish in the oral cavity for 1-2 minutes and then spit out the liquid. Do not swallow. 400 mL 0   No current facility-administered medications for this visit.    Past Medical History:  Diagnosis Date   Adenomatous colon polyp 2006   excised in 2006 & 2010Due surveillance 06/2013   Anxiety    Anxiety and depression    Asthma    Breast mass    right nipple bengn mass per patient    Breast tumor    Chest discomfort    Chronic back pain    Chronic constipation    Complication of anesthesia    Depression    Diverticulosis    Fibromyalgia    Gastroesophageal reflux disease    Hiatal hernia    History of benign esophageal tumor 2010   granular cell esophageal tumor (Dx 06/2008), resected via EMR 2011, due repeat EGD 02/2012   Hyperlipidemia    Hypertension    Insomnia    Migraines    PONV (postoperative nausea and vomiting)    Psychosis ([place])    Sleep apnea    Stop Bang score of 5. Pt said she was told by Dr. Merlene Laughter that she had "a little bit" of sleep apena, but not bad enough to treat.   Thyroid nodule    Tumor of esophagus     Past Surgical History:  Procedure Laterality Date   ABDOMINAL HYSTERECTOMY     BALLOON DILATION  08/23/2021   Procedure: BALLOON DILATION;  Surgeon: Eloise Harman, DO;  [facility];  Service: Endoscopy;;   BIOPSY  10/14/2019   Procedure: BIOPSY;  Surgeon: Daneil Dolin, MD;  [facility]: [facility];  Service: Endoscopy;;   BRAVO [place] STUDY  12/11/2011   Procedure: BRAVO [place];  Surgeon: Daneil Dolin, MD;  [facility];  Service: Endoscopy;  Laterality: N/A;   BREAST EXCISIONAL BIOPSY  1990s, 2012   Left x2-sclerosing ductal papilloma-2012   CHOLECYSTECTOMY N/A 04/09/2013   Procedure: LAPAROSCOPIC CHOLECYSTECTOMY;  Surgeon: Jamesetta So, MD;  Location: [facility];  Service: General;  Laterality: N/A;   COLONOSCOPY  06/2008, 06/2011   sigmoid tics, tubular adenoma; 2013: anal canal hemorrhoids   COLONOSCOPY  07/10/2011   Anal canal hemorrhoids likely the cause of hematochezia in the setting of constipation; otherwise normal rectum ;submucosal  petechiae in left colon of doubtful clinical significance; otherwise, normal colon   COLONOSCOPY WITH PROPOFOL N/A 11/10/2017   cancelled in pre-op   COLONOSCOPY WITH PROPOFOL N/A 07/23/2018   Procedure: COLONOSCOPY WITH PROPOFOL;  Surgeon: Daneil Dolin, MD; 1  tubular adenoma, diverticulosis in the sigmoid and descending colon, nonbleeding internal hemorrhoids.  No recommendations to repeat due to age.   ESOPHAGEAL BRUSHING  08/23/2021   Procedure: ESOPHAGEAL BRUSHING;  Surgeon: Eloise Harman, DO;  [facility];  Service: Endoscopy;;   ESOPHAGEAL DILATION N/A 05/08/2015  Procedure: ESOPHAGEAL DILATION;  Surgeon: Daneil Dolin, MD;  Location: [facility];  Service: Endoscopy;  Laterality: N/AVenia Minks 54/56   ESOPHAGOGASTRODUODENOSCOPY  12/11/2011   MHD:[id number] Schatzki's ring; otherwise normal/Small hiatal hernia. Antral and bulbar erosions   ESOPHAGOGASTRODUODENOSCOPY (EGD) WITH PROPOFOL N/A 05/08/2015   Dr. Gala Romney: mild erosive reflux esophagitis, non-critical Schatzki's ring s/p dilation. Hiatal hernia.    ESOPHAGOGASTRODUODENOSCOPY (EGD) WITH PROPOFOL N/A 10/14/2019   Procedure: ESOPHAGOGASTRODUODENOSCOPY (EGD) WITH PROPOFOL;  Surgeon: Daneil Dolin, MD;  Nonobstructing Schatzki ring, small hiatal hernia, abnormal gastric mucosa s/p biopsied, normal first and second portion of the duodenum.  Pathology with chronic inactive gastritis, no H. pylori.   ESOPHAGOGASTRODUODENOSCOPY (EGD) WITH PROPOFOL N/A 08/23/2021   Surgeon: Eloise Harman, DO;   white speckled mucosa in the esophagus s/p cells obtained for cytology (KOH prep negative), medium-sized hiatal hernia, grade A reflux esophagitis, mild Schatzki's ring s/p dilation, normal examined stomach and duodenum.   EUS  08/2010   Dr [facility] with EGD. Retained food. No recurrent esophageal lesion, bx negative.   IR KYPHO THORACIC WITH BONE BIOPSY  10/17/2020   IR RADIOLOGIST EVAL & MGMT  09/12/2020   LUNG BIOPSY     negative   PARATHYROIDECTOMY  09/2015   [facility].    PARATHYROIDECTOMY     POLYPECTOMY  07/23/2018   Procedure: POLYPECTOMY;  Surgeon: Daneil Dolin, MD;  [facility];  Service: Endoscopy;;  colon   RIGHT OOPHORECTOMY     benign disease   SHOULDER  ARTHROSCOPY     Right; bone spurs removed   TONSILLECTOMY     TOTAL KNEE ARTHROPLASTY  2002   Right; previous arthroscopic surgery    Family History  Problem Relation Age of Onset   Stroke Father    Alcohol abuse Father    Heart attack Mother    Depression Mother    Anxiety disorder Mother    Colon cancer Paternal Aunt    Colon cancer Paternal Uncle    Dementia Maternal Uncle    ADD / ADHD Neg Hx    Bipolar disorder Neg Hx    Drug abuse Neg Hx    OCD Neg Hx    Paranoid behavior Neg Hx    Schizophrenia Neg Hx    Seizures Neg Hx    Sexual abuse Neg Hx    Physical abuse Neg Hx     Allergies as of 06/10/2022 - Review Complete 06/10/2022  Allergen Reaction Noted   Elavil [amitriptyline] Other (See Comments) 06/03/2012   Abilify [aripiprazole] Other (See Comments) 11/25/2012   Codeine Hives, Nausea Only, and Other (See Comments)    Latex Hives 11/21/2011   Penicillins Hives, Itching, and Other (See Comments)    Polyethylene glycol Other (See Comments) 12/26/2012   Spironolactone  05/14/2022   Sulfonamide derivatives Nausea And Vomiting    Cymbalta [duloxetine hcl] Rash 03/10/2013   Remeron [mirtazapine] Other (See Comments) 05/06/2012    Social History   Socioeconomic History   Marital status: Single    Spouse name: Not on file   Number of children: 1   Years of education: Not on file   Highest education level: Not on file  Occupational History   Occupation: disabled    Employer: RETIRED  Tobacco Use   Smoking status: Never   Smokeless tobacco: Never  Vaping Use   Vaping Use: Never used  Substance and Sexual Activity   Alcohol use: No    Alcohol/week: 0.0 standard drinks of alcohol  Drug use: No   Sexual activity: Never  Other Topics Concern   Not on file  Social History Narrative   Not on file   Social Determinants of Health   Financial Resource Strain: Not on file  Food Insecurity: Not on file  Transportation Needs: Not on file  Physical Activity:  Not on file  Stress: Not on file  Social Connections: Not on file     Review of Systems   Gen: Denies fever, chills, anorexia. Denies fatigue, weakness, weight loss.  CV: Denies chest pain, palpitations, syncope, peripheral edema, and claudication. Resp: Denies dyspnea at rest, cough, wheezing, coughing up blood, and pleurisy. GI: See HPI Derm: Denies rash, itching, dry skin Psych: Denies depression, anxiety, memory loss, confusion. No homicidal or suicidal ideation.  Heme: Denies bruising, bleeding, and enlarged lymph nodes.   Physical Exam   BP (!) 155/87 (BP Location: Left Arm, Patient Position: Sitting, Cuff Size: Large)   Pulse 91   Temp 97.7 F (36.5 C) (Temporal)   Ht '5\' 2"'$  (1.575 m)   Wt 196 lb 12.8 oz (89.3 kg)   SpO2 98%   BMI 36.00 kg/m   General:   Alert and oriented. No distress noted. Pleasant and cooperative.  Head:  Normocephalic and atraumatic. Eyes:  Conjuctiva clear without scleral icterus. Mouth: Mask in place. Lungs:  Clear to auscultation bilaterally. No wheezes, rales, or rhonchi. No distress.  Heart:  S1, S2 present without murmurs appreciated.  Abdomen:  +BS, soft, non-tender and non-distended. No rebound or guarding. No HSM or masses noted. Rectal: deferred Msk:  Symmetrical without gross deformities. Normal posture. Extremities:  Without edema. Neurologic:  Alert and  oriented x4 Psych:  Alert and cooperative. Normal mood and affect.   Assessment  EMANI TAUSSIG is a 76 y.o. female with a history of opioid-induced, constipation, adenomatous colon polyps, GERD, esophageal dysphagia with Schatzki's ring, oropharyngeal dysphagia, esophageal variceal tumor s/p EMR 2011 at [facility] presenting today for follow-up. presenting today with   GERD: EGD in March 23 with grade A esophagitis, Schatzki's ring s/p dilation.  Has been maintained on pantoprazole 40 mg twice daily.  Previously also on Pepcid 20 mg daily.  Previously was on Aciphex in the past with  patient stating she felt like this worked better than pantoprazole however has declined changing regimens previously.  Would not like to change today.  Has only been using Pepcid as needed for breakthrough symptoms.  Dysphagia: History of esophageal and oropharyngeal dysphagia.  Extensive prior workup with multiple EGDs and MBSS.  Most recent EGD in March 2023 with Schatzki's ring s/p dilation.  Last MBSS in April 2023 with prolonged transit of solids and liquids with residuals after liquids which clears with repeat swallows.  Per speech she can have regular textures as long as she has an increased effort with swallowing and allows extra time. Still reporting some issues with tongue swelling, however pain was improved with Magic mouthwash, requesting refill today.  States her dentist told her it may be a neurological issue.  He is not due to see neurology for few months.  Dysphagia remains about the same, not significantly worse, no significant improvements.  Continues to have issues with the same foods.  Advised to continue previous swallowing precautions.  Constipation: Previous history of constipation maintained on Trulance prior to March of this year.  Had not been having any trouble up until the last few weeks with consumption of a daily protein/nutritional shake.  Recently over the last  few weeks has been having issues with constipation with the need to strain and incomplete emptying.  Has been taking a stool softener 3 times daily without much relief.  She would like to start back on Trulance.  Prescription sent in today.  Few samples provided today to start therapy.  PLAN   Continue pantoprazole 40 mg twice daily Famotidine as needed for breakthrough GERD diet/lifestyle modifications Continue soft diet and prior recommendations of speech therapy with extra effort with swallowing. Trulance 3 mg daily.  Increase water intake.  Magic mouthwash refilled.  Follow up in 4 months    Venetia Night, MSN, FNP-BC, AGACNP-BC [facility]

## 2022-06-10 ENCOUNTER — Ambulatory Visit (INDEPENDENT_AMBULATORY_CARE_PROVIDER_SITE_OTHER): Payer: Medicare Other | Admitting: Gastroenterology

## 2022-06-10 ENCOUNTER — Encounter: Payer: Self-pay | Admitting: Gastroenterology

## 2022-06-10 VITALS — BP 155/87 | HR 91 | Temp 97.7°F | Ht 62.0 in | Wt 196.8 lb

## 2022-06-10 DIAGNOSIS — K5909 Other constipation: Secondary | ICD-10-CM

## 2022-06-10 DIAGNOSIS — K219 Gastro-esophageal reflux disease without esophagitis: Secondary | ICD-10-CM | POA: Diagnosis not present

## 2022-06-10 DIAGNOSIS — R1312 Dysphagia, oropharyngeal phase: Secondary | ICD-10-CM | POA: Diagnosis not present

## 2022-06-10 DIAGNOSIS — R220 Localized swelling, mass and lump, head: Secondary | ICD-10-CM

## 2022-06-10 MED ORDER — MAGIC MOUTHWASH W/LIDOCAINE: 5.0000 mL | Freq: Two times a day (BID) | ORAL | 0 refills | Status: DC | PRN

## 2022-06-10 MED ORDER — TRULANCE 3 MG PO TABS
3.0000 mg | ORAL_TABLET | Freq: Every day | ORAL | 3 refills | Status: DC
Start: 2022-06-10 — End: 2022-10-10

## 2022-06-10 NOTE — Patient Instructions (Signed)
Continue pantoprazole 40 mg twice daily and famotidine as needed for breakthrough symptoms.  Follow a GERD diet:  Avoid fried, fatty, greasy, spicy, citrus foods. Avoid caffeine and carbonated beverages. Avoid chocolate. Try eating 4-6 small meals a day rather than 3 large meals. Do not eat within 3 hours of laying down. Prop head of bed up on wood or bricks to create a 6 inch incline.  Continue to allow adequate time with eating with extra effort with swallowing per speech therapy.  Continue to avoid foods that make it harder to swallow including eggs, mashed potatoes, grits, things that are higher in residue.  I provided him some samples of Trulance today, he may use this to help with your constipation while you await to obtain your prescription.  Also please try to increase your water intake with at least 4 to 6 glasses of water daily.  Magic mouthwash refilled for you today to use as needed for pain to your tongue and mouth.  We will see you for follow-up in 4 months!  I hope you have a wonderful Christmas! I hope you have a wonderful new year!  It was a pleasure to see you today. I want to create trusting relationships with patients. If you receive a survey regarding your visit,  I greatly appreciate you taking time to fill this out on paper or through your MyChart. I value your feedback.  Venetia Night, MSN, FNP-BC, AGACNP-BC [facility]

## 2022-06-11 ENCOUNTER — Telehealth: Payer: Self-pay | Admitting: *Deleted

## 2022-06-11 NOTE — Telephone Encounter (Signed)
Pt's sister Enid Derry [facility]) called and would like for her to be referred to speech therapy and would like to know if her thyroid issues may have anything to do with her swallowing problems.

## 2022-06-12 NOTE — Telephone Encounter (Signed)
Spoke to pt's sister [name] [facility]) informed her recommendations. She voiced understanding.

## 2022-07-04 ENCOUNTER — Other Ambulatory Visit: Payer: Self-pay | Admitting: *Deleted

## 2022-07-04 DIAGNOSIS — R1312 Dysphagia, oropharyngeal phase: Secondary | ICD-10-CM

## 2022-07-04 NOTE — Telephone Encounter (Signed)
Referral sent 

## 2022-07-16 ENCOUNTER — Other Ambulatory Visit (HOSPITAL_COMMUNITY): Payer: Self-pay | Admitting: Occupational Therapy

## 2022-07-16 DIAGNOSIS — R1312 Dysphagia, oropharyngeal phase: Secondary | ICD-10-CM

## 2022-07-16 DIAGNOSIS — K219 Gastro-esophageal reflux disease without esophagitis: Secondary | ICD-10-CM

## 2022-07-18 ENCOUNTER — Telehealth: Payer: Self-pay | Admitting: Psychiatry

## 2022-07-18 NOTE — Telephone Encounter (Signed)
I called and spoke with patient, she reports her swollen tongue is not a new problems, but appears to be worse with generic Qulipta 30 mg, she stopped medication 1 week ago. Reports her tongue is still swollen. Patient said she saw her dentist 2 weeks ago and he told patient her complaint sounds neuro related, she is scheduled for a Swallowing Function test on 07/22/22 ordered by GI. Patient reports she is not able to eat, but able to drink fluids fine.   She also reports not be able to sleep at night, unable to fall asleep. Patient said "I just lay in the bed all night".    I suggested she keep her Swallowing Function test and follow up with PCP regarding her sleep problems. I told patient I will run this information by Dr.Chima to help from a migraine stand point. I did relay that Dr. Billey Gosling is out of the office. Patient verbalized she understood.

## 2022-07-18 NOTE — Telephone Encounter (Signed)
Pt called stating that the Atogepant (QULIPTA) 30 MG TABS is making her tongue swell and would like to know if something else can be prescribed to her and she would also like to know if she cab get something to help her sleep as well.

## 2022-07-22 ENCOUNTER — Encounter (HOSPITAL_COMMUNITY): Payer: Self-pay | Admitting: Speech Pathology

## 2022-07-22 ENCOUNTER — Ambulatory Visit (HOSPITAL_COMMUNITY)
Admission: RE | Admit: 2022-07-22 | Discharge: 2022-07-22 | Disposition: A | Discharge status: Home / Self Care | Payer: 59 | Source: Ambulatory Visit | Attending: Gastroenterology | Admitting: Gastroenterology

## 2022-07-22 ENCOUNTER — Ambulatory Visit (HOSPITAL_COMMUNITY): Payer: 59 | Attending: Internal Medicine | Admitting: Speech Pathology

## 2022-07-22 DIAGNOSIS — R1312 Dysphagia, oropharyngeal phase: Secondary | ICD-10-CM | POA: Diagnosis not present

## 2022-07-22 DIAGNOSIS — R1310 Dysphagia, unspecified: Secondary | ICD-10-CM | POA: Diagnosis not present

## 2022-07-22 DIAGNOSIS — K219 Gastro-esophageal reflux disease without esophagitis: Secondary | ICD-10-CM | POA: Diagnosis not present

## 2022-07-22 NOTE — Therapy (Signed)
[facility] at [place], [place], [address] Phone: [phone]   Fax:  [phone]  Modified Barium Swallow  Patient Details  Name: [name] MRN: [id number] Date of Birth: 24-May-1946 No data recorded  Encounter Date: 07/22/2022   End of Session - 07/22/22 1323     Visit Number 1    Number of Visits 1    Authorization Type U[employer] Medicare    SLP Start Time 5681    SLP Stop Time  1225    SLP Time Calculation (min) 40 min    Activity Tolerance Patient tolerated treatment well             Past Medical History:  Diagnosis Date   Adenomatous colon polyp 2006   excised in 2006 & 2010Due surveillance 06/2013   Anxiety    Anxiety and depression    Asthma    Breast mass    right nipple bengn mass per patient   Breast tumor    Chest discomfort    Chronic back pain    Chronic constipation    Complication of anesthesia    Depression    Diverticulosis    Fibromyalgia    Gastroesophageal reflux disease    Hiatal hernia    History of benign esophageal tumor 2010   granular cell esophageal tumor (Dx 06/2008), resected via EMR 2011, due repeat EGD 02/2012   Hyperlipidemia    Hypertension    Insomnia    Migraines    PONV (postoperative nausea and vomiting)    Psychosis ([place])    Sleep apnea    Stop Bang score of 5. Pt said she was told by Dr. Merlene Laughter that she had "a little bit" of sleep apena, but not bad enough to treat.   Thyroid nodule    Tumor of esophagus     Past Surgical History:  Procedure Laterality Date   ABDOMINAL HYSTERECTOMY     BALLOON DILATION  08/23/2021   Procedure: BALLOON DILATION;  Surgeon: Eloise Harman, DO;  [facility];  Service: Endoscopy;;   BIOPSY  10/14/2019   Procedure: BIOPSY;  Surgeon: Daneil Dolin, MD;  [facility];  Service: Endoscopy;;   BRAVO [place] STUDY  12/11/2011   Procedure: BRAVO [place];  Surgeon: Daneil Dolin, MD;  [facility];  Service: Endoscopy;  Laterality: N/A;   BREAST EXCISIONAL BIOPSY  1990s, 2012   Left x2-sclerosing ductal papilloma-2012   CHOLECYSTECTOMY N/A 04/09/2013   Procedure: LAPAROSCOPIC CHOLECYSTECTOMY;  Surgeon: Jamesetta So, MD;  Location: [facility];  Service: General;  Laterality: N/A;   COLONOSCOPY  06/2008, 06/2011   sigmoid tics, tubular adenoma; 2013: anal canal hemorrhoids   COLONOSCOPY  07/10/2011   Anal canal hemorrhoids likely the cause of hematochezia in the setting of constipation; otherwise normal rectum ;submucosal  petechiae in left colon of doubtful clinical significance; otherwise, normal colon   COLONOSCOPY WITH PROPOFOL N/A 11/10/2017   cancelled in pre-op   COLONOSCOPY WITH PROPOFOL N/A 07/23/2018   Procedure: COLONOSCOPY WITH PROPOFOL;  Surgeon: Daneil Dolin, MD; 1 tubular adenoma, diverticulosis in the sigmoid and descending colon, nonbleeding internal hemorrhoids.  No recommendations to repeat due to age.   ESOPHAGEAL BRUSHING  08/23/2021   Procedure: ESOPHAGEAL BRUSHING;  Surgeon: Eloise Harman, DO;  [facility];  Service: Endoscopy;;   ESOPHAGEAL DILATION N/A 05/08/2015   Procedure: ESOPHAGEAL DILATION;  Surgeon: Daneil Dolin, MD;  Location: [facility];  Service: Endoscopy;  Laterality: N/AVenia Minks 54/56   ESOPHAGOGASTRODUODENOSCOPY  12/11/2011   OVF:IEPPIRJJOAC Schatzki's ring; otherwise normal/Small hiatal hernia. Antral and bulbar erosions   ESOPHAGOGASTRODUODENOSCOPY (EGD) WITH PROPOFOL N/A 05/08/2015   Dr. Gala Romney: mild erosive reflux esophagitis, non-critical Schatzki's ring s/p dilation. Hiatal hernia.    ESOPHAGOGASTRODUODENOSCOPY (EGD) WITH PROPOFOL N/A 10/14/2019   Procedure: ESOPHAGOGASTRODUODENOSCOPY (EGD) WITH PROPOFOL;  Surgeon: Daneil Dolin, MD;  Nonobstructing Schatzki ring, small hiatal hernia, abnormal gastric mucosa s/p biopsied, normal first and second portion of the duodenum.  Pathology with chronic inactive gastritis, no H.  pylori.   ESOPHAGOGASTRODUODENOSCOPY (EGD) WITH PROPOFOL N/A 08/23/2021   Surgeon: Eloise Harman, DO;   white speckled mucosa in the esophagus s/p cells obtained for cytology (KOH prep negative), medium-sized hiatal hernia, grade A reflux esophagitis, mild Schatzki's ring s/p dilation, normal examined stomach and duodenum.   EUS  08/2010   Dr [facility] with EGD. Retained food. No recurrent esophageal lesion, bx negative.   IR KYPHO THORACIC WITH BONE BIOPSY  10/17/2020   IR RADIOLOGIST EVAL & MGMT  09/12/2020   LUNG BIOPSY     negative   PARATHYROIDECTOMY  09/2015   [facility].    PARATHYROIDECTOMY     POLYPECTOMY  07/23/2018   Procedure: POLYPECTOMY;  Surgeon: Daneil Dolin, MD;  [facility];  Service: Endoscopy;;  colon   RIGHT OOPHORECTOMY     benign disease   SHOULDER ARTHROSCOPY     Right; bone spurs removed   TONSILLECTOMY     TOTAL KNEE ARTHROPLASTY  2002   Right; previous arthroscopic surgery    There were no vitals filed for this visit.   Subjective Assessment - 07/22/22 1304     Subjective "I have trouble swallowing scrambled eggs, bread, and cream potatoes."    Patient is accompained by: Family member    Special Tests MBSS    Currently in Pain? No/denies                 General - 07/22/22 1306       General Information   Date of Onset 07/04/22    HPI [name] is a 77 yo female who was referred for MBSS by Venetia Night, NP of GI due to ongoing reports of difficulty swallowing foods. Pt is known to SLP service from previous MBSS (last one 09/28/2022). Pt last had EGD in March of 2023 with dilation (mild Schatzki's ring, esophagitis, and moderate size hiatal hernia). Pt was accompanied to today's appointment by her sister, [name]. Pt previously took neuroleptic medications and developed Tardive Dyskinesia, but she stopped those medications several months ago and no longer has TD. Pt does report that she feels as if her tongue is swollen. Oral  motor examination is WNL, tongue appears mildly large?    Type of Study MBS-Modified Barium Swallow Study    Previous Swallow Assessment 09/27/2021 MBSS D3/thin    Diet Prior to this Study Dysphagia 3 (soft);Regular;Thin liquids    Temperature Spikes Noted No    Respiratory Status Room air    History of Recent Intubation No    Behavior/Cognition Alert;Cooperative;Pleasant mood    Oral Cavity Assessment Within Functional Limits    Oral Care Completed by SLP No    Oral Cavity - Dentition Dentures, top    Vision Functional for self feeding    Self-Feeding Abilities Able to feed self    Patient Positioning Upright in chair    Baseline Vocal Quality  Normal    Volitional Cough Strong    Volitional Swallow Able to elicit    Anatomy Within functional limits    Pharyngeal Secretions Not observed secondary MBS                Oral Preparation/Oral Phase - 07/22/22 1317       Oral - Thin   Oral - Thin Teaspoon Within functional limits    Oral - Thin Cup Within functional limits    Oral - Thin Straw Within functional limits      Oral - Solids   Oral - Puree Piecemeal swallowing    Oral - Regular Within functional limits    Oral - Pill Within functional limits      Electrical stimulation - Oral Phase   Was Electrical Stimulation Used No              Pharyngeal Phase - 07/22/22 1319       Pharyngeal - Thin   Pharyngeal- Thin Teaspoon Within functional limits    Pharyngeal- Thin Cup Within functional limits;Pharyngeal residue - valleculae;Pharyngeal residue - pyriform   trace residuals after primary swallow, clears with spontaneous second swallow   Pharyngeal- Thin Straw Within functional limits      Pharyngeal - Solids   Pharyngeal- Puree Within functional limits    Pharyngeal- Regular Within functional limits    Pharyngeal- Pill Within functional limits;Other (Comment)   min vallecular and pyriform residue of thins when taking pill, clears with repeat swallow      Electrical Stimulation - Pharyngeal Phase   Was Electrical Stimulation Used No              Cricopharyngeal Phase - 07/22/22 1321       Cervical Esophageal Phase   Cervical Esophageal Phase Impaired      Cervical Esophageal Phase - Comment   Other Esophageal Phase Observations Transient delay of barium tablet in distal esophagus which cleared with liquid wash               Plan - 07/22/22 1324     Clinical Impression Statement MBSS completed this date with similar results to all previous MBSS. Pt with min prolonged oral prep and piecemeal deglutition with puree (Pt took 4 swallows to clear), however when cued to collect the bolus and swallow in one bite on the next trial, Pt was able to do so in one swallow. Pt reported that it felt "awkward" to do so. Pt with normal oral bolus transit time with regular textures (graham crackers) and with barium tablet with thin. Swallow trigger is generally at the level of the valleculae and Pt with trace/min vallecular and pyriform residue after the initial swallow, but elicits a spontaneous second swallow and clears. Esophageal sweep completed and revealed brief stasis of barium tablet in the distal esophagus, but did clear with liquid wash. SLP reviewed the imaging with Pt and sister [name]) to show WNL imaging across textures and consistencies. Interestingly, Pt reports that she has no trouble eating a tossed salad with added meats and vegetables, but has difficulty swallowing scrambled eggs, breads, and cream potatoes. Pt reports that she buys a scambled egg biscuit from [place] and has difficulty clearing. Pt's sister questions whether her dysphagia can be attributed to "functional" deficits due to h/o anxiety. This is something to consider considering WNL imaging on all previous MBSS. Recommend self regulated regular textures and thin liquids with Pt to take small bites of solids and alternate solids with liquids,  avoid textures she finds  challenging if necessary, PO medications whole with liquids. No further SLP services indicated at this time.    Treatment/Interventions Patient/family education    Consulted and Agree with Plan of Care Patient;Family member/caregiver             Patient will benefit from skilled therapeutic intervention in order to improve the following deficits and impairments:   Dysphagia, oropharyngeal phase     Recommendations/Treatment - 07/22/22 1322       Swallow Evaluation Recommendations   SLP Diet Recommendations Age appropriate regular;Dysphagia 3 (mechanical soft);Thin    Liquid Administration via Cup;Straw    Medication Administration Whole meds with liquid    Supervision Patient able to self feed    Compensations Follow solids with liquid    Postural Changes Seated upright at 90 degrees;Remain upright for at least 30 minutes after feeds/meals              Prognosis - 07/22/22 1322       Prognosis   Prognosis for Safe Diet Advancement Good    Barriers to Reach Goals --   question functional component to dysphagia     Individuals Consulted   Consulted and Agree with Results and Recommendations Patient;Family member/caregiver    Family Member Consulted sister    Report Sent to  Referring physician;Other (comment)   PCP            Problem List Patient Active Problem List   Diagnosis Date Noted   Diarrhea 12/12/2021   AKI (acute kidney injury) ([place]) 08/21/2021   Abnormal EKG 08/21/2021   Type 2 diabetes mellitus ([place]) 08/21/2021   Hypokalemia 08/20/2021   Early satiety 08/14/2021   Loss of weight 08/14/2021   Chronic diastolic heart failure ([place]) 04/01/2021   Chronic pain syndrome 04/01/2021   Chronic kidney disease, stage 3a ([place]) 12/21/2020   Neck pain 03/20/2020   Bloating 11/18/2019   Chronic migraine without aura, with status migrainosus 11/04/2019   Mild cognitive disorder 11/04/2019   Oropharyngeal dysphagia    H/O adenomatous polyp of colon  09/29/2017   Lower abdominal pain 09/29/2017   Obstructive sleep apnea 09/06/2017   Obesity (BMI 30-39.9) 03/07/2017   Primary osteoarthritis of both hands 03/07/2017   Lower extremity edema 03/03/2017   Restless legs syndrome 01/20/2017   Thyroid nodule 10/17/2016   Pruritus 10/06/2016   Major depressive disorder with psychotic features ([place]) 09/11/2016   Neurocognitive deficits 09/11/2016   (HFpEF) heart failure with preserved ejection fraction ([place]) 07/22/2016   Benzodiazepine withdrawal, with perceptual disturbance ([place]) 07/22/2016   Urinary frequency 11/07/2015   Abdominal pain 07/26/2015   Hyperparathyroidism ([place]) 06/15/2015   History of depression 06/14/2015   Hypercalcemia 06/13/2015   Major neurocognitive disorder ([place]) 06/13/2015   Confusion 05/29/2015   Odynophagia    Schatzki's ring    Hiatal hernia    Knee contusion 05/02/2014   Acquired trigger finger 10/12/2013   Chronic constipation 07/15/2013   Abdominal pain, epigastric 07/15/2013   LLQ pain 07/15/2013   Lightheaded 01/21/2013   Elevated LFTs 12/26/2012   Insomnia 05/06/2012   History of benign esophageal tumor    Adenomatous colon polyp    Epigastric pain 12/10/2010   DEGENERATIVE [place] DISEASE, LUMBOSACRAL SPINE 05/01/2010   Mixed hyperlipidemia 08/22/2009   THYROID NODULE 08/16/2009   Anxiety and depression 08/16/2009   Essential hypertension 08/16/2009   Gastroesophageal reflux disease 08/16/2009   FIBROMYALGIA 08/16/2009   Thank you,  Genene Churn, [place]  Cheryl Chay,  CCC-SLP 07/22/2022, 1:42 PM  [facility] at [place], [place], [address] Phone: [phone]   Fax:  [phone]  Name: ANDRA HESLIN MRN: [id number] Date of Birth: 1945-10-30

## 2022-07-23 MED ORDER — EMGALITY 120 MG/ML ~~LOC~~ SOAJ: 2.0000 | SUBCUTANEOUS | 0 refills | Status: DC

## 2022-07-23 MED ORDER — EMGALITY 120 MG/ML ~~LOC~~ SOAJ: 1.0000 | SUBCUTANEOUS | 6 refills | Status: DC

## 2022-07-23 NOTE — Telephone Encounter (Signed)
I sent an rx for Emgality to her pharmacy. She can stop Qulipta and start this for migraine prevention instead.

## 2022-07-23 NOTE — Telephone Encounter (Signed)
Patient informed with below.  

## 2022-07-23 NOTE — Addendum Note (Signed)
Addended by: Genia Harold on: 07/23/2022 12:50 PM   Modules accepted: Orders

## 2022-07-24 DIAGNOSIS — R7303 Prediabetes: Secondary | ICD-10-CM | POA: Diagnosis not present

## 2022-07-24 DIAGNOSIS — E039 Hypothyroidism, unspecified: Secondary | ICD-10-CM | POA: Diagnosis not present

## 2022-07-24 DIAGNOSIS — I10 Essential (primary) hypertension: Secondary | ICD-10-CM | POA: Diagnosis not present

## 2022-07-30 DIAGNOSIS — I130 Hypertensive heart and chronic kidney disease with heart failure and stage 1 through stage 4 chronic kidney disease, or unspecified chronic kidney disease: Secondary | ICD-10-CM | POA: Diagnosis not present

## 2022-07-30 DIAGNOSIS — N1831 Chronic kidney disease, stage 3a: Secondary | ICD-10-CM | POA: Diagnosis not present

## 2022-07-30 DIAGNOSIS — K219 Gastro-esophageal reflux disease without esophagitis: Secondary | ICD-10-CM | POA: Diagnosis not present

## 2022-07-30 DIAGNOSIS — Z0001 Encounter for general adult medical examination with abnormal findings: Secondary | ICD-10-CM | POA: Diagnosis not present

## 2022-07-30 DIAGNOSIS — E876 Hypokalemia: Secondary | ICD-10-CM | POA: Diagnosis not present

## 2022-07-30 DIAGNOSIS — E039 Hypothyroidism, unspecified: Secondary | ICD-10-CM | POA: Diagnosis not present

## 2022-07-30 DIAGNOSIS — G4700 Insomnia, unspecified: Secondary | ICD-10-CM | POA: Diagnosis not present

## 2022-07-30 DIAGNOSIS — I10 Essential (primary) hypertension: Secondary | ICD-10-CM | POA: Diagnosis not present

## 2022-07-30 DIAGNOSIS — L299 Pruritus, unspecified: Secondary | ICD-10-CM | POA: Diagnosis not present

## 2022-07-30 DIAGNOSIS — I5032 Chronic diastolic (congestive) heart failure: Secondary | ICD-10-CM | POA: Diagnosis not present

## 2022-07-30 DIAGNOSIS — R609 Edema, unspecified: Secondary | ICD-10-CM | POA: Diagnosis not present

## 2022-07-31 ENCOUNTER — Encounter: Payer: Self-pay | Admitting: Internal Medicine

## 2022-08-01 ENCOUNTER — Other Ambulatory Visit (HOSPITAL_COMMUNITY): Payer: Self-pay

## 2022-08-12 ENCOUNTER — Telehealth: Payer: Self-pay | Admitting: Psychiatry

## 2022-08-12 NOTE — Telephone Encounter (Signed)
Pt is calling stated she need a PA for Galcanezumab-gnlm (EMGALITY) 120 MG/ML SOAJ.

## 2022-08-12 NOTE — Telephone Encounter (Signed)
PA team please see the below

## 2022-08-14 ENCOUNTER — Other Ambulatory Visit (HOSPITAL_COMMUNITY): Payer: Self-pay

## 2022-08-14 NOTE — Telephone Encounter (Signed)
Patient Advocate Encounter   Received notification that prior authorization for Emgality 120MG/ML auto-injectors (migraine) is required.   PA submitted on 08/14/2022 Key [id number] Status is pending       Lyndel Safe, [place] Patient Advocate Specialist [place] Patient Advocate Team Direct Number: [phone]  Fax: [phone]

## 2022-08-16 NOTE — Telephone Encounter (Signed)
Patient Advocate Encounter  Received notification  that the request for prior authorization for Emgality '120MG'$ /ML auto-injectors (migraine) has been denied due to not meeting the prior authorization requirement(s). Medication authorization requires the following: (1) The drug will not be used in combination with another calcitonin gene related peptide inhibitor for the preventive treatment of migraines..     Please be advised we currently do not have a Pharmacist to review denials, therefore you will need to process appeals accordingly as needed. Thanks for your support at this time.     You may call [phone] to appeal.  Lyndel Safe, CPhT Pharmacy Patient Advocate Specialist [place] Patient Advocate Team Direct Number: [phone]  Fax: [phone]

## 2022-08-19 ENCOUNTER — Ambulatory Visit: Payer: 59 | Attending: Medical | Admitting: Medical

## 2022-08-19 ENCOUNTER — Encounter: Payer: Self-pay | Admitting: Medical

## 2022-08-19 VITALS — BP 158/90 | HR 80 | Ht 62.0 in | Wt 196.0 lb

## 2022-08-19 DIAGNOSIS — I5032 Chronic diastolic (congestive) heart failure: Secondary | ICD-10-CM

## 2022-08-19 DIAGNOSIS — E782 Mixed hyperlipidemia: Secondary | ICD-10-CM

## 2022-08-19 DIAGNOSIS — R002 Palpitations: Secondary | ICD-10-CM

## 2022-08-19 DIAGNOSIS — R0602 Shortness of breath: Secondary | ICD-10-CM

## 2022-08-19 DIAGNOSIS — I10 Essential (primary) hypertension: Secondary | ICD-10-CM

## 2022-08-19 MED ORDER — HYDRALAZINE HCL 100 MG PO TABS: 100.0000 mg | ORAL_TABLET | Freq: Two times a day (BID) | ORAL | 3 refills | Status: DC

## 2022-08-19 NOTE — Progress Notes (Signed)
Cardiology Office Note:    Date:  08/19/2022   ID:  [name], DOB 1945/12/24, MRN [id number]  PCP:  Donna Squibb, MD  [name] Cardiologist:  Donna Dolly, MD  [name] Electrophysiologist:  None   Referring MD: Donna Squibb, MD   Chief Complaint: 3 month follow-up  History of Present Illness:    [name] is a 77 y.o. female with a hx of HFpEF, lymphedema, palpitations, HTN, HLD who presents for 3 month follow-up.   H/o chronic diastolic heart failure. Echo 2016 showed LVEF 60-65%, abnormal diastolic function. Echo 12/2020 showed LVEF 60-65%, indeterminate diastolic function. Patient was previously on lasix.   H/o fatigue on atenolol. Intolerance to Losartan with tongue swelling.   Last seen 04/2022 and was overall doing well from a cardiac perspective.  Blood pressure was mildly elevated.  Today, the patient reports trouble breathing for the last few days. Feels short of breath at rest and on exertion. No orthopnea or pnd.  She reports rare chest pain, it's a pressure, not worse with exertion. No recent illnesses. BP is a little high. She takes lasix '40mg'$  daily. She ports she had blood work done by her primary care January 2024.  Past Medical History:  Diagnosis Date   Adenomatous colon polyp 2006   excised in 2006 & 2010Due surveillance 06/2013   Anxiety    Anxiety and depression    Asthma    Breast mass    right nipple bengn mass per patient   Breast tumor    Chest discomfort    Chronic back pain    Chronic constipation    Complication of anesthesia    Depression    Diverticulosis    Fibromyalgia    Gastroesophageal reflux disease    Hiatal hernia    History of benign esophageal tumor 2010   granular cell esophageal tumor (Dx 06/2008), resected via EMR 2011, due repeat EGD 02/2012   Hyperlipidemia    Hypertension    Insomnia    Migraines    PONV (postoperative nausea and vomiting)    Psychosis ([place])    Sleep apnea    Stop Bang score of  5. Pt said she was told by Dr. Merlene Laughter that she had "a little bit" of sleep apena, but not bad enough to treat.   Thyroid nodule    Tumor of esophagus     Past Surgical History:  Procedure Laterality Date   ABDOMINAL HYSTERECTOMY     BALLOON DILATION  08/23/2021   Procedure: BALLOON DILATION;  Surgeon: Donna Harman, DO;  [facility];  Service: Endoscopy;;   BIOPSY  10/14/2019   Procedure: BIOPSY;  Surgeon: Daneil Dolin, MD;  [facility];  Service: Endoscopy;;   BRAVO Olivia STUDY  12/11/2011   Procedure: BRAVO [place];  Surgeon: Daneil Dolin, MD;  [facility];  Service: Endoscopy;  Laterality: N/A;   BREAST EXCISIONAL BIOPSY  1990s, 2012   Left x2-sclerosing ductal papilloma-2012   CHOLECYSTECTOMY N/A 04/09/2013   Procedure: LAPAROSCOPIC CHOLECYSTECTOMY;  Surgeon: Jamesetta So, MD;  Location: [facility];  Service: General;  Laterality: N/A;   COLONOSCOPY  06/2008, 06/2011   sigmoid tics, tubular adenoma; 2013: anal canal hemorrhoids   COLONOSCOPY  07/10/2011   Anal canal hemorrhoids likely the cause of hematochezia in the setting of constipation; otherwise normal rectum ;submucosal  petechiae in left colon of doubtful clinical significance; otherwise, normal colon   COLONOSCOPY WITH  PROPOFOL N/A 11/10/2017   cancelled in pre-op   COLONOSCOPY WITH PROPOFOL N/A 07/23/2018   Procedure: COLONOSCOPY WITH PROPOFOL;  Surgeon: Daneil Dolin, MD; 1 tubular adenoma, diverticulosis in the sigmoid and descending colon, nonbleeding internal hemorrhoids.  No recommendations to repeat due to age.   ESOPHAGEAL BRUSHING  08/23/2021   Procedure: ESOPHAGEAL BRUSHING;  Surgeon: Donna Harman, DO;  [facility];  Service: Endoscopy;;   ESOPHAGEAL DILATION N/A 05/08/2015   Procedure: ESOPHAGEAL DILATION;  Surgeon: Daneil Dolin, MD;  Location: [facility];  Service: Endoscopy;  Laterality: N/AVenia Minks 54/56   ESOPHAGOGASTRODUODENOSCOPY  12/11/2011    [id number] Schatzki's ring; otherwise normal/Small hiatal hernia. Antral and bulbar erosions   ESOPHAGOGASTRODUODENOSCOPY (EGD) WITH PROPOFOL N/A 05/08/2015   Dr. Gala Romney: mild erosive reflux esophagitis, non-critical Schatzki's ring s/p dilation. Hiatal hernia.    ESOPHAGOGASTRODUODENOSCOPY (EGD) WITH PROPOFOL N/A 10/14/2019   Procedure: ESOPHAGOGASTRODUODENOSCOPY (EGD) WITH PROPOFOL;  Surgeon: Daneil Dolin, MD;  Nonobstructing Schatzki ring, small hiatal hernia, abnormal gastric mucosa s/p biopsied, normal first and second portion of the duodenum.  Pathology with chronic inactive gastritis, no H. pylori.   ESOPHAGOGASTRODUODENOSCOPY (EGD) WITH PROPOFOL N/A 08/23/2021   Surgeon: Donna Harman, DO;   white speckled mucosa in the esophagus s/p cells obtained for cytology (KOH prep negative), medium-sized hiatal hernia, grade A reflux esophagitis, mild Schatzki's ring s/p dilation, normal examined stomach and duodenum.   EUS  08/2010   Dr [facility] with EGD. Retained food. No recurrent esophageal lesion, bx negative.   IR KYPHO THORACIC WITH BONE BIOPSY  10/17/2020   IR RADIOLOGIST EVAL & MGMT  09/12/2020   LUNG BIOPSY     negative   PARATHYROIDECTOMY  09/2015   [facility].    PARATHYROIDECTOMY     POLYPECTOMY  07/23/2018   Procedure: POLYPECTOMY;  Surgeon: Daneil Dolin, MD;  [facility];  Service: Endoscopy;;  colon   RIGHT OOPHORECTOMY     benign disease   SHOULDER ARTHROSCOPY     Right; bone spurs removed   TONSILLECTOMY     TOTAL KNEE ARTHROPLASTY  2002   Right; previous arthroscopic surgery    Current Medications: Current Meds  Medication Sig   acetaminophen (TYLENOL) 500 MG tablet Take 2 tablets (1,000 mg total) by mouth every 8 (eight) hours as needed for mild pain, moderate pain or fever.   albuterol (PROVENTIL HFA;VENTOLIN HFA) 108 (90 BASE) MCG/ACT inhaler Inhale 2 puffs into the lungs every 6 (six) hours as needed for wheezing or shortness of breath.     amLODipine (NORVASC) 10 MG tablet Take 10 mg by mouth daily.   Atogepant (QULIPTA) 30 MG TABS Take 30 mg by mouth daily.   azelastine (ASTELIN) 0.1 % nasal spray in the morning and at bedtime.   Azelastine-Fluticasone 137-50 MCG/ACT SUSP Spray 1 spray twice a day by intranasal route.   BELSOMRA 10 MG TABS Take 1 tablet by mouth at bedtime.   Calcium Carbonate-Vit D-Min (CALCIUM 1200 PO) Take by mouth daily.   clobetasol ointment (TEMOVATE) 0.05 % APPLY A THIN LAYER TO THE AFFECTED AREA(S) BY TOPICAL ROUTE 2 TIMES PER DAY   famotidine (PEPCID) 20 MG tablet TAKE TWO TABLETS ('40MG'$  TOTAL) BY MOUTH AT BEDTIME   furosemide (LASIX) 40 MG tablet Take 1 tablet (40 mg total) by mouth daily.   Galcanezumab-gnlm (EMGALITY) 120 MG/ML SOAJ Inject 1 Pen into the skin every 30 (thirty) days.   Galcanezumab-gnlm (EMGALITY) 120 MG/ML SOAJ Inject 2 Pens  into the skin every 30 (thirty) days. First dose only   hydrALAZINE (APRESOLINE) 100 MG tablet Take 1 tablet (100 mg total) by mouth 2 (two) times daily.   hydrOXYzine (ATARAX) 25 MG tablet Take one tablet by mouth once daily as needed for anxiety   levothyroxine (SYNTHROID) 25 MCG tablet Take 25 mcg by mouth daily.   lovastatin (MEVACOR) 40 MG tablet Take 40 mg by mouth at bedtime.   magic mouthwash w/lidocaine SOLN Take 5 mLs by mouth 2 (two) times daily as needed for mouth pain. Swish in the oral cavity for 1-2 minutes and then spit out the liquid. Do not swallow.   Multiple Vitamins-Minerals (CENTRUM SILVER 50+WOMEN PO) Take by mouth daily.   pantoprazole (PROTONIX) 40 MG tablet TAKE ONE TABLET ('40MG'$  TOTAL) BY MOUTH TWO TIMES DAILY   Plecanatide (TRULANCE) 3 MG TABS Take 3 mg by mouth daily.   potassium chloride SA (KLOR-CON M) 20 MEQ tablet Take 1 tablet (20 mEq total) by mouth daily.   Ubrogepant (UBRELVY) 100 MG TABS Take 100 mg by mouth as needed (for migraines). May repeat a dose in 2 hours if headache persists. Max dose 2 pills in 24 hours   zolpidem  (AMBIEN) 10 MG tablet Take 10 mg by mouth at bedtime as needed.   [DISCONTINUED] hydrALAZINE (APRESOLINE) 50 MG tablet Take 1 tablet (50 mg total) by mouth 2 (two) times daily.     Allergies:   Elavil [amitriptyline], Abilify [aripiprazole], Codeine, Latex, Penicillins, Polyethylene glycol, Spironolactone, Sulfonamide derivatives, Cymbalta [duloxetine hcl], and Remeron [mirtazapine]   Social History   Socioeconomic History   Marital status: Single    Spouse name: Not on file   Number of children: 1   Years of education: Not on file   Highest education level: Not on file  Occupational History   Occupation: disabled    Employer: RETIRED  Tobacco Use   Smoking status: Never   Smokeless tobacco: Never  Vaping Use   Vaping Use: Never used  Substance and Sexual Activity   Alcohol use: No    Alcohol/week: 0.0 standard drinks of alcohol   Drug use: No   Sexual activity: Never  Other Topics Concern   Not on file  Social History Narrative   Not on file   Social Determinants of Health   Financial Resource Strain: Not on file  Food Insecurity: Not on file  Transportation Needs: Not on file  Physical Activity: Not on file  Stress: Not on file  Social Connections: Not on file     Family History: The patient's family history includes Alcohol abuse in her father; Anxiety disorder in her mother; Colon cancer in her paternal aunt and paternal uncle; Dementia in her maternal uncle; Depression in her mother; Heart attack in her mother; Stroke in her father. There is no history of ADD / ADHD, Bipolar disorder, Drug abuse, OCD, Paranoid behavior, Schizophrenia, Seizures, Sexual abuse, or Physical abuse.  ROS:   Please see the history of present illness.     All other systems reviewed and are negative.  EKGs/Labs/Other Studies Reviewed:    The following studies were reviewed today:  Echo 2022  1. Left ventricular ejection fraction, by estimation, is 60 to 65%. The  left ventricle has  normal function. The left ventricle has no regional  wall motion abnormalities. There is mild left ventricular hypertrophy.  Left ventricular diastolic parameters  are indeterminate.   2. Right ventricular systolic function is normal. The right ventricular  size  is normal. Tricuspid regurgitation signal is inadequate for assessing  PA pressure.   3. The mitral valve is grossly normal. Trivial mitral valve  regurgitation.   4. The aortic valve is tricuspid. Aortic valve regurgitation is mild.   5. The inferior vena cava is normal in size with greater than 50%  respiratory variability, suggesting right atrial pressure of 3 mmHg.   EKG:  EKG is  ordered today.  The ekg ordered today demonstrates [place] rhythm with PACs, 80 bpm, LVH, nonspecific T wave changes  Recent Labs: 09/10/2021: ALT 19; Hemoglobin 14.4; Platelets 196; TSH 0.11 03/18/2022: BUN 18; Creatinine, Ser 1.10; Magnesium 2.4; Potassium 4.6; Sodium 138  Recent Lipid Panel    Component Value Date/Time   CHOL 147 06/01/2019 1045   TRIG 79 06/01/2019 1045   HDL 65 06/01/2019 1045   CHOLHDL 2.3 06/01/2019 1045   VLDL 16 06/01/2019 1045   LDLCALC 66 06/01/2019 1045   Physical Exam:    VS:  BP (!) 158/90   Pulse 80   Ht '5\' 2"'$  (1.575 m)   Wt 196 lb (88.9 kg)   SpO2 99%   BMI 35.85 kg/m     Wt Readings from Last 3 Encounters:  08/19/22 196 lb (88.9 kg)  06/10/22 196 lb 12.8 oz (89.3 kg)  05/14/22 192 lb (87.1 kg)     GEN:  Well nourished, well developed in no acute distress HEENT: Normal NECK: No JVD; No carotid bruits LYMPHATICS: No lymphadenopathy CARDIAC: RRR, no murmurs, rubs, gallops RESPIRATORY:  Clear to auscultation without rales, wheezing or rhonchi  ABDOMEN: Soft, non-tender, non-distended MUSCULOSKELETAL:  No edema; No deformity  SKIN: Warm and dry NEUROLOGIC:  Alert and oriented x 3 PSYCHIATRIC:  Normal affect   ASSESSMENT:    1. Chronic heart failure with preserved ejection fraction ([place])   2. SOB  (shortness of breath)   3. Essential hypertension   4. Hyperlipidemia, mixed   5. Palpitations    PLAN:    In order of problems listed above:  HFpEF Patient reports shortness of breath for the last few weeks.  No worsening lower leg edema reported.  On exam, patient is euvolemic.  Prior echo in 2022 showed normal LVEF with mild LVH and indeterminate diastolic parameters.  She is on Lasix 40 mg daily with supplemental potassium.  Will reorder an echocardiogram.  PCP did lab work last month, I will request this.  HTN BP has been mildly elevated the last few readings.  Today blood pressure is 158/90.  Patient takes amlodipine 10 mg daily and hydralazine 50 mg twice daily.  I will increase hydralazine to 100 mg twice daily.  HLD Most recent cholesterol panel showed total cholesterol 134, triglycerides 71, HDL 58, LDL 62.  Continue medical therapy with lovastatin 40 mg daily.  Palpitations She denies any recent symptoms.  She is not on atenolol due to fatigue.  Disposition: Follow up in 1 month(s) with MD/APP    Signed, Jaremy Nosal Ninfa Meeker, PA-C  08/19/2022 1:13 PM    [facility]

## 2022-08-19 NOTE — Patient Instructions (Signed)
Medication Instructions:  Your physician has recommended you make the following change in your medication:  Increase Hydralazine to 100 mg tablet twice dialy  *If you need a refill on your cardiac medications before your next appointment, please call your pharmacy*   Lab Work: None If you have labs (blood work) drawn today and your tests are completely normal, you will receive your results only by: [place] (if you have MyChart) OR A paper copy in the mail If you have any lab test that is abnormal or we need to change your treatment, we will call you to review the results.   Testing/Procedures: Your physician has requested that you have an echocardiogram. Echocardiography is a painless test that uses sound waves to create images of your heart. It provides your doctor with information about the size and shape of your heart and how well your heart's chambers and valves are working. This procedure takes approximately one hour. There are no restrictions for this procedure. Please do NOT wear cologne, perfume, aftershave, or lotions (deodorant is allowed). Please arrive 15 minutes prior to your appointment time.    Follow-Up: At [facility], you and your health needs are our priority.  As part of our continuing mission to provide you with exceptional heart care, we have created designated Provider Care Teams.  These Care Teams include your primary Cardiologist (physician) and Advanced Practice Providers (APPs -  Physician Assistants and Nurse Practitioners) who all work together to provide you with the care you need, when you need it.  We recommend signing up for the patient portal called "MyChart".  Sign up information is provided on this After Visit Summary.  MyChart is used to connect with patients for Virtual Visits (Telemedicine).  Patients are able to view lab/test results, encounter notes, upcoming appointments, etc.  Non-urgent messages can be sent to your provider as  well.   To learn more about what you can do with MyChart, go to [email or web address].    Your next appointment:   1 month(s)  Provider:   You will see one of our Advanced Practice Providers.      Other Instructions

## 2022-08-20 ENCOUNTER — Other Ambulatory Visit (HOSPITAL_COMMUNITY): Payer: Self-pay

## 2022-08-20 NOTE — Telephone Encounter (Signed)
Pt called stated her insurance denied Emgality '120MG'$ /ML auto-injectors. Stated she would like to talk to nurse to see if Dr. Billey Gosling can prescribe something else.

## 2022-08-20 NOTE — Telephone Encounter (Signed)
Hello, we are a little confused about this denial. She is not going to take with another CGRP. She was told to stop [place] and start on Emgality? Are you able to resubmit?

## 2022-08-20 NOTE — Telephone Encounter (Signed)
    Tried to submit a new PA-will not allow-The OV Note that was sent on original PA was dated for the last OV on 05/08/2022 that may have seemed a little confusion-if it would have let me upload the notes from the call with PT they could see what was going on with the meds and changes but would not allow me to proceed with PA.

## 2022-08-21 ENCOUNTER — Encounter: Payer: Self-pay | Admitting: Internal Medicine

## 2022-08-21 NOTE — Telephone Encounter (Signed)
Appeal letter written, waiting on MD signature.

## 2022-08-21 NOTE — Telephone Encounter (Signed)
Can you please upload denial letter under media?

## 2022-08-21 NOTE — Telephone Encounter (Signed)
Denial letter has been scanned into chart under the media tab.

## 2022-08-22 NOTE — Telephone Encounter (Signed)
Faxed appeal letter to [phone]. Marked urgent. Waiting on determination.

## 2022-08-28 NOTE — Telephone Encounter (Signed)
Called [facility] [employer] at [phone]. Spoke w/ Doren Custard. Ref# [id number]. He is unable to check status of appeal. He transferred me to appeal department.  [phone]. Spoke w/ [place]. Appeal approved 08/14/22-06/24/23. Appeal approval #: [id number].  Appeal approved on 08/24/22.   Pt ID: [id number]

## 2022-08-28 NOTE — Telephone Encounter (Signed)
Called pt and informed her that appeal was approved for [facility] and she should be able to pick up from pharmacy.

## 2022-08-29 DIAGNOSIS — L298 Other pruritus: Secondary | ICD-10-CM | POA: Diagnosis not present

## 2022-08-29 DIAGNOSIS — L304 Erythema intertrigo: Secondary | ICD-10-CM | POA: Diagnosis not present

## 2022-09-16 ENCOUNTER — Ambulatory Visit (INDEPENDENT_AMBULATORY_CARE_PROVIDER_SITE_OTHER): Payer: 59 | Admitting: Internal Medicine

## 2022-09-16 ENCOUNTER — Encounter: Payer: Self-pay | Admitting: Internal Medicine

## 2022-09-16 ENCOUNTER — Other Ambulatory Visit: Payer: Self-pay

## 2022-09-16 VITALS — BP 132/70 | HR 100 | Temp 98.0°F | Resp 20 | Ht 62.0 in | Wt 194.6 lb

## 2022-09-16 DIAGNOSIS — L299 Pruritus, unspecified: Secondary | ICD-10-CM | POA: Diagnosis not present

## 2022-09-16 DIAGNOSIS — T783XXA Angioneurotic edema, initial encounter: Secondary | ICD-10-CM

## 2022-09-16 DIAGNOSIS — J310 Chronic rhinitis: Secondary | ICD-10-CM

## 2022-09-16 DIAGNOSIS — R21 Rash and other nonspecific skin eruption: Secondary | ICD-10-CM

## 2022-09-16 MED ORDER — FLUTICASONE PROPIONATE 50 MCG/ACT NA SUSP: 2.0000 | Freq: Every day | NASAL | 5 refills | Status: DC

## 2022-09-16 NOTE — Patient Instructions (Addendum)
Rash/Pruritus  - The facial pruritus is likely related to the rash.   - Follow up with Dermatology. This is not hives/eczema.   - Will also do bloodwork to identify any causes for diffuse pruritus where she doesn't have the rash.  Did discuss dry skin can contribute to pruritus and to follow good skin care as discussed below.   - Do a daily soaking tub bath in warm water for 10-15 minutes.  - Use a gentle, unscented cleanser at the end of the bath (such as Dove unscented bar or baby wash, or Aveeno sensitive body wash). Then rinse, pat half-way dry, and apply a gentle, unscented moisturizer cream or ointment (Cerave, Cetaphil, Eucerin, Aveeno)  all over while still damp. Dry skin makes the itching worse. The skin should be moisturized with a gentle, unscented moisturizer at least twice daily.  - Use only unscented liquid laundry detergent.  Angioedema/Swelling - Keep track of how often this is happening and how long it lasts. - Take pictures of this also.  Chronic Rhinitis - Use nasal saline rinses before nose sprays such as with Neilmed Sinus Rinse.  Use distilled water.   - Use Flonase 2 sprays each nostril daily. Aim upward and outward. - Hold all anti histamines (Zyrtec, Claritin, Allegra, Benadryl, Doxepin) and Azelastine 3 days prior to next visit for skin testing.

## 2022-09-16 NOTE — Progress Notes (Signed)
NEW PATIENT  Date of Service/Encounter:  09/16/22  Consult requested by: Celene Squibb, MD   Subjective:   [name] (DOB: [birth year]) is a 77 y.o. female who presents to the clinic on 09/16/2022 with a chief complaint of Other (Pt states she has been having itchy since march of last year and she has bumps on face and neck.pt states swelling in mouth) .    History obtained from: chart review and patient. She is not the best historian so a lot of the history was obtained from chart review.  Rash/Pruritus: Reports being in hospital in March and since then has been breaking out in itchy rash on her face.  Also feels itchy all over where she doesn't have a rash.  Reports they are pinpoint and has seen Dr. Magda Kiel [facility]at[facility] recently a few weeks ago who started her on Doxepin PRN and creams but has not had resolution.  Does report the itching is better but not resolved.  Unclear what cream this is.  Denies any issues with hives or eczema.  PCP visit notes having trouble with diffuse itching despite use of anti-histamines with Pepcid/Zyrtec.  Also referred back for dermatology evaluation as she has a history of nummular eczema and was on Clobetasol in the past.   Swelling: Reports having trouble with tongue swelling, entire tongue, not unilateral.  Usually lasts a day but sometimes 2-3 days. No issues with trouble breathing/trouble swallowing. Does not swell anywhere else.  Not sure when it started but does report it has been a lot better after having a swallow evaluation with GI.  Chart review shows she was on lisinopril a long time ago around 2017 and that was then changed to losartan around 2017/2018.  Also follows with cardiology who stopped the losartan due to the tongue swelling and switched it to hydralazine recently in the past month.  No hives with it No clear triggers. No family history of swelling episodes.   Rhinitis:  Started years ago.  Symptoms include: nasal congestion,  post nasal drainage, and sneezing  Occurs year-round Potential triggers: not sure Treatments tried:  Flonase/Azelastine daily Zyrtec daily previously AIT in 1980s  Previous allergy testing: yes years ago but can't recall results  History of sinus surgery: no Nonallergic triggers: none    GERD/Trouble Swallowing: Followed by GI. EGD3/2023 with grade A esophagitis, hiatal hernia, Schatzki ring which was dilated. Takes protonix twice daily and reports doing better with this. Not much reflux, heartburn, sour taste.  They have discussed plans for speech therapy.     Past Medical History: Past Medical History:  Diagnosis Date   Adenomatous colon polyp 2006   excised in 2006 & 2010Due surveillance 06/2013   Anxiety    Anxiety and depression    Asthma    Breast mass    right nipple bengn mass per patient   Breast tumor    Chest discomfort    Chronic back pain    Chronic constipation    Complication of anesthesia    Depression    Diverticulosis    Fibromyalgia    Gastroesophageal reflux disease    Hiatal hernia    History of benign esophageal tumor 2010   granular cell esophageal tumor (Dx 06/2008), resected via EMR 2011, due repeat EGD 02/2012   Hyperlipidemia    Hypertension    Insomnia    Migraines    PONV (postoperative nausea and vomiting)    Psychosis ([place])    Sleep apnea  Stop Bang score of 5. Pt said she was told by Dr. Merlene Laughter that she had "a little bit" of sleep apena, but not bad enough to treat.   Thyroid nodule    Tumor of esophagus    Past Surgical History: Past Surgical History:  Procedure Laterality Date   ABDOMINAL HYSTERECTOMY     BALLOON DILATION  08/23/2021   Procedure: BALLOON DILATION;  Surgeon: Eloise Harman, DO;  [facility];  Service: Endoscopy;;   BIOPSY  10/14/2019   Procedure: BIOPSY;  Surgeon: Daneil Dolin, MD;  [facility];  Service: Endoscopy;;   BRAVO [place] STUDY  12/11/2011   Procedure: BRAVO [place];   Surgeon: Daneil Dolin, MD;  [facility];  Service: Endoscopy;  Laterality: N/A;   BREAST EXCISIONAL BIOPSY  1990s, 2012   Left x2-sclerosing ductal papilloma-2012   CHOLECYSTECTOMY N/A 04/09/2013   Procedure: LAPAROSCOPIC CHOLECYSTECTOMY;  Surgeon: Jamesetta So, MD;  Location: [facility];  Service: General;  Laterality: N/A;   COLONOSCOPY  06/2008, 06/2011   sigmoid tics, tubular adenoma; 2013: anal canal hemorrhoids   COLONOSCOPY  07/10/2011   Anal canal hemorrhoids likely the cause of hematochezia in the setting of constipation; otherwise normal rectum ;submucosal  petechiae in left colon of doubtful clinical significance; otherwise, normal colon   COLONOSCOPY WITH PROPOFOL N/A 11/10/2017   cancelled in pre-op   COLONOSCOPY WITH PROPOFOL N/A 07/23/2018   Procedure: COLONOSCOPY WITH PROPOFOL;  Surgeon: Daneil Dolin, MD; 1 tubular adenoma, diverticulosis in the sigmoid and descending colon, nonbleeding internal hemorrhoids.  No recommendations to repeat due to age.   ESOPHAGEAL BRUSHING  08/23/2021   Procedure: ESOPHAGEAL BRUSHING;  Surgeon: Eloise Harman, DO;  [facility];  Service: Endoscopy;;   ESOPHAGEAL DILATION N/A 05/08/2015   Procedure: ESOPHAGEAL DILATION;  Surgeon: Daneil Dolin, MD;  Location: [facility];  Service: Endoscopy;  Laterality: N/AVenia Minks 54/56   ESOPHAGOGASTRODUODENOSCOPY  12/11/2011   [id number] Schatzki's ring; otherwise normal/Small hiatal hernia. Antral and bulbar erosions   ESOPHAGOGASTRODUODENOSCOPY (EGD) WITH PROPOFOL N/A 05/08/2015   Dr. Gala Romney: mild erosive reflux esophagitis, non-critical Schatzki's ring s/p dilation. Hiatal hernia.    ESOPHAGOGASTRODUODENOSCOPY (EGD) WITH PROPOFOL N/A 10/14/2019   Procedure: ESOPHAGOGASTRODUODENOSCOPY (EGD) WITH PROPOFOL;  Surgeon: Daneil Dolin, MD;  Nonobstructing Schatzki ring, small hiatal hernia, abnormal gastric mucosa s/p biopsied, normal first and second portion of the duodenum.   Pathology with chronic inactive gastritis, no H. pylori.   ESOPHAGOGASTRODUODENOSCOPY (EGD) WITH PROPOFOL N/A 08/23/2021   Surgeon: Eloise Harman, DO;   white speckled mucosa in the esophagus s/p cells obtained for cytology (KOH prep negative), medium-sized hiatal hernia, grade A reflux esophagitis, mild Schatzki's ring s/p dilation, normal examined stomach and duodenum.   EUS  08/2010   Dr [facility] with EGD. Retained food. No recurrent esophageal lesion, bx negative.   IR KYPHO THORACIC WITH BONE BIOPSY  10/17/2020   IR RADIOLOGIST EVAL & MGMT  09/12/2020   LUNG BIOPSY     negative   PARATHYROIDECTOMY  09/2015   [facility].    PARATHYROIDECTOMY     POLYPECTOMY  07/23/2018   Procedure: POLYPECTOMY;  Surgeon: Daneil Dolin, MD;  [facility];  Service: Endoscopy;;  colon   RIGHT OOPHORECTOMY     benign disease   SHOULDER ARTHROSCOPY     Right; bone spurs removed   TONSILLECTOMY     TOTAL KNEE ARTHROPLASTY  2002   Right; previous  arthroscopic surgery    Family History: Family History  Problem Relation Age of Onset   Stroke Father    Alcohol abuse Father    Heart attack Mother    Depression Mother    Anxiety disorder Mother    Colon cancer Paternal Aunt    Colon cancer Paternal Uncle    Dementia Maternal Uncle    ADD / ADHD Neg Hx    Bipolar disorder Neg Hx    Drug abuse Neg Hx    OCD Neg Hx    Paranoid behavior Neg Hx    Schizophrenia Neg Hx    Seizures Neg Hx    Sexual abuse Neg Hx    Physical abuse Neg Hx     Social History:  Lives in a 24 year apartment Flooring in bedroom: carpet Pets: none Tobacco use/exposure: none Job: retired  Medication List:  Allergies as of 09/16/2022       Reactions   Elavil [amitriptyline] Other (See Comments)   Felt really nervous and felt like something was hold her feet or arm and/ or AM headache on it and back on it and went away off it.    Abilify [aripiprazole] Other (See Comments)   Dystonic reaction   Codeine  Hives, Nausea Only, Other (See Comments)   Feels funny   Latex Hives   Penicillins Hives, Itching, Other (See Comments)   Has patient had a PCN reaction causing immediate rash, facial/tongue/throat swelling, SOB or lightheadedness with hypotension: Yes Has patient had a PCN reaction causing severe rash involving mucus membranes or skin necrosis: No Has patient had a PCN reaction that required hospitalization No Has patient had a PCN reaction occurring within the last 10 years: No If all of the above answers are "NO", then may proceed with Cephalosporin use.   Polyethylene Glycol Other (See Comments)   Per allergy test   Spironolactone    Hyperkalemia   Sulfonamide Derivatives Nausea And Vomiting   Cymbalta [duloxetine Hcl] Rash   Remeron [mirtazapine] Other (See Comments)   Caused lots of strange, weird, crazy dreams.        Medication List        Accurate as of September 16, 2022 10:40 AM. If you have any questions, ask your nurse or doctor.          acetaminophen 500 MG tablet Commonly known as: TYLENOL Take 2 tablets (1,000 mg total) by mouth every 8 (eight) hours as needed for mild pain, moderate pain or fever.   albuterol 108 (90 Base) MCG/ACT inhaler Commonly known as: VENTOLIN HFA Inhale 2 puffs into the lungs every 6 (six) hours as needed for wheezing or shortness of breath.   amLODipine 10 MG tablet Commonly known as: NORVASC Take 10 mg by mouth daily.   azelastine 0.1 % nasal spray Commonly known as: ASTELIN in the morning and at bedtime.   Azelastine-Fluticasone 137-50 MCG/ACT Susp Spray 1 spray twice a day by intranasal route.   Belsomra 10 MG Tabs Generic drug: Suvorexant Take 1 tablet by mouth at bedtime.   CALCIUM 1200 PO Take by mouth daily.   CENTRUM SILVER 50+WOMEN PO Take by mouth daily.   clobetasol ointment 0.05 % Commonly known as: TEMOVATE APPLY A THIN LAYER TO THE AFFECTED AREA(S) BY TOPICAL ROUTE 2 TIMES PER DAY   Emgality 120 MG/ML  Soaj Generic drug: Galcanezumab-gnlm Inject 1 Pen into the skin every 30 (thirty) days.   Emgality 120 MG/ML Soaj Generic drug: Galcanezumab-gnlm Inject 2 Pens  into the skin every 30 (thirty) days. First dose only   famotidine 20 MG tablet Commonly known as: PEPCID TAKE TWO TABLETS (40MG  TOTAL) BY MOUTH AT BEDTIME   furosemide 40 MG tablet Commonly known as: LASIX Take 1 tablet (40 mg total) by mouth daily.   hydrALAZINE 100 MG tablet Commonly known as: APRESOLINE Take 1 tablet (100 mg total) by mouth 2 (two) times daily.   hydrOXYzine 25 MG tablet Commonly known as: ATARAX Take one tablet by mouth once daily as needed for anxiety   levothyroxine 25 MCG tablet Commonly known as: SYNTHROID Take 25 mcg by mouth daily.   lovastatin 40 MG tablet Commonly known as: MEVACOR Take 40 mg by mouth at bedtime.   magic mouthwash w/lidocaine Soln Take 5 mLs by mouth 2 (two) times daily as needed for mouth pain. Swish in the oral cavity for 1-2 minutes and then spit out the liquid. Do not swallow.   pantoprazole 40 MG tablet Commonly known as: PROTONIX TAKE ONE TABLET (40MG  TOTAL) BY MOUTH TWO TIMES DAILY   potassium chloride SA 20 MEQ tablet Commonly known as: KLOR-CON M Take 1 tablet (20 mEq total) by mouth daily.   Qulipta 30 MG Tabs Generic drug: Atogepant Take 30 mg by mouth daily.   Trulance 3 MG Tabs Generic drug: Plecanatide Take 3 mg by mouth daily.   Ubrelvy 100 MG Tabs Generic drug: Ubrogepant Take 100 mg by mouth as needed (for migraines). May repeat a dose in 2 hours if headache persists. Max dose 2 pills in 24 hours   zolpidem 10 MG tablet Commonly known as: AMBIEN Take 10 mg by mouth at bedtime as needed.         REVIEW OF SYSTEMS: Pertinent positives and negatives discussed in HPI.   Objective:   Physical Exam: BP 132/70   Pulse 100   Temp 98 F (36.7 C)   Resp 20   Ht 5\' 2"  (1.575 m)   Wt 194 lb 9.6 oz (88.3 kg)   SpO2 97%   BMI 35.59  kg/m  Body mass index is 35.59 kg/m. GEN: alert, well developed HEENT: clear conjunctiva, TM grey and translucent, nose with + inferior turbinate hypertrophy, pinknasal mucosa, slight clear rhinorrhea, + cobblestoning HEART: regular rate and rhythm, no murmur LUNGS: clear to auscultation bilaterally, no coughing, unlabored respiration ABDOMEN: soft, non distended  SKIN: pinpoint lesions, some like white heads scattered on neck and face  Reviewed:  Visits with Cardiology/GI/PCP reviewed.  07/24/2022: normal CMP  Assessment:   1. Angioedema, initial encounter   2. Pruritus   3. Rash and nonspecific skin eruption   4. Chronic rhinitis     Plan/Recommendations:  Rash/Pruritus  - The facial pruritus is likely related to the rash.   - Follow up with Dermatology. This is not hives/eczema.   - Will also do bloodwork to identify any causes for diffuse pruritus where she doesn't have the rash.  Did discuss dry skin can contribute to pruritus and to follow good skin care as discussed below.   - Do a daily soaking tub bath in warm water for 10-15 minutes.  - Use a gentle, unscented cleanser at the end of the bath (such as Dove unscented bar or baby wash, or Aveeno sensitive body wash). Then rinse, pat half-way dry, and apply a gentle, unscented moisturizer cream or ointment (Cerave, Cetaphil, Eucerin, Aveeno)  all over while still damp. Dry skin makes the itching worse. The skin should be moisturized with a  gentle, unscented moisturizer at least twice daily.  - Use only unscented liquid laundry detergent.  Angioedema/Swelling- Tongue - Unclear etiology, no hives.  Sometimes histaminergic angioedema can happen without hives.  Was on ARB which was stopped recently, although this is rarely know to cause recurrent angioedema.  No recent ACEI use.  - Keep track of how often this is happening and how long it lasts. - Take pictures of this also. - Will obtain labwork; forms printed and to be given  Wednesday.   Chronic Rhinitis - Due to turbinate hypertrophy, seasonal symptoms and unresponsive to OTC meds, performed skin testing to identify aeroallergen triggers.   - Use nasal saline rinses before nose sprays such as with Neilmed Sinus Rinse.  Use distilled water.   - Use Flonase 2 sprays each nostril daily. Aim upward and outward. - Hold all anti histamines and Azelastine 3 days prior to next visit for skin testing.       Return in about 1 week (around 09/23/2022).  Harlon Flor, MD [facility]

## 2022-09-17 ENCOUNTER — Ambulatory Visit (INDEPENDENT_AMBULATORY_CARE_PROVIDER_SITE_OTHER): Payer: 59 | Admitting: Student

## 2022-09-17 ENCOUNTER — Ambulatory Visit (HOSPITAL_COMMUNITY)
Admission: RE | Admit: 2022-09-17 | Discharge: 2022-09-17 | Disposition: A | Discharge status: Home / Self Care | Payer: 59 | Source: Ambulatory Visit | Attending: Medical | Admitting: Medical

## 2022-09-17 ENCOUNTER — Encounter: Payer: Self-pay | Admitting: Student

## 2022-09-17 VITALS — BP 152/88 | HR 99 | Ht 62.0 in | Wt 194.4 lb

## 2022-09-17 DIAGNOSIS — R0609 Other forms of dyspnea: Secondary | ICD-10-CM | POA: Diagnosis not present

## 2022-09-17 DIAGNOSIS — I5032 Chronic diastolic (congestive) heart failure: Secondary | ICD-10-CM | POA: Diagnosis not present

## 2022-09-17 DIAGNOSIS — I10 Essential (primary) hypertension: Secondary | ICD-10-CM | POA: Diagnosis not present

## 2022-09-17 DIAGNOSIS — E782 Mixed hyperlipidemia: Secondary | ICD-10-CM

## 2022-09-17 DIAGNOSIS — R0602 Shortness of breath: Secondary | ICD-10-CM | POA: Insufficient documentation

## 2022-09-17 DIAGNOSIS — I340 Nonrheumatic mitral (valve) insufficiency: Secondary | ICD-10-CM | POA: Diagnosis not present

## 2022-09-17 DIAGNOSIS — Z952 Presence of prosthetic heart valve: Secondary | ICD-10-CM | POA: Diagnosis not present

## 2022-09-17 DIAGNOSIS — Z8673 Personal history of transient ischemic attack (TIA), and cerebral infarction without residual deficits: Secondary | ICD-10-CM | POA: Diagnosis not present

## 2022-09-17 LAB — ECHOCARDIOGRAM COMPLETE
Area-P 1/2: 3.78 cm2
S' Lateral: 2.6 cm

## 2022-09-17 MED ORDER — HYDRALAZINE HCL 100 MG PO TABS: 100.0000 mg | ORAL_TABLET | Freq: Three times a day (TID) | ORAL | 1 refills | Status: DC

## 2022-09-17 NOTE — Progress Notes (Signed)
*  PRELIMINARY RESULTS* Echocardiogram 2D Echocardiogram has been performed.  [name] 09/17/2022, 9:30 AM

## 2022-09-17 NOTE — Patient Instructions (Addendum)
Medication Instructions:  INCREASE Hydralazine 100 mg to THREE times a day  Labwork: BNP at [facility]  Testing/Procedures: None today  Follow-Up: 3 months  Any Other Special Instructions Will Be Listed Below (If Applicable).  Keep blood pressure,heart rate log for 2 weeks. Drop off at office.  If you need a refill on your cardiac medications before your next appointment, please call your pharmacy.

## 2022-09-17 NOTE — Progress Notes (Addendum)
Cardiology Office Note:    Date:  09/17/2022  ID:  [name], DOB March 25, 1946, MRN [id number]  History of Present Illness:    [name] is a 77 y.o. female with past medical history of HFpEF, palpitations, HTN and HLD who presents to the office today for 38-month follow-up.   She was examined by Cadence Furth, PA-C in 07/2022 and reported worsening dyspnea at rest and with activity for the past few days. Her weight was stable at 196 lbs and she appeared euvolemic on examination. She was continued on Lasix 40 mg daily and a follow-up echocardiogram was recommended prior to next visit. BP was elevated and Hydralazine was increased from 50 mg twice daily to 100 mg twice daily. A follow-up echocardiogram was recommended. This was obtained earlier today and showed a preserved EF of 60 to 65% with no regional abnormalities. She did have mild LVH, normal RV function, normal PASP, mild MR and mild AI.  In talking with the patient and her daughter today, she reports still having dyspnea on exertion which has been present since her last visit. Says that blood pressure has remained elevated and not significantly improved with dose adjustment of Hydralazine at the time of her last visit.  SBP has been in the 150's to 160's when checked at home and diastolic readings have been in the 90's. She denies any associated chest pain or palpitations. No specific orthopnea, PND or pitting edema. Says that her weight has been stable on her home scales.   Studies Reviewed:    EKG:  EKG from 08/19/2022 is reviewed and shows NSR, HR 80 with PAC's and slight TWI along inferior leads which is similar to prior tracings.   Echocardiogram: 12/2020 IMPRESSIONS     1. Left ventricular ejection fraction, by estimation, is 60 to 65%. The  left ventricle has normal function. The left ventricle has no regional  wall motion abnormalities. There is mild left ventricular hypertrophy.  Left ventricular diastolic parameters   are indeterminate.   2. Right ventricular systolic function is normal. The right ventricular  size is normal. Tricuspid regurgitation signal is inadequate for assessing  PA pressure.   3. The mitral valve is grossly normal. Trivial mitral valve  regurgitation.   4. The aortic valve is tricuspid. Aortic valve regurgitation is mild.   5. The inferior vena cava is normal in size with greater than 50%  respiratory variability, suggesting right atrial pressure of 3 mmHg.   Echocardiogram: 08/2022 IMPRESSIONS     1. Left ventricular ejection fraction, by estimation, is 60 to 65%. The  left ventricle has normal function. The left ventricle has no regional  wall motion abnormalities. There is mild concentric left ventricular  hypertrophy. Left ventricular diastolic  parameters are indeterminate.   2. Right ventricular systolic function is normal. The right ventricular  size is normal. There is normal pulmonary artery systolic pressure. The  estimated right ventricular systolic pressure is 0000000 mmHg.   3. Left atrial size was mildly dilated.   4. The mitral valve is grossly normal. Mild mitral valve regurgitation.   5. The aortic valve is tricuspid. There is mild calcification of the  aortic valve. Aortic valve regurgitation is mild.   6. The inferior vena cava is normal in size with greater than 50%  respiratory variability, suggesting right atrial pressure of 3 mmHg.   Comparison(s): Prior images reviewed side by side. LVEF stable at 60-65%.  Mild aortic regurgitation as before.  Physical Exam:   VS:  There were no vitals taken for this visit.   Wt Readings from Last 3 Encounters:  09/16/22 194 lb 9.6 oz (88.3 kg)  08/19/22 196 lb (88.9 kg)  06/10/22 196 lb 12.8 oz (89.3 kg)     GEN: Pleasant female appearing in no acute distress NECK: No JVD; No carotid bruits CARDIAC: RRR, no murmurs, rubs, gallops RESPIRATORY:  Clear to auscultation without rales, wheezing or rhonchi   ABDOMEN: No obvious masses. Does not appear distended.  EXTREMITIES:  Trace ankle edema bilaterally; No deformity   ASSESSMENT AND PLAN:    1. HFpEF/Dyspnea on Exertion - Echocardiogram earlier today was reassuring as outlined above. Will initially focus on better control of her blood pressure to see if this helps with symptoms. Will have her keep a BP log as outlined below. Will also check a CBC and BNP. If she continues to have dyspnea on exertion, can obtain a Lexiscan Myoview to rule out an ischemic etiology.  2. HTN - Her blood pressure was initially recorded at 154/98, rechecked and at 152/88. This has been elevated when checked at home. She is currently taking Amlodipine 10 mg daily and Hydralazine 100 mg twice daily. Will have her increase Hydralazine to 100 mg 3 times daily and follow readings at home. BP log provided. If BP remains above goal, would consider the addition of Chlorthalidone as options are limited. She previously had tongue swelling with Losartan which limits the use of ACE's and ARB's and had fatigue with beta-blockers. Also had hyperkalemia with Spironolactone.   3. HLD - Followed by her PCP.  LDL was 87 and 06/2022.  She remains on Lovastatin 40 mg daily.   Signed, Erma Heritage, PA-C

## 2022-09-18 ENCOUNTER — Other Ambulatory Visit: Payer: Self-pay

## 2022-09-18 ENCOUNTER — Encounter: Payer: Self-pay | Admitting: Internal Medicine

## 2022-09-18 ENCOUNTER — Ambulatory Visit (INDEPENDENT_AMBULATORY_CARE_PROVIDER_SITE_OTHER): Payer: 59 | Admitting: Internal Medicine

## 2022-09-18 VITALS — BP 160/80 | HR 101 | Temp 98.4°F | Resp 16

## 2022-09-18 DIAGNOSIS — J3089 Other allergic rhinitis: Secondary | ICD-10-CM | POA: Diagnosis not present

## 2022-09-18 DIAGNOSIS — L299 Pruritus, unspecified: Secondary | ICD-10-CM | POA: Diagnosis not present

## 2022-09-18 MED ORDER — CETIRIZINE HCL 10 MG PO TABS: 10.0000 mg | ORAL_TABLET | Freq: Every day | ORAL | 5 refills | Status: DC | PRN

## 2022-09-18 NOTE — Progress Notes (Signed)
FOLLOW UP Date of Service/Encounter:  09/18/22   Subjective:  [name] (DOB: 09-Apr-1946) is a 77 y.o. female who returns to the [facility] [place] on 09/18/2022 for follow up for skin testing.   History obtained from: chart review and patient. Doing the same since last visit, has held anti histamines for the test Still itching and also has the rash on her face.   Has noticed some itchy watery eyes today also.   Past Medical History: Past Medical History:  Diagnosis Date   Adenomatous colon polyp 2006   excised in 2006 & 2010Due surveillance 06/2013   Anxiety    Anxiety and depression    Asthma    Breast mass    right nipple bengn mass per patient   Breast tumor    Chest discomfort    Chronic back pain    Chronic constipation    Complication of anesthesia    Depression    Diverticulosis    Fibromyalgia    Gastroesophageal reflux disease    Hiatal hernia    History of benign esophageal tumor 2010   granular cell esophageal tumor (Dx 06/2008), resected via EMR 2011, due repeat EGD 02/2012   Hyperlipidemia    Hypertension    Insomnia    Migraines    PONV (postoperative nausea and vomiting)    Psychosis ([place])    Sleep apnea    Stop Bang score of 5. Pt said she was told by Dr. Merlene Laughter that she had "a little bit" of sleep apena, but not bad enough to treat.   Thyroid nodule    Tumor of esophagus     Objective:  BP (!) 160/80   Pulse (!) 101   Temp 98.4 F (36.9 C)   Resp 16   SpO2 97%  There is no height or weight on file to calculate BMI. Physical Exam: GEN: alert, well developed HEENT: clear conjunctiva, MMM HEART: regular rate  LUNGS: no coughing, unlabored respiration  Skin Testing:  Skin prick testing was placed, which includes aeroallergens/foods, histamine control, and saline control.  Verbal consent was obtained prior to placing test.  Patient tolerated procedure well.  Allergy testing results were read and interpreted by myself,  documented by clinical staff. Adequate positive and negative control.  Positive results to:  Results discussed with patient/family.  Airborne Adult Perc - 09/18/22 1044     Time Antigen Placed 1044    Allergen Manufacturer Greer    Location Back    Number of Test 59              Assessment:   1. Other allergic rhinitis   2. Pruritus     Plan/Recommendations:  Rash/Pruritus  - The facial pruritus is likely related to the rash.   - Follow up with Dermatology. This is not hives/eczema.   - SPT was negative and did not identify any aeroallergen triggers.  Will also do bloodwork to identify any causes for diffuse pruritus where she doesn't have the rash.  Normal CMP recently in 06/2022 under lab report scan.  Did discuss dry skin can contribute to pruritus and to follow good skin care as discussed below.   - Do a daily soaking tub bath in warm water for 10-15 minutes.  - Use a gentle, unscented cleanser at the end of the bath (such as Dove unscented bar or baby wash, or Aveeno sensitive body wash). Then rinse, pat half-way dry, and apply a gentle, unscented moisturizer cream or  ointment (Cerave, Cetaphil, Eucerin, Aveeno)  all over while still damp. Dry skin makes the itching worse. The skin should be moisturized with a gentle, unscented moisturizer at least twice daily.  - Use only unscented liquid laundry detergent. - You can also try Sarna anti-itch lotion as needed.    Angioedema/Swelling - Keep track of how often this is happening and how long it lasts. - Take pictures of this also. - Please get labwork done to identify any causes.    Chronic Rhinitis: - Due to turbinate hypertrophy and unresponsive to OTC meds, performed skin testing to identify aeroallergen triggers.   - Positive skin test 08/2022: none - Avoidance measures discussed. - Use nasal saline rinses before nose sprays such as with Neilmed Sinus Rinse.  Use distilled water.   - Use Flonase 2 sprays each nostril  daily. Aim upward and outward. - Use Azelastine 1-2 sprays each nostril twice daily as needed for runny nose, congestion, drainage, sneezing. Aim upward and outward. - Use Zyrtec 10 mg daily as need for runny nose, sneezing, itchy watery eyes.      Return in about 3 months (around 12/19/2022).  Harlon Flor, MD [facility]

## 2022-09-18 NOTE — Patient Instructions (Addendum)
Rash/Pruritus (Itching) - The facial pruritus is likely related to the rash.   - Follow up with Dermatology. This is not hives/eczema.   - Will also do bloodwork to identify any causes for diffuse pruritus where she doesn't have the rash.  - Do a daily soaking tub bath in warm water for 10-15 minutes.  - Use a gentle, unscented cleanser at the end of the bath (such as Dove unscented bar or baby wash, or Aveeno sensitive body wash). Then rinse, pat half-way dry, and apply a gentle, unscented moisturizer cream or ointment (Cerave, Cetaphil, Eucerin, Aveeno)  all over while still damp. Dry skin makes the itching worse. The skin should be moisturized with a gentle, unscented moisturizer at least twice daily.  - Use only unscented liquid laundry detergent. - You can also try Sarna anti-itch lotion as needed.    Angioedema (Swelling) - Keep track of how often this is happening and how long it lasts. - Take pictures of this also. - Please get labwork done to identify any causes.    Chronic Rhinitis: - Positive skin test 08/2022: none - Use nasal saline rinses before nose sprays such as with Neilmed Sinus Rinse.  Use distilled water.   - Use Flonase 2 sprays each nostril daily. Aim upward and outward. - Use Azelastine 1-2 sprays each nostril twice daily as needed for runny nose, congestion, drainage, sneezing. Aim upward and outward. - Use Zyrtec 10 mg daily as need for runny nose, sneezing, itchy watery eyes.

## 2022-09-19 ENCOUNTER — Encounter: Payer: Self-pay | Admitting: Orthopedic Surgery

## 2022-09-19 ENCOUNTER — Ambulatory Visit (INDEPENDENT_AMBULATORY_CARE_PROVIDER_SITE_OTHER): Payer: 59 | Admitting: Orthopedic Surgery

## 2022-09-19 ENCOUNTER — Other Ambulatory Visit (INDEPENDENT_AMBULATORY_CARE_PROVIDER_SITE_OTHER): Payer: 59

## 2022-09-19 VITALS — BP 174/97 | HR 100 | Ht 62.0 in | Wt 194.0 lb

## 2022-09-19 DIAGNOSIS — M542 Cervicalgia: Secondary | ICD-10-CM

## 2022-09-19 DIAGNOSIS — M898X1 Other specified disorders of bone, shoulder: Secondary | ICD-10-CM

## 2022-09-19 DIAGNOSIS — M62830 Muscle spasm of back: Secondary | ICD-10-CM

## 2022-09-19 MED ORDER — METHOCARBAMOL 500 MG PO TABS: 500.0000 mg | ORAL_TABLET | Freq: Three times a day (TID) | ORAL | 1 refills | Status: DC

## 2022-09-19 NOTE — Progress Notes (Signed)
Chief Complaint  Patient presents with   Neck Pain    Has pain right side trapezius area and left scapular area states pain does go into neck also / shoulders not painful with ROM     Office Visit Note   Patient: [name]           Date of Birth: 1945/10/21           MRN: [id number] Visit Date: 09/19/2022 Requested by: Celene Squibb, MD [place],   [id number] PCP: Celene Squibb, MD  Subjective: Chief Complaint  Patient presents with   Neck Pain    Has pain right side trapezius area and left scapular area states pain does go into neck also / shoulders not painful with ROM    HPI: This is a 77 year old female comes in with atraumatic onset of pain in the right periscapular region and left perithoracic region with no history of trauma.  She does complain of some neck soreness as well in both left and right trapezius muscles              ROS: She has no neurologic symptoms  Assessment & Plan: Visit Diagnoses:  1. Spasm of thoracic back muscle left   2. Neck pain   3. Periscapular pain right     Plan:   Meds ordered this encounter  Medications   methocarbamol (ROBAXIN) 500 MG tablet    Sig: Take 1 tablet (500 mg total) by mouth 3 (three) times daily.    Dispense:  60 tablet    Refill:  1   Heating pad  Activity modification  Follow-Up Instructions: Return if symptoms worsen or fail to improve.   Orders:  Orders Placed This Encounter  Procedures   DG Cervical Spine 2 or 3 views   Meds ordered this encounter  Medications   methocarbamol (ROBAXIN) 500 MG tablet    Sig: Take 1 tablet (500 mg total) by mouth 3 (three) times daily.    Dispense:  60 tablet    Refill:  1      Procedures: Imaging of the cervical spine was performed   Clinical Data: No additional findings.  Objective: Vital Signs: BP (!) 174/97   Pulse 100   Ht 5\' 2"  (1.575 m)   Wt 194 lb (88 kg)   BMI 35.48 kg/m   Physical Exam:   GEN normal grooming and  hygiene  MS awake alert and oriented x 3  PSYCH pleasant mood and affect  CDV normal pulses in the upper extremity  NEURO normal sensation in the upper extremity as well  Ortho Exam: Cervical exam is benign other than some stiffness  She is tender over the right periscapular region and left parathoracic region in the musculature of the thoracic spine is nontender the cervical spine is nontender the neurovascular exam is normal  Specialty Comments:  No specialty comments available.  Imaging: DG Cervical Spine 2 or 3 views  Result Date: 09/19/2022 C-spine to views Periscapular pain perithoracic pain Increased cervical lordosis and increased kyphosis in the thoracic spine with C3 and 4 disc space narrowing and degenerative changes mild forward spondylolisthesis of 4 on 5 there may be retrolisthesis of 5 on 6 with mild degenerative changes in the disc space Therapy is to be cervical spine with scoliosis uncovertebral joint arthrosis in the mid cervical spine Impression moderate to severe spondylosis with retrolisthesis and anterolisthesis as noted above     PMFS History: Patient  Active Problem List   Diagnosis Date Noted   Mild intermittent asthma 03/28/2022   Osteoarthritis 03/28/2022   Proteinuria 03/28/2022   Diarrhea 12/12/2021   AKI (acute kidney injury) ([place]) 08/21/2021   Abnormal EKG 08/21/2021   Type 2 diabetes mellitus ([place]) 08/21/2021   Hypokalemia 08/20/2021   Early satiety 08/14/2021   Loss of weight 08/14/2021   Chronic diastolic heart failure ([place]) 04/01/2021   Chronic pain syndrome 04/01/2021   Chronic kidney disease, stage 3a ([place]) 12/21/2020   Neck pain 03/20/2020   Bloating 11/18/2019   Chronic migraine without aura, with status migrainosus 11/04/2019   Mild cognitive disorder 11/04/2019   Oropharyngeal dysphagia    H/O adenomatous polyp of colon 09/29/2017   Lower abdominal pain 09/29/2017   Obstructive sleep apnea 09/06/2017   Obesity (BMI 30-39.9)  03/07/2017   Primary osteoarthritis of both hands 03/07/2017   Lower extremity edema 03/03/2017   Restless legs syndrome 01/20/2017   Thyroid nodule 10/17/2016   Pruritus 10/06/2016   Major depressive disorder with psychotic features ([place]) 09/11/2016   Neurocognitive deficits 09/11/2016   (HFpEF) heart failure with preserved ejection fraction ([place]) 07/22/2016   Benzodiazepine withdrawal, with perceptual disturbance ([place]) 07/22/2016   Urinary frequency 11/07/2015   Abdominal pain 07/26/2015   Hyperparathyroidism ([place]) 06/15/2015   History of depression 06/14/2015   Hypercalcemia 06/13/2015   Major neurocognitive disorder ([place]) 06/13/2015   Confusion 05/29/2015   Odynophagia    Schatzki's ring    Hiatal hernia    Knee contusion 05/02/2014   Acquired trigger finger 10/12/2013   Chronic constipation 07/15/2013   Abdominal pain, epigastric 07/15/2013   LLQ pain 07/15/2013   Lightheaded 01/21/2013   Elevated LFTs 12/26/2012   Insomnia 05/06/2012   History of benign esophageal tumor    Adenomatous colon polyp    Epigastric pain 12/10/2010   DEGENERATIVE [place] DISEASE, LUMBOSACRAL SPINE 05/01/2010   Mixed hyperlipidemia 08/22/2009   THYROID NODULE 08/16/2009   Anxiety and depression 08/16/2009   Essential hypertension 08/16/2009   Gastroesophageal reflux disease 08/16/2009   FIBROMYALGIA 08/16/2009   Past Medical History:  Diagnosis Date   Adenomatous colon polyp 2006   excised in 2006 & 2010Due surveillance 06/2013   Anxiety    Anxiety and depression    Asthma    Breast mass    right nipple bengn mass per patient   Breast tumor    Chest discomfort    Chronic back pain    Chronic constipation    Complication of anesthesia    Depression    Diverticulosis    Fibromyalgia    Gastroesophageal reflux disease    Hiatal hernia    History of benign esophageal tumor 2010   granular cell esophageal tumor (Dx 06/2008), resected via EMR 2011, due repeat EGD 02/2012    Hyperlipidemia    Hypertension    Insomnia    Migraines    PONV (postoperative nausea and vomiting)    Psychosis ([place])    Sleep apnea    Stop Bang score of 5. Pt said she was told by Dr. Merlene Laughter that she had "a little bit" of sleep apena, but not bad enough to treat.   Thyroid nodule    Tumor of esophagus     Family History  Problem Relation Age of Onset   Stroke Father    Alcohol abuse Father    Heart attack Mother    Depression Mother    Anxiety disorder Mother    Colon cancer Paternal Aunt  Colon cancer Paternal Uncle    Dementia Maternal Uncle    ADD / ADHD Neg Hx    Bipolar disorder Neg Hx    Drug abuse Neg Hx    OCD Neg Hx    Paranoid behavior Neg Hx    Schizophrenia Neg Hx    Seizures Neg Hx    Sexual abuse Neg Hx    Physical abuse Neg Hx     Past Surgical History:  Procedure Laterality Date   ABDOMINAL HYSTERECTOMY     BALLOON DILATION  08/23/2021   Procedure: BALLOON DILATION;  Surgeon: Eloise Harman, DO;  [facility];  Service: Endoscopy;;   BIOPSY  10/14/2019   Procedure: BIOPSY;  Surgeon: Daneil Dolin, MD;  [facility];  Service: Endoscopy;;   BRAVO [place] STUDY  12/11/2011   Procedure: BRAVO [place] STUDY;  Surgeon: Daneil Dolin, MD;  [facility];  Service: Endoscopy;  Laterality: N/A;   BREAST EXCISIONAL BIOPSY  1990s, 2012   Left x2-sclerosing ductal papilloma-2012   CHOLECYSTECTOMY N/A 04/09/2013   Procedure: LAPAROSCOPIC CHOLECYSTECTOMY;  Surgeon: Jamesetta So, MD;  Location: [facility];  Service: General;  Laterality: N/A;   COLONOSCOPY  06/2008, 06/2011   sigmoid tics, tubular adenoma; 2013: anal canal hemorrhoids   COLONOSCOPY  07/10/2011   Anal canal hemorrhoids likely the cause of hematochezia in the setting of constipation; otherwise normal rectum ;submucosal  petechiae in left colon of doubtful clinical significance; otherwise, normal colon   COLONOSCOPY WITH PROPOFOL N/A 11/10/2017   cancelled in pre-op    COLONOSCOPY WITH PROPOFOL N/A 07/23/2018   Procedure: COLONOSCOPY WITH PROPOFOL;  Surgeon: Daneil Dolin, MD; 1 tubular adenoma, diverticulosis in the sigmoid and descending colon, nonbleeding internal hemorrhoids.  No recommendations to repeat due to age.   ESOPHAGEAL BRUSHING  08/23/2021   Procedure: ESOPHAGEAL BRUSHING;  Surgeon: Eloise Harman, DO;  [facility];  Service: Endoscopy;;   ESOPHAGEAL DILATION N/A 05/08/2015   Procedure: ESOPHAGEAL DILATION;  Surgeon: Daneil Dolin, MD;  Location: [facility];  Service: Endoscopy;  Laterality: N/AVenia Minks 54/56   ESOPHAGOGASTRODUODENOSCOPY  12/11/2011   [id number] Schatzki's ring; otherwise normal/Small hiatal hernia. Antral and bulbar erosions   ESOPHAGOGASTRODUODENOSCOPY (EGD) WITH PROPOFOL N/A 05/08/2015   Dr. Gala Romney: mild erosive reflux esophagitis, non-critical Schatzki's ring s/p dilation. Hiatal hernia.    ESOPHAGOGASTRODUODENOSCOPY (EGD) WITH PROPOFOL N/A 10/14/2019   Procedure: ESOPHAGOGASTRODUODENOSCOPY (EGD) WITH PROPOFOL;  Surgeon: Daneil Dolin, MD;  Nonobstructing Schatzki ring, small hiatal hernia, abnormal gastric mucosa s/p biopsied, normal first and second portion of the duodenum.  Pathology with chronic inactive gastritis, no H. pylori.   ESOPHAGOGASTRODUODENOSCOPY (EGD) WITH PROPOFOL N/A 08/23/2021   Surgeon: Eloise Harman, DO;   white speckled mucosa in the esophagus s/p cells obtained for cytology (KOH prep negative), medium-sized hiatal hernia, grade A reflux esophagitis, mild Schatzki's ring s/p dilation, normal examined stomach and duodenum.   EUS  08/2010   Dr [facility] with EGD. Retained food. No recurrent esophageal lesion, bx negative.   IR KYPHO THORACIC WITH BONE BIOPSY  10/17/2020   IR RADIOLOGIST EVAL & MGMT  09/12/2020   LUNG BIOPSY     negative   PARATHYROIDECTOMY  09/2015   [facility].    PARATHYROIDECTOMY     POLYPECTOMY  07/23/2018   Procedure: POLYPECTOMY;  Surgeon: Daneil Dolin,  MD;  [facility];  Service: Endoscopy;;  colon   RIGHT OOPHORECTOMY  benign disease   SHOULDER ARTHROSCOPY     Right; bone spurs removed   TONSILLECTOMY     TOTAL KNEE ARTHROPLASTY  2002   Right; previous arthroscopic surgery   Social History   Occupational History   Occupation: disabled    Employer: RETIRED  Tobacco Use   Smoking status: Never   Smokeless tobacco: Never  Vaping Use   Vaping Use: Never used  Substance and Sexual Activity   Alcohol use: No    Alcohol/week: 0.0 standard drinks of alcohol   Drug use: No   Sexual activity: Never

## 2022-09-24 DIAGNOSIS — L501 Idiopathic urticaria: Secondary | ICD-10-CM | POA: Diagnosis not present

## 2022-09-24 DIAGNOSIS — T783XXA Angioneurotic edema, initial encounter: Secondary | ICD-10-CM | POA: Diagnosis not present

## 2022-09-24 DIAGNOSIS — R0609 Other forms of dyspnea: Secondary | ICD-10-CM | POA: Diagnosis not present

## 2022-09-24 DIAGNOSIS — L299 Pruritus, unspecified: Secondary | ICD-10-CM | POA: Diagnosis not present

## 2022-09-25 LAB — BRAIN NATRIURETIC PEPTIDE: BNP: 30.4 pg/mL (ref 0.0–100.0)

## 2022-10-01 LAB — CBC WITH DIFFERENTIAL/PLATELET
Basophils Absolute: 0 10*3/uL (ref 0.0–0.2)
Basos: 1 %
EOS (ABSOLUTE): 0.1 10*3/uL (ref 0.0–0.4)
Eos: 1 %
Hematocrit: 41.4 % (ref 34.0–46.6)
Hemoglobin: 13.2 g/dL (ref 11.1–15.9)
Immature Grans (Abs): 0 10*3/uL (ref 0.0–0.1)
Immature Granulocytes: 0 %
Lymphocytes Absolute: 1.5 10*3/uL (ref 0.7–3.1)
Lymphs: 27 %
MCH: 25 pg — ABNORMAL LOW (ref 26.6–33.0)
MCHC: 31.9 g/dL (ref 31.5–35.7)
MCV: 78 fL — ABNORMAL LOW (ref 79–97)
Monocytes Absolute: 0.7 10*3/uL (ref 0.1–0.9)
Monocytes: 12 %
Neutrophils Absolute: 3.3 10*3/uL (ref 1.4–7.0)
Neutrophils: 59 %
Platelets: 252 10*3/uL (ref 150–450)
RBC: 5.28 x10E6/uL (ref 3.77–5.28)
RDW: 16.2 % — ABNORMAL HIGH (ref 11.7–15.4)
WBC: 5.7 10*3/uL (ref 3.4–10.8)

## 2022-10-01 LAB — ALPHA-GAL PANEL
Allergen Lamb IgE: <0.1 kU/L
Beef IgE: <0.1 kU/L
IgE (Immunoglobulin E), Serum: 29 IU/mL (ref 6–495)
O215-IgE Alpha-Gal: <0.1 kU/L
Pork IgE: <0.1 kU/L

## 2022-10-01 LAB — C3 AND C4
Complement C3, Serum: 150 mg/dL (ref 82–167)
Complement C4, Serum: 26 mg/dL (ref 12–38)

## 2022-10-01 LAB — ANA W/REFLEX: Anti Nuclear Antibody (ANA): NEGATIVE

## 2022-10-01 LAB — TSH+FREE T4
Free T4: 1.21 ng/dL (ref 0.82–1.77)
TSH: 0.454 u[IU]/mL (ref 0.450–4.500)

## 2022-10-01 LAB — COMPLEMENT COMPONENT C1Q: Complement C1Q: 11.2 mg/dL (ref 10.3–20.5)

## 2022-10-01 LAB — TRYPTASE: Tryptase: 9.7 ug/L (ref 2.2–13.2)

## 2022-10-01 LAB — C1 ESTERASE INHIBITOR, FUNCTIONAL: C1INH Functional/C1INH Total MFr SerPl: >110 %mean normal

## 2022-10-05 DIAGNOSIS — I5032 Chronic diastolic (congestive) heart failure: Secondary | ICD-10-CM | POA: Diagnosis not present

## 2022-10-05 DIAGNOSIS — Z8679 Personal history of other diseases of the circulatory system: Secondary | ICD-10-CM | POA: Diagnosis not present

## 2022-10-05 DIAGNOSIS — J310 Chronic rhinitis: Secondary | ICD-10-CM | POA: Diagnosis not present

## 2022-10-05 DIAGNOSIS — J069 Acute upper respiratory infection, unspecified: Secondary | ICD-10-CM | POA: Diagnosis not present

## 2022-10-05 DIAGNOSIS — Z713 Dietary counseling and surveillance: Secondary | ICD-10-CM | POA: Diagnosis not present

## 2022-10-05 DIAGNOSIS — I110 Hypertensive heart disease with heart failure: Secondary | ICD-10-CM | POA: Diagnosis not present

## 2022-10-05 DIAGNOSIS — L299 Pruritus, unspecified: Secondary | ICD-10-CM | POA: Diagnosis not present

## 2022-10-05 DIAGNOSIS — R0609 Other forms of dyspnea: Secondary | ICD-10-CM | POA: Diagnosis not present

## 2022-10-07 NOTE — Progress Notes (Signed)
Referring Provider: Benita Stabile, MD Primary Care Physician:  Benita Stabile, MD Primary GI Physician: Dr. Jena Gauss  No chief complaint on file.   HPI:   [name] is a 77 y.o. female with history of likely opioid induced constipation, adenomatous colon polyps, GERD, esophageal dysphagia with Schatzki's ring dilated in April 2021 and 2023, oropharyngeal dysphagia with recommendations for D2/chopped diet, esophageal granular cell tumor s/p EMR in 2011 by Dr. Margaretha Glassing at [facility], presenting today for follow-up of ***  Last seen in our office 06/10/2022.  She was taking pantoprazole 40 mg twice daily and famotidine for breakthrough GERD with symptoms fairly well-controlled.  Weight was stable.  Noted some morning nausea with potassium supplement.  Swallowing function stable.reported she is still having some trouble with her tongue being swollen and her dentist thought it may be secondary to a neurological issue.  No longer taking Trulance as her daily protein shake was keeping her regular; however, for the last couple of weeks, she was having some constipation taking the stool softener 3 times a day.  BMs daily but incomplete.  Requested to resume Trulance.  Advised to continue current medications including magic mouth wash for mouth burning, start Trulance,  follow-up in 4 months.   Patient sister requested referral to speech therapy.  Patient saw SLP 07/22/2022.Pt with min prolonged oral prep and piecemeal deglutition with puree Pt with normal oral bolus transit time with regular textures (graham crackers) and with barium tablet with thin. Swallow trigger is generally at the level of the valleculae and Pt with trace/min vallecular and pyriform residue after the initial swallow, but elicits a spontaneous second swallow and clears. Esophageal sweep completed and revealed brief stasis of barium tablet in the distal esophagus, but did clear with liquid wash. Recommend self regulated regular textures and  thin liquids with Pt to take small bites of solids and alternate solids with liquids, avoid textures she finds challenging if necessary, PO medications whole with liquids.   Today:     ***Aciphex previously worked well.   Last EGD 08/23/2021 white speckled mucosa in the esophagus s/p cells obtained for cytology (KOH prep negative), medium-sized hiatal hernia, grade A reflux esophagitis, mild Schatzki's ring s/p dilation, normal examined stomach and duodenum.   Past Medical History:  Diagnosis Date   Adenomatous colon polyp 2006   excised in 2006 & 2010Due surveillance 06/2013   Anxiety    Anxiety and depression    Asthma    Breast mass    right nipple bengn mass per patient   Breast tumor    Chest discomfort    Chronic back pain    Chronic constipation    Complication of anesthesia    Depression    Diverticulosis    Fibromyalgia    Gastroesophageal reflux disease    Hiatal hernia    History of benign esophageal tumor 2010   granular cell esophageal tumor (Dx 06/2008), resected via EMR 2011, due repeat EGD 02/2012   Hyperlipidemia    Hypertension    Insomnia    Migraines    PONV (postoperative nausea and vomiting)    Psychosis (HCC)    Sleep apnea    Stop Bang score of 5. Pt said she was told by Dr. Gerilyn Pilgrim that she had "a little bit" of sleep apena, but not bad enough to treat.   Thyroid nodule    Tumor of esophagus     Past Surgical History:  Procedure Laterality Date  ABDOMINAL HYSTERECTOMY     BALLOON DILATION  08/23/2021   Procedure: BALLOON DILATION;  Surgeon: Lanelle Bal, DO;  [facility];  Service: Endoscopy;;   BIOPSY  10/14/2019   Procedure: BIOPSY;  Surgeon: Corbin Ade, MD;  [facility]: [facility];  Service: Endoscopy;;   BRAVO PH STUDY  12/11/2011   Procedure: BRAVO PH STUDY;  Surgeon: Corbin Ade, MD;  [facility];  Service: Endoscopy;  Laterality: N/A;   BREAST EXCISIONAL BIOPSY  1990s, 2012   Left x2-sclerosing  ductal papilloma-2012   CHOLECYSTECTOMY N/A 04/09/2013   Procedure: LAPAROSCOPIC CHOLECYSTECTOMY;  Surgeon: Dalia Heading, MD;  Location: [facility];  Service: General;  Laterality: N/A;   COLONOSCOPY  06/2008, 06/2011   sigmoid tics, tubular adenoma; 2013: anal canal hemorrhoids   COLONOSCOPY  07/10/2011   Anal canal hemorrhoids likely the cause of hematochezia in the setting of constipation; otherwise normal rectum ;submucosal  petechiae in left colon of doubtful clinical significance; otherwise, normal colon   COLONOSCOPY WITH PROPOFOL N/A 11/10/2017   cancelled in pre-op   COLONOSCOPY WITH PROPOFOL N/A 07/23/2018   Procedure: COLONOSCOPY WITH PROPOFOL;  Surgeon: Corbin Ade, MD; 1 tubular adenoma, diverticulosis in the sigmoid and descending colon, nonbleeding internal hemorrhoids.  No recommendations to repeat due to age.   ESOPHAGEAL BRUSHING  08/23/2021   Procedure: ESOPHAGEAL BRUSHING;  Surgeon: Lanelle Bal, DO;  [facility];  Service: Endoscopy;;   ESOPHAGEAL DILATION N/A 05/08/2015   Procedure: ESOPHAGEAL DILATION;  Surgeon: Corbin Ade, MD;  Location: [facility];  Service: Endoscopy;  Laterality: N/AElease Hashimoto 54/56   ESOPHAGOGASTRODUODENOSCOPY  12/11/2011   [id number] Schatzki's ring; otherwise normal/Small hiatal hernia. Antral and bulbar erosions   ESOPHAGOGASTRODUODENOSCOPY (EGD) WITH PROPOFOL N/A 05/08/2015   Dr. Jena Gauss: mild erosive reflux esophagitis, non-critical Schatzki's ring s/p dilation. Hiatal hernia.    ESOPHAGOGASTRODUODENOSCOPY (EGD) WITH PROPOFOL N/A 10/14/2019   Procedure: ESOPHAGOGASTRODUODENOSCOPY (EGD) WITH PROPOFOL;  Surgeon: Corbin Ade, MD;  Nonobstructing Schatzki ring, small hiatal hernia, abnormal gastric mucosa s/p biopsied, normal first and second portion of the duodenum.  Pathology with chronic inactive gastritis, no H. pylori.   ESOPHAGOGASTRODUODENOSCOPY (EGD) WITH PROPOFOL N/A 08/23/2021   Surgeon: Lanelle Bal, DO;    white speckled mucosa in the esophagus s/p cells obtained for cytology (KOH prep negative), medium-sized hiatal hernia, grade A reflux esophagitis, mild Schatzki's ring s/p dilation, normal examined stomach and duodenum.   EUS  08/2010   Dr [facility] with EGD. Retained food. No recurrent esophageal lesion, bx negative.   IR KYPHO THORACIC WITH BONE BIOPSY  10/17/2020   IR RADIOLOGIST EVAL & MGMT  09/12/2020   LUNG BIOPSY     negative   PARATHYROIDECTOMY  09/2015   [facility].    PARATHYROIDECTOMY     POLYPECTOMY  07/23/2018   Procedure: POLYPECTOMY;  Surgeon: Corbin Ade, MD;  [facility];  Service: Endoscopy;;  colon   RIGHT OOPHORECTOMY     benign disease   SHOULDER ARTHROSCOPY     Right; bone spurs removed   TONSILLECTOMY     TOTAL KNEE ARTHROPLASTY  2002   Right; previous arthroscopic surgery    Current Outpatient Medications  Medication Sig Dispense Refill   acetaminophen (TYLENOL) 500 MG tablet Take 2 tablets (1,000 mg total) by mouth every 8 (eight) hours as needed for mild pain, moderate pain or fever.     albuterol (PROVENTIL HFA;VENTOLIN HFA) 108 (90 BASE) MCG/ACT  inhaler Inhale 2 puffs into the lungs every 6 (six) hours as needed for wheezing or shortness of breath.      amLODipine (NORVASC) 10 MG tablet Take 10 mg by mouth daily.     Atogepant (QULIPTA) 30 MG TABS Take 30 mg by mouth daily. 30 tablet 6   azelastine (ASTELIN) 0.1 % nasal spray in the morning and at bedtime.     Azelastine-Fluticasone 137-50 MCG/ACT SUSP Spray 1 spray twice a day by intranasal route.     BELSOMRA 10 MG TABS Take 1 tablet by mouth at bedtime.     Calcium Carbonate-Vit D-Min (CALCIUM 1200 PO) Take by mouth daily.     cetirizine (ZYRTEC ALLERGY) 10 MG tablet Take 1 tablet (10 mg total) by mouth daily as needed for allergies (itching). 30 tablet 5   clobetasol ointment (TEMOVATE) 0.05 % APPLY A THIN LAYER TO THE AFFECTED AREA(S) BY TOPICAL ROUTE 2 TIMES PER DAY     famotidine  (PEPCID) 20 MG tablet TAKE TWO TABLETS (40MG  TOTAL) BY MOUTH AT BEDTIME     fluticasone (FLONASE) 50 MCG/ACT nasal spray Place 2 sprays into both nostrils daily. 16 g 5   furosemide (LASIX) 40 MG tablet Take 1 tablet (40 mg total) by mouth daily. 90 tablet 3   Galcanezumab-gnlm (EMGALITY) 120 MG/ML SOAJ Inject 1 Pen into the skin every 30 (thirty) days. 1.12 mL 6   Galcanezumab-gnlm (EMGALITY) 120 MG/ML SOAJ Inject 2 Pens into the skin every 30 (thirty) days. First dose only 2.24 mL 0   hydrALAZINE (APRESOLINE) 100 MG tablet Take 1 tablet (100 mg total) by mouth 3 (three) times daily. 270 tablet 1   hydrOXYzine (ATARAX) 25 MG tablet Take one tablet by mouth once daily as needed for anxiety     levothyroxine (SYNTHROID) 25 MCG tablet Take 25 mcg by mouth daily.     lovastatin (MEVACOR) 40 MG tablet Take 40 mg by mouth at bedtime.     magic mouthwash w/lidocaine SOLN Take 5 mLs by mouth 2 (two) times daily as needed for mouth pain. Swish in the oral cavity for 1-2 minutes and then spit out the liquid. Do not swallow. 400 mL 0   methocarbamol (ROBAXIN) 500 MG tablet Take 1 tablet (500 mg total) by mouth 3 (three) times daily. 60 tablet 1   Multiple Vitamins-Minerals (CENTRUM SILVER 50+WOMEN PO) Take by mouth daily.     pantoprazole (PROTONIX) 40 MG tablet TAKE ONE TABLET (40MG  TOTAL) BY MOUTH TWO TIMES DAILY 60 tablet 5   Plecanatide (TRULANCE) 3 MG TABS Take 3 mg by mouth daily. 30 tablet 3   potassium chloride SA (KLOR-CON M) 20 MEQ tablet Take 1 tablet (20 mEq total) by mouth daily.     Ubrogepant (UBRELVY) 100 MG TABS Take 100 mg by mouth as needed (for migraines). May repeat a dose in 2 hours if headache persists. Max dose 2 pills in 24 hours 16 tablet 6   zolpidem (AMBIEN) 10 MG tablet Take 10 mg by mouth at bedtime as needed.     No current facility-administered medications for this visit.    Allergies as of 10/10/2022 - Review Complete 09/19/2022  Allergen Reaction Noted   Elavil  [amitriptyline] Other (See Comments) 06/03/2012   Abilify [aripiprazole] Other (See Comments) 11/25/2012   Codeine Hives, Nausea Only, and Other (See Comments)    Latex Hives 11/21/2011   Losartan  09/17/2022   Penicillins Hives, Itching, and Other (See Comments)    Polyethylene glycol Other (  See Comments) 12/26/2012   Spironolactone  05/14/2022   Sulfonamide derivatives Nausea And Vomiting    Cymbalta [duloxetine hcl] Rash 03/10/2013   Remeron [mirtazapine] Other (See Comments) 05/06/2012    Family History  Problem Relation Age of Onset   Stroke Father    Alcohol abuse Father    Heart attack Mother    Depression Mother    Anxiety disorder Mother    Colon cancer Paternal Aunt    Colon cancer Paternal Uncle    Dementia Maternal Uncle    ADD / ADHD Neg Hx    Bipolar disorder Neg Hx    Drug abuse Neg Hx    OCD Neg Hx    Paranoid behavior Neg Hx    Schizophrenia Neg Hx    Seizures Neg Hx    Sexual abuse Neg Hx    Physical abuse Neg Hx     Social History   Socioeconomic History   Marital status: Single    Spouse name: Not on file   Number of children: 1   Years of education: Not on file   Highest education level: Not on file  Occupational History   Occupation: disabled    Employer: RETIRED  Tobacco Use   Smoking status: Never   Smokeless tobacco: Never  Vaping Use   Vaping Use: Never used  Substance and Sexual Activity   Alcohol use: No    Alcohol/week: 0.0 standard drinks of alcohol   Drug use: No   Sexual activity: Never  Other Topics Concern   Not on file  Social History Narrative   Not on file   Social Determinants of Health   Financial Resource Strain: Not on file  Food Insecurity: Not on file  Transportation Needs: Not on file  Physical Activity: Not on file  Stress: Not on file  Social Connections: Not on file    Review of Systems: Gen: Denies fever, chills, anorexia. Denies fatigue, weakness, weight loss.  CV: Denies chest pain, palpitations,  syncope, peripheral edema, and claudication. Resp: Denies dyspnea at rest, cough, wheezing, coughing up blood, and pleurisy. GI: Denies vomiting blood, jaundice, and fecal incontinence.   Denies dysphagia or odynophagia. Derm: Denies rash, itching, dry skin Psych: Denies depression, anxiety, memory loss, confusion. No homicidal or suicidal ideation.  Heme: Denies bruising, bleeding, and enlarged lymph nodes.  Physical Exam: There were no vitals taken for this visit. General:   Alert and oriented. No distress noted. Pleasant and cooperative.  Head:  Normocephalic and atraumatic. Eyes:  Conjuctiva clear without scleral icterus. Heart:  S1, S2 present without murmurs appreciated. Lungs:  Clear to auscultation bilaterally. No wheezes, rales, or rhonchi. No distress.  Abdomen:  +BS, soft, non-tender and non-distended. No rebound or guarding. No HSM or masses noted. Msk:  Symmetrical without gross deformities. Normal posture. Extremities:  Without edema. Neurologic:  Alert and  oriented x4 Psych:  Normal mood and affect.    Assessment:     Plan:  ***   Ermalinda Memos, PA-C [facility] 10/10/2022

## 2022-10-10 ENCOUNTER — Ambulatory Visit (INDEPENDENT_AMBULATORY_CARE_PROVIDER_SITE_OTHER): Payer: 59 | Admitting: Gastroenterology

## 2022-10-10 ENCOUNTER — Encounter: Payer: Self-pay | Admitting: Gastroenterology

## 2022-10-10 ENCOUNTER — Other Ambulatory Visit: Payer: Self-pay | Admitting: Gastroenterology

## 2022-10-10 VITALS — BP 120/78 | HR 92 | Temp 98.2°F | Ht 62.0 in | Wt 189.6 lb

## 2022-10-10 DIAGNOSIS — K5909 Other constipation: Secondary | ICD-10-CM

## 2022-10-10 DIAGNOSIS — R630 Anorexia: Secondary | ICD-10-CM | POA: Diagnosis not present

## 2022-10-10 DIAGNOSIS — K219 Gastro-esophageal reflux disease without esophagitis: Secondary | ICD-10-CM

## 2022-10-10 NOTE — Patient Instructions (Signed)
Continue pantoprazole 40 mg twice daily 30 minutes before breakfast and dinner.  Continue famotidine nightly.  You may take an 1 extra famotidine during the day if needed for breakthrough reflux symptoms.  Follow a GERD diet:  Avoid fried, fatty, greasy, spicy, citrus foods. Avoid caffeine and carbonated beverages. Avoid chocolate. Try eating 4-6 small meals a day rather than 3 large meals. Do not eat within 3 hours of laying down. Prop head of bed up on wood or bricks to create a 6 inch incline.  I suspect your decreased appetite is related to your recent viral respiratory illness as you are doing very well before this.  This should slowly improve over the next several weeks as her respiratory symptoms also improved. I recommend that you continue drinking 2 protein shakes daily. You should also try to be intentional with your meals and try to eat 4-6 small meals a day until your appetite returns to normal. If you continue to have trouble with your appetite or continue to lose weight, please let me know.  We will plan to follow-up with you in 6 months or sooner if needed.  It was good to see you again today!  Ermalinda Memos, PA-C [facility]

## 2022-11-04 ENCOUNTER — Other Ambulatory Visit: Payer: Self-pay | Admitting: Orthopedic Surgery

## 2022-11-04 DIAGNOSIS — M898X1 Other specified disorders of bone, shoulder: Secondary | ICD-10-CM

## 2022-11-04 DIAGNOSIS — M62830 Muscle spasm of back: Secondary | ICD-10-CM

## 2022-11-07 ENCOUNTER — Ambulatory Visit: Payer: Medicare Other | Admitting: Family Medicine

## 2022-11-14 ENCOUNTER — Ambulatory Visit: Payer: 59 | Admitting: Family Medicine

## 2022-11-25 ENCOUNTER — Ambulatory Visit (INDEPENDENT_AMBULATORY_CARE_PROVIDER_SITE_OTHER): Payer: 59 | Admitting: Orthopedic Surgery

## 2022-11-25 ENCOUNTER — Other Ambulatory Visit (INDEPENDENT_AMBULATORY_CARE_PROVIDER_SITE_OTHER): Payer: 59

## 2022-11-25 ENCOUNTER — Encounter: Payer: Self-pay | Admitting: Orthopedic Surgery

## 2022-11-25 VITALS — BP 145/96 | HR 112 | Ht 62.0 in | Wt 190.0 lb

## 2022-11-25 DIAGNOSIS — M79672 Pain in left foot: Secondary | ICD-10-CM | POA: Diagnosis not present

## 2022-11-25 DIAGNOSIS — M722 Plantar fascial fibromatosis: Secondary | ICD-10-CM

## 2022-11-25 MED ORDER — MELOXICAM 7.5 MG PO TABS: 7.5000 mg | ORAL_TABLET | Freq: Every day | ORAL | 5 refills | Status: DC

## 2022-11-25 NOTE — Progress Notes (Unsigned)
Chief Complaint  Patient presents with   Foot Pain    Left heel when walking /thinks it may be neuropathy        New problem   Body mass index is 34.75 kg/m.  WHAT ARE WE SEEING YOU FOR TODAY?   left foot/feet  How long has this bothered you?   weeks approximately 3-4 week(s) ago  Anticoag.  No  Diabetes Yes  Heart disease Yes  Neurological ROS: Neuropathy feeling in the foot  No prior treatment  Chief Complaint  Patient presents with   Foot Pain    Left heel when walking /thinks it may be neuropathy     77 year old female no trauma to the left foot has pain in the back of the heel and bottom of the heel and some first step start up pain  Exam shows tenderness in the plantar fascia and the Achilles tendon but both seem mild to me range of motion is normal ankle is stable pulses are good color is normal  X-rays There is a small calcification in the Achilles tendon area and a small plantar bone spur  Impression possibly Achilles tendinitis and plantar fasciitis  Recommend sneakers with supportive shoe wear and anti-inflammatories return in 6 weeks  Meds ordered this encounter  Medications   meloxicam (MOBIC) 7.5 MG tablet    Sig: Take 1 tablet (7.5 mg total) by mouth daily.    Dispense:  30 tablet    Refill:  5

## 2022-12-05 ENCOUNTER — Encounter: Payer: 59 | Admitting: Obstetrics & Gynecology

## 2022-12-23 ENCOUNTER — Ambulatory Visit (INDEPENDENT_AMBULATORY_CARE_PROVIDER_SITE_OTHER): Payer: 59 | Admitting: Allergy

## 2022-12-23 ENCOUNTER — Encounter: Payer: Self-pay | Admitting: Allergy

## 2022-12-23 ENCOUNTER — Other Ambulatory Visit: Payer: Self-pay | Admitting: Orthopedic Surgery

## 2022-12-23 ENCOUNTER — Other Ambulatory Visit: Payer: Self-pay

## 2022-12-23 VITALS — BP 150/78 | HR 81 | Temp 97.9°F | Resp 18 | Wt 194.1 lb

## 2022-12-23 DIAGNOSIS — J310 Chronic rhinitis: Secondary | ICD-10-CM

## 2022-12-23 DIAGNOSIS — M62830 Muscle spasm of back: Secondary | ICD-10-CM

## 2022-12-23 DIAGNOSIS — L299 Pruritus, unspecified: Secondary | ICD-10-CM

## 2022-12-23 DIAGNOSIS — T783XXD Angioneurotic edema, subsequent encounter: Secondary | ICD-10-CM | POA: Diagnosis not present

## 2022-12-23 DIAGNOSIS — R21 Rash and other nonspecific skin eruption: Secondary | ICD-10-CM

## 2022-12-23 DIAGNOSIS — H1013 Acute atopic conjunctivitis, bilateral: Secondary | ICD-10-CM

## 2022-12-23 DIAGNOSIS — M898X1 Other specified disorders of bone, shoulder: Secondary | ICD-10-CM

## 2022-12-23 DIAGNOSIS — H109 Unspecified conjunctivitis: Secondary | ICD-10-CM

## 2022-12-23 NOTE — Patient Instructions (Addendum)
Rash/Pruritus (Itching) - The facial pruritus is likely related to the rash.   - Continue to follow up with Dermatology. This is not hives/eczema.   - Your bloodwork was all normal and reassuring - Do a daily soaking tub bath in warm water for 10-15 minutes.  - Use a gentle, unscented cleanser at the end of the bath (such as Dove unscented bar or baby wash, or Aveeno sensitive body wash). Then rinse, pat half-way dry, and apply a gentle, unscented moisturizer cream or ointment (Cerave, Cetaphil, Eucerin, Aveeno)  all over while still damp. Dry skin makes the itching worse. The skin should be moisturized with a gentle, unscented moisturizer at least twice daily.  - Use only unscented liquid laundry detergent. - You can also try Sarna anti-itch lotion as needed.    Angioedema (Swelling) - Keep track of how often this is happening and how long it lasts. - Take pictures of this also. - As above your bloodwork was all reassuring - Would increase Cetirizine (Zyrtec) to 1 tab twice a day with Famotidine (Pepcid) 1 tab twice a day  Chronic Rhinitis: - Use nasal saline rinses before nose sprays such as with Neilmed Sinus Rinse.  Use distilled water.   - Use Flonase 2 sprays each nostril daily for nasal congestion/stuffy nose. Aim upward and outward. - Use Azelastine 1-2 sprays each nostril twice daily as needed for runny nose, drainage, throat clearing, sneezing. Aim upward and outward. - Use Zyrtec 10 mg as above twice a day at this time - For itchy/watery eyes can use Pataday 1 drop each eye daily as needed  Follow-up with Dr Allena Katz in 3 months or sooner if needed

## 2022-12-23 NOTE — Progress Notes (Signed)
Follow-up Note  RE: [name] MRN: [id number] DOB: [birth year] Date of Office Visit: 12/23/2022   History of present illness: [name] is a 77 y.o. female presenting today for follow-up of rash, swelling and rhinitis.  She was last seen in the office on 09/18/22 by Dr Allena Katz.  She presents today with her daughter.   She states Dr Allena Katz told her to take a picture of her tongue if it swells.  She showed me a picture of her tongue today that did show some mild edema in comparison to her normal tongue on exam today.  She states last tongue swelling occurred on June 1 and can occur sometimes couple times a month. It usually last for about a day.  She can have trouble swallowing with it.  She states she is taking zyrtec daily since her last visit.  She is taking Famotidine twice a day.   She has 2 nasal sprays flonase and asteline.  She states the drug store told her not to use both of them together.   She states she itches all the time and doesn't stop itching.  She sees a dermatologist and states was recommended to use a cream fluticasone-ketoconazole mixture.   Review of systems in the past 4 weeks: Review of Systems  Constitutional: Negative.   HENT: Negative.    Eyes: Negative.   Respiratory: Negative.    Cardiovascular: Negative.   Gastrointestinal: Negative.   Musculoskeletal: Negative.   Skin:        See HPI  Allergic/Immunologic: Negative.   Neurological: Negative.      All other systems negative unless noted above in HPI  Past medical/social/surgical/family history have been reviewed and are unchanged unless specifically indicated below.  No changes  Medication List: Current Outpatient Medications  Medication Sig Dispense Refill   albuterol (PROVENTIL HFA;VENTOLIN HFA) 108 (90 BASE) MCG/ACT inhaler Inhale 2 puffs into the lungs every 6 (six) hours as needed for wheezing or shortness of breath.      amLODipine (NORVASC) 10 MG tablet Take 10 mg by mouth daily.      azelastine (ASTELIN) 0.1 % nasal spray in the morning and at bedtime.     Calcium Carbonate-Vit D-Min (CALCIUM 1200 PO) Take by mouth daily.     cetirizine (ZYRTEC ALLERGY) 10 MG tablet Take 1 tablet (10 mg total) by mouth daily as needed for allergies (itching). 30 tablet 5   Cyanocobalamin (VITAMIN B12 PO) Take by mouth.     famotidine (PEPCID) 20 MG tablet TAKE TWO TABLETS (40MG  TOTAL) BY MOUTH AT BEDTIME     Ferrous Sulfate (IRON PO) Take by mouth.     fluorometholone (FML) 0.1 % ophthalmic suspension SMARTSIG:In Eye(s)     fluticasone (FLONASE) 50 MCG/ACT nasal spray Place 2 sprays into both nostrils daily. 16 g 5   fluticasone 0.05%-ketoconazole 2% 1:1 cream mixture Apply topically.     furosemide (LASIX) 40 MG tablet Take 1 tablet (40 mg total) by mouth daily. 90 tablet 3   hydrALAZINE (APRESOLINE) 100 MG tablet Take 1 tablet (100 mg total) by mouth 3 (three) times daily. 270 tablet 1   ketoconazole (NIZORAL) 2 % cream Apply 1 Application topically daily.     levothyroxine (SYNTHROID) 25 MCG tablet Take 25 mcg by mouth daily.     lovastatin (MEVACOR) 40 MG tablet Take 40 mg by mouth at bedtime.     meloxicam (MOBIC) 7.5 MG tablet Take 1 tablet (7.5 mg total) by mouth daily.  30 tablet 5   methocarbamol (ROBAXIN) 500 MG tablet TAKE ONE (1) TABLET BY MOUTH 3 TIMES DAILY 60 tablet 1   Multiple Vitamins-Minerals (CENTRUM SILVER 50+WOMEN PO) Take by mouth daily.     pantoprazole (PROTONIX) 40 MG tablet TAKE ONE TABLET (40MG  TOTAL) BY MOUTH TWO TIMES DAILY 60 tablet 5   potassium chloride SA (KLOR-CON M) 20 MEQ tablet Take 1 tablet (20 mEq total) by mouth daily.     RESTASIS 0.05 % ophthalmic emulsion 1 drop 2 (two) times daily.     Ubrogepant (UBRELVY) 100 MG TABS Take 100 mg by mouth as needed (for migraines). May repeat a dose in 2 hours if headache persists. Max dose 2 pills in 24 hours 16 tablet 6   Azelastine-Fluticasone 137-50 MCG/ACT SUSP Spray 1 spray twice a day by intranasal route.  (Patient not taking: Reported on 12/23/2022)     hydrOXYzine (ATARAX) 25 MG tablet  (Patient not taking: Reported on 12/23/2022)     zolpidem (AMBIEN) 10 MG tablet Take 10 mg by mouth at bedtime as needed. (Patient not taking: Reported on 10/10/2022)     No current facility-administered medications for this visit.     Known medication allergies: Allergies  Allergen Reactions   Elavil [Amitriptyline] Other (See Comments)    Felt really nervous and felt like something was hold her feet or arm and/ or AM headache on it and back on it and went away off it.    Abilify [Aripiprazole] Other (See Comments)    Dystonic reaction   Codeine Hives, Nausea Only and Other (See Comments)    Feels funny   Latex Hives   Losartan     Tongue Swelling   Penicillins Hives, Itching and Other (See Comments)    Has patient had a PCN reaction causing immediate rash, facial/tongue/throat swelling, SOB or lightheadedness with hypotension: Yes Has patient had a PCN reaction causing severe rash involving mucus membranes or skin necrosis: No Has patient had a PCN reaction that required hospitalization No Has patient had a PCN reaction occurring within the last 10 years: No If all of the above answers are "NO", then may proceed with Cephalosporin use.    Polyethylene Glycol Other (See Comments)    Per allergy test   Spironolactone     Hyperkalemia   Sulfonamide Derivatives Nausea And Vomiting   Cymbalta [Duloxetine Hcl] Rash   Remeron [Mirtazapine] Other (See Comments)    Caused lots of strange, weird, crazy dreams.     Physical examination: Blood pressure (!) 150/78, pulse 81, temperature 97.9 F (36.6 C), resp. rate 18, weight 194 lb 2 oz (88.1 kg), SpO2 98 %.  General: Alert, interactive, in no acute distress. HEENT: PERRLA, TMs pearly gray, turbinates non-edematous without discharge, post-pharynx non erythematous. Neck: Supple without lymphadenopathy. Lungs: Clear to auscultation without wheezing, rhonchi  or rales. {no increased work of breathing. CV: Normal S1, S2 without murmurs. Abdomen: Nondistended, nontender. Skin: Warm and dry, without lesions or rashes. Extremities:  No clubbing, cyanosis or edema. Neuro:   Grossly intact.  Diagnositics/Labs: Labs:   Component     Latest Ref Rng 09/24/2022  WBC     3.4 - 10.8 x10E3/uL 5.7   RBC     3.77 - 5.28 x10E6/uL 5.28   Hemoglobin     11.1 - 15.9 g/dL 16.1   HCT     09.6 - 04.5 % 41.4   MCV     79 - 97 fL 78 (L)   MCH  26.6 - 33.0 pg 25.0 (L)   MCHC     31.5 - 35.7 g/dL 14.7   RDW     82.9 - 56.2 % 16.2 (H)   Platelets     150 - 450 x10E3/uL 252   Neutrophils     Not Estab. % 59   Lymphs     Not Estab. % 27   Monocytes     Not Estab. % 12   Eos     Not Estab. % 1   Basos     Not Estab. % 1   NEUT#     1.4 - 7.0 x10E3/uL 3.3   Lymphocyte #     0.7 - 3.1 x10E3/uL 1.5   Monocytes Absolute     0.1 - 0.9 x10E3/uL 0.7   EOS (ABSOLUTE)     0.0 - 0.4 x10E3/uL 0.1   Basophils Absolute     0.0 - 0.2 x10E3/uL 0.0   Immature Granulocytes     Not Estab. % 0   Immature Grans (Abs)     0.0 - 0.1 x10E3/uL 0.0   Class Description Allergens Comment   IgE (Immunoglobulin E), Serum     6 - 495 IU/mL 29   O215-IgE Alpha-Gal     Class 0 kU/L <0.10   Beef IgE     Class 0 kU/L <0.10   Pork IgE     Class 0 kU/L <0.10   Allergen Lamb IgE     Class 0 kU/L <0.10   Complement C3, Serum     82 - 167 mg/dL 130   Complement C4, Serum     12 - 38 mg/dL 26   TSH     8.657 - 8.469 uIU/mL 0.454   T4,Free(Direct)     0.82 - 1.77 ng/dL 6.29   Anti Nuclear Antibody (ANA)     Negative  Negative   Tryptase     2.2 - 13.2 ug/L 9.7   Complement C1Q     10.3 - 20.5 mg/dL 52.8   U1LKG MWNUUVOZDG/U4QIH Total MFr SerPl     %mean normal >110     Assessment and plan:   Rash/Pruritus (Itching) - The facial pruritus is likely related to the rash.   - Continue to follow up with Dermatology. This is not hives/eczema.   - Your  bloodwork was all normal and reassuring - Do a daily soaking tub bath in warm water for 10-15 minutes.  - Use a gentle, unscented cleanser at the end of the bath (such as Dove unscented bar or baby wash, or Aveeno sensitive body wash). Then rinse, pat half-way dry, and apply a gentle, unscented moisturizer cream or ointment (Cerave, Cetaphil, Eucerin, Aveeno)  all over while still damp. Dry skin makes the itching worse. The skin should be moisturized with a gentle, unscented moisturizer at least twice daily.  - Use only unscented liquid laundry detergent. - You can also try Sarna anti-itch lotion as needed.    Angioedema (Swelling) - Keep track of how often this is happening and how long it lasts. - Take pictures of this also. - As above your bloodwork was all reassuring - Would increase Cetirizine (Zyrtec) to 1 tab twice a day with Famotidine (Pepcid) 1 tab twice a day  Chronic Rhinitis: - Use nasal saline rinses before nose sprays such as with Neilmed Sinus Rinse.  Use distilled water.   - Use Flonase 2 sprays each nostril daily for nasal congestion/stuffy nose. Aim upward and outward. -  Use Azelastine 1-2 sprays each nostril twice daily as needed for runny nose, drainage, throat clearing, sneezing. Aim upward and outward. - Use Zyrtec 10 mg as above twice a day at this time - For itchy/watery eyes can use Pataday 1 drop each eye daily as needed  Follow-up with Dr Allena Katz in 3 months or sooner if needed  I appreciate the opportunity to take part in [facility] care. Please do not hesitate to contact me with questions.  Sincerely,   Margo Aye, MD Allergy/Immunology [facility]

## 2022-12-24 ENCOUNTER — Encounter: Payer: Self-pay | Admitting: *Deleted

## 2022-12-24 ENCOUNTER — Encounter: Payer: Self-pay | Admitting: Student

## 2022-12-24 ENCOUNTER — Ambulatory Visit: Payer: 59 | Attending: Student | Admitting: Student

## 2022-12-24 VITALS — BP 158/86 | HR 84 | Ht 62.0 in | Wt 191.2 lb

## 2022-12-24 DIAGNOSIS — E782 Mixed hyperlipidemia: Secondary | ICD-10-CM

## 2022-12-24 DIAGNOSIS — I10 Essential (primary) hypertension: Secondary | ICD-10-CM

## 2022-12-24 DIAGNOSIS — R0609 Other forms of dyspnea: Secondary | ICD-10-CM | POA: Diagnosis not present

## 2022-12-24 DIAGNOSIS — I5032 Chronic diastolic (congestive) heart failure: Secondary | ICD-10-CM

## 2022-12-24 MED ORDER — CARVEDILOL 3.125 MG PO TABS: 3.1250 mg | ORAL_TABLET | Freq: Two times a day (BID) | ORAL | 6 refills | Status: DC

## 2022-12-24 NOTE — Patient Instructions (Signed)
Medication Instructions:   Begin Coreg 3.125mg  twice a day   Continue all other medications.     Labwork:  none  Testing/Procedures:  Your physician has requested that you have a lexiscan myoview. For further information please visit [email or web address] Please follow instruction sheet, as given. Office will contact with results via phone, letter or mychart.     Follow-Up:  3-4 months   Any Other Special Instructions Will Be Listed Below (If Applicable).   If you need a refill on your cardiac medications before your next appointment, please call your pharmacy.

## 2022-12-24 NOTE — Progress Notes (Signed)
Cardiology Office Note    Date:  12/24/2022  ID:  [name], DOB 1945/07/05, MRN [id number] Cardiologist: Dina Rich, MD    History of Present Illness:    [name] is a 77 y.o. female with past medical history of HFpEF, palpitations, HTN and HLD who presents to the office today for 47-month follow-up.  She was examined by myself in 08/2022 and reported having baseline dyspnea on exertion but no chest pain or palpitations. BP had been elevated at home with SBP in the 150's to 160's. Hydralazine was titrated to 100 mg TID and she was encouraged to keep a BP log. Was recommended to consider the addition of Chlorthalidone if BP remained elevated as she previously had tongue swelling with Losartan which limited the use of ACE-I's or ARB's and was previously intolerant to beta-blocker therapies (Atenolol) due to fatigue. Had also been intolerant to Spironolactone due to hyperkalemia. It was recommended that if her dyspnea on exertion did not improve with BP control, would consider a Lexiscan Myoview to rule out an ischemic etiology.  In talking with the patient and her daughter today, she reports still having dyspnea on exertion which has not improved since her last office visit. She is open to pursuing a stress test at this time. She denies any associated chest pain or palpitations. No specific orthopnea or PND. She does have persistent lower extremity edema which has overall been unchanged. Reports her BP has been elevated at home despite taking Hydralazine three times daily.   Studies Reviewed:   EKG: EKG is not ordered today.  Echocardiogram: 08/2022 IMPRESSIONS     1. Left ventricular ejection fraction, by estimation, is 60 to 65%. The  left ventricle has normal function. The left ventricle has no regional  wall motion abnormalities. There is mild concentric left ventricular  hypertrophy. Left ventricular diastolic  parameters are indeterminate.   2. Right ventricular  systolic function is normal. The right ventricular  size is normal. There is normal pulmonary artery systolic pressure. The  estimated right ventricular systolic pressure is 26.8 mmHg.   3. Left atrial size was mildly dilated.   4. The mitral valve is grossly normal. Mild mitral valve regurgitation.   5. The aortic valve is tricuspid. There is mild calcification of the  aortic valve. Aortic valve regurgitation is mild.   6. The inferior vena cava is normal in size with greater than 50%  respiratory variability, suggesting right atrial pressure of 3 mmHg.   Comparison(s): Prior images reviewed side by side. LVEF stable at 60-65%.  Mild aortic regurgitation as before.    Physical Exam:   VS:  BP (!) 158/86   Pulse 84   Ht 5\' 2"  (1.575 m)   Wt 191 lb 3.2 oz (86.7 kg)   SpO2 98%   BMI 34.97 kg/m    Wt Readings from Last 3 Encounters:  12/24/22 191 lb 3.2 oz (86.7 kg)  12/23/22 194 lb 2 oz (88.1 kg)  11/25/22 190 lb (86.2 kg)     GEN: Pleasant, elderly female appearing in no acute distress NECK: No JVD; No carotid bruits CARDIAC: RRR, no murmurs, rubs, gallops RESPIRATORY:  Clear to auscultation without rales, wheezing or rhonchi  ABDOMEN: Appears non-distended. No obvious abdominal masses. EXTREMITIES: No clubbing or cyanosis. Chronic appearing edema.  Distal pedal pulses are 2+ bilaterally.   Assessment and Plan:   1. HFpEF/Dyspnea on Exertion - Echo in 08/2022 showed a preserved EF of 60 to 65% with  no wall motion abnormalities. RV function was normal as well with normal PASP. She has chronic appearing edema on examination but lungs are clear. Will continue current diuretic therapy with Lasix 40 mg daily. - Given her persistent dyspnea on exertion, she is open to pursuing a Lexiscan Myoview for ischemic evaluation as previously discussed and we will have this arranged.  2. HTN - Her blood pressure is elevated at 158/86 during today's visit and has been elevated when checked  at home. She is currently on Amlodipine 10 mg daily and Hydralazine 100 mg 3 times daily. She previously experienced tongue swelling with Losartan which limits the use of ACE-I, ARB's or ARNI. Had hyperkalemia with Spironolactone. We reviewed the possible addition of Chlorthalidone but she wishes to avoid additional diuretic therapy given Lasix 40 mg daily and it would not be ideal to be on dual-diuretic therapy. Therefore, we will try Coreg but at a low dose of 3.125 mg twice daily (previously had fatigue with Atenolol). I encouraged her to keep a BP log and report back with readings in several weeks. If this remains above goal, can further titrate Coreg.   3. HLD - LDL was at 87 in 06/2022. Continue current medical therapy with Lovastatin 40 mg daily.   Signed, Ellsworth Lennox, PA-C

## 2023-01-02 ENCOUNTER — Encounter (HOSPITAL_COMMUNITY): Payer: Self-pay

## 2023-01-02 ENCOUNTER — Ambulatory Visit (HOSPITAL_COMMUNITY)
Admission: RE | Admit: 2023-01-02 | Discharge: 2023-01-02 | Disposition: A | Discharge status: Home / Self Care | Payer: 59 | Source: Ambulatory Visit | Attending: Cardiology | Admitting: Cardiology

## 2023-01-02 ENCOUNTER — Encounter (HOSPITAL_COMMUNITY)
Admission: RE | Admit: 2023-01-02 | Discharge: 2023-01-02 | Disposition: A | Discharge status: Home / Self Care | Payer: 59 | Source: Ambulatory Visit | Attending: Student | Admitting: Student

## 2023-01-02 DIAGNOSIS — R0609 Other forms of dyspnea: Secondary | ICD-10-CM | POA: Diagnosis not present

## 2023-01-02 HISTORY — DX: Systemic involvement of connective tissue, unspecified: M35.9

## 2023-01-02 HISTORY — DX: Disorder of kidney and ureter, unspecified: N28.9

## 2023-01-02 LAB — NM MYOCAR MULTI W/SPECT W/WALL MOTION / EF
LV dias vol: 82 mL (ref 46–106)
LV sys vol: 28 mL
Nuc Stress EF: 66 %
Peak HR: 98 {beats}/min
RATE: 0.4
Rest HR: 70 {beats}/min
Rest Nuclear Isotope Dose: 11 mCi
SDS: 0
SRS: 0
SSS: 0
ST Depression (mm): 0 mm
Stress Nuclear Isotope Dose: 32.8 mCi
TID: 1.02

## 2023-01-02 MED ORDER — TECHNETIUM TC 99M TETROFOSMIN IV KIT
30.0000 | PACK | Freq: Once | INTRAVENOUS | Status: AC | PRN
  Administered 2023-01-02: 32.8 via INTRAVENOUS

## 2023-01-02 MED ORDER — TECHNETIUM TC 99M TETROFOSMIN IV KIT
10.0000 | PACK | Freq: Once | INTRAVENOUS | Status: AC | PRN
  Administered 2023-01-02: 11 via INTRAVENOUS

## 2023-01-02 MED ORDER — SODIUM CHLORIDE FLUSH 0.9 % IV SOLN
INTRAVENOUS | Status: AC
  Administered 2023-01-02: 10 mL via INTRAVENOUS
  Filled 2023-01-02: qty 10

## 2023-01-02 MED ORDER — REGADENOSON 0.4 MG/5ML IV SOLN
INTRAVENOUS | Status: AC
  Administered 2023-01-02: 0.4 mg via INTRAVENOUS
  Filled 2023-01-02: qty 5

## 2023-01-08 ENCOUNTER — Encounter (HOSPITAL_COMMUNITY): Payer: Self-pay | Admitting: Internal Medicine

## 2023-01-08 DIAGNOSIS — Z1231 Encounter for screening mammogram for malignant neoplasm of breast: Secondary | ICD-10-CM

## 2023-01-10 ENCOUNTER — Telehealth: Payer: Self-pay

## 2023-01-10 MED ORDER — AZELASTINE HCL 0.1 % NA SOLN: NASAL | 5 refills | Status: DC

## 2023-01-10 NOTE — Telephone Encounter (Signed)
Patient called requesting a refill on azelastine.   [facility]

## 2023-01-10 NOTE — Telephone Encounter (Signed)
Refill sent and lvm informing patient

## 2023-01-13 ENCOUNTER — Ambulatory Visit (INDEPENDENT_AMBULATORY_CARE_PROVIDER_SITE_OTHER): Payer: 59 | Admitting: Orthopedic Surgery

## 2023-01-13 DIAGNOSIS — M7662 Achilles tendinitis, left leg: Secondary | ICD-10-CM | POA: Diagnosis not present

## 2023-01-13 NOTE — Progress Notes (Signed)
Chief Complaint  Patient presents with   Follow-up    Recheck on left foot   [name] comes back for recheck on her left foot she is having pain in the Achilles area and in the arch but the arch pain is more distal towards the metatarsal heads  These are areas of tenderness.  Achilles tendon is intact but tender to touch to the plantar aspect of the foot near the metatarsal heads is also tender as well  Recommend cam walker for 6 weeks and recheck

## 2023-01-22 DIAGNOSIS — N1831 Chronic kidney disease, stage 3a: Secondary | ICD-10-CM | POA: Diagnosis not present

## 2023-01-27 ENCOUNTER — Other Ambulatory Visit: Payer: Self-pay | Admitting: Psychiatry

## 2023-01-27 DIAGNOSIS — N1831 Chronic kidney disease, stage 3a: Secondary | ICD-10-CM | POA: Diagnosis not present

## 2023-01-27 NOTE — Telephone Encounter (Signed)
Pt last seen on 05/08/22 Follow up scheduled on 05/19/22

## 2023-01-28 ENCOUNTER — Encounter: Payer: Self-pay | Admitting: Internal Medicine

## 2023-01-28 ENCOUNTER — Other Ambulatory Visit (HOSPITAL_COMMUNITY): Payer: Self-pay | Admitting: Internal Medicine

## 2023-01-28 ENCOUNTER — Encounter (HOSPITAL_COMMUNITY): Payer: Self-pay | Admitting: Internal Medicine

## 2023-01-28 DIAGNOSIS — L299 Pruritus, unspecified: Secondary | ICD-10-CM | POA: Diagnosis not present

## 2023-01-28 DIAGNOSIS — R609 Edema, unspecified: Secondary | ICD-10-CM | POA: Diagnosis not present

## 2023-01-28 DIAGNOSIS — I5030 Unspecified diastolic (congestive) heart failure: Secondary | ICD-10-CM | POA: Diagnosis not present

## 2023-01-28 DIAGNOSIS — N1831 Chronic kidney disease, stage 3a: Secondary | ICD-10-CM | POA: Diagnosis not present

## 2023-01-28 DIAGNOSIS — I130 Hypertensive heart and chronic kidney disease with heart failure and stage 1 through stage 4 chronic kidney disease, or unspecified chronic kidney disease: Secondary | ICD-10-CM | POA: Diagnosis not present

## 2023-01-28 DIAGNOSIS — G43909 Migraine, unspecified, not intractable, without status migrainosus: Secondary | ICD-10-CM | POA: Diagnosis not present

## 2023-01-28 DIAGNOSIS — J310 Chronic rhinitis: Secondary | ICD-10-CM | POA: Diagnosis not present

## 2023-01-28 DIAGNOSIS — E876 Hypokalemia: Secondary | ICD-10-CM | POA: Diagnosis not present

## 2023-01-28 DIAGNOSIS — K219 Gastro-esophageal reflux disease without esophagitis: Secondary | ICD-10-CM | POA: Diagnosis not present

## 2023-01-28 DIAGNOSIS — I10 Essential (primary) hypertension: Secondary | ICD-10-CM | POA: Diagnosis not present

## 2023-01-28 DIAGNOSIS — R0609 Other forms of dyspnea: Secondary | ICD-10-CM | POA: Diagnosis not present

## 2023-01-28 DIAGNOSIS — Z139 Encounter for screening, unspecified: Secondary | ICD-10-CM

## 2023-01-28 DIAGNOSIS — Z8679 Personal history of other diseases of the circulatory system: Secondary | ICD-10-CM | POA: Diagnosis not present

## 2023-02-03 ENCOUNTER — Other Ambulatory Visit: Payer: Self-pay | Admitting: Internal Medicine

## 2023-02-17 ENCOUNTER — Ambulatory Visit (HOSPITAL_COMMUNITY)
Admission: RE | Admit: 2023-02-17 | Discharge: 2023-02-17 | Disposition: A | Discharge status: Home / Self Care | Payer: 59 | Source: Ambulatory Visit | Attending: Internal Medicine | Admitting: Internal Medicine

## 2023-02-17 DIAGNOSIS — Z1231 Encounter for screening mammogram for malignant neoplasm of breast: Secondary | ICD-10-CM | POA: Insufficient documentation

## 2023-02-17 DIAGNOSIS — Z139 Encounter for screening, unspecified: Secondary | ICD-10-CM | POA: Insufficient documentation

## 2023-02-18 ENCOUNTER — Other Ambulatory Visit: Payer: Self-pay | Admitting: Orthopedic Surgery

## 2023-02-18 DIAGNOSIS — M62830 Muscle spasm of back: Secondary | ICD-10-CM

## 2023-02-18 DIAGNOSIS — M898X1 Other specified disorders of bone, shoulder: Secondary | ICD-10-CM

## 2023-02-27 ENCOUNTER — Ambulatory Visit (INDEPENDENT_AMBULATORY_CARE_PROVIDER_SITE_OTHER): Payer: 59 | Admitting: Orthopedic Surgery

## 2023-02-27 VITALS — BP 147/91 | HR 74 | Ht 62.0 in | Wt 191.0 lb

## 2023-02-27 DIAGNOSIS — M79672 Pain in left foot: Secondary | ICD-10-CM

## 2023-02-27 DIAGNOSIS — M7662 Achilles tendinitis, left leg: Secondary | ICD-10-CM

## 2023-02-27 DIAGNOSIS — M722 Plantar fascial fibromatosis: Secondary | ICD-10-CM

## 2023-02-27 MED ORDER — METHYLPREDNISOLONE ACETATE 40 MG/ML IJ SUSP
40.0000 mg | Freq: Once | INTRAMUSCULAR | Status: AC
Start: 2023-02-27 — End: 2023-02-27
  Administered 2023-02-27: 40 mg via INTRA_ARTICULAR

## 2023-02-27 NOTE — Progress Notes (Addendum)
Chief Complaint  Patient presents with   Follow-up    Recheck on left foot     VISIT TYPE: FOLLOW UP   Chief Complaint  Patient presents with   Follow-up    Recheck on left foot    Encounter Diagnoses  Name Primary?   Pain of left heel    Achilles tendinitis, left leg    Plantar fasciitis Yes    Assessment and Plan: 77 years old treating for Achilles tendinitis plantar fasciitis and some metatarsalgia and plantar foot pain.  Unclear actual diagnosis  Having a lot of plantar heel pain today so we injected it  Continue CAM Walker follow-up in 4 weeks   Prior treatment:  CAM WALKER for 6 weeks +  AND Recommend sneakers with supportive shoe wear and anti-inflammatories return in 6 weeks       Meds ordered this encounter  Medications   meloxicam (MOBIC) 7.5 MG tablet      Sig: Take 1 tablet (7.5 mg total) by mouth daily.      Dispense:  30 tablet      Refill:  5   Procedure note Inject plantar fascia    Timeout was completed to confirm the site of injection left heel  The medications used were 40 mg of Depo-Medrol and 1% lidocaine 3 cc  Anesthesia was provided by ethyl chloride and the skin was prepped with alcohol.  After cleaning the skin with alcohol a 25-gauge needle was used to inject the plantar fascia, no complications were noted sterile bandage was applied   HPI: She still having significant pain in the heel radiating up into the back of the Achilles and leg and then some plantar arch pain as well  77 year old female no trauma to the left foot has pain in the back of the heel and bottom of the heel and some first step start up pain   Exam shows tenderness in the plantar fascia and the Achilles tendon but both seem mild to me range of motion is normal ankle is stable pulses are good color is normal   X-rays There is a small calcification in the Achilles tendon area and a small plantar bone spur     BP (!) 147/91   Pulse 74   Ht 5\' 2"  (1.575 m)   Wt  191 lb (86.6 kg)   BMI 34.93 kg/m   Ortho Exam The Achilles is intact there is no nodularity there are no gaps there is no bursal swelling most of the pain today is in the plantar arch and the calcaneus  Imaging see previous notes  A/P Encounter Diagnoses  Name Primary?   Pain of left heel    Achilles tendinitis, left leg    Plantar fasciitis Yes    Meds ordered this encounter  Medications   methylPREDNISolone acetate (DEPO-MEDROL) injection 40 mg

## 2023-03-04 DIAGNOSIS — M7662 Achilles tendinitis, left leg: Secondary | ICD-10-CM | POA: Diagnosis not present

## 2023-03-04 DIAGNOSIS — Z79899 Other long term (current) drug therapy: Secondary | ICD-10-CM | POA: Diagnosis not present

## 2023-03-04 DIAGNOSIS — I10 Essential (primary) hypertension: Secondary | ICD-10-CM | POA: Diagnosis not present

## 2023-03-04 DIAGNOSIS — G4700 Insomnia, unspecified: Secondary | ICD-10-CM | POA: Diagnosis not present

## 2023-03-04 DIAGNOSIS — Z713 Dietary counseling and surveillance: Secondary | ICD-10-CM | POA: Diagnosis not present

## 2023-03-12 ENCOUNTER — Encounter: Payer: Self-pay | Admitting: Gastroenterology

## 2023-03-27 ENCOUNTER — Ambulatory Visit: Payer: 59 | Admitting: Orthopedic Surgery

## 2023-03-27 ENCOUNTER — Encounter: Payer: Self-pay | Admitting: Orthopedic Surgery

## 2023-03-27 VITALS — BP 164/102 | HR 73

## 2023-03-27 DIAGNOSIS — M722 Plantar fascial fibromatosis: Secondary | ICD-10-CM | POA: Diagnosis not present

## 2023-03-27 NOTE — Progress Notes (Signed)
follow-up visit for plantar fasciitis of the left foot  Encounter Diagnosis  Name Primary?   Plantar fasciitis Yes   Chief complaint pain left heel  The patient has had an injection she has been wearing a cam walker she is anxious to get out of it  Today's exam shows no pain in the Achilles.  Milder tenderness in the plantar fascia.  I think she will do well with plantar fascia exercises emphasized icing with a frozen water bottle  She can remove the cam walker  Follow-up in 6 weeks after doing the exercises

## 2023-03-31 ENCOUNTER — Other Ambulatory Visit: Payer: Self-pay

## 2023-03-31 ENCOUNTER — Encounter: Payer: Self-pay | Admitting: Internal Medicine

## 2023-03-31 ENCOUNTER — Ambulatory Visit (INDEPENDENT_AMBULATORY_CARE_PROVIDER_SITE_OTHER): Payer: 59 | Admitting: Internal Medicine

## 2023-03-31 VITALS — BP 118/84 | HR 92 | Temp 98.2°F | Resp 18 | Wt 186.1 lb

## 2023-03-31 DIAGNOSIS — J310 Chronic rhinitis: Secondary | ICD-10-CM

## 2023-03-31 DIAGNOSIS — T783XXD Angioneurotic edema, subsequent encounter: Secondary | ICD-10-CM | POA: Diagnosis not present

## 2023-03-31 DIAGNOSIS — L299 Pruritus, unspecified: Secondary | ICD-10-CM

## 2023-03-31 DIAGNOSIS — L2989 Other pruritus: Secondary | ICD-10-CM

## 2023-03-31 NOTE — Progress Notes (Signed)
FOLLOW UP Date of Service/Encounter:  03/31/23   Subjective:  [name] (DOB: [birth year]) is a 77 y.o. female who [facility] for follow up for pruritus/rashes, angioedema and chronic rhinitis.   History obtained from: chart review and patient. Last visit was on 12/23/2022 with Dr. Delorse Lek and at the time, was still having swelling episodes.  Discussed use of Zyrtec/Pepcid.  Also on Flonase/Azelastine for rhinitis.  For pruritus/rash, discussed Dermatology follow up.   Since last visit, she reports persistent itching and some outbreaks on face.  Was seeing Dermatology Dr Charlton Haws but has not called to schedule a follow up.    Still reports feeling like her tongue swells intermittently.  Not sure how often.  Did not take pictures.  Lasts about a day.  Takes Zyrtec/Pepcid PRN.   Also reports having persistent drainage, runny nose, congestion.  Not sure which nose sprays she takes.  Taking Zyrtec PRN.   Past Medical History: Past Medical History:  Diagnosis Date   Adenomatous colon polyp 2006   excised in 2006 & 2010Due surveillance 06/2013   Anxiety    Anxiety and depression    Asthma    Breast mass    right nipple bengn mass per patient   Breast tumor    Chest discomfort    Chronic back pain    Chronic constipation    Collagen vascular disease (HCC)    Complication of anesthesia    Depression    Diverticulosis    Fibromyalgia    Gastroesophageal reflux disease    Hiatal hernia    History of benign esophageal tumor 2010   granular cell esophageal tumor (Dx 06/2008), resected via EMR 2011, due repeat EGD 02/2012   Hyperlipidemia    Hypertension    Insomnia    Migraines    PONV (postoperative nausea and vomiting)    Psychosis (HCC)    Renal insufficiency    Sleep apnea    Stop Bang score of 5. Pt said she was told by Dr. Gerilyn Pilgrim that she had "a little bit" of sleep apena, but not bad enough to treat.   Thyroid nodule     Tumor of esophagus     Objective:  BP 118/84   Pulse 92   Temp 98.2 F (36.8 C)   Resp 18   Wt 186 lb 2 oz (84.4 kg)   SpO2 96%   BMI 34.04 kg/m  Body mass index is 34.04 kg/m. Physical Exam: GEN: alert, well developed HEENT: clear conjunctiva, nose with mild inferior turbinate hypertrophy, pink nasal mucosa, + clear rhinorrhea, no cobblestoning HEART: regular rate and rhythm, no murmur LUNGS: clear to auscultation bilaterally, no coughing, unlabored respiration SKIN: no apparent rashes   Assessment:   1. Chronic rhinitis   2. Angioedema, subsequent encounter   3. Pruritus     Plan/Recommendations:  Rash/Pruritus (Itching) - Follow up with Dermatology- Dr Margo Aye or Dr Charlton Haws.  Please follow up with them- call to set up an appointment.  - Do a daily soaking tub bath in warm water for 10-15 minutes.  - Use a gentle, unscented cleanser at the end of the bath (such as Dove unscented bar or baby wash, or Aveeno sensitive body wash). Then rinse, pat half-way dry, and apply a gentle, unscented moisturizer cream or ointment (Cerave, Cetaphil, Eucerin, Aveeno)  all over while still damp. Dry skin makes the itching worse. The skin should be moisturized with a gentle, unscented moisturizer at  least twice daily.  - Use only unscented liquid laundry detergent. - You can also try Sarna anti-itch lotion as needed.    Idiopathic Angioedema (Swelling) - Keep track of episodes and take pictures of when it is swollen vs normal.  - 09/2022: normal C1 esterase function, TSH, tryptase, complements, ANA, alpha gal.  - At this time etiology of swelling is unknown. Swelling can be caused by a variety of different triggers including illness/infection, exercise, pressure, vibrations, extremes of temperature to name a few however majority of the time there is no identifiable trigger.  -Continue Zyrtec 10mg  daily and Pepcid 20mg  daily.  If symptoms worsen, increase to Zyrtec 10mg  twice daily and Pepcid  20mg  twice daily.   Chronic Rhinitis: - Positive skin test 08/2022: none - Use nasal saline rinses before nose sprays such as with Neilmed Sinus Rinse.  Use distilled water.   - Use Flonase 2 sprays each nostril daily. Aim upward and outward. - Use Azelastine 1-2 sprays each nostril twice daily as needed for runny nose, congestion, drainage, sneezing. Aim upward and outward. - Use Zyrtec 10 mg daily.     Return in about 6 months (around 09/29/2023).  Alesia Morin, MD [facility]

## 2023-03-31 NOTE — Patient Instructions (Addendum)
Rash/Pruritus (Itching) - Follow up with Dermatology- Dr Margo Aye or Dr Charlton Haws.  Please follow up with them- call to set up an appointment.  - Do a daily soaking tub bath in warm water for 10-15 minutes.  - Use a gentle, unscented cleanser at the end of the bath (such as Dove unscented bar or baby wash, or Aveeno sensitive body wash). Then rinse, pat half-way dry, and apply a gentle, unscented moisturizer cream or ointment (Cerave, Cetaphil, Eucerin, Aveeno)  all over while still damp. Dry skin makes the itching worse. The skin should be moisturized with a gentle, unscented moisturizer at least twice daily.  - Use only unscented liquid laundry detergent. - You can also try Sarna anti-itch lotion as needed.    Idiopathic Angioedema (Swelling) - Keep track of episodes and take pictures of when it is swollen vs normal.  - At this time etiology of swelling is unknown. Swelling can be caused by a variety of different triggers including illness/infection, exercise, pressure, vibrations, extremes of temperature to name a few however majority of the time there is no identifiable trigger.  -Continue Zyrtec 10mg  daily and Pepcid 20mg  daily.  If symptoms worsen, increase to Zyrtec 10mg  twice daily and Pepcid 20mg  twice daily.   Chronic Rhinitis: - Positive skin test 08/2022: none - Use nasal saline rinses before nose sprays such as with Neilmed Sinus Rinse.  Use distilled water.   - Use Flonase 2 sprays each nostril daily. Aim upward and outward. - Use Azelastine 1-2 sprays each nostril twice daily as needed for runny nose, congestion, drainage, sneezing. Aim upward and outward. - Use Zyrtec 10 mg daily.

## 2023-04-12 ENCOUNTER — Other Ambulatory Visit: Payer: Self-pay | Admitting: Gastroenterology

## 2023-04-12 DIAGNOSIS — K219 Gastro-esophageal reflux disease without esophagitis: Secondary | ICD-10-CM

## 2023-04-17 ENCOUNTER — Ambulatory Visit: Payer: 59 | Attending: Cardiology | Admitting: Cardiology

## 2023-04-17 ENCOUNTER — Encounter: Payer: Self-pay | Admitting: Cardiology

## 2023-04-17 VITALS — BP 132/80 | HR 53 | Ht 62.0 in | Wt 182.0 lb

## 2023-04-17 DIAGNOSIS — E782 Mixed hyperlipidemia: Secondary | ICD-10-CM | POA: Diagnosis not present

## 2023-04-17 DIAGNOSIS — R001 Bradycardia, unspecified: Secondary | ICD-10-CM | POA: Diagnosis not present

## 2023-04-17 DIAGNOSIS — I10 Essential (primary) hypertension: Secondary | ICD-10-CM | POA: Diagnosis not present

## 2023-04-17 DIAGNOSIS — R0609 Other forms of dyspnea: Secondary | ICD-10-CM

## 2023-04-17 DIAGNOSIS — I5032 Chronic diastolic (congestive) heart failure: Secondary | ICD-10-CM | POA: Diagnosis not present

## 2023-04-17 NOTE — Patient Instructions (Signed)

## 2023-04-17 NOTE — Progress Notes (Signed)
Clinical Summary [name] is a 77 y.o.female seen today for follow up of the following medical problems.    1. HTN - not taking atenolol, has some fatigue she attributes to it -tongue swelling on losartan.  -hyperkalemia on aldaconte - started coreg 3.125mg  bid last visit, no side effects      2. Chronic diastolic heart failure - echo 06/6107 LVEF 60-65%, abnormal diastolic function indeterminate grade.     12/2020 LVEF 60-65%, indet diastolic  08/2022 echo: LVEF 60-65%, no WMAs, indet diastolic fnx, normal RV, PASP 27, mild LAE   - no recent edema. Taking lasix 40mg  daily.  - some ongoing SOB that is chronic    3.Hyperlipidemia - 03/2021 TC 148 TG 96 HDL 54 LDL 76 - 12/2022 TC 604 TG 52 HDL 64 LDL 83    4.DOE - 08/2022 echo: LVEF 60-65%, no WMAs, indet diastolic fnx, normal RV, PASP 27, mild LAE 12/2022 nuclear stress: no ischemia Chronic cough, nonproductive. No wheezing.  - on inhalers for asthma.  - sedentary lifestyle   Past Medical History:  Diagnosis Date   Adenomatous colon polyp 2006   excised in 2006 & 2010Due surveillance 06/2013   Anxiety    Anxiety and depression    Asthma    Breast mass    right nipple bengn mass per patient   Breast tumor    Chest discomfort    Chronic back pain    Chronic constipation    Collagen vascular disease (HCC)    Complication of anesthesia    Depression    Diverticulosis    Fibromyalgia    Gastroesophageal reflux disease    Hiatal hernia    History of benign esophageal tumor 2010   granular cell esophageal tumor (Dx 06/2008), resected via EMR 2011, due repeat EGD 02/2012   Hyperlipidemia    Hypertension    Insomnia    Migraines    PONV (postoperative nausea and vomiting)    Psychosis (HCC)    Renal insufficiency    Sleep apnea    Stop Bang score of 5. Pt said she was told by Dr. Gerilyn Pilgrim that she had "a little bit" of sleep apena, but not bad enough to treat.   Thyroid nodule    Tumor of esophagus       Allergies  Allergen Reactions   Elavil [Amitriptyline] Other (See Comments)    Felt really nervous and felt like something was hold her feet or arm and/ or AM headache on it and back on it and went away off it.    Abilify [Aripiprazole] Other (See Comments)    Dystonic reaction   Codeine Hives, Nausea Only and Other (See Comments)    Feels funny   Latex Hives   Losartan     Tongue Swelling   Penicillins Hives, Itching and Other (See Comments)    Has patient had a PCN reaction causing immediate rash, facial/tongue/throat swelling, SOB or lightheadedness with hypotension: Yes Has patient had a PCN reaction causing severe rash involving mucus membranes or skin necrosis: No Has patient had a PCN reaction that required hospitalization No Has patient had a PCN reaction occurring within the last 10 years: No If all of the above answers are "NO", then may proceed with Cephalosporin use.    Polyethylene Glycol Other (See Comments)    Per allergy test   Spironolactone     Hyperkalemia   Sulfonamide Derivatives Nausea And Vomiting   Cymbalta [Duloxetine Hcl] Rash  Remeron [Mirtazapine] Other (See Comments)    Caused lots of strange, weird, crazy dreams.     Current Outpatient Medications  Medication Sig Dispense Refill   albuterol (PROVENTIL HFA;VENTOLIN HFA) 108 (90 BASE) MCG/ACT inhaler Inhale 2 puffs into the lungs every 6 (six) hours as needed for wheezing or shortness of breath.      amLODipine (NORVASC) 10 MG tablet Take 10 mg by mouth daily.     AUVELITY 45-105 MG TBCR Take 1 tablet twice a day by oral route.     azelastine (ASTELIN) 0.1 % nasal spray 1-2 sprays each nostril twice daily as needed 30 mL 5   Azelastine-Fluticasone 137-50 MCG/ACT SUSP      Calcium Carbonate-Vit D-Min (CALCIUM 1200 PO) Take by mouth daily.     carvedilol (COREG) 3.125 MG tablet Take 1 tablet (3.125 mg total) by mouth 2 (two) times daily with a meal. 60 tablet 6   cetirizine (ZYRTEC) 10 MG  tablet TAKE ONE TABLET (10MG  TOTAL0 BY MOUTH DAILY AS NEEDED FOR ALLERGIES (ITCHING) 30 tablet 5   Cyanocobalamin (VITAMIN B12 PO) Take by mouth.     famotidine (PEPCID) 20 MG tablet TAKE TWO TABLETS (40MG  TOTAL) BY MOUTH AT BEDTIME     Ferrous Sulfate (IRON PO) Take by mouth daily.     fluorometholone (FML) 0.1 % ophthalmic suspension SMARTSIG:In Eye(s)     fluticasone (FLONASE) 50 MCG/ACT nasal spray Place 2 sprays into both nostrils daily. 16 g 5   fluticasone 0.05%-ketoconazole 2% 1:1 cream mixture Apply topically.     furosemide (LASIX) 40 MG tablet Take 1 tablet (40 mg total) by mouth daily. 90 tablet 3   hydrALAZINE (APRESOLINE) 100 MG tablet Take 1 tablet (100 mg total) by mouth 3 (three) times daily. 270 tablet 1   hydrOXYzine (ATARAX) 25 MG tablet      levothyroxine (SYNTHROID) 25 MCG tablet Take 25 mcg by mouth daily.     lovastatin (MEVACOR) 40 MG tablet Take 40 mg by mouth at bedtime.     meloxicam (MOBIC) 7.5 MG tablet Take 1 tablet (7.5 mg total) by mouth daily. 30 tablet 5   methocarbamol (ROBAXIN) 500 MG tablet TAKE ONE (1) TABLET BY MOUTH 3 TIMES DAILY (Patient not taking: Reported on 03/31/2023) 60 tablet 1   Multiple Vitamins-Minerals (CENTRUM SILVER 50+WOMEN PO) Take by mouth daily.     pantoprazole (PROTONIX) 40 MG tablet TAKE ONE TABLET (40MG  TOTAL) BY MOUTH TWO TIMES DAILY 60 tablet 5   potassium chloride SA (KLOR-CON M) 20 MEQ tablet Take 1 tablet (20 mEq total) by mouth daily.     RESTASIS 0.05 % ophthalmic emulsion 1 drop 2 (two) times daily.     triamcinolone cream (KENALOG) 0.1 % Apply topically 2 (two) times daily.     Ubrogepant (UBRELVY) 100 MG TABS TAKE ONE TABLET (100MG ) BY MOUTH AS NEEDED FOR MIGRAINES. MAY REPEAT A DOSE IN 2 HOURS IF HEADACHE PERSISTS. MAX DOSE 2 TABLETS IN 24 HOURS. 16 tablet 3   No current facility-administered medications for this visit.     Past Surgical History:  Procedure Laterality Date   ABDOMINAL HYSTERECTOMY     BALLOON  DILATION  08/23/2021   Procedure: BALLOON DILATION;  Surgeon: Lanelle Bal, DO;  [facility];  Service: Endoscopy;;   BIOPSY  10/14/2019   Procedure: BIOPSY;  Surgeon: Corbin Ade, MD;  Location: [facility];  Service: Endoscopy;;   BRAVO PH STUDY  12/11/2011   Procedure: [facility]  STUDY;  Surgeon: Corbin Ade, MD;  Location: [facility];  Service: Endoscopy;  Laterality: N/A;   BREAST EXCISIONAL BIOPSY  1990s, 2012   Left x2-sclerosing ductal papilloma-2012   CHOLECYSTECTOMY N/A 04/09/2013   Procedure: LAPAROSCOPIC CHOLECYSTECTOMY;  Surgeon: Dalia Heading, MD;  Location: [facility];  Service: General;  Laterality: N/A;   COLONOSCOPY  06/2008, 06/2011   sigmoid tics, tubular adenoma; 2013: anal canal hemorrhoids   COLONOSCOPY  07/10/2011   Anal canal hemorrhoids likely the cause of hematochezia in the setting of constipation; otherwise normal rectum ;submucosal  petechiae in left colon of doubtful clinical significance; otherwise, normal colon   COLONOSCOPY WITH PROPOFOL N/A 11/10/2017   cancelled in pre-op   COLONOSCOPY WITH PROPOFOL N/A 07/23/2018   Procedure: COLONOSCOPY WITH PROPOFOL;  Surgeon: Corbin Ade, MD; 1 tubular adenoma, diverticulosis in the sigmoid and descending colon, nonbleeding internal hemorrhoids.  No recommendations to repeat due to age.   ESOPHAGEAL BRUSHING  08/23/2021   Procedure: ESOPHAGEAL BRUSHING;  Surgeon: Lanelle Bal, DO;  [facility]: [facility];  Service: Endoscopy;;   ESOPHAGEAL DILATION N/A 05/08/2015   Procedure: ESOPHAGEAL DILATION;  Surgeon: Corbin Ade, MD;  Location: [facility];  Service: Endoscopy;  Laterality: N/AElease Hashimoto 54/56   ESOPHAGOGASTRODUODENOSCOPY  12/11/2011   [id number] Schatzki's ring; otherwise normal/Small hiatal hernia. Antral and bulbar erosions   ESOPHAGOGASTRODUODENOSCOPY (EGD) WITH PROPOFOL N/A 05/08/2015   Dr. Jena Gauss: mild erosive reflux esophagitis, non-critical Schatzki's ring s/p dilation.  Hiatal hernia.    ESOPHAGOGASTRODUODENOSCOPY (EGD) WITH PROPOFOL N/A 10/14/2019   Procedure: ESOPHAGOGASTRODUODENOSCOPY (EGD) WITH PROPOFOL;  Surgeon: Corbin Ade, MD;  Nonobstructing Schatzki ring, small hiatal hernia, abnormal gastric mucosa s/p biopsied, normal first and second portion of the duodenum.  Pathology with chronic inactive gastritis, no H. pylori.   ESOPHAGOGASTRODUODENOSCOPY (EGD) WITH PROPOFOL N/A 08/23/2021   Surgeon: Lanelle Bal, DO;   white speckled mucosa in the esophagus s/p cells obtained for cytology (KOH prep negative), medium-sized hiatal hernia, grade A reflux esophagitis, mild Schatzki's ring s/p dilation, normal examined stomach and duodenum.   EUS  08/2010   Dr [facility] with EGD. Retained food. No recurrent esophageal lesion, bx negative.   IR KYPHO THORACIC WITH BONE BIOPSY  10/17/2020   IR RADIOLOGIST EVAL & MGMT  09/12/2020   LUNG BIOPSY     negative   PARATHYROIDECTOMY  09/2015   [facility].    PARATHYROIDECTOMY     POLYPECTOMY  07/23/2018   Procedure: POLYPECTOMY;  Surgeon: Corbin Ade, MD;  [facility]: [facility];  Service: Endoscopy;;  colon   RIGHT OOPHORECTOMY     benign disease   SHOULDER ARTHROSCOPY     Right; bone spurs removed   TONSILLECTOMY     TOTAL KNEE ARTHROPLASTY  2002   Right; previous arthroscopic surgery     Allergies  Allergen Reactions   Elavil [Amitriptyline] Other (See Comments)    Felt really nervous and felt like something was hold her feet or arm and/ or AM headache on it and back on it and went away off it.    Abilify [Aripiprazole] Other (See Comments)    Dystonic reaction   Codeine Hives, Nausea Only and Other (See Comments)    Feels funny   Latex Hives   Losartan     Tongue Swelling   Penicillins Hives, Itching and Other (See Comments)    Has patient had a PCN reaction causing immediate rash, facial/tongue/throat swelling, SOB or lightheadedness with hypotension: Yes  Has patient had a PCN reaction  causing severe rash involving mucus membranes or skin necrosis: No Has patient had a PCN reaction that required hospitalization No Has patient had a PCN reaction occurring within the last 10 years: No If all of the above answers are "NO", then may proceed with Cephalosporin use.    Polyethylene Glycol Other (See Comments)    Per allergy test   Spironolactone     Hyperkalemia   Sulfonamide Derivatives Nausea And Vomiting   Cymbalta [Duloxetine Hcl] Rash   Remeron [Mirtazapine] Other (See Comments)    Caused lots of strange, weird, crazy dreams.      Family History  Problem Relation Age of Onset   Stroke Father    Alcohol abuse Father    Heart attack Mother    Depression Mother    Anxiety disorder Mother    Colon cancer Paternal Aunt    Colon cancer Paternal Uncle    Dementia Maternal Uncle    ADD / ADHD Neg Hx    Bipolar disorder Neg Hx    Drug abuse Neg Hx    OCD Neg Hx    Paranoid behavior Neg Hx    Schizophrenia Neg Hx    Seizures Neg Hx    Sexual abuse Neg Hx    Physical abuse Neg Hx      Social History Ms. Platek reports that she has never smoked. She has never used smokeless tobacco. Ms. Carvajal reports no history of alcohol use.   Review of Systems CONSTITUTIONAL: No weight loss, fever, chills, weakness or fatigue.  HEENT: Eyes: No visual loss, blurred vision, double vision or yellow sclerae.No hearing loss, sneezing, congestion, runny nose or sore throat.  SKIN: No rash or itching.  CARDIOVASCULAR: per hpi RESPIRATORY: per hpi GASTROINTESTINAL: No anorexia, nausea, vomiting or diarrhea. No abdominal pain or blood.  GENITOURINARY: No burning on urination, no polyuria NEUROLOGICAL: No headache, dizziness, syncope, paralysis, ataxia, numbness or tingling in the extremities. No change in bowel or bladder control.  MUSCULOSKELETAL: No muscle, back pain, joint pain or stiffness.  LYMPHATICS: No enlarged nodes. No history of splenectomy.  PSYCHIATRIC: No history  of depression or anxiety.  ENDOCRINOLOGIC: No reports of sweating, cold or heat intolerance. No polyuria or polydipsia.  Marland Kitchen   Physical Examination Today's Vitals   04/17/23 0914  BP: 132/80  Pulse: (!) 53  SpO2: 97%  Weight: 182 lb (82.6 kg)  Height: 5\' 2"  (1.575 m)   Body mass index is 33.29 kg/m.  Gen: resting comfortably, no acute distress HEENT: no scleral icterus, pupils equal round and reactive, no palptable cervical adenopathy,  CV: RRR, no m/rg, no jvd Resp: Clear to auscultation bilaterally GI: abdomen is soft, non-tender, non-distended, normal bowel sounds, no hepatosplenomegaly MSK: extremities are warm, no edema.  Skin: warm, no rash Neuro:  no focal deficits Psych: appropriate affect   Diagnostic Studies 02/17/13 Event monitor: baseline sinus brady in 50s, symptoms of dizziness correlate w/ sinus brady in 50s. Occasional PVCs, 1 PVC couplet.     01/2012 Carotid US: minimal plaque at bifurcations, no stenosis     10/2014 echo Study Conclusions  - Left ventricle: The cavity size was normal. Wall thickness was   increased in a pattern of mild LVH. Systolic function was normal.   The estimated ejection fraction was in the range of 60% to 65%.   Abnormal diastoilc function, indeterminate grade. - Aortic valve: There was mild regurgitation. Valve area (VTI):   2.48 cm^2. Valve area (  Vmax): 2.89 cm^2. - Technically adequate study.   08/2022 echo 1. Left ventricular ejection fraction, by estimation, is 60 to 65%. The  left ventricle has normal function. The left ventricle has no regional  wall motion abnormalities. There is mild concentric left ventricular  hypertrophy. Left ventricular diastolic  parameters are indeterminate.   2. Right ventricular systolic function is normal. The right ventricular  size is normal. There is normal pulmonary artery systolic pressure. The  estimated right ventricular systolic pressure is 26.8 mmHg.   3. Left atrial size was mildly  dilated.   4. The mitral valve is grossly normal. Mild mitral valve regurgitation.   5. The aortic valve is tricuspid. There is mild calcification of the  aortic valve. Aortic valve regurgitation is mild.   6. The inferior vena cava is normal in size with greater than 50%  respiratory variability, suggesting right atrial pressure of 3 mmHg.   12/2022 nuclear stress  Findings are consistent with no ischemia. The study is low risk.   No ST deviation was noted. Arrhythmias during stress: frequent PACs, rare PVCs. The ECG was negative for ischemia.   LV perfusion is normal.  No significant myocardial perfusion defects to indicate scar or ischemia.   Left ventricular function is normal. Nuclear stress EF: 66%.  Assessment and Plan  1. HTN - compliant with meds. Some side effects to prior agents as listed above - controlled on current regimen, continue current therapy - EKG today shows NSR at 67 with PACs, suspect dynamap was inaccurate reported 53 perhaps due to PACs. Tolerating coreg fine.      2. Chronic HFpEF -euvolemic today, continue lasix at current dosing.  - consider SGLT2i at f/u  3. HLD - at goal, continue current meds  4. DOE - no significant findings on echo and stress test this year, she is euvolemic today - could be related to asthma, inactivity/sedentary lifestyle. Encouraged increased physical activity.  - no plans for additional cardiac testing, extensive cardiac workup this year overall benign.      Antoine Poche, M.D.

## 2023-04-21 ENCOUNTER — Ambulatory Visit (INDEPENDENT_AMBULATORY_CARE_PROVIDER_SITE_OTHER): Payer: 59 | Admitting: Gastroenterology

## 2023-04-21 ENCOUNTER — Other Ambulatory Visit: Payer: Self-pay | Admitting: Cardiology

## 2023-04-21 ENCOUNTER — Encounter: Payer: Self-pay | Admitting: Gastroenterology

## 2023-04-21 VITALS — BP 135/83 | HR 59 | Temp 98.6°F | Ht 62.0 in | Wt 185.8 lb

## 2023-04-21 DIAGNOSIS — R220 Localized swelling, mass and lump, head: Secondary | ICD-10-CM

## 2023-04-21 DIAGNOSIS — K219 Gastro-esophageal reflux disease without esophagitis: Secondary | ICD-10-CM

## 2023-04-21 DIAGNOSIS — R1312 Dysphagia, oropharyngeal phase: Secondary | ICD-10-CM

## 2023-04-21 DIAGNOSIS — R1310 Dysphagia, unspecified: Secondary | ICD-10-CM | POA: Diagnosis not present

## 2023-04-21 DIAGNOSIS — K5909 Other constipation: Secondary | ICD-10-CM

## 2023-04-21 DIAGNOSIS — R1012 Left upper quadrant pain: Secondary | ICD-10-CM

## 2023-04-21 DIAGNOSIS — K5900 Constipation, unspecified: Secondary | ICD-10-CM | POA: Diagnosis not present

## 2023-04-21 NOTE — Progress Notes (Signed)
GI Office Note    Referring Provider: Benita Stabile, MD Primary Care Physician:  Benita Stabile, MD Primary Gastroenterologist: Gerrit Friends.Rourk, MD  Date:  04/21/2023  ID:  [name], DOB 01/29/1946, MRN [id number]   Chief Complaint   Chief Complaint  Patient presents with   Follow-up    Follow up on swallowing. Pt still having trouble   History of Present Illness  [name] is a 77 y.o. female with a history of opioid-induced constipation, adenomatous colon polyps, GERD, esophageal and oropharyngeal dysphagia, esophageal variceal tumor s/p EMR in 2011 at [facility] presenting today with complaint of   Last colonoscopy January 2020 with adenomatous colon polyps.  No repeat due to age.   History of esophageal dysphagia with EGD in April 2021 with Schatzki's ring s/p dilation.    History of oropharyngeal dysphagia demonstrated multiple prior modified barium swallow studies.    Also seen by Dr. Margaretha Glassing at [facility] for uncontrolled GERD in 2014, had multiple EGDs, normal GES, and tried various PPIs.  Esophageal manometry was attempted but she was unable to tolerate the prep.   Seen previously earlier this year for unintentional weight loss in 3-4 months of early satiety.  She underwent EGD in March 2023 during hospitalization for hypokalemia and AKI.  She had lites to include esophageal mucosa with cytology negative for yeast, medium sized hiatal hernia, grade a reflux esophagitis, Schatzki's ring s/p dilation and normal stomach and duodenum.  She subsequently underwent bedside swallow evaluation with speech therapy revealing oropharyngeal dysphagia with prolonged AP, uncoordinated with poor bolus cohesion, moderate oral residue after initial swallow.  She had very slow A-P transition with pures and suspected delayed swallow trigger without any signs of aspiration.  She was recommended following dysphagia 2/finely chopped meats.   Labs in March 2023 with negative stool studies, CBC and CMP  without significant abnormalities.  TSH low at 0.11.  Normal lipase.  His abdomen regarding issues with diarrhea patient is also having some epigastric pain as she had been off PPI due to confusion had to take medications at discharge.   Repeat modified barium swallow study 09/27/2021 with prolonged oral transit with solids with impairment normal movement resulting piecemeal duplication with timely swallow trigger, trace vallecular and lateral channel residues after liquids which clear with repeat swallow.  She delayed transit of barium tablet into the distal esophagus which eventually cleared with liquid wash.  At this point she is recommended to have any diet textures that she would like, just needs to allow more time and put forth more effort with swallowing.  No ongoing therapy recommended.  Last office visit 10/10/22.  Taking pantoprazole 40 mg twice daily and famotidine at bedtime with well-controlled symptoms.  Having some emesis post coughing episodes and had completed azithromycin mycin treatment with some improvement.  Denies any nausea.  Lack of appetite since being sick with some weight loss.  Swallowing stable.  She is not drinking Trulance but drinking 2 protein shakes daily which keeps her bowels moving.  Denied any melena or BRBPR.  She was advised to continue PPI and famotidine nightly.  GERD diet/lifestyle reinforced, continue protein drinks.  Follow-up in 6 months.    Today: Constipation: Doing well since being on the protein shakes. Going everyday. Nice soft bowel movements. No melena or brbpr. Intermittent abdominal pain that is random in the mid left abdomen and can last about an hour and goes away on its own.   GERD: Not bothering her  like she used to. No vomiting. Does have nausea but thinks that's her medicine.   Dysphagia: Stable, about the same. Appetite is good but still having trouble chewing and swallowing. She got new teeth and still having issues.  Still having issues  starting her swallowing. Doing small bites and taking sips behind it. Takes about an hour and a half to eat. Eating a variety of consistencies. Unable to eat rice, oatmeal, potatoes, eggs. Still doing the protein shakes 2/day. NO chest pain. Takes all of her medications whole. Sometimes they do get hung.    Wt Readings from Last 3 Encounters:  04/21/23 185 lb 12.8 oz (84.3 kg)  04/17/23 182 lb (82.6 kg)  03/31/23 186 lb 2 oz (84.4 kg)    Current Outpatient Medications  Medication Sig Dispense Refill   albuterol (PROVENTIL HFA;VENTOLIN HFA) 108 (90 BASE) MCG/ACT inhaler Inhale 2 puffs into the lungs every 6 (six) hours as needed for wheezing or shortness of breath.      amLODipine (NORVASC) 10 MG tablet Take 10 mg by mouth daily.     AUVELITY 45-105 MG TBCR Take 1 tablet twice a day by oral route.     azelastine (ASTELIN) 0.1 % nasal spray 1-2 sprays each nostril twice daily as needed 30 mL 5   Azelastine-Fluticasone 137-50 MCG/ACT SUSP      BREZTRI AEROSPHERE 160-9-4.8 MCG/ACT AERO Inhale into the lungs.     Calcium Carbonate-Vit D-Min (CALCIUM 1200 PO) Take by mouth daily.     carvedilol (COREG) 3.125 MG tablet Take 1 tablet (3.125 mg total) by mouth 2 (two) times daily with a meal. 60 tablet 6   cetirizine (ZYRTEC) 10 MG tablet TAKE ONE TABLET (10MG  TOTAL0 BY MOUTH DAILY AS NEEDED FOR ALLERGIES (ITCHING) 30 tablet 5   Cyanocobalamin (VITAMIN B12 PO) Take by mouth.     famotidine (PEPCID) 20 MG tablet TAKE TWO TABLETS (40MG  TOTAL) BY MOUTH AT BEDTIME     Ferrous Sulfate (IRON PO) Take by mouth daily.     fluorometholone (FML) 0.1 % ophthalmic suspension SMARTSIG:In Eye(s)     fluticasone (FLONASE) 50 MCG/ACT nasal spray Place 2 sprays into both nostrils daily. 16 g 5   fluticasone 0.05%-ketoconazole 2% 1:1 cream mixture Apply topically.     hydrALAZINE (APRESOLINE) 100 MG tablet Take 1 tablet (100 mg total) by mouth 3 (three) times daily. 270 tablet 1   levothyroxine (SYNTHROID) 25 MCG  tablet Take 25 mcg by mouth daily.     lovastatin (MEVACOR) 40 MG tablet Take 40 mg by mouth at bedtime.     Melatonin 3 MG CAPS Take by mouth.     Multiple Vitamins-Minerals (CENTRUM SILVER 50+WOMEN PO) Take by mouth daily.     pantoprazole (PROTONIX) 40 MG tablet TAKE ONE TABLET (40MG  TOTAL) BY MOUTH TWO TIMES DAILY 60 tablet 5   potassium chloride SA (KLOR-CON M) 20 MEQ tablet Take 1 tablet (20 mEq total) by mouth daily.     RESTASIS 0.05 % ophthalmic emulsion 1 drop 2 (two) times daily.     triamcinolone cream (KENALOG) 0.1 % Apply topically 2 (two) times daily.     Ubrogepant (UBRELVY) 100 MG TABS TAKE ONE TABLET (100MG ) BY MOUTH AS NEEDED FOR MIGRAINES. MAY REPEAT A DOSE IN 2 HOURS IF HEADACHE PERSISTS. MAX DOSE 2 TABLETS IN 24 HOURS. 16 tablet 3   furosemide (LASIX) 40 MG tablet Take 1 tablet (40 mg total) by mouth daily. 90 tablet 3   hydrOXYzine (ATARAX) 25 MG  tablet  (Patient not taking: Reported on 04/17/2023)     ketoconazole (NIZORAL) 2 % cream Apply topically 2 (two) times daily as needed. (Patient not taking: Reported on 04/17/2023)     meloxicam (MOBIC) 7.5 MG tablet Take 1 tablet (7.5 mg total) by mouth daily. (Patient not taking: Reported on 04/17/2023) 30 tablet 5   methocarbamol (ROBAXIN) 500 MG tablet TAKE ONE (1) TABLET BY MOUTH 3 TIMES DAILY (Patient not taking: Reported on 03/31/2023) 60 tablet 1   No current facility-administered medications for this visit.    Past Medical History:  Diagnosis Date   Adenomatous colon polyp 2006   excised in 2006 & 2010Due surveillance 06/2013   Anxiety    Anxiety and depression    Asthma    Breast mass    right nipple bengn mass per patient   Breast tumor    Chest discomfort    Chronic back pain    Chronic constipation    Collagen vascular disease (HCC)    Complication of anesthesia    Depression    Diverticulosis    Fibromyalgia    Gastroesophageal reflux disease    Hiatal hernia    History of benign esophageal tumor 2010    granular cell esophageal tumor (Dx 06/2008), resected via EMR 2011, due repeat EGD 02/2012   Hyperlipidemia    Hypertension    Insomnia    Migraines    PONV (postoperative nausea and vomiting)    Psychosis (HCC)    Renal insufficiency    Sleep apnea    Stop Bang score of 5. Pt said she was told by Dr. Gerilyn Pilgrim that she had "a little bit" of sleep apena, but not bad enough to treat.   Thyroid nodule    Tumor of esophagus     Past Surgical History:  Procedure Laterality Date   ABDOMINAL HYSTERECTOMY     BALLOON DILATION  08/23/2021   Procedure: BALLOON DILATION;  Surgeon: Lanelle Bal, DO;  [facility];  Service: Endoscopy;;   BIOPSY  10/14/2019   Procedure: BIOPSY;  Surgeon: Corbin Ade, MD;  [facility]: [facility];  Service: Endoscopy;;   BRAVO PH STUDY  12/11/2011   Procedure: BRAVO PH STUDY;  Surgeon: Corbin Ade, MD;  [facility]: [facility];  Service: Endoscopy;  Laterality: N/A;   BREAST EXCISIONAL BIOPSY  1990s, 2012   Left x2-sclerosing ductal papilloma-2012   CHOLECYSTECTOMY N/A 04/09/2013   Procedure: LAPAROSCOPIC CHOLECYSTECTOMY;  Surgeon: Dalia Heading, MD;  Location: [facility];  Service: General;  Laterality: N/A;   COLONOSCOPY  06/2008, 06/2011   sigmoid tics, tubular adenoma; 2013: anal canal hemorrhoids   COLONOSCOPY  07/10/2011   Anal canal hemorrhoids likely the cause of hematochezia in the setting of constipation; otherwise normal rectum ;submucosal  petechiae in left colon of doubtful clinical significance; otherwise, normal colon   COLONOSCOPY WITH PROPOFOL N/A 11/10/2017   cancelled in pre-op   COLONOSCOPY WITH PROPOFOL N/A 07/23/2018   Procedure: COLONOSCOPY WITH PROPOFOL;  Surgeon: Corbin Ade, MD; 1 tubular adenoma, diverticulosis in the sigmoid and descending colon, nonbleeding internal hemorrhoids.  No recommendations to repeat due to age.   ESOPHAGEAL BRUSHING  08/23/2021   Procedure: ESOPHAGEAL BRUSHING;  Surgeon: Lanelle Bal, DO;  [facility];  Service: Endoscopy;;   ESOPHAGEAL DILATION N/A 05/08/2015   Procedure: ESOPHAGEAL DILATION;  Surgeon: Corbin Ade, MD;  Location: [facility];  Service: Endoscopy;  Laterality: N/AElease Houston [phone]  ESOPHAGOGASTRODUODENOSCOPY  12/11/2011   [id number] Schatzki's ring; otherwise normal/Small hiatal hernia. Antral and bulbar erosions   ESOPHAGOGASTRODUODENOSCOPY (EGD) WITH PROPOFOL N/A 05/08/2015   Dr. Jena Gauss: mild erosive reflux esophagitis, non-critical Schatzki's ring s/p dilation. Hiatal hernia.    ESOPHAGOGASTRODUODENOSCOPY (EGD) WITH PROPOFOL N/A 10/14/2019   Procedure: ESOPHAGOGASTRODUODENOSCOPY (EGD) WITH PROPOFOL;  Surgeon: Corbin Ade, MD;  Nonobstructing Schatzki ring, small hiatal hernia, abnormal gastric mucosa s/p biopsied, normal first and second portion of the duodenum.  Pathology with chronic inactive gastritis, no H. pylori.   ESOPHAGOGASTRODUODENOSCOPY (EGD) WITH PROPOFOL N/A 08/23/2021   Surgeon: Lanelle Bal, DO;   white speckled mucosa in the esophagus s/p cells obtained for cytology (KOH prep negative), medium-sized hiatal hernia, grade A reflux esophagitis, mild Schatzki's ring s/p dilation, normal examined stomach and duodenum.   EUS  08/2010   [facility] with EGD. Retained food. No recurrent esophageal lesion, bx negative.   IR KYPHO THORACIC WITH BONE BIOPSY  10/17/2020   IR RADIOLOGIST EVAL & MGMT  09/12/2020   LUNG BIOPSY     negative   PARATHYROIDECTOMY  09/2015   [facility].    PARATHYROIDECTOMY     POLYPECTOMY  07/23/2018   Procedure: POLYPECTOMY;  Surgeon: Corbin Ade, MD;  [facility];  Service: Endoscopy;;  colon   RIGHT OOPHORECTOMY     benign disease   SHOULDER ARTHROSCOPY     Right; bone spurs removed   TONSILLECTOMY     TOTAL KNEE ARTHROPLASTY  2002   Right; previous arthroscopic surgery    Family History  Problem Relation Age of Onset   Stroke Father    Alcohol abuse Father     Heart attack Mother    Depression Mother    Anxiety disorder Mother    Colon cancer Paternal Aunt    Colon cancer Paternal Uncle    Dementia Maternal Uncle    ADD / ADHD Neg Hx    Bipolar disorder Neg Hx    Drug abuse Neg Hx    OCD Neg Hx    Paranoid behavior Neg Hx    Schizophrenia Neg Hx    Seizures Neg Hx    Sexual abuse Neg Hx    Physical abuse Neg Hx     Allergies as of 04/21/2023 - Review Complete 04/21/2023  Allergen Reaction Noted   Elavil [amitriptyline] Other (See Comments) 06/03/2012   Abilify [aripiprazole] Other (See Comments) 11/25/2012   Codeine Hives, Nausea Only, and Other (See Comments)    Latex Hives 11/21/2011   Losartan  09/17/2022   Penicillins Hives, Itching, and Other (See Comments)    Polyethylene glycol Other (See Comments) 12/26/2012   Spironolactone  05/14/2022   Sulfonamide derivatives Nausea And Vomiting 11/25/2022   Cymbalta [duloxetine hcl] Rash 03/10/2013   Remeron [mirtazapine] Other (See Comments) 05/06/2012    Social History   Socioeconomic History   Marital status: Single    Spouse name: Not on file   Number of children: 1   Years of education: Not on file   Highest education level: Not on file  Occupational History   Occupation: disabled    Employer: RETIRED  Tobacco Use   Smoking status: Never   Smokeless tobacco: Never  Vaping Use   Vaping status: Never Used  Substance and Sexual Activity   Alcohol use: No    Alcohol/week: 0.0 standard drinks of alcohol   Drug use: No   Sexual activity: Never  Other Topics Concern   Not on file  Social History Narrative   Not on file   Social Determinants of Health   Financial Resource Strain: Not on file  Food Insecurity: Not on file  Transportation Needs: Not on file  Physical Activity: Not on file  Stress: Not on file  Social Connections: Not on file     Review of Systems   Gen: Denies fever, chills, anorexia. Denies fatigue, weakness, weight loss.  CV: Denies chest  pain, palpitations, syncope, peripheral edema, and claudication. Resp: Denies dyspnea at rest, cough, wheezing, coughing up blood, and pleurisy. GI: See HPI Derm: Denies rash, itching, dry skin Psych: Denies depression, anxiety, memory loss, confusion. No homicidal or suicidal ideation.  Heme: Denies bruising, bleeding, and enlarged lymph nodes.   Physical Exam   BP 135/83   Pulse (!) 59   Temp 98.6 F (37 C)   Ht 5\' 2"  (1.575 m)   Wt 185 lb 12.8 oz (84.3 kg)   BMI 33.98 kg/m   General:   Alert and oriented. No distress noted. Pleasant and cooperative.  Head:  Normocephalic and atraumatic. Eyes:  Conjuctiva clear without scleral icterus. Mouth:  Oral mucosa pink and moist. Good dentition. No lesions. Lungs:  Clear to auscultation bilaterally. No wheezes, rales, or rhonchi. No distress.  Heart:  S1, S2 present without murmurs appreciated.  Abdomen:  +BS, soft, non-distended. Mild ttp to LUQ. No rebound or guarding. No HSM or masses noted. Rectal: deferred Msk:  Symmetrical without gross deformities. Normal posture. Extremities:  Without edema. Neurologic:  Alert and  oriented x4 Psych:  Alert and cooperative. Normal mood and affect.  Assessment  CRESCENTIA KNIESS is a 77 y.o. female with a history of opioid-induced constipation, adenomatous colon polyps, GERD, esophageal and oropharyngeal dysphagia, esophageal variceal tumor s/p EMR in 2011 at [facility] presenting today for follow-up of dysphagia, reflux, and constipation.  Dysphagia: Stable. Avoids eggs, potatoes, rice, etc due to difficulties. Occasional pill dysphagia.  Discussed taking medications whole with pudding if having difficulty.  GERD, LUQ pain: GERD well-controlled with pantoprazole 40 mg twice daily and famotidine nightly.  Denies breakthrough symptoms.  Concerned regarding long-term timeframe of being on PPI therapy.  We discussed the potential long-term side effects of PPI today and discussed weaning if she would like  although I advised that if we did this her swallowing could potentially worsen but that we could do this and increase her famotidine.  She elected to continue PPI therapy for now.  Offered additional speech therapy and she declined.  Reinforced previous recommendations.  Constipation: Well-controlled currently without medication.  Bowels continuing to move daily with protein shakes.  No alarm symptoms.  PLAN   Continue pantoprazole 40 mg twice daily Continue famotidine 20 mg nightly GERD diet Previous swallow recommendations we discussed Continue to follow with ENT/allergist. Continue protein shakes daily Use pudding to take medications.  Follow up in 6 months.     Brooke Bonito, MSN, FNP-BC, AGACNP-BC [facility]

## 2023-04-21 NOTE — Patient Instructions (Addendum)
Continue pantoprazole 40 mg twice daily.  If you decide that you would like to decrease your pantoprazole or try to wean off please let me know and we could consider increasing your famotidine.  You can continue famotidine 20 mg nightly.  Follow a GERD diet:  Avoid fried, fatty, greasy, spicy, citrus foods. Avoid caffeine and carbonated beverages. Avoid chocolate. Try eating 4-6 small meals a day rather than 3 large meals. Do not eat within 3 hours of laying down. Prop head of bed up on wood or bricks to create a 6 inch incline.  Continue your protein shakes 2/day.  I would recommend that you start taking your medications with pudding especially if you are having issues with them feeling like they are stuck.  Please continue to follow speech therapy's recommendations of a forceful swallow when you are eating and continuing to take small bites and alternating with sips of liquids.  Please let me know if you begin to have any worsening swallowing issues, vomiting, or difficulty and maintain your weight or experience a greater than 8 pound weight loss in 1-2 months.  Follow up in 6 months.   I hope you have a wonderful Thanksgiving and Christmas!  It was a pleasure to see you today. I want to create trusting relationships with patients. If you receive a survey regarding your visit,  I greatly appreciate you taking time to fill this out on paper or through your MyChart. I value your feedback.  Brooke Bonito, MSN, FNP-BC, AGACNP-BC [facility]

## 2023-05-08 ENCOUNTER — Ambulatory Visit: Payer: 59 | Admitting: Orthopedic Surgery

## 2023-05-08 ENCOUNTER — Encounter: Payer: Self-pay | Admitting: Orthopedic Surgery

## 2023-05-08 DIAGNOSIS — M722 Plantar fascial fibromatosis: Secondary | ICD-10-CM

## 2023-05-08 MED ORDER — METHYLPREDNISOLONE ACETATE 40 MG/ML IJ SUSP
40.0000 mg | Freq: Once | INTRAMUSCULAR | Status: AC
Start: 2023-05-08 — End: 2023-05-08
  Administered 2023-05-08: 40 mg via INTRA_ARTICULAR

## 2023-05-08 NOTE — Patient Instructions (Signed)
Continue ice and exercises   Joint Steroid Injection A joint steroid injection is a procedure to relieve swelling and pain in a joint. Steroids are medicines that reduce inflammation. In this procedure, your health care provider uses a syringe and a needle to inject a steroid medicine into a painful and inflamed joint. A pain-relieving medicine (anesthetic) may be injected along with the steroid. In some cases, your health care provider may use an imaging technique such as ultrasound or fluoroscopy to guide the injection. Joints that are often treated with steroid injections include the knee, shoulder, hip, and spine. These injections may also be used in the elbow, ankle, and joints of the hands or feet. You may have joint steroid injections as part of your treatment for inflammation caused by: Gout. Rheumatoid arthritis. Advanced wear-and-tear arthritis (osteoarthritis). Tendinitis. Bursitis. Joint steroid injections may be repeated, but having them too often can damage a joint or the skin over the joint. You should not have joint steroid injections less than 6 weeks apart or more than four times a year. Tell a health care provider about: Any allergies you have. All medicines you are taking, including vitamins, herbs, eye drops, creams, and over-the-counter medicines. Any problems you or family members have had with anesthetic medicines. Any blood disorders you have. Any surgeries you have had. Any medical conditions you have. Whether you are pregnant or may be pregnant. What are the risks? Generally, this is a safe treatment. However, problems may occur, including: Infection. Bleeding. Allergic reactions to medicines. Damage to the joint or tissues around the joint. Thinning of skin or loss of skin color over the joint. Temporary flushing of the face or chest. Temporary increase in pain. Temporary increase in blood sugar. Failure to relieve inflammation or pain. What happens before  the treatment? Medicines Ask your health care provider about: Changing or stopping your regular medicines. This is especially important if you are taking diabetes medicines or blood thinners. Taking medicines such as aspirin and ibuprofen. These medicines can thin your blood. Do not take these medicines unless your health care provider tells you to take them. Taking over-the-counter medicines, vitamins, herbs, and supplements. General instructions You may have imaging tests of your joint. Ask your health care provider if you can drive yourself home after the procedure. What happens during the treatment?  Your health care provider will position you for the injection and locate the injection site over your joint. The skin over the joint will be cleaned with a germ-killing soap. Your health care provider may: Spray a numbing solution (topical anesthetic) over the injection site. Inject a local anesthetic under the skin above your joint. The needle will be placed through your skin into your joint. Your health care provider may use imaging to guide the needle to the right spot for the injection. If imaging is used, a special contrast dye may be injected to confirm that the needle is in the correct location. The steroid medicine will be injected into your joint. Anesthetic may be injected along with the steroid. This may be a medicine that relieves pain for a short time (short-acting anesthetic) or for a longer time (long-acting anesthetic). The needle will be removed, and an adhesive bandage (dressing) will be placed over the injection site. The procedure may vary among health care providers and hospitals. What can I expect after the treatment? You will be able to go home after the treatment. It is normal to feel slight flushing for a few days after the  injection. After the treatment, it is common to have an increase in joint pain after the anesthetic has worn off. This may happen about an hour  after a short-acting anesthetic or about 8 hours after a longer-acting anesthetic. You should begin to feel relief from joint pain and swelling after 24 to 48 hours. Contact your health care provider if you do not begin to feel relief after 2 days. Follow these instructions at home: Injection site care Leave the adhesive dressing over your injection site in place until your health care provider says you can remove it. Check your injection site every day for signs of infection. Check for: More redness, swelling, or pain. Fluid or blood. Warmth. Pus or a bad smell. Activity Return to your normal activities as told by your health care provider. Ask your health care provider what activities are safe for you. You may be asked to limit activities that put stress on the joint for a few days. Do joint exercises as told by your health care provider. Do not take baths, swim, or use a hot tub until your health care provider approves. Ask your health care provider if you may take showers. You may only be allowed to take sponge baths. Managing pain, stiffness, and swelling  If directed, put ice on the joint. To do this: Put ice in a plastic bag. Place a towel between your skin and the bag. Leave the ice on for 20 minutes, 2-3 times a day. Remove the ice if your skin turns bright red. This is very important. If you cannot feel pain, heat, or cold, you have a greater risk of damage to the area. Raise (elevate) your joint above the level of your heart when you are sitting or lying down. General instructions Take over-the-counter and prescription medicines only as told by your health care provider. Do not use any products that contain nicotine or tobacco, such as cigarettes, e-cigarettes, and chewing tobacco. These can delay joint healing. If you need help quitting, ask your health care provider. If you have diabetes, be aware that your blood sugar may be slightly elevated for several days after the  injection. Keep all follow-up visits. This is important. Contact a health care provider if you have: Chills or a fever. Any signs of infection at your injection site. Increased pain or swelling or no relief after 2 days. Summary A joint steroid injection is a treatment to relieve pain and swelling in a joint. Steroids are medicines that reduce inflammation. Your health care provider may add an anesthetic along with the steroid. You may have joint steroid injections as part of your arthritis treatment. Joint steroid injections may be repeated, but having them too often can damage a joint or the skin over the joint. Contact your health care provider if you have a fever, chills, or signs of infection, or if you get no relief from joint pain or swelling. This information is not intended to replace advice given to you by your health care provider. Make sure you discuss any questions you have with your health care provider. Document Revised: 11/19/2019 Document Reviewed: 11/19/2019 Elsevier Patient Education  2024 [employer].

## 2023-05-08 NOTE — Progress Notes (Signed)
   VISIT TYPE: FOLLOW UP   Chief Complaint  Patient presents with   Foot Pain    Left / injection helped for about a month but now pain is back just as bad     Encounter Diagnosis  Name Primary?   Plantar fasciitis Yes    Assessment and Plan: Kennon Rounds is doing well recommend stretch icing and another injection follow-up in a month  Procedure note injection plantar fascia left heel  Patient requested injection gave consent for injection and injection site was confirmed by timeout  Medication used  Depo-Medrol 40 mg 1 cc Lidocaine 1% 2 cc  Technique Point of maximal tenderness palpated skin cleaned with ethyl chloride and alcohol injection with 25-gauge needle tolerated well no complications were noted   A/P Encounter Diagnosis  Name Primary?   Plantar fasciitis Yes    Meds ordered this encounter  Medications   methylPREDNISolone acetate (DEPO-MEDROL) injection 40 mg

## 2023-05-19 ENCOUNTER — Ambulatory Visit: Payer: 59 | Admitting: Family Medicine

## 2023-05-20 ENCOUNTER — Ambulatory Visit: Payer: 59 | Admitting: Family Medicine

## 2023-06-06 ENCOUNTER — Ambulatory Visit (INDEPENDENT_AMBULATORY_CARE_PROVIDER_SITE_OTHER): Payer: 59 | Admitting: Orthopedic Surgery

## 2023-06-06 VITALS — BP 134/80 | HR 71 | Ht 62.0 in | Wt 185.0 lb

## 2023-06-06 DIAGNOSIS — M79672 Pain in left foot: Secondary | ICD-10-CM

## 2023-06-06 DIAGNOSIS — M7662 Achilles tendinitis, left leg: Secondary | ICD-10-CM | POA: Diagnosis not present

## 2023-06-06 DIAGNOSIS — M722 Plantar fascial fibromatosis: Secondary | ICD-10-CM | POA: Diagnosis not present

## 2023-06-06 NOTE — Progress Notes (Signed)
Follow-up appointment  Encounter Diagnoses  Name Primary?   Plantar fasciitis Yes   Pain of left heel    Achilles tendinitis, left leg     Chief Complaint  Patient presents with   Foot Pain    FU right foot pain patient says she is having pain in the rt heel and under the right heel  some days are better than others, sensitive to touch on some days the sheets hurt to touch     77 year old female that we have been treating for plantar fasciitis/Achilles tendinitis since June.  She has had 2 injections she has been on anti-inflammatories and she has had 2 rounds of home therapy and she wore a cam walker  She continues to complain of plantar foot pain  Today she still has pain on the plantar aspect of the foot with occasional pain into the Achilles and into the calf muscle  She is tender to touch in this region  Ankle alignment looks normal  Ankle joint is stable  Dorsiflexion and plantarflexion strength are normal  Altered gait  Encounter Diagnoses  Name Primary?   Plantar fasciitis Yes   Pain of left heel    Achilles tendinitis, left leg     MRI of the ankle evaluate Achilles tendon and plantar fascia  Patient may need surgical intervention with plantar fascia release plus or minus Achilles tendon lengthening

## 2023-06-06 NOTE — Patient Instructions (Signed)
While we are working on your approval for MRI please go ahead and call to schedule your appointment with [facility] within at least one (1) week.   Central Scheduling [phone]  

## 2023-06-11 ENCOUNTER — Ambulatory Visit (HOSPITAL_COMMUNITY)
Admission: RE | Admit: 2023-06-11 | Discharge: 2023-06-11 | Disposition: A | Discharge status: Home / Self Care | Payer: 59 | Source: Ambulatory Visit | Attending: Orthopedic Surgery | Admitting: Orthopedic Surgery

## 2023-06-11 DIAGNOSIS — M19072 Primary osteoarthritis, left ankle and foot: Secondary | ICD-10-CM | POA: Diagnosis not present

## 2023-06-11 DIAGNOSIS — M722 Plantar fascial fibromatosis: Secondary | ICD-10-CM | POA: Insufficient documentation

## 2023-06-11 DIAGNOSIS — M7662 Achilles tendinitis, left leg: Secondary | ICD-10-CM | POA: Insufficient documentation

## 2023-06-11 DIAGNOSIS — M25572 Pain in left ankle and joints of left foot: Secondary | ICD-10-CM | POA: Diagnosis not present

## 2023-06-22 NOTE — Progress Notes (Deleted)
No chief complaint on file.   HISTORY OF PRESENT ILLNESS:  06/22/23 ALL:  [name] is a 77 y.o. female here today for follow up for migraines. She was last seen by Dr Delena Bali 04/2022 and started on [place]. She called 06/2022 reporting swollen tongue and difficulty swallowing after starting Qulipta and was switched to [employer]   Since,    HISTORY (copied from Dr Quentin Mulling previous note)  Medical co-morbidities: HTN, CHF, OSA, hyperparathyroidism, prediabetes, asthma, CKD 3, HLD, depression, anxiety, dementia   The patient presents for evaluation of migraines which have been present for several years. They have been worsening in frequency since March. Migraines are described as frontal or left retro-orbital pain with associated photophobia, phonophobia, and nausea. She does occasionally have an aura of flashing lights in her vision. Headaches can last for several hours at a time.   She follows with Neurology for dementia and migraines (last seen 03/05/22) and was has tried multiple preventive medications including Botox. She was most recently on Aimovig for 2 years, but stopped it as she did not think it was effective. She is currently taking Marlin Canary Powder nearly every day for her headaches.   Headache History: Aura: lights in vision Location: front, left retro-orbital, neck Associated Symptoms:             Photophobia: yes             Phonophobia: yes             Nausea: yes Worse with activity?: yes Duration of headaches: several hours   Headache days per month: 14 Headache free days per month: 16   Current Treatment: Abortive Goody Powder   Preventative none   Prior Therapies                                 Prevention Aimovig 70 mg monthly Lisinopril - cough Losartan 100 mg daily Atenolol - fatigue Topamax 100 mg daily Gabapentin 300 mg QHS Effexor 225 mg daily Cymbalta - rash Botox   Rescue Goody Powder Seroquel 25 mg PRN   REVIEW OF  SYSTEMS: Out of a complete 14 system review of symptoms, the patient complains only of the following symptoms, and all other reviewed systems are negative.   ALLERGIES: Allergies  Allergen Reactions   Elavil [Amitriptyline] Other (See Comments)    Felt really nervous and felt like something was hold her feet or arm and/ or AM headache on it and back on it and went away off it.    Abilify [Aripiprazole] Other (See Comments)    Dystonic reaction   Codeine Hives, Nausea Only and Other (See Comments)    Feels funny   Latex Hives   Losartan     Tongue Swelling   Penicillins Hives, Itching and Other (See Comments)    Has patient had a PCN reaction causing immediate rash, facial/tongue/throat swelling, SOB or lightheadedness with hypotension: Yes Has patient had a PCN reaction causing severe rash involving mucus membranes or skin necrosis: No Has patient had a PCN reaction that required hospitalization No Has patient had a PCN reaction occurring within the last 10 years: No If all of the above answers are "NO", then may proceed with Cephalosporin use.    Polyethylene Glycol Other (See Comments)    Per allergy test   Spironolactone     Hyperkalemia   Sulfonamide Derivatives Nausea And Vomiting  Cymbalta [Duloxetine Hcl] Rash   Remeron [Mirtazapine] Other (See Comments)    Caused lots of strange, weird, crazy dreams.     HOME MEDICATIONS: Outpatient Medications Prior to Visit  Medication Sig Dispense Refill   albuterol (PROVENTIL HFA;VENTOLIN HFA) 108 (90 BASE) MCG/ACT inhaler Inhale 2 puffs into the lungs every 6 (six) hours as needed for wheezing or shortness of breath.      amLODipine (NORVASC) 10 MG tablet Take 10 mg by mouth daily.     AUVELITY 45-105 MG TBCR Take 1 tablet twice a day by oral route.     azelastine (ASTELIN) 0.1 % nasal spray 1-2 sprays each nostril twice daily as needed 30 mL 5   Azelastine-Fluticasone 137-50 MCG/ACT SUSP      BREZTRI AEROSPHERE 160-9-4.8  MCG/ACT AERO Inhale into the lungs.     Calcium Carbonate-Vit D-Min (CALCIUM 1200 PO) Take by mouth daily.     carvedilol (COREG) 3.125 MG tablet Take 1 tablet (3.125 mg total) by mouth 2 (two) times daily with a meal. 60 tablet 6   cetirizine (ZYRTEC) 10 MG tablet TAKE ONE TABLET (10MG  TOTAL0 BY MOUTH DAILY AS NEEDED FOR ALLERGIES (ITCHING) 30 tablet 5   Cyanocobalamin (VITAMIN B12 PO) Take by mouth.     famotidine (PEPCID) 20 MG tablet TAKE TWO TABLETS (40MG  TOTAL) BY MOUTH AT BEDTIME     Ferrous Sulfate (IRON PO) Take by mouth daily.     fluorometholone (FML) 0.1 % ophthalmic suspension SMARTSIG:In Eye(s)     fluticasone (FLONASE) 50 MCG/ACT nasal spray Place 2 sprays into both nostrils daily. 16 g 5   fluticasone 0.05%-ketoconazole 2% 1:1 cream mixture Apply topically.     furosemide (LASIX) 40 MG tablet Take 1 tablet (40 mg total) by mouth daily. 90 tablet 3   hydrALAZINE (APRESOLINE) 100 MG tablet Take 1 tablet (100 mg total) by mouth 3 (three) times daily. 270 tablet 1   hydrOXYzine (ATARAX) 25 MG tablet      ketoconazole (NIZORAL) 2 % cream Apply topically 2 (two) times daily as needed.     levothyroxine (SYNTHROID) 25 MCG tablet Take 25 mcg by mouth daily.     lovastatin (MEVACOR) 40 MG tablet Take 40 mg by mouth at bedtime.     Melatonin 3 MG CAPS Take by mouth.     meloxicam (MOBIC) 7.5 MG tablet Take 1 tablet (7.5 mg total) by mouth daily. 30 tablet 5   methocarbamol (ROBAXIN) 500 MG tablet TAKE ONE (1) TABLET BY MOUTH 3 TIMES DAILY 60 tablet 1   Multiple Vitamins-Minerals (CENTRUM SILVER 50+WOMEN PO) Take by mouth daily.     pantoprazole (PROTONIX) 40 MG tablet TAKE ONE TABLET (40MG  TOTAL) BY MOUTH TWO TIMES DAILY 60 tablet 5   potassium chloride SA (KLOR-CON M) 20 MEQ tablet Take 1 tablet (20 mEq total) by mouth daily. 30 tablet 6   RESTASIS 0.05 % ophthalmic emulsion 1 drop 2 (two) times daily.     triamcinolone cream (KENALOG) 0.1 % Apply topically 2 (two) times daily.      Ubrogepant (UBRELVY) 100 MG TABS TAKE ONE TABLET (100MG ) BY MOUTH AS NEEDED FOR MIGRAINES. MAY REPEAT A DOSE IN 2 HOURS IF HEADACHE PERSISTS. MAX DOSE 2 TABLETS IN 24 HOURS. 16 tablet 3   No facility-administered medications prior to visit.     PAST MEDICAL HISTORY: Past Medical History:  Diagnosis Date   Adenomatous colon polyp 2006   excised in 2006 & 2010Due surveillance 06/2013   Anxiety  Anxiety and depression    Asthma    Breast mass    right nipple bengn mass per patient   Breast tumor    Chest discomfort    Chronic back pain    Chronic constipation    Collagen vascular disease (HCC)    Complication of anesthesia    Depression    Diverticulosis    Fibromyalgia    Gastroesophageal reflux disease    Hiatal hernia    History of benign esophageal tumor 2010   granular cell esophageal tumor (Dx 06/2008), resected via EMR 2011, due repeat EGD 02/2012   Hyperlipidemia    Hypertension    Insomnia    Migraines    PONV (postoperative nausea and vomiting)    Psychosis (HCC)    Renal insufficiency    Sleep apnea    Stop Bang score of 5. Pt said she was told by Dr. Gerilyn Pilgrim that she had "a little bit" of sleep apena, but not bad enough to treat.   Thyroid nodule    Tumor of esophagus      PAST SURGICAL HISTORY: Past Surgical History:  Procedure Laterality Date   ABDOMINAL HYSTERECTOMY     BALLOON DILATION  08/23/2021   Procedure: BALLOON DILATION;  Surgeon: Lanelle Bal, DO;  [facility];  Service: Endoscopy;;   BIOPSY  10/14/2019   Procedure: BIOPSY;  Surgeon: Corbin Ade, MD;  [facility];  Service: Endoscopy;;   BRAVO PH STUDY  12/11/2011   Procedure: BRAVO PH STUDY;  Surgeon: Corbin Ade, MD;  [facility];  Service: Endoscopy;  Laterality: N/A;   BREAST EXCISIONAL BIOPSY  1990s, 2012   Left x2-sclerosing ductal papilloma-2012   CHOLECYSTECTOMY N/A 04/09/2013   Procedure: LAPAROSCOPIC CHOLECYSTECTOMY;  Surgeon: Dalia Heading, MD;  Location: [facility];  Service: General;  Laterality: N/A;   COLONOSCOPY  06/2008, 06/2011   sigmoid tics, tubular adenoma; 2013: anal canal hemorrhoids   COLONOSCOPY  07/10/2011   Anal canal hemorrhoids likely the cause of hematochezia in the setting of constipation; otherwise normal rectum ;submucosal  petechiae in left colon of doubtful clinical significance; otherwise, normal colon   COLONOSCOPY WITH PROPOFOL N/A 11/10/2017   cancelled in pre-op   COLONOSCOPY WITH PROPOFOL N/A 07/23/2018   Procedure: COLONOSCOPY WITH PROPOFOL;  Surgeon: Corbin Ade, MD; 1 tubular adenoma, diverticulosis in the sigmoid and descending colon, nonbleeding internal hemorrhoids.  No recommendations to repeat due to age.   ESOPHAGEAL BRUSHING  08/23/2021   Procedure: ESOPHAGEAL BRUSHING;  Surgeon: Lanelle Bal, DO;  [facility];  Service: Endoscopy;;   ESOPHAGEAL DILATION N/A 05/08/2015   Procedure: ESOPHAGEAL DILATION;  Surgeon: Corbin Ade, MD;  Location: [facility];  Service: Endoscopy;  Laterality: N/AElease Hashimoto 54/56   ESOPHAGOGASTRODUODENOSCOPY  12/11/2011   [id number] Schatzki's ring; otherwise normal/Small hiatal hernia. Antral and bulbar erosions   ESOPHAGOGASTRODUODENOSCOPY (EGD) WITH PROPOFOL N/A 05/08/2015   Dr. Jena Gauss: mild erosive reflux esophagitis, non-critical Schatzki's ring s/p dilation. Hiatal hernia.    ESOPHAGOGASTRODUODENOSCOPY (EGD) WITH PROPOFOL N/A 10/14/2019   Procedure: ESOPHAGOGASTRODUODENOSCOPY (EGD) WITH PROPOFOL;  Surgeon: Corbin Ade, MD;  Nonobstructing Schatzki ring, small hiatal hernia, abnormal gastric mucosa s/p biopsied, normal first and second portion of the duodenum.  Pathology with chronic inactive gastritis, no H. pylori.   ESOPHAGOGASTRODUODENOSCOPY (EGD) WITH PROPOFOL N/A 08/23/2021   Surgeon: Lanelle Bal, DO;   white speckled mucosa in the esophagus s/p cells obtained for cytology (  KOH prep negative), medium-sized hiatal hernia,  grade A reflux esophagitis, mild Schatzki's ring s/p dilation, normal examined stomach and duodenum.   EUS  08/2010   Dr [facility] with EGD. Retained food. No recurrent esophageal lesion, bx negative.   IR KYPHO THORACIC WITH BONE BIOPSY  10/17/2020   IR RADIOLOGIST EVAL & MGMT  09/12/2020   LUNG BIOPSY     negative   PARATHYROIDECTOMY  09/2015   [facility].    PARATHYROIDECTOMY     POLYPECTOMY  07/23/2018   Procedure: POLYPECTOMY;  Surgeon: Corbin Ade, MD;  [facility];  Service: Endoscopy;;  colon   RIGHT OOPHORECTOMY     benign disease   SHOULDER ARTHROSCOPY     Right; bone spurs removed   TONSILLECTOMY     TOTAL KNEE ARTHROPLASTY  2002   Right; previous arthroscopic surgery     FAMILY HISTORY: Family History  Problem Relation Age of Onset   Stroke Father    Alcohol abuse Father    Heart attack Mother    Depression Mother    Anxiety disorder Mother    Colon cancer Paternal Aunt    Colon cancer Paternal Uncle    Dementia Maternal Uncle    ADD / ADHD Neg Hx    Bipolar disorder Neg Hx    Drug abuse Neg Hx    OCD Neg Hx    Paranoid behavior Neg Hx    Schizophrenia Neg Hx    Seizures Neg Hx    Sexual abuse Neg Hx    Physical abuse Neg Hx      SOCIAL HISTORY: Social History   Socioeconomic History   Marital status: Single    Spouse name: Not on file   Number of children: 1   Years of education: Not on file   Highest education level: Not on file  Occupational History   Occupation: disabled    Employer: RETIRED  Tobacco Use   Smoking status: Never   Smokeless tobacco: Never  Vaping Use   Vaping status: Never Used  Substance and Sexual Activity   Alcohol use: No    Alcohol/week: 0.0 standard drinks of alcohol   Drug use: No   Sexual activity: Never  Other Topics Concern   Not on file  Social History Narrative   Not on file   Social Drivers of Health   Financial Resource Strain: Not on file  Food Insecurity: Not on file  Transportation  Needs: Not on file  Physical Activity: Not on file  Stress: Not on file  Social Connections: Not on file  Intimate Partner Violence: Not on file     PHYSICAL EXAM  There were no vitals filed for this visit. There is no height or weight on file to calculate BMI.  Generalized: Well developed, in no acute distress  Cardiology: normal rate and rhythm, no murmur auscultated  Respiratory: clear to auscultation bilaterally    Neurological examination  Mentation: Alert oriented to time, place, history taking. Follows all commands speech and language fluent Cranial nerve II-XII: Pupils were equal round reactive to light. Extraocular movements were full, visual field were full on confrontational test. Facial sensation and strength were normal. Uvula tongue midline. Head turning and shoulder shrug  were normal and symmetric. Motor: The motor testing reveals 5 over 5 strength of all 4 extremities. Good symmetric motor tone is noted throughout.  Sensory: Sensory testing is intact to soft touch on all 4 extremities. No evidence of extinction is noted.  Coordination:  Cerebellar testing reveals good finger-nose-finger and heel-to-shin bilaterally.  Gait and station: Gait is normal. Tandem gait is normal. Romberg is negative. No drift is seen.  Reflexes: Deep tendon reflexes are symmetric and normal bilaterally.    DIAGNOSTIC DATA (LABS, IMAGING, TESTING) - I reviewed patient records, labs, notes, testing and imaging myself where available.  Lab Results  Component Value Date   WBC 5.7 09/24/2022   HGB 13.2 09/24/2022   HCT 41.4 09/24/2022   MCV 78 (L) 09/24/2022   PLT 252 09/24/2022      Component Value Date/Time   NA 138 03/18/2022 1017   K 4.6 03/18/2022 1017   CL 101 03/18/2022 1017   CO2 18 (L) 03/18/2022 1017   GLUCOSE 102 (H) 03/18/2022 1017   GLUCOSE 104 (H) 02/12/2022 1216   BUN 18 03/18/2022 1017   CREATININE 1.10 (H) 03/18/2022 1017   CREATININE 0.84 09/26/2021 1524    CALCIUM 10.3 03/18/2022 1017   PROT 6.1 09/10/2021 1425   ALBUMIN 4.2 08/21/2021 0213   AST 17 09/10/2021 1425   ALT 19 09/10/2021 1425   ALKPHOS 77 08/21/2021 0213   BILITOT 0.6 09/10/2021 1425   GFRNONAA >60 02/12/2022 1216   GFRNONAA 56 (L) 07/26/2015 0934   GFRAA >60 10/11/2019 1440   GFRAA 64 07/26/2015 0934   Lab Results  Component Value Date   CHOL 147 06/01/2019   HDL 65 06/01/2019   LDLCALC 66 06/01/2019   TRIG 79 06/01/2019   CHOLHDL 2.3 06/01/2019   Lab Results  Component Value Date   HGBA1C 5.6 08/21/2021   No results found for: "VITAMINB12" Lab Results  Component Value Date   TSH 0.454 09/24/2022        No data to display               No data to display           ASSESSMENT AND PLAN  77 y.o. year old female  has a past medical history of Adenomatous colon polyp (2006), Anxiety, Anxiety and depression, Asthma, Breast mass, Breast tumor, Chest discomfort, Chronic back pain, Chronic constipation, Collagen vascular disease (HCC), Complication of anesthesia, Depression, Diverticulosis, Fibromyalgia, Gastroesophageal reflux disease, Hiatal hernia, History of benign esophageal tumor (2010), Hyperlipidemia, Hypertension, Insomnia, Migraines, PONV (postoperative nausea and vomiting), Psychosis (HCC), Renal insufficiency, Sleep apnea, Thyroid nodule, and Tumor of esophagus. here with    No diagnosis found.  [place]el ***.  Healthy lifestyle habits encouraged. *** will follow up with PCP as directed. *** will return to see me in ***, sooner if needed. *** verbalizes understanding and agreement with this plan.   No orders of the defined types were placed in this encounter.    No orders of the defined types were placed in this encounter.    Shawnie Dapper, MSN, FNP-C 06/22/2023, 3:04 PM  [facility] [address], [address] [place], [place] [address] [phone]

## 2023-06-23 ENCOUNTER — Encounter: Payer: Self-pay | Admitting: Family Medicine

## 2023-06-23 ENCOUNTER — Ambulatory Visit: Payer: 59 | Admitting: Family Medicine

## 2023-06-23 DIAGNOSIS — G43109 Migraine with aura, not intractable, without status migrainosus: Secondary | ICD-10-CM

## 2023-06-26 DIAGNOSIS — I10 Essential (primary) hypertension: Secondary | ICD-10-CM | POA: Diagnosis not present

## 2023-06-26 DIAGNOSIS — R7303 Prediabetes: Secondary | ICD-10-CM | POA: Diagnosis not present

## 2023-06-26 DIAGNOSIS — E039 Hypothyroidism, unspecified: Secondary | ICD-10-CM | POA: Diagnosis not present

## 2023-06-27 ENCOUNTER — Ambulatory Visit (INDEPENDENT_AMBULATORY_CARE_PROVIDER_SITE_OTHER): Payer: 59 | Admitting: Orthopedic Surgery

## 2023-06-27 ENCOUNTER — Encounter: Payer: Self-pay | Admitting: Orthopedic Surgery

## 2023-06-27 DIAGNOSIS — R7303 Prediabetes: Secondary | ICD-10-CM | POA: Diagnosis not present

## 2023-06-27 DIAGNOSIS — M722 Plantar fascial fibromatosis: Secondary | ICD-10-CM | POA: Diagnosis not present

## 2023-06-27 NOTE — Progress Notes (Signed)
 FOLLOW-UP OFFICE VISIT   Patient: [name]           Date of Birth: 10-15-1945           MRN: [id number] Visit Date: 06/27/2023 Requested by: Shona Norleen PEDLAR, MD [address] [place],  [place] [address] PCP: Shona Norleen PEDLAR, MD    Encounter Diagnosis  Name Primary?   Plantar fasciitis Yes    Chief Complaint  Patient presents with   Foot Pain    Left/ review MRI    78 year old female status post injection of the heel for plantar fascia also was treated with stretching exercises, cryotherapy and CAM Walker  She did not improve and was sent for MRI  Her symptoms have improved significantly  She is just having some sensitivity to the heel when it lying on the bed  The MRI I reviewed  My reading is mild calcification Achilles tendon no evidence of tendinosis of any significant severity and no tearing of the plantar fascia  ASSESSMENT AND PLAN Resolved plantar fasciitis asymptomatic Achilles tendinitis tendinosis  Padding should be applied to the heel to prevent sensitive area from touching the bed

## 2023-06-27 NOTE — Patient Instructions (Signed)
 [facility] AT [facility]

## 2023-07-01 ENCOUNTER — Other Ambulatory Visit: Payer: Self-pay | Admitting: Internal Medicine

## 2023-07-01 NOTE — Telephone Encounter (Signed)
 Patient called stating she needs a refill on Fluticasone. Patient requests the medication to be sent to [facility] on [address]

## 2023-07-03 ENCOUNTER — Encounter: Payer: Self-pay | Admitting: Cardiology

## 2023-07-03 DIAGNOSIS — L299 Pruritus, unspecified: Secondary | ICD-10-CM | POA: Diagnosis not present

## 2023-07-03 DIAGNOSIS — I130 Hypertensive heart and chronic kidney disease with heart failure and stage 1 through stage 4 chronic kidney disease, or unspecified chronic kidney disease: Secondary | ICD-10-CM | POA: Diagnosis not present

## 2023-07-03 DIAGNOSIS — T783XXA Angioneurotic edema, initial encounter: Secondary | ICD-10-CM | POA: Insufficient documentation

## 2023-07-03 DIAGNOSIS — M722 Plantar fascial fibromatosis: Secondary | ICD-10-CM | POA: Insufficient documentation

## 2023-07-03 DIAGNOSIS — M2550 Pain in unspecified joint: Secondary | ICD-10-CM | POA: Insufficient documentation

## 2023-07-03 DIAGNOSIS — G4700 Insomnia, unspecified: Secondary | ICD-10-CM | POA: Diagnosis not present

## 2023-07-03 DIAGNOSIS — M7662 Achilles tendinitis, left leg: Secondary | ICD-10-CM | POA: Diagnosis not present

## 2023-07-03 DIAGNOSIS — I5032 Chronic diastolic (congestive) heart failure: Secondary | ICD-10-CM | POA: Diagnosis not present

## 2023-07-03 DIAGNOSIS — R0609 Other forms of dyspnea: Secondary | ICD-10-CM | POA: Diagnosis not present

## 2023-07-03 DIAGNOSIS — R609 Edema, unspecified: Secondary | ICD-10-CM | POA: Diagnosis not present

## 2023-07-03 DIAGNOSIS — Z8679 Personal history of other diseases of the circulatory system: Secondary | ICD-10-CM | POA: Diagnosis not present

## 2023-07-03 DIAGNOSIS — J310 Chronic rhinitis: Secondary | ICD-10-CM | POA: Diagnosis not present

## 2023-07-15 ENCOUNTER — Other Ambulatory Visit: Payer: Self-pay | Admitting: Cardiology

## 2023-07-16 ENCOUNTER — Other Ambulatory Visit: Payer: Self-pay | Admitting: Student

## 2023-07-17 ENCOUNTER — Encounter (HOSPITAL_COMMUNITY): Payer: Self-pay | Admitting: Internal Medicine

## 2023-07-17 ENCOUNTER — Other Ambulatory Visit (HOSPITAL_COMMUNITY): Payer: Self-pay | Admitting: Internal Medicine

## 2023-07-17 DIAGNOSIS — Z13820 Encounter for screening for osteoporosis: Secondary | ICD-10-CM

## 2023-07-21 ENCOUNTER — Ambulatory Visit (HOSPITAL_COMMUNITY)
Admission: RE | Admit: 2023-07-21 | Discharge: 2023-07-21 | Disposition: A | Discharge status: Home / Self Care | Payer: 59 | Source: Ambulatory Visit | Attending: Internal Medicine | Admitting: Internal Medicine

## 2023-07-21 DIAGNOSIS — M810 Age-related osteoporosis without current pathological fracture: Secondary | ICD-10-CM | POA: Diagnosis not present

## 2023-07-21 DIAGNOSIS — E213 Hyperparathyroidism, unspecified: Secondary | ICD-10-CM | POA: Insufficient documentation

## 2023-07-21 DIAGNOSIS — Z13820 Encounter for screening for osteoporosis: Secondary | ICD-10-CM | POA: Diagnosis not present

## 2023-07-21 DIAGNOSIS — Z780 Asymptomatic menopausal state: Secondary | ICD-10-CM | POA: Diagnosis not present

## 2023-08-21 IMAGING — RF DG SWALLOWING FUNCTION
11 series · 13 of 24 positions shown · non-contrast
Comparison: 07/03/2020

CLINICAL DATA: Oropharyngeal dysphagia

EXAM:
MODIFIED BARIUM SWALLOW
TECHNIQUE: Different consistencies of barium were administered orally to the
patient by the Speech Pathologist. Imaging of the pharynx was
performed in the lateral projection. The radiologist was present in
the fluoroscopy room for this study, providing personal supervision.
FLUOROSCOPY TIME:  Fluoroscopy Time:  3 minutes 24 seconds
Radiation Exposure Index (if provided by the fluoroscopic device):
10.9 mGy
Number of Acquired Spot Images: multiple fluoroscopic screen
captures

[Series 1: before po · 1 of 199 frames shown]
[frame 30/199]
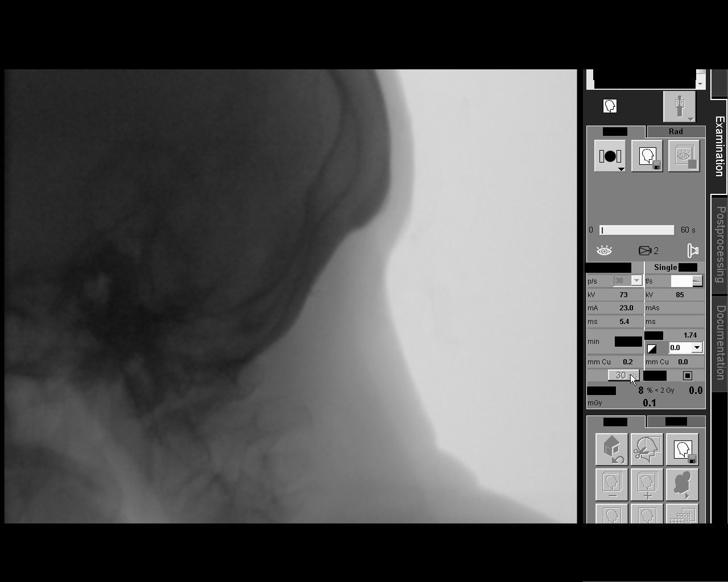

[Series 2: tsp thin · 1 of 207 frames shown (1 of 3)]
[frame 32/207]
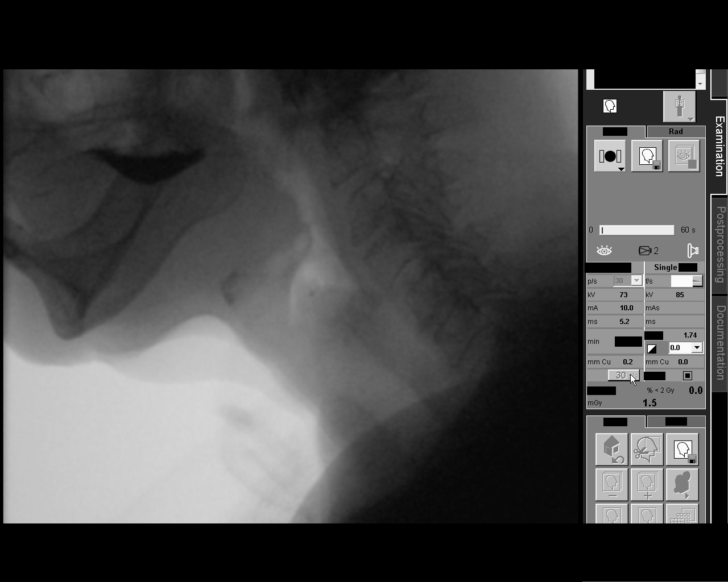

[Series 3: tsp thin · 1 of 328 frames shown (2 of 3)]
[frame 50/328]
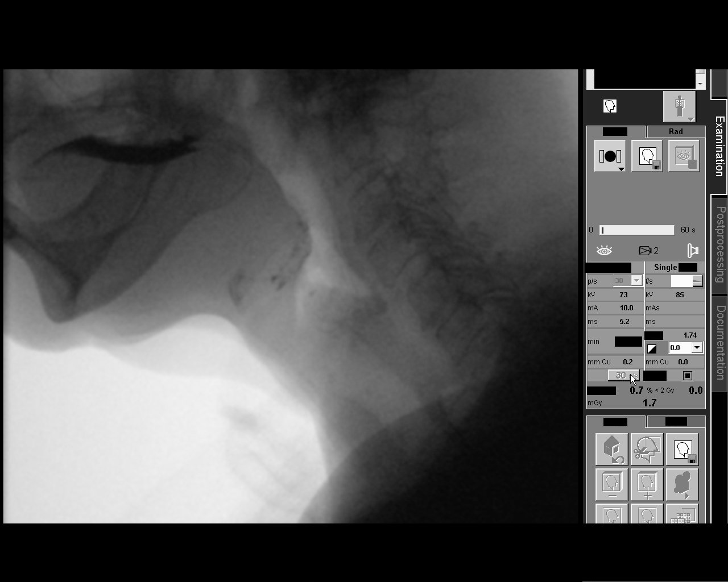

[Series 4: tsp thin · 1 of 145 frames shown (3 of 3)]
[frame 22/145]
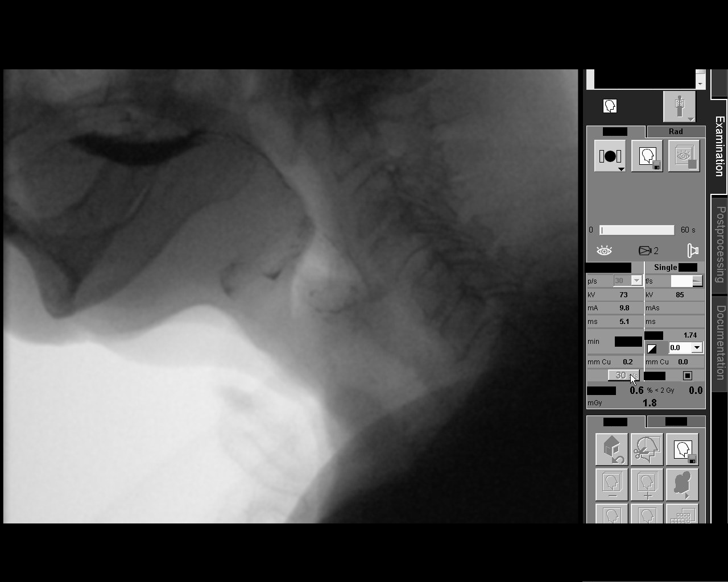

[Series 5: cup sip thin · 2 of 671 frames shown (1 of 2)]
[frame 101/671]
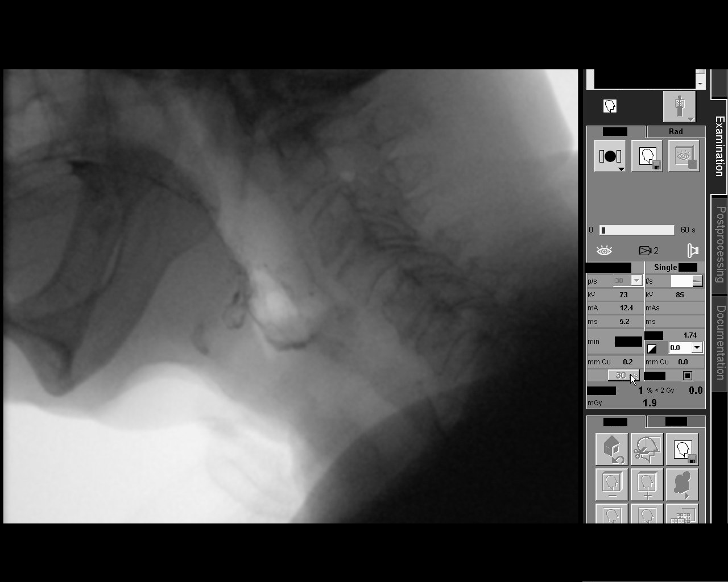
[frame 571/671]
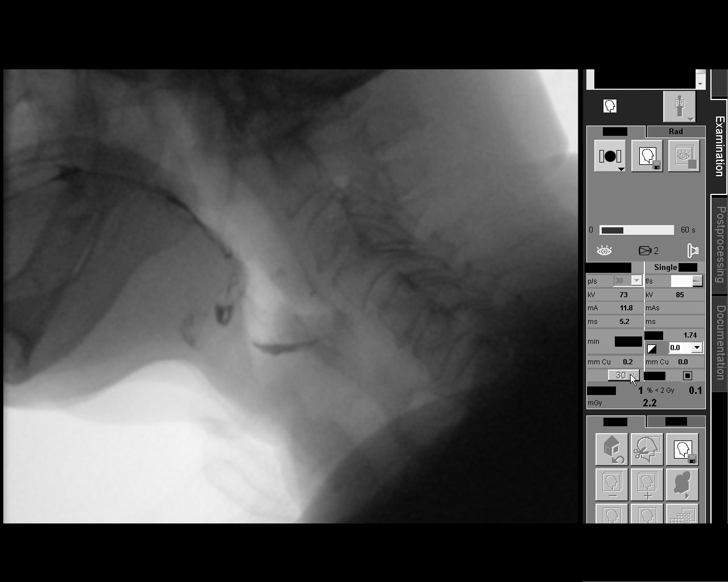

[Series 6: cup sip thin · 1 of 464 frames shown (2 of 2)]
[frame 457/464]
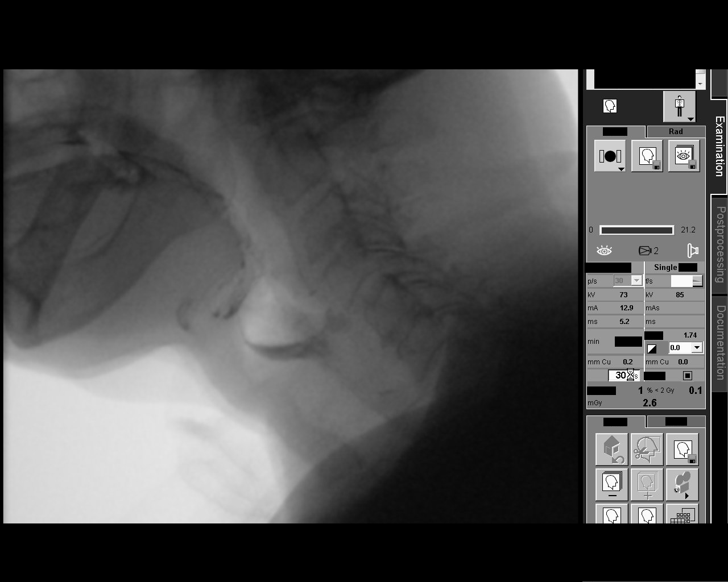

[Series 7: puree · 1 of 633 frames shown (1 of 2)]
[frame 317/633]
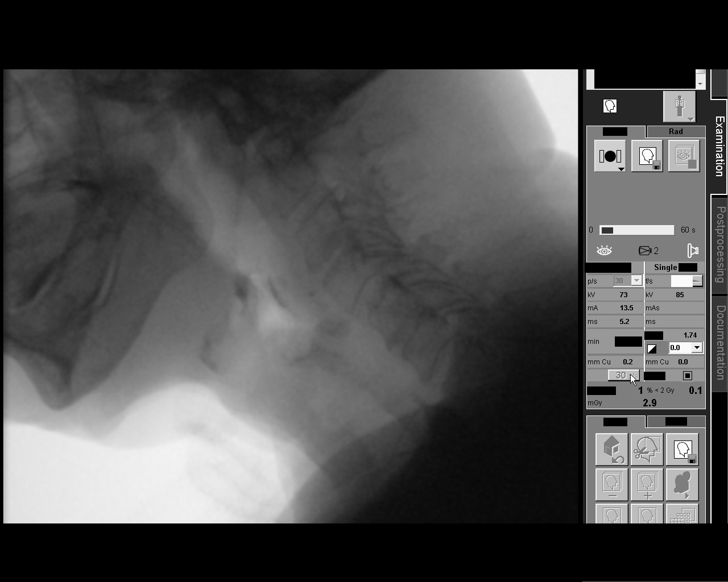

[Series 8: puree · 1 of 828 frames shown (2 of 2)]
[frame 39/828]
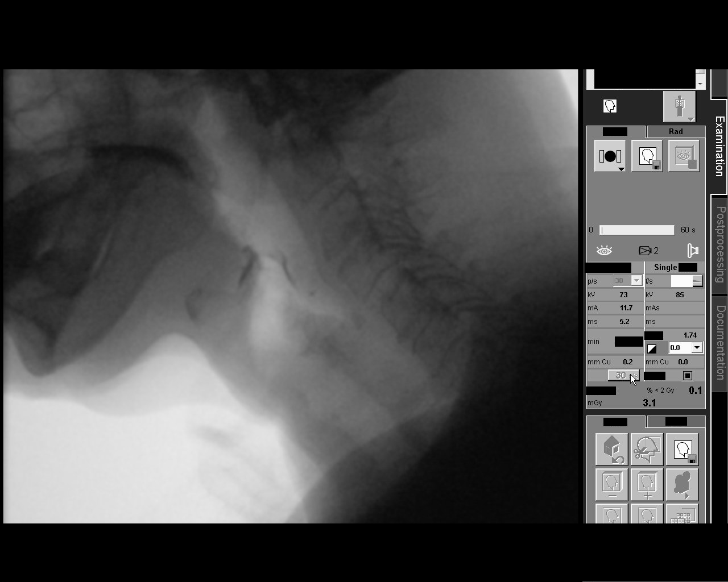

[Series 10: tab thin cup · 1 of 724 frames shown]
[frame 109/724]
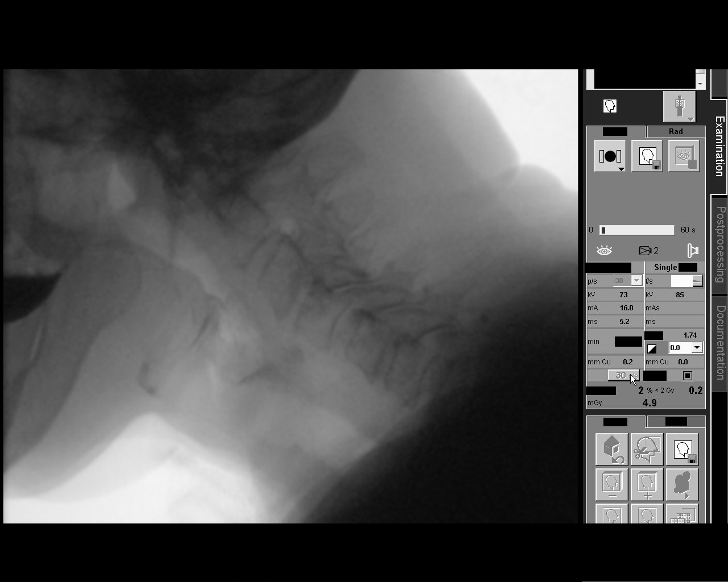

[Series 11: pan · 2 of 117 frames shown (1 of 2)]
[frame 10/117]
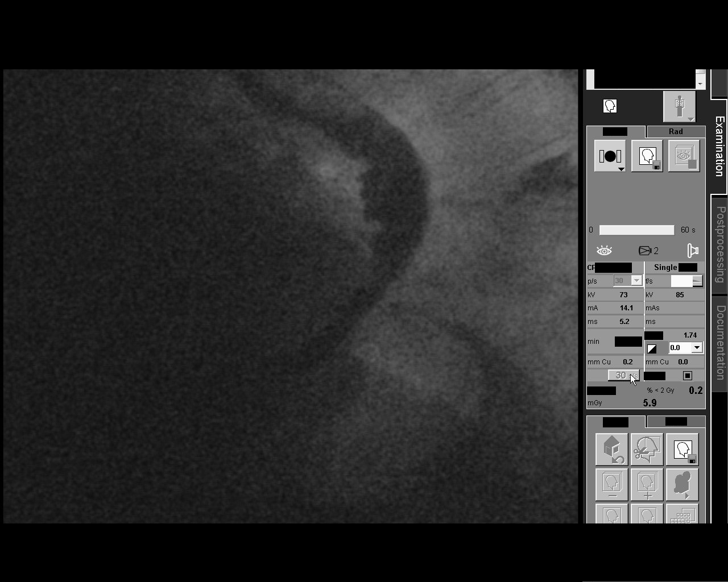
[frame 100/117]
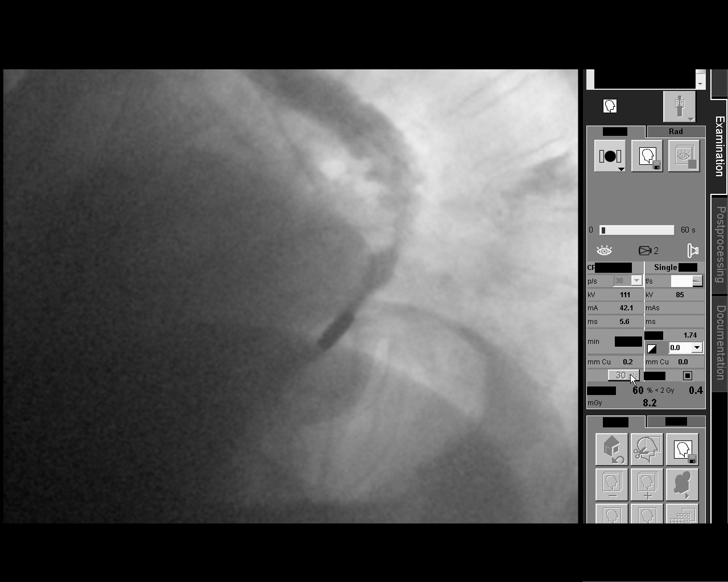

[Series 12: pan · 1 of 88 frames shown (2 of 2)]
[frame 75/88]
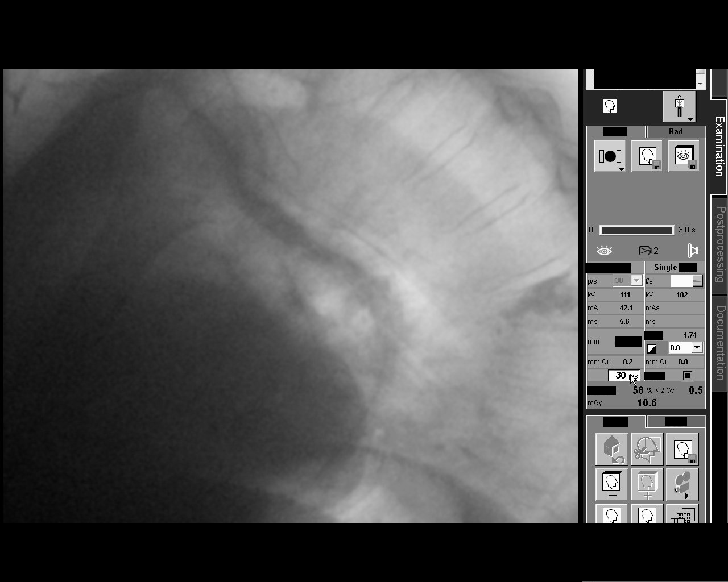

[13 of 24 positions shown; findings below may reference images not displayed]

FINDINGS: Thin barium by teaspoon and cup normal.

Mild piriform residual seen with thin barium by straw.

No laryngeal penetration or aspiration.

Applesauce and cracker consistencies demonstrate significant oral
residuals, taking 3-4 swallows to clear the oral cavity.

No laryngeal penetration, aspiration or residuals.

Patient swallowed a 12.5 mm diameter barium tablet without
difficulty.

Pill passed to the stomach without obstruction, though diffuse
esophageal dysmotility was noted.
IMPRESSION: Mild swallowing abnormalities as above.

Please refer to the Speech Pathologists report for complete details
and recommendations.

## 2023-08-28 ENCOUNTER — Other Ambulatory Visit: Payer: Self-pay

## 2023-08-28 DIAGNOSIS — M810 Age-related osteoporosis without current pathological fracture: Secondary | ICD-10-CM | POA: Insufficient documentation

## 2023-09-01 ENCOUNTER — Telehealth: Payer: Self-pay

## 2023-09-01 NOTE — Telephone Encounter (Signed)
 Auth Submission: DENIED Site of care: Site of care: [facility] Payer: [employer] Medication & CPT/J Code(s) submitted: Prolia (Denosumab) [id number] Route of submission (phone, fax, portal): portal Phone # Fax # Auth type: Buy/Bill PB Units/visits requested: 60mg , q57mnths Reference number: [id number]    Authorization has been DENIED because this is a non-preferred medication through pt's insurance. The denial letter is scanned in the media tab.

## 2023-09-23 ENCOUNTER — Ambulatory Visit (INDEPENDENT_AMBULATORY_CARE_PROVIDER_SITE_OTHER): Payer: 59 | Admitting: Family Medicine

## 2023-09-23 ENCOUNTER — Encounter: Payer: Self-pay | Admitting: Family Medicine

## 2023-09-23 VITALS — BP 149/77 | HR 79 | Ht 62.0 in | Wt 190.0 lb

## 2023-09-23 DIAGNOSIS — G4700 Insomnia, unspecified: Secondary | ICD-10-CM | POA: Diagnosis not present

## 2023-09-23 DIAGNOSIS — Z8659 Personal history of other mental and behavioral disorders: Secondary | ICD-10-CM | POA: Diagnosis not present

## 2023-09-23 DIAGNOSIS — G43109 Migraine with aura, not intractable, without status migrainosus: Secondary | ICD-10-CM | POA: Diagnosis not present

## 2023-09-23 MED ORDER — NURTEC 75 MG PO TBDP: 75.0000 mg | ORAL_TABLET | Freq: Every day | ORAL | 11 refills | Status: DC | PRN

## 2023-09-23 MED ORDER — AJOVY 225 MG/1.5ML ~~LOC~~ SOAJ: 225.0000 mg | SUBCUTANEOUS | 3 refills | Status: DC

## 2023-09-23 NOTE — Patient Instructions (Addendum)
 Below is our plan:  We will start Ajovy every 30 days. We will try Nurtec for abortive therapy. You can take 1 per day up to 8 times a month. Please try to reduce use of Tylenol and Aleve. I advise no more than 1-2 per week. Please continue to work on sleep hygiene. Consider valerian root as sleep aid. Ashwaganda is also shown to be helpful.   Please make sure you are staying well hydrated. I recommend 50-60 ounces daily. Well balanced diet and regular exercise encouraged. Consistent sleep schedule with 6-8 hours recommended.   Please continue follow up with care team as directed.   Follow up with me in 6 months   You may receive a survey regarding today's visit. I encourage you to leave honest feed back as I do use this information to improve patient care. Thank you for seeing me today!   GENERAL HEADACHE INFORMATION:   Natural supplements: Magnesium Oxide or Magnesium Glycinate 500 mg at bed (up to 800 mg daily) Coenzyme Q10 300 mg in AM Vitamin B2- 200 mg twice a day   Add 1 supplement at a time since even natural supplements can have undesirable side effects. You can sometimes buy supplements cheaper (especially Coenzyme Q10) at www.WebmailGuide.co.za or at [facility].  Migraine with aura: There is increased risk for stroke in women with migraine with aura and a contraindication for the combined contraceptive pill for use by women who have migraine with aura. The risk for women with migraine without aura is lower. However other risk factors like smoking are far more likely to increase stroke risk than migraine. There is a recommendation for no smoking and for the use of OCPs without estrogen such as progestogen only pills particularly for women with migraine with aura.Marland Kitchen People who have migraine headaches with auras may be 3 times more likely to have a stroke caused by a blood clot, compared to migraine patients who don't see auras. Women who take hormone-replacement therapy may be 30 percent more  likely to suffer a clot-based stroke than women not taking medication containing estrogen. Other risk factors like smoking and high blood pressure may be  much more important.    Vitamins and herbs that show potential:   Magnesium: Magnesium (250 mg twice a day or 500 mg at bed) has a relaxant effect on smooth muscles such as blood vessels. Individuals suffering from frequent or daily headache usually have low magnesium levels which can be increase with daily supplementation of 400-750 mg. Three trials found 40-90% average headache reduction  when used as a preventative. Magnesium may help with headaches are aura, the best evidence for magnesium is for migraine with aura is its thought to stop the cortical spreading depression we believe is the pathophysiology of migraine aura.Magnesium also demonstrated the benefit in menstrually related migraine.  Magnesium is part of the messenger system in the serotonin cascade and it is a good muscle relaxant.  It is also useful for constipation which can be a side effect of other medications used to treat migraine. Good sources include nuts, whole grains, and tomatoes. Side Effects: loose stool/diarrhea  Riboflavin (vitamin B 2) 200 mg twice a day. This vitamin assists nerve cells in the production of ATP a principal energy storing molecule.  It is necessary for many chemical reactions in the body.  There have been at least 3 clinical trials of riboflavin using 400 mg per day all of which suggested that migraine frequency can be decreased.  All 3  trials showed significant improvement in over half of migraine sufferers.  The supplement is found in bread, cereal, milk, meat, and poultry.  Most Americans get more riboflavin than the recommended daily allowance, however riboflavin deficiency is not necessary for the supplements to help prevent headache. Side effects: energizing, green urine   Coenzyme Q10: This is present in almost all cells in the body and is critical  component for the conversion of energy.  Recent studies have shown that a nutritional supplement of CoQ10 can reduce the frequency of migraine attacks by improving the energy production of cells as with riboflavin.  Doses of 150 mg twice a day have been shown to be effective.   Melatonin: Increasing evidence shows correlation between melatonin secretion and headache conditions.  Melatonin supplementation has decreased headache intensity and duration.  It is widely used as a sleep aid.  Sleep is natures way of dealing with migraine.  A dose of 3 mg is recommended to start for headaches including cluster headache. Higher doses up to 15 mg has been reviewed for use in Cluster headache and have been used. The rationale behind using melatonin for cluster is that many theories regarding the cause of Cluster headache center around the disruption of the normal circadian rhythm in the brain.  This helps restore the normal circadian rhythm.   HEADACHE DIET: Foods and beverages which may trigger migraine Note that only 20% of headache patients are food sensitive. You will know if you are food sensitive if you get a headache consistently 20 minutes to 2 hours after eating a certain food. Only cut out a food if it causes headaches, otherwise you might remove foods you enjoy! What matters most for diet is to eat a well balanced healthy diet full of vegetables and low fat protein, and to not miss meals.   Chocolate, other sweets ALL cheeses except cottage and cream cheese Dairy products, yogurt, sour cream, ice cream Liver Meat extracts (Bovril, Marmite, meat tenderizers) Meats or fish which have undergone aging, fermenting, pickling or smoking. These include: Hotdogs,salami,Lox,sausage, mortadellas,smoked salmon, pepperoni, Pickled herring Pods of broad bean (English beans, Chinese pea pods, [place] (fava) beans, lima and navy beans Ripe avocado, ripe banana Yeast extracts or active yeast preparations such as  Brewer's or Fleishman's (commercial bakes goods are permitted) Tomato based foods, pizza (lasagna, etc.)   MSG (monosodium glutamate) is disguised as many things; look for these common aliases: Monopotassium glutamate Autolysed yeast Hydrolysed protein Sodium caseinate "flavorings" "all natural preservatives" Nutrasweet   Avoid all other foods that convincingly provoke headaches.   Resources: The Dizzy [place] Laundry Your Headache Diet, migrainestrong.com  https://zamora-andrews.com/   Caffeine and Migraine For patients that have migraine, caffeine intake more than 3 days per week can lead to dependency and increased migraine frequency. I would recommend cutting back on your caffeine intake as best you can. The recommended amount of caffeine is 200-300 mg daily, although migraine patients may experience dependency at even lower doses. While you may notice an increase in headache temporarily, cutting back will be helpful for headaches in the long run. For more information on caffeine and migraine, visit: https://americanmigrainefoundation.org/resource-library/caffeine-and-migraine/   Headache Prevention Strategies:   1. Maintain a headache diary; learn to identify and avoid triggers.  - This can be a simple note where you log when you had a headache, associated symptoms, and medications used - There are several smartphone apps developed to help track migraines: Migraine Buddy, Migraine Monitor, Curelator N1-Headache App   Common triggers  include: Emotional triggers: Emotional/Upset family or friends Emotional/Upset occupation Business reversal/success Anticipation anxiety Crisis-serious Post-crisis periodNew job/position   Physical triggers: Vacation Day Weekend Strenuous Exercise High Altitude Location New Move Menstrual Day Physical Illness Oversleep/Not enough sleep Weather changes Light: Photophobia or light sesnitivity  treatment involves a balance between desensitization and reduction in overly strong input. Use dark polarized glasses outside, but not inside. Avoid bright or fluorescent light, but do not dim environment to the point that going into a normally lit room hurts. Consider FL-41 tint lenses, which reduce the most irritating wavelengths without blocking too much light.  These can be obtained at axonoptics.com or theraspecs.com Foods: see list above.   2. Limit use of acute treatments (over-the-counter medications, triptans, etc.) to no more than 2 days per week or 10 days per month to prevent medication overuse headache (rebound headache).     3. Follow a regular schedule (including weekends and holidays): Don't skip meals. Eat a balanced diet. 8 hours of sleep nightly. Minimize stress. Exercise 30 minutes per day. Being overweight is associated with a 5 times increased risk of chronic migraine. Keep well hydrated and drink 6-8 glasses of water per day.   4. Initiate non-pharmacologic measures at the earliest onset of your headache. Rest and quiet environment. Relax and reduce stress. Breathe2Relax is a free app that can instruct you on    some simple relaxtion and breathing techniques. Http://Dawnbuse.com is a    free website that provides teaching videos on relaxation.  Also, there are  many apps that   can be downloaded for "mindful" relaxation.  An app called YOGA NIDRA will help walk you through mindfulness. Another app called Calm can be downloaded to give you a structured mindfulness guide with daily reminders and skill development. Headspace for guided meditation Mindfulness Based Stress Reduction Online Course: www.palousemindfulness.com Cold compresses.   5. Don't wait!! Take the maximum allowable dosage of prescribed medication at the first sign of migraine.   6. Compliance:  Take prescribed medication regularly as directed and at the first sign of a migraine.   7. Communicate:  Call your  physician when problems arise, especially if your headaches change, increase in frequency/severity, or become associated with neurological symptoms (weakness, numbness, slurred speech, etc.). Proceed to emergency room if you experience new or worsening symptoms or symptoms do not resolve, if you have new neurologic symptoms or if headache is severe, or for any concerning symptom.   8. Headache/pain management therapies: Consider various complementary methods, including medication, behavioral therapy, psychological counselling, biofeedback, massage therapy, acupuncture, dry needling, and other modalities.  Such measures may reduce the need for medications. Counseling for pain management, where patients learn to function and ignore/minimize their pain, seems to work very well.   9. Recommend changing family's attention and focus away from patient's headaches. Instead, emphasize daily activities. If first question of day is 'How are your headaches/Do you have a headache today?', then patient will constantly think about headaches, thus making them worse. Goal is to re-direct attention away from headaches, toward daily activities and other distractions.   10. Helpful Websites: www.AmericanHeadacheSociety.org PatentHood.ch www.headaches.org TightMarket.nl www.achenet.org

## 2023-09-23 NOTE — Progress Notes (Signed)
 Chief Complaint  Patient presents with   Follow-up    Pt in 2 with daughter Pt here for Migraine f/u Pt states headache daily Pt states headache starts a 2pm everyday Pt states headache pain starts in back of head and travels to left temporal Pt states pain level 10     HISTORY OF PRESENT ILLNESS:  09/23/23 ALL:  RETTA PITCHER is a 78 y.o. female here today for follow up for migraines with aura. Previously followed by Dr Gerilyn Pilgrim for dementia but has not been evaluated by our office. She was last seen by Dr Delena Bali 04/2022 and reported worsening headaches. She was started on [place]. She called 06/2022 reporting tongue swelling but unclear if related to [place]. She was switched to [employer]   Since, she reports no improvement. She stopped Emgality about a month ago as she didn't feel it was working. She continues to have daily headaches. Described as a tension, achy pain in the right temporal and occipital area. She has migrainous symptoms of light and sound sensitivity on occasion. She takes Tylenol and Aleve twice daily. She does not sleep well. She may get 3 hours of sleep at night. She has had chronic insomnia for years. Takes melatonin 9mg  at bedtime. Previous sleep study reportedly negative for sleep apnea. Recently stopped seeing psychiatry and psychology. She reports being told she did not need to return. She denies concerns of anxiety or depression but tells me she has seen psychiatry off and on for years. She has been hospitalized twice for amnesic events lasting approx 2 weeks each time. Once was admitted to behavioral health and second time was admitted to SNF. She lives at home with her daughter now. She rarely drives.   Not taking memory support agents. Last MRI brain showed 1. Mild chronic white matter disease, most likely related to chronic microangiopathy. 2. Moderate parenchymal volume loss, more pronounced in the bilateral parietal regions and right perisylvian. She  continues close follow up with PCP.   Prior Therapies                                 Prevention Aimovig 70 mg monthly Lisinopril - cough Losartan 100 mg daily Atenolol - fatigue Topamax 100 mg daily Gabapentin 300 mg QHS Effexor 225 mg daily Cymbalta - rash Botox Qulipta ? Tongue swelling   Rescue Goody Powder Seroquel 25 mg PRN Ubrelvy Imitrex   HISTORY (copied from Dr Quentin Mulling previous note)  Medical co-morbidities: HTN, CHF, OSA, hyperparathyroidism, prediabetes, asthma, CKD 3, HLD, depression, anxiety, dementia   The patient presents for evaluation of migraines which have been present for several years. They have been worsening in frequency since March. Migraines are described as frontal or left retro-orbital pain with associated photophobia, phonophobia, and nausea. She does occasionally have an aura of flashing lights in her vision. Headaches can last for several hours at a time.   She follows with Neurology for dementia and migraines (last seen 03/05/22) and was has tried multiple preventive medications including Botox. She was most recently on Aimovig for 2 years, but stopped it as she did not think it was effective. She is currently taking Marlin Canary Powder nearly every day for her headaches.   Headache History: Aura: lights in vision Location: front, left retro-orbital, neck Associated Symptoms:             Photophobia: yes  Phonophobia: yes             Nausea: yes Worse with activity?: yes Duration of headaches: several hours   Headache days per month: 14 Headache free days per month: 16   Current Treatment: Abortive Goody Powder   Preventative none   Prior Therapies                                 Prevention Aimovig 70 mg monthly Lisinopril - cough Losartan 100 mg daily Atenolol - fatigue Topamax 100 mg daily Gabapentin 300 mg QHS Effexor 225 mg daily Cymbalta - rash Botox   Rescue Goody Powder Seroquel 25 mg PRN   REVIEW OF SYSTEMS:  Out of a complete 14 system review of symptoms, the patient complains only of the following symptoms, headaches, intermittent memory loss and all other reviewed systems are negative.   ALLERGIES: Allergies  Allergen Reactions   Elavil [Amitriptyline] Other (See Comments)    Felt really nervous and felt like something was hold her feet or arm and/ or AM headache on it and back on it and went away off it.    Abilify [Aripiprazole] Other (See Comments)    Dystonic reaction   Codeine Hives, Nausea Only and Other (See Comments)    Feels funny   Latex Hives   Losartan     Tongue Swelling   Penicillins Hives, Itching and Other (See Comments)    Has patient had a PCN reaction causing immediate rash, facial/tongue/throat swelling, SOB or lightheadedness with hypotension: Yes Has patient had a PCN reaction causing severe rash involving mucus membranes or skin necrosis: No Has patient had a PCN reaction that required hospitalization No Has patient had a PCN reaction occurring within the last 10 years: No If all of the above answers are "NO", then may proceed with Cephalosporin use.    Polyethylene Glycol Other (See Comments)    Per allergy test   Spironolactone     Hyperkalemia   Sulfonamide Derivatives Nausea And Vomiting   Cymbalta [Duloxetine Hcl] Rash   Remeron [Mirtazapine] Other (See Comments)    Caused lots of strange, weird, crazy dreams.     HOME MEDICATIONS: Outpatient Medications Prior to Visit  Medication Sig Dispense Refill   albuterol (PROVENTIL HFA;VENTOLIN HFA) 108 (90 BASE) MCG/ACT inhaler Inhale 2 puffs into the lungs every 6 (six) hours as needed for wheezing or shortness of breath.      amLODipine (NORVASC) 10 MG tablet Take 10 mg by mouth daily.     AUVELITY 45-105 MG TBCR Take 1 tablet twice a day by oral route.     azelastine (ASTELIN) 0.1 % nasal spray 1-2 sprays each nostril twice daily as needed 30 mL 5   Azelastine-Fluticasone 137-50 MCG/ACT SUSP      BREZTRI  AEROSPHERE 160-9-4.8 MCG/ACT AERO Inhale into the lungs.     Calcium Carbonate-Vit D-Min (CALCIUM 1200 PO) Take by mouth daily.     carvedilol (COREG) 3.125 MG tablet TAKE ONE TABLET (3.125MG  TOTAL) BY MOUTHTWO TIMES DAILY WITH A MEAL 60 tablet 6   cetirizine (ZYRTEC) 10 MG tablet TAKE ONE TABLET (10MG  TOTAL0 BY MOUTH DAILY AS NEEDED FOR ALLERGIES (ITCHING) 30 tablet 5   Cyanocobalamin (VITAMIN B12 PO) Take by mouth.     famotidine (PEPCID) 20 MG tablet TAKE TWO TABLETS (40MG  TOTAL) BY MOUTH AT BEDTIME     Ferrous Sulfate (IRON PO) Take by mouth daily.  fluorometholone (FML) 0.1 % ophthalmic suspension SMARTSIG:In Eye(s)     fluticasone (FLONASE) 50 MCG/ACT nasal spray USE 2 SPRAYS IN BOTH NOSTRILS  DAILY 48 g 1   fluticasone 0.05%-ketoconazole 2% 1:1 cream mixture Apply topically.     furosemide (LASIX) 40 MG tablet TAKE ONE TABLET (40MG  TOTAL) BY MOUTH DAILY 90 tablet 1   hydrALAZINE (APRESOLINE) 100 MG tablet Take 1 tablet (100 mg total) by mouth 3 (three) times daily. 270 tablet 1   hydrOXYzine (ATARAX) 25 MG tablet      ketoconazole (NIZORAL) 2 % cream Apply topically 2 (two) times daily as needed.     levothyroxine (SYNTHROID) 25 MCG tablet Take 25 mcg by mouth daily.     lovastatin (MEVACOR) 40 MG tablet Take 40 mg by mouth at bedtime.     Melatonin 3 MG CAPS Take by mouth.     meloxicam (MOBIC) 7.5 MG tablet Take 1 tablet (7.5 mg total) by mouth daily. 30 tablet 5   methocarbamol (ROBAXIN) 500 MG tablet TAKE ONE (1) TABLET BY MOUTH 3 TIMES DAILY 60 tablet 1   Multiple Vitamins-Minerals (CENTRUM SILVER 50+WOMEN PO) Take by mouth daily.     pantoprazole (PROTONIX) 40 MG tablet TAKE ONE TABLET (40MG  TOTAL) BY MOUTH TWO TIMES DAILY 60 tablet 5   potassium chloride SA (KLOR-CON M) 20 MEQ tablet Take 1 tablet (20 mEq total) by mouth daily. 30 tablet 6   RESTASIS 0.05 % ophthalmic emulsion 1 drop 2 (two) times daily.     triamcinolone cream (KENALOG) 0.1 % Apply topically 2 (two) times  daily.     Ubrogepant (UBRELVY) 100 MG TABS TAKE ONE TABLET (100MG ) BY MOUTH AS NEEDED FOR MIGRAINES. MAY REPEAT A DOSE IN 2 HOURS IF HEADACHE PERSISTS. MAX DOSE 2 TABLETS IN 24 HOURS. 16 tablet 3   No facility-administered medications prior to visit.     PAST MEDICAL HISTORY: Past Medical History:  Diagnosis Date   Adenomatous colon polyp 2006   excised in 2006 & 2010Due surveillance 06/2013   Anxiety    Anxiety and depression    Asthma    Breast mass    right nipple bengn mass per patient   Breast tumor    Chest discomfort    Chronic back pain    Chronic constipation    Collagen vascular disease (HCC)    Complication of anesthesia    Depression    Diverticulosis    Fibromyalgia    Gastroesophageal reflux disease    Hiatal hernia    History of benign esophageal tumor 2010   granular cell esophageal tumor (Dx 06/2008), resected via EMR 2011, due repeat EGD 02/2012   Hyperlipidemia    Hypertension    Insomnia    Migraines    PONV (postoperative nausea and vomiting)    Psychosis (HCC)    Renal insufficiency    Sleep apnea    Stop Bang score of 5. Pt said she was told by Dr. Gerilyn Pilgrim that she had "a little bit" of sleep apena, but not bad enough to treat.   Thyroid nodule    Tumor of esophagus      PAST SURGICAL HISTORY: Past Surgical History:  Procedure Laterality Date   ABDOMINAL HYSTERECTOMY     BALLOON DILATION  08/23/2021   Procedure: BALLOON DILATION;  Surgeon: Lanelle Bal, DO;  [facility];  Service: Endoscopy;;   BIOPSY  10/14/2019   Procedure: BIOPSY;  Surgeon: Corbin Ade, MD;  [facility];  Service: Endoscopy;;   BRAVO PH STUDY  12/11/2011   Procedure: BRAVO PH STUDY;  Surgeon: Corbin Ade, MD;  [facility];  Service: Endoscopy;  Laterality: N/A;   BREAST EXCISIONAL BIOPSY  1990s, 2012   Left x2-sclerosing ductal papilloma-2012   CHOLECYSTECTOMY N/A 04/09/2013   Procedure: LAPAROSCOPIC CHOLECYSTECTOMY;   Surgeon: Dalia Heading, MD;  Location: [facility];  Service: General;  Laterality: N/A;   COLONOSCOPY  06/2008, 06/2011   sigmoid tics, tubular adenoma; 2013: anal canal hemorrhoids   COLONOSCOPY  07/10/2011   Anal canal hemorrhoids likely the cause of hematochezia in the setting of constipation; otherwise normal rectum ;submucosal  petechiae in left colon of doubtful clinical significance; otherwise, normal colon   COLONOSCOPY WITH PROPOFOL N/A 11/10/2017   cancelled in pre-op   COLONOSCOPY WITH PROPOFOL N/A 07/23/2018   Procedure: COLONOSCOPY WITH PROPOFOL;  Surgeon: Corbin Ade, MD; 1 tubular adenoma, diverticulosis in the sigmoid and descending colon, nonbleeding internal hemorrhoids.  No recommendations to repeat due to age.   ESOPHAGEAL BRUSHING  08/23/2021   Procedure: ESOPHAGEAL BRUSHING;  Surgeon: Lanelle Bal, DO;  [facility];  Service: Endoscopy;;   ESOPHAGEAL DILATION N/A 05/08/2015   Procedure: ESOPHAGEAL DILATION;  Surgeon: Corbin Ade, MD;  Location: [facility];  Service: Endoscopy;  Laterality: N/AElease Hashimoto 54/56   ESOPHAGOGASTRODUODENOSCOPY  12/11/2011   WGN:[id number] Schatzki's ring; otherwise normal/Small hiatal hernia. Antral and bulbar erosions   ESOPHAGOGASTRODUODENOSCOPY (EGD) WITH PROPOFOL N/A 05/08/2015   Dr. Jena Gauss: mild erosive reflux esophagitis, non-critical Schatzki's ring s/p dilation. Hiatal hernia.    ESOPHAGOGASTRODUODENOSCOPY (EGD) WITH PROPOFOL N/A 10/14/2019   Procedure: ESOPHAGOGASTRODUODENOSCOPY (EGD) WITH PROPOFOL;  Surgeon: Corbin Ade, MD;  Nonobstructing Schatzki ring, small hiatal hernia, abnormal gastric mucosa s/p biopsied, normal first and second portion of the duodenum.  Pathology with chronic inactive gastritis, no H. pylori.   ESOPHAGOGASTRODUODENOSCOPY (EGD) WITH PROPOFOL N/A 08/23/2021   Surgeon: Lanelle Bal, DO;   white speckled mucosa in the esophagus s/p cells obtained for cytology (KOH prep negative), medium-sized  hiatal hernia, grade A reflux esophagitis, mild Schatzki's ring s/p dilation, normal examined stomach and duodenum.   EUS  08/2010   Dr [facility] with EGD. Retained food. No recurrent esophageal lesion, bx negative.   IR KYPHO THORACIC WITH BONE BIOPSY  10/17/2020   IR RADIOLOGIST EVAL & MGMT  09/12/2020   LUNG BIOPSY     negative   PARATHYROIDECTOMY  09/2015   [facility].    PARATHYROIDECTOMY     POLYPECTOMY  07/23/2018   Procedure: POLYPECTOMY;  Surgeon: Corbin Ade, MD;  [facility];  Service: Endoscopy;;  colon   RIGHT OOPHORECTOMY     benign disease   SHOULDER ARTHROSCOPY     Right; bone spurs removed   TONSILLECTOMY     TOTAL KNEE ARTHROPLASTY  2002   Right; previous arthroscopic surgery     FAMILY HISTORY: Family History  Problem Relation Age of Onset   Heart attack Mother    Depression Mother    Anxiety disorder Mother    Migraines Mother    Stroke Father    Alcohol abuse Father    Migraines Father    Dementia Maternal Uncle    Colon cancer Paternal Aunt    Colon cancer Paternal Uncle    ADD / ADHD Neg Hx    Bipolar disorder Neg Hx    Drug abuse Neg Hx    OCD Neg Hx  Paranoid behavior Neg Hx    Schizophrenia Neg Hx    Seizures Neg Hx    Sexual abuse Neg Hx    Physical abuse Neg Hx      SOCIAL HISTORY: Social History   Socioeconomic History   Marital status: Single    Spouse name: Not on file   Number of children: 1   Years of education: Not on file   Highest education level: Not on file  Occupational History   Occupation: disabled    Employer: RETIRED  Tobacco Use   Smoking status: Never   Smokeless tobacco: Never  Vaping Use   Vaping status: Never Used  Substance and Sexual Activity   Alcohol use: No    Alcohol/week: 0.0 standard drinks of alcohol   Drug use: No   Sexual activity: Never  Other Topics Concern   Not on file  Social History Narrative   Pt lives with daughter    Retired    [employer] 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  Generalized: Well developed, in no acute distress  Cardiology: normal rate and rhythm, no murmur auscultated  Respiratory: clear to auscultation bilaterally    Neurological examination  Mentation: Alert oriented to time, place, history taking. Follows all commands speech and language fluent Cranial nerve II-XII: Pupils were equal round reactive to light. Extraocular movements were full, visual field were full on confrontational test. Facial sensation and strength were normal. Uvula tongue midline. Head turning and shoulder shrug  were normal and symmetric. Motor: The motor testing reveals 5 over 5 strength of all 4 extremities. Good symmetric motor tone is noted throughout.  Gait and station: Gait is normal.    DIAGNOSTIC DATA (LABS, IMAGING, TESTING) - I reviewed patient records, labs, notes, testing and imaging myself where available.  Lab Results  Component Value Date   WBC 5.7 09/24/2022   HGB 13.2 09/24/2022   HCT 41.4 09/24/2022   MCV 78 (L) 09/24/2022   PLT 252 09/24/2022      Component Value Date/Time   NA 138 03/18/2022 1017   K 4.6 03/18/2022 1017   CL 101 03/18/2022 1017   CO2 18 (L) 03/18/2022 1017   GLUCOSE 102 (H) 03/18/2022 1017   GLUCOSE 104 (H) 02/12/2022 1216   BUN 18 03/18/2022 1017   CREATININE 1.10 (H) 03/18/2022 1017   CREATININE 0.84 09/26/2021 1524   CALCIUM 10.3 03/18/2022 1017   PROT 6.1 09/10/2021 1425   ALBUMIN 4.2 08/21/2021 0213   AST 17 09/10/2021 1425   ALT 19 09/10/2021 1425   ALKPHOS 77 08/21/2021 0213   BILITOT 0.6 09/10/2021 1425    GFRNONAA >60 02/12/2022 1216   GFRNONAA 56 (L) 07/26/2015 0934   GFRAA >60 10/11/2019 1440   GFRAA 64 07/26/2015 0934   Lab Results  Component Value Date   CHOL 147 06/01/2019   HDL 65 06/01/2019   LDLCALC 66 06/01/2019   TRIG 79 06/01/2019   CHOLHDL 2.3 06/01/2019   Lab Results  Component Value Date   HGBA1C 5.6 08/21/2021  No results found for: "VITAMINB12" Lab Results  Component Value Date   TSH 0.454 09/24/2022        No data to display               No data to display           ASSESSMENT AND PLAN  78 y.o. year old female  has a past medical history of Adenomatous colon polyp (2006), Anxiety, Anxiety and depression, Asthma, Breast mass, Breast tumor, Chest discomfort, Chronic back pain, Chronic constipation, Collagen vascular disease (HCC), Complication of anesthesia, Depression, Diverticulosis, Fibromyalgia, Gastroesophageal reflux disease, Hiatal hernia, History of benign esophageal tumor (2010), Hyperlipidemia, Hypertension, Insomnia, Migraines, PONV (postoperative nausea and vomiting), Psychosis (HCC), Renal insufficiency, Sleep apnea, Thyroid nodule, and Tumor of esophagus. here with    Migraine with aura and without status migrainosus, not intractable  Insomnia, unspecified type  History of depression  YATZARY MERRIWEATHER reports daily headaches continue. Migraines are less frequent than in years past. We will try Ajovy every 30 days and Nurtec as needed for abortive therapy. Consider Vyepti infusions if Ajovy not effective. Discussed appropriate dosing for abortive medications. Healthy lifestyle habits encouraged. Sleep hygiene discussed. Advised to try valerian root OTC. Continue discussion with PCP. She will follow up with PCP as directed. She will return to see me in 6 months, sooner if needed. She verbalizes understanding and agreement with this plan.   No orders of the defined types were placed in this encounter.    Meds ordered this encounter   Medications   Fremanezumab-vfrm (AJOVY) 225 MG/1.5ML SOAJ    Sig: Inject 225 mg into the skin every 30 (thirty) days.    Dispense:  4.5 mL    Refill:  3    Supervising Provider:   [place] Fret [[id number]]   Rimegepant Sulfate (NURTEC) 75 MG TBDP    Sig: Take 1 tablet (75 mg total) by mouth daily as needed (take for abortive therapy of migraine, no more than 1 tablet in 24 hours or 10 per month).    Dispense:  8 tablet    Refill:  11    Supervising Provider:   [place]son Fret [id number]    I spent 30 minutes of face-to-face and non-face-to-face time with patient.  This included previsit chart review, lab review, study review, order entry, electronic health record documentation, patient education.   Shawnie Dapper, MSN, FNP-C 09/23/2023, 2:54 PM  [facility] [address] [place], [place] [address] [phone]

## 2023-09-29 ENCOUNTER — Other Ambulatory Visit (HOSPITAL_COMMUNITY): Payer: Self-pay

## 2023-09-29 ENCOUNTER — Telehealth: Payer: Self-pay

## 2023-09-29 ENCOUNTER — Telehealth: Payer: Self-pay | Admitting: *Deleted

## 2023-09-29 ENCOUNTER — Encounter: Payer: Self-pay | Admitting: Internal Medicine

## 2023-09-29 ENCOUNTER — Ambulatory Visit (INDEPENDENT_AMBULATORY_CARE_PROVIDER_SITE_OTHER): Payer: 59 | Admitting: Internal Medicine

## 2023-09-29 ENCOUNTER — Telehealth: Payer: Self-pay | Admitting: Family Medicine

## 2023-09-29 VITALS — BP 132/78 | HR 87 | Temp 98.0°F | Resp 16 | Ht 61.0 in | Wt 192.5 lb

## 2023-09-29 DIAGNOSIS — T783XXD Angioneurotic edema, subsequent encounter: Secondary | ICD-10-CM | POA: Diagnosis not present

## 2023-09-29 DIAGNOSIS — J310 Chronic rhinitis: Secondary | ICD-10-CM

## 2023-09-29 DIAGNOSIS — R21 Rash and other nonspecific skin eruption: Secondary | ICD-10-CM

## 2023-09-29 DIAGNOSIS — K146 Glossodynia: Secondary | ICD-10-CM

## 2023-09-29 DIAGNOSIS — L299 Pruritus, unspecified: Secondary | ICD-10-CM | POA: Diagnosis not present

## 2023-09-29 MED ORDER — LEVOCETIRIZINE DIHYDROCHLORIDE 5 MG PO TABS: 5.0000 mg | ORAL_TABLET | Freq: Two times a day (BID) | ORAL | 5 refills | Status: DC | PRN

## 2023-09-29 MED ORDER — AZELASTINE HCL 0.1 % NA SOLN: NASAL | 5 refills | Status: AC

## 2023-09-29 MED ORDER — FLUTICASONE PROPIONATE 50 MCG/ACT NA SUSP: 2.0000 | Freq: Every day | NASAL | 1 refills | Status: AC

## 2023-09-29 NOTE — Telephone Encounter (Signed)
 Pharmacy Patient Advocate Encounter   Received notification from Physician's Office that prior authorization for Nurtec 75MG  dispersible tablets is required/requested.   Insurance verification completed.   The patient is insured through [facility] .   Per test claim: PA required; PA submitted to above mentioned insurance via CoverMyMeds Key/confirmation #/EOC [id number] Status is pending

## 2023-09-29 NOTE — Telephone Encounter (Signed)
 Called and spoke with patient. I will print AVS and mail to her address.

## 2023-09-29 NOTE — Telephone Encounter (Signed)
 Pt states Amy, NP failed to give her paperwork that she mentioned to her about her migraines.  Pt is asking for a call to f/u on that paperwork being made available for her.

## 2023-09-29 NOTE — Telephone Encounter (Signed)
 Pharmacy Patient Advocate Encounter   Received notification from Physician's Office that prior authorization for Arnetha Massy is required/requested.   Insurance verification completed.   The patient is insured through [facility] .   Per test claim:  Aimovig, Emgality, Bennie Pierini is preferred by the insurance.  If suggested medication is appropriate, Please send in a new RX and discontinue this one. If not, please advise as to why it's not appropriate so that we may request a Prior Authorization. Please note, some preferred medications may still require a PA.  If the suggested medications have not been trialed and there are no contraindications to their use, the PA will not be submitted, as it will not be approved.

## 2023-09-29 NOTE — Telephone Encounter (Signed)
 Called pt back. Amy said she was going to provide her paperwork to read about medications she can buy over the counter for headache treatment but did not receive this. Aware I will send to Amy to get copy. She is ok for [place] to mail to her and call back and LVM with update. She will be out most of the day.  Ajovy/Nurtec also need PA. Insurance will not cover per pt. Aware I will send request to PA team to complete.for her to see if they will approve.

## 2023-09-29 NOTE — Telephone Encounter (Signed)
 I was asked to do a PA-looks like Nurtec was also discontinued and is not active on med list-please advise

## 2023-09-29 NOTE — Telephone Encounter (Signed)
 This was on med list as well when I routed earlier. Someone not from our office d/c'd off her list. Amy's last note states for her to take prn.

## 2023-09-29 NOTE — Telephone Encounter (Signed)
 I was asked to do PA for [name] but it looks like it was discontinued by Amy due to PT preference and is not on active med list-Please advise.

## 2023-09-29 NOTE — Telephone Encounter (Signed)
 Pharmacy Patient Advocate Encounter   Received notification from Physician's Office that prior authorization for Ajovy is required/requested.   Insurance verification completed.   The patient is insured through [facility] .   Per test claim: PA required; PA submitted to above mentioned insurance via CoverMyMeds Key/confirmation #/EOC [id number] Status is pending

## 2023-09-29 NOTE — Telephone Encounter (Signed)
 Hi Monica, it was on med list when I routed earlier. Looks like someone not from our office d/c'd rx from med list? Last office note, Amy wrote for her to start on this

## 2023-09-29 NOTE — Telephone Encounter (Signed)
 Pt states PA Ajovy/Nurtec needed

## 2023-09-29 NOTE — Progress Notes (Signed)
**Note De-Identified Donna Obfuscation**  FOLLOW UP Date of Service/Encounter:  09/29/23   Subjective:  [name] (DOB: 13-Jan-1946) is a 78 y.o. female who [facility] for follow up for angioedema, chronic rhinitis, pruritus/rashes.   History obtained from: chart review and patient. Last seen on 03/31/2023 for rash/pruritus, SPT negative in past, discussed Derm follow up.  Chronic rhinitis on Flonase, Azelastine, Zyrtec.  Hx of angioedema, keep track of symptoms, Can increase to Zyrtec 10mg  BID and Pepcid 20mg  BID, normal lab testing in past.   Reports still having chronic itch.  Zyrtec does not seem to help.  Saw Dermatology who gave her Clobetasol and Ketoconazole, not sure if they helped.    Also notes tongue burning, no matter what she eats or drinks.  Using sensodyne.  No tongue swelling episodes since last visit.    Has some sneezing and runny nose, Uses nose sprays (Flonase/Azelastine) PRN and Zyrtec PRN with some relief.   Past Medical History: Past Medical History:  Diagnosis Date   Adenomatous colon polyp 2006   excised in 2006 & 2010Due surveillance 06/2013   Anxiety    Anxiety and depression    Asthma    Breast mass    right nipple bengn mass per patient   Breast tumor    Chest discomfort    Chronic back pain    Chronic constipation    Collagen vascular disease (HCC)    Complication of anesthesia    Depression    Diverticulosis    Fibromyalgia    Gastroesophageal reflux disease    Hiatal hernia    History of benign esophageal tumor 2010   granular cell esophageal tumor (Dx 06/2008), resected Donna EMR 2011, due repeat EGD 02/2012   Hyperlipidemia    Hypertension    Insomnia    Migraines    PONV (postoperative nausea and vomiting)    Psychosis (HCC)    Renal insufficiency    Sleep apnea    Stop Bang score of 5. Pt said she was told by Dr. Gerilyn Pilgrim that she had "a little bit" of sleep apena, but not bad enough to treat.   Thyroid nodule    Tumor of  esophagus     Objective:  BP 132/78   Pulse 87   Temp 98 F (36.7 C)   Resp 16   Ht 5\' 1"  (1.549 m)   Wt 192 lb 8 oz (87.3 kg)   SpO2 97%   BMI 36.37 kg/m  Body mass index is 36.37 kg/m. Physical Exam: GEN: alert, well developed HEENT: clear conjunctiva, nose without inferior turbinate hypertrophy, pink nasal mucosa, slight clear rhinorrhea, no cobblestoning HEART: regular rate and rhythm, no murmur LUNGS: clear to auscultation bilaterally, no coughing, unlabored respiration SKIN: no apparent rashes   Assessment:   1. Chronic rhinitis   2. Rash and nonspecific skin eruption   3. Angioedema, subsequent encounter   4. Pruritus   5. Tongue burning sensation     Plan/Recommendations:  Rash/Pruritus (Itching) - Unclear etiology. Will try changing anti histamines. Continue follow up with Dermatology  - Do a daily soaking tub bath in warm water for 10-15 minutes.  - Use a gentle, unscented cleanser at the end of the bath (such as Dove unscented bar or baby wash, or Aveeno sensitive body wash). Then rinse, pat half-way dry, and apply a gentle, unscented moisturizer cream or ointment (Cerave, Cetaphil, Eucerin, Aveeno)  all over while still damp. Dry skin makes the itching worse.  The skin should be moisturized with a gentle, unscented moisturizer at least twice daily.  - Use only unscented liquid laundry detergent. - You can also try Sarna anti-itch lotion as needed.   - Use Xyzal 5mg  up to twice daily.  This replaces Zyrtec.   Idiopathic Angioedema (Swelling) - Controlled  - Keep track of episodes and take pictures of when it is swollen vs normal.  - 09/2022: normal blood counts, C1 esterase function, TSH, tryptase, complements, ANA, alpha gal.  - At this time etiology of swelling is unknown. Swelling can be caused by a variety of different triggers including illness/infection, exercise, pressure, vibrations, extremes of temperature to name a few however majority of the time there  is no identifiable trigger.  -Continue Xyzal 5mg  daily.  If symptoms worsen, increase to Xyzal 5mg  twice daily and Pepcid 20mg  twice daily. Stop Zyrtec.   Tongue Burning - Consider switching toothpaste.   Chronic Rhinitis: - Uncontrolled, switching anti histamines.  - Positive skin test 08/2022: none - Use nasal saline rinses before nose sprays such as with Neilmed Sinus Rinse.  Use distilled water.   - Use Flonase 2 sprays each nostril daily. Aim upward and outward. - Use Azelastine 1-2 sprays each nostril twice daily as needed for runny nose, congestion, drainage, sneezing. Aim upward and outward. - Use Xyzal 5 mg daily    Return in about 1 year (around 09/28/2024).  Alesia Morin, MD [facility]

## 2023-09-29 NOTE — Patient Instructions (Addendum)
 Rash/Pruritus (Itching) - Continue follow up with Dermatology  - Do a daily soaking tub bath in warm water for 10-15 minutes.  - Use a gentle, unscented cleanser at the end of the bath (such as Dove unscented bar or baby wash, or Aveeno sensitive body wash). Then rinse, pat half-way dry, and apply a gentle, unscented moisturizer cream or ointment (Cerave, Cetaphil, Eucerin, Aveeno)  all over while still damp. Dry skin makes the itching worse. The skin should be moisturized with a gentle, unscented moisturizer at least twice daily.  - Use only unscented liquid laundry detergent. - You can also try Sarna anti-itch lotion as needed.   - Use Xyzal 5mg  up to twice daily.  This replaces Zyrtec.   Idiopathic Angioedema (Swelling) - Keep track of episodes and take pictures of when it is swollen vs normal.  - At this time etiology of swelling is unknown. Swelling can be caused by a variety of different triggers including illness/infection, exercise, pressure, vibrations, extremes of temperature to name a few however majority of the time there is no identifiable trigger.  -Continue Xyzal 5mg  daily.  If symptoms worsen, increase to Xyzal 5mg  twice daily and Pepcid 20mg  twice daily. Stop Zyrtec.   Tongue Burning - Consider switching toothpaste.   Chronic Rhinitis: - Positive skin test 08/2022: none - Use nasal saline rinses before nose sprays such as with Neilmed Sinus Rinse.  Use distilled water.   - Use Flonase 2 sprays each nostril daily. Aim upward and outward. - Use Azelastine 1-2 sprays each nostril twice daily as needed for runny nose, congestion, drainage, sneezing. Aim upward and outward. - Use Xyzal 5 mg daily

## 2023-09-30 ENCOUNTER — Telehealth: Payer: Self-pay

## 2023-09-30 ENCOUNTER — Other Ambulatory Visit: Payer: Self-pay | Admitting: Internal Medicine

## 2023-09-30 MED ORDER — LEVOCETIRIZINE DIHYDROCHLORIDE 5 MG PO TABS: 5.0000 mg | ORAL_TABLET | Freq: Two times a day (BID) | ORAL | 5 refills | Status: DC | PRN

## 2023-09-30 NOTE — Telephone Encounter (Signed)
*  Asthma/Allergy  Pharmacy Patient Advocate Encounter   Received notification from CoverMyMeds that prior authorization for Levocetirizine Dihydrochloride 5MG  tablets  is required/requested.   Insurance verification completed.   The patient is insured through [facility] .   Per test claim: PA required; PA submitted to above mentioned insurance via CoverMyMeds Key/confirmation #/EOC [id number] Status is pending

## 2023-09-30 NOTE — Telephone Encounter (Signed)
 I called patient and informed of message.

## 2023-09-30 NOTE — Telephone Encounter (Signed)
 Plan limitations are 1 tablet per day for Levocetirizine.

## 2023-09-30 NOTE — Progress Notes (Signed)
 Insurance only covers 30 tablets of Xyzal.  Will change Rx.

## 2023-10-01 ENCOUNTER — Telehealth: Payer: Self-pay | Admitting: Internal Medicine

## 2023-10-01 ENCOUNTER — Encounter: Payer: Self-pay | Admitting: Internal Medicine

## 2023-10-01 ENCOUNTER — Other Ambulatory Visit (HOSPITAL_COMMUNITY): Payer: Self-pay

## 2023-10-01 MED ORDER — NURTEC 75 MG PO TBDP: ORAL_TABLET | ORAL | 11 refills | Status: AC

## 2023-10-01 NOTE — Telephone Encounter (Signed)
 Tammy called from [facility] about medication levocetirizine levocetirizine (XYZAL) 5 MG tablet    concerning directions on medication and requested a call back [phone].

## 2023-10-01 NOTE — Telephone Encounter (Signed)
 Pharmacy Patient Advocate Encounter  Received notification from [facility] that Prior Authorization for AJOVY (fremanezumab-vfrm) injection 225MG /1.5ML auto-injectors has been DENIED.  Full denial letter will be uploaded to the media tab. See denial reason below.   PA #/Case ID/Reference #: [id number]  I looked back at the [facility] that was submitted and Emgality was not checked off like it should have been so I went ahead and sent an appeal letter to [facility].

## 2023-10-01 NOTE — Telephone Encounter (Signed)
 Appeal has been submitted. Will advise when response is received or follow up in 1 week. Please be advised that most companies may take 30 days to make a decision.  Faxed appeal letter along with denial letter and clinicals to [employer] as an urgent expedited request to fax number [phone]

## 2023-10-01 NOTE — Telephone Encounter (Signed)
 Pharmacy Patient Advocate Encounter  Received notification from [facility] that Prior Authorization for Nurtec 75MG  dispersible tablets has been APPROVED from 09/29/2023 to 06/23/2024. Ran test claim, Copay is $0 for 30DS which is 8 tablets.. This test claim was processed through [facility]- copay amounts may vary at other pharmacies due to pharmacy/plan contracts, or as the patient moves through the different stages of their insurance plan.   PA #/Case ID/Reference #: PA Case ID #: [id number]

## 2023-10-01 NOTE — Addendum Note (Signed)
 Addended by: Aura Camps on: 10/01/2023 04:05 PM   Modules accepted: Orders

## 2023-10-01 NOTE — Telephone Encounter (Signed)
 Pt said went to pharmacy to pick up Nurtec. Pharmacist said the prescription was written wrong. Could you resend to the pharmacy.

## 2023-10-01 NOTE — Telephone Encounter (Signed)
 I called [facility] and they have Rx for Nurtec ( Rx is not on pt medication, I will put back on medication list and have Rx put for phone in so it will not be sent to the pharmacy)    I informed pharmacy that PA has been approved for Nurtec and we are waiting for appeal for Ajovy as of 10/01/23.

## 2023-10-01 NOTE — Telephone Encounter (Signed)
Pt aware of approval of Nurtec

## 2023-10-02 NOTE — Telephone Encounter (Signed)
 Called and spoke to pharmacist and read what patients AVS stated and verbally discussed with provider and confirmed that patients can take Xyzal twice a day.

## 2023-10-09 ENCOUNTER — Other Ambulatory Visit: Payer: Self-pay | Admitting: *Deleted

## 2023-10-09 ENCOUNTER — Other Ambulatory Visit: Payer: Self-pay | Admitting: Gastroenterology

## 2023-10-09 DIAGNOSIS — K219 Gastro-esophageal reflux disease without esophagitis: Secondary | ICD-10-CM

## 2023-10-09 MED ORDER — AJOVY 225 MG/1.5ML ~~LOC~~ SOAJ: 225.0000 mg | SUBCUTANEOUS | 3 refills | Status: DC

## 2023-10-09 NOTE — Telephone Encounter (Signed)
 Called pt. Relayed we had not received notification yet about approval and glad she called. Appears rx previously sent but d/c'd by another office stating she was not taking yet. Relayed I called in new prescription for her. She verbalized understanding.

## 2023-10-09 NOTE — Telephone Encounter (Signed)
 Pt reports that the day before yesterday she received a letter from her insurance company that the Ajovy was approved, pt is asking when will the Rx be called into her pharmacy.

## 2023-10-13 ENCOUNTER — Other Ambulatory Visit: Payer: Self-pay | Admitting: Student

## 2023-10-18 NOTE — H&P (View-Only) (Signed)
 Referring Provider: Omie Bickers, MD Primary Care Physician:  Donna Bickers, MD Primary GI Physician: [name]  Chief Complaint  Patient presents with   Abdominal Pain    After she eats her stomach swells     HPI:   [name] is a 78 y.o. female with history of  likely opioid induced constipation, adenomatous colon polyps, GERD, esophageal dysphagia with Schatzki's ring dilated in April 2021 and 2023, oropharyngeal dysphagia with recommendations for D2/chopped diet, esophageal granular cell tumor s/p EMR in 2011 by Dr. Rowena Houston. She also saw Dr. Rowena Houston for uncontrolled GERD in 2014 s/p EGDs, normal GES, and a variety of PPIs. Manometry had been attempted but she was unable to tolerate the probe. She is presenting today for follow-up with chief complaint of upper abdominal pain.  Last seen in the office 04/21/2023.  Constipation doing well since she started drinking protein shakes.  No BRBPR, melena.  Noted intermittent abdominal pain that was random in the mid left abdomen lasting about an hour and resolving spontaneously.  No GERD symptoms.  Dysphagia symptoms stable.  Good appetite.  She was continued on pantoprazole  40 mg twice daily, famotidine  20 mg at bedtime, and advised follow-up in 6 months.  Today: Worsening dysphagia symptoms for the last couple of months.  Increased difficulty with getting the foods to the back of her mouth or swallowing.  When she swallows, it does feel like the food passes normally, but she develops what feels like swelling in her upper abdomen with sensation of a tight band across her abdomen.  This occurs with everything that she eats.  Last for couple hours at a time.  History of cholecystectomy.  Denies any similar symptoms in the past.   She also has postprandial nausea and early satiety.  Chronic GERD is pretty well controlled. Taking pantoprazole  40 mg Bid and famotidine  at bedtime.    Started Nurtec and Ajovy  recently. Has been taking 400 mg  ibuprofen daily for the last 2 months.  No ETOH.    Constipation: No real issues at this time.     Last EGD 08/23/2021: White speckled mucosa in the esophagus, medium sized hiatal hernia, grade a reflux esophagitis, mild Schatzki's ring dilated.  KOH prep was negative.  Last colonoscopy 07/23/2018: Diverticulosis in the sigmoid and descending colon, 5 mm polyp in hepatic flexure resected and retrieved, nonbleeding internal hemorrhoids.  Pathology with tubular adenoma.  No recommendations to repeat due to age.    Past Medical History:  Diagnosis Date   Adenomatous colon polyp 2006   excised in 2006 & 2010Due surveillance 06/2013   Anxiety    Anxiety and depression    Asthma    Breast mass    right nipple bengn mass per patient   Breast tumor    Chest discomfort    Chronic back pain    Chronic constipation    Collagen vascular disease (HCC)    Complication of anesthesia    Depression    Diverticulosis    Fibromyalgia    Gastroesophageal reflux disease    Hiatal hernia    History of benign esophageal tumor 2010   granular cell esophageal tumor (Dx 06/2008), resected via EMR 2011, due repeat EGD 02/2012   Hyperlipidemia    Hypertension    Insomnia    Migraines    PONV (postoperative nausea and vomiting)    Psychosis (HCC)    Renal insufficiency    Sleep apnea    Stop  Bang score of 5. Pt said she was told by Dr. Joleen Houston that she had "a little bit" of sleep apena, but not bad enough to treat.   Thyroid  nodule    Tumor of esophagus     Past Surgical History:  Procedure Laterality Date   ABDOMINAL HYSTERECTOMY     BALLOON DILATION  08/23/2021   Procedure: BALLOON DILATION;  Surgeon: Donna Greening, DO;  [facility]: [facility];  Service: Endoscopy;;   BIOPSY  10/14/2019   Procedure: BIOPSY;  Surgeon: Donna Espy, MD;  [facility]: [facility];  Service: Endoscopy;;   BRAVO PH STUDY  12/11/2011   Procedure: BRAVO PH STUDY;  Surgeon: Donna Espy, MD;  Location:  [facility];  Service: Endoscopy;  Laterality: N/A;   BREAST EXCISIONAL BIOPSY  1990s, 2012   Left x2-sclerosing ductal papilloma-2012   CHOLECYSTECTOMY N/A 04/09/2013   Procedure: LAPAROSCOPIC CHOLECYSTECTOMY;  Surgeon: Donna Bound, MD;  Location: [facility];  Service: General;  Laterality: N/A;   COLONOSCOPY  06/2008, 06/2011   sigmoid tics, tubular adenoma; 2013: anal canal hemorrhoids   COLONOSCOPY  07/10/2011   Anal canal hemorrhoids likely the cause of hematochezia in the setting of constipation; otherwise normal rectum ;submucosal  petechiae in left colon of doubtful clinical significance; otherwise, normal colon   COLONOSCOPY WITH PROPOFOL  N/A 11/10/2017   cancelled in pre-op   COLONOSCOPY WITH PROPOFOL  N/A 07/23/2018   Procedure: COLONOSCOPY WITH PROPOFOL ;  Surgeon: Donna Espy, MD; 1 tubular adenoma, diverticulosis in the sigmoid and descending colon, nonbleeding internal hemorrhoids.  No recommendations to repeat due to age.   ESOPHAGEAL BRUSHING  08/23/2021   Procedure: ESOPHAGEAL BRUSHING;  Surgeon: Donna Greening, DO;  [facility];  Service: Endoscopy;;   ESOPHAGEAL DILATION N/A 05/08/2015   Procedure: ESOPHAGEAL DILATION;  Surgeon: Donna Espy, MD;  Location: [facility];  Service: Endoscopy;  Laterality: N/ALonda Houston 54/56   ESOPHAGOGASTRODUODENOSCOPY  12/11/2011   DGU:[id number] Schatzki's ring; otherwise normal/Small hiatal hernia. Antral and bulbar erosions   ESOPHAGOGASTRODUODENOSCOPY (EGD) WITH PROPOFOL  N/A 05/08/2015   [name]: mild erosive reflux esophagitis, non-critical Schatzki's ring s/p dilation. Hiatal hernia.    ESOPHAGOGASTRODUODENOSCOPY (EGD) WITH PROPOFOL  N/A 10/14/2019   Procedure: ESOPHAGOGASTRODUODENOSCOPY (EGD) WITH PROPOFOL ;  Surgeon: Donna Espy, MD;  Nonobstructing Schatzki ring, small hiatal hernia, abnormal gastric mucosa s/p biopsied, normal first and second portion of the duodenum.  Pathology with chronic inactive gastritis, no  H. pylori.   ESOPHAGOGASTRODUODENOSCOPY (EGD) WITH PROPOFOL  N/A 08/23/2021   Surgeon: Donna Greening, DO;   white speckled mucosa in the esophagus s/p cells obtained for cytology (KOH prep negative), medium-sized hiatal hernia, grade A reflux esophagitis, mild Schatzki's ring s/p dilation, normal examined stomach and duodenum.   EUS  08/2010   Dr [facility] with EGD. Retained food. No recurrent esophageal lesion, bx negative.   IR KYPHO THORACIC WITH BONE BIOPSY  10/17/2020   IR RADIOLOGIST EVAL & MGMT  09/12/2020   LUNG BIOPSY     negative   PARATHYROIDECTOMY  09/2015   [facility].    PARATHYROIDECTOMY     POLYPECTOMY  07/23/2018   Procedure: POLYPECTOMY;  Surgeon: Donna Espy, MD;  [facility];  Service: Endoscopy;;  colon   RIGHT OOPHORECTOMY     benign disease   SHOULDER ARTHROSCOPY     Right; bone spurs removed   TONSILLECTOMY     TOTAL KNEE ARTHROPLASTY  2002   Right; previous arthroscopic surgery  Current Outpatient Medications  Medication Sig Dispense Refill   albuterol  (PROVENTIL  HFA;VENTOLIN  HFA) 108 (90 BASE) MCG/ACT inhaler Inhale 2 puffs into the lungs every 6 (six) hours as needed for wheezing or shortness of breath.     amLODipine  (NORVASC ) 10 MG tablet Take 10 mg by mouth daily.     azelastine  (ASTELIN ) 0.1 % nasal spray 1-2 sprays each nostril twice daily as needed 30 mL 5   Azelastine -Fluticasone  137-50 MCG/ACT SUSP      BREZTRI AEROSPHERE 160-9-4.8 MCG/ACT AERO Inhale into the lungs.     Calcium Carbonate-Vit D-Min (CALCIUM 1200 PO) Take by mouth daily.     carvedilol  (COREG ) 3.125 MG tablet TAKE ONE TABLET (3.125MG  TOTAL) BY MOUTHTWO TIMES DAILY WITH A MEAL 60 tablet 6   Cyanocobalamin (VITAMIN B12 PO) Take by mouth.     diphenhydrAMINE HCl, Sleep, (ZZZQUIL PO) Take by mouth.     doxepin (SINEQUAN) 25 MG capsule Take by mouth.     famotidine  (PEPCID ) 20 MG tablet TAKE TWO TABLETS (40MG  TOTAL) BY MOUTH AT BEDTIME     fluticasone  (FLONASE ) 50  MCG/ACT nasal spray Place 2 sprays into both nostrils daily. 48 g 1   fluticasone  0.05%-ketoconazole 2% 1:1 cream mixture Apply topically.     Fremanezumab -vfrm (AJOVY ) 225 MG/1.5ML SOAJ Inject 225 mg into the skin every 30 (thirty) days. 4.5 mL 3   furosemide  (LASIX ) 40 MG tablet TAKE ONE TABLET (40MG  TOTAL) BY MOUTH DAILY 90 tablet 1   hydrALAZINE  (APRESOLINE ) 100 MG tablet TAKE ONE TABLET (100MG  TOTAL) BY MOUTH THREE TIMES DAILY 90 tablet 0   ketoconazole (NIZORAL) 2 % cream Apply topically 2 (two) times daily as needed.     levocetirizine (XYZAL ) 5 MG tablet Take 1 tablet (5 mg total) by mouth 2 (two) times daily as needed for allergies (or itching). 30 tablet 5   levothyroxine (SYNTHROID) 25 MCG tablet Take 25 mcg by mouth daily.     lovastatin  (MEVACOR ) 40 MG tablet Take 40 mg by mouth at bedtime.     Melatonin 3 MG CAPS Take by mouth.     Multiple Vitamins-Minerals (CENTRUM SILVER 50+WOMEN PO) Take by mouth daily.     pantoprazole  (PROTONIX ) 40 MG tablet TAKE ONE TABLET (40MG  TOTAL) BY MOUTH TWO TIMES DAILY 60 tablet 5   potassium chloride  SA (KLOR-CON  M) 20 MEQ tablet Take 1 tablet (20 mEq total) by mouth daily. 30 tablet 6   RESTASIS 0.05 % ophthalmic emulsion 1 drop 2 (two) times daily.     Rimegepant Sulfate (NURTEC) 75 MG TBDP Take 1 tablet (75 mg total) by mouth daily as needed (take for abortive therapy of migraine, no more than 1 tablet in 24 hours or 10 per month). 8 tablet 11   triamcinolone cream (KENALOG) 0.1 % Apply topically 2 (two) times daily.     clobetasol cream (TEMOVATE) 0.05 % Apply 1 Application topically 2 (two) times daily.     No current facility-administered medications for this visit.    Allergies as of 10/20/2023 - Review Complete 10/20/2023  Allergen Reaction Noted   Elavil  [amitriptyline ] Other (See Comments) 06/03/2012   Abilify [aripiprazole] Other (See Comments) 11/25/2012   Codeine Hives, Nausea Only, and Other (See Comments)    Latex Hives  11/21/2011   Losartan   09/17/2022   Penicillins Hives, Itching, and Other (See Comments)    Polyethylene glycol Other (See Comments) 12/26/2012   Spironolactone   05/14/2022   Sulfonamide derivatives Nausea And Vomiting 11/25/2022   Cymbalta  [duloxetine   hcl] Rash 03/10/2013   Remeron [mirtazapine] Other (See Comments) 05/06/2012    Family History  Problem Relation Age of Onset   Heart attack Mother    Depression Mother    Anxiety disorder Mother    Migraines Mother    Stroke Father    Alcohol abuse Father    Migraines Father    Dementia Maternal Uncle    Colon cancer Paternal Aunt    Colon cancer Paternal Uncle    ADD / ADHD Neg Hx    Bipolar disorder Neg Hx    Drug abuse Neg Hx    OCD Neg Hx    Paranoid behavior Neg Hx    Schizophrenia Neg Hx    Seizures Neg Hx    Sexual abuse Neg Hx    Physical abuse Neg Hx     Social History   Socioeconomic History   Marital status: Single    Spouse name: Not on file   Number of children: 1   Years of education: Not on file   Highest education level: Not on file  Occupational History   Occupation: disabled    Employer: RETIRED  Tobacco Use   Smoking status: Never   Smokeless tobacco: Never  Vaping Use   Vaping status: Never Used  Substance and Sexual Activity   Alcohol use: No    Alcohol/week: 0.0 standard drinks of alcohol   Drug use: No   Sexual activity: Never  Other Topics Concern   Not on file  Social History Narrative   Pt lives with daughter    Retired    [employer] investment banker Strain: Not on file  Food Insecurity: Not on file  Transportation Needs: Not on file  Physical Activity: Not on file  Stress: Not on file  Social Connections: Not on file    Review of Systems: Gen: Denies fever, chills, flulike symptoms, presyncope, syncope. CV: Denies chest pain, palpitations. Resp: Denies dyspnea, cough. GI: See HPI Heme: See HPI  Physical Exam: BP 136/84 (BP Location: Left Arm,  Patient Position: Sitting, Cuff Size: Large)   Pulse 65   Temp 97.6 F (36.4 C) (Temporal)   Ht 5\' 2"  (1.575 m)   Wt 195 lb 6.4 oz (88.6 kg)   BMI 35.74 kg/m  General:   Alert and oriented. No distress noted. Pleasant and cooperative.  Head:  Normocephalic and atraumatic. Eyes:  Conjuctiva clear without scleral icterus. Heart:  S1, S2 present without murmurs appreciated. Lungs:  Clear to auscultation bilaterally. No wheezes, rales, or rhonchi. No distress.  Abdomen:  +BS, soft, non-tender and non-distended. No rebound or guarding. No HSM or masses noted. Msk:  Symmetrical without gross deformities. Normal posture. Extremities:  Without edema. Neurologic:  Alert and  oriented x4 Psych:  Normal mood and affect.    Assessment:  78 y.o. female with history of  likely opioid induced constipation, adenomatous colon polyps, GERD, esophageal dysphagia with Schatzki's ring dilated in April 2021 and 2023, oropharyngeal dysphagia with recommendations for D2/chopped diet, esophageal granular cell tumor s/p EMR in 2011 by Dr. Rowena Houston. She also saw Dr. Rowena Houston for uncontrolled GERD in 2014 s/p EGDs, normal GES, and a variety of PPIs. Manometry had been attempted but she was unable to tolerate the probe. She is presenting today for follow-up with chief complaint of upper abdominal pain.  Upper abdominal pain: New onset postprandial upper abdominal pain described as a swelling sensation with tight band around her upper abdomen.  No specific  food triggers, occurring with everything that she eats.  Associate nausea without vomiting and early satiety.  She has been taking ibuprofen 400 mg daily for the last 2 months which does put her at increased risk for gastritis, duodenitis, PUD.  Could also have H. pylori, gastroparesis, gastric outlet obstruction.  Needs EGD for further evaluation. Notably, she has history of cholecystectomy.   Dysphagia: Chronic.  Multifactorial in the setting of oropharyngeal  dysphagia which is the primary etiology of her chronic symptoms.  She does have history of Schatzki's ring that was dilated previously.  Denies any current sensation of food getting stuck in her esophagus though she is having postprandial upper abdominal pain.  As we are pursuing an upper endoscopy for abdominal pain, will add possible esophageal dilation as appropriate.  Could be that foods are getting stuck in the lower esophagus causing radiating pain to the upper abdomen.   Plan:  CBC, CMP, Lipase.  Proceed with upper endoscopy +/- dilation with propofol  by [name] in near future. The risks, benefits, and alternatives have been discussed with the patient in detail. The patient states understanding and desires to proceed.  ASA 3 Avoid ibuprofen and all NSAIDs. Continue pantoprazole  40 mg twice daily Continue famotidine  40 mg at bedtime Follow-up after EGD.   Shana Daring, PA-C [facility] 10/20/2023

## 2023-10-18 NOTE — Progress Notes (Unsigned)
 Referring Provider: Omie Bickers, MD Primary Care Physician:  Donna Bickers, MD Primary GI Physician: Donna. Riley Houston  No chief complaint on file.   HPI:   [name] is a 78 y.o. female with history of  likely opioid induced constipation, adenomatous colon polyps, GERD, esophageal dysphagia with Schatzki's ring dilated in April 2021 and 2023, oropharyngeal dysphagia with recommendations for D2/chopped diet, esophageal granular cell tumor s/p EMR in 2011 by Donna. Rowena Houston. She also saw Donna. Rowena Houston for uncontrolled GERD in 2014 s/p EGDs, normal GES, and a variety of PPIs. Manometry had been attempted but she was unable to tolerate the probe. She is presenting today for ***  Last seen in the office 04/21/2023.  Constipation doing well since she started drinking protein shakes.  No BRBPR, melena.  Noted intermittent abdominal pain that was random in the mid left abdomen lasting about an hour and resolving spontaneously.  No GERD symptoms.  Dysphagia symptoms stable.  Good appetite.  She was continued on pantoprazole  40 mg twice daily, famotidine  20 mg at bedtime, and advised follow-up in 6 months.  Today: Constipation:  GERD:   Dysphagia:  Past Medical History:  Diagnosis Date   Adenomatous colon polyp 2006   excised in 2006 & 2010Due surveillance 06/2013   Anxiety    Anxiety and depression    Asthma    Breast mass    right nipple bengn mass per patient   Breast tumor    Chest discomfort    Chronic back pain    Chronic constipation    Collagen vascular disease (HCC)    Complication of anesthesia    Depression    Diverticulosis    Fibromyalgia    Gastroesophageal reflux disease    Hiatal hernia    History of benign esophageal tumor 2010   granular cell esophageal tumor (Dx 06/2008), resected via EMR 2011, due repeat EGD 02/2012   Hyperlipidemia    Hypertension    Insomnia    Migraines    PONV (postoperative nausea and vomiting)    Psychosis (HCC)    Renal insufficiency     Sleep apnea    Stop Bang score of 5. Pt said she was told by Donna. Joleen Houston that she had "a little bit" of sleep apena, but not bad enough to treat.   Thyroid  nodule    Tumor of esophagus     Past Surgical History:  Procedure Laterality Date   ABDOMINAL HYSTERECTOMY     BALLOON DILATION  08/23/2021   Procedure: BALLOON DILATION;  Surgeon: Donna Greening, DO;  [facility];  Service: Endoscopy;;   BIOPSY  10/14/2019   Procedure: BIOPSY;  Surgeon: Donna Espy, MD;  [facility];  Service: Endoscopy;;   BRAVO PH STUDY  12/11/2011   Procedure: BRAVO PH STUDY;  Surgeon: Donna Espy, MD;  [facility];  Service: Endoscopy;  Laterality: N/A;   BREAST EXCISIONAL BIOPSY  1990s, 2012   Left x2-sclerosing ductal papilloma-2012   CHOLECYSTECTOMY N/A 04/09/2013   Procedure: LAPAROSCOPIC CHOLECYSTECTOMY;  Surgeon: Donna Bound, MD;  Location: [facility];  Service: General;  Laterality: N/A;   COLONOSCOPY  06/2008, 06/2011   sigmoid tics, tubular adenoma; 2013: anal canal hemorrhoids   COLONOSCOPY  07/10/2011   Anal canal hemorrhoids likely the cause of hematochezia in the setting of constipation; otherwise normal rectum ;submucosal  petechiae in left colon of doubtful clinical significance; otherwise, normal colon   COLONOSCOPY WITH  PROPOFOL  N/A 11/10/2017   cancelled in pre-op   COLONOSCOPY WITH PROPOFOL  N/A 07/23/2018   Procedure: COLONOSCOPY WITH PROPOFOL ;  Surgeon: Donna Espy, MD; 1 tubular adenoma, diverticulosis in the sigmoid and descending colon, nonbleeding internal hemorrhoids.  No recommendations to repeat due to age.   ESOPHAGEAL BRUSHING  08/23/2021   Procedure: ESOPHAGEAL BRUSHING;  Surgeon: Donna Greening, DO;  [facility];  Service: Endoscopy;;   ESOPHAGEAL DILATION N/A 05/08/2015   Procedure: ESOPHAGEAL DILATION;  Surgeon: Donna Espy, MD;  Location: [facility];  Service: Endoscopy;  Laterality: N/ALonda Houston 54/56    ESOPHAGOGASTRODUODENOSCOPY  12/11/2011   BJY:[id number] Schatzki's ring; otherwise normal/Small hiatal hernia. Antral and bulbar erosions   ESOPHAGOGASTRODUODENOSCOPY (EGD) WITH PROPOFOL  N/A 05/08/2015   Donna. Riley Houston: mild erosive reflux esophagitis, non-critical Schatzki's ring s/p dilation. Hiatal hernia.    ESOPHAGOGASTRODUODENOSCOPY (EGD) WITH PROPOFOL  N/A 10/14/2019   Procedure: ESOPHAGOGASTRODUODENOSCOPY (EGD) WITH PROPOFOL ;  Surgeon: Donna Espy, MD;  Nonobstructing Schatzki ring, small hiatal hernia, abnormal gastric mucosa s/p biopsied, normal first and second portion of the duodenum.  Pathology with chronic inactive gastritis, no H. pylori.   ESOPHAGOGASTRODUODENOSCOPY (EGD) WITH PROPOFOL  N/A 08/23/2021   Surgeon: Donna Greening, DO;   white speckled mucosa in the esophagus s/p cells obtained for cytology (KOH prep negative), medium-sized hiatal hernia, grade A reflux esophagitis, mild Schatzki's ring s/p dilation, normal examined stomach and duodenum.   EUS  08/2010   [name] with EGD. Retained food. No recurrent esophageal lesion, bx negative.   IR KYPHO THORACIC WITH BONE BIOPSY  10/17/2020   IR RADIOLOGIST EVAL & MGMT  09/12/2020   LUNG BIOPSY     negative   PARATHYROIDECTOMY  09/2015   [facility].    PARATHYROIDECTOMY     POLYPECTOMY  07/23/2018   Procedure: POLYPECTOMY;  Surgeon: Donna Espy, MD;  [facility];  Service: Endoscopy;;  colon   RIGHT OOPHORECTOMY     benign disease   SHOULDER ARTHROSCOPY     Right; bone spurs removed   TONSILLECTOMY     TOTAL KNEE ARTHROPLASTY  2002   Right; previous arthroscopic surgery    Current Outpatient Medications  Medication Sig Dispense Refill   albuterol  (PROVENTIL  HFA;VENTOLIN  HFA) 108 (90 BASE) MCG/ACT inhaler Inhale 2 puffs into the lungs every 6 (six) hours as needed for wheezing or shortness of breath.  (Patient not taking: Reported on 09/29/2023)     amLODipine  (NORVASC ) 10 MG tablet Take 10 mg by mouth  daily.     azelastine  (ASTELIN ) 0.1 % nasal spray 1-2 sprays each nostril twice daily as needed 30 mL 5   Azelastine -Fluticasone  137-50 MCG/ACT SUSP      BREZTRI AEROSPHERE 160-9-4.8 MCG/ACT AERO Inhale into the lungs.     Calcium Carbonate-Vit D-Min (CALCIUM 1200 PO) Take by mouth daily.     carvedilol  (COREG ) 3.125 MG tablet TAKE ONE TABLET (3.125MG  TOTAL) BY MOUTHTWO TIMES DAILY WITH A MEAL 60 tablet 6   clobetasol cream (TEMOVATE) 0.05 % Apply 1 Application topically 2 (two) times daily.     Cyanocobalamin (VITAMIN B12 PO) Take by mouth.     diphenhydrAMINE HCl, Sleep, (ZZZQUIL PO) Take by mouth.     doxepin (SINEQUAN) 25 MG capsule Take by mouth.     famotidine  (PEPCID ) 20 MG tablet TAKE TWO TABLETS (40MG  TOTAL) BY MOUTH AT BEDTIME     Ferrous Sulfate (IRON PO) Take by mouth daily.     fluorometholone (FML) 0.1 %  ophthalmic suspension SMARTSIG:In Eye(s)     fluticasone  (FLONASE ) 50 MCG/ACT nasal spray Place 2 sprays into both nostrils daily. 48 g 1   fluticasone  0.05%-ketoconazole 2% 1:1 cream mixture Apply topically.     Fremanezumab -vfrm (AJOVY ) 225 MG/1.5ML SOAJ Inject 225 mg into the skin every 30 (thirty) days. 4.5 mL 3   furosemide  (LASIX ) 40 MG tablet TAKE ONE TABLET (40MG  TOTAL) BY MOUTH DAILY 90 tablet 1   hydrALAZINE  (APRESOLINE ) 100 MG tablet TAKE ONE TABLET (100MG  TOTAL) BY MOUTH THREE TIMES DAILY 90 tablet 0   ketoconazole (NIZORAL) 2 % cream Apply topically 2 (two) times daily as needed.     levocetirizine (XYZAL ) 5 MG tablet Take 1 tablet (5 mg total) by mouth 2 (two) times daily as needed for allergies (or itching). 30 tablet 5   levothyroxine (SYNTHROID) 25 MCG tablet Take 25 mcg by mouth daily.     lovastatin  (MEVACOR ) 40 MG tablet Take 40 mg by mouth at bedtime.     Melatonin 3 MG CAPS Take by mouth.     Multiple Vitamins-Minerals (CENTRUM SILVER 50+WOMEN PO) Take by mouth daily.     pantoprazole  (PROTONIX ) 40 MG tablet TAKE ONE TABLET (40MG  TOTAL) BY MOUTH TWO TIMES  DAILY 60 tablet 5   potassium chloride  SA (KLOR-CON  M) 20 MEQ tablet Take 1 tablet (20 mEq total) by mouth daily. 30 tablet 6   RESTASIS 0.05 % ophthalmic emulsion 1 drop 2 (two) times daily.     Rimegepant Sulfate (NURTEC) 75 MG TBDP Take 1 tablet (75 mg total) by mouth daily as needed (take for abortive therapy of migraine, no more than 1 tablet in 24 hours or 10 per month). 8 tablet 11   triamcinolone cream (KENALOG) 0.1 % Apply topically 2 (two) times daily.     No current facility-administered medications for this visit.    Allergies as of 10/20/2023 - Review Complete 09/29/2023  Allergen Reaction Noted   Elavil  [amitriptyline ] Other (See Comments) 06/03/2012   Abilify [aripiprazole] Other (See Comments) 11/25/2012   Codeine Hives, Nausea Only, and Other (See Comments)    Latex Hives 11/21/2011   Losartan   09/17/2022   Penicillins Hives, Itching, and Other (See Comments)    Polyethylene glycol Other (See Comments) 12/26/2012   Spironolactone   05/14/2022   Sulfonamide derivatives Nausea And Vomiting 11/25/2022   Cymbalta  [duloxetine  hcl] Rash 03/10/2013   Remeron [mirtazapine] Other (See Comments) 05/06/2012    Family History  Problem Relation Age of Onset   Heart attack Mother    Depression Mother    Anxiety disorder Mother    Migraines Mother    Stroke Father    Alcohol abuse Father    Migraines Father    Dementia Maternal Uncle    Colon cancer Paternal Aunt    Colon cancer Paternal Uncle    ADD / ADHD Neg Hx    Bipolar disorder Neg Hx    Drug abuse Neg Hx    OCD Neg Hx    Paranoid behavior Neg Hx    Schizophrenia Neg Hx    Seizures Neg Hx    Sexual abuse Neg Hx    Physical abuse Neg Hx     Social History   Socioeconomic History   Marital status: Single    Spouse name: Not on file   Number of children: 1   Years of education: Not on file   Highest education level: Not on file  Occupational History   Occupation: disabled    Employer:  RETIRED  Tobacco Use    Smoking status: Never   Smokeless tobacco: Never  Vaping Use   Vaping status: Never Used  Substance and Sexual Activity   Alcohol use: No    Alcohol/week: 0.0 standard drinks of alcohol   Drug use: No   Sexual activity: Never  Other Topics Concern   Not on file  Social History Narrative   Pt lives with daughter    Retired    Chief Executive Officer [employer] investment banker Strain: Not on file  Food Insecurity: Not on file  Transportation Needs: Not on file  Physical Activity: Not on file  Stress: Not on file  Social Connections: Not on file    Review of Systems: Gen: Denies fever, chills, anorexia. Denies fatigue, weakness, weight loss.  CV: Denies chest pain, palpitations, syncope, peripheral edema, and claudication. Resp: Denies dyspnea at rest, cough, wheezing, coughing up blood, and pleurisy. GI: Denies vomiting blood, jaundice, and fecal incontinence.   Denies dysphagia or odynophagia. Derm: Denies rash, itching, dry skin Psych: Denies depression, anxiety, memory loss, confusion. No homicidal or suicidal ideation.  Heme: Denies bruising, bleeding, and enlarged lymph nodes.  Physical Exam: There were no vitals taken for this visit. General:   Alert and oriented. No distress noted. Pleasant and cooperative.  Head:  Normocephalic and atraumatic. Eyes:  Conjuctiva clear without scleral icterus. Heart:  S1, S2 present without murmurs appreciated. Lungs:  Clear to auscultation bilaterally. No wheezes, rales, or rhonchi. No distress.  Abdomen:  +BS, soft, non-tender and non-distended. No rebound or guarding. No HSM or masses noted. Msk:  Symmetrical without gross deformities. Normal posture. Extremities:  Without edema. Neurologic:  Alert and  oriented x4 Psych:  Normal mood and affect.    Assessment:     Plan:  ***   Shana Daring, PA-C [facility] 10/20/2023

## 2023-10-20 ENCOUNTER — Telehealth: Payer: Self-pay | Admitting: *Deleted

## 2023-10-20 ENCOUNTER — Ambulatory Visit (INDEPENDENT_AMBULATORY_CARE_PROVIDER_SITE_OTHER): Payer: 59 | Admitting: Gastroenterology

## 2023-10-20 ENCOUNTER — Encounter: Payer: Self-pay | Admitting: Gastroenterology

## 2023-10-20 VITALS — BP 136/84 | HR 65 | Temp 97.6°F | Ht 62.0 in | Wt 195.4 lb

## 2023-10-20 DIAGNOSIS — K219 Gastro-esophageal reflux disease without esophagitis: Secondary | ICD-10-CM

## 2023-10-20 DIAGNOSIS — R1010 Upper abdominal pain, unspecified: Secondary | ICD-10-CM | POA: Diagnosis not present

## 2023-10-20 DIAGNOSIS — R1310 Dysphagia, unspecified: Secondary | ICD-10-CM

## 2023-10-20 DIAGNOSIS — R110 Nausea: Secondary | ICD-10-CM

## 2023-10-20 DIAGNOSIS — R6881 Early satiety: Secondary | ICD-10-CM | POA: Diagnosis not present

## 2023-10-20 NOTE — Telephone Encounter (Signed)
 LMOVM to return call  EGD+/-ED w/Dr. Riley Cheadle, asa 3

## 2023-10-20 NOTE — Patient Instructions (Signed)
 Please have labs completed at [facility].   We ill get you scheduled for an upper endoscopy with possible stretching of your esophagus (if needed) with Dr. Riley Cheadle in the near future.  Continue pantoprazole  40 mg twice daily, 30 minutes before breakfast and dinner and famotidine  40 mg at bedtime.  I will plan to see you back in the office after your endoscopy.  Shana Daring, PA-C [facility]

## 2023-10-21 LAB — CBC WITH DIFFERENTIAL/PLATELET
Basophils Absolute: 0.1 10*3/uL (ref 0.0–0.2)
Basos: 1 %
EOS (ABSOLUTE): 0.1 10*3/uL (ref 0.0–0.4)
Eos: 1 %
Hematocrit: 39.2 % (ref 34.0–46.6)
Hemoglobin: 12.5 g/dL (ref 11.1–15.9)
Immature Grans (Abs): 0 10*3/uL (ref 0.0–0.1)
Immature Granulocytes: 0 %
Lymphocytes Absolute: 1.4 10*3/uL (ref 0.7–3.1)
Lymphs: 20 %
MCH: 25 pg — ABNORMAL LOW (ref 26.6–33.0)
MCHC: 31.9 g/dL (ref 31.5–35.7)
MCV: 78 fL — ABNORMAL LOW (ref 79–97)
Monocytes Absolute: 0.5 10*3/uL (ref 0.1–0.9)
Monocytes: 7 %
Neutrophils Absolute: 5.1 10*3/uL (ref 1.4–7.0)
Neutrophils: 71 %
Platelets: 229 10*3/uL (ref 150–450)
RBC: 5.01 x10E6/uL (ref 3.77–5.28)
RDW: 15.6 % — ABNORMAL HIGH (ref 11.7–15.4)
WBC: 7.1 10*3/uL (ref 3.4–10.8)

## 2023-10-21 LAB — CMP14+EGFR
ALT: 15 IU/L (ref 0–32)
AST: 17 IU/L (ref 0–40)
Albumin: 4.3 g/dL (ref 3.8–4.8)
Alkaline Phosphatase: 89 IU/L (ref 44–121)
BUN/Creatinine Ratio: 15 (ref 12–28)
BUN: 13 mg/dL (ref 8–27)
Bilirubin Total: 0.2 mg/dL (ref 0.0–1.2)
CO2: 21 mmol/L (ref 20–29)
Calcium: 10 mg/dL (ref 8.7–10.3)
Chloride: 105 mmol/L (ref 96–106)
Creatinine, Ser: 0.89 mg/dL (ref 0.57–1.00)
Globulin, Total: 2.3 g/dL (ref 1.5–4.5)
Glucose: 95 mg/dL (ref 70–99)
Potassium: 3.7 mmol/L (ref 3.5–5.2)
Sodium: 143 mmol/L (ref 134–144)
Total Protein: 6.6 g/dL (ref 6.0–8.5)
eGFR: 67 mL/min/{1.73_m2} (ref >59)

## 2023-10-21 LAB — LIPASE: Lipase: 40 U/L (ref 14–85)

## 2023-10-23 ENCOUNTER — Encounter: Payer: Self-pay | Admitting: *Deleted

## 2023-10-23 NOTE — Telephone Encounter (Signed)
 Pt informed that pre-op appointment is 10/31/23 at 1:15 pm at [facility]. Verbalized understanding

## 2023-10-23 NOTE — Telephone Encounter (Signed)
 Pt has been scheduled for 11/05/23, instructions given to pt and will call pt with pre-op date.

## 2023-10-27 DIAGNOSIS — I10 Essential (primary) hypertension: Secondary | ICD-10-CM | POA: Diagnosis not present

## 2023-10-27 DIAGNOSIS — E039 Hypothyroidism, unspecified: Secondary | ICD-10-CM | POA: Diagnosis not present

## 2023-10-27 DIAGNOSIS — M2550 Pain in unspecified joint: Secondary | ICD-10-CM | POA: Diagnosis not present

## 2023-10-27 DIAGNOSIS — R7303 Prediabetes: Secondary | ICD-10-CM | POA: Diagnosis not present

## 2023-10-30 DIAGNOSIS — H04123 Dry eye syndrome of bilateral lacrimal glands: Secondary | ICD-10-CM | POA: Diagnosis not present

## 2023-10-31 ENCOUNTER — Encounter: Payer: Self-pay | Admitting: Internal Medicine

## 2023-10-31 ENCOUNTER — Encounter (HOSPITAL_COMMUNITY)
Admission: RE | Admit: 2023-10-31 | Discharge: 2023-10-31 | Disposition: A | Discharge status: Home / Self Care | Source: Ambulatory Visit | Attending: Internal Medicine | Admitting: Internal Medicine

## 2023-10-31 DIAGNOSIS — I130 Hypertensive heart and chronic kidney disease with heart failure and stage 1 through stage 4 chronic kidney disease, or unspecified chronic kidney disease: Secondary | ICD-10-CM | POA: Diagnosis not present

## 2023-10-31 DIAGNOSIS — J310 Chronic rhinitis: Secondary | ICD-10-CM | POA: Diagnosis not present

## 2023-10-31 DIAGNOSIS — N1831 Chronic kidney disease, stage 3a: Secondary | ICD-10-CM | POA: Diagnosis not present

## 2023-10-31 DIAGNOSIS — R609 Edema, unspecified: Secondary | ICD-10-CM | POA: Diagnosis not present

## 2023-10-31 DIAGNOSIS — R0609 Other forms of dyspnea: Secondary | ICD-10-CM | POA: Diagnosis not present

## 2023-10-31 DIAGNOSIS — I5032 Chronic diastolic (congestive) heart failure: Secondary | ICD-10-CM | POA: Diagnosis not present

## 2023-10-31 DIAGNOSIS — E039 Hypothyroidism, unspecified: Secondary | ICD-10-CM | POA: Diagnosis not present

## 2023-10-31 DIAGNOSIS — L299 Pruritus, unspecified: Secondary | ICD-10-CM | POA: Diagnosis not present

## 2023-10-31 DIAGNOSIS — E876 Hypokalemia: Secondary | ICD-10-CM | POA: Diagnosis not present

## 2023-11-04 NOTE — Telephone Encounter (Signed)
 Pt has been rescheduled to 11/13/23 at 9:15 am. Instructions went over in detail with pt.

## 2023-11-04 NOTE — Progress Notes (Signed)
 Pt notified procedure canceled for no water  in hospital. Instructed to call Dr Drucilla Georgis office to reschedule. Voiced understanding.

## 2023-11-04 NOTE — Telephone Encounter (Signed)
 Pt informed that procedure for tomorrow will be cancelled due to water  issues. Will call back once we get a date to get her rescheduled.

## 2023-11-10 ENCOUNTER — Other Ambulatory Visit: Payer: Self-pay | Admitting: Cardiology

## 2023-11-11 ENCOUNTER — Other Ambulatory Visit (HOSPITAL_COMMUNITY): Payer: Self-pay

## 2023-11-11 ENCOUNTER — Ambulatory Visit: Attending: Cardiology | Admitting: Cardiology

## 2023-11-11 ENCOUNTER — Other Ambulatory Visit (HOSPITAL_COMMUNITY): Payer: Self-pay | Admitting: Internal Medicine

## 2023-11-11 ENCOUNTER — Encounter: Payer: Self-pay | Admitting: Internal Medicine

## 2023-11-11 ENCOUNTER — Encounter: Payer: Self-pay | Admitting: Cardiology

## 2023-11-11 ENCOUNTER — Telehealth: Payer: Self-pay

## 2023-11-11 VITALS — BP 134/72 | HR 95 | Ht 62.0 in | Wt 192.2 lb

## 2023-11-11 DIAGNOSIS — E782 Mixed hyperlipidemia: Secondary | ICD-10-CM | POA: Diagnosis not present

## 2023-11-11 DIAGNOSIS — I10 Essential (primary) hypertension: Secondary | ICD-10-CM

## 2023-11-11 DIAGNOSIS — I5032 Chronic diastolic (congestive) heart failure: Secondary | ICD-10-CM | POA: Diagnosis not present

## 2023-11-11 DIAGNOSIS — R0609 Other forms of dyspnea: Secondary | ICD-10-CM

## 2023-11-11 NOTE — Telephone Encounter (Signed)
 Called insurance to follow up-APPEAL APPROVED from  11/01/2023 thru 06/16/2024  Call reference: [id number]     PT filled on 11/04/2023.Next available fill is 11/27/2023. Insurance is faxing over approval letter-will upload into the chart under the media tab once received.

## 2023-11-11 NOTE — Patient Instructions (Signed)
 Medication Instructions:  Your physician recommends that you continue on your current medications as directed. Please refer to the Current Medication list given to you today.  *If you need a refill on your cardiac medications before your next appointment, please call your pharmacy*  Lab Work: None If you have labs (blood work) drawn today and your tests are completely normal, you will receive your results only by: MyChart Message (if you have MyChart) OR A paper copy in the mail If you have any lab test that is abnormal or we need to change your treatment, we will call you to review the results.  Testing/Procedures: None  Follow-Up: At [facility], you and your health needs are our priority.  As part of our continuing mission to provide you with exceptional heart care, our providers are all part of one team.  This team includes your primary Cardiologist (physician) and Advanced Practice Providers or APPs (Physician Assistants and Nurse Practitioners) who all work together to provide you with the care you need, when you need it.  Your next appointment:   6 month(s)  Provider:   You may see Dina Rich, MD or one of the following Advanced Practice Providers on your designated Care Team:   Randall An, PA-C  Scotesia Sherrill, [place] Jacolyn Reedy, [place]     We recommend signing up for the patient portal called "MyChart".  Sign up information is provided on this After Visit Summary.  MyChart is used to connect with patients for Virtual Visits (Telemedicine).  Patients are able to view lab/test results, encounter notes, upcoming appointments, etc.  Non-urgent messages can be sent to your provider as well.   To learn more about what you can do with MyChart, go to [email or web address].   Other Instructions

## 2023-11-11 NOTE — Progress Notes (Signed)
 Clinical Summary [name] is a 78 y.o.female seen today for follow up of the following medical problems.    1. HTN - not taking atenolol , has some fatigue she attributes to it -tongue swelling on losartan .  -hyperkalemia on aldaconte - started coreg  3.125mg  bid last visit, no side effects      - 110s-120s/60s-70s - compliant with meds   2. Chronic diastolic heart failure - echo 02/6294 LVEF 60-65%, abnormal diastolic function indeterminate grade.     12/2020 LVEF 60-65%, indet diastolic  08/2022 echo: LVEF 60-65%, no WMAs, indet diastolic fnx, normal RV, PASP 27, mild LAE   - no recent edema. Chronic SOB which is better inhaler.   3.Hyperlipidemia - 03/2021 TC 148 TG 96 HDL 54 LDL 76 - 12/2022 TC 284 TG 52 HDL 64 LDL 83 - 10/2023 TC 132 TG 76 HDL 62 LDL 85      Past Medical History:  Diagnosis Date   Adenomatous colon polyp 2006   excised in 2006 & 2010Due surveillance 06/2013   Anxiety    Anxiety and depression    Asthma    Breast mass    right nipple bengn mass per patient   Breast tumor    Chest discomfort    Chronic back pain    Chronic constipation    Collagen vascular disease (HCC)    Complication of anesthesia    Depression    Diverticulosis    Fibromyalgia    Gastroesophageal reflux disease    Hiatal hernia    History of benign esophageal tumor 2010   granular cell esophageal tumor (Dx 06/2008), resected via EMR 2011, due repeat EGD 02/2012   Hyperlipidemia    Hypertension    Insomnia    Migraines    PONV (postoperative nausea and vomiting)    Psychosis (HCC)    Renal insufficiency    Sleep apnea    Stop Bang score of 5. Pt said she was told by Dr. Joleen Navy that she had "a little bit" of sleep apena, but not bad enough to treat.   Thyroid  nodule    Tumor of esophagus      Allergies  Allergen Reactions   Elavil  [Amitriptyline ] Other (See Comments)    Felt really nervous and felt like something was hold her feet or arm and/ or AM headache  on it and back on it and went away off it.    Abilify [Aripiprazole] Other (See Comments)    Dystonic reaction   Codeine Hives, Nausea Only and Other (See Comments)    Feels funny   Latex Hives   Losartan      Tongue Swelling   Penicillins Hives, Itching and Other (See Comments)    Has patient had a PCN reaction causing immediate rash, facial/tongue/throat swelling, SOB or lightheadedness with hypotension: Yes Has patient had a PCN reaction causing severe rash involving mucus membranes or skin necrosis: No Has patient had a PCN reaction that required hospitalization No Has patient had a PCN reaction occurring within the last 10 years: No If all of the above answers are "NO", then may proceed with Cephalosporin use.    Polyethylene Glycol Other (See Comments)    Per allergy  test   Spironolactone      Hyperkalemia   Sulfonamide Derivatives Nausea And Vomiting   Cymbalta  [Duloxetine  Hcl] Rash   Remeron [Mirtazapine] Other (See Comments)    Caused lots of strange, weird, crazy dreams.     Current Outpatient Medications  Medication Sig  Dispense Refill   albuterol  (PROVENTIL  HFA;VENTOLIN  HFA) 108 (90 BASE) MCG/ACT inhaler Inhale 2 puffs into the lungs every 6 (six) hours as needed for wheezing or shortness of breath.     amLODipine  (NORVASC ) 10 MG tablet Take 10 mg by mouth daily.     azelastine  (ASTELIN ) 0.1 % nasal spray 1-2 sprays each nostril twice daily as needed 30 mL 5   Azelastine -Fluticasone  137-50 MCG/ACT SUSP      BREZTRI AEROSPHERE 160-9-4.8 MCG/ACT AERO Inhale into the lungs.     Calcium Carbonate-Vit D-Min (CALCIUM 1200 PO) Take by mouth daily.     carvedilol  (COREG ) 3.125 MG tablet TAKE ONE TABLET (3.125MG  TOTAL) BY MOUTHTWO TIMES DAILY WITH A MEAL 60 tablet 6   clobetasol cream (TEMOVATE) 0.05 % Apply 1 Application topically 2 (two) times daily.     Cyanocobalamin (VITAMIN B12 PO) Take by mouth.     diphenhydrAMINE HCl, Sleep, (ZZZQUIL PO) Take by mouth.     doxepin  (SINEQUAN) 25 MG capsule Take by mouth.     famotidine  (PEPCID ) 20 MG tablet TAKE TWO TABLETS (40MG  TOTAL) BY MOUTH AT BEDTIME     fluticasone  (FLONASE ) 50 MCG/ACT nasal spray Place 2 sprays into both nostrils daily. 48 g 1   fluticasone  0.05%-ketoconazole 2% 1:1 cream mixture Apply topically.     Fremanezumab -vfrm (AJOVY ) 225 MG/1.5ML SOAJ Inject 225 mg into the skin every 30 (thirty) days. 4.5 mL 3   furosemide  (LASIX ) 40 MG tablet TAKE ONE TABLET (40MG  TOTAL) BY MOUTH DAILY 90 tablet 1   hydrALAZINE  (APRESOLINE ) 100 MG tablet TAKE ONE TABLET (100MG  TOTAL) BY MOUTH THREE TIMES DAILY 90 tablet 0   ketoconazole (NIZORAL) 2 % cream Apply topically 2 (two) times daily as needed.     levocetirizine (XYZAL ) 5 MG tablet Take 1 tablet (5 mg total) by mouth 2 (two) times daily as needed for allergies (or itching). 30 tablet 5   levothyroxine (SYNTHROID) 25 MCG tablet Take 25 mcg by mouth daily.     lovastatin  (MEVACOR ) 40 MG tablet Take 40 mg by mouth at bedtime.     Melatonin 3 MG CAPS Take by mouth.     Multiple Vitamins-Minerals (CENTRUM SILVER 50+WOMEN PO) Take by mouth daily.     pantoprazole  (PROTONIX ) 40 MG tablet TAKE ONE TABLET (40MG  TOTAL) BY MOUTH TWO TIMES DAILY 60 tablet 5   potassium chloride  SA (KLOR-CON  M) 20 MEQ tablet Take 1 tablet (20 mEq total) by mouth daily. 30 tablet 6   RESTASIS 0.05 % ophthalmic emulsion 1 drop 2 (two) times daily.     Rimegepant Sulfate (NURTEC) 75 MG TBDP Take 1 tablet (75 mg total) by mouth daily as needed (take for abortive therapy of migraine, no more than 1 tablet in 24 hours or 10 per month). 8 tablet 11   triamcinolone cream (KENALOG) 0.1 % Apply topically 2 (two) times daily.     No current facility-administered medications for this visit.     Past Surgical History:  Procedure Laterality Date   ABDOMINAL HYSTERECTOMY     BALLOON DILATION  08/23/2021   Procedure: BALLOON DILATION;  Surgeon: Vinetta Greening, DO;  Location: [facility];   Service: Endoscopy;;   BIOPSY  10/14/2019   Procedure: BIOPSY;  Surgeon: Suzette Espy, MD;  Location: [facility];  Service: Endoscopy;;   BRAVO PH STUDY  12/11/2011   Procedure: BRAVO PH STUDY;  Surgeon: Suzette Espy, MD;  Location: [facility];  Service: Endoscopy;  Laterality: N/A;   BREAST EXCISIONAL BIOPSY  1990s, 2012   Left x2-sclerosing ductal papilloma-2012   CHOLECYSTECTOMY N/A 04/09/2013   Procedure: LAPAROSCOPIC CHOLECYSTECTOMY;  Surgeon: Beau Bound, MD;  Location: [facility];  Service: General;  Laterality: N/A;   COLONOSCOPY  06/2008, 06/2011   sigmoid tics, tubular adenoma; 2013: anal canal hemorrhoids   COLONOSCOPY  07/10/2011   Anal canal hemorrhoids likely the cause of hematochezia in the setting of constipation; otherwise normal rectum ;submucosal  petechiae in left colon of doubtful clinical significance; otherwise, normal colon   COLONOSCOPY WITH PROPOFOL  N/A 11/10/2017   cancelled in pre-op   COLONOSCOPY WITH PROPOFOL  N/A 07/23/2018   Procedure: COLONOSCOPY WITH PROPOFOL ;  Surgeon: Suzette Espy, MD; 1 tubular adenoma, diverticulosis in the sigmoid and descending colon, nonbleeding internal hemorrhoids.  No recommendations to repeat due to age.   ESOPHAGEAL BRUSHING  08/23/2021   Procedure: ESOPHAGEAL BRUSHING;  Surgeon: Vinetta Greening, DO;  [facility]: [facility];  Service: Endoscopy;;   ESOPHAGEAL DILATION N/A 05/08/2015   Procedure: ESOPHAGEAL DILATION;  Surgeon: Suzette Espy, MD;  Location: [facility];  Service: Endoscopy;  Laterality: N/ALonda Houston 54/56   ESOPHAGOGASTRODUODENOSCOPY  12/11/2011   QMV:[id number] Schatzki's ring; otherwise normal/Small hiatal hernia. Antral and bulbar erosions   ESOPHAGOGASTRODUODENOSCOPY (EGD) WITH PROPOFOL  N/A 05/08/2015   Dr. Riley Cheadle: mild erosive reflux esophagitis, non-critical Schatzki's ring s/p dilation. Hiatal hernia.    ESOPHAGOGASTRODUODENOSCOPY (EGD) WITH PROPOFOL  N/A 10/14/2019   Procedure:  ESOPHAGOGASTRODUODENOSCOPY (EGD) WITH PROPOFOL ;  Surgeon: Suzette Espy, MD;  Nonobstructing Schatzki ring, small hiatal hernia, abnormal gastric mucosa s/p biopsied, normal first and second portion of the duodenum.  Pathology with chronic inactive gastritis, no H. pylori.   ESOPHAGOGASTRODUODENOSCOPY (EGD) WITH PROPOFOL  N/A 08/23/2021   Surgeon: Vinetta Greening, DO;   white speckled mucosa in the esophagus s/p cells obtained for cytology (KOH prep negative), medium-sized hiatal hernia, grade A reflux esophagitis, mild Schatzki's ring s/p dilation, normal examined stomach and duodenum.   EUS  08/2010   [facility] with [facility]GD. Retained food. No recurrent esophageal lesion, bx negative.   IR KYPHO THORACIC WITH BONE BIOPSY  10/17/2020   IR RADIOLOGIST EVAL & MGMT  09/12/2020   LUNG BIOPSY     negative   PARATHYROIDECTOMY  09/2015   [facility].    PARATHYROIDECTOMY     POLYPECTOMY  07/23/2018   Procedure: POLYPECTOMY;  Surgeon: Suzette Espy, MD;  [facility]: [facility];  Service: Endoscopy;;  colon   RIGHT OOPHORECTOMY     benign disease   SHOULDER ARTHROSCOPY     Right; bone spurs removed   TONSILLECTOMY     TOTAL KNEE ARTHROPLASTY  2002   Right; previous arthroscopic surgery     Allergies  Allergen Reactions   Elavil  [Amitriptyline ] Other (See Comments)    Felt really nervous and felt like something was hold her feet or arm and/ or AM headache on it and back on it and went away off it.    Abilify [Aripiprazole] Other (See Comments)    Dystonic reaction   Codeine Hives, Nausea Only and Other (See Comments)    Feels funny   Latex Hives   Losartan      Tongue Swelling   Penicillins Hives, Itching and Other (See Comments)    Has patient had a PCN reaction causing immediate rash, facial/tongue/throat swelling, SOB or lightheadedness with hypotension: Yes Has patient had a PCN reaction causing severe rash involving mucus membranes or skin necrosis: No  Has patient had a PCN reaction  that required hospitalization No Has patient had a PCN reaction occurring within the last 10 years: No If all of the above answers are "NO", then may proceed with Cephalosporin use.    Polyethylene Glycol Other (See Comments)    Per allergy  test   Spironolactone      Hyperkalemia   Sulfonamide Derivatives Nausea And Vomiting   Cymbalta  [Duloxetine  Hcl] Rash   Remeron [Mirtazapine] Other (See Comments)    Caused lots of strange, weird, crazy dreams.      Family History  Problem Relation Age of Onset   Heart attack Mother    Depression Mother    Anxiety disorder Mother    Migraines Mother    Stroke Father    Alcohol abuse Father    Migraines Father    Dementia Maternal Uncle    Colon cancer Paternal Aunt    Colon cancer Paternal Uncle    ADD / ADHD Neg Hx    Bipolar disorder Neg Hx    Drug abuse Neg Hx    OCD Neg Hx    Paranoid behavior Neg Hx    Schizophrenia Neg Hx    Seizures Neg Hx    Sexual abuse Neg Hx    Physical abuse Neg Hx      Social History [name] reports that she has never smoked. She has never used smokeless tobacco. [name] reports no history of alcohol use.    Physical Examination Today's Vitals   11/11/23 1510  BP: 134/72  Pulse: 95  SpO2: 96%  Weight: 192 lb 3.2 oz (87.2 kg)  Height: 5\' 2"  (1.575 m)   Body mass index is 35.15 kg/m.  Gen: resting comfortably, no acute distress HEENT: no scleral icterus, pupils equal round and reactive, no palptable cervical adenopathy,  CV: RRR, no mrg, no jvd Resp: Clear to auscultation bilaterally GI: abdomen is soft, non-tender, non-distended, normal bowel sounds, no hepatosplenomegaly MSK: extremities are warm, no edema.  Skin: warm, no rash Neuro:  no focal deficits Psych: appropriate affect   Diagnostic Studies  02/17/13 Event monitor: baseline sinus brady in 50s, symptoms of dizziness correlate w/ sinus brady in 50s. Occasional PVCs, 1 PVC couplet.     01/2012 Carotid US : minimal  plaque at bifurcations, no stenosis     10/2014 echo Study Conclusions  - Left ventricle: The cavity size was normal. Wall thickness was   increased in a pattern of mild LVH. Systolic function was normal.   The estimated ejection fraction was in the range of 60% to 65%.   Abnormal diastoilc function, indeterminate grade. - Aortic valve: There was mild regurgitation. Valve area (VTI):   2.48 cm^2. Valve area (Vmax): 2.89 cm^2. - Technically adequate study.     08/2022 echo 1. Left ventricular ejection fraction, by estimation, is 60 to 65%. The  left ventricle has normal function. The left ventricle has no regional  wall motion abnormalities. There is mild concentric left ventricular  hypertrophy. Left ventricular diastolic  parameters are indeterminate.   2. Right ventricular systolic function is normal. The right ventricular  size is normal. There is normal pulmonary artery systolic pressure. The  estimated right ventricular systolic pressure is 26.8 mmHg.   3. Left atrial size was mildly dilated.   4. The mitral valve is grossly normal. Mild mitral valve regurgitation.   5. The aortic valve is tricuspid. There is mild calcification of the  aortic valve. Aortic valve regurgitation is mild.  6. The inferior vena cava is normal in size with greater than 50%  respiratory variability, suggesting right atrial pressure of 3 mmHg.    12/2022 nuclear stress  Findings are consistent with no ischemia. The study is low risk.   No ST deviation was noted. Arrhythmias during stress: frequent PACs, rare PVCs. The ECG was negative for ischemia.   LV perfusion is normal.  No significant myocardial perfusion defects to indicate scar or ischemia.   Left ventricular function is normal. Nuclear stress EF: 66%.     Assessment and Plan    1. HTN - compliant with meds. Some side effects to prior agents as listed above - essentially at goal, her home numbers are well within goal - continue current  meds     2. Chronic HFpEF - euvolemic without symptoms, continue current meds   3. HLD -she is at goal, continue current meds   4. DOE - no significant findings on echo and stress test this year, she is euvolemic today - could be related to asthma, inactivity/sedentary lifestyle. Symptoms improve with inhalers - no plans for repeat cardiac testing at this time.       Laurann Pollock, M.D.

## 2023-11-11 NOTE — Telephone Encounter (Signed)
 Auth Submission: NO AUTH NEEDED Site of care: Site of care: [facility] Payer: [facility] medicare/ dual complete Medication & CPT/J Code(s) submitted: Reclast (Zolendronic acid) [id number] Route of submission (phone, fax, portal): portal Phone # Fax # Auth type: Buy/Bill PB Units/visits requested: 5mg , 1 dose Reference number: [id number] Approval from: 11/11/23 to 06/23/24

## 2023-11-13 ENCOUNTER — Ambulatory Visit (HOSPITAL_COMMUNITY)

## 2023-11-13 ENCOUNTER — Encounter (HOSPITAL_COMMUNITY): Payer: Self-pay | Admitting: Internal Medicine

## 2023-11-13 ENCOUNTER — Encounter (HOSPITAL_COMMUNITY)
Admission: RE | Disposition: A | Discharge status: Home / Self Care | Payer: Self-pay | Source: Home / Self Care | Attending: Internal Medicine

## 2023-11-13 ENCOUNTER — Ambulatory Visit (HOSPITAL_COMMUNITY)
Admission: RE | Admit: 2023-11-13 | Discharge: 2023-11-13 | Disposition: A | Discharge status: Home / Self Care | Attending: Internal Medicine | Admitting: Internal Medicine

## 2023-11-13 ENCOUNTER — Other Ambulatory Visit: Payer: Self-pay

## 2023-11-13 DIAGNOSIS — Z79899 Other long term (current) drug therapy: Secondary | ICD-10-CM | POA: Diagnosis not present

## 2023-11-13 DIAGNOSIS — K2100 Gastro-esophageal reflux disease with esophagitis, without bleeding: Secondary | ICD-10-CM | POA: Diagnosis not present

## 2023-11-13 DIAGNOSIS — K449 Diaphragmatic hernia without obstruction or gangrene: Secondary | ICD-10-CM | POA: Diagnosis not present

## 2023-11-13 DIAGNOSIS — F32A Depression, unspecified: Secondary | ICD-10-CM | POA: Insufficient documentation

## 2023-11-13 DIAGNOSIS — K222 Esophageal obstruction: Secondary | ICD-10-CM | POA: Insufficient documentation

## 2023-11-13 DIAGNOSIS — G4730 Sleep apnea, unspecified: Secondary | ICD-10-CM | POA: Insufficient documentation

## 2023-11-13 DIAGNOSIS — E785 Hyperlipidemia, unspecified: Secondary | ICD-10-CM | POA: Diagnosis not present

## 2023-11-13 DIAGNOSIS — R1312 Dysphagia, oropharyngeal phase: Secondary | ICD-10-CM | POA: Insufficient documentation

## 2023-11-13 DIAGNOSIS — R519 Headache, unspecified: Secondary | ICD-10-CM | POA: Diagnosis not present

## 2023-11-13 DIAGNOSIS — M797 Fibromyalgia: Secondary | ICD-10-CM | POA: Diagnosis not present

## 2023-11-13 DIAGNOSIS — K648 Other hemorrhoids: Secondary | ICD-10-CM | POA: Diagnosis not present

## 2023-11-13 DIAGNOSIS — K5730 Diverticulosis of large intestine without perforation or abscess without bleeding: Secondary | ICD-10-CM | POA: Diagnosis not present

## 2023-11-13 DIAGNOSIS — Z860101 Personal history of adenomatous and serrated colon polyps: Secondary | ICD-10-CM | POA: Insufficient documentation

## 2023-11-13 DIAGNOSIS — M1990 Unspecified osteoarthritis, unspecified site: Secondary | ICD-10-CM | POA: Diagnosis not present

## 2023-11-13 DIAGNOSIS — F419 Anxiety disorder, unspecified: Secondary | ICD-10-CM | POA: Diagnosis not present

## 2023-11-13 DIAGNOSIS — I10 Essential (primary) hypertension: Secondary | ICD-10-CM | POA: Diagnosis not present

## 2023-11-13 DIAGNOSIS — E119 Type 2 diabetes mellitus without complications: Secondary | ICD-10-CM | POA: Diagnosis not present

## 2023-11-13 DIAGNOSIS — I130 Hypertensive heart and chronic kidney disease with heart failure and stage 1 through stage 4 chronic kidney disease, or unspecified chronic kidney disease: Secondary | ICD-10-CM | POA: Diagnosis not present

## 2023-11-13 DIAGNOSIS — Z79890 Hormone replacement therapy (postmenopausal): Secondary | ICD-10-CM | POA: Diagnosis not present

## 2023-11-13 DIAGNOSIS — K219 Gastro-esophageal reflux disease without esophagitis: Secondary | ICD-10-CM | POA: Diagnosis not present

## 2023-11-13 DIAGNOSIS — I5032 Chronic diastolic (congestive) heart failure: Secondary | ICD-10-CM | POA: Diagnosis not present

## 2023-11-13 DIAGNOSIS — R1310 Dysphagia, unspecified: Secondary | ICD-10-CM | POA: Diagnosis not present

## 2023-11-13 DIAGNOSIS — J45909 Unspecified asthma, uncomplicated: Secondary | ICD-10-CM | POA: Insufficient documentation

## 2023-11-13 HISTORY — PX: ESOPHAGEAL DILATION: SHX303

## 2023-11-13 HISTORY — PX: ESOPHAGOGASTRODUODENOSCOPY: SHX5428

## 2023-11-13 SURGERY — EGD (ESOPHAGOGASTRODUODENOSCOPY)
Anesthesia: General

## 2023-11-13 MED ORDER — GLYCOPYRROLATE PF 0.2 MG/ML IJ SOSY
PREFILLED_SYRINGE | INTRAMUSCULAR | Status: AC
  Filled 2023-11-13: qty 1

## 2023-11-13 MED ORDER — LACTATED RINGERS IV SOLN: INTRAVENOUS | Status: DC

## 2023-11-13 MED ORDER — LIDOCAINE 2% (20 MG/ML) 5 ML SYRINGE
INTRAMUSCULAR | Status: AC
  Filled 2023-11-13: qty 5

## 2023-11-13 MED ORDER — GLYCOPYRROLATE PF 0.2 MG/ML IJ SOSY
PREFILLED_SYRINGE | INTRAMUSCULAR | Status: DC | PRN
  Administered 2023-11-13 (×4): .1 mg via INTRAVENOUS

## 2023-11-13 MED ORDER — PROPOFOL 10 MG/ML IV BOLUS
INTRAVENOUS | Status: DC | PRN
  Administered 2023-11-13: 40 mg via INTRAVENOUS
  Administered 2023-11-13: 70 mg via INTRAVENOUS

## 2023-11-13 MED ORDER — PROPOFOL 500 MG/50ML IV EMUL
INTRAVENOUS | Status: DC | PRN
  Administered 2023-11-13: 150 ug/kg/min via INTRAVENOUS

## 2023-11-13 MED ORDER — ONDANSETRON HCL 4 MG/2ML IJ SOLN
INTRAMUSCULAR | Status: DC | PRN
  Administered 2023-11-13: 4 mg via INTRAVENOUS

## 2023-11-13 MED ORDER — LIDOCAINE 2% (20 MG/ML) 5 ML SYRINGE
INTRAMUSCULAR | Status: DC | PRN
  Administered 2023-11-13: 40 mg via INTRAVENOUS
  Administered 2023-11-13: 60 mg via INTRAVENOUS

## 2023-11-13 MED ORDER — ONDANSETRON HCL 4 MG/2ML IJ SOLN
INTRAMUSCULAR | Status: AC
Start: 2023-11-13 — End: ?
  Filled 2023-11-13: qty 2

## 2023-11-13 NOTE — Transfer of Care (Signed)
 Immediate Anesthesia Transfer of Care Note  Patient: [name]  Procedure(s) Performed: EGD (ESOPHAGOGASTRODUODENOSCOPY) DILATION, ESOPHAGUS  Patient Location: Endoscopy Unit  Anesthesia Type:General  Level of Consciousness: drowsy and patient cooperative  Airway & Oxygen  Therapy: Patient Spontanous Breathing  Post-op Assessment: Report given to RN and Post -op Vital signs reviewed and stable  Post vital signs: Reviewed and stable  Last Vitals:  Vitals Value Taken Time  BP 114/60 11/13/23 1000  Temp 36.4 C 11/13/23 1000  Pulse 93 11/13/23 1000  Resp 17 11/13/23 1000  SpO2 93 % 11/13/23 1000    Last Pain:  Vitals:   11/13/23 1000  TempSrc: Oral  PainSc: 0-No pain      Patients Stated Pain Goal: 3 (11/13/23 0753)  Complications: No notable events documented.

## 2023-11-13 NOTE — Interval H&P Note (Signed)
 History and Physical Interval Note:  11/13/2023 9:30 AM  [name]  has presented today for surgery, with the diagnosis of dysphagia, early satiety, GERD.  The various methods of treatment have been discussed with the patient and family. After consideration of risks, benefits and other options for treatment, the patient has consented to  Procedure(s) with comments: EGD (ESOPHAGOGASTRODUODENOSCOPY) (N/A) - 2:30 pm. asa 3, ok for rooms 1/2 per Tammy DILATION, ESOPHAGUS (N/A) as a surgical intervention.  The patient's history has been reviewed, patient examined, no change in status, stable for surgery.  I have reviewed the patient's chart and labs.  Questions were answered to the patient's satisfaction.     no change.  EGD with esophageal dilation as feasible/appropriate per plan.  The risks, benefits, limitations, alternatives and imponderables have been reviewed with the patient. Potential for esophageal dilation, biopsy, etc. have also been reviewed.  Questions have been answered. All parties agreeable.  Garnette Ka

## 2023-11-13 NOTE — Anesthesia Preprocedure Evaluation (Addendum)
 Anesthesia Evaluation  Patient identified by MRN, date of birth, ID band Patient awake    Reviewed: Allergy  & Precautions, H&P , NPO status , Patient's Chart, lab work & pertinent test results  History of Anesthesia Complications (+) PONV and history of anesthetic complications  Airway Mallampati: II  TM Distance: >3 FB Neck ROM: Full    Dental  (+) Edentulous Upper   Pulmonary asthma , sleep apnea   Pt did not use inhaler this am; lungs CTA Pulmonary exam normal breath sounds clear to auscultation       Cardiovascular hypertension, Normal cardiovascular exam Rhythm:Regular Rate:Normal  Ef 60-65%   Neuro/Psych  Headaches PSYCHIATRIC DISORDERS Anxiety Depression   Dementia  Neuromuscular disease    GI/Hepatic Neg liver ROS, hiatal hernia,GERD  ,,  Endo/Other  diabetes    Renal/GU Renal disease  negative genitourinary   Musculoskeletal  (+) Arthritis ,  Fibromyalgia -  Abdominal   Peds negative pediatric ROS (+)  Hematology negative hematology ROS (+)   Anesthesia Other Findings   Reproductive/Obstetrics negative OB ROS                             Anesthesia Physical Anesthesia Plan  ASA: 3  Anesthesia Plan: General   Post-op Pain Management:    Induction: Intravenous  PONV Risk Score and Plan:   Airway Management Planned: Nasal Cannula  Additional Equipment:   Intra-op Plan:   Post-operative Plan:   Informed Consent: I have reviewed the patients History and Physical, chart, labs and discussed the procedure including the risks, benefits and alternatives for the proposed anesthesia with the patient or authorized representative who has indicated his/her understanding and acceptance.     Dental advisory given  Plan Discussed with: CRNA  Anesthesia Plan Comments:        Anesthesia Quick Evaluation

## 2023-11-13 NOTE — Discharge Instructions (Addendum)
 EGD Discharge instructions Please read the instructions outlined below and refer to this sheet in the next few weeks. These discharge instructions provide you with general information on caring for yourself after you leave the hospital. Your doctor may also give you specific instructions. While your treatment has been planned according to the most current medical practices available, unavoidable complications occasionally occur. If you have any problems or questions after discharge, please call your doctor. ACTIVITY You may resume your regular activity but move at a slower pace for the next 24 hours.  Take frequent rest periods for the next 24 hours.  Walking will help expel (get rid of) the air and reduce the bloated feeling in your abdomen.  No driving for 24 hours (because of the anesthesia (medicine) used during the test).  You may shower.  Do not sign any important legal documents or operate any machinery for 24 hours (because of the anesthesia used during the test).  NUTRITION Drink plenty of fluids.  You may resume your normal diet.  Begin with a light meal and progress to your normal diet.  Avoid alcoholic beverages for 24 hours or as instructed by your caregiver.  MEDICATIONS You may resume your normal medications unless your caregiver tells you otherwise.  WHAT YOU CAN EXPECT TODAY You may experience abdominal discomfort such as a feeling of fullness or "gas" pains.  FOLLOW-UP Your doctor will discuss the results of your test with you.  SEEK IMMEDIATE MEDICAL ATTENTION IF ANY OF THE FOLLOWING OCCUR: Excessive nausea (feeling sick to your stomach) and/or vomiting.  Severe abdominal pain and distention (swelling).  Trouble swallowing.  Temperature over 101 F (37.8 C).  Rectal bleeding or vomiting of blood.       Your esophagus was dilated today  Will plan to see you back in the office in 6 months

## 2023-11-13 NOTE — Anesthesia Postprocedure Evaluation (Signed)
 Anesthesia Post Note  Patient: [name]  Procedure(s) Performed: EGD (ESOPHAGOGASTRODUODENOSCOPY) DILATION, ESOPHAGUS  Patient location during evaluation: PACU Anesthesia Type: General Level of consciousness: awake and alert Pain management: pain level controlled Vital Signs Assessment: post-procedure vital signs reviewed and stable Respiratory status: spontaneous breathing, nonlabored ventilation, respiratory function stable and patient connected to nasal cannula oxygen  Cardiovascular status: blood pressure returned to baseline and stable Postop Assessment: no apparent nausea or vomiting Anesthetic complications: no  No notable events documented.   Last Vitals:  Vitals:   11/13/23 0753 11/13/23 1000  BP: (!) 152/77 114/60  Pulse: 76 93  Resp: 12 17  Temp: 37.1 C 36.4 C  SpO2: 98% 93%    Last Pain:  Vitals:   11/13/23 1000  TempSrc: Oral  PainSc: 0-No pain                 Beacher L[place]ick

## 2023-11-13 NOTE — Op Note (Signed)
 [facility] Patient Name: [name] Procedure Date: 11/13/2023 9:26 AM MRN: [id number] Date of Birth: June 14, 1946 Attending MD: Gemma Kelp , MD, [id number] CSN: [id number] Age: 78 Admit Type: Outpatient Procedure:                Upper GI endoscopy Indications:              Dysphagia Providers:                Gemma Kelp, MD, Troy Furnish. Hazeline Lister RN, RN,                            Alisa App, Annell Barrow Referring MD:              Medicines:                Propofol  per Anesthesia Complications:            No immediate complications. Estimated Blood Loss:      Procedure:                Pre-Anesthesia Assessment:                           - Prior to the procedure, a History and Physical                            was performed, and patient medications and                            allergies were reviewed. The patient's tolerance of                            previous anesthesia was also reviewed. The risks                            and benefits of the procedure and the sedation                            options and risks were discussed with the patient.                            All questions were answered, and informed consent                            was obtained. Prior Anticoagulants: The patient has                            taken no anticoagulant or antiplatelet agents. ASA                            Grade Assessment: II - A patient with mild systemic                            disease. After reviewing the risks and benefits,  the patient was deemed in satisfactory condition to                            undergo the procedure.                           After obtaining informed consent, the endoscope was                            passed under direct vision. Throughout the                            procedure, the patient's blood pressure, pulse, and                            oxygen  saturations were monitored  continuously. The                            GIF-H190 ([id number]) scope was introduced through the                            mouth, and advanced to the second part of duodenum.                            The upper GI endoscopy was accomplished without                            difficulty. The patient tolerated the procedure                            well. Scope In: 9:44:12 AM Scope Out: 9:54:15 AM Total Procedure Duration: 0 hours 10 minutes 3 seconds  Findings:      The examined esophagus was normal.      A large hiatal hernia was present.      The duodenal bulb and second portion of the duodenum were normal.      A non-obstructing Schatzki ring was found at the gastroesophageal       junction. The scope was withdrawn. Dilation was performed with a Maloney       dilator with mild resistance at 56 Fr. The dilation site was examined       following endoscope reinsertion and showed no change. Estimated blood       loss: none. Ring persistent after passage of a 56 French Maloney dilator       subsequently three-quarter bites of the ring were taken with cold biopsy       forceps to disrupt it. Impression:               - Normal esophagus. Dilated.                           - Large hiatal hernia.                           - Normal duodenal bulb and second portion of the  duodenum.                           - Non-obstructing Schatzki ring. Schatzki's ring                            dilated and disrupted.                           - No specimens collected. Moderate Sedation:      Moderate (conscious) sedation was personally administered by an       anesthesia professional. The following parameters were monitored: oxygen        saturation, heart rate, blood pressure, respiratory rate, EKG, adequacy       of pulmonary ventilation, and response to care. Recommendation:           - Patient has a contact number available for                            emergencies. The  signs and symptoms of potential                            delayed complications were discussed with the                            patient. Return to normal activities tomorrow.                            Written discharge instructions were provided to the                            patient.                           - Advance diet as tolerated.                           - Continue present medications.                           - Return to my office in 6 months. Procedure Code(s):        --- Professional ---                           [phone], Esophagogastroduodenoscopy, flexible,                            transoral; diagnostic, including collection of                            specimen(s) by brushing or washing, when performed                            (separate procedure)                           43450, Dilation of esophagus, by unguided sound or  bougie, single or multiple passes Diagnosis Code(s):        --- Professional ---                           K44.9, Diaphragmatic hernia without obstruction or                            gangrene                           K22.2, Esophageal obstruction                           R13.10, Dysphagia, unspecified CPT copyright 2022 American Medical Association. All rights reserved. The codes documented in this report are preliminary and upon coder review may  be revised to meet current compliance requirements. [name]. [name], MD Gemma Kelp, MD 11/13/2023 10:04:10 AM This report has been signed electronically. Number of Addenda: 0

## 2023-11-14 ENCOUNTER — Encounter (HOSPITAL_COMMUNITY): Payer: Self-pay | Admitting: Internal Medicine

## 2023-11-18 ENCOUNTER — Encounter: Attending: Internal Medicine | Admitting: Emergency Medicine

## 2023-11-18 VITALS — BP 139/70 | HR 62 | Temp 97.9°F | Resp 16

## 2023-11-18 DIAGNOSIS — M810 Age-related osteoporosis without current pathological fracture: Secondary | ICD-10-CM | POA: Diagnosis not present

## 2023-11-18 MED ORDER — ZOLEDRONIC ACID 5 MG/100ML IV SOLN
5.0000 mg | Freq: Once | INTRAVENOUS | Status: AC
  Administered 2023-11-18: 5 mg via INTRAVENOUS

## 2023-11-18 MED ORDER — DIPHENHYDRAMINE HCL 25 MG PO CAPS
25.0000 mg | ORAL_CAPSULE | Freq: Once | ORAL | Status: AC
  Administered 2023-11-18: 25 mg via ORAL

## 2023-11-18 MED ORDER — ACETAMINOPHEN 325 MG PO TABS
650.0000 mg | ORAL_TABLET | Freq: Once | ORAL | Status: AC
  Administered 2023-11-18: 650 mg via ORAL

## 2023-11-18 NOTE — Progress Notes (Signed)
 Diagnosis: Osteoporosis  Provider:  Izetta Marshall MD  Procedure: IV Infusion  IV Type: Peripheral, IV Location: L Antecubital  Reclast (Zolendronic Acid), Dose: 5 mg  Infusion Start Time: 1405  Infusion Stop Time: 1437  Post Infusion IV Care: Observation period completed and Peripheral IV Discontinued  Discharge: Condition: Good, Destination: Home . AVS Provided  Performed by:  Arlina Benjamin, RN

## 2023-11-25 DIAGNOSIS — G43909 Migraine, unspecified, not intractable, without status migrainosus: Secondary | ICD-10-CM | POA: Diagnosis not present

## 2023-12-12 ENCOUNTER — Other Ambulatory Visit: Payer: Self-pay | Admitting: Cardiology

## 2023-12-20 ENCOUNTER — Encounter (HOSPITAL_COMMUNITY): Payer: Self-pay | Admitting: Interventional Radiology

## 2023-12-31 DIAGNOSIS — Z8669 Personal history of other diseases of the nervous system and sense organs: Secondary | ICD-10-CM | POA: Diagnosis not present

## 2023-12-31 DIAGNOSIS — Z09 Encounter for follow-up examination after completed treatment for conditions other than malignant neoplasm: Secondary | ICD-10-CM | POA: Diagnosis not present

## 2024-01-19 ENCOUNTER — Ambulatory Visit (INDEPENDENT_AMBULATORY_CARE_PROVIDER_SITE_OTHER): Admitting: Orthopedic Surgery

## 2024-01-19 DIAGNOSIS — M25512 Pain in left shoulder: Secondary | ICD-10-CM | POA: Diagnosis not present

## 2024-01-19 MED ORDER — METHYLPREDNISOLONE ACETATE 40 MG/ML IJ SUSP
40.0000 mg | Freq: Once | INTRAMUSCULAR | Status: AC
  Administered 2024-01-19: 40 mg via INTRA_ARTICULAR

## 2024-01-19 NOTE — Progress Notes (Signed)
   Chief Complaint  Patient presents with   Shoulder Pain    Left     This is a 78 year old female with recurrent left periscapular pain she has a history of cervical degenerative disc disease.  Her pain is located over the superior medial border of the scapula    Shoulder Pain   She did not has any trauma  There is no numbness or tingling  No pain in the shoulder joint itself  Physical Exam Vitals and nursing note reviewed.  Constitutional:      Appearance: Normal appearance.  HENT:     Head: Normocephalic and atraumatic.  Eyes:     General: No scleral icterus.       Right eye: No discharge.        Left eye: No discharge.     Extraocular Movements: Extraocular movements intact.     Conjunctiva/sclera: Conjunctivae normal.     Pupils: Pupils are equal, round, and reactive to light.  Cardiovascular:     Rate and Rhythm: Normal rate.     Pulses: Normal pulses.  Musculoskeletal:     Comments: Left shoulder exam normal range of motion of the left shoulder and the 80 ducted position.  No pain or restriction of motion with internal/external rotation.  Tenderness is noted at the superior border and medial border of the scapula and this tracks into the soft tissue along the rhomboids and trapezius muscle towards the neck.  Skin:    General: Skin is warm and dry.     Capillary Refill: Capillary refill takes less than 2 seconds.  Neurological:     General: No focal deficit present.     Mental Status: She is alert and oriented to person, place, and time.  Psychiatric:        Mood and Affect: Mood normal.        Behavior: Behavior normal.        Thought Content: Thought content normal.        Judgment: Judgment normal.    No imaging was done.   This appears to be a trigger point or periscapular pain related to the patient's degenerative disc condition in her cervical spine  Recommend cortisone injection  TRIGGER POINT INJECTION  Patient consented verbally for  injection of the right posterior/MEDIAL scapula. Timeout confirmed the site of injection A steroid injection was performed at inferior border of the right scapula at the point of maximal tenderness using 1% plain Lidocaine  and 40 mg of Depo-Medrol . This was well tolerated.

## 2024-01-19 NOTE — Progress Notes (Signed)
   There were no vitals taken for this visit.  There is no height or weight on file to calculate BMI.  Chief Complaint  Patient presents with   Shoulder Pain    Left     No diagnosis found.  DOI/DOS/ Date: ongoing  states has seen Dr Margrette for it in the past and had injection   Worse

## 2024-01-20 ENCOUNTER — Other Ambulatory Visit (HOSPITAL_COMMUNITY): Payer: Self-pay | Admitting: Internal Medicine

## 2024-01-20 DIAGNOSIS — Z1231 Encounter for screening mammogram for malignant neoplasm of breast: Secondary | ICD-10-CM

## 2024-02-06 ENCOUNTER — Other Ambulatory Visit: Payer: Self-pay | Admitting: Cardiology

## 2024-02-18 ENCOUNTER — Encounter (HOSPITAL_COMMUNITY): Payer: Self-pay

## 2024-02-18 ENCOUNTER — Ambulatory Visit (HOSPITAL_COMMUNITY)
Admission: RE | Admit: 2024-02-18 | Discharge: 2024-02-18 | Disposition: A | Discharge status: Home / Self Care | Source: Ambulatory Visit | Attending: Internal Medicine | Admitting: Internal Medicine

## 2024-02-18 DIAGNOSIS — G43909 Migraine, unspecified, not intractable, without status migrainosus: Secondary | ICD-10-CM | POA: Diagnosis not present

## 2024-02-18 DIAGNOSIS — Z1231 Encounter for screening mammogram for malignant neoplasm of breast: Secondary | ICD-10-CM | POA: Diagnosis not present

## 2024-02-19 ENCOUNTER — Ambulatory Visit (HOSPITAL_COMMUNITY)

## 2024-03-29 ENCOUNTER — Other Ambulatory Visit: Payer: Self-pay | Admitting: Internal Medicine

## 2024-04-07 DIAGNOSIS — L2989 Other pruritus: Secondary | ICD-10-CM | POA: Diagnosis not present

## 2024-04-09 ENCOUNTER — Other Ambulatory Visit: Payer: Self-pay | Admitting: Gastroenterology

## 2024-04-09 DIAGNOSIS — K219 Gastro-esophageal reflux disease without esophagitis: Secondary | ICD-10-CM

## 2024-04-12 ENCOUNTER — Telehealth: Payer: Self-pay | Admitting: Family Medicine

## 2024-04-12 NOTE — Telephone Encounter (Signed)
 Patient returning phone call to confirm appointment.

## 2024-04-14 NOTE — Patient Instructions (Addendum)
 Below is our plan:  We will switch you to Emgality . Take 2 injections for the first dose on 04/26/2024. If well tolerated, continue 1 injection every 30 days. Start nortriptyline 10mg  daily at bedtime. Monitor closely for any worsening of dry eye. Continue Nurtec as needed   Please make sure you are staying well hydrated. I recommend 50-60 ounces daily. Well balanced diet and regular exercise encouraged. Consistent sleep schedule with 6-8 hours recommended.   Please continue follow up with care team as directed.   Follow up with me in 6 months   You may receive a survey regarding today's visit. I encourage you to leave honest feed back as I do use this information to improve patient care. Thank you for seeing me today!   GENERAL HEADACHE INFORMATION:   Natural supplements: Magnesium  Oxide or Magnesium  Glycinate 500 mg at bed (up to 800 mg daily) Coenzyme Q10 300 mg in AM Vitamin B2- 200 mg twice a day   Add 1 supplement at a time since even natural supplements can have undesirable side effects. You can sometimes buy supplements cheaper (especially Coenzyme Q10) at www.webmailguide.co.za or at [facility].  Migraine with aura: There is increased risk for stroke in women with migraine with aura and a contraindication for the combined contraceptive pill for use by women who have migraine with aura. The risk for women with migraine without aura is lower. However other risk factors like smoking are far more likely to increase stroke risk than migraine. There is a recommendation for no smoking and for the use of OCPs without estrogen such as progestogen only pills particularly for women with migraine with aura.SABRA People who have migraine headaches with auras may be 3 times more likely to have a stroke caused by a blood clot, compared to migraine patients who don't see auras. Women who take hormone-replacement therapy may be 30 percent more likely to suffer a clot-based stroke than women not taking medication  containing estrogen. Other risk factors like smoking and high blood pressure may be  much more important.    Vitamins and herbs that show potential:   Magnesium : Magnesium  (250 mg twice a day or 500 mg at bed) has a relaxant effect on smooth muscles such as blood vessels. Individuals suffering from frequent or daily headache usually have low magnesium  levels which can be increase with daily supplementation of 400-750 mg. Three trials found 40-90% average headache reduction  when used as a preventative. Magnesium  may help with headaches are aura, the best evidence for magnesium  is for migraine with aura is its thought to stop the cortical spreading depression we believe is the pathophysiology of migraine aura.Magnesium  also demonstrated the benefit in menstrually related migraine.  Magnesium  is part of the messenger system in the serotonin cascade and it is a good muscle relaxant.  It is also useful for constipation which can be a side effect of other medications used to treat migraine. Good sources include nuts, whole grains, and tomatoes. Side Effects: loose stool/diarrhea  Riboflavin (vitamin B 2) 200 mg twice a day. This vitamin assists nerve cells in the production of ATP a principal energy storing molecule.  It is necessary for many chemical reactions in the body.  There have been at least 3 clinical trials of riboflavin using 400 mg per day all of which suggested that migraine frequency can be decreased.  All 3 trials showed significant improvement in over half of migraine sufferers.  The supplement is found in bread, cereal, milk, meat, and  poultry.  Most Americans get more riboflavin than the recommended daily allowance, however riboflavin deficiency is not necessary for the supplements to help prevent headache. Side effects: energizing, green urine   Coenzyme Q10: This is present in almost all cells in the body and is critical component for the conversion of energy.  Recent studies have shown that a  nutritional supplement of CoQ10 can reduce the frequency of migraine attacks by improving the energy production of cells as with riboflavin.  Doses of 150 mg twice a day have been shown to be effective.   Melatonin: Increasing evidence shows correlation between melatonin secretion and headache conditions.  Melatonin supplementation has decreased headache intensity and duration.  It is widely used as a sleep aid.  Sleep is natures way of dealing with migraine.  A dose of 3 mg is recommended to start for headaches including cluster headache. Higher doses up to 15 mg has been reviewed for use in Cluster headache and have been used. The rationale behind using melatonin for cluster is that many theories regarding the cause of Cluster headache center around the disruption of the normal circadian rhythm in the brain.  This helps restore the normal circadian rhythm.   HEADACHE DIET: Foods and beverages which may trigger migraine Note that only 20% of headache patients are food sensitive. You will know if you are food sensitive if you get a headache consistently 20 minutes to 2 hours after eating a certain food. Only cut out a food if it causes headaches, otherwise you might remove foods you enjoy! What matters most for diet is to eat a well balanced healthy diet full of vegetables and low fat protein, and to not miss meals.   Chocolate, other sweets ALL cheeses except cottage and cream cheese Dairy products, yogurt, sour cream, ice cream Liver Meat extracts (Bovril, Marmite, meat tenderizers) Meats or fish which have undergone aging, fermenting, pickling or smoking. These include: Hotdogs,salami,Lox,sausage, mortadellas,smoked salmon, pepperoni, Pickled herring Pods of broad bean (English beans, Chinese pea pods, Italian (fava) beans, lima and navy beans Ripe avocado, ripe banana Yeast extracts or active yeast preparations such as Brewer's or Fleishman's (commercial bakes goods are permitted) Tomato based  foods, pizza (lasagna, etc.)   MSG (monosodium glutamate) is disguised as many things; look for these common aliases: Monopotassium glutamate Autolysed yeast Hydrolysed protein Sodium caseinate "flavorings" "all natural preservatives Nutrasweet   Avoid all other foods that convincingly provoke headaches.   Resources: The Dizzy Bluford Aid Your Headache Diet, migrainestrong.com  https://zamora-andrews.com/   Caffeine and Migraine For patients that have migraine, caffeine intake more than 3 days per week can lead to dependency and increased migraine frequency. I would recommend cutting back on your caffeine intake as best you can. The recommended amount of caffeine is 200-300 mg daily, although migraine patients may experience dependency at even lower doses. While you may notice an increase in headache temporarily, cutting back will be helpful for headaches in the long run. For more information on caffeine and migraine, visit: https://americanmigrainefoundation.org/resource-library/caffeine-and-migraine/   Headache Prevention Strategies:   1. Maintain a headache diary; learn to identify and avoid triggers.  - This can be a simple note where you log when you had a headache, associated symptoms, and medications used - There are several smartphone apps developed to help track migraines: Migraine Buddy, Migraine Monitor, Curelator N1-Headache App   Common triggers include: Emotional triggers: Emotional/Upset family or friends Emotional/Upset occupation Business reversal/success Anticipation anxiety Crisis-serious Post-crisis periodNew job/position   Physical triggers:  Vacation Day Weekend Strenuous Exercise High Altitude Location New Move Menstrual Day Physical Illness Oversleep/Not enough sleep Weather changes Light: Photophobia or light sesnitivity treatment involves a balance between desensitization and reduction in overly strong  input. Use dark polarized glasses outside, but not inside. Avoid bright or fluorescent light, but do not dim environment to the point that going into a normally lit room hurts. Consider FL-41 tint lenses, which reduce the most irritating wavelengths without blocking too much light.  These can be obtained at axonoptics.com or theraspecs.com Foods: see list above.   2. Limit use of acute treatments (over-the-counter medications, triptans, etc.) to no more than 2 days per week or 10 days per month to prevent medication overuse headache (rebound headache).     3. Follow a regular schedule (including weekends and holidays): Don't skip meals. Eat a balanced diet. 8 hours of sleep nightly. Minimize stress. Exercise 30 minutes per day. Being overweight is associated with a 5 times increased risk of chronic migraine. Keep well hydrated and drink 6-8 glasses of water  per day.   4. Initiate non-pharmacologic measures at the earliest onset of your headache. Rest and quiet environment. Relax and reduce stress. Breathe2Relax is a free app that can instruct you on    some simple relaxtion and breathing techniques. Http://Dawnbuse.com is a    free website that provides teaching videos on relaxation.  Also, there are  many apps that   can be downloaded for "mindful" relaxation.  An app called YOGA NIDRA will help walk you through mindfulness. Another app called Calm can be downloaded to give you a structured mindfulness guide with daily reminders and skill development. Headspace for guided meditation Mindfulness Based Stress Reduction Online Course: www.palousemindfulness.com Cold compresses.   5. Don't wait!! Take the maximum allowable dosage of prescribed medication at the first sign of migraine.   6. Compliance:  Take prescribed medication regularly as directed and at the first sign of a migraine.   7. Communicate:  Call your physician when problems arise, especially if your headaches change, increase in  frequency/severity, or become associated with neurological symptoms (weakness, numbness, slurred speech, etc.). Proceed to emergency room if you experience new or worsening symptoms or symptoms do not resolve, if you have new neurologic symptoms or if headache is severe, or for any concerning symptom.   8. Headache/pain management therapies: Consider various complementary methods, including medication, behavioral therapy, psychological counselling, biofeedback, massage therapy, acupuncture, dry needling, and other modalities.  Such measures may reduce the need for medications. Counseling for pain management, where patients learn to function and ignore/minimize their pain, seems to work very well.   9. Recommend changing family's attention and focus away from patient's headaches. Instead, emphasize daily activities. If first question of day is 'How are your headaches/Do you have a headache today?', then patient will constantly think about headaches, thus making them worse. Goal is to re-direct attention away from headaches, toward daily activities and other distractions.   10. Helpful Websites: www.AmericanHeadacheSociety.org patenthood.ch www.headaches.org tightmarket.nl www.achenet.org

## 2024-04-14 NOTE — Progress Notes (Signed)
 Chief Complaint  Patient presents with   RM2/MIGRAINES    Pt is here with her Daughter. Pt states that her migraines have been okay since last appointment.     HISTORY OF PRESENT ILLNESS:  04/19/24 ALL:  Mackenzi returns for follow up for migraines with aura. She was last seen by me 09/2023. We switched her from Emgality  to Ajovy  and added Nurtec for abortive therapy. Since, she reports no improvement. She is having daily headaches. She wakes with headaches most every. She reports headache always get bad around 3p. She will lie down from 3-6p. She does not sleep. She reports a long standing history of insomnia. She may get a couple of hours of sleep each night. She has been on multiple sleep aids through PCP and psychiatry. She failed Seroquel , Ambien , Remeron, doxepin and trazodone . Sleep study 2019 negative for sleep apnea. She did have a anesthesiologist tell her that she stopped breathing during surgery. She is not interested in repeating study at this time. Nurtec does not help much.   As we were talking about options, she tells met ath she never took Emgality  that was previously charted as a failed med. She reports not being able to get it from the pharmacy. Amovig and Botox have worked best for her in the past but stopped working after a couple of years.   Tried and failed: Prevention: Aimovig 70 mg monthly, Ajovy , Lisinopril  - cough, Losartan  100 mg daily, Atenolol  - fatigue, Topamax 100 mg daily, Gabapentin  300 mg at bedtime, Effexor  225 mg daily, Cymbalta  - rash, Botox, Qulipta  ? Tongue swelling, Rescue: Goody Powder, Seroquel  25 mg PRN, Ubrelvy , Imitrex, Nurtec   09/23/2023 ALL:  Khiya E Achenbach is a 78 y.o. female here today for follow up for migraines with aura. Previously followed by Dr Milton for dementia but has not been evaluated by our office. She was last seen by Dr Rush 04/2022 and reported worsening headaches. She was started on Qulipta  and Ubrelvy . She called 06/2022  reporting tongue swelling but unclear if related to Qulipta . She was switched to Emgality .   Since, she reports no improvement. She stopped Emgality  about a month ago as she didn't feel it was working. She continues to have daily headaches. Described as a tension, achy pain in the right temporal and occipital area. She has migrainous symptoms of light and sound sensitivity on occasion. She takes Tylenol  and Aleve  twice daily. She does not sleep well. She may get 3 hours of sleep at night. She has had chronic insomnia for years. Takes melatonin 9mg  at bedtime. Previous sleep study reportedly negative for sleep apnea. Recently stopped seeing psychiatry and psychology. She reports being told she did not need to return. She denies concerns of anxiety or depression but tells me she has seen psychiatry off and on for years. She has been hospitalized twice for amnesic events lasting approx 2 weeks each time. Once was admitted to behavioral health and second time was admitted to SNF. She lives at home with her daughter now. She rarely drives.   Not taking memory support agents. Last MRI brain showed 1. Mild chronic white matter disease, most likely related to chronic microangiopathy. 2. Moderate parenchymal volume loss, more pronounced in the bilateral parietal regions and right perisylvian. She continues close follow up with PCP.   Prior Therapies  Prevention Aimovig 70 mg monthly Lisinopril  - cough Losartan  100 mg daily Atenolol  - fatigue Topamax 100 mg daily Gabapentin  300 mg QHS Effexor  225 mg daily Cymbalta  - rash Botox Qulipta  ? Tongue swelling   Rescue Goody Powder Seroquel  25 mg PRN Ubrelvy  Imitrex   HISTORY (copied from Dr Merna previous note)  Medical co-morbidities: HTN, CHF, OSA, hyperparathyroidism, prediabetes, asthma, CKD 3, HLD, depression, anxiety, dementia   The patient presents for evaluation of migraines which have been present for several  years. They have been worsening in frequency since March. Migraines are described as frontal or left retro-orbital pain with associated photophobia, phonophobia, and nausea. She does occasionally have an aura of flashing lights in her vision. Headaches can last for several hours at a time.   She follows with Neurology for dementia and migraines (last seen 03/05/22) and was has tried multiple preventive medications including Botox. She was most recently on Aimovig for 2 years, but stopped it as she did not think it was effective. She is currently taking Anastacia Powder nearly every day for her headaches.   Headache History: Aura: lights in vision Location: front, left retro-orbital, neck Associated Symptoms:             Photophobia: yes             Phonophobia: yes             Nausea: yes Worse with activity?: yes Duration of headaches: several hours   Headache days per month: 14 Headache free days per month: 16   Current Treatment: Abortive Goody Powder   Preventative none   Prior Therapies                                 Prevention Aimovig 70 mg monthly Lisinopril  - cough Losartan  100 mg daily Atenolol  - fatigue Topamax 100 mg daily Gabapentin  300 mg QHS Effexor  225 mg daily Cymbalta  - rash Botox   Rescue Goody Powder Seroquel  25 mg PRN   REVIEW OF SYSTEMS: Out of a complete 14 system review of symptoms, the patient complains only of the following symptoms, headaches, intermittent memory loss and all other reviewed systems are negative.   ALLERGIES: Allergies  Allergen Reactions   Elavil  [Amitriptyline ] Other (See Comments)    Felt really nervous and felt like something was hold her feet or arm and/ or AM headache on it and back on it and went away off it.    Abilify [Aripiprazole] Other (See Comments)    Dystonic reaction   Codeine Hives, Nausea Only and Other (See Comments)    Feels funny   Latex Hives   Losartan      Tongue Swelling   Penicillins Hives, Itching  and Other (See Comments)    Has patient had a PCN reaction causing immediate rash, facial/tongue/throat swelling, SOB or lightheadedness with hypotension: Yes Has patient had a PCN reaction causing severe rash involving mucus membranes or skin necrosis: No Has patient had a PCN reaction that required hospitalization No Has patient had a PCN reaction occurring within the last 10 years: No If all of the above answers are NO, then may proceed with Cephalosporin use.    Polyethylene Glycol Other (See Comments)    Per allergy  test   Spironolactone      Hyperkalemia   Sulfonamide Derivatives Nausea And Vomiting   Cymbalta  [Duloxetine  Hcl] Rash   Remeron [Mirtazapine] Other (See Comments)    Caused lots  of strange, weird, crazy dreams.     HOME MEDICATIONS: Outpatient Medications Prior to Visit  Medication Sig Dispense Refill   albuterol  (PROVENTIL  HFA;VENTOLIN  HFA) 108 (90 BASE) MCG/ACT inhaler Inhale 2 puffs into the lungs every 6 (six) hours as needed for wheezing or shortness of breath.     amLODipine  (NORVASC ) 10 MG tablet Take 10 mg by mouth daily.     azelastine  (ASTELIN ) 0.1 % nasal spray 1-2 sprays each nostril twice daily as needed 30 mL 5   BREZTRI AEROSPHERE 160-9-4.8 MCG/ACT AERO Inhale into the lungs.     Calcium Carbonate-Vit D-Min (CALCIUM 1200 PO) Take by mouth daily.     carvedilol  (COREG ) 3.125 MG tablet TAKE ONE TABLET (3.125MG  TOTAL) BY MOUTHTWO TIMES DAILY WITH A MEAL 180 tablet 2   Cyanocobalamin (VITAMIN B12 PO) Take by mouth.     diphenhydrAMINE  HCl, Sleep, (ZZZQUIL PO) Take by mouth.     fluticasone  (FLONASE ) 50 MCG/ACT nasal spray Place 2 sprays into both nostrils daily. 48 g 1   furosemide  (LASIX ) 40 MG tablet TAKE ONE TABLET (40MG  TOTAL) BY MOUTH DAILY 90 tablet 1   hydrALAZINE  (APRESOLINE ) 100 MG tablet TAKE ONE TABLET (100MG  TOTAL) BY MOUTH THREE TIMES DAILY 90 tablet 3   levocetirizine (XYZAL ) 5 MG tablet TAKE ONE TABLET (5MG  TOTAL) BY MOUTH TWOTIMES DAILY AS  NEEDED FOR ALLERGIES OR ITCHING 30 tablet 5   levothyroxine (SYNTHROID) 25 MCG tablet Take 25 mcg by mouth daily.     lovastatin  (MEVACOR ) 40 MG tablet Take 40 mg by mouth at bedtime.     Melatonin 3 MG CAPS Take by mouth.     Multiple Vitamins-Minerals (CENTRUM SILVER 50+WOMEN PO) Take by mouth daily.     pantoprazole  (PROTONIX ) 40 MG tablet TAKE ONE TABLET (40MG  TOTAL) BY MOUTH TWO TIMES DAILY 60 tablet 5   Polyethylene Glycol 400 (BLINK TEARS OP) Apply to eye.     pregabalin (LYRICA) 50 MG capsule Take 50 mg by mouth 2 (two) times daily.     Rimegepant Sulfate (NURTEC) 75 MG TBDP Take 1 tablet (75 mg total) by mouth daily as needed (take for abortive therapy of migraine, no more than 1 tablet in 24 hours or 10 per month). 8 tablet 11   Specialty Vitamins Products (BLINK NUTRITEARS PO) Take by mouth.     Fremanezumab -vfrm (AJOVY ) 225 MG/1.5ML SOAJ Inject 225 mg into the skin every 30 (thirty) days. 4.5 mL 3   potassium chloride  SA (KLOR-CON  M) 20 MEQ tablet Take 1 tablet (20 mEq total) by mouth daily. (Patient taking differently: Take 20 mEq by mouth 2 (two) times daily.) 30 tablet 6   Azelastine -Fluticasone  137-50 MCG/ACT SUSP  (Patient not taking: Reported on 04/19/2024)     clobetasol cream (TEMOVATE) 0.05 % Apply 1 Application topically 2 (two) times daily. (Patient not taking: Reported on 04/19/2024)     doxepin (SINEQUAN) 25 MG capsule Take by mouth. (Patient not taking: Reported on 04/19/2024)     famotidine  (PEPCID ) 20 MG tablet TAKE TWO TABLETS (40MG  TOTAL) BY MOUTH AT BEDTIME (Patient not taking: Reported on 04/19/2024)     fluticasone  0.05%-ketoconazole 2% 1:1 cream mixture Apply topically. (Patient not taking: Reported on 04/19/2024)     ketoconazole (NIZORAL) 2 % cream Apply topically 2 (two) times daily as needed. (Patient not taking: Reported on 04/19/2024)     RESTASIS 0.05 % ophthalmic emulsion 1 drop 2 (two) times daily. (Patient not taking: Reported on 04/19/2024)      triamcinolone  cream (KENALOG) 0.1 % Apply topically 2 (two) times daily. (Patient not taking: Reported on 04/19/2024)     XIIDRA 5 % SOLN  (Patient not taking: Reported on 04/19/2024)     zoledronic  acid (RECLAST ) 5 MG/100ML SOLN injection Inject 100 mL by intravenous route. (Patient not taking: Reported on 04/19/2024)     No facility-administered medications prior to visit.     PAST MEDICAL HISTORY: Past Medical History:  Diagnosis Date   Adenomatous colon polyp 2006   excised in 2006 & 2010Due surveillance 06/2013   Anxiety    Anxiety and depression    Asthma    Breast mass    right nipple bengn mass per patient   Breast tumor    Chest discomfort    Chronic back pain    Chronic constipation    Collagen vascular disease    Complication of anesthesia    Depression    Diverticulosis    Fibromyalgia    Gastroesophageal reflux disease    Hiatal hernia    History of benign esophageal tumor 2010   granular cell esophageal tumor (Dx 06/2008), resected via EMR 2011, due repeat EGD 02/2012   Hyperlipidemia    Hypertension    Insomnia    Migraines    PONV (postoperative nausea and vomiting)    Psychosis (HCC)    Renal insufficiency    Sleep apnea    Stop Bang score of 5. Pt said she was told by Dr. Milton that she had a little bit of sleep apena, but not bad enough to treat.   Thyroid  nodule    Tumor of esophagus      PAST SURGICAL HISTORY: Past Surgical History:  Procedure Laterality Date   ABDOMINAL HYSTERECTOMY     BALLOON DILATION  08/23/2021   Procedure: BALLOON DILATION;  Surgeon: Cindie Carlin POUR, DO;  [facility];  Service: Endoscopy;;   BIOPSY  10/14/2019   Procedure: BIOPSY;  Surgeon: Shaaron Lamar HERO, MD;  [facility];  Service: Endoscopy;;   BRAVO PH STUDY  12/11/2011   Procedure: BRAVO PH STUDY;  Surgeon: Lamar HERO Shaaron, MD;  [facility];  Service: Endoscopy;  Laterality: N/A;   BREAST EXCISIONAL BIOPSY  1990s, 2012    Left x2-sclerosing ductal papilloma-2012   CHOLECYSTECTOMY N/A 04/09/2013   Procedure: LAPAROSCOPIC CHOLECYSTECTOMY;  Surgeon: Oneil DELENA Budge, MD;  Location: [facility];  Service: General;  Laterality: N/A;   COLONOSCOPY  06/2008, 06/2011   sigmoid tics, tubular adenoma; 2013: anal canal hemorrhoids   COLONOSCOPY  07/10/2011   Anal canal hemorrhoids likely the cause of hematochezia in the setting of constipation; otherwise normal rectum ;submucosal  petechiae in left colon of doubtful clinical significance; otherwise, normal colon   COLONOSCOPY WITH PROPOFOL  N/A 11/10/2017   cancelled in pre-op   COLONOSCOPY WITH PROPOFOL  N/A 07/23/2018   Procedure: COLONOSCOPY WITH PROPOFOL ;  Surgeon: Shaaron Lamar HERO, MD; 1 tubular adenoma, diverticulosis in the sigmoid and descending colon, nonbleeding internal hemorrhoids.  No recommendations to repeat due to age.   ESOPHAGEAL BRUSHING  08/23/2021   Procedure: ESOPHAGEAL BRUSHING;  Surgeon: Cindie Carlin POUR, DO;  [facility];  Service: Endoscopy;;   ESOPHAGEAL DILATION N/A 05/08/2015   Procedure: ESOPHAGEAL DILATION;  Surgeon: Lamar HERO Shaaron, MD;  Location: [facility];  Service: Endoscopy;  Laterality: N/AMERL Stai 54/56   ESOPHAGEAL DILATION N/A 11/13/2023   Procedure: DILATION, ESOPHAGUS;  Surgeon: Shaaron Lamar HERO, MD;  [facility];  Service: Endoscopy;  Laterality: N/A;   ESOPHAGOGASTRODUODENOSCOPY  12/11/2011   MFM:Wnwrmpuprjo Schatzki's ring; otherwise normal/Small hiatal hernia. Antral and bulbar erosions   ESOPHAGOGASTRODUODENOSCOPY N/A 11/13/2023   Procedure: EGD (ESOPHAGOGASTRODUODENOSCOPY);  Surgeon: Shaaron Lamar HERO, MD;  [facility];  Service: Endoscopy;  Laterality: N/A;  2:30 pm. asa 3, ok for rooms 1/2 per Tammy   ESOPHAGOGASTRODUODENOSCOPY (EGD) WITH PROPOFOL  N/A 05/08/2015   Dr. Shaaron: mild erosive reflux esophagitis, non-critical Schatzki's ring s/p dilation. Hiatal hernia.    ESOPHAGOGASTRODUODENOSCOPY (EGD) WITH  PROPOFOL  N/A 10/14/2019   Procedure: ESOPHAGOGASTRODUODENOSCOPY (EGD) WITH PROPOFOL ;  Surgeon: Shaaron Lamar HERO, MD;  Nonobstructing Schatzki ring, small hiatal hernia, abnormal gastric mucosa s/p biopsied, normal first and second portion of the duodenum.  Pathology with chronic inactive gastritis, no H. pylori.   ESOPHAGOGASTRODUODENOSCOPY (EGD) WITH PROPOFOL  N/A 08/23/2021   Surgeon: Cindie Carlin POUR, DO;   white speckled mucosa in the esophagus s/p cells obtained for cytology (KOH prep negative), medium-sized hiatal hernia, grade A reflux esophagitis, mild Schatzki's ring s/p dilation, normal examined stomach and duodenum.   EUS  08/2010   Dr [facility] with EGD. Retained food. No recurrent esophageal lesion, bx negative.   IR KYPHO THORACIC WITH BONE BIOPSY  10/17/2020   IR RADIOLOGIST EVAL & MGMT  09/12/2020   LUNG BIOPSY     negative   PARATHYROIDECTOMY  09/2015   [facility].    PARATHYROIDECTOMY     POLYPECTOMY  07/23/2018   Procedure: POLYPECTOMY;  Surgeon: Shaaron Lamar HERO, MD;  [facility];  Service: Endoscopy;;  colon   RIGHT OOPHORECTOMY     benign disease   SHOULDER ARTHROSCOPY     Right; bone spurs removed   TONSILLECTOMY     TOTAL KNEE ARTHROPLASTY  2002   Right; previous arthroscopic surgery     FAMILY HISTORY: Family History  Problem Relation Age of Onset   Heart attack Mother    Depression Mother    Anxiety disorder Mother    Migraines Mother    Stroke Father    Alcohol abuse Father    Migraines Father    Dementia Maternal Uncle    Colon cancer Paternal Aunt    Colon cancer Paternal Uncle    ADD / ADHD Neg Hx    Bipolar disorder Neg Hx    Drug abuse Neg Hx    OCD Neg Hx    Paranoid behavior Neg Hx    Schizophrenia Neg Hx    Seizures Neg Hx    Sexual abuse Neg Hx    Physical abuse Neg Hx      SOCIAL HISTORY: Social History   Socioeconomic History   Marital status: Single    Spouse name: Not on file   Number of children: 1   Years of  education: Not on file   Highest education level: Not on file  Occupational History   Occupation: disabled    Employer: RETIRED  Tobacco Use   Smoking status: Never   Smokeless tobacco: Never  Vaping Use   Vaping status: Never Used  Substance and Sexual Activity   Alcohol use: No    Alcohol/week: 0.0 standard drinks of alcohol   Drug use: No   Sexual activity: Never  Other Topics Concern   Not on file  Social History Narrative   Pt lives with daughter    Retired    Chief Executive Officer Drivers of Corporate Investment Banker Strain: Not on file  Food Insecurity: Not on Chartered Certified Accountant  Needs: Not on file  Physical Activity: Not on file  Stress: Not on file  Social Connections: Not on file  Intimate Partner Violence: Not on file     PHYSICAL EXAM  Vitals:   04/19/24 1325  BP: 128/78  Pulse: 77  SpO2: 98%  Weight: 201 lb 8 oz (91.4 kg)  Height: 5' 2 (1.575 m)    Body mass index is 36.85 kg/m.  Generalized: Well developed, in no acute distress  Cardiology: normal rate and rhythm, no murmur auscultated  Respiratory: clear to auscultation bilaterally    Neurological examination  Mentation: Alert oriented to time, place, history taking. Follows all commands speech and language fluent Cranial nerve II-XII: Pupils were equal round reactive to light. Extraocular movements were full, visual field were full on confrontational test. Facial sensation and strength were normal. Uvula tongue midline. Head turning and shoulder shrug  were normal and symmetric. Motor: The motor testing reveals 5 over 5 strength of all 4 extremities. Good symmetric motor tone is noted throughout.  Gait and station: Gait is normal.    DIAGNOSTIC DATA (LABS, IMAGING, TESTING) - I reviewed patient records, labs, notes, testing and imaging myself where available.  Lab Results  Component Value Date   WBC 7.1 10/20/2023   HGB 12.5 10/20/2023   HCT 39.2 10/20/2023   MCV 78 (L) 10/20/2023   PLT 229  10/20/2023      Component Value Date/Time   NA 143 10/20/2023 1027   K 3.7 10/20/2023 1027   CL 105 10/20/2023 1027   CO2 21 10/20/2023 1027   GLUCOSE 95 10/20/2023 1027   GLUCOSE 104 (H) 02/12/2022 1216   BUN 13 10/20/2023 1027   CREATININE 0.89 10/20/2023 1027   CREATININE 0.84 09/26/2021 1524   CALCIUM 10.0 10/20/2023 1027   PROT 6.6 10/20/2023 1027   ALBUMIN 4.3 10/20/2023 1027   AST 17 10/20/2023 1027   ALT 15 10/20/2023 1027   ALKPHOS 89 10/20/2023 1027   BILITOT 0.2 10/20/2023 1027   GFRNONAA >60 02/12/2022 1216   GFRNONAA 56 (L) 07/26/2015 0934   GFRAA >60 10/11/2019 1440   GFRAA 64 07/26/2015 0934   Lab Results  Component Value Date   CHOL 147 06/01/2019   HDL 65 06/01/2019   LDLCALC 66 06/01/2019   TRIG 79 06/01/2019   CHOLHDL 2.3 06/01/2019   Lab Results  Component Value Date   HGBA1C 5.6 08/21/2021   No results found for: [id number] Lab Results  Component Value Date   TSH 0.454 09/24/2022        No data to display               No data to display           ASSESSMENT AND PLAN  78 y.o. year old female  has a past medical history of Adenomatous colon polyp (2006), Anxiety, Anxiety and depression, Asthma, Breast mass, Breast tumor, Chest discomfort, Chronic back pain, Chronic constipation, Collagen vascular disease, Complication of anesthesia, Depression, Diverticulosis, Fibromyalgia, Gastroesophageal reflux disease, Hiatal hernia, History of benign esophageal tumor (2010), Hyperlipidemia, Hypertension, Insomnia, Migraines, PONV (postoperative nausea and vomiting), Psychosis (HCC), Renal insufficiency, Sleep apnea, Thyroid  nodule, and Tumor of esophagus. here with    Migraine with aura and without status migrainosus, not intractable  Insomnia, unspecified type  History of depression  Marda E Adelstein reports daily headaches continue. She reports never taking Emgality  as previous charted. She reports it was not covered. We will try  Emgality  every 30 days  and continue Nurtec as needed for abortive therapy. I will add low dose nortriptyline 10mg  daily at bedtime. Consider Vyepti infusions if Emgality  not effective. Discussed appropriate dosing for abortive medications. Healthy lifestyle habits encouraged. Sleep hygiene discussed. Continue discussion with PCP. She will follow up with PCP as directed. She will return to see me in 6 months, sooner if needed. She verbalizes understanding and agreement with this plan.   No orders of the defined types were placed in this encounter.    Meds ordered this encounter  Medications   Galcanezumab -gnlm (EMGALITY ) 120 MG/ML SOAJ    Sig: Inject 120 mg into the skin every 30 (thirty) days.    Dispense:  2 mL    Refill:  0    Supervising Provider:   YAN, YIJUN [[id number]]   nortriptyline (PAMELOR) 10 MG capsule    Sig: Take 1 capsule (10 mg total) by mouth at bedtime.    Dispense:  90 capsule    Refill:  3    Supervising Provider:   YAN, YIJUN [[id number]]    I spent 30 minutes of face-to-face and non-face-to-face time with patient.  This included previsit chart review, lab review, study review, order entry, electronic health record documentation, patient education.   Greig Forbes, MSN, FNP-C 04/19/2024, 4:25 PM  [facility] [address] [place], [place] [address] [phone]

## 2024-04-19 ENCOUNTER — Ambulatory Visit: Admitting: Family Medicine

## 2024-04-19 ENCOUNTER — Encounter: Payer: Self-pay | Admitting: Family Medicine

## 2024-04-19 VITALS — BP 128/78 | HR 77 | Ht 62.0 in | Wt 201.5 lb

## 2024-04-19 DIAGNOSIS — G4700 Insomnia, unspecified: Secondary | ICD-10-CM

## 2024-04-19 DIAGNOSIS — G43109 Migraine with aura, not intractable, without status migrainosus: Secondary | ICD-10-CM

## 2024-04-19 DIAGNOSIS — Z8659 Personal history of other mental and behavioral disorders: Secondary | ICD-10-CM

## 2024-04-19 MED ORDER — NORTRIPTYLINE HCL 10 MG PO CAPS: 10.0000 mg | ORAL_CAPSULE | Freq: Every day | ORAL | 3 refills | Status: AC

## 2024-04-19 MED ORDER — EMGALITY 120 MG/ML ~~LOC~~ SOAJ: 120.0000 mg | SUBCUTANEOUS | 0 refills | Status: DC

## 2024-04-21 ENCOUNTER — Encounter: Payer: Self-pay | Admitting: Gastroenterology

## 2024-04-21 ENCOUNTER — Telehealth: Payer: Self-pay | Admitting: Gastroenterology

## 2024-04-21 ENCOUNTER — Ambulatory Visit: Admitting: Gastroenterology

## 2024-04-21 ENCOUNTER — Other Ambulatory Visit: Payer: Self-pay | Admitting: *Deleted

## 2024-04-21 VITALS — BP 136/78 | HR 81 | Temp 98.2°F | Ht 62.0 in | Wt 199.0 lb

## 2024-04-21 DIAGNOSIS — K449 Diaphragmatic hernia without obstruction or gangrene: Secondary | ICD-10-CM

## 2024-04-21 DIAGNOSIS — K219 Gastro-esophageal reflux disease without esophagitis: Secondary | ICD-10-CM

## 2024-04-21 DIAGNOSIS — K5909 Other constipation: Secondary | ICD-10-CM

## 2024-04-21 DIAGNOSIS — R140 Abdominal distension (gaseous): Secondary | ICD-10-CM

## 2024-04-21 DIAGNOSIS — R0602 Shortness of breath: Secondary | ICD-10-CM

## 2024-04-21 DIAGNOSIS — K5900 Constipation, unspecified: Secondary | ICD-10-CM | POA: Diagnosis not present

## 2024-04-21 DIAGNOSIS — R1312 Dysphagia, oropharyngeal phase: Secondary | ICD-10-CM

## 2024-04-21 DIAGNOSIS — R1013 Epigastric pain: Secondary | ICD-10-CM

## 2024-04-21 DIAGNOSIS — R1310 Dysphagia, unspecified: Secondary | ICD-10-CM

## 2024-04-21 NOTE — Addendum Note (Signed)
 Addended by: GAYLENE MADELIN CROME on: 04/21/2024 01:32 PM   Modules accepted: Orders

## 2024-04-21 NOTE — Telephone Encounter (Signed)
 Spoke to [facility] and was informed that Dr. Mavis is not performing hiatal hernia repairs anymore unless it is related to the TIF procedure.   That is not the case with Ms. Persing therefore we will need to send her to [facility] surgery.  Please send that referral and notify the patient what we are doing.  Charmaine Melia, MSN, APRN, FNP-BC, AGACNP-BC [facility]

## 2024-04-21 NOTE — Patient Instructions (Addendum)
 We will get you scheduled for imaging study to evaluate the hiatal hernia further.  I will also go ahead and send a referral to Dr. Mavis for you -I have made a comment for the order to wait to schedule you until after the imaging study has been performed now and the results are available.  If you decide any point you do not like to follow-up with Dr. Mavis please let me know and we can refer you to [facility] surgery in [place].  Continue taking pantoprazole  40 mg twice daily and famotidine  at bedtime.  Continue taking your magnesium  and drinking her protein shakes to control your constipation.  Continue to avoid your certain foods that trigger difficulty swallowing. Given this large hiatal hernia you would benefit from smaller more frequent meals and make sure you are staying upright for at least an hour prior to laying down or reclining.  Many of these diet and lifestyle modifications for management of the hernia are the same as reflux treatments, I have attached a handout for you today that gives a better diagram into what is going on with the hiatal hernia and how to help manage symptoms until you see surgery.   It was a pleasure to see you today. I want to create trusting relationships with patients. If you receive a survey regarding your visit,  I greatly appreciate you taking time to fill this out on paper or through your MyChart. I value your feedback.  Charmaine Melia, MSN, FNP-BC, AGACNP-BC [facility]

## 2024-04-21 NOTE — Progress Notes (Signed)
 GI Office Note    Referring Provider: Shona Norleen PEDLAR, MD Primary Care Physician:  Shona Norleen PEDLAR, MD Primary Gastroenterologist: Lamar HERO.Rourk, MD  Date:  04/21/2024  ID:  [name], DOB 06/02/1946, MRN [id number]   Chief Complaint   Chief Complaint  Patient presents with   Follow-up    Follow up. Having issues with stomach swelling after she eats.    History of Present Illness  [name] is a 78 y.o. female with a history of opioid-induced constipation, adenomatous colon polyps, GERD, esophageal and oropharyngeal dysphagia, esophageal variceal tumor s/p EMR in 2011 at [facility] presenting today with complaint of abdominal distention and bloating after meals.   Seen by Dr. Emeline at [facility] for uncontrolled GERD in 2014, had multiple EGDs, normal GES, and tried various PPIs.  Esophageal manometry was attempted but she was unable to tolerate the prep.  Colonoscopy January 2020 with adenomatous colon polyps.  No repeat due to age.   History of esophageal dysphagia with EGD in April 2021 with Schatzki's ring s/p dilation.     History of oropharyngeal dysphagia demonstrated multiple prior modified barium swallow studies.     Seen early 2023 for unintentional weight loss in 3-4 months of early satiety.  She underwent EGD in March 2023 during hospitalization for hypokalemia and AKI.  She had plaques in esophageal mucosa with cytology negative for yeast, medium sized hiatal hernia, grade a reflux esophagitis, Schatzki's ring s/p dilation and normal stomach and duodenum.  She subsequently underwent bedside swallow evaluation with speech therapy revealing oropharyngeal dysphagia with prolonged AP, uncoordinated with poor bolus cohesion, moderate oral residue after initial swallow.  She had very slow A-P transition with pures and suspected delayed swallow trigger without any signs of aspiration.  She was recommended following dysphagia 2/finely chopped meats.   Labs in March 2023 with  negative stool studies, CBC and CMP without significant abnormalities.  TSH low at 0.11.  Normal lipase.  His abdomen regarding issues with diarrhea patient is also having some epigastric pain as she had been off PPI due to confusion had to take medications at discharge.   Barium swallow study 09/27/2021 with prolonged oral transit with solids with impairment normal movement resulting piecemeal duplication with timely swallow trigger, trace vallecular and lateral channel residues after liquids which clear with repeat swallow.  She delayed transit of barium tablet into the distal esophagus which eventually cleared with liquid wash.  At this point she is recommended to have any diet textures that she would like, just needs to allow more time and put forth more effort with swallowing.  No ongoing therapy recommended.   OV 10/10/22.  Taking pantoprazole  40 mg twice daily and famotidine  at bedtime with well-controlled symptoms.  Having some emesis post coughing episodes and had completed azithromycin mycin treatment with some improvement.  Denies any nausea.  Lack of appetite since being sick with some weight loss.  Swallowing stable.  She is not drinking Trulance  but drinking 2 protein shakes daily which keeps her bowels moving.  Denied any melena or BRBPR.  She was advised to continue PPI and famotidine  nightly.  GERD diet/lifestyle reinforced, continue protein drinks.  Follow-up in 6 months.  OV October 2024 with myself.  Constipation doing well since being on protein shakes, going every day with myself bowel movements without any melena or BRBPR.  She does have intermittent abdominal pain that is random in the left mid abdomen which can last about an hour and then  goes away on its own.  No vomiting, does have some occasional nausea which she attributes to her medication, no significant GERD symptoms.  Dysphagia is stable and about the same.  Appetite is good, having issues with her new teeth.  Eating a variety of  consistencies.  No chest pain and no feelings of food getting stuck.  Advised pantoprazole  40 mg twice daily, famotidine  20 mg nightly, reflux diet and again reviewed prior swallow recommendations.  Continue to follow with ENT/allergist and continue protein shakes.  Discussed using pudding to take her medications.  Last OV April 2025 with Josette Centers, PA-C.  She reported worsening dysphagia symptoms for couple of months with increased difficulty getting foods to the back of her mouth and swallowing.  When she swallows she feels like the food passes normally but states she developed swelling in her upper abdomen with sensation of a tight band across.  This occurs no matter what she eats.  Last for couple of hours at a time.  Having postprandial nausea and early satiety.  Chronic GERD pretty well-controlled with pantoprazole  40 twice daily and famotidine  at bedtime.  Notably had been taking 400 mg ibuprofen daily for 2 months and was started on Nurtec and Ajovy .  No complaints of constipation.  CBC, CMP, and lipase ordered for evaluation of her upper abdominal pain.  Discussed having upper endoscopy with dilation given her worsening swallowing and upper abdominal pain.  Advised to avoid NSAIDs and continue her PPI and famotidine .  EGD May 2025: - Normal esophagus - Nonobstructing Schatzki's ring s/p dilation - Large hiatal hernia - Normal duodenal bulb and second portion of duodenum - No specimens collected - Advised to follow-up in 6 months.  Today:  Discussed the use of AI scribe software for clinical note transcription with the patient, who gave verbal consent to proceed.  She experiences abdominal swelling localized to the upper abdomen, which worsens with eating or drinking, including water , and is associated with difficulty breathing. No overt abdominal pain other than the bloating and distention she experiences.   She has intermittent trouble with swallowing, although this has improved  recently, especially after her dilation. She is on a proton pump inhibitor twice daily and famotidine  nightly for reflux, which has not worsened. She takes magnesium  800 mg at bedtime to aid her bowel movements and consumes two Premier Boost shakes daily.  She experiences shortness of breath, particularly after eating. She uses Breztri and albuterol  for her respiratory issues.  She takes Nurtec and Emgality  for migraines, though she has not noticed significant improvement in her headaches. Also on nortriptyline.  She is trying to lose weight by exercising at the [facility] and reports stable blood sugars with no recent concerns from her healthcare providers.      Wt Readings from Last 6 Encounters:  04/21/24 199 lb (90.3 kg)  04/19/24 201 lb 8 oz (91.4 kg)  11/11/23 192 lb 3.2 oz (87.2 kg)  10/20/23 195 lb 6.4 oz (88.6 kg)  09/29/23 192 lb 8 oz (87.3 kg)  09/23/23 190 lb (86.2 kg)    Body mass index is 36.4 kg/m.   Current Outpatient Medications  Medication Sig Dispense Refill   albuterol  (PROVENTIL  HFA;VENTOLIN  HFA) 108 (90 BASE) MCG/ACT inhaler Inhale 2 puffs into the lungs every 6 (six) hours as needed for wheezing or shortness of breath.     amLODipine  (NORVASC ) 10 MG tablet Take 10 mg by mouth daily.     azelastine  (ASTELIN ) 0.1 % nasal spray 1-2  sprays each nostril twice daily as needed 30 mL 5   BREZTRI AEROSPHERE 160-9-4.8 MCG/ACT AERO Inhale into the lungs.     Calcium Carbonate-Vit D-Min (CALCIUM 1200 PO) Take by mouth daily.     carvedilol  (COREG ) 3.125 MG tablet TAKE ONE TABLET (3.125MG  TOTAL) BY MOUTHTWO TIMES DAILY WITH A MEAL 180 tablet 2   Cyanocobalamin (VITAMIN B12 PO) Take by mouth.     diphenhydrAMINE  HCl, Sleep, (ZZZQUIL PO) Take by mouth.     fluticasone  (FLONASE ) 50 MCG/ACT nasal spray Place 2 sprays into both nostrils daily. 48 g 1   furosemide  (LASIX ) 40 MG tablet TAKE ONE TABLET (40MG  TOTAL) BY MOUTH DAILY 90 tablet 1   hydrALAZINE  (APRESOLINE ) 100 MG tablet TAKE  ONE TABLET (100MG  TOTAL) BY MOUTH THREE TIMES DAILY 90 tablet 3   levocetirizine (XYZAL ) 5 MG tablet TAKE ONE TABLET (5MG  TOTAL) BY MOUTH TWOTIMES DAILY AS NEEDED FOR ALLERGIES OR ITCHING 30 tablet 5   levothyroxine (SYNTHROID) 25 MCG tablet Take 25 mcg by mouth daily.     lovastatin  (MEVACOR ) 40 MG tablet Take 40 mg by mouth at bedtime.     Melatonin 3 MG CAPS Take by mouth.     Multiple Vitamins-Minerals (CENTRUM SILVER 50+WOMEN PO) Take by mouth daily.     nortriptyline (PAMELOR) 10 MG capsule Take 1 capsule (10 mg total) by mouth at bedtime. 90 capsule 3   pantoprazole  (PROTONIX ) 40 MG tablet TAKE ONE TABLET (40MG  TOTAL) BY MOUTH TWO TIMES DAILY 60 tablet 5   Polyethylene Glycol 400 (BLINK TEARS OP) Apply to eye.     potassium chloride  SA (KLOR-CON  M) 20 MEQ tablet Take 20 mEq by mouth 2 (two) times daily.     pregabalin (LYRICA) 50 MG capsule Take 50 mg by mouth 2 (two) times daily.     Rimegepant Sulfate (NURTEC) 75 MG TBDP Take 1 tablet (75 mg total) by mouth daily as needed (take for abortive therapy of migraine, no more than 1 tablet in 24 hours or 10 per month). 8 tablet 11   Specialty Vitamins Products (BLINK NUTRITEARS PO) Take by mouth.     Galcanezumab -gnlm (EMGALITY ) 120 MG/ML SOAJ Inject 120 mg into the skin every 30 (thirty) days. (Patient not taking: Reported on 04/21/2024) 2 mL 0   No current facility-administered medications for this visit.    Past Medical History:  Diagnosis Date   Adenomatous colon polyp 2006   excised in 2006 & 2010Due surveillance 06/2013   Anxiety    Anxiety and depression    Asthma    Breast mass    right nipple bengn mass per patient   Breast tumor    Chest discomfort    Chronic back pain    Chronic constipation    Collagen vascular disease    Complication of anesthesia    Depression    Diverticulosis    Fibromyalgia    Gastroesophageal reflux disease    Hiatal hernia    History of benign esophageal tumor 2010   granular cell  esophageal tumor (Dx 06/2008), resected via EMR 2011, due repeat EGD 02/2012   Hyperlipidemia    Hypertension    Insomnia    Migraines    PONV (postoperative nausea and vomiting)    Psychosis (HCC)    Renal insufficiency    Sleep apnea    Stop Bang score of 5. Pt said she was told by Dr. Milton that she had a little bit of sleep apena, but not bad enough to  treat.   Thyroid  nodule    Tumor of esophagus     Past Surgical History:  Procedure Laterality Date   ABDOMINAL HYSTERECTOMY     BALLOON DILATION  08/23/2021   Procedure: BALLOON DILATION;  Surgeon: Cindie Carlin POUR, DO;  [facility];  Service: Endoscopy;;   BIOPSY  10/14/2019   Procedure: BIOPSY;  Surgeon: Shaaron Lamar HERO, MD;  [facility];  Service: Endoscopy;;   BRAVO PH STUDY  12/11/2011   Procedure: BRAVO PH STUDY;  Surgeon: Lamar HERO Shaaron, MD;  [facility];  Service: Endoscopy;  Laterality: N/A;   BREAST EXCISIONAL BIOPSY  1990s, 2012   Left x2-sclerosing ductal papilloma-2012   CHOLECYSTECTOMY N/A 04/09/2013   Procedure: LAPAROSCOPIC CHOLECYSTECTOMY;  Surgeon: Oneil DELENA Budge, MD;  Location: [facility];  Service: General;  Laterality: N/A;   COLONOSCOPY  06/2008, 06/2011   sigmoid tics, tubular adenoma; 2013: anal canal hemorrhoids   COLONOSCOPY  07/10/2011   Anal canal hemorrhoids likely the cause of hematochezia in the setting of constipation; otherwise normal rectum ;submucosal  petechiae in left colon of doubtful clinical significance; otherwise, normal colon   COLONOSCOPY WITH PROPOFOL  N/A 11/10/2017   cancelled in pre-op   COLONOSCOPY WITH PROPOFOL  N/A 07/23/2018   Procedure: COLONOSCOPY WITH PROPOFOL ;  Surgeon: Shaaron Lamar HERO, MD; 1 tubular adenoma, diverticulosis in the sigmoid and descending colon, nonbleeding internal hemorrhoids.  No recommendations to repeat due to age.   ESOPHAGEAL BRUSHING  08/23/2021   Procedure: ESOPHAGEAL BRUSHING;  Surgeon: Cindie Carlin POUR, DO;   [facility];  Service: Endoscopy;;   ESOPHAGEAL DILATION N/A 05/08/2015   Procedure: ESOPHAGEAL DILATION;  Surgeon: Lamar HERO Shaaron, MD;  Location: [facility];  Service: Endoscopy;  Laterality: N/AMERL Stai 54/56   ESOPHAGEAL DILATION N/A 11/13/2023   Procedure: DILATION, ESOPHAGUS;  Surgeon: Shaaron Lamar HERO, MD;  [facility];  Service: Endoscopy;  Laterality: N/A;   ESOPHAGOGASTRODUODENOSCOPY  12/11/2011   MFM:Wnwrmpuprjo Schatzki's ring; otherwise normal/Small hiatal hernia. Antral and bulbar erosions   ESOPHAGOGASTRODUODENOSCOPY N/A 11/13/2023   Procedure: EGD (ESOPHAGOGASTRODUODENOSCOPY);  Surgeon: Shaaron Lamar HERO, MD;  [facility];  Service: Endoscopy;  Laterality: N/A;  2:30 pm. asa 3, ok for rooms 1/2 per Tammy   ESOPHAGOGASTRODUODENOSCOPY (EGD) WITH PROPOFOL  N/A 05/08/2015   Dr. Shaaron: mild erosive reflux esophagitis, non-critical Schatzki's ring s/p dilation. Hiatal hernia.    ESOPHAGOGASTRODUODENOSCOPY (EGD) WITH PROPOFOL  N/A 10/14/2019   Procedure: ESOPHAGOGASTRODUODENOSCOPY (EGD) WITH PROPOFOL ;  Surgeon: Shaaron Lamar HERO, MD;  Nonobstructing Schatzki ring, small hiatal hernia, abnormal gastric mucosa s/p biopsied, normal first and second portion of the duodenum.  Pathology with chronic inactive gastritis, no H. pylori.   ESOPHAGOGASTRODUODENOSCOPY (EGD) WITH PROPOFOL  N/A 08/23/2021   Surgeon: Cindie Carlin POUR, DO;   white speckled mucosa in the esophagus s/p cells obtained for cytology (KOH prep negative), medium-sized hiatal hernia, grade A reflux esophagitis, mild Schatzki's ring s/p dilation, normal examined stomach and duodenum.   EUS  08/2010   Dr [facility] with EGD. Retained food. No recurrent esophageal lesion, bx negative.   IR KYPHO THORACIC WITH BONE BIOPSY  10/17/2020   IR RADIOLOGIST EVAL & MGMT  09/12/2020   LUNG BIOPSY     negative   PARATHYROIDECTOMY  09/2015   [facility].    PARATHYROIDECTOMY     POLYPECTOMY  07/23/2018   Procedure:  POLYPECTOMY;  Surgeon: Shaaron Lamar HERO, MD;  [facility];  Service: Endoscopy;;  colon  RIGHT OOPHORECTOMY     benign disease   SHOULDER ARTHROSCOPY     Right; bone spurs removed   TONSILLECTOMY     TOTAL KNEE ARTHROPLASTY  2002   Right; previous arthroscopic surgery    Family History  Problem Relation Age of Onset   Heart attack Mother    Depression Mother    Anxiety disorder Mother    Migraines Mother    Stroke Father    Alcohol abuse Father    Migraines Father    Dementia Maternal Uncle    Colon cancer Paternal Aunt    Colon cancer Paternal Uncle    ADD / ADHD Neg Hx    Bipolar disorder Neg Hx    Drug abuse Neg Hx    OCD Neg Hx    Paranoid behavior Neg Hx    Schizophrenia Neg Hx    Seizures Neg Hx    Sexual abuse Neg Hx    Physical abuse Neg Hx     Allergies as of 04/21/2024 - Review Complete 04/21/2024  Allergen Reaction Noted   Elavil  [amitriptyline ] Other (See Comments) 06/03/2012   Abilify [aripiprazole] Other (See Comments) 11/25/2012   Codeine Hives, Nausea Only, and Other (See Comments)    Latex Hives 11/21/2011   Losartan   09/17/2022   Penicillins Hives, Itching, and Other (See Comments)    Polyethylene glycol Other (See Comments) 12/26/2012   Spironolactone   05/14/2022   Sulfonamide derivatives Nausea And Vomiting 11/25/2022   Cymbalta  [duloxetine  hcl] Rash 03/10/2013   Remeron [mirtazapine] Other (See Comments) 05/06/2012    Social History   Socioeconomic History   Marital status: Single    Spouse name: Not on file   Number of children: 1   Years of education: Not on file   Highest education level: Not on file  Occupational History   Occupation: disabled    Employer: RETIRED  Tobacco Use   Smoking status: Never   Smokeless tobacco: Never  Vaping Use   Vaping status: Never Used  Substance and Sexual Activity   Alcohol use: No    Alcohol/week: 0.0 standard drinks of alcohol   Drug use: No   Sexual activity: Never  Other  Topics Concern   Not on file  Social History Narrative   Pt lives with daughter    Retired    Chief Executive Officer Drivers of [employer] Strain: Not on file  Food Insecurity: Not on file  Transportation Needs: Not on file  Physical Activity: Not on file  Stress: Not on file  Social Connections: Not on file    Review of Systems   Gen: Denies fever, chills, anorexia. Denies fatigue, weakness, weight loss.  CV: Denies chest pain, palpitations, syncope, peripheral edema, and claudication. Resp: Denies dyspnea at rest, cough, wheezing, coughing up blood, and pleurisy. GI: See HPI Derm: Denies rash, itching, dry skin Psych: Denies depression, anxiety, memory loss, confusion. No homicidal or suicidal ideation.  Heme: Denies bruising, bleeding, and enlarged lymph nodes.  Physical Exam   BP 136/78 (BP Location: Right Arm, Patient Position: Sitting, Cuff Size: Large)   Pulse 81   Temp 98.2 F (36.8 C) (Temporal)   Ht 5' 2 (1.575 m)   Wt 199 lb (90.3 kg)   BMI 36.40 kg/m   General:   Alert and oriented. No distress noted. Pleasant and cooperative.  Head:  Normocephalic and atraumatic. Eyes:  Conjuctiva clear without scleral icterus. Abdomen:  +BS, soft, non-distended. Ttp to epigastrium, LUQ No rebound or guarding.  No HSM or masses noted. Rectal: deferred Msk:  Symmetrical without gross deformities. Normal posture. Extremities:  Without edema. Neurologic:  Alert and  oriented x4 Psych:  Alert and cooperative. Normal mood and affect.  Assessment & Plan  AVIGAIL PILLING is a 78 y.o. female presenting today with complaints of postprandial upper abdominal bloating/distention.    Large hiatal hernia with postprandial bloating and abdominal distention The large hiatal hernia has increased in size, causing part of the stomach to protrude into the chest cavity, leading to postprandial bloating, abdominal distention, shortness of breath, and a feeling of heaviness. Epigastrium and  substernal tenderness on exam. Surgical intervention is the only definitive treatment to reposition the stomach and apply mesh to prevent recurrence. Advised that weight loss could improve symptoms as well but given she is symptomatic with chest discomfort and shortness of breath that surgical consultation is recommended.  - Order upper GI series to further assess the hernia - Refer to Dr. Mavis for surgical consultation,  - Send imaging results to Dr. Mavis or [facility] for review - Consider referral to [facility] if not satisfied with local consultation or if local office declines.  - Advised smaller more frequent meals - Pending decision regarding surgery, could consider eval for SIBO, SIMO given the bloating post meals.   Dysphagia (oropharyngeal and esophageal) status post esophageal dilation Dysphagia has improved following esophageal dilation for Schatzki's ring, though swallowing certain medications, such as potassium, remains difficult. Large hiatal hernia may still contribute to intermittent swallowing issues. Has well documented confirmation of both esophageal and oropharyngeal dysphagia. Oropharyngeal dysphagia has been the primary etiology of her symptoms in the past.  - Advise using pudding or ice cream to aid in swallowing potassium tablets - Continue to avoid trigger foods and textures - Small bites and alternating with sips of liquids - Remaining upright for at least 30-60 minutes after meals.   Constipation Constipation is well-managed with magnesium  supplementation, which also aids in headache management. She is taking 800 mg of magnesium  at bedtime, which has improved bowel movements. Bowel movements have also been well controlled with the consumption of her premier boost protein shakes which she has continued.  - Continue magnesium  800 mg at bedtime - Encourage exercise to aid bowel movements - Continue protein shakes.   Gastroesophageal reflux  disease Gastroesophageal reflux disease symptoms have improved, and currently well controlled on current regimen or pantoprazole  40 mg BID and famotidine  nightly.  No recent exacerbations reported. - Continue current management with pantoprazole  and famotidine  - GERD diet      Follow up   Follow up 4 months.     Charmaine Melia, MSN, FNP-BC, AGACNP-BC [facility]

## 2024-04-21 NOTE — Telephone Encounter (Signed)
 Pt informed of providers message. Pt was informed will be referred to [facility] and they will contact her. Verbalized understanding.

## 2024-04-22 ENCOUNTER — Other Ambulatory Visit: Payer: Self-pay | Admitting: Cardiology

## 2024-04-26 ENCOUNTER — Ambulatory Visit (INDEPENDENT_AMBULATORY_CARE_PROVIDER_SITE_OTHER): Admitting: Orthopedic Surgery

## 2024-04-26 ENCOUNTER — Encounter: Payer: Self-pay | Admitting: Orthopedic Surgery

## 2024-04-26 ENCOUNTER — Other Ambulatory Visit (INDEPENDENT_AMBULATORY_CARE_PROVIDER_SITE_OTHER): Payer: Self-pay

## 2024-04-26 DIAGNOSIS — G8929 Other chronic pain: Secondary | ICD-10-CM

## 2024-04-26 DIAGNOSIS — M7052 Other bursitis of knee, left knee: Secondary | ICD-10-CM | POA: Diagnosis not present

## 2024-04-26 DIAGNOSIS — M1712 Unilateral primary osteoarthritis, left knee: Secondary | ICD-10-CM

## 2024-04-26 DIAGNOSIS — M7742 Metatarsalgia, left foot: Secondary | ICD-10-CM | POA: Diagnosis not present

## 2024-04-26 DIAGNOSIS — Z96651 Presence of right artificial knee joint: Secondary | ICD-10-CM

## 2024-04-26 DIAGNOSIS — M7741 Metatarsalgia, right foot: Secondary | ICD-10-CM

## 2024-04-26 MED ORDER — METHYLPREDNISOLONE ACETATE 40 MG/ML IJ SUSP
40.0000 mg | Freq: Once | INTRAMUSCULAR | Status: AC
Start: 2024-04-26 — End: 2024-04-26
  Administered 2024-04-26: 40 mg via INTRA_ARTICULAR

## 2024-04-26 NOTE — Progress Notes (Signed)
    04/26/2024   Chief Complaint  Patient presents with   Knee Pain    Left     Encounter Diagnoses  Name Primary?   Chronic pain of left knee Yes   Primary osteoarthritis of left knee     What pharmacy do you use ? _____[place] [facility] ______________________  DOI/DOS/ Date: ongoing  Did you get better, worse or no change (Answer below)   Worse feels like fluid in left knee

## 2024-04-26 NOTE — Progress Notes (Signed)
    Chief Complaint  Patient presents with   Knee Pain    Left     History this is a 78 year old female with previous history of osteoarthritis left knee had a right total knee arthroplasty in the past comes in with recurrent pain swelling in her left knee joint.  We had not taken an x-ray since 2025 today's x-ray shows  DG Knee AP/LAT W/Sunrise Left Result Date: 04/26/2024 Images of the left knee last x-ray was in 2025 Recurrent left knee pain is the chief complaint The axial view of the patella shows perhaps some slight subluxation with patellofemoral arthritis joint space narrowing subchondral cyst formation sclerosis and periarticular osteophytes This is also seen very well on the lateral x-ray with significant and severe joint space narrowing As the x-ray includes the right knee which has a total knee is in good position the left knee shows valgus alignment narrowing of the medial compartment, periarticular osteophytes Arthritis grade 3 tibiofemoral joint patellofemoral is probably grade 4     Examination of the left knee She is tender over the medial soft tissues of the left knee Stability tests were normal No joint effusion Range of motion the knee is flat on the floor we have some 0 degree range of motion in terms of extension with 105 degrees of flexion  Encounter Diagnoses  Name Primary?   Chronic pain of left knee Yes   Primary osteoarthritis of left knee    Metatarsalgia of both feet    Pes anserinus bursitis of left knee    Recommend  injection for the bursitis  Metatarsal pads   Return as needed   Procedure  Procedure note for injection   Chief Complaint  Patient presents with   Knee Pain    Left      Encounter Diagnoses  Name Primary?   Chronic pain of left knee Yes   Primary osteoarthritis of left knee    Metatarsalgia of both feet    Pes anserinus bursitis of left knee         The patient has consented for injection of the left pes bursa of  the left Joint: knee  Medication: Depo-Medrol  40 mg and lidocaine  1%  Time out completed: Yes  The site of injection was cleaned with alcohol and ethyl chloride.  The injection was given without any complications appropriate precautions were given.

## 2024-04-28 ENCOUNTER — Ambulatory Visit (HOSPITAL_COMMUNITY)
Admission: RE | Admit: 2024-04-28 | Discharge: 2024-04-28 | Disposition: A | Discharge status: Home / Self Care | Source: Ambulatory Visit | Attending: Gastroenterology | Admitting: Gastroenterology

## 2024-04-28 ENCOUNTER — Ambulatory Visit: Payer: Self-pay | Admitting: Gastroenterology

## 2024-04-28 DIAGNOSIS — R1013 Epigastric pain: Secondary | ICD-10-CM | POA: Insufficient documentation

## 2024-04-28 DIAGNOSIS — K449 Diaphragmatic hernia without obstruction or gangrene: Secondary | ICD-10-CM | POA: Diagnosis present

## 2024-04-28 LAB — LAB REPORT - SCANNED
A1c: 5.7
Creatinine, POC: 10.1 mg/dL
EGFR: 67

## 2024-05-31 ENCOUNTER — Other Ambulatory Visit: Payer: Self-pay

## 2024-05-31 ENCOUNTER — Telehealth: Payer: Self-pay | Admitting: Family Medicine

## 2024-05-31 MED ORDER — EMGALITY 120 MG/ML ~~LOC~~ SOAJ: 120.0000 mg | SUBCUTANEOUS | 0 refills | Status: DC

## 2024-05-31 NOTE — Telephone Encounter (Signed)
 Pt is requesting a refill for Galcanezumab -gnlm (EMGALITY ) 120 MG/ML SOAJ.  Pharmacy: [facility]

## 2024-05-31 NOTE — Telephone Encounter (Signed)
 Sent refill to pharmacy.

## 2024-06-11 ENCOUNTER — Ambulatory Visit: Attending: Student | Admitting: Student

## 2024-06-11 ENCOUNTER — Encounter: Payer: Self-pay | Admitting: Student

## 2024-06-11 VITALS — BP 132/78 | HR 67 | Ht 62.0 in | Wt 202.8 lb

## 2024-06-11 DIAGNOSIS — I5032 Chronic diastolic (congestive) heart failure: Secondary | ICD-10-CM | POA: Diagnosis not present

## 2024-06-11 DIAGNOSIS — E782 Mixed hyperlipidemia: Secondary | ICD-10-CM | POA: Diagnosis not present

## 2024-06-11 DIAGNOSIS — R002 Palpitations: Secondary | ICD-10-CM | POA: Diagnosis not present

## 2024-06-11 DIAGNOSIS — I10 Essential (primary) hypertension: Secondary | ICD-10-CM

## 2024-06-11 NOTE — Progress Notes (Unsigned)
" ° °  Cardiology Office Note    Date:  06/11/2024  ID:  [name], DOB 1945/07/23, MRN [id number] Cardiologist: Alvan Carrier, MD { :  History of Present Illness:    [name] is a 78 y.o. female with past medical history of chronic HFpEF, palpitations, HTN and HLD who presents to the office today for 19-month follow-up.  She was last examined by Dr. Alvan in 10/2023 and reported having chronic dyspnea on exertion which had improved with use of her inhaler.  Denied any lower extremity edema and blood pressure had overall been well-controlled.  It was felt that her dyspnea on exertion was possibly related to asthma or inactivity.  Given recent echocardiogram and stress test, there were no plans for further cardiac testing.  She was continued on her current cardiac medications with amlodipine  10 mg daily, Coreg  3.125 mg twice daily, Lasix  40 mg daily, hydralazine  100 mg 3 times daily, lovastatin  40 mg daily and potassium supplementation.  In talk with the patient and her daughter today, she reports her biggest issue over the past several months has been difficulty swallowing food.  She has to consume soft food due to known esophageal dysmotility.  Does report having occasional episodes of chest discomfort but they typically occur at night and last for a few seconds and spontaneously resolved.  No association with exertion.  She has baseline dyspnea on exertion but no acute changes in this.  No specific orthopnea or PND.  She does experience intermittent lower extremity edema and takes Lasix  a few days per week.  Does not take daily due to frequent urination.  Studies Reviewed:   EKG: EKG is*** ordered today and demonstrates ***   EKG Interpretation Date/Time:    Ventricular Rate:    PR Interval:    QRS Duration:    QT Interval:    QTC Calculation:   R Axis:      Text Interpretation:           Risk Assessment/Calculations:   {Does this patient have ATRIAL  FIBRILLATION?:[phone]} No BP recorded.  {Refresh Note OR Click here to enter BP  :1}***         Physical Exam:   VS:  There were no vitals taken for this visit.   Wt Readings from Last 3 Encounters:  04/21/24 199 lb (90.3 kg)  04/19/24 201 lb 8 oz (91.4 kg)  11/11/23 192 lb 3.2 oz (87.2 kg)     GEN: Well nourished, well developed in no acute distress NECK: No JVD; No carotid bruits CARDIAC: ***RRR, no murmurs, rubs, gallops RESPIRATORY:  Clear to auscultation without rales, wheezing or rhonchi  ABDOMEN: Appears non-distended. No obvious abdominal masses. EXTREMITIES: No clubbing or cyanosis. No edema.  Distal pedal pulses are 2+ bilaterally.   Assessment and Plan:      {Are you ordering a CV Procedure (e.g. stress test, cath, DCCV, TEE, etc)?   Press F2        :[id number]}   Signed, Laymon CHRISTELLA Qua, PA-C   "

## 2024-06-11 NOTE — Patient Instructions (Signed)
 Medication Instructions:  Your physician recommends that you continue on your current medications as directed. Please refer to the Current Medication list given to you today.  *If you need a refill on your cardiac medications before your next appointment, please call your pharmacy*  Lab Work: NONE   If you have labs (blood work) drawn today and your tests are completely normal, you will receive your results only by: MyChart Message (if you have MyChart) OR A paper copy in the mail If you have any lab test that is abnormal or we need to change your treatment, we will call you to review the results.  Testing/Procedures: NONE   Follow-Up: At [facility], you and your health needs are our priority.  As part of our continuing mission to provide you with exceptional heart care, our providers are all part of one team.  This team includes your primary Cardiologist (physician) and Advanced Practice Providers or APPs (Physician Assistants and Nurse Practitioners) who all work together to provide you with the care you need, when you need it.  Your next appointment:   6 month(s)  Provider:   Armida Lander, MD    We recommend signing up for the patient portal called "MyChart".  Sign up information is provided on this After Visit Summary.  MyChart is used to connect with patients for Virtual Visits (Telemedicine).  Patients are able to view lab/test results, encounter notes, upcoming appointments, etc.  Non-urgent messages can be sent to your provider as well.   To learn more about what you can do with MyChart, go to [email or web address].   Other Instructions Thank you for choosing [facility]!

## 2024-06-12 ENCOUNTER — Encounter: Payer: Self-pay | Admitting: Student

## 2024-06-25 ENCOUNTER — Ambulatory Visit: Payer: 59 | Admitting: Orthopedic Surgery

## 2024-07-13 ENCOUNTER — Other Ambulatory Visit: Payer: Self-pay

## 2024-07-13 MED ORDER — EMGALITY 120 MG/ML ~~LOC~~ SOAJ: 120.0000 mg | SUBCUTANEOUS | 0 refills | Status: AC

## 2024-10-04 ENCOUNTER — Ambulatory Visit: Admitting: Internal Medicine

## 2024-11-09 ENCOUNTER — Ambulatory Visit: Admitting: Family Medicine

## 2024-11-17 ENCOUNTER — Ambulatory Visit
